# Patient Record
Sex: Male | Born: 1960 | Race: White | Hispanic: No | Marital: Married | Smoking: Never smoker
Health system: Southern US, Community
[De-identification: ages and names within clinical notes are randomized; demographics above are authoritative.]

## PROBLEM LIST (undated history)

## (undated) DIAGNOSIS — Z9221 Personal history of antineoplastic chemotherapy: Secondary | ICD-10-CM

## (undated) DIAGNOSIS — T4145XA Adverse effect of unspecified anesthetic, initial encounter: Secondary | ICD-10-CM

## (undated) DIAGNOSIS — N189 Chronic kidney disease, unspecified: Secondary | ICD-10-CM

## (undated) DIAGNOSIS — Z941 Heart transplant status: Secondary | ICD-10-CM

## (undated) DIAGNOSIS — C4490 Unspecified malignant neoplasm of skin, unspecified: Secondary | ICD-10-CM

## (undated) DIAGNOSIS — C8190 Hodgkin lymphoma, unspecified, unspecified site: Secondary | ICD-10-CM

## (undated) DIAGNOSIS — T8859XA Other complications of anesthesia, initial encounter: Secondary | ICD-10-CM

## (undated) DIAGNOSIS — F329 Major depressive disorder, single episode, unspecified: Secondary | ICD-10-CM

## (undated) DIAGNOSIS — M5136 Other intervertebral disc degeneration, lumbar region: Secondary | ICD-10-CM

## (undated) DIAGNOSIS — C189 Malignant neoplasm of colon, unspecified: Secondary | ICD-10-CM

## (undated) DIAGNOSIS — E039 Hypothyroidism, unspecified: Secondary | ICD-10-CM

## (undated) DIAGNOSIS — Z87442 Personal history of urinary calculi: Secondary | ICD-10-CM

## (undated) DIAGNOSIS — L989 Disorder of the skin and subcutaneous tissue, unspecified: Secondary | ICD-10-CM

## (undated) DIAGNOSIS — I4891 Unspecified atrial fibrillation: Secondary | ICD-10-CM

## (undated) DIAGNOSIS — M109 Gout, unspecified: Secondary | ICD-10-CM

## (undated) DIAGNOSIS — IMO0001 Reserved for inherently not codable concepts without codable children: Secondary | ICD-10-CM

## (undated) DIAGNOSIS — F32A Depression, unspecified: Secondary | ICD-10-CM

## (undated) DIAGNOSIS — K219 Gastro-esophageal reflux disease without esophagitis: Secondary | ICD-10-CM

## (undated) DIAGNOSIS — Z923 Personal history of irradiation: Secondary | ICD-10-CM

## (undated) DIAGNOSIS — M51369 Other intervertebral disc degeneration, lumbar region without mention of lumbar back pain or lower extremity pain: Secondary | ICD-10-CM

## (undated) DIAGNOSIS — J189 Pneumonia, unspecified organism: Secondary | ICD-10-CM

## (undated) DIAGNOSIS — R5382 Chronic fatigue, unspecified: Secondary | ICD-10-CM

## (undated) DIAGNOSIS — M5126 Other intervertebral disc displacement, lumbar region: Secondary | ICD-10-CM

## (undated) DIAGNOSIS — D494 Neoplasm of unspecified behavior of bladder: Secondary | ICD-10-CM

## (undated) HISTORY — PX: CARDIAC CATHETERIZATION: SHX172

## (undated) HISTORY — DX: Depression, unspecified: F32.A

## (undated) HISTORY — DX: Hypothyroidism, unspecified: E03.9

## (undated) HISTORY — DX: Pneumonia, unspecified organism: J18.9

## (undated) HISTORY — PX: PORTACATH PLACEMENT: SHX2246

## (undated) HISTORY — PX: OTHER SURGICAL HISTORY: SHX169

## (undated) HISTORY — DX: Unspecified atrial fibrillation: I48.91

## (undated) HISTORY — DX: Gastro-esophageal reflux disease without esophagitis: K21.9

## (undated) HISTORY — PX: TONSILLECTOMY: SUR1361

## (undated) HISTORY — DX: Hodgkin lymphoma, unspecified, unspecified site: C81.90

## (undated) HISTORY — PX: ESOPHAGOGASTRODUODENOSCOPY: SHX1529

## (undated) HISTORY — PX: EXPLORATORY LAPAROTOMY: SUR591

## (undated) HISTORY — DX: Major depressive disorder, single episode, unspecified: F32.9

---
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
## 1980-12-01 DIAGNOSIS — Z923 Personal history of irradiation: Secondary | ICD-10-CM

## 1980-12-01 HISTORY — PX: EXPLORATORY LAPAROTOMY: SUR591

## 1980-12-01 HISTORY — DX: Personal history of irradiation: Z92.3

## 1998-09-14 ENCOUNTER — Ambulatory Visit (HOSPITAL_COMMUNITY): Admission: RE | Admit: 1998-09-14 | Discharge: 1998-09-14 | Payer: Self-pay | Admitting: Cardiology

## 1998-12-11 ENCOUNTER — Ambulatory Visit (HOSPITAL_COMMUNITY): Admission: RE | Admit: 1998-12-11 | Discharge: 1998-12-11 | Payer: Self-pay | Admitting: Family Medicine

## 1999-06-14 ENCOUNTER — Encounter (INDEPENDENT_AMBULATORY_CARE_PROVIDER_SITE_OTHER): Payer: Self-pay | Admitting: Specialist

## 1999-06-14 ENCOUNTER — Other Ambulatory Visit: Admission: RE | Admit: 1999-06-14 | Discharge: 1999-06-14 | Payer: Self-pay | Admitting: Gastroenterology

## 2000-05-04 ENCOUNTER — Ambulatory Visit (HOSPITAL_COMMUNITY): Admission: RE | Admit: 2000-05-04 | Discharge: 2000-05-04 | Payer: Self-pay | Admitting: Internal Medicine

## 2000-05-13 ENCOUNTER — Ambulatory Visit (HOSPITAL_COMMUNITY): Admission: RE | Admit: 2000-05-13 | Discharge: 2000-05-13 | Payer: Self-pay | Admitting: Internal Medicine

## 2000-12-18 ENCOUNTER — Ambulatory Visit (HOSPITAL_COMMUNITY): Admission: RE | Admit: 2000-12-18 | Discharge: 2000-12-18 | Payer: Self-pay | Admitting: Cardiology

## 2000-12-18 ENCOUNTER — Encounter: Payer: Self-pay | Admitting: Cardiology

## 2002-07-14 ENCOUNTER — Encounter: Payer: Self-pay | Admitting: Cardiology

## 2002-07-14 ENCOUNTER — Inpatient Hospital Stay (HOSPITAL_COMMUNITY): Admission: AD | Admit: 2002-07-14 | Discharge: 2002-07-15 | Payer: Self-pay | Admitting: Cardiology

## 2002-08-25 ENCOUNTER — Ambulatory Visit (HOSPITAL_COMMUNITY): Admission: RE | Admit: 2002-08-25 | Discharge: 2002-08-26 | Payer: Self-pay | Admitting: Internal Medicine

## 2002-08-26 ENCOUNTER — Encounter: Payer: Self-pay | Admitting: Internal Medicine

## 2002-11-15 ENCOUNTER — Ambulatory Visit: Admission: RE | Admit: 2002-11-15 | Discharge: 2002-11-15 | Payer: Self-pay | Admitting: Cardiology

## 2002-11-22 ENCOUNTER — Ambulatory Visit: Admission: RE | Admit: 2002-11-22 | Discharge: 2002-11-22 | Payer: Self-pay | Admitting: Gastroenterology

## 2003-02-21 ENCOUNTER — Ambulatory Visit: Admission: RE | Admit: 2003-02-21 | Discharge: 2003-02-21 | Payer: Self-pay | Admitting: Gastroenterology

## 2003-03-02 ENCOUNTER — Ambulatory Visit: Admission: RE | Admit: 2003-03-02 | Discharge: 2003-03-02 | Payer: Self-pay | Admitting: Cardiology

## 2003-11-23 ENCOUNTER — Ambulatory Visit: Admission: RE | Admit: 2003-11-23 | Discharge: 2003-11-23 | Payer: Self-pay | Admitting: Pulmonary Disease

## 2003-12-18 ENCOUNTER — Encounter (INDEPENDENT_AMBULATORY_CARE_PROVIDER_SITE_OTHER): Payer: Self-pay | Admitting: Specialist

## 2003-12-18 ENCOUNTER — Ambulatory Visit (HOSPITAL_COMMUNITY): Admission: RE | Admit: 2003-12-18 | Discharge: 2003-12-18 | Payer: Self-pay | Admitting: Surgery

## 2004-10-03 ENCOUNTER — Ambulatory Visit: Payer: Self-pay

## 2004-10-23 ENCOUNTER — Ambulatory Visit: Payer: Self-pay | Admitting: Family Medicine

## 2004-10-30 ENCOUNTER — Ambulatory Visit: Payer: Self-pay | Admitting: Internal Medicine

## 2004-11-07 ENCOUNTER — Ambulatory Visit: Payer: Self-pay | Admitting: Gastroenterology

## 2004-11-08 ENCOUNTER — Ambulatory Visit: Payer: Self-pay | Admitting: Gastroenterology

## 2004-12-03 ENCOUNTER — Ambulatory Visit: Payer: Self-pay | Admitting: *Deleted

## 2004-12-18 ENCOUNTER — Ambulatory Visit: Payer: Self-pay | Admitting: Gastroenterology

## 2004-12-31 ENCOUNTER — Ambulatory Visit: Payer: Self-pay | Admitting: Internal Medicine

## 2005-01-09 ENCOUNTER — Ambulatory Visit: Payer: Self-pay | Admitting: Pulmonary Disease

## 2005-01-09 ENCOUNTER — Ambulatory Visit: Admission: RE | Admit: 2005-01-09 | Discharge: 2005-01-09 | Payer: Self-pay | Admitting: Pulmonary Disease

## 2005-02-03 ENCOUNTER — Ambulatory Visit: Payer: Self-pay

## 2005-02-05 ENCOUNTER — Ambulatory Visit: Payer: Self-pay | Admitting: Internal Medicine

## 2005-02-07 ENCOUNTER — Ambulatory Visit: Payer: Self-pay | Admitting: Internal Medicine

## 2005-02-07 ENCOUNTER — Observation Stay (HOSPITAL_COMMUNITY): Admission: AD | Admit: 2005-02-07 | Discharge: 2005-02-07 | Payer: Self-pay | Admitting: Internal Medicine

## 2005-02-19 ENCOUNTER — Ambulatory Visit: Payer: Self-pay | Admitting: Internal Medicine

## 2005-02-19 ENCOUNTER — Ambulatory Visit: Payer: Self-pay

## 2005-03-24 ENCOUNTER — Ambulatory Visit: Payer: Self-pay

## 2005-04-01 ENCOUNTER — Ambulatory Visit: Payer: Self-pay | Admitting: Internal Medicine

## 2005-04-02 ENCOUNTER — Ambulatory Visit: Payer: Self-pay | Admitting: Internal Medicine

## 2005-04-08 ENCOUNTER — Ambulatory Visit (HOSPITAL_COMMUNITY): Admission: AD | Admit: 2005-04-08 | Discharge: 2005-04-08 | Payer: Self-pay | Admitting: Internal Medicine

## 2005-04-21 ENCOUNTER — Ambulatory Visit: Payer: Self-pay

## 2005-09-16 ENCOUNTER — Ambulatory Visit: Payer: Self-pay | Admitting: Internal Medicine

## 2005-12-19 ENCOUNTER — Ambulatory Visit: Payer: Self-pay | Admitting: Internal Medicine

## 2006-02-11 ENCOUNTER — Ambulatory Visit: Payer: Self-pay | Admitting: Family Medicine

## 2006-02-18 ENCOUNTER — Ambulatory Visit: Payer: Self-pay | Admitting: Family Medicine

## 2006-04-09 ENCOUNTER — Encounter: Admission: RE | Admit: 2006-04-09 | Discharge: 2006-04-09 | Payer: Self-pay | Admitting: Family Medicine

## 2006-04-09 ENCOUNTER — Ambulatory Visit: Payer: Self-pay | Admitting: Family Medicine

## 2006-04-13 ENCOUNTER — Ambulatory Visit: Payer: Self-pay | Admitting: Family Medicine

## 2006-06-18 ENCOUNTER — Ambulatory Visit: Payer: Self-pay

## 2006-12-29 ENCOUNTER — Ambulatory Visit: Payer: Self-pay | Admitting: Cardiology

## 2007-01-19 ENCOUNTER — Ambulatory Visit: Payer: Self-pay | Admitting: Gastroenterology

## 2007-01-20 ENCOUNTER — Ambulatory Visit: Payer: Self-pay | Admitting: Internal Medicine

## 2007-01-22 ENCOUNTER — Ambulatory Visit: Payer: Self-pay | Admitting: Family Medicine

## 2007-02-09 ENCOUNTER — Ambulatory Visit: Payer: Self-pay

## 2007-02-09 ENCOUNTER — Encounter: Payer: Self-pay | Admitting: Cardiology

## 2007-02-09 ENCOUNTER — Ambulatory Visit: Payer: Self-pay | Admitting: Internal Medicine

## 2007-03-02 ENCOUNTER — Ambulatory Visit: Payer: Self-pay | Admitting: Family Medicine

## 2007-03-02 LAB — CONVERTED CEMR LAB
Albumin: 3.8 g/dL (ref 3.5–5.2)
Alkaline Phosphatase: 54 units/L (ref 39–117)
BUN: 24 mg/dL — ABNORMAL HIGH (ref 6–23)
Calcium: 9.1 mg/dL (ref 8.4–10.5)
Cholesterol: 206 mg/dL (ref 0–200)
Eosinophils Absolute: 1 10*3/uL — ABNORMAL HIGH (ref 0.0–0.6)
Glucose, Bld: 104 mg/dL — ABNORMAL HIGH (ref 70–99)
HCT: 47.7 % (ref 39.0–52.0)
Lymphocytes Relative: 26.5 % (ref 12.0–46.0)
MCHC: 33.9 g/dL (ref 30.0–36.0)
MCV: 93 fL (ref 78.0–100.0)
Monocytes Absolute: 0.8 10*3/uL — ABNORMAL HIGH (ref 0.2–0.7)
Monocytes Relative: 9.9 % (ref 3.0–11.0)
Neutro Abs: 3.9 10*3/uL (ref 1.4–7.7)
Neutrophils Relative %: 50.8 % (ref 43.0–77.0)
Platelets: 246 10*3/uL (ref 150–400)
Potassium: 3.9 meq/L (ref 3.5–5.1)
RBC: 5.13 M/uL (ref 4.22–5.81)
Total Bilirubin: 0.9 mg/dL (ref 0.3–1.2)
Total CHOL/HDL Ratio: 5.5
Triglycerides: 126 mg/dL (ref 0–149)
WBC: 7.8 10*3/uL (ref 4.5–10.5)

## 2007-03-09 ENCOUNTER — Ambulatory Visit: Payer: Self-pay | Admitting: Family Medicine

## 2007-05-10 ENCOUNTER — Ambulatory Visit: Payer: Self-pay | Admitting: Family Medicine

## 2007-08-14 DIAGNOSIS — J069 Acute upper respiratory infection, unspecified: Secondary | ICD-10-CM | POA: Insufficient documentation

## 2007-08-19 ENCOUNTER — Telehealth (INDEPENDENT_AMBULATORY_CARE_PROVIDER_SITE_OTHER): Payer: Self-pay | Admitting: *Deleted

## 2007-08-20 ENCOUNTER — Ambulatory Visit: Payer: Self-pay | Admitting: Family Medicine

## 2007-08-20 DIAGNOSIS — I509 Heart failure, unspecified: Secondary | ICD-10-CM | POA: Insufficient documentation

## 2007-08-26 ENCOUNTER — Ambulatory Visit: Payer: Self-pay | Admitting: Family Medicine

## 2007-08-26 DIAGNOSIS — D235 Other benign neoplasm of skin of trunk: Secondary | ICD-10-CM

## 2007-08-31 ENCOUNTER — Ambulatory Visit: Payer: Self-pay

## 2007-09-08 ENCOUNTER — Encounter: Admission: RE | Admit: 2007-09-08 | Discharge: 2007-09-08 | Payer: Self-pay | Admitting: Sports Medicine

## 2007-09-22 ENCOUNTER — Ambulatory Visit: Payer: Self-pay

## 2007-09-23 ENCOUNTER — Ambulatory Visit: Payer: Self-pay | Admitting: Family Medicine

## 2007-09-23 DIAGNOSIS — H8110 Benign paroxysmal vertigo, unspecified ear: Secondary | ICD-10-CM

## 2008-03-15 ENCOUNTER — Ambulatory Visit: Payer: Self-pay | Admitting: Family Medicine

## 2008-03-15 DIAGNOSIS — F329 Major depressive disorder, single episode, unspecified: Secondary | ICD-10-CM

## 2008-03-15 DIAGNOSIS — F3289 Other specified depressive episodes: Secondary | ICD-10-CM | POA: Insufficient documentation

## 2008-04-05 ENCOUNTER — Encounter (INDEPENDENT_AMBULATORY_CARE_PROVIDER_SITE_OTHER): Payer: Self-pay | Admitting: *Deleted

## 2008-05-09 ENCOUNTER — Encounter: Payer: Self-pay | Admitting: *Deleted

## 2008-05-09 ENCOUNTER — Telehealth: Payer: Self-pay | Admitting: *Deleted

## 2008-05-12 ENCOUNTER — Ambulatory Visit: Payer: Self-pay | Admitting: Family Medicine

## 2008-05-12 LAB — CONVERTED CEMR LAB
ALT: 15 units/L (ref 0–53)
BUN: 18 mg/dL (ref 6–23)
Basophils Absolute: 0 10*3/uL (ref 0.0–0.1)
CO2: 31 meq/L (ref 19–32)
Cholesterol: 174 mg/dL (ref 0–200)
Eosinophils Absolute: 0.1 10*3/uL (ref 0.0–0.7)
Eosinophils Relative: 2.6 % (ref 0.0–5.0)
GFR calc Af Amer: 117 mL/min
Glucose, Bld: 102 mg/dL — ABNORMAL HIGH (ref 70–99)
Glucose, Urine, Semiquant: NEGATIVE
HDL: 31.1 mg/dL — ABNORMAL LOW (ref >39.0)
Hemoglobin: 14.8 g/dL (ref 13.0–17.0)
LDL Cholesterol: 131 mg/dL — ABNORMAL HIGH (ref 0–99)
Lymphocytes Relative: 28.7 % (ref 12.0–46.0)
MCV: 95.8 fL (ref 78.0–100.0)
Monocytes Absolute: 0.6 10*3/uL (ref 0.1–1.0)
Platelets: 295 10*3/uL (ref 150–400)
RBC: 4.61 M/uL (ref 4.22–5.81)
RDW: 13 % (ref 11.5–14.6)
Total Bilirubin: 1.1 mg/dL (ref 0.3–1.2)
Total CHOL/HDL Ratio: 5.6
Total Protein: 6.7 g/dL (ref 6.0–8.3)
WBC Urine, dipstick: NEGATIVE
WBC: 5.7 10*3/uL (ref 4.5–10.5)
pH: 7

## 2008-05-19 ENCOUNTER — Ambulatory Visit: Payer: Self-pay | Admitting: Family Medicine

## 2008-07-04 ENCOUNTER — Telehealth: Payer: Self-pay | Admitting: Family Medicine

## 2008-10-05 ENCOUNTER — Telehealth: Payer: Self-pay | Admitting: Family Medicine

## 2008-10-18 ENCOUNTER — Ambulatory Visit: Payer: Self-pay | Admitting: Internal Medicine

## 2008-10-30 ENCOUNTER — Ambulatory Visit: Payer: Self-pay | Admitting: Family Medicine

## 2008-11-03 ENCOUNTER — Ambulatory Visit: Payer: Self-pay | Admitting: Internal Medicine

## 2009-01-03 ENCOUNTER — Telehealth: Payer: Self-pay | Admitting: *Deleted

## 2009-02-23 ENCOUNTER — Encounter: Payer: Self-pay | Admitting: Internal Medicine

## 2009-03-06 ENCOUNTER — Encounter: Payer: Self-pay | Admitting: Internal Medicine

## 2009-03-06 ENCOUNTER — Ambulatory Visit: Payer: Self-pay | Admitting: Internal Medicine

## 2009-04-10 ENCOUNTER — Encounter: Payer: Self-pay | Admitting: Internal Medicine

## 2009-06-07 ENCOUNTER — Ambulatory Visit: Payer: Self-pay

## 2009-06-07 ENCOUNTER — Encounter: Payer: Self-pay | Admitting: Internal Medicine

## 2009-07-18 ENCOUNTER — Telehealth: Payer: Self-pay | Admitting: Family Medicine

## 2009-07-23 ENCOUNTER — Telehealth: Payer: Self-pay | Admitting: Family Medicine

## 2009-08-16 ENCOUNTER — Encounter (INDEPENDENT_AMBULATORY_CARE_PROVIDER_SITE_OTHER): Payer: Self-pay | Admitting: *Deleted

## 2009-08-30 ENCOUNTER — Telehealth: Payer: Self-pay | Admitting: Family Medicine

## 2009-09-03 ENCOUNTER — Ambulatory Visit: Payer: Self-pay

## 2009-09-03 ENCOUNTER — Encounter: Payer: Self-pay | Admitting: Internal Medicine

## 2010-05-22 ENCOUNTER — Ambulatory Visit: Payer: Self-pay | Admitting: Internal Medicine

## 2010-06-04 ENCOUNTER — Ambulatory Visit: Payer: Self-pay | Admitting: Internal Medicine

## 2010-06-04 DIAGNOSIS — I472 Ventricular tachycardia, unspecified: Secondary | ICD-10-CM | POA: Insufficient documentation

## 2010-06-04 DIAGNOSIS — I5022 Chronic systolic (congestive) heart failure: Secondary | ICD-10-CM

## 2010-06-05 ENCOUNTER — Telehealth: Payer: Self-pay | Admitting: Internal Medicine

## 2010-06-05 ENCOUNTER — Encounter: Payer: Self-pay | Admitting: Internal Medicine

## 2010-06-06 ENCOUNTER — Ambulatory Visit: Payer: Self-pay | Admitting: Internal Medicine

## 2010-06-07 LAB — CONVERTED CEMR LAB
BUN: 23 mg/dL (ref 6–23)
Basophils Relative: 0.6 % (ref 0.0–3.0)
Calcium: 9.4 mg/dL (ref 8.4–10.5)
Creatinine, Ser: 1.1 mg/dL (ref 0.4–1.5)
Eosinophils Relative: 3.8 % (ref 0.0–5.0)
Glucose, Bld: 89 mg/dL (ref 70–99)
Glucose, Bld: 90 mg/dL (ref 70–99)
HCT: 46 % (ref 39.0–52.0)
Hemoglobin: 15.6 g/dL (ref 13.0–17.0)
INR: 0.9 (ref 0.8–1.0)
MCV: 96.1 fL (ref 78.0–100.0)
Monocytes Relative: 9.8 % (ref 3.0–12.0)
Neutro Abs: 6.1 10*3/uL (ref 1.4–7.7)
Neutrophils Relative %: 64.6 % (ref 43.0–77.0)
Potassium: 4.6 meq/L (ref 3.5–5.1)
Pro B Natriuretic peptide (BNP): 197 pg/mL — ABNORMAL HIGH (ref 0.0–100.0)
Prothrombin Time: 10 s (ref 9.7–11.8)
RDW: 14.8 % — ABNORMAL HIGH (ref 11.5–14.6)
Sodium: 139 meq/L (ref 135–145)
WBC: 9.5 10*3/uL (ref 4.5–10.5)
aPTT: 29.4 s — ABNORMAL HIGH (ref 21.7–28.8)

## 2010-06-10 ENCOUNTER — Ambulatory Visit: Payer: Self-pay | Admitting: Internal Medicine

## 2010-06-10 ENCOUNTER — Ambulatory Visit (HOSPITAL_COMMUNITY): Admission: RE | Admit: 2010-06-10 | Discharge: 2010-06-10 | Payer: Self-pay | Admitting: Internal Medicine

## 2010-06-13 ENCOUNTER — Encounter: Payer: Self-pay | Admitting: Internal Medicine

## 2010-06-18 ENCOUNTER — Telehealth (INDEPENDENT_AMBULATORY_CARE_PROVIDER_SITE_OTHER): Payer: Self-pay | Admitting: Physician Assistant

## 2010-06-20 ENCOUNTER — Ambulatory Visit: Payer: Self-pay

## 2010-06-20 ENCOUNTER — Ambulatory Visit: Payer: Self-pay | Admitting: Internal Medicine

## 2010-06-20 DIAGNOSIS — R0602 Shortness of breath: Secondary | ICD-10-CM

## 2010-07-15 LAB — CONVERTED CEMR LAB
BUN: 29 mg/dL — ABNORMAL HIGH (ref 6–23)
Basophils Absolute: 0.1 10*3/uL (ref 0.0–0.1)
Eosinophils Absolute: 0.4 10*3/uL (ref 0.0–0.7)
Eosinophils Relative: 4.5 % (ref 0.0–5.0)
Glucose, Bld: 108 mg/dL — ABNORMAL HIGH (ref 70–99)
Lymphocytes Relative: 19.6 % (ref 12.0–46.0)
MCV: 94.6 fL (ref 78.0–100.0)
Monocytes Absolute: 0.9 10*3/uL (ref 0.1–1.0)
Monocytes Relative: 10.6 % (ref 3.0–12.0)
Neutro Abs: 5.6 10*3/uL (ref 1.4–7.7)
RBC: 4.72 M/uL (ref 4.22–5.81)

## 2010-09-02 ENCOUNTER — Encounter: Payer: Self-pay | Admitting: Internal Medicine

## 2010-09-02 ENCOUNTER — Ambulatory Visit: Payer: Self-pay | Admitting: Internal Medicine

## 2010-09-03 ENCOUNTER — Ambulatory Visit: Payer: Self-pay | Admitting: Internal Medicine

## 2010-09-16 ENCOUNTER — Telehealth: Payer: Self-pay | Admitting: Internal Medicine

## 2010-11-01 ENCOUNTER — Encounter: Admission: RE | Admit: 2010-11-01 | Discharge: 2010-11-01 | Payer: Self-pay | Admitting: Sports Medicine

## 2010-12-05 ENCOUNTER — Ambulatory Visit: Admit: 2010-12-05 | Payer: Self-pay | Admitting: Internal Medicine

## 2010-12-05 ENCOUNTER — Encounter: Payer: Self-pay | Admitting: Internal Medicine

## 2010-12-05 ENCOUNTER — Ambulatory Visit
Admission: RE | Admit: 2010-12-05 | Discharge: 2010-12-05 | Payer: Self-pay | Source: Home / Self Care | Attending: Internal Medicine | Admitting: Internal Medicine

## 2011-01-02 NOTE — Assessment & Plan Note (Signed)
Summary: to estab. saf  Medications Added SYNTHROID 150 MCG TABS (LEVOTHYROXINE SODIUM) Take 1 tablet by mouth every morning FUROSEMIDE 40 MG TABS (FUROSEMIDE) Take 1 tablet by mouth two times a day CARVEDILOL 25 MG TABS (CARVEDILOL) 1/2 tab two times a day ALEVE 220 MG TABS (NAPROXEN SODIUM) 1 tab at bedtime for back pain TYLENOL EXTRA STRENGTH 500 MG TABS (ACETAMINOPHEN) 1 tab at bedtime for back pain        Visit Type:  EC6 Primary Provider:  Dr. Odette Fraction  CC:  no cardiac complaints today....pt sees Dr. Virl Axe....  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 related to chemo/XRT) with EF 25% s/p CRT-D previously followed by Dr. Melvern Banker and now referred to establish long-term care.  PMHx notable for Hodgkins lymphona (treated age 60-23) Treated with high dose chemo - including adriamycin and XRT. Also has AF, VT, PUD and asthma. Developed NICM shortly thereafter. Initial EF 13%. Seen at [facility] transplant clinic much later and felt to be doing too well.   Last echo at [facility] recently and: EF < 25% mild MR. Myoview 4/07 EF 35% no ischemia or scar. Recently underwent generator changeout of his ICD with Dr. [name].  Very active walks 2.5 miles per day on TM at 72mph/3%grade. No difficulty. Has good days and bad days. On bad days, may only walk 10 mins at 2.5 mph with no incline. Happens about 25% at time. Does require rest breaks during the day.  Previously in HF-ACTION with pVO2 in mid to low 20s.  Volume status well controlled. No significant edema, orthopnea, PND. Very compliant with meds. Has not been able to tolerate spironolactone or Inspra due to painful gynecomastia requiring surgery. BP runs low but not currently symptomatic. Has not tolerated medication titration beyond this point.          Current Medications (verified): 1)  Accupril 10 Mg Tabs (Quinapril Hcl) .Marland Kitchen.. 1 By Mouth Two Times A Day 2)  Aciphex 20 Mg Tbec (Rabeprazole Sodium)  .... One Time A Day 3)  Synthroid 150 Mcg Tabs (Levothyroxine Sodium) .... Take 1 Tablet By Mouth Every Morning 4)  Furosemide 40 Mg Tabs (Furosemide) .... 3 By Mouth Daily 5)  Carvedilol 25 Mg Tabs (Carvedilol) .... 1/2 Tab Two Times A Day 6)  Crestor 5 Mg Tabs (Rosuvastatin Calcium) .... Take One Tablet Once Daily 7)  Aleve 220 Mg Tabs (Naproxen Sodium) .Marland Kitchen.. 1 Tab At Bedtime For Back Pain 8)  Tylenol Extra Strength 500 Mg Tabs (Acetaminophen) .Marland Kitchen.. 1 Tab At Bedtime For Back Pain  Allergies: 1)  ! Penicillin 2)  ! * Milk  Past History:  Past Medical History: Last updated: 09/02/2010 Congestive heart failure Hodkins lymphoma IIIB 1980 a-fib gerd asthma Gallbladder removed pacemaker- [employer] COGNIS device 2011 revision hypothyroidism recurrent pneumonia sedf. rad.tx for lymphoma Depression  Past Surgical History: Last updated: 09/02/2010 neck biopses x5 Laparotomy-exploratory Permanent pacemaker- [employer] COGNIS device/2011 Gynemastia portacath Tonsillectomy  Family History: Last updated: 03/15/2008 father mid 23s, history of depression, hypertension, insomnia mother, mid 47s, retired Marine scientist, history of hypothyroidism, insomnia, degenerative disease, and how the hernia and obesity 3 brothers in good health.  Sister, in good health, except she has joint problems  Social History: Last updated: 09/02/2010 Married Never Smoked Alcohol use-no Regular exercise-yes primary caregiver for 68-year-old twin's wife is the working spouse  Risk Factors: Exercise: yes (08/20/2007)  Risk Factors: Smoking Status: never (08/20/2007)  Family  History: Reviewed history from 03/15/2008 and no changes required. father mid 55s, history of depression, hypertension, insomnia mother, mid 39s, retired Marine scientist, history of hypothyroidism, insomnia, degenerative disease, and how the hernia and obesity 3 brothers in good health.  Sister, in good health, except she has joint  problems  Social History: Reviewed history from 03/15/2008 and no changes required. Married Never Smoked Alcohol use-no Regular exercise-yes primary caregiver for 75-year-old twin's wife is the working spouse  Review of Systems       As per HPI and past medical history; otherwise all systems negative.   Vital Signs:  Patient profile:   50 year old male Height:      72 inches Weight:      219.4 pounds BMI:     29.86 Pulse rate:   72 / minute Pulse rhythm:   regular BP sitting:   84 / 54  (left arm) Cuff size:   large  Vitals Entered By: Julaine Hua, CMA (September 02, 2010 10:42 AM)  Physical Exam  General:  Gen: well appearing. no resp difficulty HEENT: normal Neck: supple. no JVD. Carotids 2+ bilat; no bruits. No lymphadenopathy or thryomegaly appreciated. Cor: PMI nondisplaced. Regular rate & rhythm. No rubs, gallops, murmur. Lungs: clear Abdomen: soft, nontender, nondistended. No hepatosplenomegaly. No bruits or masses. Good bowel sounds. Extremities: no cyanosis, clubbing, rash, edema Neuro: alert & orientedx3, cranial nerves grossly intact. moves all 4 extremities w/o difficulty. affect pleasant     ICD Specifications Following MD:  Virl Axe, MD     ICD Vendor:  [place]     ICD Model Number:  [phone]     ICD Serial Number:  [id number] ICD DOI:  06/10/2010     ICD Implanting MD:  Virl Axe, MD  Lead 1:    Location: RA     DOI: 08/25/2002     Model #: [id number]     Serial #: [id number]     Status: active Lead 2:    Location: RV     DOI: 08/25/2002     Model #: [id number]     Serial #: [id number]     Status: active Lead 3:    Location: LV     DOI: 08/25/2002     Model #: [id number]     Serial #: [id number]     Status: active  Indications::  CHF  Explantation Comments: 06/10/10 [employer] Contak [id number] explanted  ICD Follow Up ICD Dependent:  No      Episodes Coumadin:  No  Brady Parameters Mode DDD     Lower Rate Limit:  40     Upper Rate Limit 120 PAV 80      Sensed AV Delay:  80  Tachy Zones VF:  220     VT:  200     VT1:  140     Impression & Recommendations:  Problem # 1:  SYSTOLIC HEART FAILURE, CHRONIC (ICD-428.22) Doing well. Currently NYHA class I-II. Volume status looks good. Med titration limited by BP. On acceptable doses as is. Will try to change lasix to 40 two times a day and see how he does. Will plan CPX test to objectively define functional capcity and risk stratify for advanced therapies.   Other Orders: [facility]X Test at [facility] (CPX Test)  Patient Instructions: 1)  Decrease Furosmide to 40mg  two times a day  2)  Your physician has recommended that you have a cardiopulmonary stress test (CPX).  CPX testing is  a non-invasive measurement of heart and lung function. It replaces a traditional treadmill stress test. This type of test provides a tremendous amount of information that relates not only to your present condition but also for future outcomes.  This test combines measurements of your ventilation, respiratory gas exchange in the lungs, electrocardiogram (EKG), blood pressure and physical response before, during, and following an exercise protocol. 3)  Follow up in 3 months  Prevention & Chronic Care Immunizations   Influenza vaccine: Not documented    Tetanus booster: Not documented    Pneumococcal vaccine: Not documented  Other Screening   Smoking status: never  (08/20/2007)  Lipids   Total Cholesterol: 174  (05/12/2008)   LDL: 131  (05/12/2008)   LDL Direct: 152.5  (03/02/2007)   HDL: 31.1  (05/12/2008)   Triglycerides: 60  (05/12/2008)

## 2011-01-02 NOTE — Assessment & Plan Note (Signed)
Summary: device beeping      Allergies Added:   Current Medications (verified): 1)  Accupril 10 Mg Tabs (Quinapril Hcl) .Marland Kitchen.. 1 By Mouth Two Times A Day 2)  Aciphex 20 Mg Tbec (Rabeprazole Sodium) .... Two Times A Day 3)  Synthroid 150 Mcg Tabs (Levothyroxine Sodium) .... Take 1 Tablet By Mouth Every Morning 4)  Furosemide 40 Mg Tabs (Furosemide) .... Take One Tablet By Mouth Three Times A Day 5)  Coreg 25 Mg Tabs (Carvedilol) .... Two Times A Day 6)  Fish Oil  Oil (Fish Oil) .... Once Daily  Allergies (verified): 1)  ! Penicillin 2)  ! * Milk    ICD Specifications Following MD:  Virl Axe, MD     ICD Vendor:  [facility] [employer]     ICD Model Number:  [phone]     ICD Serial Number:  [id number] ICD DOI:  02/07/2005     ICD Implanting MD:  Virl Axe, MD  Lead 1:    Location: RA     DOI: 08/25/2002     Model #: [id number]     Serial #: [id number]     Status: active Lead 2:    Location: RV     DOI: 08/25/2002     Model #: [id number]     Serial #: [id number]     Status: active Lead 3:    Location: LV     DOI: 08/25/2002     Model #: [id number]     Serial #: [id number]     Status: active  Indications::  CHF   ICD Follow Up Battery Voltage:  ERI V     ICD Dependent:  No      Episodes Coumadin:  No  Brady Parameters Mode DDD     Lower Rate Limit:  40     Upper Rate Limit 120 PAV 80     Sensed AV Delay:  80  Tachy Zones VF:  220     VT:  200     VT1:  140     Tech Comments:  ICD REACHED ERI--PT SCHEDULED W/SK FOR 06-04-10 @ 1015.  Shelly Bombard  May 23, 2010 10:34 AM

## 2011-01-02 NOTE — Progress Notes (Signed)
   Phone Note Call from Patient   Call For: Dr Virl Axe Reason for Call: Acute Illness Summary of Call: Glenn Gonzalez rec'd a new CRT device 7-11. No wt gain but increased DOE and fatigue. HR is 80s, unusual for him. BP does not read but SBP is 70s routinely and does not register on his machine. He refuses ER eval but requests ofc visit in am. Advised him to call 911 and come in as needed, o/w will call him in am for appt/lab wk. Initial call taken by: Bradly Bienenstock PA-C,  June 18, 2010 5:43 PM

## 2011-01-02 NOTE — Assessment & Plan Note (Signed)
Summary: 1015--icd reached eri/gdt  Medications Added ACIPHEX 20 MG TBEC (RABEPRAZOLE SODIUM) one time a day CARVEDILOL 12.5 MG TABS (CARVEDILOL) Take one tablet by mouth twice a day CRESTOR 5 MG TABS (ROSUVASTATIN CALCIUM) take one tablet once daily      Allergies Added:   CC:  device at eri.  History of Present Illness: Glenn Gonzalez is seen in followup for nonischemic cardiomyopathy occurring in the context of treated cancer and attributed to chemotherapy exposure with congestive heart failure status post CRT implantation. He is now at [facility].  He has noted some increasing weakness of late. He is also noted a 10 pound weight gain over the last 3 weeks; this has been unaccompanied by peripheral edema or dyspnea. He has been involved in a [employer] and has been eating there a lot.  Is a history of ventricular tachycardia which is currently quiescent.     Current Medications (verified): 1)  Accupril 10 Mg Tabs (Quinapril Hcl) .Marland Kitchen.. 1 By Mouth Two Times A Day 2)  Aciphex 20 Mg Tbec (Rabeprazole Sodium) .... One Time A Day 3)  Synthroid 150 Mcg Tabs (Levothyroxine Sodium) .... Take 1 Tablet By Mouth Every Morning 4)  Furosemide 40 Mg Tabs (Furosemide) .... Take One Tablet By Mouth Three Times A Day 5)  Carvedilol 12.5 Mg Tabs (Carvedilol) .... Take One Tablet By Mouth Twice A Day 6)  Fish Oil  Oil (Fish Oil) .... Once Daily 7)  Crestor 5 Mg Tabs (Rosuvastatin Calcium) .... Take One Tablet Once Daily  Allergies (verified): 1)  ! Penicillin 2)  ! * Milk  Past History:  Past Medical History: Last updated: 03/15/2008 Congestive heart failure Hodkins lymphoma IIIB 1980 at fib gerd asthma Gallbladder removed pacemakler hypothyroidism recurrent pneumonia sedf. rad.tx for lymphoma Depression  Past Surgical History: Last updated: 08/20/2007 neck biopses x5 Laparotomy-exploratory Permanent pacemaker Gynemastia portacath Tonsillectomy  Family History: Last  updated: 03/15/2008 father mid 20s, history of depression, hypertension, insomnia mother, mid 81s, retired Marine scientist, history of hypothyroidism, insomnia, degenerative disease, and how the hernia and obesity 3 brothers in good health.  Sister, in good health, except she has joint problems  Social History: Last updated: 03/15/2008 Married Never Smoked Alcohol use-no Regular exercise-yes primary caregiver for 39 1/2-year-old twin's wife is the working spouse  Vital Signs:  Patient profile:   50 year old male Height:      72 inches Weight:      218 pounds BMI:     29.67 Pulse rate:   85 / minute Pulse rhythm:   regular BP sitting:   96 / 68  (left arm) Cuff size:   regular  Vitals Entered By: Doug Sou CMA (June 04, 2010 10:12 AM)  Physical Exam  General:  Well developed, well nourished, in no acute distress. Head:  normal HEENT Neck:  neck veins 6 cm or so with positive HJR otherwise supple with excavation of the right sternocleidomastoid Lungs:  clear  Heart:  regular rate and rhythm with a 2/6 murmur along left sternal border Abdomen:  active bowel sounds and soft Msk:  normal gait Extremities:  no clubbing cyanosis or edema Neurologic:  alert and oriented   EKG  Procedure date:  06/04/2010  Findings:      P. synchronous pacing with an upright QRS in lead V1 and negative axis in the inferior leads   ICD Specifications Following MD:  Virl Axe, MD     ICD Vendor:  [place]  ICD Model Number:  [id number]     ICD Serial Number:  [id number] ICD DOI:  02/07/2005     ICD Implanting MD:  Virl Axe, MD  Lead 1:    Location: RA     DOI: 08/25/2002     Model #: [id number]     Serial #: [id number]     Status: active Lead 2:    Location: RV     DOI: 08/25/2002     Model #: [id number]     Serial #: [id number]     Status: active Lead 3:    Location: LV     DOI: 08/25/2002     Model #: [id number]     Serial #[id number]     Status: active  Indications::  CHF   ICD Follow Up ICD Dependent:   No      Episodes Coumadin:  No  Brady Parameters Mode DDD     Lower Rate Limit:  40     Upper Rate Limit 120 PAV 80     Sensed AV Delay:  80  Tachy Zones VF:  220     VT:  200     VT1:  140     Impression & Recommendations:  Problem # 1:  IMPLANTATION OF DEFIBRILLATOR CRT-D (ICD-V45.02) The patient has reached ERI. Unfortunately his device sits into his group of which a sub-set are associated with rapid deterioration from ERI to EOL.  We are in the process of trying to identify 27 month indicators that would help [place] predict which he improves he is in. He would like to wait till the end of the summer if we can.  We have discussed the risks associated with device generator placement specifically infection and lead fracture.  Problem # 2:  SYSTOLIC HEART FAILURE, CHRONIC (ICD-428.22) the patient's congestive status seems stable. I am bothered by the recent 10 pound weight gain which seems to be far greater than could possibly simply get by in [place] However, he does not manifest significant fluid overload on exam. We'll plan to check a metabolic profile as well as BNP to help guide therapy His updated medication list for this problem includes:    Accupril 10 Mg Tabs (Quinapril hcl) .Marland Kitchen... 1 by mouth two times a day    Furosemide 40 Mg Tabs (Furosemide) .Marland Kitchen... Take one tablet by mouth three times a day    Carvedilol 12.5 Mg Tabs (Carvedilol) .Marland Kitchen... 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 His Coreg is not uptitrated we'll because of problems with orthostatic intolerance. He also has made the observation that dizziness may be elevated by the use of Advil. I cannot understand the mechanism of this I will need to review this His updated medication list for this problem includes:    Accupril 10 Mg Tabs (Quinapril hcl) .Marland Kitchen... 1 by mouth two times a day    Furosemide 40 Mg Tabs (Furosemide)  .Marland Kitchen... Take one tablet by mouth three times a day    Carvedilol 12.5 Mg Tabs (Carvedilol) .Marland Kitchen... Take one tablet by mouth twice a day  Other Orders: EKG w/ Interpretation (93000) TLB-BMP (Basic Metabolic Panel-BMET) (99991111) TLB-BNP (B-Natriuretic Peptide) (83880-BNPR)  Patient Instructions: 1)  Your physician recommends that you return for lab work today. 2)  I will call you this afternoon to schedule ICD gen change.

## 2011-01-02 NOTE — Procedures (Signed)
Summary: wound check  Medications Added FUROSEMIDE 40 MG TABS (FUROSEMIDE) 3 by mouth daily CARVEDILOL 25 MG TABS (CARVEDILOL) Take one tablet by mouth twice a day      Allergies Added:   Current Medications (verified): 1)  Accupril 10 Mg Tabs (Quinapril Hcl) .Marland Kitchen.. 1 By Mouth Two Times A Day 2)  Aciphex 20 Mg Tbec (Rabeprazole Sodium) .... One Time A Day 3)  Synthroid 150 Mcg Tabs (Levothyroxine Sodium) .... Take 1 Tablet By Mouth Every Morning 4)  Furosemide 40 Mg Tabs (Furosemide) .... 3 By Mouth Daily 5)  Carvedilol 25 Mg Tabs (Carvedilol) .... Take One Tablet By Mouth Twice A Day 6)  Crestor 5 Mg Tabs (Rosuvastatin Calcium) .... Take One Tablet Once Daily  Allergies (verified): 1)  ! Penicillin 2)  ! * Milk   ICD Specifications Following MD:  Virl Axe, MD     ICD Vendor:  [facility] [employer]     ICD Model Number:  [phone]     ICD Serial Number:  [id number] ICD DOI:  06/10/2010     ICD Implanting MD:  Virl Axe, MD  Lead 1:    Location: RA     DOI: 08/25/2002     Model #: [id number]     Serial #: [id number]     Status: active Lead 2:    Location: RV     DOI: 08/25/2002     Model #: [id number]     Serial #: [id number]     Status: active Lead 3:    Location: LV     DOI: 08/25/2002     Model #: [id number]     Serial #: [id number]     Status: active  Indications::  CHF  Explantation Comments: 06/10/10 [employer] Contak [id number] explanted  ICD Follow Up Remote Check?  No Charge Time:  8.5 seconds     Battery Est. Longevity:  6.5 YEARS Underlying rhythm:  SR ICD Dependent:  No       ICD Device Measurements Atrium:  Amplitude: 4.1 mV, Impedance: 453 ohms, Threshold: 0.5 V at 0.4 msec Right Ventricle:  Amplitude: 25 mV, Impedance: 571 ohms, Threshold: 0.7 V at 0.4 msec Left Ventricle:  Amplitude: 14.3 mV, Impedance: 609 ohms, Threshold: 0.7 V at 0.8 msec Shock Impedance: 52 ohms   Episodes MS Episodes:  0     Percent Mode Switch:  0     Coumadin:  No Shock:  0     ATP:  0     Nonsustained:   7     Atrial Pacing:  <1%     Ventricular Pacing:  93%  Brady Parameters Mode DDD     Lower Rate Limit:  40     Upper Rate Limit 120 PAV 80     Sensed AV Delay:  80  Tachy Zones VF:  220     VT:  200     VT1:  140     Next Cardiology Appt Due:  08/31/2010 Tech Comments:  No parameter changes.  Steri strips removed.  No edema noted.  There was a 1cm area when I took the strips off that had some bleeding and the skin was not closed so I reapplied 2 more steri strips and instructed Ed to let them fall off at will.  He is was c/o fatigue from the time of surgery up until 7/20 but is feeling better today.  He did have 7 NSVT episodes noted.  His primary Cardiologist was Dr. Melvern Banker but  now he's following with Dr. Rollene Fare.  He's not sure if that's going to be a good fit for him so he's attempting to establish with Dr. Haroldine Laws here.  ROV 3 months with Dr. [name]. Alma Friendly, LPN  July 21, [id number] AM

## 2011-01-02 NOTE — Progress Notes (Signed)
Summary: pt request call  Phone Note Call from Patient Call back at [phone]   Caller: Patient Reason for Call: Talk to Nurse Initial call taken by: Delsa Sale,  September 16, 2010 1:03 PM  Follow-up for Phone Call        pt doesn't want to have CPX , cx'd test Dr Haroldine Laws is aware Kevan Rosebush, RN  September 16, 2010 4:14 PM

## 2011-01-02 NOTE — Cardiovascular Report (Signed)
Summary: Office Visit    Office Visit    Imported By: Sallee Provencal 09/06/2010 16:42:57  _____________________________________________________________________  External Attachment:    Type:   Image     Comment:   External Document

## 2011-01-02 NOTE — Miscellaneous (Signed)
  Clinical Lists Changes  Observations: Added new observation of PAST SURG HX: neck biopses x5 Laparotomy-exploratory Permanent pacemaker- [employer] COGNIS device/2011 Gynemastia portacath Tonsillectomy  (09/02/2010 9:13) Added new observation of PAST MED HX: Congestive heart failure Hodkins lymphoma IIIB 1980 a-fib gerd asthma Gallbladder removed pacemaker- [employer] COGNIS device 2011 revision hypothyroidism recurrent pneumonia sedf. rad.tx for lymphoma Depression  (09/02/2010 9:13)        Allergies: 1)  ! Penicillin 2)  ! * Milk   Past History:  Past Medical History: Congestive heart failure Hodkins lymphoma IIIB 1980 a-fib gerd asthma Gallbladder removed pacemaker- [employer] COGNIS device 2011 revision hypothyroidism recurrent pneumonia sedf. rad.tx for lymphoma Depression  Past Surgical History: neck biopses x5 Laparotomy-exploratory Permanent pacemaker- [employer] COGNIS device/2011 Gynemastia portacath Tonsillectomy

## 2011-01-02 NOTE — Assessment & Plan Note (Signed)
Summary: weight check/jml  Nurse Visit   Vital Signs:  Patient profile:   50 year old male Height:      72 inches Weight:      219 pounds BMI:     29.81 Pulse rate:   80 / minute Pulse rhythm:   regular BP sitting:   82 / 60  (left arm)  Vitals Entered By: Janan Halter, RN, BSN (June 20, 2010 5:24 PM)  Visit Type:  nurse visit Primary Provider:  Dorena Cookey MD  CC:  low BP no energy.  History of Present Illness: unable to update medications because of the wound check office note on hold. Janan Halter, RN, BSN  June 20, 2010 5:34 PM   Allergies (verified): 1)  ! Penicillin 2)  ! * Milk

## 2011-01-02 NOTE — Assessment & Plan Note (Signed)
Summary: device/saf  Medications Added CARVEDILOL 12.5 MG TABS (CARVEDILOL) Take one tablet by mouth twice a day        Visit Type:  device check Primary Provider:  Dorena Cookey MD   History of Present Illness: Glenn Gonzalez is seen in followup for nonischemic cardiomyopathy occurring in the context of treated cancer and attributed to chemotherapy exposure with congestive heart failure status post CRT implantation. He is status post device generator replacement in July.  He is doing quite well without complaints or change in his symptoms. He feels like he is afebrile and her since it is just much better and it is healed well   Current Medications (verified): 1)  Accupril 10 Mg Tabs (Quinapril Hcl) .Marland Kitchen.. 1 By Mouth Two Times A Day 2)  Aciphex 20 Mg Tbec (Rabeprazole Sodium) .... One Time A Day 3)  Synthroid 150 Mcg Tabs (Levothyroxine Sodium) .... Take 1 Tablet By Mouth Every Morning 4)  Furosemide 40 Mg Tabs (Furosemide) .... Take 1 Tablet By Mouth Two Times A Day 5)  Carvedilol 12.5 Mg Tabs (Carvedilol) .... Take One Tablet By Mouth Twice A Day 6)  Crestor 5 Mg Tabs (Rosuvastatin Calcium) .... Take One Tablet Once Daily 7)  Aleve 220 Mg Tabs (Naproxen Sodium) .Marland Kitchen.. 1 Tab At Bedtime For Back Pain 8)  Tylenol Extra Strength 500 Mg Tabs (Acetaminophen) .Marland Kitchen.. 1 Tab At Bedtime For Back Pain  Allergies: 1)  ! Penicillin 2)  ! * Milk  Past History:  Past Medical History: Last updated: 09/02/2010 Congestive heart failure Hodkins lymphoma IIIB 1980 a-fib gerd asthma Gallbladder removed pacemaker- [employer] COGNIS device 2011 revision hypothyroidism recurrent pneumonia sedf. rad.tx for lymphoma Depression  Past Surgical History: Last updated: 09/02/2010 neck biopses x5 Laparotomy-exploratory Permanent pacemaker- [employer] COGNIS device/2011 Gynemastia portacath Tonsillectomy  Family History: Last updated: 03/15/2008 father mid 80s, history of depression,  hypertension, insomnia mother, mid 84s, retired Marine scientist, history of hypothyroidism, insomnia, degenerative disease, and how the hernia and obesity 3 brothers in good health.  Sister, in good health, except she has joint problems  Social History: Last updated: 03/15/2008 Married Never Smoked Alcohol use-no Regular exercise-yes primary caregiver for 50 1/2-year-old twin's wife is the working spouse  Vital Signs:  Patient profile:   50 year old male Height:      72 inches Weight:      220 pounds BMI:     29.95 Pulse rate:   72 / minute Pulse rhythm:   irregular Resp:     18 per minute BP sitting:   90 / 64  (right arm) Cuff size:   large  Vitals Entered By: Sidney Ace (September 03, 2010 2:45 PM)  Physical Exam  General:  The patient was alert and oriented in no acute distress. HEENT Normal.  Neck veins were flat, carotids were brisk.  Lungs were clear.  Heart sounds were regular without murmurs or gallops.  Abdomen was soft with active bowel sounds. There is no clubbing cyanosis or edema. Skin Warm and dry     ICD Specifications Following MD:  Virl Axe, MD     ICD Vendor:  [place] Number:  [phone]     ICD Serial Number:  [id number] ICD DOI:  06/10/2010     ICD Implanting MD:  Virl Axe, MD  Lead 1:    Location: RA     DOI: 08/25/2002     Model #: [id number]  Serial #: [id number]     Status: active Lead 2:    Location: RV     DOI: 08/25/2002     Model #: [id number]     Serial #: [id number]     Status: active Lead 3:    Location: LV     DOI: 08/25/2002     Model #: [id number]     Serial #: [id number]     Status: active  Indications::  CHF  Explantation Comments: 06/10/10 [employer] Contak [id number] explanted  ICD Follow Up Remote Check?  No Charge Time:  8.5 seconds     Battery Est. Longevity:  6.5 years Underlying rhythm:  SR ICD Dependent:  No       ICD Device Measurements Atrium:  Amplitude: 3.8 mV, Impedance: 431 ohms, Threshold: 0.5 V at 0.4  msec Right Ventricle:  Amplitude: 19.7 mV, Impedance: 550 ohms, Threshold: 0.8 V at 0.4 msec Left Ventricle:  Amplitude: 15.6 mV, Impedance: 580 ohms, Threshold: 0.8 V at 0.8 msec Shock Impedance: 49 ohms   Episodes MS Episodes:  0     Percent Mode Switch:  0     Coumadin:  No Shock:  0     ATP:  0     Nonsustained:  24     Atrial Pacing:  <1%     Ventricular Pacing:  99%  Brady Parameters Mode DDD     Lower Rate Limit:  40     Upper Rate Limit 120 PAV 80     Sensed AV Delay:  80  Tachy Zones VF:  220     VT:  200     VT1:  140     Next Cardiology Appt Due:  12/01/2010 Tech Comments:  No parameter changes.  Device function normal.  ROV 3 months clinic. Alma Friendly, LPN  October  4, [id number] 2:50 PM   Impression & Recommendations:  Problem # 1:  CARDIOMYOPATHY, SECONDARY- CHEMOTHERAPY (ICD-425.9) stable on his current meds His updated medication list for this problem includes:    Accupril 10 Mg Tabs (Quinapril hcl) .Marland Kitchen... 1 by mouth two times a day    Furosemide 40 Mg Tabs (Furosemide) .Marland Kitchen... Take 1 tablet by mouth two times a day    Carvedilol 12.5 Mg Tabs (Carvedilol) .Marland Kitchen... Take one tablet by mouth twice a day  Problem # 2:  IMPLANTATION OF DEFIBRILLATOR CRT-D (ICD-V45.02) Device parameters and data were reviewed and no changes were made

## 2011-01-02 NOTE — Cardiovascular Report (Signed)
Summary: Office Visit   Office Visit   Imported By: Sallee Provencal 06/28/2010 10:18:29  _____________________________________________________________________  External Attachment:    Type:   Image     Comment:   External Document

## 2011-01-02 NOTE — Cardiovascular Report (Signed)
Summary: Office Visit   Office Visit   Imported By: Sallee Provencal 12/10/2010 16:24:55  _____________________________________________________________________  External Attachment:    Type:   Image     Comment:   External Document

## 2011-01-02 NOTE — Cardiovascular Report (Signed)
Summary: Pre Op Orders   Pre Op Orders   Imported By: Sallee Provencal 06/11/2010 15:43:27  _____________________________________________________________________  External Attachment:    Type:   Image     Comment:   External Document

## 2011-01-02 NOTE — Miscellaneous (Signed)
Summary: Device change out  Clinical Lists Changes  Observations: Added new observation of ICD IMPL DTE: 06/10/2010 (06/13/2010 14:56) Added new observation of ICD SERL#: [id number]  (06/13/2010 14:56) Added new observation of ICDEXPLCOMM: 06/10/10 [place] [id number] explanted  (06/13/2010 14:56)       ICD Specifications Following MD:  Virl Axe, MD     ICD Vendor:  [place]     ICD Model Number:  H175     ICD Serial Number:  [id number] ICD DOI:  06/10/2010     ICD Implanting MD:  Virl Axe, MD  Lead 1:    Location: RA     DOI: 08/25/2002     Model #: [id number]     Serial #: [id number]     Status: active Lead 2:    Location: RV     DOI: 08/25/2002     Model #: [id number]     Serial #: [id number]     Status: active Lead 3:    Location: LV     DOI: 08/25/2002     Model #: [id number]     Serial #: [id number]     Status: active  Indications::  CHF  Explantation Comments: 06/10/10 [facility] [employer] Contak [id number] explanted  ICD Follow Up ICD Dependent:  No      Episodes Coumadin:  No  Brady Parameters Mode DDD     Lower Rate Limit:  40     Upper Rate Limit 120 PAV 80     Sensed AV Delay:  80  Tachy Zones VF:  220     VT:  200     VT1:  140

## 2011-01-02 NOTE — Progress Notes (Signed)
Summary: ICD gen change   Phone Note Outgoing Call Call back at [facility] Phone [phone]   Call placed by: Chanetta Marshall RN BSN,  June 05, 2010 12:25 PM Summary of Call: Called patient and left message on machine to schedule ICD gen change.  Per [employer], device should be changed out in next 2-3 weeks. Chanetta Marshall RN BSN  June 05, 2010 12:25 PM   Follow-up for Phone Call        Spoke with patient.  ICD gen change scheduled for 7-11 at 7:30 with Dr [name].  Labs 7-7.  Pt aware and agrees. Chanetta Marshall RN BSN  June 05, 2010 4:22 PM

## 2011-01-02 NOTE — Procedures (Signed)
Summary: Cardiology Device Clinic   Current Medications (verified): 1)  Accupril 10 Mg Tabs (Quinapril Hcl) .Marland Kitchen.. 1 By Mouth Two Times A Day 2)  Aciphex 20 Mg Tbec (Rabeprazole Sodium) .... One Time A Day 3)  Synthroid 150 Mcg Tabs (Levothyroxine Sodium) .... Take 1 Tablet By Mouth Every Morning 4)  Furosemide 40 Mg Tabs (Furosemide) .... Take 1 Tablet By Mouth Two Times A Day 5)  Carvedilol 12.5 Mg Tabs (Carvedilol) .... Take One Tablet By Mouth Twice A Day 6)  Crestor 5 Mg Tabs (Rosuvastatin Calcium) .... Take One Tablet Once Daily 7)  Aleve 220 Mg Tabs (Naproxen Sodium) .Marland Kitchen.. 1 Tab At Bedtime For Back Pain As Needed 8)  Tylenol Extra Strength 500 Mg Tabs (Acetaminophen) .Marland Kitchen.. 1 Tab At Bedtime For Back Pain  Allergies (verified): 1)  ! Penicillin 2)  ! * Milk   ICD Specifications Following MD:  Virl Axe, MD     ICD Vendor:  [facility] [employer]     ICD Model Number:  [phone]     ICD Serial Number:  [id number] ICD DOI:  06/10/2010     ICD Implanting MD:  Virl Axe, MD  Lead 1:    Location: RA     DOI: 08/25/2002     Model #: [id number]     Serial #: [id number]     Status: active Lead 2:    Location: RV     DOI: 08/25/2002     Model #: [id number]     Serial #: [id number]     Status: active Lead 3:    Location: LV     DOI: 08/25/2002     Model #: [id number]     Serial #: [id number]     Status: active  Indications::  CHF  Explantation Comments: 06/10/10 [employer] Contak [id number] explanted  ICD Follow Up Battery Voltage:  GOOD V     Charge Time:  8.8 seconds     Battery Est. Longevity:  8 YRS Underlying rhythm:  SR ICD Dependent:  No       ICD Device Measurements Atrium:  Amplitude: 4.0 mV, Impedance: 440 ohms, Threshold: 0.5 V at 0.4 msec Right Ventricle:  Amplitude: 20.1 mV, Impedance: 5547 ohms, Threshold: 0.7 V at 0.4 msec Left Ventricle:  Amplitude: 19.1 mV, Impedance: 634 ohms, Threshold: 0.7 V at 0.4 msec Shock Impedance: 50 ohms   Episodes MS Episodes:  0     Coumadin:  No Shock:  0      ATP:  0     Nonsustained:  53     Atrial Pacing:  <1%     Ventricular Pacing:  99%  Brady Parameters Mode DDD     Lower Rate Limit:  40     Upper Rate Limit 120 PAV 80     Sensed AV Delay:  80  Tachy Zones VF:  220     VT:  200     VT1:  140     Next Remote Date:  03/06/2011     Tech Comments:  53 NST EPISODES--MOSTLY 1:1 CONDUCTION.  NORMAL DEVICE FUNCTION.  NO CHANGES MADE.  LV OUTPUT PROGRAMMED AT 1.0  IN PAST DUE TO DIAPHRAMATIC STIM.  LATITUDE CHECK 03-06-11 --PT TO CALL IF HAS PROBLEMS HOOKING UP TRANSMITTER.  ROV IN OCT 2012 W/SK. Shelly Bombard  December 05, 2010 11:48 AM

## 2011-01-02 NOTE — Assessment & Plan Note (Signed)
Summary: 3 month rov.sl   Visit Type:  Follow-up Primary Provider:  Dorena Cookey MD   History of Present Illness: Glenn Gonzalez is a 50 y/o male with long h/o CHF due to NICM (? related to chemo/XRT) with EF 25% s/p CRT-D previously followed by Dr. Melvern Banker.  PMHx notable for Hodgkins lymphona (treated age 65-23) Treated with high dose chemo - including adriamycin and XRT. Also has AF, VT, PUD and asthma. Developed NICM shortly thereafter. Initial EF 13%. Seen at [facility] transplant clinic much later and felt to be doing too well.   Last echo at [facility] recently and: EF < 25% mild MR. Myoview 4/07 EF 35% no ischemia or scar. Recently underwent generator changeout of his ICD with Dr. [name].   Previously in HF-ACTION with pVO2 in mid to low 20s. Was very active but recently tripped and fractured his foot. Following with Dr. Percell Miller. Wearing boot. Not able to walk but remains as active as possible with other activities.  Volume status well controlled. No significant edema, orthopnea, PND. Very compliant with meds. Has not been able to tolerate spironolactone or Inspra due to painful gynecomastia requiring surgery. BP runs low but not currently symptomatic. Has not tolerated medication titration beyond this point.   Current Medications (verified): 1)  Accupril 10 Mg Tabs (Quinapril Hcl) .Marland Kitchen.. 1 By Mouth Two Times A Day 2)  Aciphex 20 Mg Tbec (Rabeprazole Sodium) .... One Time A Day 3)  Synthroid 150 Mcg Tabs (Levothyroxine Sodium) .... Take 1 Tablet By Mouth Every Morning 4)  Furosemide 40 Mg Tabs (Furosemide) .... Take 1 Tablet By Mouth Two Times A Day 5)  Carvedilol 12.5 Mg Tabs (Carvedilol) .... Take One Tablet By Mouth Twice A Day 6)  Crestor 5 Mg Tabs (Rosuvastatin Calcium) .... Take One Tablet Once Daily 7)  Aleve 220 Mg Tabs (Naproxen Sodium) .Marland Kitchen.. 1 Tab At Bedtime For Back Pain As Needed 8)  Tylenol Extra Strength 500 Mg Tabs (Acetaminophen) .Marland Kitchen.. 1 Tab At Bedtime For Back Pain  Allergies (verified): 1)   ! Penicillin 2)  ! * Milk  Past History:  Past Medical History: Last updated: 09/02/2010 Congestive heart failure Hodkins lymphoma IIIB 1980 a-fib gerd asthma Gallbladder removed pacemaker- [employer] COGNIS device 2011 revision hypothyroidism recurrent pneumonia sedf. rad.tx for lymphoma Depression  Vital Signs:  Patient profile:   50 year old male Height:      72 inches Weight:      190 pounds Pulse rate:   80 / minute BP sitting:   88 / 62  (right arm)  Physical Exam  General:  The patient was alert and oriented in no acute distress. HEENT Normal.  Neck veins were flat, carotids were brisk.  Lungs were clear.  PMI laterally displaced. Heart sounds were regular without murmurs or gallops.  Abdomen was soft with active bowel sounds. There is no clubbing cyanosis or edema. R foot in boot.  Skin Warm and dry     ICD Specifications Following MD:  Virl Axe, MD     ICD Vendor:  [place] Number:  [phone]     ICD Serial Number:  [id number] ICD DOI:  06/10/2010     ICD Implanting MD:  Virl Axe, MD  Lead 1:    Location: RA     DOI: 08/25/2002     Model #: [id number]     Serial #: [id number]     Status: active Lead 2:    Location:  RV     DOI: 08/25/2002     Model #: [id number]     Serial #: [id number]     Status: active Lead 3:    Location: LV     DOI: 08/25/2002     Model #: [id number]     Serial #: [id number]     Status: active  Indications::  CHF  Explantation Comments: 06/10/10 [employer] Contak [id number] explanted  ICD Follow Up ICD Dependent:  No      Episodes Coumadin:  No  Brady Parameters Mode DDD     Lower Rate Limit:  40     Upper Rate Limit 120 PAV 80     Sensed AV Delay:  80  Tachy Zones VF:  220     VT:  200     VT1:  140     Impression & Recommendations:  Problem # 1:  SYSTOLIC HEART FAILURE, CHRONIC (ICD-428.22) Doing well. Currently NYHA class I-II. Volume status looks good. Med titration limited by BP. On acceptable doses as is.  Anitra Lauth that he has been so well compensated I told him that if he needs to have foot surgery this would be an acceptable risk from a cardiac standpoint particularly if it could be done wilth a local block and conscious sedation. However, he would also be a candidate for GA, if needed. Given ICD not a canididate for MRI even with device shielding (I discussed this with Dr. [name] in clinic today).   Other Orders: EKG w/ Interpretation (93000)  Patient Instructions: 1)  Your physician recommends that you schedule a follow-up appointment in: 4 months with Dr. Haroldine Laws 2)  Your physician recommends that you continue on your current medications as directed. Please refer to the Current Medication list given to you today.

## 2011-01-20 ENCOUNTER — Telehealth: Payer: Self-pay | Admitting: Internal Medicine

## 2011-01-28 NOTE — Progress Notes (Signed)
Summary: echo results  not done here  Phone Note Call from Patient Call back at [phone]   Caller: Patient Reason for Call: Talk to Nurse Summary of Call: pt calling re echo results in september. Initial call taken by: Regan Lemming,  January 20, 2011 4:50 PM  Follow-up for Phone Call        Pt wanting to discuss results of 2D Echo done in September 2011 however as I informed pt - we have no results of an echo or evidence of an echo being performed here in this office.  Pt stated that he must be confused and thanked me for my time. Follow-up by: Sim Boast, RN,  January 20, 2011 5:30 PM

## 2011-02-03 ENCOUNTER — Telehealth: Payer: Self-pay | Admitting: Internal Medicine

## 2011-02-05 ENCOUNTER — Ambulatory Visit (INDEPENDENT_AMBULATORY_CARE_PROVIDER_SITE_OTHER): Payer: Managed Care, Other (non HMO) | Admitting: Physician Assistant

## 2011-02-05 ENCOUNTER — Encounter: Payer: Self-pay | Admitting: Physician Assistant

## 2011-02-05 ENCOUNTER — Other Ambulatory Visit: Payer: Self-pay | Admitting: Internal Medicine

## 2011-02-05 DIAGNOSIS — R609 Edema, unspecified: Secondary | ICD-10-CM | POA: Insufficient documentation

## 2011-02-05 DIAGNOSIS — R0602 Shortness of breath: Secondary | ICD-10-CM

## 2011-02-05 DIAGNOSIS — I5022 Chronic systolic (congestive) heart failure: Secondary | ICD-10-CM

## 2011-02-05 LAB — BASIC METABOLIC PANEL
Calcium: 9.3 mg/dL (ref 8.4–10.5)
Chloride: 102 mEq/L (ref 96–112)
Creatinine, Ser: 1.1 mg/dL (ref 0.4–1.5)
Glucose, Bld: 90 mg/dL (ref 70–99)
Potassium: 4.5 mEq/L (ref 3.5–5.1)

## 2011-02-11 NOTE — Progress Notes (Signed)
Summary: weight gain over the weekend and sob   Phone Note Call from Patient Call back at Home Phone [phone] Call back at [phone]   Caller: Patient Reason for Call: Talk to Nurse Summary of Call: pt states he has gain 8 pounds over the weekend and pt is having sob.  Initial call taken by: Regan Lemming,  February 03, 2011 8:50 AM  Follow-up for Phone Call        [facility] [id number] Voice mail [phone] Desiree Lucy, RN, BSN  February 03, 2011 9:39 AM  pt rtn call-pls call [place]  February 03, 2011 10:43 AM  Patient broke his foot 4 months ago. His resting HR has been around 90-100. He denies any LE edema but did notice swelling around his abdomen. He took an extra 40mg  of Lasix this am and his dyspnea has improved with adequate urine output.  Patient stated that he has gained 8 lbs. since Friday. Advised him that he should be seen due to the weight gain. Spoke with Dr. Clayborne Dana RN and he does not have any openings this week. He was unable to see Richardson Dopp tomorrow but will come Wednesday @ 10am. With his history for CHF, I advised him to monitor his weight gain and dyspnea and to call us back with any worsening symptoms. He agrees. Whitney Jannett Celestine RN  February 03, 2011 12:03 PM  Follow-up by: Whitney Jannett Celestine RN,  February 03, 2011 12:03 PM     Appended Document: weight gain over the weekend and sob  tell him to take lsix 80 two times a day tomorrow  also double up on kcl . get a bmet and bnp when he comes to see scott.   Appended Document: weight gain over the weekend and sob  patient aware- He will see Richardson Dopp today.

## 2011-02-11 NOTE — Assessment & Plan Note (Addendum)
Summary: rov/sob & weight gain/wpa   Visit Type:  ov Primary Provider:  Dorena Cookey MD  CC:  sob...edema.....Marland Kitchen  History of Present Illness: Primary Cardiologist:  Dr. [name] Primary Electrophysiologist:  Dr. 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 related to chemo/XRT) with EF 25% s/p CRT-D previously followed by Dr. Melvern Banker.  PMHx notable for Hodgkins lymphona (treated age 50-23) Treated with high dose chemo - including adriamycin and XRT. Also has AF, VT, PUD and asthma. Developed NICM shortly thereafter. Initial EF 13%. Seen at [facility] transplant clinic much later and felt to be doing too well.  Last echo at [facility] recently and: EF < 25% mild MR.  Myoview 4/07 EF 35% no ischemia or scar.   He has had a generator changeout of his ICD with Dr. [name].  He has had problems with his ankle after an injury.  His activity level has gone down considerably.  He was more active than normal this past weekend.  he noted an 8 pound weight gain.  He took an extra 40 mg of Lasix.  He lost about 10 pounds with this.  He feels that his weight is back to normal for him with a baseline of about 208 pounds at home.  He has class IIB dyspnea with exertion.  He denies orthopnea, PND or pedal edema.  He had some chest discomfort with increased fluid.  This resolved when he took extra Lasix.  He may have taken some ibuprofen the day that his weight increased.  Of note, he has had some palpitations her last several months.  He cannot really describe it.  However, he does have the feeling that he needs to cough with this.  According to the notes from his prior cardiologist (Dr. Rollene Fare) when he was seen a couple of weeks ago, he had 15 documented episodes of nonsustained V. tach of 8 beats or less.  No sustained VT or atrial arrhythmias.   Current Medications (verified): 1)  Accupril 10 Mg Tabs (Quinapril Hcl) .Marland Kitchen.. 1 By Mouth Two Times A Day 2)  Aciphex 20 Mg Tbec  (Rabeprazole Sodium) .... One Time A Day 3)  Synthroid 150 Mcg Tabs (Levothyroxine Sodium) .... Take 1 Tablet By Mouth Every Morning 4)  Furosemide 40 Mg Tabs (Furosemide) .... Take 1 Tablet By Mouth Two Times A Day 5)  Carvedilol 12.5 Mg Tabs (Carvedilol) .... Take One Tablet By Mouth Twice A Day 6)  Crestor 5 Mg Tabs (Rosuvastatin Calcium) .... Take One Tablet Once Daily 7)  Aleve 220 Mg Tabs (Naproxen Sodium) .Marland Kitchen.. 1 Tab At Bedtime For Back Pain As Needed 8)  Tylenol Extra Strength 500 Mg Tabs (Acetaminophen) .Marland Kitchen.. 1 Tab At Bedtime For Back Pain  Allergies: 1)  ! Penicillin 2)  ! * Milk  Past History:  Past Medical History: Last updated: 09/02/2010 Congestive heart failure Hodkins lymphoma IIIB 1980 a-fib gerd asthma Gallbladder removed pacemaker- [employer] COGNIS device 2011 revision hypothyroidism recurrent pneumonia sedf. rad.tx for lymphoma Depression  Review of Systems       As per  the HPI.  All other systems reviewed and negative.   Vital Signs:  Patient profile:   50 year old male Height:      72 inches Weight:      218.50 pounds BMI:     29.74 Pulse rate:   87 / minute Pulse rhythm:   regular BP sitting:   92 /  68  (left arm) Cuff size:   large  Vitals Entered By: Julaine Hua, CMA (February 05, 2011 10:34 AM)  Physical Exam  General:  Well nourished, well developed, in no acute distress HEENT: normal Neck: no JVD Cardiac:  normal S1, S2; RRR; no murmur Lungs:  clear to auscultation bilaterally, no wheezing, rhonchi or rales Abd: soft, nontender, no hepatomegaly Ext: no  edema Skin: warm and dry Neuro:  CNs 2-12 intact, no focal abnormalities noted    EKG  Procedure date:  02/05/2011  Findings:      sinus rhythm Ventricular paced Heart rate 87   ICD Specifications Following MD:  Virl Axe, MD     ICD Vendor:  [place] Number:  [place]     ICD Serial Number:  [id number] ICD DOI:  06/10/2010     ICD Implanting MD:   Virl Axe, MD  Lead 1:    Location: RA     DOI: 08/25/2002     Model #: [id number]     Serial #: [id number]     Status: active Lead 2:    Location: RV     DOI: 08/25/2002     Model #: [id number]     Serial #: [id number]     Status: active Lead 3:    Location: LV     DOI: 08/25/2002     Model #: [id number]     Serial #: [id number]     Status: active  Indications::  CHF  Explantation Comments: 06/10/10 [employer] Contak [id number] explanted  ICD Follow Up ICD Dependent:  No      Episodes Coumadin:  No  Brady Parameters Mode DDD     Lower Rate Limit:  40     Upper Rate Limit 120 PAV 80     Sensed AV Delay:  80  Tachy Zones VF:  220     VT:  200     VT1:  140     Impression & Recommendations:  Problem # 1:  SYSTOLIC HEART FAILURE, CHRONIC (ICD-428.22) His weight is back to his baseline at home.  His symptoms are back to baseline.  I am not certain what caused his fluid gain.  He has been taking more ibuprofen recently.  That may have been enough to cause fluid retention.  I have asked him to try to hold off on taking too much ibuprofen.  I will check a basic metabolic panel and BNP today.  I will bring him back in closer followup with Dr. Haroldine Laws in one month.  Of note, he was to start taking a higher dose of Lasix until he saw me in followup today.  That message was relayed to him today.  I think given his current stable status, he can continue with his current dose of Lasix.  If his BNP is significantly elevated, then we can increase his daily dose.  Problem # 2:  VENTRICULAR TACHYCARDIA (ICD-427.1) This may be what he has sensed recently with his palpitations.  I will review the notes from Dr. Rollene Fare with Dr. [name] to see if there is anything else we should do at this time.  Otherwise he can follow up as scheduled.  I did speak to Dr. [name].  He noted his high heart rates (HR 100s) at his last interrogation.  We tried to pull his HR histograms, but we were unsuccessful.  Dr. [name] suggested we try  to add a tiny amount of Toprol  to lower his HR.  I will cut back on his accupril to 5 mg two times a day to allow his BP to come up some.  I will have him start Toprol XL 25 mg 1/2 once daily.  We may be able to bring this up to 25 mg once daily at his follow up.  He will remain on carvedilol.  I discussed with the patient by phone.  Other Orders: EKG w/ Interpretation (93000) TLB-BMP (Basic Metabolic Panel-BMET) (99991111) TLB-BNP (B-Natriuretic Peptide) (83880-BNPR)  Patient Instructions: 1)  Your physician recommends that you schedule a follow-up appointment in: 1 month with DR. Haroldine Laws ASPER SCOTT WEAVER, PA-C. 2)  Your physician recommends that you return for lab work in: [place] BMET 782.3, 786.05, 428.22, BNP 782.3, 786.05, 428.22 3)  Your physician recommends that you continue on your current medications as directed. Please refer to the Current Medication list given to you today. Prescriptions: TOPROL XL 25 MG XR24H-TAB (METOPROLOL SUCCINATE) 1/2 by mouth once daily  #15 x 5   Entered and Authorized by:   Richardson Dopp PA-C   Signed by:   Richardson Dopp PA-C on 02/06/2011   Method used:   Electronically to        [facility]. [phone]* (retail)       [address]       [place], [place]  [address]       Ph: [phone]       Fax: [phone]   RxID:   [id number]    Appended Document: rov/sob & weight gain/wpa ok. can consider digoxin to help with tachycardia. I prefer not to use 2 different beta-blockers, if possible.  will see in f/u. he will need CPX at some point when foot is better (he refused in past)  Appended Document: rov/sob & weight gain/wpa pt is sch to see Dr Haroldine Laws on 5/9, do you want me to call pt and change meds or wait until appt?  Appended Document: rov/sob & weight gain/wpa we can wait and see how he does.

## 2011-02-16 LAB — SURGICAL PCR SCREEN: MRSA, PCR: NEGATIVE

## 2011-02-18 ENCOUNTER — Telehealth: Payer: Self-pay | Admitting: Internal Medicine

## 2011-02-18 ENCOUNTER — Encounter: Payer: Self-pay | Admitting: Internal Medicine

## 2011-02-18 NOTE — Telephone Encounter (Signed)
OK to stop Toprol.

## 2011-02-18 NOTE — Telephone Encounter (Signed)
Patient returned call-states he retained 6lbs of fluid and this resolved with an extra Lasix.  He also c/o nausea ,decreased appetite and fatigue for around 1 week after starting on Toprol.  Held Toprol for 1 day and felt much better.

## 2011-02-18 NOTE — Telephone Encounter (Signed)
A user error has taken place: encounter opened in error, closed for administrative reasons.

## 2011-02-18 NOTE — Telephone Encounter (Signed)
Called patient back-informed per Dr. Haroldine Laws, [place] to stop Toprol.

## 2011-03-05 ENCOUNTER — Telehealth: Payer: Self-pay | Admitting: Internal Medicine

## 2011-03-05 NOTE — Telephone Encounter (Signed)
Needs refill asafex 20 mg twice a day, accupril 10mg  twice a day, carvedilol 12.5 mg twice a day, sinthroid .15 mg once1 a day /  [employer] home delivery wants generic for all if possible

## 2011-03-06 ENCOUNTER — Encounter: Payer: Self-pay | Admitting: *Deleted

## 2011-03-07 ENCOUNTER — Other Ambulatory Visit: Payer: Self-pay | Admitting: *Deleted

## 2011-03-07 MED ORDER — QUINAPRIL HCL 10 MG PO TABS: 10.0000 mg | ORAL_TABLET | Freq: Two times a day (BID) | ORAL | Status: DC

## 2011-03-07 MED ORDER — RABEPRAZOLE SODIUM 20 MG PO TBEC: 20.0000 mg | DELAYED_RELEASE_TABLET | Freq: Two times a day (BID) | ORAL | Status: DC

## 2011-03-07 MED ORDER — LEVOTHYROXINE SODIUM 150 MCG PO TABS: 150.0000 ug | ORAL_TABLET | Freq: Every day | ORAL | Status: DC

## 2011-03-07 MED ORDER — CARVEDILOL 12.5 MG PO TABS: 12.5000 mg | ORAL_TABLET | Freq: Two times a day (BID) | ORAL | Status: DC

## 2011-03-11 ENCOUNTER — Encounter: Payer: Self-pay | Admitting: *Deleted

## 2011-03-18 NOTE — Telephone Encounter (Signed)
Pt needs all his heart meds to be fax to # [phone].

## 2011-03-21 ENCOUNTER — Telehealth: Payer: Self-pay | Admitting: Internal Medicine

## 2011-03-21 MED ORDER — CARVEDILOL 12.5 MG PO TABS: 12.5000 mg | ORAL_TABLET | Freq: Two times a day (BID) | ORAL | Status: DC

## 2011-03-21 MED ORDER — QUINAPRIL HCL 10 MG PO TABS: 10.0000 mg | ORAL_TABLET | Freq: Two times a day (BID) | ORAL | Status: DC

## 2011-03-21 NOTE — Telephone Encounter (Signed)
Will print out and manually fax to pharmacy not sure if it will go through on epic

## 2011-03-21 NOTE — Telephone Encounter (Signed)
Spoke with pt explained to him that i can refill the cardiac mediations and to ask primary for the rest of the refills

## 2011-04-02 ENCOUNTER — Encounter: Payer: Self-pay | Admitting: Internal Medicine

## 2011-04-04 ENCOUNTER — Encounter: Payer: Self-pay | Admitting: Internal Medicine

## 2011-04-09 ENCOUNTER — Ambulatory Visit (INDEPENDENT_AMBULATORY_CARE_PROVIDER_SITE_OTHER): Payer: Managed Care, Other (non HMO) | Admitting: Internal Medicine

## 2011-04-09 ENCOUNTER — Ambulatory Visit (INDEPENDENT_AMBULATORY_CARE_PROVIDER_SITE_OTHER): Payer: Managed Care, Other (non HMO) | Admitting: *Deleted

## 2011-04-09 ENCOUNTER — Encounter: Payer: Self-pay | Admitting: Internal Medicine

## 2011-04-09 VITALS — BP 108/82 | HR 72 | Ht 73.0 in | Wt 216.0 lb

## 2011-04-09 DIAGNOSIS — I472 Ventricular tachycardia: Secondary | ICD-10-CM

## 2011-04-09 DIAGNOSIS — F32A Depression, unspecified: Secondary | ICD-10-CM | POA: Insufficient documentation

## 2011-04-09 DIAGNOSIS — I428 Other cardiomyopathies: Secondary | ICD-10-CM

## 2011-04-09 DIAGNOSIS — Z95810 Presence of automatic (implantable) cardiac defibrillator: Secondary | ICD-10-CM | POA: Insufficient documentation

## 2011-04-09 DIAGNOSIS — F329 Major depressive disorder, single episode, unspecified: Secondary | ICD-10-CM | POA: Insufficient documentation

## 2011-04-09 DIAGNOSIS — Z950 Presence of cardiac pacemaker: Secondary | ICD-10-CM

## 2011-04-09 DIAGNOSIS — I5022 Chronic systolic (congestive) heart failure: Secondary | ICD-10-CM

## 2011-04-09 MED ORDER — SERTRALINE HCL 50 MG PO TABS: ORAL_TABLET | ORAL | Status: DC

## 2011-04-09 NOTE — Patient Instructions (Addendum)
Follow-up in 3 months with device clinic Your physician recommends that you schedule a follow-up appointment in: 2 months with Dr. Haroldine Laws Cardiopulmonary excerise test on the bike with Samule Dry at the hospital Your physician has recommended you make the following change in your medication: zoloft  See Dr. [name] for av op.

## 2011-04-09 NOTE — Progress Notes (Signed)
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 related to chemo/XRT) with EF 25% s/p CRT-D previously followed by Dr. Melvern Banker.  PMHx notable for Hodgkins lymphona (treated age [age]) Treated with high dose chemo - including adriamycin and XRT. Also has AF, VT, PUD and asthma. Developed NICM shortly thereafter. Initial EF 13%. Seen at [facility] transplant clinic much later and felt to be doing too well.   Last echo at [facility] recently and: EF < 25% mild MR. Myoview 4/07 EF 35% no ischemia or scar. Recently underwent generator changeout of his ICD with Dr. [name].  Has not been able to tolerate spironolactone or Inspra due to painful gynecomastia requiring surgery.   Previously in HF-ACTION with pVO2 in mid to low 20s. Recovered from foot fracture earlier this year. Saw Richardson Dopp in March for transient volume overload which responded to additional lasix. Also noted that HRs were high so low-dose Toprol added to Coreg. Unable to tolerate due to profound fatigue so stopped.  Volume status well controlled. No significant edema, orthopnea, PND. Very compliant with meds.  BP runs low but not currently symptomatic. Has become increasingly fatigued. Thinks part of it is his foot and part HF. When in the store has to stop after a few aisles. Feels as if he is slipping back into depression.   Stopped statin due to myalgias.   Device interrogation done today. HR variability remains broad but average HR has trended up. Activity index looks good (up if anything). Multiple brief episodes of NSVT and PMT. No sustained episodes.   ROS: All systems negative except as listed in HPI, PMH and Problem List.  Past Medical History  Diagnosis Date  . CHF (congestive heart failure)   . Hodgkin's lymphoma 1980    IIIB  . A-fib   . GERD (gastroesophageal reflux disease)   . Asthma   . Pacemaker 2011    Permanent pacemaker - [place] device/2011  . Hypothyroidism   . Depression   . Pneumonia      recurrent pneumonia sedf.  rad tx for lymphoma    Current Outpatient Prescriptions  Medication Sig Dispense Refill  . acetaminophen (TYLENOL) 500 MG tablet Take 500 mg by mouth as needed. 1 tab at bed time for back pain       . carvedilol (COREG) 12.5 MG tablet Take 1 tablet (12.5 mg total) by mouth 2 (two) times daily with a meal.  180 tablet  3  . furosemide (LASIX) 40 MG tablet Take 40 mg by mouth 2 (two) times daily.        Marland Kitchen levothyroxine (SYNTHROID) 150 MCG tablet Take 1 tablet (150 mcg total) by mouth daily.  90 tablet  3  . naproxen sodium (ANAPROX) 220 MG tablet Take 220 mg by mouth as needed. 1 tab at bedtime for back pain as needed       . quinapril (ACCUPRIL) 10 MG tablet Take 1 tablet (10 mg total) by mouth 2 (two) times daily.  180 tablet  3  . RABEprazole (ACIPHEX) 20 MG tablet Take 1 tablet (20 mg total) by mouth 2 (two) times daily.  180 tablet  3  . DISCONTD: metoprolol succinate (TOPROL-XL) 25 MG 24 hr tablet Take 25 mg by mouth daily. 1/2 tab by mouth daily       . DISCONTD: rosuvastatin (CRESTOR) 5 MG tablet Take 5 mg by mouth daily.  PHYSICAL EXAM: Filed Vitals:   04/09/11 1000  BP: 108/82  Pulse: 72   General:  Well appearing. No resp difficulty HEENT: normal Neck: supple. JVP flat. Carotids 2+ bilaterally; no bruits. No lymphadenopathy or thryomegaly appreciated. Cor: PMI normal. Regular rate & rhythm. No rubs, gallops or murmurs. Lungs: clear Abdomen: soft, nontender, nondistended. No hepatosplenomegaly. No bruits or masses. Good bowel sounds. Extremities: no cyanosis, clubbing, rash, edema Neuro: alert & orientedx3, cranial nerves grossly intact. Moves all 4 extremities w/o difficulty. Affect pleasant.    ASSESSMENT & PLAN:

## 2011-04-09 NOTE — Assessment & Plan Note (Addendum)
Currently with NYHA III symptoms. Volume status looks good. Unclear if change in functional capacity related to HF, deconditioning, depression or combination. Will check CPX on bike to assess VO2 and slope. Unable to tolerate further medication titration despite multiple attempts. Check with EP for possible AV optimization of BiV.

## 2011-04-09 NOTE — Progress Notes (Signed)
ICD check

## 2011-04-09 NOTE — Assessment & Plan Note (Signed)
Start sertraline at 25 qd and titrate to 50 qd.

## 2011-04-15 ENCOUNTER — Ambulatory Visit (HOSPITAL_COMMUNITY): Payer: Managed Care, Other (non HMO) | Attending: Internal Medicine

## 2011-04-15 DIAGNOSIS — I5022 Chronic systolic (congestive) heart failure: Secondary | ICD-10-CM | POA: Insufficient documentation

## 2011-04-15 DIAGNOSIS — I428 Other cardiomyopathies: Secondary | ICD-10-CM

## 2011-04-15 NOTE — Assessment & Plan Note (Signed)
[place]                         ELECTROPHYSIOLOGY OFFICE NOTE   NAME:Glenn Gonzalez, Glenn Gonzalez                    MRN:          [id number]  DATE:10/18/2008                            DOB:          1960/12/10    Glenn Gonzalez is seen in followup for congestive heart failure in the  setting of nonischemic cardiomyopathy secondary to chemotherapy with  lymphoma.  He is doing quite well.  He had some problems with dizziness  in the summer.  It was attributed to hypotension.  His Coreg dose was  down titrated he felt much better.  He has some sensation that his heart  is beating faster and working harder, although he had no specific  changes in exercise tolerance.  He is still able to quite carry the  twins up flight of stairs.   On examination today, his weight was 209.  His blood pressure was 90/60,  his pulse was 78.  Lungs were clear.  Heart sounds were regular.  Neck  veins were flat.  The extremities were without edema.   Interrogation of his Guidant ICD demonstrates a P-wave of 4.6 with  impedance of 441, a threshold of 0.4 and 0.5, the R-wave of 20.8 with  impedance of 542 with threshold 0.8 and 0.5, and the LV impedance was  612 with threshold of 0.6 and 0.8.  There are 50 episodes of  nonsustained and 40 no therapies.  It is interesting that he has 1:1 VA  conduction rate about 140, but this was 1:1 VA conduction rate of about  180.  It is also notable that most of his ventricular tachycardias of  the events counted occurred in the month of July and mostly on June 11, 2008.  Other than that, they have been relatively sporadic.   IMPRESSION:  1. Ventricular tachycardia.  2. Nonischemic cardiomyopathy.  3. Chronic class II congestive heart failure - systolic.  4. Status post cardiac resynchronization therapy device for the above.  5. Hypotension.   Glenn Gonzalez is doing pretty well.  I think the fact that his VT has  become relatively sporadic  again argues against any specific  intervention.  I will see him again in 3 months' time.     Deboraha Sprang, MD, [facility]  Electronically Signed    SCK/MedQ  DD: 10/18/2008  DT: 10/19/2008  Job #: [id number]   cc:   Bryson Dames, M.D.

## 2011-04-15 NOTE — Assessment & Plan Note (Signed)
[place]                         ELECTROPHYSIOLOGY OFFICE NOTE   NAME:Gonzalez, Glenn LANGELAND                    MRN:          [id number]  DATE:08/31/2007                            DOB:          Apr 07, 1961    [name] came in the other day for AV optimization.  His functional  status is about the same.  We undertook AV optimization.  He has  intrinsic conduction about 110 milliseconds and so we changed him from  80 to 90 milliseconds.   His ejection fraction remains at about 20%.   We will see him again as previously scheduled.     Deboraha Sprang, MD, [facility]  Electronically Signed    SCK/MedQ  DD: 09/02/2007  DT: 09/03/2007  Job #: [id number]

## 2011-04-15 NOTE — Assessment & Plan Note (Signed)
[place]                         ELECTROPHYSIOLOGY OFFICE NOTE   NAME:Glenn Gonzalez, Glenn Gonzalez                    MRN:          [id number]  DATE:03/06/2009                            DOB:          [birth year]    Glenn Gonzalez is seen in followup for nonischemic cardiomyopathy,  ventricular tachycardia, and status post ICD implantation with a CRT and  he is doing really very well.  He was having problem with presyncope and  his Coreg dose was decreased.  It is now down to 12.5 and his presyncope  has gone.   His medications currently include Synthroid, Accupril, furosemide, Coreg  12.5 b.i.d., and Biaxin.   On examination, his blood pressure was 84/62, his pulse was 60.  His  lungs were clear.  His heart sounds were regular.  The abdomen was soft.  Extremities had no edema.   Interrogation of his Guidant contact ICD demonstrates a P-wave of 5 with  impedance of 450, threshold 0.6 at 0.5.  The R-wave was 80.5 with  impedance of 555, the threshold 0.8 at 0.5.  The LV impedance was 595  with an amplitude of 14.5 with threshold 0.6 at 0.5.  Battery voltage  was 2.57.  There is 1 intercurrent episode of nonsustained VT.  There  are number of episodes of sinus tachycardia which are more prominent  since the Coreg dose has been withdrawn.   IMPRESSION:  1. Nonischemic cardiomyopathy.  2. Congestive heart failure - chronic - systolic - class II.  3. Hypotension with largely asymptomatic on a lower dose of Coreg      consideration for further down titration of his Coreg.  4. Ventricular tachycardia - quiescent.   Glenn Gonzalez is doing amazingly well.  He came in with his 2 children  today and they are also doing great.  We will see him again in 3-6  months' time.     Deboraha Sprang, MD, [facility]  Electronically Signed    SCK/MedQ  DD: 03/06/2009  DT: 03/06/2009  Job #: [id number]   cc:   Bryson Dames, M.D.

## 2011-04-18 NOTE — Letter (Signed)
February 09, 2007    Bryson Dames, M.D.  [phone] [address], [address] [place], [place] [address]   RE:  DUSTINE, WEISNER  MRN:  [id number]  /  DOB:  04/22/1961   Dear Rush Landmark,   Mr. Markiewicz came in for AV optimization.  It was interesting to find  that his AV conduction was shorter than about 110 milliseconds, which  had been his programmed AV delay.  We shortened it up to about 90 and  turned the dynamic AV delay off.   His shortness of breath has been moderately worsened over the last six  months.  This is about the same time that his diuretic dose was reduced  from 80/80 to 80/40 because of presyncope.   I have suggested that we evaluate this in stages, we look at the effects  of reprogramming his AV delay initially and then, thereafter, we will  see if adjustment of his diuretics might be helpful with reduction in  symptoms.   I also suggested it may be worth his talking to you regarding a followup  with Dr. Jerilee Hoh at [facility] on the transplant service, and I mentioned  further to Mr. Renier that we would have Dan  Bensimhon do a CPX, so we would have that information available at the  time of followup evaluation.   Bill, I hope this letter finds you well.    Sincerely,      Deboraha Sprang, MD, [facility]  Electronically Signed    SCK/MedQ  DD: 02/09/2007  DT: 02/11/2007  Job #: [id number]

## 2011-04-18 NOTE — Op Note (Signed)
NAME:  Glenn Gonzalez, Glenn Gonzalez NO.:  [id number]   MEDICAL RECORD NO.:  [id number]          PATIENT TYPE:  INP   LOCATION:  2871                         FACILITY:  [facility]   PHYSICIAN:  Deboraha Sprang, M.D.  DATE OF BIRTH:  September 08, 1961   DATE OF PROCEDURE:  02/07/2005  DATE OF DISCHARGE:                                 OPERATIVE REPORT   PREOPERATIVE DIAGNOSES:  Previously implanted CRT defibrillator on advisory  recall; with cutaneous stenting.   POSTOPERATIVE DIAGNOSES:  Previously implanted CRT defibrillator on advisory  recall; with cutaneous stenting.   PROCEDURE:  Explantation of a previously implanted device, with implantation  of  a new device and with testing of the lead (but DFT testing was not  undertaken).   DESCRIPTION OF PROCEDURE:  On the obtainment of informed consent, the  patient was brought to the electrophysiology laboratory and placed on the  fluoroscopic table in supine position.  After routine prep and drape,  lidocaine was infiltrated along the line of the previous incision, and was  carried down to the layer of the device pocket using sharp dissection.  It  was noted that the tissue was less than 0.5 cm in thickness.  The pocket was  opened carefully, and the device was explanted.  The leads were freed up  from the subcutaneous tissue.  All this took a good deal of time.  The leads  were then assessed and through the PSA the previously implanted Medtronic  5076 inferior lead (Ser. No. [id number]) demonstrated a P-wave of 3 low  impedance at 446 and a threshold of 0.4 and 0.5.  Currented threshold was  1.0 mA.   I made the previously implanted RV defibrillator lead [facility] [id number])  demonstrated an R-wave of 14.7, impedance of 650, with a threshold of 0.8  and 0.5, and with a currented threshold of 1.3 mA.  Then the previously  implanted LV lead with an LV-1 header (Model [id number], Serial No. [id number])  demonstrated an L-wave of 16.5 with impedance  of 6 and 11, with a threshold  of 0.5 and 0.5; with a currented threshold of 1.1 mA.   With these acceptable parameters recorded, the leads were then attached to a  [place] ICD (Serial No. [id number]).  The parameters were  reassessed.  I don't have them recorded.  The high voltage impedance was 31  ohm.   At this point the pocket was copiously irrigated with antibiotic-containing  saline solution.  The pocket had to be revised, with extension of the pocket  caudally (because of the thinning of the tissue overlying the incision).  I  ended up trying to draw some of the tissue from cephalad and caudal into the  incision closure.  Hemostasis was obtained.  The wound was then closed in three layers in the normal fashion.  The wound  was washed, dried and Benzoin Steri-Strips dressing applied.  Needle counts,  sponge counts and instrument counts were correct at the end of the procedure  (according to the staff).  The patient tolerated the procedure without  apparent complication.  SCK/MEDQ  D:  02/07/2005  T:  02/08/2005  Job:  [id number]   cc:   Bryson Dames, M.D.  [address], [address] [place] [address]  Fax: [phone]

## 2011-04-18 NOTE — Cardiovascular Report (Signed)
NAME:  Glenn Gonzalez, Glenn Gonzalez                       ACCOUNT NO.:  [id number]   MEDICAL RECORD NO.:  [id number]                   PATIENT TYPE:  INP   LOCATION:  3702                                 FACILITY:  [facility]   PHYSICIAN:  Bryson Dames, M.D.             DATE OF BIRTH:  [birth year]   DATE OF PROCEDURE:  07/15/2002  DATE OF DISCHARGE:                              CARDIAC CATHETERIZATION   PROCEDURES PERFORMED:  1. Right heart catheterization with thermodilution cardiac output     determinations.  2. Selective coronary angiography by Judkins technique.  3. Retrograde left heart catheterization.  4. Left ventricular angiography.   COMPLICATIONS:  None.   ENTRY SITE:  Right femoral.   DYE USED:  Omnipaque.   MEDICATIONS GIVEN:  None.   PATIENT PROFILE:  The patient is a 50 year old white married male who has  known Adriamycin-induced cardiomyopathy and low ejection fraction.  He  entered the hospital yesterday for symptoms of presyncope and a Holter  monitor showing episodes of nonsustained ventricular tachycardia.  Dr.  Deboraha Sprang has done an EP consult on the patient and is anticipating  the possible placement of an implantable defibrillator, pending this  catheterization.   The patient underwent cardiac catheterization at [facility] last on December 18, 2000.  Today, he is in inpatient for his elective cardiac  catheterization this morning.   RESULTS:  Pressures:  The right atrial mean pressure was 13.  The right  ventricular pressure was 50/15.  The pulmonary artery pressure was 52/30  with a mean of 40.  The pulmonary capillary wedge mean pressure was 31, the  A wave was 30, the V wave was 45.  Left ventricular pressure was 82/22 with  an end-diastolic pressure of 40.  The central aortic pressure was 81/61 with  a mean of 68.  The thermodilution cardiac outputs were 4.2, which was an  average of 5; the cardiac index was 1.8.  The Fick cardiac output was 4.0.  The cardiac index by Fick was 1.7.   Compared to the hemodynamics from approximately 20 months ago, the wedge  pressure has increased from 27 to 31.  The right atrial mean and the  systemic pressure are about the same.  The cardiac index and outputs are  about the same.   ANGIOGRAPHIC RESULTS:  The left main coronary artery appears normal.  The  left anterior descending coronary artery courses to the cardiac apex and  gives rise to one diagonal branch near the origin of a first septal  perforator branch.  Both LAD and diagonal branches are normal.   The circumflex is nondominant.  It distally gives rise to a large  posterolateral branch.  No lesions are seen in the circumflex.   The right coronary artery has an inferior takeoff.  It is a large dominant  vessel with no lesions.   The left ventricle is dilated.  It is  globally hypokinetic.  I estimate the  ejection fraction at 15% plus-or-minus 5%.  There is moderate mitral  regurgitation, yet I do not see evidence of LV thrombus.   IMPRESSIONS:  1. 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  2. Moderate mitral regurgitation with high V wave of 45.  3. High resting pulmonary capillary wedge mean pressures without overt     pulmonary edema.  4. Low systemic pressures and low cardiac outputs.   Overall clinical impression is the patient has had a symptomatic  deterioration with presyncope and ventricular tachycardia in the last  several months; he has also had symptoms of exertional dyspnea without  peripheral or pulmonary edema and would seem to have had a worsening of his  functional status from basically a class 2 to a class 3.   RECOMMENDATIONS:  I have discussed the patient's case with Dr. Lacretia Leigh, the transplant coordination at [facility].  Dr.  Joneen Caraway will accept the patient in transfer for a reconsideration of cardiac  transplantation.  I will  also speak to Dr. [name] about holding off on the  implantable defibrillator at this time.                                               Bryson Dames, M.D.    WHG/MEDQ  D:  07/15/2002  T:  07/15/2002  Job:  [id number]   cc:   Deboraha Sprang, M.D. [facility]

## 2011-04-18 NOTE — Assessment & Plan Note (Signed)
[place]                         ELECTROPHYSIOLOGY OFFICE NOTE   NAME:Snarski, JABRE WASCO                    MRN:          [id number]  DATE:01/20/2007                            DOB:          12-11-1960    Mr. Goldie comes in.  It has been some time since we have seen him.  He is doing well.  His kids are now 92 months.  He has noted some  changes in exercise capacity, still able to walk as he has, but he is  not able to jog and he is not able to do pushups without  decompensating.  His medications are reviewed and are notable for  Coreg and Accupril.  He is notable not on an aldosterone antagonist.   On examination, his blood pressure was 100/77, pulse was 69.  Lungs were  clear, heart sounds were regular, neck veins were flat.   Interrogation of his Guidant H175 device demonstrates a P wave of 4.5  with impedance of 441, a threshold of 0.4 at 0.5.  The R wave was 16 as  was the L wave.  Impedance was 517 with an LV impedance of 580, a  threshold of 0.8 at 0.5 in both chambers.  There are no intercurrent  therapies.  His heart rate excursion looked okay.  His AV delay was  programmed at 110/80.   IMPRESSION:  1. Acute-on-chronic congestive systolic failure.  2. Nonischemic cardiomyopathy.  3. Status post CRT-ICD.  4. History of inappropriate shocks.  5. History of pacemaker-mediated tachycardia.   Mr. Haga is doing pretty well.  I am concerned about his change in  exercise capacity and will plan to undertake AV optimization.   I will forward these results to Dr. Melvern Banker.   Will see him again in 4 months' time.     Deboraha Sprang, MD, [facility]  Electronically Signed    SCK/MedQ  DD: 01/20/2007  DT: 01/20/2007  Job #: [id number]   cc:   Bryson Dames, M.D.

## 2011-04-18 NOTE — Op Note (Signed)
NAME:  Glenn Gonzalez, Glenn Gonzalez                       ACCOUNT NO.:  [id number]   MEDICAL RECORD NO.:  [id number]                   PATIENT TYPE:  OIB   LOCATION:  2899                                 FACILITY:  [facility]   PHYSICIAN:  Coralie Keens, M.D.              DATE OF BIRTH:  02-13-1961   DATE OF PROCEDURE:  12/18/2003  DATE OF DISCHARGE:                                 OPERATIVE REPORT   PREOPERATIVE DIAGNOSIS:  Bilateral gynecomastia and breast mass.   POSTOPERATIVE DIAGNOSIS:  Bilateral gynecomastia and breast mass.   PROCEDURE:  Excision of  bilateral gynecomastia.   SURGEON:  Coralie Keens, M.D.   ANESTHESIA:  1% Lidocaine with monitored anesthesia care.   ESTIMATED BLOOD LOSS:  Minimal.   INDICATIONS FOR PROCEDURE:  Glenn Gonzalez is a 50 year old gentleman  who  had undergone chemotherapy and radiation treatment many years ago for non  Hodgkin's lymphoma. He has since had significant cardiac  problems. He has  been on cardiac medications secondary to this  as well as having a pacemaker  placed. After the chemotherapy and cardiac  medications, he has developed  bilateral, very large gynecomastia, which has been getting increasingly  larger and more painful. Given this and his history of Hodgkin's lymphoma,  the decision was made to proceed with bilateral excision of  the  gynecomastia. Preoperatively his pacemaker was turned off by the pacemaker  representatives.   DESCRIPTION OF PROCEDURE:  The patient was brought to the operating room and  identified  as Glenn Gonzalez. He was placed in the supine position on the  operating table and anesthesia was induced. His chest was then prepped and  draped in the usual sterile fashion. The skin  underneath the right nipple  was  then anesthetized with 1% Lidocaine.   A small circumareolar incision was made on the lower surface of the nipple.  The incision was carried down to the breast tissue with electrocautery. A  very  large excision of the gynecomastia on the right side was undertaken  with the electrocautery. Wide skin  flaps were achieved and the dissection  was carried down to the chest muscle. A large amount of  breast tissue was  then removed off the chest muscle.   A separate specimen had to be taken to remove the tissue superior to the  areolar complex. Care was taken to preserve the blood supply to the areola.  Once the entire specimen of breast tissue was excised, the wound was  irrigated with saline. Hemostasis was achieved the cautery. The subcutaneous  tissue was then closed with interrupted 3-0 Vicryl sutures.   Likewise the skin  at the areola of the left nipple was anesthetized with 1%  Lidocaine. A circumareolar incision was then again created with a #15 blade.  The incision was carried down to the breast tissue, again with the  electrocautery. Skin flaps were then obtained superiorly and inferiorly and  the  breast tissue  was dissected free from  the overlying areolar complex.  A wide resection of the underlying gynecomastia was then undertaken, going  down to the chest wall. The entire specimen was then completely excised  using electrocautery.   Again, more of the superior tissue  had to be resected superiorly above the  nipple with the electrocautery. All specimens were sent to pathology for  identification. The left chest cavity was again irrigated with normal  saline. Hemostasis was achieved with the cautery.  The subcutaneous layers were closed with interrupted 3-0 Vicryl sutures as  well.  Both skin incisions were closed with running 4-0 Monocryl sutures. Steri-  Strips, gauze and pressure dressings were applied.   The patient tolerated the procedure well. All counts were correct at the end  of the procedure. The patient was then taken in guarded condition from the  operating room to the recovery room, where his pacemaker would be started  back.                                                Coralie Keens, M.D.    DB/MEDQ  D:  12/18/2003  T:  12/18/2003  Job:  [id number]

## 2011-04-18 NOTE — Discharge Summary (Signed)
NAME:  Glenn Gonzalez, Glenn Gonzalez NO.:  [id number]   MEDICAL RECORD NO.:  [id number]                   PATIENT TYPE:  OIB   LOCATION:  4705                                 FACILITY:  [facility]   PHYSICIAN:  Deboraha Sprang, M.D. [facility]           DATE OF BIRTH:  01/17/1961   DATE OF ADMISSION:  08/25/2002  DATE OF DISCHARGE:  08/26/2002                                 DISCHARGE SUMMARY   HISTORY OF PRESENT ILLNESS:  The patient is a 50 year old gentleman with a  past medical history of nonischemic cardiomyopathy potentially related to  chemo.  He underwent a catheterization and was found to have an LVEDP in the  35 range and was referred for evaluation by Dr. Lacretia Leigh at [facility] who  requested an EP evaluation.  EP testing was done and was negative.  He was  discharged and felt to be functionally well and in no need of an electric  device.  Since that time however, he has had intercurrent deterioration in  his functional capacity with shortness of breath at a couple hundred yards  as well as two syncopal episodes.  After further evaluation, Dr. Darden Dates  recommendation was made for biventricular ICD.  The patient was submitted  for a  T-wave alternans testing.  He was unable to accomplish a heart rate  of more than 110 because of profound light-headedness.  The test had to be  terminated prematurely and was also a fair amount of noise.  He was admitted  on August 25, 2002 for placement of a biventricular ICD.  He underwent  placement of a biventricular Guidant-type ICD.  He tolerated the procedure  well, had no immediate postop complications and was discharged the following  day in stable condition on the following medications.   DISCHARGE MEDICATIONS:  1. Coreg 25 mg twice a day.  2. Accupril 10 mg a day.  3. Synthroid 150 mcg daily.  4. Prozac 20 daily.  5. Pepcid 40 twice a day.  6. Lasix 40 a day.  7. Flonase one puff daily.  8. Claritin 10 daily.  9.  He was to take Tylenol No. 3 one tablet every 4-6 hours as needed.   DISCHARGE INSTRUCTIONS AND FOLLOWUP:  He was given a supplemental discharge  instruction sheet.  He was not to move his left arm above his head for 1  week and not to get his wound wet for 1 week.  He was to have a  BMET drawn and results sent to Dr. Ky Barban office within 1 week.  He was to  follow up at pacemaker clinic at [facility] on September 05, 2002 at 9 a.m., Dr.  [name] on October 10, 2002 at 2:30 p.m. and he was to call to schedule an  appointment with Dr. Melvern Banker within 1 week.     Forest Becker, C.R.N.P. [facility]  Deboraha Sprang, M.D. [facility]    DS/MEDQ  D:  08/26/2002  T:  08/29/2002  Job:  [id number]   cc:   [facility]   Bryson Dames, M.D.  [address] [place] [address]  Fax: [phone]

## 2011-04-18 NOTE — Op Note (Signed)
NAME:  Glenn Gonzalez, Glenn Gonzalez NO.:  [id number]   MEDICAL RECORD NO.:  [id number]                   PATIENT TYPE:  OIB   LOCATION:  4705                                 FACILITY:  [facility]   PHYSICIAN:  Glenn Gonzalez, M.D. [facility]           DATE OF BIRTH:  29-Dec-1960   DATE OF PROCEDURE:  08/25/2002  DATE OF DISCHARGE:                                 OPERATIVE REPORT   PREOPERATIVE DIAGNOSIS:  Congestive failure, broad QRS, nonischemic  cardiomyopathy and syncope, pretransplant.   POSTOPERATIVE DIAGNOSIS:  Congestive failure, broad QRS, nonischemic  cardiomyopathy and syncope, pretransplant.   PROCEDURE:  Biventricular defibrillator implantation with coronary sinus  stenography and intraoperative defibrillation threshold testing.   Following obtaining the informed consent, the patient was brought to the  electrophysiology laboratory and placed on the fluoroscopic table in the  supine position.  After routine prep and drape of the left upper chest,  lidocaine was infiltrated along the prepectoral subclavicular region  An  incision was made and carried down to the level of prepectoral fascia using  sharp dissection and electrocautery.  A pocket was formed.  Similarly  hemostasis was obtained.   Thereafter attention was turned to gaining access of the extrathoracic left  subclavian vein which was accomplished without difficulty without the  aspiration of air or puncture to the artery.  Three separate venipunctures  were accomplished and the two cephalad venipunctures were encased in  hemostatic anchoring suture which was allowed to hang loosely.   Subsequently a 9-French tear-away introducer sheath was placed through which  was passed in a Guidant model 0158 active fixation dual coil defibrillator  lead, serial [id number].  Under fluoroscopic guidance, it was manipulated into  the right ventricular apex where the bipolar R wave was 9.6 mV and a pacing  impendence  of 552 ohms and a pacing threshold of 1.1 volts at 0.5 msec.  Current threshold was 1.5 ma and there was no diaphragmatic pacing at 10  volts.   Prior to lead insertion of the device, some time later, the threshold was  0.4 volts at 0.5 msec.   With these acceptable parameters recorded, this lead was secured to the  prepectoral fascia.  Over the more caudal wire, then a 9.5-French hemostatic  sheath was placed through which was then passed a Guidant CS wide hook  coronary sinus cannulation sheath.  This, in conjunction with a Wholey wire,  allowed some cannulation of the coronary sinus.  Coronary sinus venography  was then obtained in the AP and LAO projection identifying a posterolateral  vein that had two lateral branches, as well as a middle cardiac vein.  Using  a Whisper DS wire and a Guidant [id number] left ventricular lead, serial  [id number], this posterolateral vein branch was cannulated and the lateral  ramification at the mid position of the ventricle was subsequently  subcannulated.  Distal cannulation was attempted first.  This was  associated  with diaphragmatic pacing.  The posterolateral branch extended somewhat more  medially than I had hoped so lateral ramification was sought and found.  The  unipolar left ventricular R wave was 12 MV with a pacing impedence of 516  ohms and a pacing threshold of 0.8 volts at 0.5 msec.  Current threshold was  0.9 ma and again there was no diaphragmatic pacing at 10 volts.   The hemostatic sheath was removed but the introducer sheath was left in  place.  The Whisper wire was removed.   Over the remaining retained guide wire a 7-French tear-away introducer  sheath was placed through which was then passed a Medtronic 5076, 52-cm  active fixation atrial lead, serial [id number].  Under fluoroscopic  guidance it was manipulated to the right atrial appendage where it was  placed, subsequently dislodged and then was replaced where the bipolar  P  wave was 2.8 MV with the pacing threshold immediately after lead deployment  of 2.3 volts at 0.5 msec.  Current threshold was 3.0 ma and impendence was  580 ohms.  With these acceptable parameters recorded, these leads were then  attached to the Newton triple chamber  defibrillator, serial [id number].  Through the device, the bipolar P wave was  2.4 MV with a pacing impendence of 1.0 volt at 0.5 msec.  Impedence was 613  ohms.  The RV amplitude was 14.8 mV with a pacing impedence of 641 ohms and  a pacing threshold of 0.2 volts at 0.5 msec and the LV amplitude was 4.5 mV  with a pacing threshold at 0.2 volts and a 0.5 msec and impedence was 578  ohms.  With these acceptable parameters recorded, defibrillation threshold  testing was undertaken.  The pocket was copiously irrigated with antibiotic-  containing saline solution.  Hemostasis was assured.  The leads all were  secured to the prepectoral fascia.  Ventricular fibrillation was induced via  the T wave shock.  After a total duration of 11 seconds, a 21 joule shock  was delivered through a measured resistance of 39 ohms terminating  ventricular fibrillation, restoring sinus rhythm.  This was accomplished at  lead sensitivity.   After waiting five to six minutes, ventricular fibrillation was reinduced  via the T wave shock.  After a total duration of 12 seconds, a 21 joule  shock was delivered through a measured resistance of 40 ohms terminating  ventricular fibrillation,  restoring sinus rhythm.  This was a nominal  sensitivity.   With these acceptable parameters recorded, the system was implanted.  The  pocket was again inspected for hemostasis, again irrigated with antibiotic  containing saline solution.  The defibrillator was affixed to the  prepectoral fascia and the wound was then closed in three layers in the normal fashion.  The wound was washed, dried and a Benzoin and Steri-Strip  dressing was  then applied.  The needle counts, sponge counts and instrument  counts were correct at the end of the procedure according to the staff.   COMPLICATIONS:  None apparent.                                               Glenn Gonzalez, M.D. [facility]    SCK/MEDQ  D:  08/25/2002  T:  08/26/2002  Job:  [id number]   cc:   Electrophysiology  Laboratory   Attn:  [facility] [place]   Glenn Gonzalez, M.D.  [phone] [address], [address] [place] [address]  Fax: [phone]   Lacretia Leigh

## 2011-04-18 NOTE — Assessment & Plan Note (Signed)
[facility] OFFICE NOTE   NAME:Glenn Gonzalez, Glenn Gonzalez                    MRN:          [id number]  DATE:01/19/2007                            DOB:          1961/06/07    This a return office visit for GERD and gastritis. He has run out of  Nexium and tried taking Prilosec 40 mg p.o. twice a day with p.r.n. Tums  and is now using Pepcid 40 mg b.i.d. with Tums. Both of these regimens  have not adequately controlled his symptoms and he feels that AcipHex  twice a day was much more effective. He has no dysphagia, odynophagia,  abdominal pain, change in bowel habits, melena or hematochezia.   CURRENT MEDICATIONS:  Listed on the chart updated and reviewed.   MEDICATION ALLERGIES:  None known.   PHYSICAL EXAMINATION:  GENERAL:  No acute distress.  VITAL SIGNS:  Weight 200, blood pressure is 88/56, pulse 60 and regular.  HEENT:  Anicteric sclera, oropharynx clear.  CHEST:  Clear to auscultation bilaterally.  CARDIAC:  Regular rate and rhythm without murmurs.  ABDOMEN:  Soft, nontender, nondistended, normal active bowel sounds, no  palpable organomegaly, masses or hernias.   ASSESSMENT/PLAN:  Gastroesophageal reflux disease and gastritis. Renew  AcipHex 20 mg p.o. b.i.d. for 1 year. Maintain standard antireflux  measures. Followup office visit in one year.     Pricilla Riffle. Glenn Gonzalez Plan, MD, [facility]  Electronically Signed    MTS/MedQ  DD: 01/19/2007  DT: 01/19/2007  Job #: [id number]   cc:   Dellis Filbert A. Sherren Mocha, MD

## 2011-04-18 NOTE — Cardiovascular Report (Signed)
[facility]  Patient:    Glenn Gonzalez, ORNE                   MRN: [id number] Proc. Date: 12/18/00 Attending:  Bryson Dames, M.D. CC:         Cardiac Catheterization Lab                        Cardiac Catheterization  PROCEDURES PERFORMED: 1. Selective coronary angiography by Judkins technique. 2. Retrograde left heart catheterization. 3. Left ventricular angiography. 4. Right heart catheterization.  CARDIOLOGIST:  Bryson Dames, M.D.  COMPLICATIONS:  None.  ENTRY SITE:  Right femoral.  DYE USED:  Omnipaque.  PATIENT PROFILE:  The patient is a 50 year old married gentleman who has had a 55-pound weight gain over the last several years and has had increased shortness of breath.  He is currently a Clinical cytogeneticist a psychology degree from the [employer] and plans on going into a doctoral program.  The patient previously had a Hodgkins lymphoma and underwent chemotherapy which included Adriamycin and as many as 13 to 14 years ago had ejection fractions as low as 20%.  His last measured ejection fraction by echocardiogram in office was in October 1999 and at that time was 30 to 35%.  The patients current medications include Lasix 40 mg a day, Coreg 25 mg twice a day, Accupril 10 mg twice a day, Synthroid 0.15 mg once daily, Prozac 20 mg a day, Claritin 10 mg a day, and potassium chloride 20 mEq a day.  The patient has a history of gynecomastia related to Lanoxin therapy.  Todays procedure was performed on an outpatient basis electively.  RESULTS:  PRESSURES:  The right atrial mean pressure was 14.  The right ventricular pressure was 43/16.  The pulmonary artery pressure was 42/25.  The pulmonary capillary mean wedge pressure was 27.  The A wave was 27; the V wave was 37.  The left ventricular pressure was 82/23.  The central aortic pressure was 85/58.  The cardiac index was 1.6 liters per minute per  meter squared by the thermodilution technique.  The cardiac output by the same technique was 3.7.  The coronary arteries were normal.  The left ventricular angiogram showed global hypokinesis of the left ventricle with an ejection fraction measured at 17%.  Mitral regurgitation was moderate.  FINAL DIAGNOSES: 1. Severe left ventricular dysfunction with ejection fraction of 17% compared    to last ejection fraction of 30 to 35% in October 1999. 2. History of Adriamycin-induced cardiomyopathy. 3. Mild pulmonary hypertension. 4. Low systemic pressures on beta blocker, ACE inhibitors, and diuretics.  PLAN:  Outpatient continuation of medication including addition of Lanoxin. DD:  02/26/01 TD:  02/27/01 Job: [id number]

## 2011-04-18 NOTE — H&P (Signed)
NAME:  KEGHAN, BAYES NO.:  [id number]   MEDICAL RECORD NO.:  [id number]          PATIENT TYPE:  INP   LOCATION:  2871                         FACILITY:  [facility]   PHYSICIAN:  Deboraha Sprang, M.D.  DATE OF BIRTH:  11-02-1961   DATE OF ADMISSION:  02/07/2005  DATE OF DISCHARGE:                                HISTORY & PHYSICAL   CARDIOLOGIST:  Bryson Dames, M.D.   ELECTROPHYSIOLOGIST:  Deboraha Sprang, M.D.   CHIEF COMPLAINT:  I am here for the procedure.   ALLERGIES:  AMPICILLIN which caused stomach pain.   HISTORY OF PRESENT ILLNESS:  Mr. Prendes is a 50 year old male with a  history of nonischemic cardiomyopathy which is secondary to Adriamycin  therapy for his nonHodgkins lymphoma.  He has a history of improving class 1  to 2 congestive heart failure.  He is able to walk without ever becoming  dyspneic.  He also has a history of syncope and ventricular tachycardia and  is status post implantation of Guidant biventricular ICD back in September  of 2003.  He also at that time underwent left heart catheterization in  August of 2003 which showed ejection fraction of 15%, moderate mitral  regurgitation, and normal coronary anatomy.  The patient's device is a  Guidant H135 is on recall.  The patient presents today for a generator  change.   MEDICATIONS:  1.  Coreg 25 mg twice daily.  2.  Accupril 10 mg daily.  3.  Synthroid 150 mcg daily.  4.  Lasix 80 mg daily.  5.  Aciphex 20 mg daily.   SOCIAL HISTORY:  The patient lives in [place] with his wife.  He does not  smoke.  He takes occasional alcoholic beverage.   FAMILY HISTORY:  Noncontributory.   REVIEW OF SYSTEMS:  The patient is not having any presyncope or syncopal  episodes lately.  He has never received therapy from his device.  Currently  not having chest pain or dyspnea.  No orthopnea, no paroxysmal nocturnal  dyspnea, no peripheral edema.  HEENT:  He is not having nasal discharge,  epistaxis, photophobia, or vertigo.  He has no ulcerations or nonhealing  lesions.  GASTROINTESTINAL:  He has acid reflux for which he takes Aciphex.  He has never had internal gastrointestinal bleeding.   PAST SURGICAL HISTORY:  Notable for implantation of biventricular ICD, a  Guidant device in September of 2003.  He is also status post bilateral  excision of gynecomastia.   PHYSICAL EXAMINATION:  VITAL SIGNS:  Temperature 97.9, blood pressure  122/74, pulse 70, respirations 20.  GENERAL:  Alert and oriented x3, in no acute distress.  LUNGS:  Clear to auscultation and percussion bilaterally.  HEART:  Regular rate and rhythm without murmur.  ABDOMEN:  Soft and nondistended.  Bowel sounds are present.  NECK:  Supple, no carotid bruits auscultated.  EXTREMITIES:  No evidence of edema.  Dorsalis pedis pulses are 4/4  bilaterally.  Radial pulses 4/4 bilaterally.  NEUROLOGY:  No neurologic deficits.   IMPRESSION:  1.  Admission for generator change from a Guidant generator to  Medtronic      ICD. This will entail a special coupler due to the nature of the Guidant      device which will have to be obtained from Guidant to go from a Guidant      coupler to a universal coupler prior to the procedure.  2.  History of nonischemic cardiomyopathy, ejection fraction 15% secondary      to Adriamycin-induced cardiomyopathy.  3.  History of nonHodgkins lymphoma.  4.  Congestive heart failure, class 1-2 symptoms.  5.  Hypothyroidism.   PLAN:  Generator change today.      GM/MEDQ  D:  02/07/2005  T:  02/07/2005  Job:  [id number]

## 2011-04-18 NOTE — Discharge Summary (Signed)
   NAME:  Glenn Gonzalez, Glenn Gonzalez                       ACCOUNT NO.:  [id number]   MEDICAL RECORD NO.:  [id number]                   PATIENT TYPE:  INP   LOCATION:  3702                                 FACILITY:  [facility]   PHYSICIAN:  Erlene Quan, P.A.                DATE OF BIRTH:  08/10/1961   DATE OF ADMISSION:  07/14/2002  DATE OF DISCHARGE:                                 DISCHARGE SUMMARY   DISCHARGE DIAGNOSES:  1. Severe non-ischemic cardiomyopathy.  2. Nonsustained ventricular tachycardia.  3. Right bundle branch block.  4. Treated hypothyroidism.  5. History of Hodgkin's lymphoma, status post chemotherapy and splenectomy     in the past.   HOSPITAL COURSE:  The patient is an unfortunate 50 year old male followed by  Dr. Melvern Banker and Dr. Christie Nottingham who had Adriamycin-induced cardiomyopathy with  a history of severely depressed ejection fraction.  He had Adriamycin  therapy in the mid-80s for Hodgkin's.  He has been seen by Dr. Lacretia Leigh, the cardiac transplant coordinator at [facility].  He was admitted by Dr.  Melvern Banker July 14, 2002 from the office for nonsustained ventricular  tachycardia.  The patient was admitted to telemetry and seen in consult by  Dr. Jolyn Nap for EP evaluation.  He indicates that there was no good data  to guide [place] regarding non-ischemic cardiomyopathy and syncope and  ventricular tachycardia.  Cardiac catheterization was done July 15, 2002  by Dr. Melvern Banker, cardiac index was 1.8, EF 15%.  Dr. Melvern Banker has discussed with  Dr. Lacretia Leigh at [facility] and transportation is being arranged.   CURRENT MEDICATIONS:  1. Prinivil 10 mg B.I.D  2. Synthroid 0.15 mg a day.  3. Aspirin 81 mg a day.  4. Coreg 25 mg B.I.D   LABORATORY DATA:  Sodium 136, potassium 4.0, BUN 33, creatinine 1.2.  White  count 9.2, hemoglobin 14.3, hematocrit 42.4, platelets 364.  TSH 2.59.   Chest x-ray:  Cardiomegaly, pulmonary venous hypertension without edema.  EKG reveals sinus  rhythm, left axis deviation, left bundle branch block, PR  interval 192, QTC 465.   DISPOSITION:  The patient is to be transferred to [facility] for further  evaluation.                                                 Erlene Quan, P.AMeryl Dare  D:  07/15/2002  T:  07/15/2002  Job:  [id number]

## 2011-04-18 NOTE — Op Note (Signed)
NAME:  CRISTAL, KINZER NO.:  [id number]   MEDICAL RECORD NO.:  [id number]            PATIENT TYPE:   LOCATION:                                 FACILITY:   PHYSICIAN:  Deboraha Sprang, M.D.       DATE OF BIRTH:   DATE OF PROCEDURE:  04/02/2005  DATE OF DISCHARGE:                                 OPERATIVE REPORT   PREOPERATIVE DIAGNOSIS:  Previously implanted device with concern regarding  pocket erosion and discomfort.   PROCEDURE:  Pocket division and device explantation for an Advisor recall  device and implantation of a new device.   DESCRIPTION OF PROCEDURE:  Following obtaining informed consent, the patient  was brought to the electrophysiology laboratory and placed on the  fluoroscopic table in the supine position.  After routine prep and drape of  the left upper chest, lidocaine was infiltrated in the prepectoral  subclavicular region.  Incision was made and carried down to the layer of  the previous pocket using electrocautery and sharp dissection.  Th pocket  was opened up.  It was elected to reimplant a Guidant device.  The pocket  was opened.  The pocket had to be expanded to allow for housing of the new  larger defibrillator.  This having been accomplished, the leads were then  attached to the new device.  The previously implanted atrial lead was a  Medtronic 5076 serial number [id number] and the P wave was 3 millivolts with  a pacing impedance of 446.  A threshold of 0.4 to 0.5 with a current of 1  MA.  The previously lead was a 0158, serial number [id number] with an R wave of  14.7, impedance of 655, threshold of 0.8 at 0.5 with a current threshold of  1.3 MA.  The LV impedance was sixth level and threshold of 0.5 and 0.5  within an R wave and 16.5 and a current of 1.1 MA.  The patient tolerated  the procedure well.  The pocket was copiously irrigated with antibiotic-  containing saline solution.  Hemostasis was assured and the leads and the  pulse  generator were then placed in the pocket with care given to make sure  that the pocket was plenty large to house all of the wires easily.   The patient tolerated the procedure without apparent complications.   It should be noted that this dictation is being done on May 3.  There is a  concern that has arisen subsequently to this.  I do not how this reflects  the procedure that there is oversensing.  I do not know whether it is  related to a set screw or a lead erosion.  We will have to wait and see what  we find when we revise the system again later this week.                                        ___________________________________________  Deboraha Sprang, M.D.    SCK/MEDQ  D:  04/02/2005  T:  04/03/2005  Job:  [id number]   cc:   [facility], attn Doyle Askew

## 2011-04-18 NOTE — Assessment & Plan Note (Signed)
[place]                           ELECTROPHYSIOLOGY OFFICE NOTE   NAME:Glenn Gonzalez, Glenn Gonzalez                    MRN:          [id number]  DATE:06/18/2006                            DOB:          1961-11-14    DEFIBRILLATOR NOTE:  Glenn Gonzalez was seen in the clinic on June 18, 2006  for followup of his Guidant model [phone] contact.  Date of implant was February 07, 2006 for ischemic cardiomyopathy.  On interrogation of his device today,  his battery voltage is 3.16 with a charge time of 6.1 seconds.  Q waves  measured 3.3 mV with an atrial capture threshold of 0.4 volts and 0.5 ms and  an atrial lead impedance of 441.  Right ventricular R waves measures 13.5 mV  with a right ventricular pacing threshold of 0.6 volts at 0.5 ms and a right  ventricular lead impedance of 575.  Left ventricular R waves measured 16.4  mV with a left ventricular pacing threshold of 0.6 volts at 0.5 ms and a  left ventricular lead impedance of 611.  Shock impedance was 43.  There were  5 non-sustained episodes and 15 episodes in the VT __________ therapy  program.  No changes were made in his parameters.  He was signed up for  latitude today, and will send a transmission in at 57, 6 and 9 months' time  with a return office visit in 12 months.                                   Alma Friendly, LPN                                Deboraha Sprang, MD, [facility]   PO/MedQ  DD:  06/18/2006  DT:  06/18/2006  Job #:  [id number]

## 2011-04-18 NOTE — H&P (Signed)
NAME:  Glenn Gonzalez, Glenn Gonzalez NO.:  [id number]   MEDICAL RECORD NO.:  [id number]          PATIENT TYPE:  INP   LOCATION:  2899                         FACILITY:  [facility]   PHYSICIAN:  Deboraha Sprang, M.D.  DATE OF BIRTH:  Sep 01, 1961   DATE OF ADMISSION:  04/08/2005  DATE OF DISCHARGE:                                HISTORY & PHYSICAL   PRESENTING CIRCUMSTANCE:  I am here to have my leads hooked up again.   HISTORY OF PRESENT ILLNESS:  Glenn Gonzalez is a 50 year old male with history  of Adriamycin-induced nonischemic cardiomyopathy. His Adriamycin was therapy  for non-Hodgkin's lymphoma. He has class I to class II congestive heart  failure symptoms. He has a history of syncope and concurrent ventricular  tachycardia and is status post implantation of a Guidant BiV ICD implanted  in September 2003. He had left heart catheterization August 2003 which  demonstrated an ejection fraction of 15%, moderate mitral regurgitation, and  normal coronary anatomy. He was recently admitted in March of this year for  a device recall and for a pocket revision. Guidant Contak Renewal [id number]  cardioverter defibrillator was attached to the distal leads. About two weeks  ago, the patient was doing pushups. On one upstroke, his device fired,  galvanizing his muscles; he fell to the floor, biting his tongue. Through  all of this, he felt fine. He presented to the office where it was thought a  lead had jarred loose. Chest x-ray was taken. The patient presents for  revision. The device was interrogated prior to the elective procedure,  interrogation demonstrating right ventricular lead is faulty.   ALLERGIES:  AMPICILLIN which causes stomach pain.   MEDICATIONS:  1.  Coreg 25 mg twice daily.  2.  Accupril 10 mg daily.  3.  Synthroid 150 mcg daily.  4.  Lasix 80 mg twice daily.  5.  Aciphex 20 mg daily.  6.  Flonase 1 spray per naris daily.   LABORATORY DATA:  White cells 7.2, hemoglobin  16.3, hematocrit 49.6,  platelets 289. Serum electrolytes:  Sodium 138, potassium 4.2, chloride 100,  bicarbonate 31, BUN 16, creatinine 1.1, glucose 99. PTT 34, PT 11.8, INR  1.0. TSH is 0.98.   SOCIAL HISTORY:  The patient lives in [place] with his wife. He has been  retired for the last seven years from [employer]. He does not  smoke. He takes occasional alcohol beverages. No recreational drugs.   FAMILY HISTORY:  Mother is living. She is hypothyroid. No diabetes. No  coronary artery disease in her family. Father is living. He has coronary  artery disease. Never had a heart attack. Status post coronary artery bypass  graft surgery two years ago.   REVIEW OF SYSTEMS:  CONSTITUTIONAL:  The patient denies the following:  Weight gain or loss, no night sweats, no fever or chills. HEENT:  The  patient denies nasal discharge, epistaxis, photophobia, diplopia,  hoarseness, and vertigo. INTEGUMENT:  The patient denies rashes and  nonhealing ulcerations. CARDIOPULMONARY:  The patient has no chest pain, no  dyspnea on exertion, no orthopnea, no paroxysmal nocturnal  dyspnea, no  dependent edema, no presyncope or syncope, no palpitations.  GASTROINTESTINAL:  No bright red blood per rectum, no melena. He does have  GERD symptoms controlled with Aciphex. He is not having nausea, vomiting, or  diarrhea. NEUROLOGICAL:  No anxiety or depression. No numbness. No  unilateral weakness. No neurological deficits. MUSCULOSKELETAL:  The patient  is not having any arthralgias except for left elbow pain which he has been  treating with hydrocodone.   PAST SURGICAL HISTORY:  1.  Implantation of BiV/ICD September 2003 with recall replacement in March      of 2006.  2.  Bilateral excision of gynecomastia.   PHYSICAL EXAMINATION:  GENERAL:  This is an alert and oriented male, well-  nourished, no acute distress.  VITAL SIGNS:  Temperature 97.0, blood pressure 92/50, pulse is 59 and  regular,  respirations are 20, oxygen saturation 97% on room air. He is 6  feet 1 inch and weighs 199 pounds.  HEENT:  Eyes:  Pupils are equal, round, and reactive to light. Extraocular  movements are intact. Sclera anicteric. No xanthomata noted.  NECK:  Supple. No carotid bruits. No thyromegaly. No jugular venous  distention.  CHEST:  Lungs clear to auscultation and percussion bilaterally. The ICD  incision from March is well healed with just minor swelling, and the patient  relates that this is not a tender area and has healed in well.  ABDOMEN:  Soft, nondistended, no hepatosplenomegaly, abdominal aorta  nonpulsatile.  NEUROLOGICAL:  No neurological deficits noted.  EXTREMITIES:  No clubbing, cyanosis, or edema. Dorsalis pulses 4/4  bilaterally, easily palpable. Radial pulses 4/4 bilaterally. Left elbow  although tender, no swelling or edema, no erythema.   IMPRESSION:  1.  Recent cardioverter defibrillator discharge secondary to faulty right      ventricular lead. This happened during vigorous exercise.  2.  Nonischemic cardiomyopathy, Adriamycin induced, ejection fraction 15% at      catheterization August 2003.  3.  Non-Hodgkin's lymphoma.  4.  Hypothyroidism.  5.  Status post BiV ICD for syncope/ventricular tachycardia.  6.  Status post generator change of Guidant Contak Renewal on recall March      2006 with pocket revision.   PLAN:  Lead adjustment today in the EP lab.      GM/MEDQ  D:  04/08/2005  T:  04/08/2005  Job:  [id number]

## 2011-04-18 NOTE — Discharge Summary (Signed)
NAME:  Glenn Gonzalez, Glenn Gonzalez NO.:  [id number]   MEDICAL RECORD NO.:  [id number]          PATIENT TYPE:  INP   LOCATION:  2871                         FACILITY:  [facility]   PHYSICIAN:  Sueanne Margarita, P.A. DATE OF BIRTH:  September 20, 1961   DATE OF ADMISSION:  02/07/2005  DATE OF DISCHARGE:  02/07/2005                                 DISCHARGE SUMMARY   DISCHARGE DIAGNOSES:  1.  History of mild ischemic cardiomyopathy secondary to Adriamycin-induced      cardiomyopathy as therapy for non-Hodgkin's lymphoma.  2.  Class I to Class II congestive heart failure.  3.  History of syncope/ventricular tachycardia.  4.  Status post implantation of Guidant BiV ICD, September 2003.  5.  Discharging the day of generator change with implantation of Guidant      Contak space renewal, space 3, model H175, ICD generator with DDD      capability.   SECONDARY DIAGNOSIS:  Status post left heart catheterization, August 2003.  Ejection fraction 15%, moderate mitral regurgitation, normal coronary  arteries.   PROCEDURE:  February 07, 2005:  Implantation of Guidant Contak renewal space 3  model [id number] DDD ICD generator by Dr. Virl Axe as part of recall.   DISCHARGE DISPOSITION:  [name] discharging the same day of the  procedure after undergoing implantation of a new ICD generator.  He is  having mild pain for which he will get Darvocet N-100 1-2 tabs every 3-4  hours as needed.  He is to continue his pre-hospitalization medications.  He  is asked to remove his bandage in the morning, Saturday, March 11th.  He is  asked to keep the incision dry for the next seven days, sponge bathe until  Friday, March 17th.  He is to follow up with [facility] Wednesday, March 22nd, at 10:45.   BRIEF HISTORY:  [name] is a 50 year old male with a history of  nonischemic cardiomyopathy.  He has functional congestive heart failure,  Class I.  He has a history of syncope and  ventricular tachycardia and is  status post implant of BiV ICD in September of 2003.  It is on the recall  list currently.  He had a left heart catheterization in August 2003, which  showed an ejection fraction of 15%, moderate mitral regurgitation, and  normal coronary arteries.  His cardiomyopathy is an Adriamycin-induced  cardiomyopathy.  He has a history of non-Hodgkin's lymphoma.  He also is  status post bilateral excision of gynecomastia by Plastic Surgery.  The  patient presents for elective generator change March 10th.   HOSPITAL COURSE:  The patient presented electively for a generator change.  At first, the patient requested a different generator, but we were unable to  provide the interface needed to go from a Guidant system to a Medtronic  system.  Therefore, the new Guidant Contak renewal device was implanted  without difficulty.  There is no acute hematoma, ecchymosis, or erythema at  the incision site.  The patient will return in two weeks for an incision  check and is given a prescription  for Darvocet for pain.      GM/MEDQ  D:  02/07/2005  T:  02/08/2005  Job:  [id number]   cc:   Bryson Dames, M.D.  [address] [place] [address]  Fax: [phone]

## 2011-04-18 NOTE — Op Note (Signed)
NAME:  LENVILLE, RINKE NO.:  [id number]   MEDICAL RECORD NO.:  [id number]          PATIENT TYPE:  INP   LOCATION:  2899                         FACILITY:  [facility]   PHYSICIAN:  Deboraha Sprang, M.D.  DATE OF BIRTH:  1961/01/27   DATE OF PROCEDURE:  04/08/2005  DATE OF DISCHARGE:                                 OPERATIVE REPORT   PREOPERATIVE DIAGNOSIS:  Ventricular oversensing from __________ potential,  resulting in inappropriate shock.   POSTOPERATIVE DIAGNOSIS:  Inadvertant malpositioning of the distal and  proximal coils.   PROCEDURES:  1.  Pocket exploration.  2.  Culture of the pocket.  3.  Repositioning of coils.   DESCRIPTION OF PROCEDURE:  Following the attainment of informed consent, the  patient was brought to the electrophysiology laboratory and placed on the  fluoroscopic table in the supine position.  After routine prep and drape,  lidocaine was infiltrated in line with the previous incision and carried  down to the layer of the device pocket using sharp dissection.  The skin is  not very thick overlying, although it looks much better than it did before.   As we opened up the pocket, there was some serous fluid that came out that  was quite clear.   Subsequently as I pulled out the device, there was some yellowish material  surrounding the leads.  This was cultured and sent.  It was found in fact  that the proximal and distal coils were in distal and proximal coil headers,  respectively.  They were removed and repositioned.  The high-voltage  impedence was 43 ohms.   The pocket was copiously irrigated with antibiotic-containing saline  solution.  Hemostasis was assured in the leads and the pulse generator was  then placed back in the pocket.  The wound was closed in three layers in a  normal  fashion.  The wound was washed and dried.  A Benzoin and Steri-Strip  dressing was applied.  The needle counts, sponge counts and instrument  counts  were correct at the end of the procedure, according to the staff.  The patient tolerated the procedure well without apparent complication.      SCK/MEDQ  D:  04/08/2005  T:  04/08/2005  Job:  [id number]   cc:   Bryson Dames, M.D.  [address] [place] [address]  Fax: [phone]   Electrophysiology Laboratory   [facility]

## 2011-05-29 ENCOUNTER — Ambulatory Visit: Payer: Managed Care, Other (non HMO) | Admitting: Internal Medicine

## 2011-05-29 ENCOUNTER — Ambulatory Visit (INDEPENDENT_AMBULATORY_CARE_PROVIDER_SITE_OTHER): Payer: Managed Care, Other (non HMO) | Admitting: Internal Medicine

## 2011-05-29 ENCOUNTER — Encounter: Payer: Self-pay | Admitting: Internal Medicine

## 2011-05-29 DIAGNOSIS — I472 Ventricular tachycardia: Secondary | ICD-10-CM

## 2011-05-29 DIAGNOSIS — I5022 Chronic systolic (congestive) heart failure: Secondary | ICD-10-CM

## 2011-05-29 DIAGNOSIS — Z95810 Presence of automatic (implantable) cardiac defibrillator: Secondary | ICD-10-CM

## 2011-05-29 DIAGNOSIS — I428 Other cardiomyopathies: Secondary | ICD-10-CM

## 2011-05-29 NOTE — Assessment & Plan Note (Signed)
He has significant fatigue. Based on the AV optimization program at the [facility] programmer I have increased his AV delayto 110 ms. We will give him about a month and then undertake AV optimization echo.

## 2011-05-29 NOTE — Assessment & Plan Note (Signed)
No recurrent ventricular tachycardia

## 2011-05-29 NOTE — Patient Instructions (Addendum)
Your physician has recommended that you have an AV optimization echo in 1 month. During this procedure, an echocardiogram is performed to optimize the timing of your device using ultrasound and a device programmer. Changes will be made to the device settings to help the heart chambers pump more efficiently. This procedure takes approximately one hour.  Your physician recommends that you schedule a follow-up appointment in: 2 months with Dr. Haroldine Laws.   Your physician wants you to follow-up in: 1 year with Dr. [name]. You will receive a reminder letter in the mail two months in advance. If you don't receive a letter, please call our office to schedule the follow-up appointment.  Your physician recommends that you continue on your current medications as directed. Please refer to the Current Medication list given to you today.

## 2011-05-29 NOTE — Progress Notes (Signed)
HPI  Glenn Gonzalez is a 50 y.o. male In in followup for congestive heart failure in the setting of nonischemic cardiomyopathy possibly related to chemotherapy and radiation therapy with depressed left ventricular function and EF o  25% . He is s/p CRT-D And recently underwent generator replacement  His had profound fatigue. Inability to tolerate up titration of his medications including beta blockers been precluded. He has previously undergone AV optimization echo with marked shortening of his AV delay down to about 80 ms to allow for full capture.   PMHx notable for Hodgkins lymphona (treated age 12-23) Treated with high dose chemo - including adriamycin and XRT. Also has AF, VT, PUD and asthma. Developed NICM shortly thereafter. Initial EF 13%. Seen at [facility] transplant clinic much later and felt to be doing too well.   Last echo at [facility] recently and: EF < 25% mild MR. Myoview 4/07 EF 35% no ischemia or scar. Recently underwent generator changeout of his ICD with Dr. [name].  Has not been able to tolerate spironolactone or Inspra due to painful gynecomastia requiring surgery.   Previously in HF-ACTION with pVO2 in mid to low 20s. Recovered from foot fracture earlier this year. Saw Richardson Dopp in March for transient volume overload which responded to additional lasix. Also noted that HRs were high so low-dose Toprol added to Coreg. Unable to tolerate due to profound fatigue so stopped.  Past Medical History  Diagnosis Date  . CHF (congestive heart failure)   . Hodgkin's lymphoma 1980    IIIB  . A-fib   . GERD (gastroesophageal reflux disease)   . Asthma   . Pacemaker 2011    [place] device/2011  . Hypothyroidism   . Depression   . Pneumonia     recurrent pneumonia sedf.  rad tx for lymphoma    Past Surgical History  Procedure Date  . Neck biopses     x5  . Cholecystectomy   . Exploratory laparotomy   . Permanent pacemaker 2011    [employer]  COGNIS device  . Gynemastia   . Portacath placement   . Tonsillectomy     Current Outpatient Prescriptions  Medication Sig Dispense Refill  . acetaminophen (TYLENOL) 500 MG tablet Take 500 mg by mouth as needed. 1 tab at bed time for back pain       . carvedilol (COREG) 12.5 MG tablet Take 1 tablet (12.5 mg total) by mouth 2 (two) times daily with a meal.  180 tablet  3  . furosemide (LASIX) 40 MG tablet Take 40 mg by mouth 2 (two) times daily.        Marland Kitchen levothyroxine (SYNTHROID) 150 MCG tablet Take 1 tablet (150 mcg total) by mouth daily.  90 tablet  3  . naproxen sodium (ANAPROX) 220 MG tablet Take 220 mg by mouth as needed. 1 tab at bedtime for back pain as needed       . quinapril (ACCUPRIL) 10 MG tablet Take 1 tablet (10 mg total) by mouth 2 (two) times daily.  180 tablet  3  . RABEprazole (ACIPHEX) 20 MG tablet Take 1 tablet (20 mg total) by mouth 2 (two) times daily.  180 tablet  3  . sertraline (ZOLOFT) 50 MG tablet Take 25 mg by mouth daily.        Marland Kitchen DISCONTD: sertraline (ZOLOFT) 50 MG tablet 1/2 tablet for 1 week. Then 1 tablet daily  30 tablet  11    Allergies  Allergen  Reactions  . Milk-Related Compounds   . Penicillins     Review of Systems negative except from HPI and PMH  Physical Exam Well developed and well nourished in no acute distress HENT normal E scleral and icterus clear Neck Supple JVP flat; carotids brisk and full Clear to ausculation Regular rate and rhythm,PMI displaced and sustained Soft with active bowel sounds No clubbing cyanosis and edema Alert and oriented, grossly normal motor and sensory function Skin Warm and Dry     Assessment and  Plan

## 2011-05-29 NOTE — Assessment & Plan Note (Signed)
The patient's device was interrogated and the information was fully reviewed.  The device was reprogrammed to Length in the AV delay from 80-110 ms

## 2011-06-10 ENCOUNTER — Encounter: Payer: Self-pay | Admitting: Internal Medicine

## 2011-06-11 ENCOUNTER — Ambulatory Visit: Payer: Managed Care, Other (non HMO) | Admitting: Internal Medicine

## 2011-06-30 ENCOUNTER — Ambulatory Visit: Payer: Managed Care, Other (non HMO) | Admitting: Internal Medicine

## 2011-07-02 ENCOUNTER — Encounter: Payer: Self-pay | Admitting: Internal Medicine

## 2011-07-02 ENCOUNTER — Ambulatory Visit (INDEPENDENT_AMBULATORY_CARE_PROVIDER_SITE_OTHER): Payer: Managed Care, Other (non HMO) | Admitting: *Deleted

## 2011-07-02 ENCOUNTER — Ambulatory Visit (HOSPITAL_COMMUNITY): Payer: Managed Care, Other (non HMO) | Attending: Internal Medicine | Admitting: Radiology

## 2011-07-02 ENCOUNTER — Encounter: Payer: Managed Care, Other (non HMO) | Admitting: Internal Medicine

## 2011-07-02 DIAGNOSIS — I5022 Chronic systolic (congestive) heart failure: Secondary | ICD-10-CM

## 2011-07-02 DIAGNOSIS — I4891 Unspecified atrial fibrillation: Secondary | ICD-10-CM | POA: Insufficient documentation

## 2011-07-02 DIAGNOSIS — Z95810 Presence of automatic (implantable) cardiac defibrillator: Secondary | ICD-10-CM

## 2011-07-02 DIAGNOSIS — I428 Other cardiomyopathies: Secondary | ICD-10-CM | POA: Insufficient documentation

## 2011-07-02 DIAGNOSIS — I509 Heart failure, unspecified: Secondary | ICD-10-CM | POA: Insufficient documentation

## 2011-07-02 DIAGNOSIS — Z95818 Presence of other cardiac implants and grafts: Secondary | ICD-10-CM | POA: Insufficient documentation

## 2011-07-02 DIAGNOSIS — I472 Ventricular tachycardia: Secondary | ICD-10-CM

## 2011-07-03 NOTE — Progress Notes (Signed)
Av opt

## 2011-07-07 ENCOUNTER — Encounter: Payer: Self-pay | Admitting: Internal Medicine

## 2011-07-09 LAB — ICD DEVICE OBSERVATION
BAMS-0002: 8 ms
TZAT-0001FASTVT: 1
TZAT-0001SLOWVT: 1
TZAT-0002SLOWVT: NEGATIVE
TZAT-0004FASTVT: 8
TZAT-0012FASTVT: 200 ms
TZAT-0018FASTVT: NEGATIVE
TZAT-0018SLOWVT: NEGATIVE
TZAT-0020FASTVT: 1 ms
TZON-0003FASTVT: 300 ms
TZON-0003SLOWVT: 429 ms
TZON-0004SLOWVT: 15
TZON-0005FASTVT: 1
TZON-0005SLOWVT: 1
TZST-0001FASTVT: 3
TZST-0001FASTVT: 6
TZST-0001FASTVT: 8
TZST-0001SLOWVT: 5
TZST-0001SLOWVT: 6
TZST-0002SLOWVT: NEGATIVE
TZST-0003FASTVT: 41 J
TZST-0003FASTVT: 41 J

## 2011-07-17 ENCOUNTER — Other Ambulatory Visit: Payer: Self-pay | Admitting: Internal Medicine

## 2011-07-17 NOTE — Telephone Encounter (Signed)
Pt needs med called into [facility] [address]

## 2011-07-18 ENCOUNTER — Other Ambulatory Visit: Payer: Self-pay | Admitting: *Deleted

## 2011-07-18 MED ORDER — QUINAPRIL HCL 10 MG PO TABS: 10.0000 mg | ORAL_TABLET | Freq: Two times a day (BID) | ORAL | Status: DC

## 2011-08-28 ENCOUNTER — Encounter: Payer: Managed Care, Other (non HMO) | Admitting: *Deleted

## 2011-09-04 ENCOUNTER — Encounter (HOSPITAL_COMMUNITY): Payer: Managed Care, Other (non HMO)

## 2011-09-08 ENCOUNTER — Ambulatory Visit (HOSPITAL_COMMUNITY)
Admission: RE | Admit: 2011-09-08 | Discharge: 2011-09-08 | Disposition: A | Discharge status: Home / Self Care | Payer: Managed Care, Other (non HMO) | Source: Ambulatory Visit | Attending: Internal Medicine | Admitting: Internal Medicine

## 2011-09-08 VITALS — BP 80/60 | HR 78 | Wt 218.5 lb

## 2011-09-08 DIAGNOSIS — I5022 Chronic systolic (congestive) heart failure: Secondary | ICD-10-CM | POA: Insufficient documentation

## 2011-09-08 NOTE — Progress Notes (Signed)
  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 related to chemo/XRT) with EF 25% s/p CRT-D previously followed by Dr. Melvern Banker.  PMHx notable for Hodgkins lymphona (treated age [age]) Treated with high dose chemo - including adriamycin and XRT. Also has AF, VT, PUD and asthma. Developed NICM shortly thereafter. Initial EF 13%. Seen at [facility] transplant clinic much later and felt to be doing too well.   Last echo at [facility] recently and: EF < 25% mild MR. Myoview 4/07 EF 35% no ischemia or scar. Recently underwent generator changeout of his ICD with Dr. [name].  Has not been able to tolerate spironolactone or Inspra due to painful gynecomastia requiring surgery.   Previously in HF-ACTION with pVO2 in mid to low 20s. Recovered from foot fracture earlier this year. Saw Richardson Dopp in March for transient volume overload which responded to additional lasix. Also noted that HRs were high so low-dose Toprol added to Coreg. Unable to tolerate due to profound fatigue so stopped.  Stopped statin due to myalgias. 07/02/2011 ECHO 25%   Here for follow up. Feels ok in the morning but SOB after going to the gym and remains SOB the remainder of the day. Weight at home 210. He does not require extra Lasix. Able to sleep on one pillow. No PND.   SBP 80-90s. Denies dizziness.   ROS: All systems negative except as listed in HPI, PMH and Problem List.  Past Medical History  Diagnosis Date  . CHF (congestive heart failure)   . Hodgkin's lymphoma 1980    IIIB  . A-fib   . GERD (gastroesophageal reflux disease)   . Asthma   . Pacemaker 2011    [place] device/2011  . Hypothyroidism   . Depression   . Pneumonia     recurrent pneumonia sedf.  rad tx for lymphoma    Current Outpatient Prescriptions  Medication Sig Dispense Refill  . acetaminophen (TYLENOL) 500 MG tablet Take 500 mg by mouth as needed. 1 tab at bed time for back pain       . carvedilol (COREG) 12.5 MG tablet  Take 1 tablet (12.5 mg total) by mouth 2 (two) times daily with a meal.  180 tablet  3  . furosemide (LASIX) 40 MG tablet Take 40 mg by mouth 2 (two) times daily.        Marland Kitchen levothyroxine (SYNTHROID) 150 MCG tablet Take 1 tablet (150 mcg total) by mouth daily.  90 tablet  3  . quinapril (ACCUPRIL) 10 MG tablet Take 1 tablet (10 mg total) by mouth 2 (two) times daily.  180 tablet  3  . RABEprazole (ACIPHEX) 20 MG tablet Take 1 tablet (20 mg total) by mouth 2 (two) times daily.  180 tablet  3     PHYSICAL EXAM: Filed Vitals:   09/08/11 1058  BP: 80/60  Pulse: 78   General:  Well appearing. No resp difficulty HEENT: normal Neck: supple. JVP flat. Carotids 2+ bilaterally; no bruits. No lymphadenopathy or thryomegaly appreciated. Cor: PMI normal. Regular rate & rhythm. No rubs, gallops or murmurs. Lungs: clear Abdomen: soft, nontender, nondistended. No hepatosplenomegaly. No bruits or masses. Good bowel sounds. Extremities: no cyanosis, clubbing, rash, edema Neuro: alert & orientedx3, cranial nerves grossly intact. Moves all 4 extremities w/o difficulty. Affect pleasant.    ASSESSMENT & PLAN:

## 2011-09-08 NOTE — Patient Instructions (Signed)
Please continue to weigh daily  If your weight increases 3 pounds over 24 hours please contact HF clinic [phone]  Follow up in 4 months

## 2011-09-08 NOTE — Assessment & Plan Note (Addendum)
Stable III.  Recent CPX with pVO2 17.He continues to tolerate exercise 5 days a week.  Volume status stable. Unable to tolerate further medication titration due to low BP. Repeat CPX test in a year. Discussed trajectory of advanced therapies.  Follow up 4 months.  Patient seen and examined with Darrick Grinder, NP. We discussed all aspects of the encounter. I agree with the assessment and plan as stated above.

## 2011-09-24 ENCOUNTER — Telehealth: Payer: Self-pay | Admitting: Internal Medicine

## 2011-09-24 NOTE — Telephone Encounter (Signed)
Tried to call several times to get more information but no answer, left msg also to call back, will forward to [employer] RN to attempt contact.

## 2011-09-24 NOTE — Telephone Encounter (Signed)
Pt would like to speak to someone about changing his medications.  Please call him back at [phone].

## 2011-09-25 ENCOUNTER — Telehealth: Payer: Self-pay

## 2011-09-25 NOTE — Telephone Encounter (Signed)
Mr Glenn Gonzalez called wondering if the change in his Carvedilol made him more sob and raised his heart rate.  He states he was on 25mg  bid prior to his fracture and it was decreased to 12.5mg  bid after.

## 2011-09-25 NOTE — Telephone Encounter (Signed)
Pt was notified.  

## 2011-09-25 NOTE — Telephone Encounter (Signed)
If BP can tolerate would increase carvedilol to 18.75 bid

## 2011-09-25 NOTE — Telephone Encounter (Signed)
See phone note dated 10/25

## 2011-10-10 ENCOUNTER — Encounter: Payer: Self-pay | Admitting: Internal Medicine

## 2011-10-10 ENCOUNTER — Ambulatory Visit (INDEPENDENT_AMBULATORY_CARE_PROVIDER_SITE_OTHER): Payer: Managed Care, Other (non HMO) | Admitting: Internal Medicine

## 2011-10-10 DIAGNOSIS — I472 Ventricular tachycardia: Secondary | ICD-10-CM

## 2011-10-10 DIAGNOSIS — Z95810 Presence of automatic (implantable) cardiac defibrillator: Secondary | ICD-10-CM

## 2011-10-10 DIAGNOSIS — I428 Other cardiomyopathies: Secondary | ICD-10-CM

## 2011-10-10 DIAGNOSIS — I5022 Chronic systolic (congestive) heart failure: Secondary | ICD-10-CM

## 2011-10-10 LAB — ICD DEVICE OBSERVATION
AL AMPLITUDE: 3.3 mv
BAMS-0003: 70 {beats}/min
LV LEAD AMPLITUDE: 15.5 mv
RV LEAD AMPLITUDE: 21 mv
RV LEAD THRESHOLD: 0.7 V
TZAT-0001FASTVT: 2
TZAT-0001SLOWVT: 1
TZAT-0001SLOWVT: 2
TZAT-0002FASTVT: NEGATIVE
TZAT-0002SLOWVT: NEGATIVE
TZAT-0004FASTVT: 8
TZAT-0005FASTVT: 81 pct
TZAT-0018FASTVT: NEGATIVE
TZAT-0020FASTVT: 1 ms
TZON-0003FASTVT: 300 ms
TZON-0005FASTVT: 1
TZON-0005SLOWVT: 1
TZST-0001FASTVT: 4
TZST-0001FASTVT: 5
TZST-0001FASTVT: 6
TZST-0001FASTVT: 7
TZST-0001SLOWVT: 3
TZST-0001SLOWVT: 6
TZST-0001SLOWVT: 7
TZST-0002SLOWVT: NEGATIVE
TZST-0003FASTVT: 41 J
TZST-0003FASTVT: 41 J
TZST-0003FASTVT: 41 J
TZST-0003FASTVT: 41 J

## 2011-10-10 MED ORDER — CARVEDILOL 25 MG PO TABS: 25.0000 mg | ORAL_TABLET | Freq: Two times a day (BID) | ORAL | Status: DC

## 2011-10-10 NOTE — Patient Instructions (Addendum)
Your physician has recommended you make the following change in your medication:  1) Increase Coreg (carvedilol) to 25mg  one tablet by mouth twice daily. (faxed via EPIC to [employer]). 2) Decrease Lasix (furosemide) to 40mg  one tablet by mouth twice daily.  Your physician recommends that you schedule a follow-up appointment in:  3 months with Kristin/ Nevin Bloodgood for a device check.  Your physician wants you to follow-up in: 1 year with Dr. [name]. You will receive a reminder letter in the mail two months in advance. If you don't receive a letter, please call our office to schedule the follow-up appointment.

## 2011-10-10 NOTE — Assessment & Plan Note (Signed)
Medications as above

## 2011-10-10 NOTE — Assessment & Plan Note (Signed)
Relatively stable. He would like to adjust his medications and with the up titration of his carvedilol. This is reasonable we will also down titrate his diuretic.  Metabolic profile March 0000000 are normal creatinine and potassium

## 2011-10-10 NOTE — Assessment & Plan Note (Signed)
The patient's device was interrogated.  The information was reviewed. No changes were made in the programming.    

## 2011-10-10 NOTE — Assessment & Plan Note (Signed)
Some nonsustained ventricular tachycardia at a frequency of one every other day; stable

## 2011-10-10 NOTE — Progress Notes (Signed)
  HPI  Glenn Gonzalez is a 50 y.o. male  In in followup for congestive heart failure in the setting of nonischemic cardiomyopathy possibly related to chemotherapy and radiation therapy with depressed left ventricular function and EF o 25% . He is s/p CRT-D  Isn't seen in the heart failure clinic with drug adjustments. He is feeling somewhat better on the higher doses of carvedilol which is associated with a somewhat slower heart rate.    Past Medical History  Diagnosis Date  . CHF (congestive heart failure)   . Hodgkin's lymphoma 1980    IIIB  . A-fib   . GERD (gastroesophageal reflux disease)   . Asthma   . Pacemaker 2011    [place] device/2011  . Hypothyroidism   . Depression   . Pneumonia     recurrent pneumonia sedf.  rad tx for lymphoma    Past Surgical History  Procedure Date  . Neck biopses     x5  . Cholecystectomy   . Exploratory laparotomy   . Permanent pacemaker 2011    [employer] COGNIS device  . Gynemastia   . Portacath placement   . Tonsillectomy     Current Outpatient Prescriptions  Medication Sig Dispense Refill  . acetaminophen (TYLENOL) 500 MG tablet Take 500 mg by mouth as needed. 1 tab at bed time for back pain       . carvedilol (COREG) 12.5 MG tablet Take 18.75 mg by mouth 2 (two) times daily with a meal.        . furosemide (LASIX) 40 MG tablet Take 60 mg by mouth 2 (two) times daily.       Marland Kitchen levothyroxine (SYNTHROID) 150 MCG tablet Take 1 tablet (150 mcg total) by mouth daily.  90 tablet  3  . quinapril (ACCUPRIL) 10 MG tablet Take 1 tablet (10 mg total) by mouth 2 (two) times daily.  180 tablet  3  . RABEprazole (ACIPHEX) 20 MG tablet Take 1 tablet (20 mg total) by mouth 2 (two) times daily.  180 tablet  3    Allergies  Allergen Reactions  . Milk-Related Compounds   . Penicillins     Review of Systems negative except from HPI and PMH  Physical Exam Well developed and well nourished in no acute  distress HENT normal E scleral and icterus clear Neck Supple and quite asymmetric JVP flat; carotids brisk and full Clear to ausculation Regular rate and rhythm, no murmurs gallops PMI displaced Soft with active bowel sounds No clubbing cyanosis and edema Alert and oriented, grossly normal motor and sensory function Skin Warm and Dry     Assessment and  Plan

## 2011-10-30 NOTE — Telephone Encounter (Signed)
Please close encounter

## 2012-01-05 ENCOUNTER — Encounter: Payer: Self-pay | Admitting: Gastroenterology

## 2012-01-08 ENCOUNTER — Ambulatory Visit (HOSPITAL_COMMUNITY): Payer: Managed Care, Other (non HMO)

## 2012-01-12 ENCOUNTER — Ambulatory Visit (INDEPENDENT_AMBULATORY_CARE_PROVIDER_SITE_OTHER): Payer: Managed Care, Other (non HMO) | Admitting: *Deleted

## 2012-01-12 ENCOUNTER — Encounter: Payer: Self-pay | Admitting: Internal Medicine

## 2012-01-12 DIAGNOSIS — I5022 Chronic systolic (congestive) heart failure: Secondary | ICD-10-CM

## 2012-01-12 DIAGNOSIS — I428 Other cardiomyopathies: Secondary | ICD-10-CM

## 2012-01-12 DIAGNOSIS — I472 Ventricular tachycardia, unspecified: Secondary | ICD-10-CM

## 2012-01-12 LAB — ICD DEVICE OBSERVATION
AL IMPEDENCE ICD: 435 Ohm
AL THRESHOLD: 0.6 V
ATRIAL PACING ICD: 1 pct
BAMS-0001: 170 {beats}/min
BAMS-0002: 8 ms
DEVICE MODEL ICD: 588967
LV LEAD AMPLITUDE: 12.1 mv
LV LEAD IMPEDENCE ICD: 579 Ohm
LV LEAD THRESHOLD: 0.6 V
RV LEAD IMPEDENCE ICD: 519 Ohm
RV LEAD THRESHOLD: 0.7 V
TZAT-0001FASTVT: 1
TZAT-0001SLOWVT: 2
TZAT-0002FASTVT: NEGATIVE
TZAT-0005FASTVT: 81 pct
TZAT-0012FASTVT: 200 ms
TZAT-0018FASTVT: NEGATIVE
TZAT-0019FASTVT: 5 V
TZON-0003FASTVT: 300 ms
TZON-0003SLOWVT: 429 ms
TZST-0001FASTVT: 4
TZST-0001FASTVT: 5
TZST-0001FASTVT: 7
TZST-0001SLOWVT: 4
TZST-0001SLOWVT: 6
TZST-0002SLOWVT: NEGATIVE
TZST-0002SLOWVT: NEGATIVE
TZST-0002SLOWVT: NEGATIVE
TZST-0003FASTVT: 41 J
TZST-0003FASTVT: 41 J
TZST-0003FASTVT: 41 J

## 2012-01-12 NOTE — Progress Notes (Signed)
ICD check

## 2012-01-28 ENCOUNTER — Ambulatory Visit (HOSPITAL_COMMUNITY)
Admission: RE | Admit: 2012-01-28 | Discharge: 2012-01-28 | Disposition: A | Discharge status: Home / Self Care | Payer: Managed Care, Other (non HMO) | Source: Ambulatory Visit | Attending: Internal Medicine | Admitting: Internal Medicine

## 2012-01-28 VITALS — BP 86/58 | HR 72 | Wt 214.8 lb

## 2012-01-28 DIAGNOSIS — Z95810 Presence of automatic (implantable) cardiac defibrillator: Secondary | ICD-10-CM

## 2012-01-28 DIAGNOSIS — I5022 Chronic systolic (congestive) heart failure: Secondary | ICD-10-CM | POA: Insufficient documentation

## 2012-01-28 DIAGNOSIS — I428 Other cardiomyopathies: Secondary | ICD-10-CM

## 2012-01-28 DIAGNOSIS — I472 Ventricular tachycardia: Secondary | ICD-10-CM

## 2012-01-28 MED ORDER — CARVEDILOL 25 MG PO TABS: 25.0000 mg | ORAL_TABLET | Freq: Two times a day (BID) | ORAL | Status: DC

## 2012-01-28 MED ORDER — QUINAPRIL HCL 10 MG PO TABS: 10.0000 mg | ORAL_TABLET | Freq: Two times a day (BID) | ORAL | Status: DC

## 2012-01-28 MED ORDER — FUROSEMIDE 40 MG PO TABS: 40.0000 mg | ORAL_TABLET | Freq: Two times a day (BID) | ORAL | Status: DC

## 2012-01-28 NOTE — Assessment & Plan Note (Addendum)
NYHA II. Volume status stable. He has lost 4 pounds on gluten free diet. It will difficult to manage volume status due to soft SBP. Recommended obtaining new blood pressure cuff. He will continue to take lasix 40 mg twice a day. He will not take lasix if his SBP is less than 90 or if he is dizzy. Will obtain BMET today and repeat in 4 weeks.  Follow up in 3-4 months.   Patient seen and examined with Darrick Grinder, NP. We discussed all aspects of the encounter. I agree with the assessment and plan as stated above. Volume status looks good on exam. Had long discussion about how to adjust diuretics in setting of recent weight loss and reduced diuretic requirement. Will continue lasix 40 bid for now. If weight 198 or less change to lasix 40 daily. Hold lasix for SBP < 90. BP too low to titrate other meds.

## 2012-01-28 NOTE — Progress Notes (Signed)
Patient ID: Glenn Gonzalez, male   DOB: [birth year], 51 y.o.   MRN: [id number]  HPI:  Glenn Gonzalez is a 51 y/o male with long h/o CHF due to NICM (? related to chemo/XRT) with EF 25% s/p CRT-D previously followed by Dr. Melvern Banker.  PMHx notable for Hodgkins lymphona (treated age 31-23) Treated with high dose chemo - including adriamycin and XRT. Also has AF, VT, PUD and asthma. Developed NICM shortly thereafter. Initial EF 13%. Seen at [facility] transplant clinic much later and felt to be doing too well.   Last echo at [facility] recently and: EF < 25% mild MR. Myoview 4/07 EF 35% no ischemia or scar. Recently underwent generator changeout of his ICD with Dr. [name].  Has not been able to tolerate spironolactone or Inspra due to painful gynecomastia requiring surgery.   Previously in HF-ACTION with pVO2 in mid to low 20s. Recovered from foot fracture earlier this year. Saw Richardson Dopp in March for transient volume overload which responded to additional lasix. Also noted that HRs were high so low-dose Toprol added to Coreg. Unable to tolerate due to profound fatigue so stopped.  Stopped statin due to myalgias. 07/02/2011 ECHO 25%    Here for follow up.  Last visit Coreg increased to 25 mg BID and Lasix 60 mg bid. Evaluated by his PCP Friday due to dizziness. Also complained of difficulty finding words. PCP felt he was dehydrated. Lasix decreased to 40 mg BID.  He has changed to gluten free diet over the last month. He feels much better off gluten.  Denies SOB on exertion/PND/Orthopnea. Occasional dizziness. Continues to exercise in the am.  Weight at home 204.   ROS: All systems negative except as listed in HPI, PMH and Problem List.  Past Medical History  Diagnosis Date  . CHF (congestive heart failure)   . Hodgkin's lymphoma 1980    IIIB  . A-fib   . GERD (gastroesophageal reflux disease)   . Asthma   . Pacemaker 2011    [place] device/2011  . Hypothyroidism   . Depression   .  Pneumonia     recurrent pneumonia sedf.  rad tx for lymphoma    Current Outpatient Prescriptions  Medication Sig Dispense Refill  . acetaminophen (TYLENOL) 500 MG tablet Take 500 mg by mouth as needed. 1 tab at bed time for back pain       . carvedilol (COREG) 25 MG tablet Take 1 tablet (25 mg total) by mouth 2 (two) times daily with a meal.  180 tablet  3  . furosemide (LASIX) 40 MG tablet Take 60 mg by mouth 2 (two) times daily.       Marland Kitchen levothyroxine (SYNTHROID) 150 MCG tablet Take 1 tablet (150 mcg total) by mouth daily.  90 tablet  3  . quinapril (ACCUPRIL) 10 MG tablet Take 1 tablet (10 mg total) by mouth 2 (two) times daily.  180 tablet  3  . RABEprazole (ACIPHEX) 20 MG tablet Take 1 tablet (20 mg total) by mouth 2 (two) times daily.  180 tablet  3     PHYSICAL EXAM: Filed Vitals:   01/28/12 0909  BP: 86/58  Pulse: 72  Weight 214 (216) General:  Well appearing. No resp difficulty HEENT: normal Neck: supple. JVP flat. Carotids 2+ bilaterally; no bruits. No lymphadenopathy or thryomegaly appreciated. Cor: PMI normal. Regular rate & rhythm. No rubs, gallops or murmurs. Lungs: clear Abdomen: soft, nontender, nondistended. No hepatosplenomegaly. No bruits or masses. Good  bowel sounds. Extremities: no cyanosis, clubbing, rash, edema Neuro: alert & orientedx3, cranial nerves grossly intact. Moves all 4 extremities w/o difficulty. Affect pleasant.    ASSESSMENT & PLAN:

## 2012-01-28 NOTE — Patient Instructions (Addendum)
Continue taking lasix 40 twice a day.   If weight 198 pounds or less decrease lasix 40 mg daily.   Hold Lasix if your systolic blood pressure is less than 90.   Follow up in one month and obtain BMET  Follow up in 3-4 months.   Do the following things EVERYDAY: 1) Weigh yourself in the morning before breakfast. Write it down and keep it in a log. 2) Take your medicines as prescribed 3) Eat low salt foods-Limit salt (sodium) to 2000mg  per day.  4) Stay as active as you can everyday

## 2012-02-24 ENCOUNTER — Ambulatory Visit (INDEPENDENT_AMBULATORY_CARE_PROVIDER_SITE_OTHER): Payer: Managed Care, Other (non HMO) | Admitting: *Deleted

## 2012-02-24 DIAGNOSIS — I428 Other cardiomyopathies: Secondary | ICD-10-CM

## 2012-02-24 DIAGNOSIS — I5022 Chronic systolic (congestive) heart failure: Secondary | ICD-10-CM

## 2012-02-24 DIAGNOSIS — I472 Ventricular tachycardia: Secondary | ICD-10-CM

## 2012-02-24 LAB — BASIC METABOLIC PANEL
BUN: 19 mg/dL (ref 6–23)
CO2: 31 mEq/L (ref 19–32)
Calcium: 8.9 mg/dL (ref 8.4–10.5)
Creatinine, Ser: 1.3 mg/dL (ref 0.4–1.5)
Glucose, Bld: 81 mg/dL (ref 70–99)

## 2012-02-28 ENCOUNTER — Encounter: Payer: Self-pay | Admitting: Internal Medicine

## 2012-04-12 ENCOUNTER — Ambulatory Visit (INDEPENDENT_AMBULATORY_CARE_PROVIDER_SITE_OTHER): Payer: Managed Care, Other (non HMO) | Admitting: *Deleted

## 2012-04-12 DIAGNOSIS — I472 Ventricular tachycardia: Secondary | ICD-10-CM

## 2012-04-12 DIAGNOSIS — I428 Other cardiomyopathies: Secondary | ICD-10-CM

## 2012-04-12 NOTE — Progress Notes (Signed)
icd check in clinic  

## 2012-04-13 ENCOUNTER — Encounter: Payer: Self-pay | Admitting: Internal Medicine

## 2012-04-13 ENCOUNTER — Other Ambulatory Visit: Payer: Self-pay | Admitting: Internal Medicine

## 2012-04-13 LAB — ICD DEVICE OBSERVATION
AL AMPLITUDE: 2.2 mv
AL THRESHOLD: 0.6 V
BAMS-0002: 8 ms
RV LEAD AMPLITUDE: 22.7 mv
TZAT-0001FASTVT: 1
TZAT-0001SLOWVT: 2
TZAT-0002SLOWVT: NEGATIVE
TZAT-0002SLOWVT: NEGATIVE
TZAT-0012FASTVT: 200 ms
TZAT-0019FASTVT: 5 V
TZAT-0020FASTVT: 1 ms
TZON-0003FASTVT: 300 ms
TZON-0004FASTVT: 15
TZON-0005FASTVT: 1
TZST-0001FASTVT: 4
TZST-0001FASTVT: 8
TZST-0001SLOWVT: 5
TZST-0001SLOWVT: 7
TZST-0002SLOWVT: NEGATIVE
TZST-0002SLOWVT: NEGATIVE
TZST-0003FASTVT: 41 J
TZST-0003FASTVT: 41 J
TZST-0003FASTVT: 41 J

## 2012-04-13 MED ORDER — QUINAPRIL HCL 10 MG PO TABS: 10.0000 mg | ORAL_TABLET | Freq: Two times a day (BID) | ORAL | Status: DC

## 2012-04-23 ENCOUNTER — Other Ambulatory Visit: Payer: Self-pay

## 2012-04-23 DIAGNOSIS — Z95810 Presence of automatic (implantable) cardiac defibrillator: Secondary | ICD-10-CM

## 2012-04-23 DIAGNOSIS — I472 Ventricular tachycardia: Secondary | ICD-10-CM

## 2012-04-23 DIAGNOSIS — I428 Other cardiomyopathies: Secondary | ICD-10-CM

## 2012-04-23 DIAGNOSIS — I5022 Chronic systolic (congestive) heart failure: Secondary | ICD-10-CM

## 2012-04-23 MED ORDER — FUROSEMIDE 40 MG PO TABS: 40.0000 mg | ORAL_TABLET | Freq: Two times a day (BID) | ORAL | Status: DC

## 2012-05-18 ENCOUNTER — Encounter (HOSPITAL_COMMUNITY): Payer: Self-pay

## 2012-05-18 ENCOUNTER — Ambulatory Visit (HOSPITAL_COMMUNITY)
Admission: RE | Admit: 2012-05-18 | Discharge: 2012-05-18 | Disposition: A | Discharge status: Home / Self Care | Payer: Managed Care, Other (non HMO) | Source: Ambulatory Visit | Attending: Internal Medicine | Admitting: Internal Medicine

## 2012-05-18 VITALS — BP 90/60 | HR 73 | Ht 73.0 in | Wt 209.8 lb

## 2012-05-18 DIAGNOSIS — Z95810 Presence of automatic (implantable) cardiac defibrillator: Secondary | ICD-10-CM

## 2012-05-18 DIAGNOSIS — I428 Other cardiomyopathies: Secondary | ICD-10-CM

## 2012-05-18 DIAGNOSIS — I472 Ventricular tachycardia: Secondary | ICD-10-CM

## 2012-05-18 DIAGNOSIS — I5022 Chronic systolic (congestive) heart failure: Secondary | ICD-10-CM

## 2012-05-18 MED ORDER — QUINAPRIL HCL 10 MG PO TABS: 10.0000 mg | ORAL_TABLET | Freq: Two times a day (BID) | ORAL | Status: DC

## 2012-05-18 MED ORDER — FUROSEMIDE 40 MG PO TABS: 40.0000 mg | ORAL_TABLET | Freq: Two times a day (BID) | ORAL | Status: DC

## 2012-05-18 MED ORDER — CARVEDILOL 25 MG PO TABS: 25.0000 mg | ORAL_TABLET | Freq: Two times a day (BID) | ORAL | Status: DC

## 2012-05-18 NOTE — Progress Notes (Signed)
  HPI:  Glenn Gonzalez is a 51 y/o male with long h/o CHF due to NICM (? related to chemo/XRT) with EF 25% s/p CRT-D previously followed by Dr. Melvern Banker.  PMHx notable for Hodgkins lymphona (treated age [age]) Treated with high dose chemo - including adriamycin and XRT. Also has AF, VT, PUD and asthma. Developed NICM shortly thereafter. Initial EF 13%. Seen at [facility] transplant clinic much later and felt to be doing too well.   Last echo at [facility] recently and: EF < 25% mild MR. Myoview 4/07 EF 35% no ischemia or scar. Recently underwent generator changeout of his ICD with Dr. [name].  Has not been able to tolerate spironolactone or Inspra due to painful gynecomastia requiring surgery.  Previously in HF-ACTION with pVO2 in mid to low 20s.  Stopped statin due to myalgias.  07/02/2011 ECHO 25%   Glenn Gonzalez returns for follow up today.  He feels well.  His wight at home is stable 202-204.  He has noted that several times a week he is unable to tolerate his second dose of lasix due to dizziness.  He is currently taking lasix 40 mg BID.  He continues to exercise almost every morning.  He denies SOB, orthopnea, PND.  No chest pain.  He continues to feel better off gluten.   ROS: All systems negative except as listed in HPI, PMH and Problem List.  Past Medical History  Diagnosis Date  . CHF (congestive heart failure)   . Hodgkin's lymphoma 1980    IIIB  . A-fib   . GERD (gastroesophageal reflux disease)   . Asthma   . Pacemaker 2011    [place] device/2011  . Hypothyroidism   . Depression   . Pneumonia     recurrent pneumonia sedf.  rad tx for lymphoma    Current Outpatient Prescriptions  Medication Sig Dispense Refill  . acetaminophen (TYLENOL) 500 MG tablet Take 500 mg by mouth as needed. 1 tab at bed time for back pain       . carvedilol (COREG) 25 MG tablet Take 1 tablet (25 mg total) by mouth 2 (two) times daily with a meal.  180 tablet  3  . furosemide (LASIX) 40 MG tablet  Take 1 tablet (40 mg total) by mouth 2 (two) times daily.  180 tablet  1  . levothyroxine (SYNTHROID) 150 MCG tablet Take 1 tablet (150 mcg total) by mouth daily.  90 tablet  3  . quinapril (ACCUPRIL) 10 MG tablet Take 1 tablet (10 mg total) by mouth 2 (two) times daily.  180 tablet  2  . RABEprazole (ACIPHEX) 20 MG tablet Take 1 tablet (20 mg total) by mouth 2 (two) times daily.  180 tablet  3     PHYSICAL EXAM: Filed Vitals:   05/18/12 0908  BP: 90/60  Pulse: 73  Height: 6\' 1"  (1.854 m)  Weight: 209 lb 12.8 oz (95.165 kg)  SpO2: 96%    General:  Well appearing. No resp difficulty HEENT: normal Neck: supple. JVP flat. Carotids 2+ bilaterally; no bruits. No lymphadenopathy or thryomegaly appreciated. Cor: PMI normal. Regular rate & rhythm. No rubs, gallops or murmurs. Lungs: clear Abdomen: soft, nontender, nondistended. No hepatosplenomegaly. No bruits or masses. Good bowel sounds. Extremities: no cyanosis, clubbing, rash, edema Neuro: alert & orientedx3, cranial nerves grossly intact. Moves all 4 extremities w/o difficulty. Affect pleasant.    ASSESSMENT & PLAN:

## 2012-05-18 NOTE — Patient Instructions (Addendum)
Try lasix 40 mg in the morning and 20 mg in the afternoon.  Your physician has requested that you have an echocardiogram. Echocardiography is a painless test that uses sound waves to create images of your heart. It provides your doctor with information about the size and shape of your heart and how well your heart's chambers and valves are working. This procedure takes approximately one hour. There are no restrictions for this procedure.  In the next month.   Follow up with Dr. Haroldine Laws in 3 months.

## 2012-05-18 NOTE — Assessment & Plan Note (Signed)
NYHA II.  Volume status good today.  Have discussed diuretic regimen and will cut back to lasix 40/20 mg.  If his weight increases to 205 pounds then he will take 40/40 mg, he voices understanding.  Will not titrate meds with hypotension.  Over the last year he has required less diuretics will recheck echo to evaluate ejection fraction.

## 2012-06-08 ENCOUNTER — Ambulatory Visit (HOSPITAL_COMMUNITY)
Admission: RE | Admit: 2012-06-08 | Discharge: 2012-06-08 | Disposition: A | Discharge status: Home / Self Care | Payer: Managed Care, Other (non HMO) | Source: Ambulatory Visit | Attending: Physician Assistant | Admitting: Physician Assistant

## 2012-06-08 DIAGNOSIS — I509 Heart failure, unspecified: Secondary | ICD-10-CM | POA: Insufficient documentation

## 2012-06-08 DIAGNOSIS — I517 Cardiomegaly: Secondary | ICD-10-CM | POA: Insufficient documentation

## 2012-06-08 DIAGNOSIS — I428 Other cardiomyopathies: Secondary | ICD-10-CM | POA: Insufficient documentation

## 2012-06-08 DIAGNOSIS — I319 Disease of pericardium, unspecified: Secondary | ICD-10-CM

## 2012-06-08 DIAGNOSIS — I5022 Chronic systolic (congestive) heart failure: Secondary | ICD-10-CM

## 2012-06-08 DIAGNOSIS — R0609 Other forms of dyspnea: Secondary | ICD-10-CM | POA: Insufficient documentation

## 2012-06-08 DIAGNOSIS — R0989 Other specified symptoms and signs involving the circulatory and respiratory systems: Secondary | ICD-10-CM | POA: Insufficient documentation

## 2012-06-08 NOTE — Progress Notes (Signed)
  Echocardiogram 2D Echocardiogram has been performed.  Glenn Gonzalez FRANCES 06/08/2012, 10:57 AM

## 2012-06-11 ENCOUNTER — Other Ambulatory Visit: Payer: Self-pay

## 2012-07-05 ENCOUNTER — Other Ambulatory Visit: Payer: Self-pay

## 2012-07-14 ENCOUNTER — Ambulatory Visit (INDEPENDENT_AMBULATORY_CARE_PROVIDER_SITE_OTHER): Payer: Managed Care, Other (non HMO) | Admitting: *Deleted

## 2012-07-14 ENCOUNTER — Encounter: Payer: Self-pay | Admitting: Internal Medicine

## 2012-07-14 DIAGNOSIS — I428 Other cardiomyopathies: Secondary | ICD-10-CM

## 2012-07-14 LAB — ICD DEVICE OBSERVATION
AL IMPEDENCE ICD: 435 Ohm
AL THRESHOLD: 0.5 V
BAMS-0002: 8 ms
BAMS-0003: 70 {beats}/min
LV LEAD IMPEDENCE ICD: 575 Ohm
LV LEAD THRESHOLD: 0.6 V
RV LEAD THRESHOLD: 0.6 V
TZAT-0001FASTVT: 2
TZAT-0002SLOWVT: NEGATIVE
TZAT-0004FASTVT: 8
TZAT-0012FASTVT: 200 ms
TZAT-0013FASTVT: 1
TZAT-0018FASTVT: NEGATIVE
TZAT-0018SLOWVT: NEGATIVE
TZON-0003SLOWVT: 429 ms
TZON-0004SLOWVT: 15
TZON-0005FASTVT: 1
TZON-0005SLOWVT: 1
TZST-0001FASTVT: 3
TZST-0001FASTVT: 5
TZST-0001FASTVT: 7
TZST-0001SLOWVT: 4
TZST-0001SLOWVT: 7
TZST-0002SLOWVT: NEGATIVE
TZST-0002SLOWVT: NEGATIVE
TZST-0002SLOWVT: NEGATIVE
TZST-0003FASTVT: 41 J
TZST-0003FASTVT: 41 J
TZST-0003FASTVT: 41 J

## 2012-07-14 NOTE — Progress Notes (Signed)
icd check in clinic  

## 2012-08-16 ENCOUNTER — Other Ambulatory Visit: Payer: Self-pay

## 2012-09-03 ENCOUNTER — Encounter: Payer: Self-pay | Admitting: Gastroenterology

## 2012-10-19 ENCOUNTER — Encounter: Payer: Self-pay | Admitting: Internal Medicine

## 2012-10-19 ENCOUNTER — Ambulatory Visit (INDEPENDENT_AMBULATORY_CARE_PROVIDER_SITE_OTHER): Payer: Managed Care, Other (non HMO) | Admitting: Internal Medicine

## 2012-10-19 VITALS — BP 98/67 | HR 74 | Resp 16 | Ht 73.0 in | Wt 210.0 lb

## 2012-10-19 DIAGNOSIS — I5022 Chronic systolic (congestive) heart failure: Secondary | ICD-10-CM

## 2012-10-19 DIAGNOSIS — I428 Other cardiomyopathies: Secondary | ICD-10-CM

## 2012-10-19 DIAGNOSIS — R0602 Shortness of breath: Secondary | ICD-10-CM

## 2012-10-19 DIAGNOSIS — Z95810 Presence of automatic (implantable) cardiac defibrillator: Secondary | ICD-10-CM

## 2012-10-19 LAB — ICD DEVICE OBSERVATION
AL AMPLITUDE: 2.5 mv
ATRIAL PACING ICD: 1 pct
BAMS-0002: 8 ms
BAMS-0003: 70 {beats}/min
CHARGE TIME: 9.3 s
HV IMPEDENCE: 53 Ohm
LV LEAD IMPEDENCE ICD: 586 Ohm
LV LEAD THRESHOLD: 0.7 V
RV LEAD IMPEDENCE ICD: 510 Ohm
RV LEAD THRESHOLD: 0.8 V
TZAT-0001SLOWVT: 1
TZAT-0004FASTVT: 8
TZAT-0012FASTVT: 200 ms
TZAT-0013FASTVT: 1
TZAT-0018FASTVT: NEGATIVE
TZAT-0018FASTVT: NEGATIVE
TZAT-0018SLOWVT: NEGATIVE
TZON-0004SLOWVT: 15
TZON-0005SLOWVT: 1
TZST-0001FASTVT: 3
TZST-0001FASTVT: 5
TZST-0001FASTVT: 8
TZST-0001SLOWVT: 4
TZST-0002SLOWVT: NEGATIVE
TZST-0002SLOWVT: NEGATIVE
TZST-0002SLOWVT: NEGATIVE
TZST-0003FASTVT: 41 J
TZST-0003FASTVT: 41 J
TZST-0003FASTVT: 41 J

## 2012-10-19 LAB — BASIC METABOLIC PANEL
CO2: 30 mEq/L (ref 19–32)
Chloride: 99 mEq/L (ref 96–112)
Sodium: 136 mEq/L (ref 135–145)

## 2012-10-19 NOTE — Progress Notes (Signed)
Patient Care Team: Haywood Pao, MD as PCP - General (Internal Medicine)   HPI  Glenn Gonzalez is a 51 y.o. 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 He is s/p CRT-D  He is followed by the heart failure clinic. He has been intolerant of aldosterone antagonists secondary to gynecomastia.  He has had recent episodes with nocturnal dyspnea of relatively brief duration. They are relieved with time and without needing to sit up. This has been concurrent with a cough and a cold pill form of which has been nonproductive   Past Medical History  Diagnosis Date  . CHF (congestive heart failure)   . Hodgkin's lymphoma 1980    IIIB  . A-fib   . GERD (gastroesophageal reflux disease)   . Asthma   . Pacemaker 2011    [place] device/2011  . Hypothyroidism   . Depression   . Pneumonia     recurrent pneumonia sedf.  rad tx for lymphoma    Past Surgical History  Procedure Date  . Neck biopses     x5  . Cholecystectomy   . Exploratory laparotomy   . Permanent pacemaker 2011    [employer] COGNIS device  . Gynemastia   . Portacath placement   . Tonsillectomy     Current Outpatient Prescriptions  Medication Sig Dispense Refill  . acetaminophen (TYLENOL) 500 MG tablet Take 500 mg by mouth as needed. 1 tab at bed time for back pain       . carvedilol (COREG) 25 MG tablet Take 1 tablet (25 mg total) by mouth 2 (two) times daily with a meal.  180 tablet  3  . furosemide (LASIX) 40 MG tablet Take 40 mg by mouth daily. Also 20mg  at bedtime as needed      . levothyroxine (SYNTHROID, LEVOTHROID) 150 MCG tablet Take 150 mcg by mouth daily.      . quinapril (ACCUPRIL) 10 MG tablet Take 1 tablet (10 mg total) by mouth 2 (two) times daily.  180 tablet  3  . RABEprazole (ACIPHEX) 20 MG tablet Take 1  tablet (20 mg total) by mouth 2 (two) times daily.  180 tablet  3  . [DISCONTINUED] levothyroxine (SYNTHROID) 150 MCG tablet Take 1 tablet (150 mcg total) by mouth daily.  90 tablet  3    Allergies  Allergen Reactions  . Milk-Related Compounds   . Penicillins     Review of Systems negative except from HPI and PMH  Physical Exam BP 98/67  Pulse 74  Resp 16  Ht 6\' 1"  (1.854 m)  Wt 210 lb (95.255 kg)  BMI 27.71 kg/m2 Well developed and well nourished in no acute distress HENT normal E scleral and icterus clear Neck Supple JVP flat; carotids brisk and full Clear to ausculation  Regular rate and rhythm, no murmurs gallops or rub Soft with active bowel sounds No clubbing cyanosis none Edema Alert and oriented, grossly normal motor and sensory function Skin Warm and Dry    Assessment and  Plan

## 2012-10-19 NOTE — Assessment & Plan Note (Signed)
Continue current medications. 

## 2012-10-19 NOTE — Assessment & Plan Note (Signed)
Nocturnal dyspnea does not sound to be cardiogenic in that it is relieved without having to sit up. There are episodes on the device over the last few weeks that it involved PAT and nonsustained ventricular tachycardia related to each other. They are about 10-15 seconds or so he says that about the duration of the spells. We will reprogram his PVR and check magnesium and potassium to see we can understand why it is that he is having more ventricular tachycardia

## 2012-10-19 NOTE — Assessment & Plan Note (Signed)
As above.

## 2012-10-19 NOTE — Patient Instructions (Signed)
LABS TODAY:  BMET and MAG

## 2012-10-19 NOTE — Assessment & Plan Note (Signed)
The patient's device was interrogated and the information was fully reviewed.  The device was reprogrammed as above to avoid PMT

## 2012-10-20 ENCOUNTER — Telehealth: Payer: Self-pay | Admitting: Internal Medicine

## 2012-10-20 NOTE — Telephone Encounter (Signed)
Notified. 

## 2012-10-20 NOTE — Telephone Encounter (Signed)
plz return call to pt [phone] regarding labwork results

## 2012-11-17 ENCOUNTER — Other Ambulatory Visit: Payer: Self-pay

## 2013-01-15 ENCOUNTER — Other Ambulatory Visit: Payer: Self-pay

## 2013-01-25 ENCOUNTER — Ambulatory Visit (HOSPITAL_COMMUNITY)
Admission: RE | Admit: 2013-01-25 | Discharge: 2013-01-25 | Disposition: A | Discharge status: Home / Self Care | Payer: Managed Care, Other (non HMO) | Source: Ambulatory Visit | Attending: Internal Medicine | Admitting: Internal Medicine

## 2013-01-25 ENCOUNTER — Encounter (HOSPITAL_COMMUNITY): Payer: Self-pay

## 2013-01-25 VITALS — BP 90/68 | HR 72 | Wt 218.8 lb

## 2013-01-25 DIAGNOSIS — I428 Other cardiomyopathies: Secondary | ICD-10-CM

## 2013-01-25 DIAGNOSIS — I5022 Chronic systolic (congestive) heart failure: Secondary | ICD-10-CM | POA: Insufficient documentation

## 2013-01-25 LAB — BASIC METABOLIC PANEL
CO2: 28 mEq/L (ref 19–32)
Glucose, Bld: 111 mg/dL — ABNORMAL HIGH (ref 70–99)
Potassium: 4.6 mEq/L (ref 3.5–5.1)
Sodium: 137 mEq/L (ref 135–145)

## 2013-01-25 MED ORDER — FUROSEMIDE 40 MG PO TABS: ORAL_TABLET | ORAL | Status: DC

## 2013-01-25 MED ORDER — FUROSEMIDE 40 MG PO TABS: 60.0000 mg | ORAL_TABLET | Freq: Every day | ORAL | Status: DC

## 2013-01-25 NOTE — Progress Notes (Signed)
Patient ID: Glenn Gonzalez, male   DOB: Nov 01, 1961, 52 y.o.   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 related to chemo/XRT) with EF 25% s/p CRT-D previously followed by Dr. Melvern Banker.  PMHx notable for Hodgkins lymphona (treated age [age]) Treated with high dose chemo - including adriamycin and XRT. Also has AF, VT, PUD and asthma. Developed NICM shortly thereafter. Initial EF 13%. Seen at [facility] transplant clinic much later and felt to be doing too well. Cath with normal coronaries.  Has not been able to tolerate spironolactone or Inspra due to painful gynecomastia requiring surgery.  Previously in HF-ACTION with pVO2 in mid to low 20s.  Stopped statin due to myalgias.  07/02/2011 ECHO 25% 05/2012 ECHO EF 25%  10/2012 Dr [name] reprogrammed BiV device due to PMT NSVT  CPX 5/12:  pVO2 17.3 (59% predicted) VeVCo2 slope 38.4 RER 1.14  Glenn Gonzalez returns for acute work in due to increased dyspnea. He noticed increased dyspnea last week. Weight at home trending from 203 to 210. He increased lasix in a stepwise fashion to 60 mg bid and he increased quinapril to 15 mg twice a day. Weight now down to 207 Now complains of dizziness. Increased dyspnea with exertion. Complains PND.  Complaint with meds.   Still trying to exercise with walking and elliptical but says his exercise capacity is not what it used to be.  ICD interrogated in clinic continues with brief episodes of NSVT and PMT. No therapies required. HR variability very low. Peak HR 80.    ROS: All systems negative except as listed in HPI, PMH and Problem List.  Past Medical History  Diagnosis Date  . CHF (congestive heart failure)   . Hodgkin's lymphoma 1980    IIIB  . A-fib   . GERD (gastroesophageal reflux disease)   . Asthma   . Pacemaker 2011    [place] device/2011  . Hypothyroidism   . Depression   . Pneumonia     recurrent pneumonia sedf.  rad tx for lymphoma     Current Outpatient Prescriptions  Medication Sig Dispense Refill  . acetaminophen (TYLENOL) 500 MG tablet Take 500 mg by mouth as needed. 1 tab at bed time for back pain       . carvedilol (COREG) 25 MG tablet Take 1 tablet (25 mg total) by mouth 2 (two) times daily with a meal.  180 tablet  3  . furosemide (LASIX) 40 MG tablet Take 40 mg by mouth daily. Take one and one half tab bid      . levothyroxine (SYNTHROID, LEVOTHROID) 150 MCG tablet Take 150 mcg by mouth daily.      . quinapril (ACCUPRIL) 10 MG tablet Take 15 mg by mouth 2 (two) times daily. Taking one and one half tab bid      . RABEprazole (ACIPHEX) 20 MG tablet Take 1 tablet (20 mg total) by mouth 2 (two) times daily.  180 tablet  3   No current facility-administered medications for this encounter.     PHYSICAL EXAM: Filed Vitals:   01/25/13 0926  BP: 90/68  Pulse: 72  Weight: 218 lb 12.8 oz (99.247 kg)  SpO2: 96%    General:  Well appearing. No resp difficulty HEENT: normal Neck: supple. JVP 7. Carotids 2+ bilaterally; no bruits. No lymphadenopathy or thryomegaly appreciated. Cor: PMI normal. Regular rate & rhythm. No rubs, gallops or murmurs. Lungs: clear  Abdomen: soft, nontender, mildly distended. No hepatosplenomegaly. No bruits or masses. Good bowel sounds. Extremities: no cyanosis, clubbing, rash, edema Neuro: alert & orientedx3, cranial nerves grossly intact. Moves all 4 extremities w/o difficulty. Affect pleasant.    ASSESSMENT & PLAN:

## 2013-01-25 NOTE — Patient Instructions (Addendum)
Please schedule CPX test  Continue Lasix 60 mg twice a day for 2 days then cut back to 60 mg daily  Follow up in 2-3  Weeks after CPX  Do the following things EVERYDAY: 1) Weigh yourself in the morning before breakfast. Write it down and keep it in a log. 2) Take your medicines as prescribed 3) Eat low salt foods-Limit salt (sodium) to 2000 mg per day.  4) Stay as active as you can everyday 5) Limit all fluids for the day to less than 2 liters

## 2013-01-30 NOTE — Assessment & Plan Note (Signed)
Patient seen and examined with Darrick Grinder, NP. We discussed all aspects of the encounter. I agree with the assessment as stated above. My thoughts below.  Extensive discussion with him about his situation. Overall, I feel he is slowing getting worse. Now NYHA III-IIIB and struggling with volume overload. Will keep lasix at 60 bid for another 2 days and then decrease to 60 daily. Reinforced need for daily weights and reviewed use of sliding scale diuretics. Will plan CPX in order to objectively evaluate functional capacity and determine if he is candidate for advanced therapies in the near future.

## 2013-01-31 ENCOUNTER — Ambulatory Visit (HOSPITAL_COMMUNITY): Payer: Managed Care, Other (non HMO) | Attending: Internal Medicine

## 2013-01-31 DIAGNOSIS — I5022 Chronic systolic (congestive) heart failure: Secondary | ICD-10-CM | POA: Insufficient documentation

## 2013-02-17 ENCOUNTER — Encounter (HOSPITAL_COMMUNITY): Payer: Managed Care, Other (non HMO)

## 2013-02-28 ENCOUNTER — Encounter (HOSPITAL_COMMUNITY): Payer: Self-pay

## 2013-02-28 ENCOUNTER — Telehealth (HOSPITAL_COMMUNITY): Payer: Self-pay | Admitting: Adult Health

## 2013-02-28 ENCOUNTER — Ambulatory Visit (HOSPITAL_COMMUNITY)
Admission: RE | Admit: 2013-02-28 | Discharge: 2013-02-28 | Disposition: A | Discharge status: Home / Self Care | Payer: Managed Care, Other (non HMO) | Source: Ambulatory Visit | Attending: Internal Medicine | Admitting: Internal Medicine

## 2013-02-28 VITALS — BP 100/62 | HR 78 | Wt 214.0 lb

## 2013-02-28 DIAGNOSIS — I509 Heart failure, unspecified: Secondary | ICD-10-CM | POA: Insufficient documentation

## 2013-02-28 DIAGNOSIS — I5022 Chronic systolic (congestive) heart failure: Secondary | ICD-10-CM | POA: Insufficient documentation

## 2013-02-28 LAB — BASIC METABOLIC PANEL
CO2: 27 mEq/L (ref 19–32)
Glucose, Bld: 107 mg/dL — ABNORMAL HIGH (ref 70–99)
Potassium: 4.5 mEq/L (ref 3.5–5.1)
Sodium: 140 mEq/L (ref 135–145)

## 2013-02-28 NOTE — Patient Instructions (Addendum)
Follow up in 3 months   Do the following things EVERYDAY: 1) Weigh yourself in the morning before breakfast. Write it down and keep it in a log. 2) Take your medicines as prescribed 3) Eat low salt foods-Limit salt (sodium) to 2000 mg per day.  4) Stay as active as you can everyday 5) Limit all fluids for the day to less than 2 liters  

## 2013-02-28 NOTE — Assessment & Plan Note (Addendum)
NYHA II. Volume status improved. Continue current diuretic regimen.  Reviewed and discussed CPX results. Agreed with ongoing exercise 40 minutes 5 days a week. Check BMET today.  Follow up in 3 months.  Patient seen and examined with Darrick Grinder, NP. We discussed all aspects of the encounter. I agree with the assessment and plan as stated above. He is improved symptomatically but still with good days and bad days NYHA II-III. Reviewed CPX in detail. Will continue current regimen. Reinforced need for daily weights and reviewed use of sliding scale diuretics. Will see back in 2-3 months.

## 2013-02-28 NOTE — Progress Notes (Signed)
Patient ID: Glenn Gonzalez, male   DOB: September 04, 1961, 52 y.o.   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 related to chemo/XRT) with EF 25% s/p CRT-D previously followed by Dr. Melvern Banker.  PMHx notable for Hodgkins lymphona (treated age [age]) Treated with high dose chemo - including adriamycin and XRT. Also has AF, VT, PUD and asthma. Developed NICM shortly thereafter. Initial EF 13%. Seen at [facility] transplant clinic much later and felt to be doing too well. Cath with normal coronaries.  Has not been able to tolerate spironolactone or Inspra due to painful gynecomastia requiring surgery.  Previously in HF-ACTION with pVO2 in mid to low 20s.  Stopped statin due to myalgias.  07/02/2011 ECHO 25% 05/2012 ECHO EF 25%  10/2012 Dr [name] reprogrammed BiV device due to PMT NSVT  CPX 5/12:  pVO2 17.3 (59% predicted) VeVCo2 slope 38.4 RER 1.14  CPX 02/28/13  PVO2 19 (60% predicted) VEVCO2 slope 31. RER1.16  He returns for follow up. Last visit lasix increase to 60 mg for 2 days and after cut back to 60 mg daily. Overall he feels better. Mild dyspnea with steps. Denies orthopnea/PND. He continues to exercise with walking and elliptical in the am.  Compliant with medications. He is limiting fluid intake to < 2 liters per day.   ROS: All systems negative except as listed in HPI, PMH and Problem List.  Past Medical History  Diagnosis Date  . CHF (congestive heart failure)   . Hodgkin's lymphoma 1980    IIIB  . A-fib   . GERD (gastroesophageal reflux disease)   . Asthma   . Pacemaker 2011    [place] device/2011  . Hypothyroidism   . Depression   . Pneumonia     recurrent pneumonia sedf.  rad tx for lymphoma    Current Outpatient Prescriptions  Medication Sig Dispense Refill  . acetaminophen (TYLENOL) 500 MG tablet Take 500 mg by mouth as needed. 1 tab at bed time for back pain       . carvedilol (COREG) 25 MG tablet Take 1 tablet (25  mg total) by mouth 2 (two) times daily with a meal.  180 tablet  3  . furosemide (LASIX) 40 MG tablet Take 60 mg daily.  90 tablet  3  . levothyroxine (SYNTHROID, LEVOTHROID) 150 MCG tablet Take 150 mcg by mouth daily.      Marland Kitchen loratadine (CLARITIN) 10 MG tablet Take 10 mg by mouth daily.      . quinapril (ACCUPRIL) 10 MG tablet Take 15 mg by mouth 2 (two) times daily. Taking one and one half tab bid      . RABEprazole (ACIPHEX) 20 MG tablet Take 1 tablet (20 mg total) by mouth 2 (two) times daily.  180 tablet  3   No current facility-administered medications for this encounter.     PHYSICAL EXAM: Filed Vitals:   02/28/13 0939  BP: 100/62  Pulse: 78  Weight: 214 lb (97.07 kg)  SpO2: 99%    General:  Well appearing. No resp difficulty HEENT: normal Neck: supple. JVP 5-6. Carotids 2+ bilaterally; no bruits. No lymphadenopathy or thryomegaly appreciated. Cor: PMI normal. Regular rate & rhythm. No rubs, gallops or murmurs. Lungs: clear Abdomen: soft, nontender, mildly distended. No hepatosplenomegaly. No bruits or masses. Good bowel sounds. Extremities: no cyanosis, clubbing, rash, edema Neuro: alert & orientedx3, cranial nerves grossly intact. Moves all 4 extremities w/o  difficulty. Affect pleasant.    ASSESSMENT & PLAN:

## 2013-02-28 NOTE — Telephone Encounter (Signed)
Left message .   Lab work stable. No changes needed.   Glenn Gonzalez 4:51 PM

## 2013-05-06 ENCOUNTER — Encounter (HOSPITAL_COMMUNITY): Payer: Self-pay

## 2013-05-06 ENCOUNTER — Ambulatory Visit (HOSPITAL_COMMUNITY)
Admission: RE | Admit: 2013-05-06 | Discharge: 2013-05-06 | Disposition: A | Discharge status: Home / Self Care | Payer: Managed Care, Other (non HMO) | Source: Ambulatory Visit | Attending: Internal Medicine | Admitting: Internal Medicine

## 2013-05-06 VITALS — BP 80/47 | HR 81 | Wt 214.8 lb

## 2013-05-06 DIAGNOSIS — I472 Ventricular tachycardia: Secondary | ICD-10-CM

## 2013-05-06 DIAGNOSIS — I5022 Chronic systolic (congestive) heart failure: Secondary | ICD-10-CM

## 2013-05-06 DIAGNOSIS — I509 Heart failure, unspecified: Secondary | ICD-10-CM | POA: Insufficient documentation

## 2013-05-06 DIAGNOSIS — I4891 Unspecified atrial fibrillation: Secondary | ICD-10-CM | POA: Insufficient documentation

## 2013-05-06 DIAGNOSIS — I428 Other cardiomyopathies: Secondary | ICD-10-CM

## 2013-05-06 LAB — BASIC METABOLIC PANEL
BUN: 33 mg/dL — ABNORMAL HIGH (ref 6–23)
Chloride: 100 mEq/L (ref 96–112)
GFR calc Af Amer: 81 mL/min — ABNORMAL LOW (ref >90)
Glucose, Bld: 96 mg/dL (ref 70–99)
Potassium: 4.2 mEq/L (ref 3.5–5.1)

## 2013-05-06 MED ORDER — CARVEDILOL 25 MG PO TABS: 25.0000 mg | ORAL_TABLET | Freq: Two times a day (BID) | ORAL | Status: DC

## 2013-05-06 NOTE — Patient Instructions (Addendum)
Follow up in 3-4 months  Do the following things EVERYDAY: 1) Weigh yourself in the morning before breakfast. Write it down and keep it in a log. 2) Take your medicines as prescribed 3) Eat low salt foods-Limit salt (sodium) to 2000 mg per day.  4) Stay as active as you can everyday 5) Limit all fluids for the day to less than 2 liters 

## 2013-05-06 NOTE — Progress Notes (Signed)
Patient ID: Glenn Gonzalez, male   DOB: [birth year], 52 y.o.   MRN: [id number]  HPI: Glenn Gonzalez is a 52 y/o male with long h/o CHF due to NICM (? related to chemo/XRT) with EF 25% s/p CRT-D previously followed by Dr. Melvern Banker.  PMHx notable for Hodgkins lymphona (treated age 70-23) Treated with high dose chemo - including adriamycin and XRT. Also has AF, VT, PUD and asthma. Developed NICM shortly thereafter. Initial EF 13%. Previously een at [facility] transplant clinic and felt to be doing too well to require Tx. Cath with normal coronaries. Has not been able to tolerate spironolactone or Inspra due to painful gynecomastia requiring surgery.  Previously in HF-ACTION with pVO2 in mid to low 20s.  Stopped statin due to myalgias.  07/02/2011 ECHO 25% 05/2012 ECHO EF 25%  10/2012 Dr [name] reprogrammed BiV device due to PMT NSVT  CPX 5/12:  pVO2 17.3 (59% predicted) VeVCo2 slope 38.4 RER 1.14  CPX 02/28/13  PVO2 19 (60% predicted) VEVCO2 slope 31. RER1.16  He returns for follow up. Denies SOB/PND/Orthopnea. Exercising 3-4 days a week but can get wiped out afterward. Compliant with medications. Adjusting diuretics as needed. He is limiting fluid intake to < 2 liters per day.   ROS: All systems negative except as listed in HPI, PMH and Problem List.  Past Medical History  Diagnosis Date  . CHF (congestive heart failure)   . Hodgkin's lymphoma 1980    IIIB  . A-fib   . GERD (gastroesophageal reflux disease)   . Asthma   . Pacemaker 2011    [place] device/2011  . Hypothyroidism   . Depression   . Pneumonia     recurrent pneumonia sedf.  rad tx for lymphoma    Current Outpatient Prescriptions  Medication Sig Dispense Refill  . acetaminophen (TYLENOL) 500 MG tablet Take 500 mg by mouth as needed. 1 tab at bed time for back pain       . carvedilol (COREG) 25 MG tablet Take 1 tablet (25 mg total) by mouth 2 (two) times daily with a meal.  180 tablet  3  . furosemide (LASIX) 40  MG tablet Take 60 mg daily.  90 tablet  3  . levothyroxine (SYNTHROID, LEVOTHROID) 150 MCG tablet Take 150 mcg by mouth daily.      Marland Kitchen loratadine (CLARITIN) 10 MG tablet Take 10 mg by mouth daily.      . quinapril (ACCUPRIL) 10 MG tablet Take 15 mg by mouth 2 (two) times daily. Taking one and one half tab bid      . RABEprazole (ACIPHEX) 20 MG tablet Take 1 tablet (20 mg total) by mouth 2 (two) times daily.  180 tablet  3   No current facility-administered medications for this encounter.     PHYSICAL EXAM: Filed Vitals:   05/06/13 0912  BP: 80/47  Pulse: 81  Weight: 214 lb 12.8 oz (97.433 kg)  SpO2: 94%    General:  Well appearing. No resp difficulty HEENT: normal Neck: supple. JVP 5-6. Carotids 2+ bilaterally; no bruits. No lymphadenopathy or thryomegaly appreciated. Cor: PMI normal. Regular rate & rhythm. No rubs, gallops or murmurs. Lungs: clear Abdomen: soft, nontender, mildly distended. No hepatosplenomegaly. No bruits or masses. Good bowel sounds. Extremities: no cyanosis, clubbing, rash, edema Neuro: alert & orientedx3, cranial nerves grossly intact. Moves all 4 extremities w/o difficulty. Affect pleasant.    ASSESSMENT & PLAN:

## 2013-05-06 NOTE — Assessment & Plan Note (Addendum)
Volume status stable. Continue current regimen. Follow up in 3-4 months.   Patient seen and examined with Darrick Grinder, NP. We discussed all aspects of the encounter. I agree with the assessment and plan as stated above. NYHA II-III. Volume status much improved. Reinforced need for daily weights and reviewed use of sliding scale diuretics. BP remains soft but tolerating meds. Will continue current meds. Suspect he will need advanced therapies at some point.

## 2013-05-31 ENCOUNTER — Ambulatory Visit (INDEPENDENT_AMBULATORY_CARE_PROVIDER_SITE_OTHER): Payer: Managed Care, Other (non HMO) | Admitting: Internal Medicine

## 2013-05-31 ENCOUNTER — Encounter: Payer: Self-pay | Admitting: Internal Medicine

## 2013-05-31 VITALS — BP 89/68 | HR 73 | Ht 73.0 in | Wt 213.0 lb

## 2013-05-31 DIAGNOSIS — I472 Ventricular tachycardia: Secondary | ICD-10-CM

## 2013-05-31 DIAGNOSIS — I4589 Other specified conduction disorders: Secondary | ICD-10-CM

## 2013-05-31 DIAGNOSIS — I5022 Chronic systolic (congestive) heart failure: Secondary | ICD-10-CM

## 2013-05-31 DIAGNOSIS — Z95810 Presence of automatic (implantable) cardiac defibrillator: Secondary | ICD-10-CM

## 2013-05-31 LAB — ICD DEVICE OBSERVATION
AL AMPLITUDE: 3.4 mv
AL THRESHOLD: 0.7 V
ATRIAL PACING ICD: 1 pct
BAMS-0002: 8 ms
BAMS-0003: 70 {beats}/min
RV LEAD IMPEDENCE ICD: 546 Ohm
TZAT-0001SLOWVT: 1
TZAT-0001SLOWVT: 2
TZAT-0002SLOWVT: NEGATIVE
TZAT-0002SLOWVT: NEGATIVE
TZAT-0012FASTVT: 200 ms
TZAT-0013FASTVT: 1
TZAT-0018FASTVT: NEGATIVE
TZAT-0019FASTVT: 5 V
TZAT-0020FASTVT: 1 ms
TZON-0003FASTVT: 300 ms
TZON-0004FASTVT: 15
TZON-0004SLOWVT: 15
TZST-0001FASTVT: 4
TZST-0001FASTVT: 8
TZST-0001SLOWVT: 3
TZST-0001SLOWVT: 5
TZST-0001SLOWVT: 7
TZST-0002SLOWVT: NEGATIVE
TZST-0002SLOWVT: NEGATIVE
TZST-0002SLOWVT: NEGATIVE
TZST-0003FASTVT: 41 J
TZST-0003FASTVT: 41 J
TZST-0003FASTVT: 41 J
TZST-0003FASTVT: 41 J
VENTRICULAR PACING ICD: 100 pct

## 2013-05-31 NOTE — Assessment & Plan Note (Signed)
The patient's device was interrogated and the information was fully reviewed.  The device was reprogrammed to activate rate response. He'll let us know how he feels

## 2013-05-31 NOTE — Progress Notes (Signed)
Patient Care Team: Haywood Pao, MD as PCP - General (Internal Medicine)   HPI  Glenn Gonzalez is a 52 y.o. 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 He is s/p CRT-D  He is followed by the heart failure clinic.  He was referred back because of cardiopulmonary stress testing demonstrating significant chronotropic incompetence with a peak heart rate of about 100 beats per minute representing 60% predicted max.  He has had some issues with renal insufficiency; his creatinine is about 1.8. He recently had an episode of abdominal swelling which he treated with brief up titration of his diuretics with relief   Past Medical History  Diagnosis Date  . CHF (congestive heart failure)   . Hodgkin's lymphoma 1980    IIIB  . A-fib   . GERD (gastroesophageal reflux disease)   . Asthma   . Pacemaker 2011    [place] device/2011  . Hypothyroidism   . Depression   . Pneumonia     recurrent pneumonia sedf.  rad tx for lymphoma    Past Surgical History  Procedure Laterality Date  . Neck biopses      x5  . Cholecystectomy    . Exploratory laparotomy    . Permanent pacemaker  2011    [employer] COGNIS device  . Gynemastia    . Portacath placement    . Tonsillectomy      Current Outpatient Prescriptions  Medication Sig Dispense Refill  . acetaminophen (TYLENOL) 500 MG tablet Take 500 mg by mouth as needed. 1 tab at bed time for back pain       . carvedilol (COREG) 25 MG tablet Take 1 tablet (25 mg total) by mouth 2 (two) times daily with a meal.  60 tablet  12  . furosemide (LASIX) 40 MG tablet Take 60 mg daily.  90 tablet  3  . levothyroxine (SYNTHROID, LEVOTHROID) 150 MCG tablet Take 150 mcg by mouth daily.      Marland Kitchen loratadine (CLARITIN) 10 MG tablet Take 10 mg by mouth daily.      .  quinapril (ACCUPRIL) 10 MG tablet Take 15 mg by mouth 2 (two) times daily. Taking one and one half tab bid      . RABEprazole (ACIPHEX) 20 MG tablet Take 1 tablet (20 mg total) by mouth 2 (two) times daily.  180 tablet  3   No current facility-administered medications for this visit.    Allergies  Allergen Reactions  . Milk-Related Compounds   . Penicillins     Review of Systems negative except from HPI and PMH  Physical Exam BP 89/68  Pulse 73  Ht 6\' 1"  (1.854 m)  Wt 213 lb (96.616 kg)  BMI 28.11 kg/m2 Well developed and well nourished in no acute distress  HENT normal Neck supple with JVP-flat Clear Regular rate and rhythm, no murmurs or gallops Abd-soft with active BS No Clubbing cyanosis edema Skin-warm and dry A & Oriented  Grossly normal sensory and motor function  His ECG demonstratessinus rhythm with a synchronous pacing with a biventricular pacing pattern  Assessment and  Plan

## 2013-05-31 NOTE — Assessment & Plan Note (Signed)
No intercurrent Ventricular tachycardia  

## 2013-05-31 NOTE — Assessment & Plan Note (Signed)
euvolemic 

## 2013-05-31 NOTE — Assessment & Plan Note (Signed)
Patient is markedly chronotropic incompetent. His major issue though is whether if we increase his heart rate will adversely affect the outcome. I think based on Heart Failure Action the benefits of exercise symptom lines are not associated with adverse outcome. We'll activate rate response.

## 2013-05-31 NOTE — Assessment & Plan Note (Signed)
The patient's device was interrogated and the information was fully reviewed.  The device was reprogrammed to activate rate response

## 2013-05-31 NOTE — Patient Instructions (Addendum)
Your physician recommends that you schedule a follow-up appointment in: 3 months with the device clinic.  Your physician wants you to follow-up in: 1 year with Dr. [name]. You will receive a reminder letter in the mail two months in advance. If you don't receive a letter, please call our office to schedule the follow-up appointment.  Your physician recommends that you continue on your current medications as directed. Please refer to the Current Medication list given to you today.

## 2013-07-15 ENCOUNTER — Other Ambulatory Visit: Payer: Self-pay | Admitting: *Deleted

## 2013-07-15 MED ORDER — FUROSEMIDE 40 MG PO TABS: ORAL_TABLET | ORAL | Status: DC

## 2013-07-25 ENCOUNTER — Other Ambulatory Visit (HOSPITAL_COMMUNITY): Payer: Self-pay | Admitting: *Deleted

## 2013-07-25 DIAGNOSIS — I5022 Chronic systolic (congestive) heart failure: Secondary | ICD-10-CM

## 2013-07-25 DIAGNOSIS — I472 Ventricular tachycardia: Secondary | ICD-10-CM

## 2013-07-25 DIAGNOSIS — I428 Other cardiomyopathies: Secondary | ICD-10-CM

## 2013-07-25 MED ORDER — CARVEDILOL 25 MG PO TABS: 25.0000 mg | ORAL_TABLET | Freq: Two times a day (BID) | ORAL | Status: DC

## 2013-08-04 ENCOUNTER — Encounter (HOSPITAL_COMMUNITY): Payer: Self-pay

## 2013-08-04 ENCOUNTER — Ambulatory Visit (HOSPITAL_COMMUNITY)
Admission: RE | Admit: 2013-08-04 | Discharge: 2013-08-04 | Disposition: A | Discharge status: Home / Self Care | Payer: Managed Care, Other (non HMO) | Source: Ambulatory Visit | Attending: Internal Medicine | Admitting: Internal Medicine

## 2013-08-04 VITALS — BP 90/64 | HR 76 | Wt 213.1 lb

## 2013-08-04 DIAGNOSIS — I4891 Unspecified atrial fibrillation: Secondary | ICD-10-CM | POA: Insufficient documentation

## 2013-08-04 DIAGNOSIS — E039 Hypothyroidism, unspecified: Secondary | ICD-10-CM | POA: Insufficient documentation

## 2013-08-04 DIAGNOSIS — F3289 Other specified depressive episodes: Secondary | ICD-10-CM | POA: Insufficient documentation

## 2013-08-04 DIAGNOSIS — J45909 Unspecified asthma, uncomplicated: Secondary | ICD-10-CM | POA: Insufficient documentation

## 2013-08-04 DIAGNOSIS — F329 Major depressive disorder, single episode, unspecified: Secondary | ICD-10-CM | POA: Insufficient documentation

## 2013-08-04 DIAGNOSIS — Z79899 Other long term (current) drug therapy: Secondary | ICD-10-CM | POA: Insufficient documentation

## 2013-08-04 DIAGNOSIS — I428 Other cardiomyopathies: Secondary | ICD-10-CM

## 2013-08-04 DIAGNOSIS — I5022 Chronic systolic (congestive) heart failure: Secondary | ICD-10-CM

## 2013-08-04 DIAGNOSIS — Z950 Presence of cardiac pacemaker: Secondary | ICD-10-CM | POA: Insufficient documentation

## 2013-08-04 DIAGNOSIS — K219 Gastro-esophageal reflux disease without esophagitis: Secondary | ICD-10-CM | POA: Insufficient documentation

## 2013-08-04 DIAGNOSIS — G4700 Insomnia, unspecified: Secondary | ICD-10-CM | POA: Insufficient documentation

## 2013-08-04 DIAGNOSIS — I509 Heart failure, unspecified: Secondary | ICD-10-CM | POA: Insufficient documentation

## 2013-08-04 NOTE — Patient Instructions (Addendum)
Your physician has requested that you have an echocardiogram. Echocardiography is a painless test that uses sound waves to create images of your heart. It provides your doctor with information about the size and shape of your heart and how well your heart's chambers and valves are working. This procedure takes approximately one hour. There are no restrictions for this procedure.  Keep follow up appointment as scheduled 08/15/13

## 2013-08-04 NOTE — Progress Notes (Signed)
Patient ID: BARNDON TENBUSCH, male   DOB: Aug 17, 1961, 52 y.o.   MRN: [id number]  HPI: [name] is a 52 y/o male with long h/o CHF due to NICM (? related to chemo/XRT) with EF 25% s/p CRT-D previously followed by Dr. Melvern Banker.  PMHx notable for Hodgkins lymphona (treated age [age]) Treated with high dose chemo - including adriamycin and XRT. Also has AF, VT, PUD and asthma. Developed NICM shortly thereafter. Initial EF 13%. Previously een at [facility] transplant clinic and felt to be doing too well to require Tx. Cath with normal coronaries. Has not been able to tolerate spironolactone or Inspra due to painful gynecomastia requiring surgery.  Previously in HF-ACTION with pVO2 in mid to low 20s.  Stopped statin due to myalgias.  07/02/2011 ECHO 25% 05/2012 ECHO EF 25%  10/2012 Dr [name] reprogrammed BiV device due to PMT NSVT  CPX 5/12:  pVO2 17.3 (59% predicted) VeVCo2 slope 38.4 RER 1.14  CPX 02/28/13  PVO2 19 (60% predicted) VEVCO2 slope 31. RER1.16  He presents for acute add-on. Says he felt well on Sunday. On Monday developed fleeting R-sided chest pain. On Monday developed back pain which he has chronically from previous accident but usually comes and goes. This time it was persistent. On Wednesday felt great and still had the mild back pain. However last night was unable to sleep at all. Does not know why. Denies orthopnea or PND. No ruminating. Weight stable. Compliant with medications. Adjusting diuretics as needed. He is limiting fluid intake to < 2 liters per day. Denies stimulant use. No significant change in exercise tolerance. Was able to walk 3 miles in [facility] on Monday and was fine.   ROS: All systems negative except as listed in HPI, PMH and Problem List.  Past Medical History  Diagnosis Date  . CHF (congestive heart failure)   . Hodgkin's lymphoma 1980    IIIB  . A-fib   . GERD (gastroesophageal reflux disease)   . Asthma   . Pacemaker 2011    [place] device/2011  . Hypothyroidism   . Depression   . Pneumonia     recurrent pneumonia sedf.  rad tx for lymphoma    Current Outpatient Prescriptions  Medication Sig Dispense Refill  . acetaminophen (TYLENOL) 500 MG tablet Take 500 mg by mouth as needed. 1 tab at bed time for back pain       . carvedilol (COREG) 25 MG tablet Take 1 tablet (25 mg total) by mouth 2 (two) times daily with a meal.  180 tablet  2  . furosemide (LASIX) 40 MG tablet 60 mg. Take one tablet twice a day      . levothyroxine (SYNTHROID, LEVOTHROID) 150 MCG tablet Take 150 mcg by mouth daily.      Marland Kitchen loratadine (CLARITIN) 10 MG tablet Take 10 mg by mouth daily.      . quinapril (ACCUPRIL) 10 MG tablet Take 15 mg by mouth 2 (two) times daily. Taking one and one half tab bid      . RABEprazole (ACIPHEX) 20 MG tablet Take 1 tablet (20 mg total) by mouth 2 (two) times daily.  180 tablet  3   No current facility-administered medications for this encounter.     PHYSICAL EXAM: Filed Vitals:   08/04/13 1421  BP: 90/64  Pulse: 76  Weight: 213 lb 1.9 oz (96.671 kg)  SpO2: 96%    General:  Well appearing. No resp difficulty HEENT: normal Neck: supple.  JVP 5. Carotids 2+ bilaterally; no bruits. No lymphadenopathy or thryomegaly appreciated. Cor: PMI normal. Regular rate & rhythm. No rubs, gallops or murmurs. Lungs: clear Abdomen: soft, nontender, mildly distended. No hepatosplenomegaly. No bruits or masses. Good bowel sounds. Extremities: no cyanosis, clubbing, rash, edema Neuro: alert & orientedx3, cranial nerves grossly intact. Moves all 4 extremities w/o difficulty. Affect pleasant.    ASSESSMENT & PLAN:  1. Insomnia    --I doubt this is cardiac in nature at this point. Volume status looks good. Will continue to monitor.     --We discussed trying Lorrin Mais but taking an extra half tablet of carvedilol at bedtime has worked for him in past so we will start with that. Unable to tolerate benadryl without  excessive fatigue.   2. Chronic heart failure    --appears table NYHA II-III. On good meds. Will repeat echo. Will plan CPX early next year but if functional status getting worse will move up.    Phiona Ramnauth,MD 2:52 PM

## 2013-08-04 NOTE — Addendum Note (Signed)
Encounter addended by: Scarlette Calico, RN on: 08/04/2013  2:57 PM<BR>     Documentation filed: Patient Instructions Section, Orders

## 2013-08-09 ENCOUNTER — Ambulatory Visit (HOSPITAL_COMMUNITY): Payer: Managed Care, Other (non HMO)

## 2013-08-12 ENCOUNTER — Ambulatory Visit (HOSPITAL_COMMUNITY)
Admission: RE | Admit: 2013-08-12 | Discharge: 2013-08-12 | Disposition: A | Discharge status: Home / Self Care | Payer: Managed Care, Other (non HMO) | Source: Ambulatory Visit | Attending: Internal Medicine | Admitting: Internal Medicine

## 2013-08-12 ENCOUNTER — Telehealth (HOSPITAL_COMMUNITY): Payer: Self-pay | Admitting: Cardiology

## 2013-08-12 DIAGNOSIS — R0989 Other specified symptoms and signs involving the circulatory and respiratory systems: Secondary | ICD-10-CM | POA: Insufficient documentation

## 2013-08-12 DIAGNOSIS — I509 Heart failure, unspecified: Secondary | ICD-10-CM | POA: Insufficient documentation

## 2013-08-12 DIAGNOSIS — R0609 Other forms of dyspnea: Secondary | ICD-10-CM | POA: Insufficient documentation

## 2013-08-12 DIAGNOSIS — I5022 Chronic systolic (congestive) heart failure: Secondary | ICD-10-CM

## 2013-08-12 DIAGNOSIS — I319 Disease of pericardium, unspecified: Secondary | ICD-10-CM

## 2013-08-12 DIAGNOSIS — I428 Other cardiomyopathies: Secondary | ICD-10-CM | POA: Insufficient documentation

## 2013-08-12 NOTE — Telephone Encounter (Signed)
Pt aware.

## 2013-08-12 NOTE — Progress Notes (Signed)
  Echocardiogram 2D Echocardiogram has been performed.  Glenn Gonzalez Glenn Gonzalez 08/12/2013, 10:38 AM

## 2013-08-12 NOTE — Telephone Encounter (Signed)
Message copied by Tamilyn Lupien, Sharlot Gowda on Fri Aug 12, 2013  4:57 PM ------      Message from: Jolaine Artist      Created: Fri Aug 12, 2013  3:36 PM       EF 20-25% this is relatively stable ------

## 2013-08-15 ENCOUNTER — Ambulatory Visit (HOSPITAL_COMMUNITY): Payer: Managed Care, Other (non HMO)

## 2013-08-16 ENCOUNTER — Other Ambulatory Visit: Payer: Self-pay

## 2013-08-18 ENCOUNTER — Ambulatory Visit (HOSPITAL_COMMUNITY): Payer: Managed Care, Other (non HMO)

## 2013-08-19 ENCOUNTER — Other Ambulatory Visit: Payer: Self-pay

## 2013-08-31 ENCOUNTER — Encounter (HOSPITAL_COMMUNITY): Payer: Self-pay

## 2013-08-31 ENCOUNTER — Ambulatory Visit (HOSPITAL_COMMUNITY)
Admission: RE | Admit: 2013-08-31 | Discharge: 2013-08-31 | Disposition: A | Discharge status: Home / Self Care | Payer: Managed Care, Other (non HMO) | Source: Ambulatory Visit | Attending: Internal Medicine | Admitting: Internal Medicine

## 2013-08-31 VITALS — BP 92/64 | HR 72 | Ht 73.5 in | Wt 215.0 lb

## 2013-08-31 DIAGNOSIS — Z79899 Other long term (current) drug therapy: Secondary | ICD-10-CM | POA: Insufficient documentation

## 2013-08-31 DIAGNOSIS — K219 Gastro-esophageal reflux disease without esophagitis: Secondary | ICD-10-CM | POA: Insufficient documentation

## 2013-08-31 DIAGNOSIS — E039 Hypothyroidism, unspecified: Secondary | ICD-10-CM | POA: Insufficient documentation

## 2013-08-31 DIAGNOSIS — I5022 Chronic systolic (congestive) heart failure: Secondary | ICD-10-CM

## 2013-08-31 DIAGNOSIS — Z9221 Personal history of antineoplastic chemotherapy: Secondary | ICD-10-CM | POA: Insufficient documentation

## 2013-08-31 DIAGNOSIS — Z923 Personal history of irradiation: Secondary | ICD-10-CM | POA: Insufficient documentation

## 2013-08-31 DIAGNOSIS — Z8571 Personal history of Hodgkin lymphoma: Secondary | ICD-10-CM | POA: Insufficient documentation

## 2013-08-31 DIAGNOSIS — I509 Heart failure, unspecified: Secondary | ICD-10-CM | POA: Insufficient documentation

## 2013-08-31 DIAGNOSIS — R0602 Shortness of breath: Secondary | ICD-10-CM

## 2013-08-31 DIAGNOSIS — Z95810 Presence of automatic (implantable) cardiac defibrillator: Secondary | ICD-10-CM | POA: Insufficient documentation

## 2013-08-31 LAB — BASIC METABOLIC PANEL
BUN: 26 mg/dL — ABNORMAL HIGH (ref 6–23)
CO2: 27 mEq/L (ref 19–32)
Chloride: 99 mEq/L (ref 96–112)
Creatinine, Ser: 0.93 mg/dL (ref 0.50–1.35)
GFR calc Af Amer: >90 mL/min (ref >90)
Glucose, Bld: 103 mg/dL — ABNORMAL HIGH (ref 70–99)
Potassium: 4.1 mEq/L (ref 3.5–5.1)

## 2013-08-31 LAB — CBC
HCT: 44.9 % (ref 39.0–52.0)
Hemoglobin: 15.7 g/dL (ref 13.0–17.0)
MCH: 32.5 pg (ref 26.0–34.0)
MCHC: 35 g/dL (ref 30.0–36.0)
MCV: 93 fL (ref 78.0–100.0)
RDW: 15 % (ref 11.5–15.5)

## 2013-08-31 NOTE — Progress Notes (Signed)
Patient ID: Glenn Gonzalez, male   DOB: 1961-05-25, 52 y.o.   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 related to chemo/XRT) with EF 25% s/p CRT-D previously followed by Dr. Melvern Banker.  PMHx notable for Hodgkins lymphona (treated age [age]) Treated with high dose chemo - including adriamycin and XRT. Also has AF, VT, PUD and asthma. Developed NICM shortly thereafter. Initial EF 13%. Previously een at [facility] transplant clinic and felt to be doing too well to require Tx. Cath with normal coronaries. Has not been able to tolerate spironolactone or Inspra due to painful gynecomastia requiring surgery.  Previously in HF-ACTION with pVO2 in mid to low 20s.  Stopped statin due to myalgias.  07/02/2011 ECHO 25% 05/2012 ECHO EF 25%  10/2012 Dr [name] reprogrammed BiV device due to PMT NSVT 08/12/13 EF 20-25%  CPX 5/12:  pVO2 17.3 (59% predicted) VeVCo2 slope 38.4 RER 1.14  CPX 02/28/13  PVO2 19 (60% predicted) VEVCO2 slope 31. RER1.16  He for f/u. Now exercising 3x/week on elliptical. If tries to do more gets fatigued. Also struggling with his fluid which goes up and down. Weight stays up for a few days and then comes down. Currently takes 60 of lasix every day and when fluid is up takes an extra 20 without any benefit. Workload on elliptical hasn't changed but feels it is harder to get it done. No change in ability to do ADLs. Needs rest breaks or he is very wiped out for days. For 4 days was taking 200-400 mg of ibuprofen to help with back pain so he could sleep.   ROS: All systems negative except as listed in HPI, PMH and Problem List.  Past Medical History  Diagnosis Date  . CHF (congestive heart failure)   . Hodgkin's lymphoma 1980    IIIB  . A-fib   . GERD (gastroesophageal reflux disease)   . Asthma   . Pacemaker 2011    [place] device/2011  . Hypothyroidism   . Depression   . Pneumonia     recurrent pneumonia sedf.  rad tx for  lymphoma    Current Outpatient Prescriptions  Medication Sig Dispense Refill  . acetaminophen (TYLENOL) 500 MG tablet Take 500 mg by mouth as needed. 1 tab at bed time for back pain       . carvedilol (COREG) 25 MG tablet Take 1 tablet (25 mg total) by mouth 2 (two) times daily with a meal.  180 tablet  2  . furosemide (LASIX) 40 MG tablet Take 60 mg by mouth daily. May take two tabs (80mg ) as needed for swelling      . levothyroxine (SYNTHROID, LEVOTHROID) 150 MCG tablet Take 150 mcg by mouth daily.      Marland Kitchen loratadine (CLARITIN) 10 MG tablet Take 10 mg by mouth daily.      . quinapril (ACCUPRIL) 10 MG tablet Take 15 mg by mouth 2 (two) times daily. Taking one and one half tab bid      . RABEprazole (ACIPHEX) 20 MG tablet Take 1 tablet (20 mg total) by mouth 2 (two) times daily.  180 tablet  3   No current facility-administered medications for this encounter.     PHYSICAL EXAM: Filed Vitals:   08/31/13 0915  BP: 92/64  Pulse: 72  Height: 6' 1.5" (1.867 m)  Weight: 215 lb (97.523 kg)  SpO2: 96%    General:  Well appearing. No  resp difficulty HEENT: normal Neck: supple. JVP 5. Carotids 2+ bilaterally; no bruits. No lymphadenopathy or thryomegaly appreciated. Cor: PMI normal. Regular rate & rhythm. No rubs, gallops or murmurs. Lungs: clear Abdomen: soft, nontender, mildly distended. No hepatosplenomegaly. No bruits or masses. Good bowel sounds. Extremities: no cyanosis, clubbing, rash, edema Neuro: alert & orientedx3, cranial nerves grossly intact. Moves all 4 extremities w/o difficulty. Affect pleasant.   ASSESSMENT & PLAN:  1. Chronic heart failure    Overall I think his HF is getting slightly more tenuous. Exercise capacity preserved but takes longer to recover and also harder to maintain volume status. I think most recent flare of fluid is due to ibuprofen. He has stopped that and will check in with orthopedist regarding other management of severe back/neck pain. If volume  overload recurs would take extra lasix 60 mg. Otherwise on good medicines as BP tolerates.  Will plan CPX early next year. Blood work today.    Acel Natzke,MD 9:53 AM

## 2013-08-31 NOTE — Addendum Note (Signed)
Encounter addended by: Scarlette Calico, RN on: 08/31/2013 10:13 AM<BR>     Documentation filed: Patient Instructions Section, Orders

## 2013-08-31 NOTE — Patient Instructions (Addendum)
Labs today  Your physician recommends that you schedule a follow-up appointment in: 4 weeks   

## 2013-09-09 ENCOUNTER — Telehealth (HOSPITAL_COMMUNITY): Payer: Self-pay | Admitting: Cardiology

## 2013-09-09 NOTE — Telephone Encounter (Signed)
Pt called to request rx for Digoxin. Pt states he and Dr.Bensimhon have talked about this in the past and pt feels now he is ready to start medication Pt is having mild SOB and is hoping this med can help manage this.  Please advise

## 2013-09-12 ENCOUNTER — Telehealth (HOSPITAL_COMMUNITY): Payer: Self-pay | Admitting: Anesthesiology

## 2013-09-12 MED ORDER — DIGOXIN 250 MCG PO TABS: 0.2500 mg | ORAL_TABLET | Freq: Every day | ORAL | Status: DC

## 2013-09-12 NOTE — Telephone Encounter (Signed)
Returned call from patient about interested in starting digoxin. Discussed with Dr. Haroldine Laws and he is fine with starting patient on digoxin 0.25 mg daily. Have sent prescription to [facility] on [address].

## 2013-09-27 NOTE — Progress Notes (Signed)
Patient ID: Glenn Gonzalez, male   DOB: [birth year], 52 y.o.   MRN: [id number]  PCP: Dr Osborne Casco  HPI: Glenn Gonzalez is a 52 y/o male with long h/o CHF due to NICM (? related to chemo/XRT) with EF 25% s/p CRT-D previously followed by Dr. Melvern Banker.  PMHx notable for Hodgkins lymphona (treated age [age]) Treated with high dose chemo - including adriamycin and XRT. Also has AF, VT, PUD and asthma. Developed NICM shortly thereafter. Initial EF 13%. Previously een at [facility] transplant clinic and felt to be doing too well to require Tx. Cath with normal coronaries. Has not been able to tolerate spironolactone or Inspra due to painful gynecomastia requiring surgery.Stopped statin due to myalgias.  Previously in HF-ACTION with pVO2 in mid to low 20s.  Stopped statin due to myalgias.  07/02/2011 ECHO 25% 05/2012 ECHO EF 25%  10/2012 Dr [name] reprogrammed BiV device due to PMT NSVT 08/12/13 EF 20-25%  CPX 5/12:  pVO2 17.3 (59% predicted) VeVCo2 slope 38.4 RER 1.14  CPX 02/28/13  PVO2 19 (60% predicted) VEVCO2 slope 31. RER1.16  Labs  02/04/13 224  01/25/13 Pro BNP 2660 08/31/13 K 4.1 Creatinine 0.93 Pro BNP 1929   He returns for follow up. Last visit he was given a prescription for digoxin 0.25 mg daily but he has not started. He had trouble with digoxin in the past and is concerned because what he read says that it will not extend his life.  Denies PND/orthopnea. He does not seem limited in terms of his activities but says that he notes mild dyspnea most of the time.  He can climb a flight of steps with mild dyspnea.  He can walk as far as he wants with mild dyspnea.  No chest pain.  Weight at home 208-210 pounds.  On our scales, weight is stable.  He does feel like he is retaining fluid.  Says he is able to tolerate exercise  20 minutes at a time 5 days a week.    ROS: All systems negative except as listed in HPI, PMH and Problem List.  Past Medical History  Diagnosis Date  . CHF (congestive heart failure)    . Hodgkin's lymphoma 1980    IIIB  . A-fib   . GERD (gastroesophageal reflux disease)   . Asthma   . Pacemaker 2011    [place] device/2011  . Hypothyroidism   . Depression   . Pneumonia     recurrent pneumonia sedf.  rad tx for lymphoma    Current Outpatient Prescriptions  Medication Sig Dispense Refill  . acetaminophen (TYLENOL) 500 MG tablet Take 500 mg by mouth as needed. 1 tab at bed time for back pain       . carvedilol (COREG) 25 MG tablet Take 1 tablet (25 mg total) by mouth 2 (two) times daily with a meal.  180 tablet  2  . furosemide (LASIX) 40 MG tablet Take 60 mg by mouth daily. May take two tabs (80mg ) as needed for swelling      . levothyroxine (SYNTHROID, LEVOTHROID) 150 MCG tablet Take 150 mcg by mouth daily.      Marland Kitchen loratadine (CLARITIN) 10 MG tablet Take 10 mg by mouth daily.      . quinapril (ACCUPRIL) 10 MG tablet Take 15 mg by mouth 2 (two) times daily. Taking one and one half tab bid      . RABEprazole (ACIPHEX) 20 MG tablet Take 1 tablet (20 mg total) by mouth  2 (two) times daily.  180 tablet  3  . digoxin (LANOXIN) 0.25 MG tablet Take 1 tablet (0.25 mg total) by mouth daily.  90 tablet  3   No current facility-administered medications for this encounter.     PHYSICAL EXAM: Filed Vitals:   09/28/13 0907  BP: 104/88  Pulse: 77  Weight: 216 lb (97.977 kg)  SpO2: 98%    General:  Well appearing. No resp difficulty  HEENT: normal Neck: supple. JVP ~ 6 . Carotids 2+ bilaterally; no bruits. No lymphadenopathy or thryomegaly appreciated. Cor: PMI normal. Regular rate & rhythm. No rubs, gallops or murmurs. Lungs: clear Abdomen: soft, nontender, mildly distended. No hepatosplenomegaly. No bruits or masses. Good bowel sounds. Extremities: no cyanosis, clubbing, rash, edema Neuro: alert & orientedx3, cranial nerves grossly intact. Moves all 4 extremities w/o difficulty. Affect pleasant.   ASSESSMENT & PLAN:  1. Chronic systolic CHF:  ECHO 0000000 EF 25%, NICM thought to be from chemo (adriamycin) S/P CRT-D. NYHA III symptoms.  - Though on exam he does not appear markedly volume overloaded he is symptomatic and BNP remains high.  We will try increasing Lasix to 80 mg daily with BMET/BNP next week.   - Continue carvedilol 25 mg twice a day.  - Continue quinapril 15 mg twice a day - Intolerant spironolactone or Inspra due to painful gynecomastia requiring surgery - He wants to stay off digoxin for now.  If he is not symptomatically improved at followup in 2 wks, will start digoxin 0.125 mg daily.   2. History of lymphoma - treated ( Age [age])  with adriamycin  3. Hypothyroid- On synthroid. Per PCP Dr Osborne Casco.   Follow up in 2 weeks        CLEGG,AMY,NP-C  9:16 AM  Patient seen with NP, agree with the above note.  Patient remains symptomatic in terms of dyspnea but actually does not seem particularly limited.  Exam does not reveal marked fluid but BNP remains elevated.  As above, he has not wanted to take digoxin.  Today, we will try increasing Lasix to 80 mg daily to see if this helps symptomatically.  He will have BMET/BNP next week.  Followup in 2 wks, if no improvement will start digoxin 0.125 mg daily.   Loralie Champagne 09/28/2013 10:02 AM

## 2013-09-28 ENCOUNTER — Ambulatory Visit (HOSPITAL_COMMUNITY)
Admission: RE | Admit: 2013-09-28 | Discharge: 2013-09-28 | Disposition: A | Discharge status: Home / Self Care | Payer: Managed Care, Other (non HMO) | Source: Ambulatory Visit | Attending: Internal Medicine | Admitting: Internal Medicine

## 2013-09-28 VITALS — BP 104/88 | HR 77 | Wt 216.0 lb

## 2013-09-28 DIAGNOSIS — I5022 Chronic systolic (congestive) heart failure: Secondary | ICD-10-CM | POA: Diagnosis not present

## 2013-09-28 MED ORDER — FUROSEMIDE 40 MG PO TABS: 80.0000 mg | ORAL_TABLET | Freq: Every day | ORAL | Status: DC

## 2013-09-28 NOTE — Patient Instructions (Signed)
Follow up in 2 weeks.   Take lasix 80 mg daily    Do the following things EVERYDAY: 1) Weigh yourself in the morning before breakfast. Write it down and keep it in a log. 2) Take your medicines as prescribed 3) Eat low salt foods-Limit salt (sodium) to 2000 mg per day.  4) Stay as active as you can everyday 5) Limit all fluids for the day to less than 2 liters

## 2013-10-05 ENCOUNTER — Ambulatory Visit (INDEPENDENT_AMBULATORY_CARE_PROVIDER_SITE_OTHER): Payer: Managed Care, Other (non HMO) | Admitting: *Deleted

## 2013-10-05 ENCOUNTER — Other Ambulatory Visit: Payer: Managed Care, Other (non HMO)

## 2013-10-05 DIAGNOSIS — I5022 Chronic systolic (congestive) heart failure: Secondary | ICD-10-CM

## 2013-10-05 DIAGNOSIS — I472 Ventricular tachycardia, unspecified: Secondary | ICD-10-CM

## 2013-10-05 DIAGNOSIS — R0602 Shortness of breath: Secondary | ICD-10-CM

## 2013-10-05 LAB — BRAIN NATRIURETIC PEPTIDE: Pro B Natriuretic peptide (BNP): 250 pg/mL — ABNORMAL HIGH (ref 0.0–100.0)

## 2013-10-05 LAB — BASIC METABOLIC PANEL
BUN: 42 mg/dL — ABNORMAL HIGH (ref 6–23)
Chloride: 97 mEq/L (ref 96–112)
Creatinine, Ser: 1.7 mg/dL — ABNORMAL HIGH (ref 0.4–1.5)
Glucose, Bld: 104 mg/dL — ABNORMAL HIGH (ref 70–99)
Potassium: 5 mEq/L (ref 3.5–5.1)

## 2013-10-05 NOTE — Addendum Note (Signed)
Encounter addended by: Scarlette Calico, RN on: 10/05/2013 10:22 AM<BR>     Documentation filed: Orders

## 2013-10-06 ENCOUNTER — Other Ambulatory Visit: Payer: Self-pay

## 2013-10-11 ENCOUNTER — Ambulatory Visit (HOSPITAL_COMMUNITY)
Admission: RE | Admit: 2013-10-11 | Discharge: 2013-10-11 | Disposition: A | Discharge status: Home / Self Care | Payer: Managed Care, Other (non HMO) | Source: Ambulatory Visit | Attending: Internal Medicine | Admitting: Internal Medicine

## 2013-10-11 VITALS — HR 73 | Wt 216.0 lb

## 2013-10-11 DIAGNOSIS — I959 Hypotension, unspecified: Secondary | ICD-10-CM

## 2013-10-11 DIAGNOSIS — I4891 Unspecified atrial fibrillation: Secondary | ICD-10-CM | POA: Insufficient documentation

## 2013-10-11 DIAGNOSIS — I5022 Chronic systolic (congestive) heart failure: Secondary | ICD-10-CM

## 2013-10-11 DIAGNOSIS — I509 Heart failure, unspecified: Secondary | ICD-10-CM | POA: Insufficient documentation

## 2013-10-11 DIAGNOSIS — Z8571 Personal history of Hodgkin lymphoma: Secondary | ICD-10-CM | POA: Insufficient documentation

## 2013-10-11 DIAGNOSIS — Z95810 Presence of automatic (implantable) cardiac defibrillator: Secondary | ICD-10-CM | POA: Insufficient documentation

## 2013-10-11 DIAGNOSIS — J45909 Unspecified asthma, uncomplicated: Secondary | ICD-10-CM | POA: Insufficient documentation

## 2013-10-11 DIAGNOSIS — K219 Gastro-esophageal reflux disease without esophagitis: Secondary | ICD-10-CM | POA: Insufficient documentation

## 2013-10-11 DIAGNOSIS — E861 Hypovolemia: Secondary | ICD-10-CM | POA: Insufficient documentation

## 2013-10-11 DIAGNOSIS — E039 Hypothyroidism, unspecified: Secondary | ICD-10-CM | POA: Insufficient documentation

## 2013-10-11 MED ORDER — FUROSEMIDE 40 MG PO TABS: 40.0000 mg | ORAL_TABLET | ORAL | Status: DC | PRN

## 2013-10-11 NOTE — Patient Instructions (Signed)
Follow up in 4 weeks  Take lasix as needed.   Do the following things EVERYDAY: 1) Weigh yourself in the morning before breakfast. Write it down and keep it in a log. 2) Take your medicines as prescribed 3) Eat low salt foods-Limit salt (sodium) to 2000 mg per day.  4) Stay as active as you can everyday 5) Limit all fluids for the day to less than 2 liters

## 2013-10-11 NOTE — Progress Notes (Signed)
Patient ID: Glenn Gonzalez, male   DOB: 06/15/1961, 52 y.o.   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 related to chemo/XRT) with EF 25% s/p CRT-D previously followed by Dr. Melvern Banker.  PMHx notable for Hodgkins lymphona (treated age [age]) Treated with high dose chemo - including adriamycin and XRT. Also has AF, VT, PUD and asthma. Developed NICM shortly thereafter. Initial EF 13%. Previously seen at [facility] transplant clinic and felt to be doing too well to require Tx. Cath with normal coronaries. Has not been able to tolerate spironolactone or Inspra due to painful gynecomastia requiring surgery.Stopped statin due to myalgias.  07/02/2011 ECHO 25% 05/2012 ECHO EF 25%  10/2012 Dr [name] reprogrammed BiV device due to PMT NSVT 08/12/13 EF 20-25%  CPX 5/12:  pVO2 17.3 (59% predicted) VeVCo2 slope 38.4 RER 1.14  CPX 02/28/13  PVO2 19 (60% predicted) VEVCO2 slope 31. RER1.16  Labs  02/04/13 224  01/25/13 Pro BNP 2660 08/31/13 K 4.1 Creatinine 0.93 Pro BNP 1929  10/05/13 K 5.0 Creatinine 1.7 Pro BNP 250  He returns for follow up. Last visit lasix increased to 80 mg daily. Overall he feels much better though he is sad over the recent death of his father from cancer. Denies SOB/PND/Orthopnea/CP. Intermittent dizziness.BP running low. Does not want to start digoxin. Weight at home 206 pounds. Taking all medications. Following low salt diet and limits fluid intake to < 2 liters daily. Exercises 5 days a week 20 minutes at a time.   ROS: All systems negative except as listed in HPI, PMH and Problem List.  Past Medical History  Diagnosis Date  . CHF (congestive heart failure)   . Hodgkin's lymphoma 1980    IIIB  . A-fib   . GERD (gastroesophageal reflux disease)   . Asthma   . Pacemaker 2011    [place] device/2011  . Hypothyroidism   . Depression   . Pneumonia     recurrent pneumonia sedf.  rad tx for lymphoma     Current Outpatient Prescriptions  Medication Sig Dispense Refill  . acetaminophen (TYLENOL) 500 MG tablet Take 500 mg by mouth as needed. 1 tab at bed time for back pain       . carvedilol (COREG) 25 MG tablet Take 1 tablet (25 mg total) by mouth 2 (two) times daily with a meal.  180 tablet  2  . furosemide (LASIX) 40 MG tablet Take 2 tablets (80 mg total) by mouth daily.  60 tablet  3  . levothyroxine (SYNTHROID, LEVOTHROID) 150 MCG tablet Take 150 mcg by mouth daily.      Marland Kitchen loratadine (CLARITIN) 10 MG tablet Take 10 mg by mouth daily.      . quinapril (ACCUPRIL) 10 MG tablet Take 15 mg by mouth 2 (two) times daily. Taking one and one half tab bid      . RABEprazole (ACIPHEX) 20 MG tablet Take 1 tablet (20 mg total) by mouth 2 (two) times daily.  180 tablet  3   No current facility-administered medications for this encounter.     PHYSICAL EXAM: Filed Vitals:   10/11/13 1041  Pulse: 73  Weight: 216 lb (97.977 kg)  SpO2: 97%  SBP 70-80 range  General:  Well appearing. No resp difficulty  HEENT: normal Neck: supple. JVP ~ 6 . Carotids 2+ bilaterally; no bruits. No lymphadenopathy or thryomegaly appreciated. Cor: PMI normal. Regular  rate & rhythm. No rubs, gallops or murmurs. Lungs: clear Abdomen: soft, nontender, mildly distended. No hepatosplenomegaly. No bruits or masses. Good bowel sounds. Extremities: no cyanosis, clubbing, rash, edema Neuro: alert & orientedx3, cranial nerves grossly intact. Moves all 4 extremities w/o difficulty. Affect pleasant.   ASSESSMENT & PLAN:  1. Chronic systolic CHF: ECHO 0000000 EF 25%, NICM thought to be from chemo (adriamycin) S/P CRT-D. NYHA III symptoms. Reviewed lab work from 10/05/13 - Volume status low.  Narrow euvolumeic window. Weight at home needs to be 207-209 pounds. Hold lasix for 2 days then. Cut back to lasix 40 mg as needed.  - Continue carvedilol 25 mg twice a day.  - Continue quinapril 15 mg twice a day - Intolerant  spironolactone or Inspra due to painful gynecomastia requiring surgery - He wants to stay off digoxin for now.  Will need repeat CPX soon.  Repeat BMET on Friday.   2. History of lymphoma - treated ( Age [age])  with adriamycin  3. Hypothyroid- On synthroid. Per PCP Dr Osborne Casco.   Follow up in 2 months     CLEGG,AMY,NP-C  10:51 AM  Patient seen and examined with Darrick Grinder, NP. We discussed all aspects of the encounter. I agree with the assessment and plan as stated above.   He is doing fairly well but I worry his HF is progressing slowly and steadily. He has a very narrow euvolemic and BP is low today in setting of only mild hypovolemia. We talked about possible need to re-initiate transplant work-up in the near future.   Agree with diuretic guidelines as above. Follow renal function closely Will repeat CPX in near future.   Benay Spice 5:52 PM

## 2013-10-14 ENCOUNTER — Telehealth: Payer: Self-pay | Admitting: Internal Medicine

## 2013-10-14 ENCOUNTER — Other Ambulatory Visit (INDEPENDENT_AMBULATORY_CARE_PROVIDER_SITE_OTHER): Payer: Managed Care, Other (non HMO)

## 2013-10-14 ENCOUNTER — Ambulatory Visit (INDEPENDENT_AMBULATORY_CARE_PROVIDER_SITE_OTHER): Payer: Managed Care, Other (non HMO) | Admitting: *Deleted

## 2013-10-14 DIAGNOSIS — I5022 Chronic systolic (congestive) heart failure: Secondary | ICD-10-CM | POA: Diagnosis not present

## 2013-10-14 DIAGNOSIS — I472 Ventricular tachycardia: Secondary | ICD-10-CM

## 2013-10-14 DIAGNOSIS — I4589 Other specified conduction disorders: Secondary | ICD-10-CM | POA: Diagnosis not present

## 2013-10-14 DIAGNOSIS — Z95810 Presence of automatic (implantable) cardiac defibrillator: Secondary | ICD-10-CM

## 2013-10-14 LAB — BASIC METABOLIC PANEL
BUN: 33 mg/dL — ABNORMAL HIGH (ref 6–23)
CO2: 27 mEq/L (ref 19–32)
Calcium: 9.3 mg/dL (ref 8.4–10.5)
Chloride: 102 mEq/L (ref 96–112)
Creatinine, Ser: 1.3 mg/dL (ref 0.4–1.5)
Glucose, Bld: 101 mg/dL — ABNORMAL HIGH (ref 70–99)

## 2013-10-14 LAB — MDC_IDC_ENUM_SESS_TYPE_INCLINIC
Date Time Interrogation Session: 20141114050000
Implantable Pulse Generator Serial Number: 588967
Lead Channel Impedance Value: 533 Ohm
Lead Channel Pacing Threshold Amplitude: 0.7 V
Lead Channel Pacing Threshold Amplitude: 0.7 V
Lead Channel Pacing Threshold Pulse Width: 0.4 ms
Lead Channel Pacing Threshold Pulse Width: 0.4 ms
Lead Channel Sensing Intrinsic Amplitude: 11 mV
Lead Channel Sensing Intrinsic Amplitude: 19.9 mV
Lead Channel Sensing Intrinsic Amplitude: 3 mV
Lead Channel Setting Pacing Amplitude: 2 V
Lead Channel Setting Pacing Pulse Width: 0.8 ms
Lead Channel Setting Sensing Sensitivity: 0.6 mV
Lead Channel Setting Sensing Sensitivity: 1 mV
Zone Setting Detection Interval: 273 ms
Zone Setting Detection Interval: 300 ms

## 2013-10-14 NOTE — Progress Notes (Signed)
CRT-D device check in office. Thresholds and sensing consistent with previous device measurements. Lead impedance trends stable over time. 5 AHRs---longest 3 sec. 66 NSVT---longest w/ EGM 4 beats. Patient bi-ventricularly pacing 100% of the time. Device programmed with appropriate safety margins. No changes made this session. Estimated longevity 28yrs.    Faxed indigent cell adapter request---[employer] 01/16/14 & ROV w/ Dr. [name] 05/2014.

## 2013-10-14 NOTE — Telephone Encounter (Signed)
Rec'd SN for [employer] cell adapter application--I faxed request to [employer].

## 2013-10-14 NOTE — Telephone Encounter (Signed)
Follow Up   Pt returning call// please call back   Serial # [phone]

## 2013-10-17 ENCOUNTER — Encounter: Payer: Self-pay | Admitting: Internal Medicine

## 2013-10-23 DIAGNOSIS — I959 Hypotension, unspecified: Secondary | ICD-10-CM | POA: Insufficient documentation

## 2013-11-14 ENCOUNTER — Encounter (HOSPITAL_COMMUNITY): Payer: Self-pay

## 2013-11-14 ENCOUNTER — Ambulatory Visit (HOSPITAL_COMMUNITY)
Admission: RE | Admit: 2013-11-14 | Discharge: 2013-11-14 | Disposition: A | Discharge status: Home / Self Care | Payer: Managed Care, Other (non HMO) | Source: Ambulatory Visit | Attending: Internal Medicine | Admitting: Internal Medicine

## 2013-11-14 VITALS — BP 108/70 | HR 72 | Wt 217.0 lb

## 2013-11-14 DIAGNOSIS — I5022 Chronic systolic (congestive) heart failure: Secondary | ICD-10-CM | POA: Diagnosis not present

## 2013-11-14 DIAGNOSIS — I509 Heart failure, unspecified: Secondary | ICD-10-CM | POA: Insufficient documentation

## 2013-11-14 LAB — BASIC METABOLIC PANEL
BUN: 33 mg/dL — ABNORMAL HIGH (ref 6–23)
CO2: 23 mEq/L (ref 19–32)
Calcium: 9.1 mg/dL (ref 8.4–10.5)
Chloride: 101 mEq/L (ref 96–112)
GFR calc non Af Amer: 65 mL/min — ABNORMAL LOW (ref >90)
Potassium: 5.1 mEq/L (ref 3.5–5.1)
Sodium: 136 mEq/L (ref 135–145)

## 2013-11-14 LAB — PRO B NATRIURETIC PEPTIDE: Pro B Natriuretic peptide (BNP): 2970 pg/mL — ABNORMAL HIGH (ref 0–125)

## 2013-11-14 NOTE — Progress Notes (Signed)
Patient ID: Glenn Gonzalez, male   DOB: [birth year], 52 y.o.   MRN: [id number]  PCP: Dr Osborne Casco  HPI: [name] is a 53 y/o male with long h/o CHF due to NICM (? related to chemo/XRT) with EF 25% s/p CRT-D previously followed by Dr. Melvern Banker.  PMHx notable for Hodgkins lymphona (treated age 49-23) Treated with high dose chemo - including adriamycin and XRT. Also has AF, VT, PUD and asthma. Initial EF 13%. Previously seen at [facility] transplant clinic and felt to be doing too well to require Tx. Cath with normal coronaries. Has not been able to tolerate spironolactone or Inspra due to painful gynecomastia requiring surgery. Stopped statin due to myalgias.  07/02/2011 ECHO 25% 05/2012 ECHO EF 25%  10/2012 Dr [name] reprogrammed BiV device due to PMT NSVT 08/12/13 EF 20-25%  CPX 04/2011:  pVO2 17.3 (59% predicted) VeVCo2 slope 38.4 RER 1.14  CPX 02/28/13  PVO2 19 (60% predicted) VEVCO2 slope 31. RER1.16  Labs  02/04/13 224  01/25/13 Pro BNP 2660 08/31/13 K 4.1 Creatinine 0.93 Pro BNP 1929  10/05/13 K 5.0 Creatinine 1.7 Pro BNP 250  Follow up: Last visit lasix held due to worsening renal function - has narrow euvolemic window. Feels good. Watching salt even more closely. Exercising 6 days week - 30 minutes at a time on elliptical trainer. At level of fitness he was 2 years ago. Fatigue much improved. No longer taking regular lasix. Only as needed. Has only taken one tablet since last visit.  Weight stable.Denies SOB/PND/Orthopnea/CP. BP stable. Does not want to start digoxin.   ROS: All systems negative except as listed in HPI, PMH and Problem List.  Past Medical History  Diagnosis Date  . CHF (congestive heart failure)   . Hodgkin's lymphoma 1980    IIIB  . A-fib   . GERD (gastroesophageal reflux disease)   . Asthma   . Pacemaker 2011    [name]  . Hypothyroidism   . Depression   . Pneumonia     recurrent pneumonia sedf.  rad tx for lymphoma    Current  Outpatient Prescriptions  Medication Sig Dispense Refill  . acetaminophen (TYLENOL) 500 MG tablet Take 500 mg by mouth as needed. 1 tab at bed time for back pain       . carvedilol (COREG) 25 MG tablet Take 1 tablet (25 mg total) by mouth 2 (two) times daily with a meal.  180 tablet  2  . furosemide (LASIX) 40 MG tablet Take 1 tablet (40 mg total) by mouth as needed.  30 tablet  3  . levothyroxine (SYNTHROID, LEVOTHROID) 150 MCG tablet Take 150 mcg by mouth daily.      Marland Kitchen loratadine (CLARITIN) 10 MG tablet Take 10 mg by mouth daily.      . quinapril (ACCUPRIL) 10 MG tablet Take 15 mg by mouth 2 (two) times daily. Taking one and one half tab bid      . RABEprazole (ACIPHEX) 20 MG tablet Take 1 tablet (20 mg total) by mouth 2 (two) times daily.  180 tablet  3   No current facility-administered medications for this encounter.    Filed Vitals:   11/14/13 1035  BP: 108/70  Pulse: 72  Weight: 217 lb (98.431 kg)  SpO2: 97%    PHYSICAL EXAM: General:  Well appearing. No resp difficulty  HEENT: normal Neck: supple. JVP ~ 5- 6 . Carotids 2+ bilaterally; no bruits. No lymphadenopathy or thryomegaly appreciated. Cor: PMI normal.  Regular rate & rhythm. No rubs, gallops or murmurs. Lungs: clear Abdomen: soft, nontender, nondistended. No hepatosplenomegaly. No bruits or masses. Good bowel sounds. Extremities: no cyanosis, clubbing, rash, edema Neuro: alert & orientedx3, cranial nerves grossly intact. Moves all 4 extremities w/o difficulty. Affect pleasant.   ASSESSMENT & PLAN:  1. Chronic systolic CHF: NICM, EF 123456 (08/2013), NICM thought to be from chemo (adriamycin) S/P CRT-D.  - Improved. NYHA II-III symptoms.  - Volume status looks good.  Narrow euvolumeic window. Continue to use lasix on prn basis.  - Continue carvedilol 25 mg twice a day.  - Continue quinapril 15 mg twice a day - Intolerant spironolactone or Inspra due to painful gynecomastia requiring surgery - He wants to stay off  digoxin for now.  - Will consider repeat CPX soon.    2. History of lymphoma - treated ( Age 32-23)  with adriamycin  3. Hypothyroid- On synthroid. Per PCP Dr Osborne Casco.   He is much improved symptomatically but having to work much harder than previously to keep volume in check. I think we will need to proceed with CPX testing in near future to get an objective gauge of where he is at overall and if we are close to the point of considering advanced therapies. Check labs today.   Follow up in 2 months  Benay Spice 10:51 AM

## 2013-11-14 NOTE — Patient Instructions (Signed)
We will contact you in 2 months to schedule your next appointment and CPX (cardio pulmonary stress test)

## 2013-11-14 NOTE — Addendum Note (Signed)
Encounter addended by: Scarlette Calico, RN on: 11/14/2013 11:27 AM<BR>     Documentation filed: Patient Instructions Section, Orders

## 2014-01-04 ENCOUNTER — Ambulatory Visit (HOSPITAL_COMMUNITY): Payer: Managed Care, Other (non HMO) | Attending: Internal Medicine

## 2014-01-04 DIAGNOSIS — I5022 Chronic systolic (congestive) heart failure: Secondary | ICD-10-CM

## 2014-01-04 DIAGNOSIS — R0609 Other forms of dyspnea: Secondary | ICD-10-CM

## 2014-01-04 DIAGNOSIS — R0989 Other specified symptoms and signs involving the circulatory and respiratory systems: Secondary | ICD-10-CM

## 2014-01-10 ENCOUNTER — Encounter (HOSPITAL_COMMUNITY): Payer: Self-pay

## 2014-01-10 ENCOUNTER — Ambulatory Visit (HOSPITAL_COMMUNITY)
Admission: RE | Admit: 2014-01-10 | Discharge: 2014-01-10 | Disposition: A | Discharge status: Home / Self Care | Payer: Managed Care, Other (non HMO) | Source: Ambulatory Visit | Attending: Internal Medicine | Admitting: Internal Medicine

## 2014-01-10 VITALS — BP 110/56 | HR 77 | Ht 73.5 in | Wt 213.8 lb

## 2014-01-10 DIAGNOSIS — Z8571 Personal history of Hodgkin lymphoma: Secondary | ICD-10-CM | POA: Insufficient documentation

## 2014-01-10 DIAGNOSIS — J45909 Unspecified asthma, uncomplicated: Secondary | ICD-10-CM | POA: Insufficient documentation

## 2014-01-10 DIAGNOSIS — Z8711 Personal history of peptic ulcer disease: Secondary | ICD-10-CM | POA: Insufficient documentation

## 2014-01-10 DIAGNOSIS — K219 Gastro-esophageal reflux disease without esophagitis: Secondary | ICD-10-CM | POA: Insufficient documentation

## 2014-01-10 DIAGNOSIS — I4891 Unspecified atrial fibrillation: Secondary | ICD-10-CM | POA: Insufficient documentation

## 2014-01-10 DIAGNOSIS — Z79899 Other long term (current) drug therapy: Secondary | ICD-10-CM | POA: Insufficient documentation

## 2014-01-10 DIAGNOSIS — I509 Heart failure, unspecified: Secondary | ICD-10-CM | POA: Insufficient documentation

## 2014-01-10 DIAGNOSIS — E039 Hypothyroidism, unspecified: Secondary | ICD-10-CM | POA: Insufficient documentation

## 2014-01-10 DIAGNOSIS — F3289 Other specified depressive episodes: Secondary | ICD-10-CM | POA: Insufficient documentation

## 2014-01-10 DIAGNOSIS — Z950 Presence of cardiac pacemaker: Secondary | ICD-10-CM | POA: Insufficient documentation

## 2014-01-10 DIAGNOSIS — I5022 Chronic systolic (congestive) heart failure: Secondary | ICD-10-CM | POA: Diagnosis not present

## 2014-01-10 DIAGNOSIS — Z9221 Personal history of antineoplastic chemotherapy: Secondary | ICD-10-CM | POA: Insufficient documentation

## 2014-01-10 DIAGNOSIS — F329 Major depressive disorder, single episode, unspecified: Secondary | ICD-10-CM | POA: Insufficient documentation

## 2014-01-10 DIAGNOSIS — I472 Ventricular tachycardia, unspecified: Secondary | ICD-10-CM | POA: Insufficient documentation

## 2014-01-10 DIAGNOSIS — I4729 Other ventricular tachycardia: Secondary | ICD-10-CM | POA: Insufficient documentation

## 2014-01-10 DIAGNOSIS — Z923 Personal history of irradiation: Secondary | ICD-10-CM | POA: Insufficient documentation

## 2014-01-10 NOTE — Progress Notes (Signed)
Patient ID: ZEESHAN Gonzalez, male   DOB: 1961/11/09, 53 y.o.   MRN: [id number]  PCP: Dr Glenn Gonzalez  HPI: Glenn Gonzalez is a 53 y/o male with long h/o CHF due to NICM (? related to chemo/XRT) with EF 25% s/p CRT-D Glenn Gonzalez)  PMHx notable for Hodgkins lymphona (treated age 55-23) Treated with high dose chemo - including adriamycin and XRT. Also has AF, VT, PUD and asthma. Initial EF 13%. Previously seen at [facility] transplant clinic and felt to be doing too well to require Tx. Cath with normal coronaries. Has not been able to tolerate spironolactone or Inspra due to painful gynecomastia requiring surgery. Stopped statin due to myalgias.  07/02/2011 ECHO 25% 05/2012 ECHO EF 25%  10/2012 Dr Glenn Gonzalez reprogrammed BiV device due to PMT NSVT 08/12/13 EF 20-25%  CPX 04/2011:  pVO2 17.3 (59% predicted) VeVCo2 slope 38.4 RER 1.14  CPX 02/28/13  PVO2 19 (60% predicted) VEVCO2 slope 31. RER1.16  CPX 01/04/14 FVC 3.70 (67%)  FEV1 2.96 (69%)  Resting HR: 71 Peak HR: 101 (60.1% age predicted max HR) BP rest: 92/60 BP peak: 110/58 Peak VO2: 22.5 (64.6% predicted peak VO2) VE/VCO2 slope: 26.3 Peak RER: 1.17 Ventilatory Threshold: 15.6 (44.8% predicted peak VO2)   Labs  02/04/13 224  01/25/13 Pro BNP 2660 08/31/13 K 4.1 Creatinine 0.93 Pro BNP 1929  10/05/13 K 5.0 Creatinine 1.7 Pro BNP 250 01/09/14  K 4.2 Cr 1.14  Follow up: Recently had CPX for worsening HF symptoms. CPX actually showed improved functional capacity with normal slope. Exercises 30 min 5-6x/week. On elliptical machine. Pushes pretty hard. Feels good. Was feeling funny/orthostatic so cut accupril down from 15mg  bid to 10mg  bid and now feels better. Back taking lasix 40 mg daily for a month - renal function stable (had to cut lasix back a few months ago).   ROS: All systems negative except as listed in HPI, PMH and Problem List.  Past Medical History  Diagnosis Date  . CHF (congestive heart failure)   . Hodgkin's lymphoma 1980    IIIB  . A-fib   .  GERD (gastroesophageal reflux disease)   . Asthma   . Pacemaker 2011    Glenn Gonzalez device/2011  . Hypothyroidism   . Depression   . Pneumonia     recurrent pneumonia sedf.  rad tx for lymphoma    Current Outpatient Prescriptions  Medication Sig Dispense Refill  . acetaminophen (TYLENOL) 500 MG tablet Take 500 mg by mouth as needed. 1 tab at bed time for back pain       . carvedilol (COREG) 25 MG tablet Take 1 tablet (25 mg total) by mouth 2 (two) times daily with a meal.  180 tablet  2  . furosemide (LASIX) 40 MG tablet Take 1 tablet (40 mg total) by mouth as needed.  30 tablet  3  . gabapentin (NEURONTIN) 300 MG capsule Take 300 mg by mouth at bedtime.      Marland Kitchen levothyroxine (SYNTHROID, LEVOTHROID) 150 MCG tablet Take 150 mcg by mouth daily.      Marland Kitchen loratadine (CLARITIN) 10 MG tablet Take 10 mg by mouth daily.      . quinapril (ACCUPRIL) 10 MG tablet Take 10 mg by mouth 2 (two) times daily. Taking one and one half tab bid      . RABEprazole (ACIPHEX) 20 MG tablet Take 1 tablet (20 mg total) by mouth 2 (two) times daily.  180 tablet  3   No current facility-administered  medications for this encounter.    Filed Vitals:   01/10/14 1141  BP: 110/56  Pulse: 77  Height: 6' 1.5" (1.867 m)  Weight: 213 lb 12.8 oz (96.979 kg)  SpO2: 96%    PHYSICAL EXAM: General:  Well appearing. No resp difficulty  HEENT: normal Neck: supple. JVP ~ 5- 6 . Carotids 2+ bilaterally; no bruits. No lymphadenopathy or thryomegaly appreciated. Cor: PMI normal. Regular rate & rhythm. No rubs, gallops or murmurs. Lungs: clear Abdomen: soft, nontender, nondistended. No hepatosplenomegaly. No bruits or masses. Good bowel sounds. Extremities: no cyanosis, clubbing, rash, edema Neuro: alert & orientedx3, cranial nerves grossly intact. Moves all 4 extremities w/o difficulty. Affect pleasant.   ASSESSMENT & PLAN:  1. Chronic systolic CHF: NICM, EF 123456 (08/2013), NICM thought to be from chemo  (adriamycin) S/P CRT-D 2. History of lymphoma - treated ( Age 36-23)  with adriamycin 3. Hypothyroid- On synthroid. Per PCP Dr Glenn Gonzalez.   We reviewed CPX in detail. He is much improved. Now NYHA I-II. Currently taking 40mg  lasix per day. BUN up slightly but Cr ok. He has had to decrease lasix in past due to worsening renal function. He will watch closely for orthostasis and weight loss. Can decrease as needed. Lisinopril recently cut back will try to go back to 15 bid as tolerated. Does not want digoxin. Intolerant of spiro/eplerenon due to gynecomastia. We discussed CardioMems device. He is not interested at this point.   Follow up in 3 months  Benay Spice 12:38 PM

## 2014-01-16 ENCOUNTER — Encounter: Payer: Managed Care, Other (non HMO) | Admitting: *Deleted

## 2014-01-23 ENCOUNTER — Encounter: Payer: Self-pay | Admitting: *Deleted

## 2014-04-03 ENCOUNTER — Other Ambulatory Visit (HOSPITAL_COMMUNITY): Payer: Self-pay | Admitting: Anesthesiology

## 2014-04-03 DIAGNOSIS — C50919 Malignant neoplasm of unspecified site of unspecified female breast: Secondary | ICD-10-CM

## 2014-04-28 ENCOUNTER — Other Ambulatory Visit (HOSPITAL_COMMUNITY): Payer: Self-pay | Admitting: Internal Medicine

## 2014-05-01 ENCOUNTER — Ambulatory Visit (HOSPITAL_COMMUNITY)
Admission: RE | Admit: 2014-05-01 | Discharge: 2014-05-01 | Disposition: A | Discharge status: Home / Self Care | Payer: Managed Care, Other (non HMO) | Source: Ambulatory Visit | Attending: Internal Medicine | Admitting: Internal Medicine

## 2014-05-01 VITALS — BP 90/58 | HR 66 | Wt 215.2 lb

## 2014-05-01 DIAGNOSIS — I5022 Chronic systolic (congestive) heart failure: Secondary | ICD-10-CM | POA: Diagnosis not present

## 2014-05-01 DIAGNOSIS — J45909 Unspecified asthma, uncomplicated: Secondary | ICD-10-CM | POA: Diagnosis not present

## 2014-05-01 DIAGNOSIS — I509 Heart failure, unspecified: Secondary | ICD-10-CM | POA: Diagnosis present

## 2014-05-01 DIAGNOSIS — I4891 Unspecified atrial fibrillation: Secondary | ICD-10-CM | POA: Diagnosis not present

## 2014-05-01 DIAGNOSIS — Z950 Presence of cardiac pacemaker: Secondary | ICD-10-CM | POA: Insufficient documentation

## 2014-05-01 DIAGNOSIS — E039 Hypothyroidism, unspecified: Secondary | ICD-10-CM | POA: Insufficient documentation

## 2014-05-01 DIAGNOSIS — K219 Gastro-esophageal reflux disease without esophagitis: Secondary | ICD-10-CM | POA: Insufficient documentation

## 2014-05-01 LAB — TSH: TSH: 0.814 u[IU]/mL (ref 0.350–4.500)

## 2014-05-01 LAB — COMPREHENSIVE METABOLIC PANEL
ALT: 12 U/L (ref 0–53)
AST: 18 U/L (ref 0–37)
Albumin: 3.9 g/dL (ref 3.5–5.2)
Alkaline Phosphatase: 61 U/L (ref 39–117)
BUN: 27 mg/dL — ABNORMAL HIGH (ref 6–23)
CALCIUM: 9 mg/dL (ref 8.4–10.5)
CO2: 26 mEq/L (ref 19–32)
Chloride: 102 mEq/L (ref 96–112)
Creatinine, Ser: 1.15 mg/dL (ref 0.50–1.35)
GFR calc non Af Amer: 72 mL/min — ABNORMAL LOW (ref >90)
GFR, EST AFRICAN AMERICAN: 83 mL/min — AB (ref >90)
Glucose, Bld: 95 mg/dL (ref 70–99)
Potassium: 4.5 mEq/L (ref 3.7–5.3)
SODIUM: 140 meq/L (ref 137–147)
Total Bilirubin: 0.5 mg/dL (ref 0.3–1.2)
Total Protein: 6.7 g/dL (ref 6.0–8.3)

## 2014-05-01 LAB — T3, FREE: T3 FREE: 3.1 pg/mL (ref 2.3–4.2)

## 2014-05-01 LAB — PRO B NATRIURETIC PEPTIDE: Pro B Natriuretic peptide (BNP): 2166 pg/mL — ABNORMAL HIGH (ref 0–125)

## 2014-05-01 LAB — T4, FREE: Free T4: 1.5 ng/dL (ref 0.80–1.80)

## 2014-05-01 NOTE — Patient Instructions (Signed)
Labs today  We will contact you in 3 months to schedule your next appointment.  

## 2014-05-01 NOTE — Addendum Note (Signed)
Encounter addended by: Scarlette Calico, RN on: 05/01/2014  1:05 PM<BR>     Documentation filed: Patient Instructions Section, Orders

## 2014-05-01 NOTE — Progress Notes (Signed)
Patient ID: Glenn Gonzalez, male   DOB: 01-Jul-1961, 53 y.o.   MRN: [id number]  PCP: Dr Glenn Gonzalez  HPI: Glenn Gonzalez is a 53 y/o male with long h/o CHF due to NICM (? related to chemo/XRT) with EF 25% s/p CRT-D [facility])  PMHx notable for Hodgkins lymphona (treated age 61-23) Treated with high dose chemo - including adriamycin and XRT. Also has AF, VT, PUD and asthma. Initial EF 13%. Previously seen at [facility] transplant clinic and felt to be doing too well to require Tx. Cath with normal coronaries. Has not been able to tolerate spironolactone or Inspra due to painful gynecomastia requiring surgery. Stopped statin due to myalgias.  07/02/2011 ECHO 25% 05/2012 ECHO EF 25%  10/2012 Dr Glenn Gonzalez reprogrammed BiV device due to PMT NSVT 08/12/13 EF 20-25%  CPX 04/2011:  pVO2 17.3 (59% predicted) VeVCo2 slope 38.4 RER 1.14  CPX 02/28/13  PVO2 19 (60% predicted) VEVCO2 slope 31. RER1.16  CPX 01/04/14 FVC 3.70 (67%)  FEV1 2.96 (69%)  Resting HR: 71 Peak HR: 101 (60.1% age predicted max HR) BP rest: 92/60 BP peak: 110/58 Peak VO2: 22.5 (64.6% predicted peak VO2) VE/VCO2 slope: 26.3 Peak RER: 1.17 Ventilatory Threshold: 15.6 (44.8% predicted peak VO2)   Labs  02/04/13 224  01/25/13 Pro BNP 2660 08/31/13 K 4.1 Creatinine 0.93 Pro BNP 1929  10/05/13 K 5.0 Creatinine 1.7 Pro BNP 250 01/09/14  K 4.2 Cr 1.14  Follow up for HF: Overall doing fairly well. Exercise tolerance well preserved. Exercising 30 mins per day on the elliptical at decent intensity. Feels fine during exercise but does take an hour or two to recover and feels fatigued. Recently cut lasix back to 20 daily but had edema and had to go back to 40 daily. Weight now stable. No problems with meds. Feels he gets depressed if he has to take extra lasix. 1-2 times per week feels pre-syncopal with SBP 90.    ROS: All systems negative except as listed in HPI, PMH and Problem List.  Past Medical History  Diagnosis Date  . CHF (congestive heart failure)    . Hodgkin's lymphoma 1980    IIIB  . A-fib   . GERD (gastroesophageal reflux disease)   . Asthma   . Pacemaker 2011    [place] device/2011  . Hypothyroidism   . Depression   . Pneumonia     recurrent pneumonia sedf.  rad tx for lymphoma    Current Outpatient Prescriptions  Medication Sig Dispense Refill  . acetaminophen (TYLENOL) 500 MG tablet Take 500 mg by mouth as needed. 1 tab at bed time for back pain       . carvedilol (COREG) 25 MG tablet TAKE 1 TABLET (25 MG TOTAL) BY MOUTH 2 (TWO) TIMES DAILY WITH A MEAL.  180 tablet  2  . furosemide (LASIX) 40 MG tablet Take 40 mg by mouth daily.      Marland Kitchen gabapentin (NEURONTIN) 300 MG capsule Take 300 mg by mouth at bedtime.      Marland Kitchen levothyroxine (SYNTHROID, LEVOTHROID) 137 MCG tablet Take 137 mcg by mouth daily before breakfast.      . loratadine (CLARITIN) 10 MG tablet Take 10 mg by mouth daily.      . quinapril (ACCUPRIL) 10 MG tablet Take 10 mg by mouth 2 (two) times daily.       . RABEprazole (ACIPHEX) 20 MG tablet Take 1 tablet (20 mg total) by mouth 2 (two) times daily.  180 tablet  3   No current facility-administered medications for this encounter.    Filed Vitals:   05/01/14 1145  BP: 90/58  Pulse: 66  Weight: 215 lb 4 oz (97.637 kg)  SpO2: 94%    PHYSICAL EXAM: General:  Well appearing. No resp difficulty  HEENT: normal Neck: supple. JVP ~ 5- 6 . Carotids 2+ bilaterally; no bruits. No lymphadenopathy or thryomegaly appreciated. Cor: PMI normal. Regular rate & rhythm. No rubs, gallops or murmurs. Lungs: clear Abdomen: soft, nontender, nondistended. No hepatosplenomegaly. No bruits or masses. Good bowel sounds. Extremities: no cyanosis, clubbing, rash, edema Neuro: alert & orientedx3, cranial nerves grossly intact. Moves all 4 extremities w/o difficulty. Affect pleasant.   ASSESSMENT & PLAN:  1. Chronic systolic CHF: NICM, EF 123456 (08/2013), NICM thought to be from chemo (adriamycin) S/P CRT-D     --CPX 2/15: Peak VO2: 22.5 (64.6% predicted peak VO2). VE/VCO2 slope: 26.3 2. History of lymphoma - treated ( Age 61-23)  with adriamycin 3. Hypothyroid- On synthroid. Per PCP Dr Glenn Gonzalez.   Continues to do well though struggles with fatigue. Likely NYHA II-III based mostly on post-exercise fatigue and recovery time. CPX reassuring. Volume status looks good. Currently taking 40mg  lasix per day. BP too low to titrate meds. Does not want digoxin. Intolerant of spiro/eplerenon due to gynecomastia. We discussed CardioMems device. He is not interested at this point. Thyroid followed by Dr. Osborne Gonzalez. Dose recently lowered. We will f/u TFTs, Interested in stem cells when they are available.    Glenn Pascal Bensimhon,MD 12:48 PM

## 2014-05-03 ENCOUNTER — Encounter: Payer: Self-pay | Admitting: Cardiology

## 2014-07-05 ENCOUNTER — Encounter: Payer: Self-pay | Admitting: *Deleted

## 2014-08-03 ENCOUNTER — Encounter (HOSPITAL_COMMUNITY): Payer: Self-pay | Admitting: Vascular Surgery

## 2014-08-09 ENCOUNTER — Telehealth (HOSPITAL_COMMUNITY): Payer: Self-pay | Admitting: Vascular Surgery

## 2014-08-09 NOTE — Telephone Encounter (Signed)
Spoke w/pt, he states he felt extremely fatigued today, states wt is up only a lb or 2, denies SOB but states this is the way he feels before he gets sick, appt sch for tomorrow at 1:40

## 2014-08-09 NOTE — Telephone Encounter (Signed)
Pt left message pt is feeling a high level of Fatigue he would like to be seen tomorrow if possible.. Please advise

## 2014-08-10 ENCOUNTER — Ambulatory Visit (HOSPITAL_COMMUNITY)
Admission: RE | Admit: 2014-08-10 | Discharge: 2014-08-10 | Disposition: A | Discharge status: Home / Self Care | Payer: Managed Care, Other (non HMO) | Source: Ambulatory Visit | Attending: Internal Medicine | Admitting: Internal Medicine

## 2014-08-10 VITALS — BP 100/68 | HR 77 | Wt 212.2 lb

## 2014-08-10 DIAGNOSIS — R5383 Other fatigue: Secondary | ICD-10-CM | POA: Insufficient documentation

## 2014-08-10 DIAGNOSIS — E039 Hypothyroidism, unspecified: Secondary | ICD-10-CM | POA: Insufficient documentation

## 2014-08-10 DIAGNOSIS — Z87898 Personal history of other specified conditions: Secondary | ICD-10-CM | POA: Diagnosis not present

## 2014-08-10 DIAGNOSIS — Z950 Presence of cardiac pacemaker: Secondary | ICD-10-CM | POA: Insufficient documentation

## 2014-08-10 DIAGNOSIS — I509 Heart failure, unspecified: Secondary | ICD-10-CM | POA: Insufficient documentation

## 2014-08-10 DIAGNOSIS — R5381 Other malaise: Secondary | ICD-10-CM | POA: Diagnosis not present

## 2014-08-10 DIAGNOSIS — I5022 Chronic systolic (congestive) heart failure: Secondary | ICD-10-CM | POA: Diagnosis present

## 2014-08-10 LAB — COMPREHENSIVE METABOLIC PANEL
ALBUMIN: 3.8 g/dL (ref 3.5–5.2)
ALK PHOS: 60 U/L (ref 39–117)
ALT: 13 U/L (ref 0–53)
AST: 24 U/L (ref 0–37)
Anion gap: 15 (ref 5–15)
BUN: 24 mg/dL — ABNORMAL HIGH (ref 6–23)
CO2: 24 mEq/L (ref 19–32)
Calcium: 8.9 mg/dL (ref 8.4–10.5)
Chloride: 99 mEq/L (ref 96–112)
Creatinine, Ser: 1.18 mg/dL (ref 0.50–1.35)
GFR calc non Af Amer: 69 mL/min — ABNORMAL LOW (ref >90)
GFR, EST AFRICAN AMERICAN: 80 mL/min — AB (ref >90)
GLUCOSE: 106 mg/dL — AB (ref 70–99)
POTASSIUM: 4.9 meq/L (ref 3.7–5.3)
SODIUM: 138 meq/L (ref 137–147)
TOTAL PROTEIN: 6.7 g/dL (ref 6.0–8.3)
Total Bilirubin: 0.5 mg/dL (ref 0.3–1.2)

## 2014-08-10 LAB — CBC
HCT: 46.9 % (ref 39.0–52.0)
HEMOGLOBIN: 15.7 g/dL (ref 13.0–17.0)
MCH: 31.5 pg (ref 26.0–34.0)
MCHC: 33.5 g/dL (ref 30.0–36.0)
MCV: 94 fL (ref 78.0–100.0)
Platelets: 218 10*3/uL (ref 150–400)
RBC: 4.99 MIL/uL (ref 4.22–5.81)
RDW: 14.9 % (ref 11.5–15.5)
WBC: 8 10*3/uL (ref 4.0–10.5)

## 2014-08-10 LAB — TSH: TSH: 1.47 u[IU]/mL (ref 0.350–4.500)

## 2014-08-10 NOTE — Addendum Note (Signed)
Encounter addended by: Scarlette Calico, RN on: 08/10/2014  2:18 PM<BR>     Documentation filed: Patient Instructions Section, Orders

## 2014-08-10 NOTE — Patient Instructions (Signed)
Labs today  Your physician recommends that you schedule a follow-up appointment in: 3 months  

## 2014-08-10 NOTE — Progress Notes (Signed)
Patient ID: Glenn Gonzalez, male   DOB: [birth year], 53 y.o.   MRN: [id number]  PCP: Dr Glenn Gonzalez  HPI: Glenn Gonzalez is a 53 y/o male with long h/o CHF due to NICM (? related to chemo/XRT) with EF 25% s/p CRT-D [facility])  PMHx notable for Hodgkins lymphona (treated age 46-23) Treated with high dose chemo - including adriamycin and XRT. Also has AF, VT, PUD and asthma. Initial EF 13%. Previously seen at [facility] transplant clinic and felt to be doing too well to require Tx. Cath with normal coronaries. Has not been able to tolerate spironolactone or Inspra due to painful gynecomastia requiring surgery. Stopped statin due to myalgias.  07/02/2011 ECHO 25% 05/2012 ECHO EF 25%  10/2012 Dr [name] reprogrammed BiV device due to PMT NSVT 08/12/13 EF 20-25%  CPX 04/2011:  pVO2 17.3 (59% predicted) VeVCo2 slope 38.4 RER 1.14  CPX 02/28/13  PVO2 19 (60% predicted) VEVCO2 slope 31. RER1.16  CPX 01/04/14 FVC 3.70 (67%)  FEV1 2.96 (69%)  Resting HR: 71 Peak HR: 101 (60.1% age predicted max HR) BP rest: 92/60 BP peak: 110/58 Peak VO2: 22.5 (64.6% predicted peak VO2) VE/VCO2 slope: 26.3 Peak RER: 1.17 Ventilatory Threshold: 15.6 (44.8% predicted peak VO2)   Labs  02/04/13 224  01/25/13 Pro BNP 2660 08/31/13 K 4.1 Creatinine 0.93 Pro BNP 1929  10/05/13 K 5.0 Creatinine 1.7 Pro BNP 250 01/09/14  K 4.2 Cr 1.14  Follow up for HF: Doing fairly well until Monday night. On Monday night couldn't sleep and felt warm - felt something was wrong with his body. Says it was similar to how he felt when he had cancer. Then on Tuesday felt tired. On Wednesday, profound fatigue. Today nearly back to baseline. Able to exercise today without problem. Slowly losing weight due to low-salt diet. Taking lasix 40mg  daily. Occasional dizziness. No fevers, chills, night sweats.    ROS: All systems negative except as listed in HPI, PMH and Problem List.  Past Medical History  Diagnosis Date  . CHF (congestive heart failure)   . Hodgkin's  lymphoma 1980    IIIB  . A-fib   . GERD (gastroesophageal reflux disease)   . Asthma   . Pacemaker 2011    [place] device/2011  . Hypothyroidism   . Depression   . Pneumonia     recurrent pneumonia sedf.  rad tx for lymphoma    Current Outpatient Prescriptions  Medication Sig Dispense Refill  . acetaminophen (TYLENOL) 500 MG tablet Take 500 mg by mouth. 1 tab at bed time for back pain      . carvedilol (COREG) 25 MG tablet TAKE 1 TABLET (25 MG TOTAL) BY MOUTH 2 (TWO) TIMES DAILY WITH A MEAL.  180 tablet  2  . cetirizine (ZYRTEC) 10 MG tablet Take 10 mg by mouth daily.      . furosemide (LASIX) 40 MG tablet Take 40 mg by mouth daily.      Marland Kitchen gabapentin (NEURONTIN) 300 MG capsule Take 300 mg by mouth at bedtime.      Marland Kitchen levothyroxine (SYNTHROID, LEVOTHROID) 137 MCG tablet Take 137 mcg by mouth daily before breakfast.      . quinapril (ACCUPRIL) 10 MG tablet Take 10 mg by mouth 2 (two) times daily.       . RABEprazole (ACIPHEX) 20 MG tablet Take 1 tablet (20 mg total) by mouth 2 (two) times daily.  180 tablet  3   No current facility-administered medications for this encounter.  Filed Vitals:   08/10/14 1338  BP: 100/68  Pulse: 77  Weight: 212 lb 4 oz (96.276 kg)  SpO2: 96%    PHYSICAL EXAM: General:  Well appearing. No resp difficulty  HEENT: normal Neck: supple. JVP ~ 5- 6 . Carotids 2+ bilaterally; no bruits. No lymphadenopathy or thryomegaly appreciated. Cor: PMI normal. Regular rate & rhythm. No rubs, gallops or murmurs. Lungs: clear Abdomen: soft, nontender, nondistended. No hepatosplenomegaly. No bruits or masses. Good bowel sounds. Extremities: no cyanosis, clubbing, rash, edema Neuro: alert & orientedx3, cranial nerves grossly intact. Moves all 4 extremities w/o difficulty. Affect pleasant.   ASSESSMENT & PLAN:  1. Chronic systolic CHF: NICM, EF 123456 (08/2013), NICM thought to be from chemo (adriamycin) S/P CRT-D    --CPX 2/15: Peak VO2:  22.5 (64.6% predicted peak VO2). VE/VCO2 slope: 26.3 2. History of lymphoma - treated ( Age 6-23)  with adriamycin 3. Hypothyroid- On synthroid. Per PCP Dr Glenn Gonzalez.  4. Fatigue  Continues to do well though struggles with fatigue. Likely NYHA II-III based mostly on post-exercise fatigue and recovery time. CPX reassuring. Volume status looks good. I am unable to explain his several day bout of profound fatigue. Will get blood work and also interrogate ICD to exclude underlying arrhythmias. Will continue current med regimen. Intolerant of spiro/eplerenone due to gynecomastia. BP too low for Entresto. We discussed Corlanor but I am concerned that HR lowering may compromise his exercise ability.    Daniel Bensimhon,MD 2:00 PM

## 2014-08-16 ENCOUNTER — Encounter: Payer: Self-pay | Admitting: Internal Medicine

## 2014-09-07 ENCOUNTER — Encounter: Payer: Self-pay | Admitting: Internal Medicine

## 2014-09-07 ENCOUNTER — Ambulatory Visit (INDEPENDENT_AMBULATORY_CARE_PROVIDER_SITE_OTHER): Payer: Managed Care, Other (non HMO) | Admitting: Internal Medicine

## 2014-09-07 VITALS — BP 102/70 | HR 75 | Ht 73.5 in | Wt 208.4 lb

## 2014-09-07 DIAGNOSIS — I5022 Chronic systolic (congestive) heart failure: Secondary | ICD-10-CM

## 2014-09-07 DIAGNOSIS — I429 Cardiomyopathy, unspecified: Secondary | ICD-10-CM

## 2014-09-07 DIAGNOSIS — I428 Other cardiomyopathies: Secondary | ICD-10-CM

## 2014-09-07 DIAGNOSIS — Z95810 Presence of automatic (implantable) cardiac defibrillator: Secondary | ICD-10-CM

## 2014-09-07 DIAGNOSIS — I472 Ventricular tachycardia: Secondary | ICD-10-CM

## 2014-09-07 DIAGNOSIS — I4729 Other ventricular tachycardia: Secondary | ICD-10-CM

## 2014-09-07 LAB — MDC_IDC_ENUM_SESS_TYPE_INCLINIC
Brady Statistic RA Percent Paced: 0 %
Brady Statistic RV Percent Paced: 99 %
HighPow Impedance: 54 Ohm
Lead Channel Impedance Value: 445 Ohm
Lead Channel Impedance Value: 556 Ohm
Lead Channel Impedance Value: 590 Ohm
Lead Channel Pacing Threshold Amplitude: 0.7 V
Lead Channel Pacing Threshold Amplitude: 0.9 V
Lead Channel Pacing Threshold Pulse Width: 0.4 ms
Lead Channel Pacing Threshold Pulse Width: 0.8 ms
Lead Channel Sensing Intrinsic Amplitude: 11.4 mV
Lead Channel Sensing Intrinsic Amplitude: 2.7 mV
Lead Channel Sensing Intrinsic Amplitude: 24.6 mV
Lead Channel Setting Pacing Amplitude: 1 V
Lead Channel Setting Pacing Amplitude: 2 V
Lead Channel Setting Pacing Amplitude: 2.1 V
Lead Channel Setting Pacing Pulse Width: 0.8 ms
Lead Channel Setting Sensing Sensitivity: 0.6 mV
MDC IDC MSMT BATTERY REMAINING LONGEVITY: 90 mo
MDC IDC MSMT BATTERY REMAINING PERCENTAGE: 100 %
MDC IDC MSMT LEADCHNL RA PACING THRESHOLD AMPLITUDE: 0.6 V
MDC IDC MSMT LEADCHNL RA PACING THRESHOLD PULSEWIDTH: 0.4 ms
MDC IDC PG SERIAL: 588967
MDC IDC SESS DTM: 20151008172237
MDC IDC SET LEADCHNL LV SENSING SENSITIVITY: 1 mV
MDC IDC SET LEADCHNL RV PACING PULSEWIDTH: 0.4 ms
MDC IDC SET ZONE DETECTION INTERVAL: 273 ms
Zone Setting Detection Interval: 300 ms
Zone Setting Detection Interval: 429 ms

## 2014-09-07 NOTE — Patient Instructions (Signed)
Your physician recommends that you continue on your current medications as directed. Please refer to the Current Medication list given to you today.  Your physician recommends that you schedule a follow-up appointment in: 3 months with Dr. [name].  Your physician wants you to follow-up in: 1 year with Dr. [name].  You will receive a reminder letter in the mail two months in advance. If you don't receive a letter, please call our office to schedule the follow-up appointment.

## 2014-09-07 NOTE — Progress Notes (Signed)
Patient Care Team: Haywood Pao, MD as PCP - General (Internal Medicine)   HPI  Glenn Gonzalez is a 53 y.o. 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 He is s/p CRT-D  He is followed by the heart failure clinic.   He had an episode a few weeks ago where after a meal that was more loaded with salt normal had anxiety/cardiac irritability for the night.  He has not had any intercurrent defibrillator discharges  Most recent laboratory not he had a creatinine of 1.18 potassium 4.9   Past Medical History  Diagnosis Date  . CHF (congestive heart failure)   . Hodgkin's lymphoma 1980    IIIB  . A-fib   . GERD (gastroesophageal reflux disease)   . Asthma   . Pacemaker 2011    [place] device/2011  . Hypothyroidism   . Depression   . Pneumonia     recurrent pneumonia sedf.  rad tx for lymphoma    Past Surgical History  Procedure Laterality Date  . Neck biopses      x5  . Cholecystectomy    . Exploratory laparotomy    . Permanent pacemaker  2011    [employer] COGNIS device  . Gynemastia    . Portacath placement    . Tonsillectomy      Current Outpatient Prescriptions  Medication Sig Dispense Refill  . acetaminophen (TYLENOL) 500 MG tablet Take 500 mg by mouth. 1 tab at bed time for back pain      . carvedilol (COREG) 25 MG tablet TAKE 1 TABLET (25 MG TOTAL) BY MOUTH 2 (TWO) TIMES DAILY WITH A MEAL.  180 tablet  2  . cetirizine (ZYRTEC) 10 MG tablet Take 10 mg by mouth daily.      . furosemide (LASIX) 40 MG tablet Take 40 mg by mouth daily.      Marland Kitchen gabapentin (NEURONTIN) 300 MG capsule Take 300 mg by mouth at bedtime.      Marland Kitchen levothyroxine (SYNTHROID, LEVOTHROID) 137 MCG tablet Take 137 mcg by mouth daily before breakfast.      . quinapril (ACCUPRIL) 10 MG tablet Take 10 mg by mouth  2 (two) times daily.       . RABEprazole (ACIPHEX) 20 MG tablet Take 1 tablet (20 mg total) by mouth 2 (two) times daily.  180 tablet  3   No current facility-administered medications for this visit.    Allergies  Allergen Reactions  . Digoxin Other (See Comments)    gynecomastia   . Milk-Related Compounds     Cramps and diarrhea   . Penicillins     Cramps   . Spironolactone Other (See Comments)    Other Reaction: gynecomastia    Review of Systems negative except from HPI and PMH  Physical Exam BP 102/70  Pulse 75  Ht 6' 1.5" (1.867 m)  Wt 208 lb 6.4 oz (94.53 kg)  BMI 27.12 kg/m2 Well developed and well nourished in no acute distress  HENT normal Neck supple with JVP-flat Clear Regular rate and rhythm, no murmurs or gallops Abd-soft with active BS No Clubbing cyanosis edema Skin-warm and dry A & Oriented  Grossly normal sensory and motor function  His ECG demonstrates sinus rhythm with a synchronous pacing with a biventricular pacing pattern  Assessment and  Plan Nonischemic cardio myopathy  Congestive heart failure  chronic-systolic  Ventricular tachycardia  Implantable defibrillator-[employer]  The patient's device was interrogated and the information was fully reviewed.  The device was reprogrammed as noted below  Atrial tachycardia  Overall he has been doing quite well. The episode of discombobulated she was associated with a "anxiety" sensation he well be related to atrial tachycardia. We will reprogram his device to lower his HR detection rate from 150--130 to see if there is any correlation.  No intercurrent ventricular tachycardia

## 2014-09-15 ENCOUNTER — Other Ambulatory Visit: Payer: Self-pay

## 2014-09-19 ENCOUNTER — Ambulatory Visit (HOSPITAL_COMMUNITY)
Admission: RE | Admit: 2014-09-19 | Discharge: 2014-09-19 | Disposition: A | Discharge status: Home / Self Care | Payer: Managed Care, Other (non HMO) | Source: Ambulatory Visit | Attending: Internal Medicine | Admitting: Internal Medicine

## 2014-09-19 VITALS — BP 84/52 | HR 70 | Wt 208.8 lb

## 2014-09-19 DIAGNOSIS — K279 Peptic ulcer, site unspecified, unspecified as acute or chronic, without hemorrhage or perforation: Secondary | ICD-10-CM | POA: Insufficient documentation

## 2014-09-19 DIAGNOSIS — I429 Cardiomyopathy, unspecified: Secondary | ICD-10-CM

## 2014-09-19 DIAGNOSIS — I4891 Unspecified atrial fibrillation: Secondary | ICD-10-CM | POA: Insufficient documentation

## 2014-09-19 DIAGNOSIS — Z95810 Presence of automatic (implantable) cardiac defibrillator: Secondary | ICD-10-CM | POA: Insufficient documentation

## 2014-09-19 DIAGNOSIS — Z923 Personal history of irradiation: Secondary | ICD-10-CM | POA: Insufficient documentation

## 2014-09-19 DIAGNOSIS — I471 Supraventricular tachycardia: Secondary | ICD-10-CM | POA: Diagnosis not present

## 2014-09-19 DIAGNOSIS — I428 Other cardiomyopathies: Secondary | ICD-10-CM | POA: Diagnosis not present

## 2014-09-19 DIAGNOSIS — E039 Hypothyroidism, unspecified: Secondary | ICD-10-CM | POA: Diagnosis not present

## 2014-09-19 DIAGNOSIS — J45909 Unspecified asthma, uncomplicated: Secondary | ICD-10-CM | POA: Diagnosis not present

## 2014-09-19 DIAGNOSIS — I472 Ventricular tachycardia: Secondary | ICD-10-CM | POA: Insufficient documentation

## 2014-09-19 DIAGNOSIS — Z9221 Personal history of antineoplastic chemotherapy: Secondary | ICD-10-CM | POA: Insufficient documentation

## 2014-09-19 DIAGNOSIS — K219 Gastro-esophageal reflux disease without esophagitis: Secondary | ICD-10-CM | POA: Diagnosis not present

## 2014-09-19 DIAGNOSIS — I5022 Chronic systolic (congestive) heart failure: Secondary | ICD-10-CM

## 2014-09-19 DIAGNOSIS — N62 Hypertrophy of breast: Secondary | ICD-10-CM | POA: Insufficient documentation

## 2014-09-19 DIAGNOSIS — Z8571 Personal history of Hodgkin lymphoma: Secondary | ICD-10-CM | POA: Diagnosis not present

## 2014-09-19 NOTE — Progress Notes (Signed)
Patient ID: Glenn Gonzalez, male   DOB: 07/27/1961, 53 y.o.   MRN: [id number] PCP: Dr Osborne Casco  HPI: Glenn Gonzalez is a 53 y/o male with long h/o CHF due to NICM (? related to chemo/XRT) with EF 25% s/p CRT-D [facility])  PMHx notable for Hodgkins lymphona (treated age 33-23) Treated with high dose chemo - including adriamycin and XRT. Also has AF, VT, PUD and asthma. Initial EF 13%. Previously seen at [facility] transplant clinic and felt to be doing too well to require Tx. Cath with normal coronaries. Has not been able to tolerate spironolactone or Inspra due to painful gynecomastia requiring surgery. Stopped statin due to myalgias.  07/02/2011 ECHO 25% 05/2012 ECHO EF 25%  10/2012 Dr [name] reprogrammed BiV device due to PMT NSVT 08/12/13 EF 20-25%  CPX 04/2011:  pVO2 17.3 (59% predicted) VeVCo2 slope 38.4 RER 1.14  CPX 02/28/13  PVO2 19 (60% predicted) VEVCO2 slope 31. RER1.16  CPX 01/04/14 FVC 3.70 (67%)  FEV1 2.96 (69%)  Resting HR: 71 Peak HR: 101 (60.1% age predicted max HR) BP rest: 92/60 BP peak: 110/58 Peak VO2: 22.5 (64.6% predicted peak VO2) VE/VCO2 slope: 26.3 Peak RER: 1.17 Ventilatory Threshold: 15.6 (44.8% predicted peak VO2)  Labs  02/04/13 224  01/25/13 Pro BNP 2660 08/31/13 K 4.1 Creatinine 0.93 Pro BNP 1929  10/05/13 K 5.0 Creatinine 1.7 Pro BNP 250 01/09/14  K 4.2 Cr 1.14 9/15 K 4.9, creatinine 1.18, HCT 46.9, TSH normal  Follow up for HF: Prior to last appointment, had an episode of profound fatigue.  By device interrogation, this may have been an episode of atrial tachycardia.  It may have been triggered by volume overload from a sodium load the night prior.  Since then, he has watched his sodium intake very assiduously and has been doing well.  He feels good overall.  No exertional dyspnea.  Works out on elliptical most days. Can climb 1 flight of steps without problems.  No orthopnea or PND. No chest pain.  Weight is down 4 lbs.    ROS: All systems negative except as listed in  HPI, PMH and Problem List.  Past Medical History  Diagnosis Date  . CHF (congestive heart failure)   . Hodgkin's lymphoma 1980    IIIB  . A-fib   . GERD (gastroesophageal reflux disease)   . Asthma   . Pacemaker 2011    [place] device/2011  . Hypothyroidism   . Depression   . Pneumonia     recurrent pneumonia sedf.  rad tx for lymphoma    Current Outpatient Prescriptions  Medication Sig Dispense Refill  . acetaminophen (TYLENOL) 500 MG tablet Take 500 mg by mouth. 1 tab at bed time for back pain      . carvedilol (COREG) 25 MG tablet TAKE 1 TABLET (25 MG TOTAL) BY MOUTH 2 (TWO) TIMES DAILY WITH A MEAL.  180 tablet  2  . cetirizine (ZYRTEC) 10 MG tablet Take 10 mg by mouth daily.      . furosemide (LASIX) 40 MG tablet Take 40 mg by mouth daily.      Marland Kitchen gabapentin (NEURONTIN) 300 MG capsule Take 300 mg by mouth at bedtime.      Marland Kitchen levothyroxine (SYNTHROID, LEVOTHROID) 137 MCG tablet Take 137 mcg by mouth daily before breakfast.      . quinapril (ACCUPRIL) 10 MG tablet Take 10 mg by mouth 2 (two) times daily.       . RABEprazole (ACIPHEX) 20  MG tablet Take 1 tablet (20 mg total) by mouth 2 (two) times daily.  180 tablet  3   No current facility-administered medications for this encounter.    Filed Vitals:   09/19/14 1029  BP: 84/52  Pulse: 70  Weight: 208 lb 12 oz (94.688 kg)  SpO2: 96%    PHYSICAL EXAM: General:  Well appearing. No resp difficulty  HEENT: normal Neck: supple. JVP ~ 5- 6 . Carotids 2+ bilaterally; no bruits. No lymphadenopathy or thryomegaly appreciated. Cor: PMI normal. Regular rate & rhythm. No rubs, gallops or murmurs. Lungs: clear Abdomen: soft, nontender, nondistended. No hepatosplenomegaly. No bruits or masses. Good bowel sounds. Extremities: no cyanosis, clubbing, rash, edema Neuro: alert & orientedx3, cranial nerves grossly intact. Moves all 4 extremities w/o difficulty. Affect pleasant.   ASSESSMENT & PLAN:  1. Chronic  systolic CHF: NICM, EF 123456 (08/2013), thought to be from chemo (adriamycin), s/p Medtronic CRT-D. CPX 2/15 Peak VO2: 22.5 (64.6% predicted peak VO2). VE/VCO2 slope: 26.3 (reassuring). NYHA class II.  BP low at times (including today) but denies orthostatic symptoms.  - Continue current Coreg and quinapril.  I would eventually like to get him over to [facility] today is probably not the day with low BP.  - Continue current Lasix, he looks euvolemic.  - He will check his BP daily and record.  I will have him followup in 2 months and will see if we can get him on Entresto at that time.  - Would hold off on Corlanor for now given concern that lower heart rate may make exercise tolerance worse.  - No spironolactone or eplerenone due to gynecomastia. 2. History of lymphoma treated ( Age 60-23)  with adriamycin 3. Hypothyroid: On synthroid. Per PCP Dr Osborne Casco.  4. Atrial tachycardia: Episodes of profound fatigue prior to last appointment may have been due to run of atrial tachycardia in the setting of volume overload.  This was found on ICD interrogation by Dr [name].  Detection threshold for tachyarrhythmias decreased to 130 bpm to make detection of AT episodes easier.   Loralie Champagne 09/19/2014

## 2014-09-19 NOTE — Patient Instructions (Signed)
Your physician has requested that you regularly monitor and record your blood pressure readings at home. Please use the same machine at the same time of day to check your readings and record them to bring to your follow-up visit.  Your physician recommends that you schedule a follow-up appointment in: 2 months

## 2014-11-21 ENCOUNTER — Ambulatory Visit (HOSPITAL_COMMUNITY)
Admission: RE | Admit: 2014-11-21 | Discharge: 2014-11-21 | Disposition: A | Discharge status: Home / Self Care | Payer: Managed Care, Other (non HMO) | Source: Ambulatory Visit | Attending: Internal Medicine | Admitting: Internal Medicine

## 2014-11-21 VITALS — BP 84/52 | HR 80 | Wt 206.1 lb

## 2014-11-21 DIAGNOSIS — J45909 Unspecified asthma, uncomplicated: Secondary | ICD-10-CM | POA: Diagnosis not present

## 2014-11-21 DIAGNOSIS — E039 Hypothyroidism, unspecified: Secondary | ICD-10-CM | POA: Insufficient documentation

## 2014-11-21 DIAGNOSIS — I4891 Unspecified atrial fibrillation: Secondary | ICD-10-CM | POA: Insufficient documentation

## 2014-11-21 DIAGNOSIS — Z950 Presence of cardiac pacemaker: Secondary | ICD-10-CM | POA: Insufficient documentation

## 2014-11-21 DIAGNOSIS — F329 Major depressive disorder, single episode, unspecified: Secondary | ICD-10-CM | POA: Diagnosis not present

## 2014-11-21 DIAGNOSIS — N62 Hypertrophy of breast: Secondary | ICD-10-CM | POA: Insufficient documentation

## 2014-11-21 DIAGNOSIS — Z79899 Other long term (current) drug therapy: Secondary | ICD-10-CM | POA: Diagnosis not present

## 2014-11-21 DIAGNOSIS — K279 Peptic ulcer, site unspecified, unspecified as acute or chronic, without hemorrhage or perforation: Secondary | ICD-10-CM | POA: Diagnosis not present

## 2014-11-21 DIAGNOSIS — K219 Gastro-esophageal reflux disease without esophagitis: Secondary | ICD-10-CM | POA: Insufficient documentation

## 2014-11-21 DIAGNOSIS — I472 Ventricular tachycardia: Secondary | ICD-10-CM | POA: Diagnosis not present

## 2014-11-21 DIAGNOSIS — I471 Supraventricular tachycardia: Secondary | ICD-10-CM | POA: Diagnosis not present

## 2014-11-21 DIAGNOSIS — Z8571 Personal history of Hodgkin lymphoma: Secondary | ICD-10-CM | POA: Insufficient documentation

## 2014-11-21 DIAGNOSIS — I5022 Chronic systolic (congestive) heart failure: Secondary | ICD-10-CM | POA: Insufficient documentation

## 2014-11-21 NOTE — Addendum Note (Signed)
Encounter addended by: Scarlette Calico, RN on: 11/21/2014 11:57 AM<BR>     Documentation filed: Patient Instructions Section

## 2014-11-21 NOTE — Progress Notes (Signed)
Patient ID: HEKTOR ANTHES, male   DOB: Aug 12, 1961, 53 y.o.   MRN: [id number] PCP: Dr Osborne Casco  HPI: Ed is a 53 y/o male with long h/o CHF due to NICM (? related to chemo/XRT) with EF 25% s/p CRT-D [facility])  PMHx notable for Hodgkins lymphona (treated age 24-23) Treated with high dose chemo - including adriamycin and XRT. Also has AF, VT, PUD and asthma. Initial EF 13%. Previously seen at [facility] transplant clinic and felt to be doing too well to require Tx. Cath with normal coronaries. Has not been able to tolerate spironolactone or Inspra due to painful gynecomastia requiring surgery. Stopped statin due to myalgias.  07/02/2011 ECHO 25% 05/2012 ECHO EF 25%  10/2012 Dr [name] reprogrammed BiV device due to PMT NSVT 08/12/13 EF 20-25%  CPX 04/2011:  pVO2 17.3 (59% predicted) VeVCo2 slope 38.4 RER 1.14  CPX 02/28/13  PVO2 19 (60% predicted) VEVCO2 slope 31. RER1.16  CPX 01/04/14 FVC 3.70 (67%)  FEV1 2.96 (69%)  Resting HR: 71 Peak HR: 101 (60.1% age predicted max HR) BP rest: 92/60 BP peak: 110/58 Peak VO2: 22.5 (64.6% predicted peak VO2) VE/VCO2 slope: 26.3 Peak RER: 1.17 Ventilatory Threshold: 15.6 (44.8% predicted peak VO2)  Labs  02/04/13 224  01/25/13 Pro BNP 2660 08/31/13 K 4.1 Creatinine 0.93 Pro BNP 1929  10/05/13 K 5.0 Creatinine 1.7 Pro BNP 250 01/09/14  K 4.2 Cr 1.14 9/15 K 4.9, creatinine 1.18, HCT 46.9, TSH normal  Follow up for HF: Prior to last appointment, had an episode of profound fatigue.  By device interrogation, this may have been an episode of atrial tachycardia.  It may have been triggered by volume overload from a sodium load the night prior.  Since then, he has watched his sodium intake very assiduously and has been doing well.  He feels good overall.  No exertional dyspnea.  Works out on elliptical most days. Still losing weight. Weight at home 196. No orthopnea or PND. No chest pain.  No problems with meds.  ROS: All systems negative except as listed in HPI, PMH  and Problem List.  Past Medical History  Diagnosis Date  . CHF (congestive heart failure)   . Hodgkin's lymphoma 1980    IIIB  . A-fib   . GERD (gastroesophageal reflux disease)   . Asthma   . Pacemaker 2011    [place] device/2011  . Hypothyroidism   . Depression   . Pneumonia     recurrent pneumonia sedf.  rad tx for lymphoma    Current Outpatient Prescriptions  Medication Sig Dispense Refill  . acetaminophen (TYLENOL) 500 MG tablet Take 500 mg by mouth. 1 tab at bed time for back pain    . carvedilol (COREG) 25 MG tablet TAKE 1 TABLET (25 MG TOTAL) BY MOUTH 2 (TWO) TIMES DAILY WITH A MEAL. 180 tablet 2  . cetirizine (ZYRTEC) 10 MG tablet Take 10 mg by mouth daily.    . furosemide (LASIX) 40 MG tablet Take 40 mg by mouth daily.    Marland Kitchen gabapentin (NEURONTIN) 300 MG capsule Take 300 mg by mouth at bedtime.    Marland Kitchen levothyroxine (SYNTHROID, LEVOTHROID) 137 MCG tablet Take 137 mcg by mouth daily before breakfast.    . quinapril (ACCUPRIL) 10 MG tablet Take 10 mg by mouth 2 (two) times daily.     . RABEprazole (ACIPHEX) 20 MG tablet Take 1 tablet (20 mg total) by mouth 2 (two) times daily. 180 tablet 3  No current facility-administered medications for this encounter.    Filed Vitals:   11/21/14 1107  BP: 84/52  Pulse: 80  Weight: 206 lb 1.9 oz (93.495 kg)  SpO2: 95%    PHYSICAL EXAM: General:  Well appearing. No resp difficulty  HEENT: normal Neck: supple. JVP ~ 5- 6 . Carotids 2+ bilaterally; no bruits. No lymphadenopathy or thryomegaly appreciated. Cor: PMI normal. Regular rate & rhythm. No rubs, gallops or murmurs. Lungs: clear Abdomen: soft, nontender, nondistended. No hepatosplenomegaly. No bruits or masses. Good bowel sounds. Extremities: no cyanosis, clubbing, rash, edema Neuro: alert & orientedx3, cranial nerves grossly intact. Moves all 4 extremities w/o difficulty. Affect pleasant.   ASSESSMENT & PLAN:  1. Chronic systolic CHF: NICM, EF  123456 (08/2013), thought to be from chemo (adriamycin), s/p Medtronic CRT-D. CPX 2/15 Peak VO2: 22.5 (64.6% predicted peak VO2). VE/VCO2 slope: 26.3 (reassuring). NYHA class II.  BP low at times (including today) but denies orthostatic symptoms.  - Doing well. Little change.  - Continue current Coreg and quinapril. BP too low for Entresto or to titrate other meds. .  - Continue current Lasix, he looks euvolemic.  - Could consider Corlanor but we have held off due to concern over decreased HR with exercise. We discussed this again today and he would like to wait until Spring to try.  - No spironolactone or eplerenone due to gynecomastia. - Consider yearly CPX  2. History of lymphoma treated ( Age 82-23)  with adriamycin 3. Hypothyroid: On synthroid. Per PCP Dr Osborne Casco.  4. Atrial tachycardia: Episodes of profound fatigue prior to last appointment may have been due to run of atrial tachycardia in the setting of volume overload.  This was found on ICD interrogation by Dr [name].  Detection threshold for tachyarrhythmias decreased to 130 bpm to make detection of AT episodes easier.   [name] MD 11/21/2014

## 2014-11-21 NOTE — Patient Instructions (Signed)
Your physician has recommended that you have a cardiopulmonary stress test (CPX). CPX testing is a non-invasive measurement of heart and lung function. It replaces a traditional treadmill stress test. This type of test provides a tremendous amount of information that relates not only to your present condition but also for future outcomes. This test combines measurements of you ventilation, respiratory gas exchange in the lungs, electrocardiogram (EKG), blood pressure and physical response before, during, and following an exercise protocol.  IN 3 MONTHS  We will contact you in 3 months to schedule your next appointment.

## 2014-12-01 ENCOUNTER — Other Ambulatory Visit (HOSPITAL_COMMUNITY): Payer: Self-pay | Admitting: Adult Health

## 2014-12-15 ENCOUNTER — Other Ambulatory Visit: Payer: Self-pay

## 2014-12-19 ENCOUNTER — Telehealth (HOSPITAL_COMMUNITY): Payer: Self-pay | Admitting: Vascular Surgery

## 2014-12-19 NOTE — Telephone Encounter (Signed)
Spoke w/pt, he states he gained 4 lbs overnight, he is a little SOB and states his abd is swollen, he has just taken an extra 60 mg of Lasix but states it hasn't started working yet, advised to give med time to work, watch salt and fluid intake and he will call back in AM with update on his weight

## 2014-12-19 NOTE — Telephone Encounter (Signed)
Pt called he gained 4 lbs in one day.. Please advise

## 2014-12-29 ENCOUNTER — Telehealth (HOSPITAL_COMMUNITY): Payer: Self-pay | Admitting: Vascular Surgery

## 2014-12-29 NOTE — Telephone Encounter (Signed)
Pt called he is sob and holding water and some bad arrhythmias he feels like he needs to be seen today  ... Please advise

## 2014-12-29 NOTE — Telephone Encounter (Signed)
Discussed w/Dr Bensimhon via phone, he recommends pt take an extra 40 mg of Lasix again today and sch f/u for next week, if not feeling better can go to ER for eval, pt is aware and agreeable appt sch for Tue at 2:40

## 2014-12-29 NOTE — Telephone Encounter (Signed)
Spoke w/pt, he states he has been having trouble with weight going up and down, only changing by 2-4 lbs either way, he has occasionally this week taken an extra 40 mg of Lasix which he states has not really helped, he states his abd is distended but does seem to be a little better than it was earlier this week.  He states when he lays down he gets very SOB and usually awakens gasping for breathe, so he has not really been sleeping all week, he also states the he feels his heart jumping around, this occurs mostly when he lays down but does feel it during the day as well.  Advised Dr Haroldine Laws is not in clinic today, will discuss w/provider and call him back

## 2015-01-02 ENCOUNTER — Encounter (HOSPITAL_COMMUNITY): Payer: Self-pay

## 2015-01-02 ENCOUNTER — Ambulatory Visit (HOSPITAL_COMMUNITY)
Admission: RE | Admit: 2015-01-02 | Discharge: 2015-01-02 | Disposition: A | Discharge status: Home / Self Care | Payer: Managed Care, Other (non HMO) | Source: Ambulatory Visit | Attending: Internal Medicine | Admitting: Internal Medicine

## 2015-01-02 VITALS — BP 88/64 | HR 63 | Wt 206.4 lb

## 2015-01-02 DIAGNOSIS — Z950 Presence of cardiac pacemaker: Secondary | ICD-10-CM | POA: Diagnosis not present

## 2015-01-02 DIAGNOSIS — I4891 Unspecified atrial fibrillation: Secondary | ICD-10-CM | POA: Insufficient documentation

## 2015-01-02 DIAGNOSIS — F329 Major depressive disorder, single episode, unspecified: Secondary | ICD-10-CM | POA: Insufficient documentation

## 2015-01-02 DIAGNOSIS — I5022 Chronic systolic (congestive) heart failure: Secondary | ICD-10-CM | POA: Insufficient documentation

## 2015-01-02 DIAGNOSIS — R0600 Dyspnea, unspecified: Secondary | ICD-10-CM

## 2015-01-02 DIAGNOSIS — K219 Gastro-esophageal reflux disease without esophagitis: Secondary | ICD-10-CM | POA: Diagnosis not present

## 2015-01-02 DIAGNOSIS — Z8572 Personal history of non-Hodgkin lymphomas: Secondary | ICD-10-CM | POA: Diagnosis not present

## 2015-01-02 DIAGNOSIS — I471 Supraventricular tachycardia: Secondary | ICD-10-CM | POA: Insufficient documentation

## 2015-01-02 DIAGNOSIS — E039 Hypothyroidism, unspecified: Secondary | ICD-10-CM | POA: Insufficient documentation

## 2015-01-02 DIAGNOSIS — Z79899 Other long term (current) drug therapy: Secondary | ICD-10-CM | POA: Insufficient documentation

## 2015-01-02 MED ORDER — FUROSEMIDE 40 MG PO TABS: 60.0000 mg | ORAL_TABLET | Freq: Every day | ORAL | Status: DC

## 2015-01-02 NOTE — Addendum Note (Signed)
Encounter addended by: Jolaine Artist, MD on: 01/02/2015  3:14 PM<BR>     Documentation filed: Notes Section

## 2015-01-02 NOTE — Progress Notes (Addendum)
Patient ID: Glenn Gonzalez, male   DOB: May 24, 1961, 54 y.o.   MRN: [id number] PCP: Dr Glenn Gonzalez  HPI: [name] is a 54 y/o male with long h/o CHF due to NICM (? related to chemo/XRT) with EF 25% s/p CRT-D [facility])  PMHx notable for Hodgkins lymphona (treated age 58-23) Treated with high dose chemo - including adriamycin and XRT. Also has AF, VT, PUD and asthma. Initial EF 13%. Previously seen at [facility] transplant clinic and felt to be doing too well to require Tx. Cath with normal coronaries. Has not been able to tolerate spironolactone or Inspra due to painful gynecomastia requiring surgery. Stopped statin due to myalgias.  07/02/2011 ECHO 25% 05/2012 ECHO EF 25%  10/2012 Dr Glenn Gonzalez reprogrammed BiV device due to PMT NSVT 08/12/13 EF 20-25%  CPX 04/2011:  pVO2 17.3 (59% predicted) VeVCo2 slope 38.4 RER 1.14  CPX 02/28/13  PVO2 19 (60% predicted) VEVCO2 slope 31. RER1.16  CPX 01/04/14 FVC 3.70 (67%)  FEV1 2.96 (69%)  Resting HR: 71 Peak HR: 101 (60.1% age predicted max HR) BP rest: 92/60 BP peak: 110/58 Peak VO2: 22.5 (64.6% predicted peak VO2) VE/VCO2 slope: 26.3 Peak RER: 1.17 Ventilatory Threshold: 15.6 (44.8% predicted peak VO2)  Labs  02/04/13 224  01/25/13 Pro BNP 2660 08/31/13 K 4.1 Creatinine 0.93 Pro BNP 1929  10/05/13 K 5.0 Creatinine 1.7 Pro BNP 250 01/09/14  K 4.2 Cr 1.14 9/15 K 4.9, creatinine 1.18, HCT 46.9, TSH normal  Follow up for HF: Returns for acute visit. In November and December had 2 URIs and was treated for bronchitis. Wasn't able to exercise and said he lost his strength. In January, weight was going up (range 196-199) but manageable with extra diuretics but weight would come back. Yesterday took extra 60 lasix and weight down 6 pounds (weight 194). Felt much better today. Weight unchanged since last visit. Only occasional dizziness.   ROS: All systems negative except as listed in HPI, PMH and Problem List.  Past Medical History  Diagnosis Date  . CHF (congestive  heart failure)   . Hodgkin's lymphoma 1980    IIIB  . A-fib   . GERD (gastroesophageal reflux disease)   . Asthma   . Pacemaker 2011    [place] device/2011  . Hypothyroidism   . Depression   . Pneumonia     recurrent pneumonia sedf.  rad tx for lymphoma    Current Outpatient Prescriptions  Medication Sig Dispense Refill  . acetaminophen (TYLENOL) 500 MG tablet Take 500 mg by mouth. 1 tab at bed time for back pain    . carvedilol (COREG) 25 MG tablet TAKE 1 TABLET (25 MG TOTAL) BY MOUTH 2 (TWO) TIMES DAILY WITH A MEAL. 180 tablet 2  . cetirizine (ZYRTEC) 10 MG tablet Take 10 mg by mouth daily.    . furosemide (LASIX) 40 MG tablet Take 1 tablet (40 mg total) by mouth daily. (Patient taking differently: Take 40 mg by mouth 2 (two) times daily. ) 90 tablet 2  . gabapentin (NEURONTIN) 300 MG capsule Take 300 mg by mouth at bedtime.    Marland Kitchen levothyroxine (SYNTHROID, LEVOTHROID) 137 MCG tablet Take 137 mcg by mouth daily before breakfast.    . quinapril (ACCUPRIL) 10 MG tablet Take 10 mg by mouth 2 (two) times daily.     . RABEprazole (ACIPHEX) 20 MG tablet Take 1 tablet (20 mg total) by mouth 2 (two) times daily. 180 tablet 3   No current facility-administered medications  for this encounter.    Filed Vitals:   01/02/15 1439  BP: 88/64  Pulse: 63  Weight: 206 lb 6.4 oz (93.622 kg)  SpO2: 96%   Standing 94/58  PHYSICAL EXAM: General:  Well appearing. No resp difficulty  HEENT: normal Neck: supple. JVP flat . Carotids 2+ bilaterally; no bruits. No lymphadenopathy or thryomegaly appreciated. Cor: PMI normal. Regular rate & rhythm. No rubs, gallops or murmurs. Lungs: clear Abdomen: soft, nontender, nondistended. No hepatosplenomegaly. No bruits or masses. Good bowel sounds. Extremities: no cyanosis, clubbing, rash, edema Neuro: alert & orientedx3, cranial nerves grossly intact. Moves all 4 extremities w/o difficulty. Affect pleasant.   ASSESSMENT & PLAN:  1.  Chronic systolic CHF: NICM, EF 123456 (08/2013), thought to be from chemo (adriamycin), s/p Medtronic CRT-D. CPX 2/15 Peak VO2: 22.5 (64.6% predicted peak VO2). VE/VCO2 slope: 26.3 (reassuring). NYHA class II.  BP low at times (including today) but denies orthostatic symptoms.  - Doing well. Seems to be feeling better after recovering from URI.  - Weight fluctuating significantly. Dry weight seems to be 194-196. Will increase lasix to 60 daily to try and maintain this. He will adjust as needed.  - Continue current Coreg and quinapril. BP too low for Entresto or to titrate other meds. .  - Could consider Corlanor but we have held off due to concern over decreased HR with exercise. We discussed this again today and he would like to wait until Spring to try. HR too low today to consider - No spironolactone or eplerenone due to gynecomastia. - Is scheduled for repeat CPX at end of February. - We also discussed CardioMems today. He prefers to defer at this point.  - Will check BMET and BNP next week.  2. History of lymphoma treated ( Age 45-23)  with adriamycin 3. Hypothyroid: On synthroid. Per PCP Dr Glenn Gonzalez.  4. Atrial tachycardia: Episodes of profound fatigue prior to last appointment may have been due to run of atrial tachycardia in the setting of volume overload.  This was found on ICD interrogation by Dr Glenn Gonzalez.  Detection threshold for tachyarrhythmias decreased to 130 bpm to make detection of AT episodes easier.   Glenn Bickers MD 01/02/2015

## 2015-01-02 NOTE — Addendum Note (Signed)
Encounter addended by: Scarlette Calico, RN on: 01/02/2015  3:17 PM<BR>     Documentation filed: Patient Instructions Section, Orders

## 2015-01-02 NOTE — Patient Instructions (Signed)
Increase Furosemide to 60 mg (1 & 1/2 tabs)  Daily  Lab in 1 week (bmet)  Your physician recommends that you schedule a follow-up appointment in: 1 month

## 2015-01-04 ENCOUNTER — Encounter: Payer: Self-pay | Admitting: *Deleted

## 2015-01-10 ENCOUNTER — Other Ambulatory Visit (HOSPITAL_COMMUNITY): Payer: Managed Care, Other (non HMO)

## 2015-01-11 ENCOUNTER — Ambulatory Visit (HOSPITAL_COMMUNITY): Admission: RE | Admit: 2015-01-11 | Payer: Managed Care, Other (non HMO) | Source: Ambulatory Visit

## 2015-01-12 ENCOUNTER — Ambulatory Visit (HOSPITAL_COMMUNITY)
Admission: RE | Admit: 2015-01-12 | Discharge: 2015-01-12 | Disposition: A | Discharge status: Home / Self Care | Payer: Managed Care, Other (non HMO) | Source: Ambulatory Visit | Attending: Cardiology | Admitting: Cardiology

## 2015-01-12 DIAGNOSIS — R0600 Dyspnea, unspecified: Secondary | ICD-10-CM

## 2015-01-12 DIAGNOSIS — I5022 Chronic systolic (congestive) heart failure: Secondary | ICD-10-CM | POA: Diagnosis present

## 2015-01-12 DIAGNOSIS — R0602 Shortness of breath: Secondary | ICD-10-CM

## 2015-01-12 LAB — BASIC METABOLIC PANEL
Anion gap: 11 (ref 5–15)
BUN: 40 mg/dL — ABNORMAL HIGH (ref 6–23)
CO2: 22 mmol/L (ref 19–32)
Calcium: 9 mg/dL (ref 8.4–10.5)
Chloride: 102 mmol/L (ref 96–112)
Creatinine, Ser: 1.28 mg/dL (ref 0.50–1.35)
GFR calc Af Amer: 72 mL/min — ABNORMAL LOW (ref >90)
GFR calc non Af Amer: 62 mL/min — ABNORMAL LOW (ref >90)
GLUCOSE: 114 mg/dL — AB (ref 70–99)
Potassium: 4.2 mmol/L (ref 3.5–5.1)
SODIUM: 135 mmol/L (ref 135–145)

## 2015-01-12 LAB — BRAIN NATRIURETIC PEPTIDE: B Natriuretic Peptide: 418.4 pg/mL — ABNORMAL HIGH (ref 0.0–100.0)

## 2015-01-29 ENCOUNTER — Ambulatory Visit (HOSPITAL_COMMUNITY): Payer: Managed Care, Other (non HMO)

## 2015-01-31 ENCOUNTER — Ambulatory Visit (INDEPENDENT_AMBULATORY_CARE_PROVIDER_SITE_OTHER): Payer: Managed Care, Other (non HMO) | Admitting: *Deleted

## 2015-01-31 ENCOUNTER — Encounter (HOSPITAL_COMMUNITY): Payer: Managed Care, Other (non HMO)

## 2015-01-31 ENCOUNTER — Telehealth (HOSPITAL_COMMUNITY): Payer: Self-pay | Admitting: Vascular Surgery

## 2015-01-31 ENCOUNTER — Other Ambulatory Visit: Payer: Self-pay | Admitting: Internal Medicine

## 2015-01-31 DIAGNOSIS — I471 Supraventricular tachycardia: Secondary | ICD-10-CM | POA: Diagnosis not present

## 2015-01-31 DIAGNOSIS — Z95810 Presence of automatic (implantable) cardiac defibrillator: Secondary | ICD-10-CM | POA: Diagnosis not present

## 2015-01-31 DIAGNOSIS — I429 Cardiomyopathy, unspecified: Secondary | ICD-10-CM | POA: Diagnosis not present

## 2015-01-31 DIAGNOSIS — I428 Other cardiomyopathies: Secondary | ICD-10-CM

## 2015-01-31 DIAGNOSIS — I5022 Chronic systolic (congestive) heart failure: Secondary | ICD-10-CM | POA: Diagnosis not present

## 2015-01-31 DIAGNOSIS — I472 Ventricular tachycardia: Secondary | ICD-10-CM

## 2015-01-31 DIAGNOSIS — I4729 Other ventricular tachycardia: Secondary | ICD-10-CM

## 2015-01-31 LAB — MDC_IDC_ENUM_SESS_TYPE_INCLINIC
Brady Statistic RA Percent Paced: 1 % — CL
Brady Statistic RV Percent Paced: 99 %
Date Time Interrogation Session: 20151008040000
HIGH POWER IMPEDANCE MEASURED VALUE: 54 Ohm
Lead Channel Impedance Value: 445 Ohm
Lead Channel Impedance Value: 556 Ohm
Lead Channel Pacing Threshold Amplitude: 0.6 V
Lead Channel Pacing Threshold Amplitude: 0.7 V
Lead Channel Pacing Threshold Pulse Width: 0.4 ms
Lead Channel Sensing Intrinsic Amplitude: 2.7 mV
Lead Channel Sensing Intrinsic Amplitude: 24.6 mV
Lead Channel Setting Pacing Amplitude: 1 V
Lead Channel Setting Pacing Amplitude: 2 V
Lead Channel Setting Pacing Amplitude: 2.1 V
Lead Channel Setting Pacing Pulse Width: 0.4 ms
Lead Channel Setting Pacing Pulse Width: 0.8 ms
MDC IDC MSMT LEADCHNL LV IMPEDANCE VALUE: 590 Ohm
MDC IDC MSMT LEADCHNL LV PACING THRESHOLD PULSEWIDTH: 0.8 ms
MDC IDC MSMT LEADCHNL LV SENSING INTR AMPL: 11.4 mV
MDC IDC MSMT LEADCHNL RV PACING THRESHOLD AMPLITUDE: 0.9 V
MDC IDC MSMT LEADCHNL RV PACING THRESHOLD PULSEWIDTH: 0.4 ms
MDC IDC PG SERIAL: 588967
MDC IDC SET LEADCHNL LV SENSING SENSITIVITY: 1 mV
MDC IDC SET LEADCHNL RV SENSING SENSITIVITY: 0.6 mV
MDC IDC SET ZONE DETECTION INTERVAL: 273 ms
MDC IDC SET ZONE DETECTION INTERVAL: 429 ms
Zone Setting Detection Interval: 300 ms

## 2015-01-31 LAB — CUP PACEART INCLINIC DEVICE CHECK
Date Time Interrogation Session: 20160803143553
HIGH POWER IMPEDANCE MEASURED VALUE: 50 Ohm
Lead Channel Impedance Value: 427 Ohm
Lead Channel Impedance Value: 500 Ohm
Lead Channel Impedance Value: 543 Ohm
Lead Channel Pacing Threshold Amplitude: 1 V
Lead Channel Pacing Threshold Pulse Width: 0.4 ms
Lead Channel Pacing Threshold Pulse Width: 0.4 ms
Lead Channel Sensing Intrinsic Amplitude: 9.3 mV
Lead Channel Setting Pacing Amplitude: 2 V
MDC IDC MSMT LEADCHNL LV PACING THRESHOLD AMPLITUDE: 0.8 V
MDC IDC MSMT LEADCHNL LV PACING THRESHOLD PULSEWIDTH: 0.8 ms
MDC IDC MSMT LEADCHNL RA PACING THRESHOLD AMPLITUDE: 0.8 V
MDC IDC MSMT LEADCHNL RA SENSING INTR AMPL: 1.8 mV
MDC IDC MSMT LEADCHNL RV SENSING INTR AMPL: 20.8 mV
MDC IDC PG SERIAL: 588967
MDC IDC SET LEADCHNL LV PACING AMPLITUDE: 1.2 V
MDC IDC SET LEADCHNL LV PACING PULSEWIDTH: 0.8 ms
MDC IDC SET LEADCHNL LV SENSING SENSITIVITY: 1 mV
MDC IDC SET LEADCHNL RV PACING AMPLITUDE: 2.1 V
MDC IDC SET LEADCHNL RV PACING PULSEWIDTH: 0.4 ms
MDC IDC SET LEADCHNL RV SENSING SENSITIVITY: 0.6 mV
MDC IDC SET ZONE DETECTION INTERVAL: 429 ms
MDC IDC STAT BRADY RA PERCENT PACED: 1 % — AB
MDC IDC STAT BRADY RV PERCENT PACED: 99 %
Zone Setting Detection Interval: 273 ms
Zone Setting Detection Interval: 300 ms

## 2015-01-31 NOTE — Progress Notes (Signed)
CRT-D device check in office. Thresholds and sensing consistent with previous device measurements. Lead impedance trends stable over time. 12 ATR---all <9min. 103 NSVT---2 EGMs---both SVT, both 6 beats. Patient bi-ventricularly pacing 99% of the time. Device programmed with appropriate safety margins for pt---changed LV output from 1.0 to 1.2V. Estimated longevity 6.35yrs. ROV w/ device clinic 05/02/15 & w/ Dr. [name] in 69mo.

## 2015-01-31 NOTE — Telephone Encounter (Signed)
PT called he was told to stop fluid pill to gain some water weight, it has been two day pt states he has gained no weight. PT wants to know if he needs to continue being off  of medication for another day.. Please advise

## 2015-01-31 NOTE — Telephone Encounter (Signed)
Advised pt hold meds again x 1 day Call office for further instructions as pt only gained one pound overnight

## 2015-02-01 ENCOUNTER — Ambulatory Visit (HOSPITAL_COMMUNITY): Payer: Managed Care, Other (non HMO) | Attending: Internal Medicine

## 2015-02-01 DIAGNOSIS — I5020 Unspecified systolic (congestive) heart failure: Secondary | ICD-10-CM | POA: Diagnosis not present

## 2015-02-02 ENCOUNTER — Other Ambulatory Visit (HOSPITAL_COMMUNITY): Payer: Self-pay

## 2015-02-02 DIAGNOSIS — I509 Heart failure, unspecified: Secondary | ICD-10-CM | POA: Diagnosis not present

## 2015-02-02 DIAGNOSIS — I5020 Unspecified systolic (congestive) heart failure: Secondary | ICD-10-CM

## 2015-02-08 ENCOUNTER — Encounter: Payer: Self-pay | Admitting: Internal Medicine

## 2015-02-13 ENCOUNTER — Ambulatory Visit (HOSPITAL_COMMUNITY)
Admission: RE | Admit: 2015-02-13 | Discharge: 2015-02-13 | Disposition: A | Discharge status: Home / Self Care | Payer: Managed Care, Other (non HMO) | Source: Ambulatory Visit | Attending: Internal Medicine | Admitting: Internal Medicine

## 2015-02-13 VITALS — BP 92/58 | HR 66 | Wt 204.4 lb

## 2015-02-13 DIAGNOSIS — R5382 Chronic fatigue, unspecified: Secondary | ICD-10-CM | POA: Diagnosis not present

## 2015-02-13 DIAGNOSIS — K219 Gastro-esophageal reflux disease without esophagitis: Secondary | ICD-10-CM | POA: Insufficient documentation

## 2015-02-13 DIAGNOSIS — N62 Hypertrophy of breast: Secondary | ICD-10-CM | POA: Insufficient documentation

## 2015-02-13 DIAGNOSIS — I5022 Chronic systolic (congestive) heart failure: Secondary | ICD-10-CM | POA: Diagnosis not present

## 2015-02-13 DIAGNOSIS — I471 Supraventricular tachycardia: Secondary | ICD-10-CM | POA: Diagnosis not present

## 2015-02-13 DIAGNOSIS — E039 Hypothyroidism, unspecified: Secondary | ICD-10-CM | POA: Insufficient documentation

## 2015-02-13 DIAGNOSIS — Z79899 Other long term (current) drug therapy: Secondary | ICD-10-CM | POA: Diagnosis not present

## 2015-02-13 DIAGNOSIS — F329 Major depressive disorder, single episode, unspecified: Secondary | ICD-10-CM | POA: Insufficient documentation

## 2015-02-13 DIAGNOSIS — G4700 Insomnia, unspecified: Secondary | ICD-10-CM

## 2015-02-13 DIAGNOSIS — I4891 Unspecified atrial fibrillation: Secondary | ICD-10-CM | POA: Diagnosis not present

## 2015-02-13 DIAGNOSIS — J45909 Unspecified asthma, uncomplicated: Secondary | ICD-10-CM | POA: Insufficient documentation

## 2015-02-13 DIAGNOSIS — Z8571 Personal history of Hodgkin lymphoma: Secondary | ICD-10-CM | POA: Diagnosis not present

## 2015-02-13 DIAGNOSIS — Z950 Presence of cardiac pacemaker: Secondary | ICD-10-CM | POA: Insufficient documentation

## 2015-02-13 DIAGNOSIS — Z8572 Personal history of non-Hodgkin lymphomas: Secondary | ICD-10-CM | POA: Diagnosis not present

## 2015-02-13 MED ORDER — CARVEDILOL 12.5 MG PO TABS: 18.7500 mg | ORAL_TABLET | Freq: Once | ORAL | Status: DC

## 2015-02-13 NOTE — Progress Notes (Signed)
Patient ID: Glenn Gonzalez, male   DOB: 08/18/1961, 53 y.o.   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 related to chemo/XRT) with EF 25% s/p CRT-D [facility] Sci)  PMHx notable for Hodgkins lymphona (treated age 45-23) Treated with high dose chemo - including adriamycin and XRT. Also has AF, VT, PUD and asthma. Initial EF 13%. Previously seen at [facility] transplant clinic and felt to be doing too well to require Tx. Cath with normal coronaries. Has not been able to tolerate spironolactone or Inspra due to painful gynecomastia requiring surgery. Stopped statin due to myalgias.  07/02/2011 ECHO 25% 05/2012 ECHO EF 25%  10/2012 Dr Glenn Gonzalez reprogrammed BiV device due to PMT NSVT 08/12/13 EF 20-25%  CPX 04/2011:  pVO2 17.3 (59% predicted) VeVCo2 slope 38.4 RER 1.14  CPX 02/28/13  PVO2 19 (60% predicted) VEVCO2 slope 31. RER1.16  CPX 01/04/14 FVC 3.70 (67%)  FEV1 2.96 (69%)  Peak VO2: 22.5 (64.6% predicted peak VO2) VE/VCO2 slope: 26.3 Peak RER: 1.17  CPX 02/02/15 HR 61-> 100 BP 80/56 -> 106/58 PVO2 21.2 (62% predicted) VEVCO2 slope 34. RER1.16  Labs  02/04/13 224  01/25/13 Pro BNP 2660 08/31/13 K 4.1 Creatinine 0.93 Pro BNP 1929  10/05/13 K 5.0 Creatinine 1.7 Pro BNP 250 01/09/14  K 4.2 Cr 1.14 9/15 K 4.9, creatinine 1.18, HCT 46.9, TSH normal  Follow up for HF: Returns for routine f/u/ We saw him in end of February for CPX but BP was too low d/t overdiuresis. Test rescheduled for 02/02/15 - did well. Results above. Since that time has only taken lasix 2x. Weight is very stable 197-198. Has felt well since CPX until about 2 days ago. For past 2 days has had fatigue and DOE. Sleeps poorly and wonders if this may be playing a role.   ROS: All systems negative except as listed in HPI, PMH and Problem List.  Past Medical History  Diagnosis Date  . CHF (congestive heart failure)   . Hodgkin's lymphoma 1980    IIIB  . A-fib   . GERD  (gastroesophageal reflux disease)   . Asthma   . Pacemaker 2011    Glenn Gonzalez device/2011  . Hypothyroidism   . Depression   . Pneumonia     recurrent pneumonia sedf.  rad tx for lymphoma    Current Outpatient Prescriptions  Medication Sig Dispense Refill  . acetaminophen (TYLENOL) 500 MG tablet Take 500 mg by mouth. 1 tab at bed time for back pain    . carvedilol (COREG) 25 MG tablet TAKE 1 TABLET (25 MG TOTAL) BY MOUTH 2 (TWO) TIMES DAILY WITH A MEAL. 180 tablet 2  . cetirizine (ZYRTEC) 10 MG tablet Take 10 mg by mouth daily.    . furosemide (LASIX) 40 MG tablet Take 1.5 tablets (60 mg total) by mouth daily. (Patient taking differently: Take 40 mg by mouth daily. ) 135 tablet 3  . gabapentin (NEURONTIN) 300 MG capsule Take 300 mg by mouth at bedtime.    Marland Kitchen levothyroxine (SYNTHROID, LEVOTHROID) 137 MCG tablet Take 125 mcg by mouth daily before breakfast.     . quinapril (ACCUPRIL) 10 MG tablet Take 10 mg by mouth 2 (two) times daily.     . RABEprazole (ACIPHEX) 20 MG tablet Take 1 tablet (20 mg total) by mouth 2 (two) times daily. 180 tablet 3   No current facility-administered medications for this encounter.  Filed Vitals:   02/13/15 1027  BP: 92/58  Pulse: 66  Weight: 204 lb 6.4 oz (92.715 kg)  SpO2: 96%    PHYSICAL EXAM: General:  Well appearing. No resp difficulty  HEENT: normal Neck: supple. JVP flat . Carotids 2+ bilaterally; no bruits. No lymphadenopathy or thryomegaly appreciated. Cor: PMI normal. Regular rate & rhythm. No rubs, gallops or murmurs. Lungs: clear Abdomen: soft, nontender, nondistended. No hepatosplenomegaly. No bruits or masses. Good bowel sounds. Extremities: no cyanosis, clubbing, rash, edema Neuro: alert & orientedx3, cranial nerves grossly intact. Moves all 4 extremities w/o difficulty. Affect pleasant.   ASSESSMENT & PLAN:  1. Chronic systolic CHF: NICM, EF 123456 (08/2013), thought to be from chemo (adriamycin), s/p [employer]onic  CRT-D. CPX 3/16 Peak VO2: 21.2 (64.6% predicted peak VO2). VE/VCO2 slope: 34 - CPX results reviewed personally with him. Results relatively stable. He can exercise fairly well but then ends up being very fatigued. Suspect NYHA II-III  - Weight and volume status a bit difficult to correlate but seems to be stable at this point. Will only take lasix for weight > 198. We discussed possibility of cardiomems at length. May consider in future. - On CPX had significant chronotropic incompetence. Will cut carvedilol back to 18.75 bid. - Continue quinapril. BP too low for Entresto or to titrate other meds. .  - No spironolactone or eplerenone due to gynecomastia. - Will send for sleep study due to poor sleep patterns. 2. History of lymphoma treated ( Age 46-23)  with adriamycin 3. Hypothyroid: On synthroid. Per PCP Dr Glenn Gonzalez.  4. Atrial tachycardia:  This was found on ICD interrogation by Dr Glenn Gonzalez.  Detection threshold for tachyarrhythmias decreased to 130 bpm to make detection of AT episodes easier.   Total time spent 35 minutes. Over half that time spent discussing above.   Glenn Bickers MD 02/13/2015

## 2015-02-13 NOTE — Patient Instructions (Signed)
DECREASE Carvedilol to 1 and 1/2 tablet twice daily.  You have been referred to Dr.Turner for a sleep study.  FOLLOW UP in 2 months.

## 2015-02-15 ENCOUNTER — Telehealth (HOSPITAL_COMMUNITY): Payer: Self-pay | Admitting: Vascular Surgery

## 2015-02-15 ENCOUNTER — Other Ambulatory Visit (HOSPITAL_COMMUNITY): Payer: Self-pay | Admitting: *Deleted

## 2015-02-15 MED ORDER — CARVEDILOL 12.5 MG PO TABS: 18.7500 mg | ORAL_TABLET | Freq: Once | ORAL | Status: DC

## 2015-02-15 MED ORDER — CARVEDILOL 12.5 MG PO TABS: 18.7500 mg | ORAL_TABLET | Freq: Two times a day (BID) | ORAL | Status: DC

## 2015-02-15 NOTE — Telephone Encounter (Signed)
Spoke [facility] they states rx that came over stated only 1.5 mg daily, advised that was a med error, pt does need 1.5 tabs Twice daily, new VO given

## 2015-02-15 NOTE — Telephone Encounter (Signed)
[facility] pharmacy called pt they need clarify Coreg directions.Erasmo Downer advise

## 2015-02-16 ENCOUNTER — Other Ambulatory Visit (HOSPITAL_COMMUNITY): Payer: Self-pay | Admitting: *Deleted

## 2015-02-16 MED ORDER — CARVEDILOL 12.5 MG PO TABS: 18.7500 mg | ORAL_TABLET | Freq: Two times a day (BID) | ORAL | Status: DC

## 2015-03-27 ENCOUNTER — Encounter: Payer: Self-pay | Admitting: Cardiology

## 2015-03-27 ENCOUNTER — Ambulatory Visit (INDEPENDENT_AMBULATORY_CARE_PROVIDER_SITE_OTHER): Payer: Managed Care, Other (non HMO) | Admitting: Cardiology

## 2015-03-27 VITALS — BP 86/60 | HR 67 | Ht 73.5 in | Wt 201.8 lb

## 2015-03-27 DIAGNOSIS — R5382 Chronic fatigue, unspecified: Secondary | ICD-10-CM

## 2015-03-27 DIAGNOSIS — I429 Cardiomyopathy, unspecified: Secondary | ICD-10-CM | POA: Diagnosis not present

## 2015-03-27 DIAGNOSIS — I428 Other cardiomyopathies: Secondary | ICD-10-CM

## 2015-03-27 DIAGNOSIS — G4700 Insomnia, unspecified: Secondary | ICD-10-CM

## 2015-03-27 DIAGNOSIS — I5022 Chronic systolic (congestive) heart failure: Secondary | ICD-10-CM

## 2015-03-27 NOTE — Patient Instructions (Signed)
Medication Instructions:  Your physician recommends that you continue on your current medications as directed. Please refer to the Current Medication list given to you today.   Labwork: None  Testing/Procedures: Your physician has recommended that you have a sleep study. This test records several body functions during sleep, including: brain activity, eye movement, oxygen and carbon dioxide blood levels, heart rate and rhythm, breathing rate and rhythm, the flow of air through your mouth and nose, snoring, body muscle movements, and chest and belly movement.  Follow-Up: Your physician recommends that you schedule a follow-up appointment with Dr. Radford Pax AS NEEDED pending the results of your study.  Any Other Special Instructions Will Be Listed Below (If Applicable).

## 2015-03-27 NOTE — Progress Notes (Signed)
Cardiology Office Note   Date:  03/27/2015   ID:  Glenn Gonzalez, DOB 1961-08-19, MRN [id number]  PCP:  Haywood Pao, MD    Chief Complaint  Patient presents with  . Fatigue  . Insomnia      History of Present Illness: Glenn Gonzalez is a 54 y.o. male who presents for evaluation of sleep apnea.  He has a history of chronic systolic CHF with NIDCM EF 25% thought to be related to chemo with adriamycin for lymphoma treated at age 92-23.  He has been having some problems with poor sleep and Dr. Haroldine Laws was concerned that he may have sleep apnea that is contributing to ongoing fatigue.  He says that had had a URI treated with antibiotics and halfway through the treatment he started having problems with sleep.  He says it would take him about 2 hours to fall asleep and then he would fall asleep and wake up an hour later.  Some nights he would not sleep at all.  He says that he continues to wake up frequently at night with insomnia.  He has not tried any sleep aides.  His wife says he does not snore unless he has a cold or allergy flare.  He denies any problems with restless legs unless he has been on a trip and walking a lot.  He denies any awakening gasping for breath.  He exercises in the am and not at night.  He drinks no ETOH or caffeine.  He does not nap at all during the day.    Past Medical History  Diagnosis Date  . CHF (congestive heart failure)   . Hodgkin's lymphoma 1980    IIIB  . A-fib   . GERD (gastroesophageal reflux disease)   . Asthma   . Pacemaker 2011    [place] device/2011  . Hypothyroidism   . Depression   . Pneumonia     recurrent pneumonia sedf.  rad tx for lymphoma    Past Surgical History  Procedure Laterality Date  . Neck biopses      x5  . Cholecystectomy    . Exploratory laparotomy    . Permanent pacemaker  2011    [employer] COGNIS device  . Gynemastia    . Portacath placement    . Tonsillectomy        Current Outpatient Prescriptions  Medication Sig Dispense Refill  . acetaminophen (TYLENOL) 500 MG tablet Take 500 mg by mouth. 1 tab at bed time for back pain    . carvedilol (COREG) 12.5 MG tablet Take 1.5 tablets (18.75 mg total) by mouth 2 (two) times daily with a meal. 180 tablet 3  . cetirizine (ZYRTEC) 10 MG tablet Take 10 mg by mouth daily.    . furosemide (LASIX) 40 MG tablet Take 1.5 tablets (60 mg total) by mouth daily. (Patient taking differently: Take 40 mg by mouth as needed for fluid. ) 135 tablet 3  . levothyroxine (SYNTHROID, LEVOTHROID) 125 MCG tablet Take 125 mcg by mouth daily.  3  . quinapril (ACCUPRIL) 10 MG tablet Take 10 mg by mouth 2 (two) times daily.     . RABEprazole (ACIPHEX) 20 MG tablet Take 1 tablet (20 mg total) by mouth 2 (two) times daily. 180 tablet 3   No current facility-administered medications for this visit.    Allergies:   Digoxin; Milk-related compounds; Penicillins; and Spironolactone    Social History:  The patient  reports that  he has never smoked. He does not have any smokeless tobacco history on file. He reports that he does not drink alcohol.   Family History:  The patient's family history includes Depression in his father; Hypertension in his father; Hyperthyroidism in his mother; Insomnia in his father and mother.    ROS:  Please see the history of present illness.   Otherwise, review of systems are positive for none.   All other systems are reviewed and negative.    PHYSICAL EXAM: VS:  BP 86/60 mmHg  Pulse 67  Ht 6' 1.5" (1.867 m)  Wt 201 lb 12.8 oz (91.536 kg)  BMI 26.26 kg/m2  SpO2 97% , BMI Body mass index is 26.26 kg/(m^2). GEN: Well nourished, well developed, in no acute distress HEENT: normal Neck: no JVD, carotid bruits, or masses Cardiac: RRR; no murmurs, rubs, or gallops,no edema  Respiratory:  clear to auscultation bilaterally, normal work of breathing GI: soft, nontender, nondistended, + BS MS: no deformity or  atrophy Skin: warm and dry, no rash Neuro:  Strength and sensation are intact Psych: euthymic mood, full affect   EKG:  EKG is not ordered today.    Recent Labs: 05/01/2014: Pro B Natriuretic peptide (BNP) 2166.0* 08/10/2014: ALT 13; Hemoglobin 15.7; Platelets 218; TSH 1.470 01/12/2015: B Natriuretic Peptide 418.4*; BUN 40*; Creatinine 1.28; Potassium 4.2; Sodium 135    Lipid Panel    Component Value Date/Time   CHOL 174 05/12/2008 1009   TRIG 60 05/12/2008 1009   HDL 31.1* 05/12/2008 1009   CHOLHDL 5.6 CALC 05/12/2008 1009   VLDL 12 05/12/2008 1009   LDLCALC 131* 05/12/2008 1009   LDLDIRECT 152.5 03/02/2007 0919      Wt Readings from Last 3 Encounters:  03/27/15 201 lb 12.8 oz (91.536 kg)  02/13/15 204 lb 6.4 oz (92.715 kg)  11/21/14 206 lb 1.9 oz (93.495 kg)     ASSESSMENT AND PLAN:  1.  Fatigue of unclear etiology.  This very well could be from severe insomnia of unclear etiology.  He did not have any problems with sleep onset or sleep maintenance prior to taking the antibiotics for his URI and now he cannot sleep.  Not sure that he has sleep apnea.  He does not snore and wife has not noticed him having any apneic events.  He does not drink caffeine or ETOH.  He does not exercise at night.  He is leary about taking any sleep aides.  I have recommended proceeding with sleep study.  If this shows no evidence of sleep apnea than I would recommend referral to neuro to see if there is any organic cause for his insomnia from a neurologic standpoint. 2.  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 Haroldine Laws. 3.  Hodgkins lymphoma IIIB in 1980 s/p chemo 4.  ICD and afib followed by Dr. [name]   Current medicines are reviewed at length with the patient today.  The patient does not have concerns regarding medicines.  The following changes have been made:  no change  Labs/ tests ordered today include: see above assessment and  plan  Orders Placed This Encounter  Procedures  . Split night study     Disposition:   FU with me after sleep study if positive for sleep apnea   Signed, Sueanne Margarita, MD  03/27/2015 11:03 PM    [facility] [address], [place], [place]  [address] Phone: [phone]; Fax: [phone]

## 2015-04-09 ENCOUNTER — Other Ambulatory Visit (HOSPITAL_COMMUNITY): Payer: Self-pay | Admitting: *Deleted

## 2015-04-10 ENCOUNTER — Other Ambulatory Visit (HOSPITAL_COMMUNITY): Payer: Self-pay | Admitting: *Deleted

## 2015-04-10 ENCOUNTER — Telehealth (HOSPITAL_COMMUNITY): Payer: Self-pay | Admitting: *Deleted

## 2015-04-10 NOTE — Telephone Encounter (Signed)
Pt called c/o increased SOB and stating he is feeling more arrhythmias, he states wt is down about 5 lb and denies edema.  He states he is unable to lay flat at night due to SOB and last night he was able to sleep sitting up in chair.  appt sch for tomorrow

## 2015-04-11 ENCOUNTER — Other Ambulatory Visit (HOSPITAL_COMMUNITY): Payer: Self-pay

## 2015-04-11 ENCOUNTER — Ambulatory Visit (HOSPITAL_COMMUNITY)
Admission: RE | Admit: 2015-04-11 | Discharge: 2015-04-11 | Disposition: A | Discharge status: Home / Self Care | Payer: Managed Care, Other (non HMO) | Source: Ambulatory Visit | Attending: Cardiology | Admitting: Cardiology

## 2015-04-11 ENCOUNTER — Encounter (HOSPITAL_COMMUNITY): Payer: Self-pay

## 2015-04-11 VITALS — BP 106/60 | HR 62 | Wt 201.2 lb

## 2015-04-11 DIAGNOSIS — R002 Palpitations: Secondary | ICD-10-CM | POA: Diagnosis not present

## 2015-04-11 DIAGNOSIS — I5022 Chronic systolic (congestive) heart failure: Secondary | ICD-10-CM | POA: Diagnosis not present

## 2015-04-11 DIAGNOSIS — Z79899 Other long term (current) drug therapy: Secondary | ICD-10-CM | POA: Diagnosis not present

## 2015-04-11 DIAGNOSIS — I429 Cardiomyopathy, unspecified: Secondary | ICD-10-CM | POA: Insufficient documentation

## 2015-04-11 DIAGNOSIS — J45909 Unspecified asthma, uncomplicated: Secondary | ICD-10-CM | POA: Diagnosis not present

## 2015-04-11 DIAGNOSIS — I471 Supraventricular tachycardia: Secondary | ICD-10-CM | POA: Diagnosis not present

## 2015-04-11 DIAGNOSIS — Z9221 Personal history of antineoplastic chemotherapy: Secondary | ICD-10-CM | POA: Insufficient documentation

## 2015-04-11 DIAGNOSIS — Z8579 Personal history of other malignant neoplasms of lymphoid, hematopoietic and related tissues: Secondary | ICD-10-CM | POA: Insufficient documentation

## 2015-04-11 DIAGNOSIS — Z923 Personal history of irradiation: Secondary | ICD-10-CM | POA: Diagnosis not present

## 2015-04-11 DIAGNOSIS — I5020 Unspecified systolic (congestive) heart failure: Secondary | ICD-10-CM

## 2015-04-11 DIAGNOSIS — R5383 Other fatigue: Secondary | ICD-10-CM | POA: Diagnosis not present

## 2015-04-11 DIAGNOSIS — E039 Hypothyroidism, unspecified: Secondary | ICD-10-CM | POA: Diagnosis not present

## 2015-04-11 DIAGNOSIS — K219 Gastro-esophageal reflux disease without esophagitis: Secondary | ICD-10-CM | POA: Insufficient documentation

## 2015-04-11 DIAGNOSIS — Z95810 Presence of automatic (implantable) cardiac defibrillator: Secondary | ICD-10-CM | POA: Insufficient documentation

## 2015-04-11 LAB — BASIC METABOLIC PANEL
Anion gap: 10 (ref 5–15)
BUN: 24 mg/dL — ABNORMAL HIGH (ref 6–20)
CO2: 26 mmol/L (ref 22–32)
CREATININE: 1.08 mg/dL (ref 0.61–1.24)
Calcium: 9.3 mg/dL (ref 8.9–10.3)
Chloride: 102 mmol/L (ref 101–111)
GFR calc non Af Amer: >60 mL/min (ref >60)
Glucose, Bld: 102 mg/dL — ABNORMAL HIGH (ref 70–99)
Potassium: 4.4 mmol/L (ref 3.5–5.1)
Sodium: 138 mmol/L (ref 135–145)

## 2015-04-11 LAB — CBC
HEMATOCRIT: 48.8 % (ref 39.0–52.0)
HEMOGLOBIN: 16 g/dL (ref 13.0–17.0)
MCH: 31 pg (ref 26.0–34.0)
MCHC: 32.8 g/dL (ref 30.0–36.0)
MCV: 94.6 fL (ref 78.0–100.0)
Platelets: 161 10*3/uL (ref 150–400)
RBC: 5.16 MIL/uL (ref 4.22–5.81)
RDW: 15.1 % (ref 11.5–15.5)
WBC: 5.7 10*3/uL (ref 4.0–10.5)

## 2015-04-11 LAB — TSH: TSH: 1.486 u[IU]/mL (ref 0.350–4.500)

## 2015-04-11 LAB — BRAIN NATRIURETIC PEPTIDE: B Natriuretic Peptide: 440.3 pg/mL — ABNORMAL HIGH (ref 0.0–100.0)

## 2015-04-11 NOTE — Patient Instructions (Signed)
Chest xray today.  Will schedule you for an echocardiogram as soon as possible.  We have scheduled you for a left and right heart catheterization next Friday, 04/20/15 at 7:30 am. See instruction sheet for additional details.  Do the following things EVERYDAY: 1) Weigh yourself in the morning before breakfast. Write it down and keep it in a log. 2) Take your medicines as prescribed 3) Eat low salt foods-Limit salt (sodium) to 2000 mg per day.  4) Stay as active as you can everyday 5) Limit all fluids for the day to less than 2 liters

## 2015-04-11 NOTE — Addendum Note (Signed)
Encounter addended by: Effie Berkshire, RN on: 04/11/2015 10:43 AM<BR>     Documentation filed: [employer], Patient Instructions Section, Orders

## 2015-04-11 NOTE — Progress Notes (Signed)
Patient ID: Glenn Gonzalez, male   DOB: 06/13/1961, 54 y.o.   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 related to chemo/XRT) with EF 25% s/p CRT-D [facility] Sci)  PMHx notable for Hodgkins lymphona (treated age 35-23) Treated with high dose chemo - including adriamycin and XRT. Also has AF, VT, PUD and asthma. Initial EF 13%. Previously seen at [facility] transplant clinic and felt to be doing too well to require Tx. Cath with normal coronaries. Has not been able to tolerate spironolactone or Inspra due to painful gynecomastia requiring surgery. Stopped statin due to myalgias.  07/02/2011 ECHO 25% 05/2012 ECHO EF 25%  10/2012 Dr [name] reprogrammed BiV device due to PMT NSVT 08/12/13 EF 20-25%  CPX 04/2011:  pVO2 17.3 (59% predicted) VeVCo2 slope 38.4 RER 1.14  CPX 02/28/13  PVO2 19 (60% predicted) VEVCO2 slope 31. RER1.16  CPX 01/04/14 FVC 3.70 (67%)  FEV1 2.96 (69%)  Peak VO2: 22.5 (64.6% predicted peak VO2) VE/VCO2 slope: 26.3 Peak RER: 1.17  CPX 02/02/15 HR 61-> 100 BP 80/56 -> 106/58 PVO2 21.2 (62% predicted) VEVCO2 slope 34. RER1.16  Labs  02/04/13 224  01/25/13 Pro BNP 2660 08/31/13 K 4.1 Creatinine 0.93 Pro BNP 1929  10/05/13 K 5.0 Creatinine 1.7 Pro BNP 250 01/09/14  K 4.2 Cr 1.14 9/15 K 4.9, creatinine 1.18, HCT 46.9, TSH normal  Follow up for HF: Here for unscheduled f/u. Says over the past 2 weeks having more arrhythmias. Heart skipping a lot of beats. Feels fatigued. Happening all day long. Upped carvedilol back to 25 bid (we lowered it due to chronotropic incompetence on CPX). Also c/o abdominal fullness. Still working out but feels that he has less energy. Feels weaker. Had an episode of orthopnea a couple days ago and took extra lasix. Says he is still losing weight. Down to 194 from 197. 3 weeks ago walked 25 miles in 3 days with his kids but can't do it now.    ROS: All systems negative except as listed in HPI, PMH and  Problem List.  Past Medical History  Diagnosis Date  . CHF (congestive heart failure)   . Hodgkin's lymphoma 1980    IIIB  . A-fib   . GERD (gastroesophageal reflux disease)   . Asthma   . Pacemaker 2011    [place] device/2011  . Hypothyroidism   . Depression   . Pneumonia     recurrent pneumonia sedf.  rad tx for lymphoma    Current Outpatient Prescriptions  Medication Sig Dispense Refill  . acetaminophen (TYLENOL) 500 MG tablet Take 500 mg by mouth. 1 tab at bed time for back pain    . carvedilol (COREG) 25 MG tablet Take 25 mg by mouth 2 (two) times daily with a meal.    . cetirizine (ZYRTEC) 10 MG tablet Take 10 mg by mouth daily.    . furosemide (LASIX) 40 MG tablet Take 1.5 tablets (60 mg total) by mouth daily. (Patient taking differently: Take 40 mg by mouth as needed for fluid. ) 135 tablet 3  . levothyroxine (SYNTHROID, LEVOTHROID) 125 MCG tablet Take 125 mcg by mouth daily.  3  . quinapril (ACCUPRIL) 10 MG tablet Take 10 mg by mouth 2 (two) times daily.     . RABEprazole (ACIPHEX) 20 MG tablet Take 1 tablet (20 mg total) by mouth 2 (two) times daily. 180 tablet 3   No current  facility-administered medications for this encounter.    Filed Vitals:   04/11/15 0852  BP: 106/60  Pulse: 62  Weight: 201 lb 4 oz (91.286 kg)  SpO2: 97%    PHYSICAL EXAM: General:  Well appearing. No resp difficulty  HEENT: normal Neck: supple. JVP flat . Carotids 2+ bilaterally; no bruits. No lymphadenopathy or thryomegaly appreciated. Cor: PMI normal. Regular rate & rhythm. No rubs, gallops or murmurs. Lungs: clear Abdomen: soft, nontender, nondistended. No hepatosplenomegaly. No bruits or masses. Good bowel sounds. Extremities: no cyanosis, clubbing, rash, edema Neuro: alert & orientedx3, cranial nerves grossly intact. Moves all 4 extremities w/o difficulty. Affect pleasant.   ASSESSMENT & PLAN:  1. Chronic systolic CHF: NICM, EF 123456 (08/2013), thought to be  from chemo (adriamycin), s/p [employer] CRT-D. CPX 3/16 Peak VO2: 21.2 (64.6% predicted peak VO2). VE/VCO2 slope: 34 --He has NYHA III-IIIB symptoms despite a lack of volume overload on exam. CPX test looks ok with pVO2. But Ve/VCO2 higher. I am not sure why he feels so bad.  --Will get repeat echo, check labs and CXR. Proceed with R/L HC. We discussed possibility of cardiomems device at that time but he would like to think about it more. --Continue current meds  2. Palpitations --interrogate ICD 3. History of lymphoma treated ( Age 90-23)  with adriamycin 4. Hypothyroid: On synthroid. Per PCP Dr Glenn Gonzalez.  5. Atrial tachycardia:  This was found on ICD interrogation by Dr [name].  Detection threshold for tachyarrhythmias decreased to 130 bpm to make detection of AT episodes easier.    Total time spent 45 minutes. Over half that time spent discussing above.   [name] MD 04/11/2015

## 2015-04-12 ENCOUNTER — Ambulatory Visit (HOSPITAL_COMMUNITY)
Admission: RE | Admit: 2015-04-12 | Discharge: 2015-04-12 | Disposition: A | Discharge status: Home / Self Care | Payer: Managed Care, Other (non HMO) | Source: Ambulatory Visit | Attending: Cardiology | Admitting: Cardiology

## 2015-04-12 DIAGNOSIS — I340 Nonrheumatic mitral (valve) insufficiency: Secondary | ICD-10-CM | POA: Diagnosis not present

## 2015-04-12 DIAGNOSIS — I5022 Chronic systolic (congestive) heart failure: Secondary | ICD-10-CM

## 2015-04-12 DIAGNOSIS — I313 Pericardial effusion (noninflammatory): Secondary | ICD-10-CM | POA: Diagnosis not present

## 2015-04-12 DIAGNOSIS — I509 Heart failure, unspecified: Secondary | ICD-10-CM | POA: Diagnosis present

## 2015-04-16 ENCOUNTER — Telehealth (HOSPITAL_COMMUNITY): Payer: Self-pay | Admitting: Cardiology

## 2015-04-16 NOTE — Telephone Encounter (Signed)
Pt scheduled for R/L HC on 04/20/15 with Dr. Haroldine Laws Cpt code [phone] With pts current insurance- [employer] Pre cert# [id number] valid until 07/11/15

## 2015-04-20 ENCOUNTER — Encounter (HOSPITAL_COMMUNITY)
Admission: RE | Disposition: A | Discharge status: Home / Self Care | Payer: Managed Care, Other (non HMO) | Source: Ambulatory Visit | Attending: Internal Medicine

## 2015-04-20 ENCOUNTER — Ambulatory Visit (HOSPITAL_COMMUNITY)
Admission: RE | Admit: 2015-04-20 | Discharge: 2015-04-20 | Disposition: A | Discharge status: Home / Self Care | Payer: Managed Care, Other (non HMO) | Source: Ambulatory Visit | Attending: Internal Medicine | Admitting: Internal Medicine

## 2015-04-20 DIAGNOSIS — Z8571 Personal history of Hodgkin lymphoma: Secondary | ICD-10-CM | POA: Diagnosis not present

## 2015-04-20 DIAGNOSIS — E039 Hypothyroidism, unspecified: Secondary | ICD-10-CM | POA: Diagnosis not present

## 2015-04-20 DIAGNOSIS — Z95810 Presence of automatic (implantable) cardiac defibrillator: Secondary | ICD-10-CM | POA: Insufficient documentation

## 2015-04-20 DIAGNOSIS — I429 Cardiomyopathy, unspecified: Secondary | ICD-10-CM | POA: Insufficient documentation

## 2015-04-20 DIAGNOSIS — I471 Supraventricular tachycardia: Secondary | ICD-10-CM | POA: Insufficient documentation

## 2015-04-20 DIAGNOSIS — I5022 Chronic systolic (congestive) heart failure: Secondary | ICD-10-CM

## 2015-04-20 DIAGNOSIS — I4891 Unspecified atrial fibrillation: Secondary | ICD-10-CM | POA: Diagnosis not present

## 2015-04-20 DIAGNOSIS — I5020 Unspecified systolic (congestive) heart failure: Secondary | ICD-10-CM

## 2015-04-20 HISTORY — PX: CARDIAC CATHETERIZATION: SHX172

## 2015-04-20 LAB — CBC
HCT: 41.7 % (ref 39.0–52.0)
Hemoglobin: 13.6 g/dL (ref 13.0–17.0)
MCH: 30.5 pg (ref 26.0–34.0)
MCHC: 32.6 g/dL (ref 30.0–36.0)
MCV: 93.5 fL (ref 78.0–100.0)
PLATELETS: 179 10*3/uL (ref 150–400)
RBC: 4.46 MIL/uL (ref 4.22–5.81)
RDW: 14.8 % (ref 11.5–15.5)
WBC: 8.3 10*3/uL (ref 4.0–10.5)

## 2015-04-20 LAB — POCT I-STAT 3, VENOUS BLOOD GAS (G3P V)
Bicarbonate: 25.7 mEq/L — ABNORMAL HIGH (ref 20.0–24.0)
Bicarbonate: 25.8 mEq/L — ABNORMAL HIGH (ref 20.0–24.0)
O2 SAT: 60 %
O2 SAT: 63 %
PCO2 VEN: 44 mmHg — AB (ref 45.0–50.0)
PCO2 VEN: 45.6 mmHg (ref 45.0–50.0)
PH VEN: 7.36 — AB (ref 7.250–7.300)
PO2 VEN: 33 mmHg (ref 30.0–45.0)
PO2 VEN: 33 mmHg (ref 30.0–45.0)
TCO2: 27 mmol/L (ref 0–100)
TCO2: 27 mmol/L (ref 0–100)
pH, Ven: 7.374 — ABNORMAL HIGH (ref 7.250–7.300)

## 2015-04-20 LAB — BASIC METABOLIC PANEL
Anion gap: 8 (ref 5–15)
BUN: 26 mg/dL — AB (ref 6–20)
CHLORIDE: 101 mmol/L (ref 101–111)
CO2: 26 mmol/L (ref 22–32)
Calcium: 8.8 mg/dL — ABNORMAL LOW (ref 8.9–10.3)
Creatinine, Ser: 1.17 mg/dL (ref 0.61–1.24)
GFR calc Af Amer: >60 mL/min (ref >60)
GFR calc non Af Amer: >60 mL/min (ref >60)
GLUCOSE: 96 mg/dL (ref 65–99)
Potassium: 3.9 mmol/L (ref 3.5–5.1)
Sodium: 135 mmol/L (ref 135–145)

## 2015-04-20 LAB — POCT ACTIVATED CLOTTING TIME: Activated Clotting Time: 135 seconds

## 2015-04-20 LAB — PROTIME-INR
INR: 1.12 (ref 0.00–1.49)
Prothrombin Time: 14.6 seconds (ref 11.6–15.2)

## 2015-04-20 SURGERY — RIGHT/LEFT HEART CATH AND CORONARY ANGIOGRAPHY
Anesthesia: LOCAL

## 2015-04-20 MED ORDER — SODIUM CHLORIDE 0.9 % IV SOLN: 250.0000 mL | INTRAVENOUS | Status: DC | PRN

## 2015-04-20 MED ORDER — LIDOCAINE HCL (PF) 1 % IJ SOLN
INTRAMUSCULAR | Status: AC
  Filled 2015-04-20: qty 30

## 2015-04-20 MED ORDER — ACETAMINOPHEN 325 MG PO TABS
ORAL_TABLET | ORAL | Status: AC
  Filled 2015-04-20: qty 2

## 2015-04-20 MED ORDER — VERAPAMIL HCL 2.5 MG/ML IV SOLN
INTRAVENOUS | Status: AC
  Filled 2015-04-20: qty 2

## 2015-04-20 MED ORDER — HEPARIN (PORCINE) IN NACL 2-0.9 UNIT/ML-% IJ SOLN
INTRAMUSCULAR | Status: AC
  Filled 2015-04-20: qty 1000

## 2015-04-20 MED ORDER — HEPARIN SODIUM (PORCINE) 1000 UNIT/ML IJ SOLN
INTRAMUSCULAR | Status: AC
  Filled 2015-04-20: qty 1

## 2015-04-20 MED ORDER — SODIUM CHLORIDE 0.9 % IV SOLN
INTRAVENOUS | Status: DC
  Administered 2015-04-20: 06:00:00 via INTRAVENOUS

## 2015-04-20 MED ORDER — SODIUM CHLORIDE 0.9 % IJ SOLN: 3.0000 mL | INTRAMUSCULAR | Status: DC | PRN

## 2015-04-20 MED ORDER — HEPARIN SODIUM (PORCINE) 1000 UNIT/ML IJ SOLN
INTRAMUSCULAR | Status: DC | PRN
  Administered 2015-04-20: 4000 [IU] via INTRAVENOUS

## 2015-04-20 MED ORDER — VERAPAMIL HCL 2.5 MG/ML IV SOLN
INTRAVENOUS | Status: DC | PRN
  Administered 2015-04-20: 11:00:00 via INTRA_ARTERIAL

## 2015-04-20 MED ORDER — SODIUM CHLORIDE 0.9 % IV SOLN: INTRAVENOUS | Status: AC

## 2015-04-20 MED ORDER — ONDANSETRON HCL 4 MG/2ML IJ SOLN: 4.0000 mg | Freq: Four times a day (QID) | INTRAMUSCULAR | Status: DC | PRN

## 2015-04-20 MED ORDER — SODIUM CHLORIDE 0.9 % IJ SOLN: 3.0000 mL | Freq: Two times a day (BID) | INTRAMUSCULAR | Status: DC

## 2015-04-20 MED ORDER — ACETAMINOPHEN 325 MG PO TABS
650.0000 mg | ORAL_TABLET | ORAL | Status: DC | PRN
  Administered 2015-04-20: 650 mg via ORAL

## 2015-04-20 SURGICAL SUPPLY — 20 items
CATH INFINITI 5 FR JL3.5 (CATHETERS) ×1 IMPLANT
CATH INFINITI 5FR ANG PIGTAIL (CATHETERS) ×2 IMPLANT
CATH INFINITI 5FR MULTPACK ANG (CATHETERS) IMPLANT
CATH INFINITI JR4 5F (CATHETERS) ×2 IMPLANT
CATH SWAN GANZ 7F STRAIGHT (CATHETERS) ×2 IMPLANT
CATH SWAN GANZ VIP 7.5F (CATHETERS) ×2 IMPLANT
DEVICE RAD COMP TR BAND LRG (VASCULAR PRODUCTS) ×2 IMPLANT
GLIDESHEATH SLEND SS 6F .021 (SHEATH) ×2 IMPLANT
KIT HEART LEFT (KITS) ×2 IMPLANT
KIT HEART RIGHT NAMIC (KITS) ×2 IMPLANT
PACK CARDIAC CATHETERIZATION (CUSTOM PROCEDURE TRAY) ×2 IMPLANT
SHEATH PINNACLE 5F 10CM (SHEATH) IMPLANT
SHEATH PINNACLE 7F 10CM (SHEATH) ×2 IMPLANT
SYR MEDRAD MARK V 150ML (SYRINGE) ×2 IMPLANT
TRANSDUCER W/STOPCOCK (MISCELLANEOUS) ×4 IMPLANT
TUBING CIL FLEX 10 FLL-RA (TUBING) ×2 IMPLANT
WIRE EMERALD 3MM-J .025X260CM (WIRE) ×2 IMPLANT
WIRE EMERALD 3MM-J .035X150CM (WIRE) IMPLANT
WIRE HI TORQ VERSACORE-J 145CM (WIRE) ×2 IMPLANT
WIRE SAFE-T 1.5MM-J .035X260CM (WIRE) ×2 IMPLANT

## 2015-04-20 NOTE — H&P (View-Only) (Signed)
Patient ID: Glenn Gonzalez, male   DOB: [birth year], 54 y.o.   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 related to chemo/XRT) with EF 25% s/p CRT-D [facility] Sci)  PMHx notable for Hodgkins lymphona (treated age 72-23) Treated with high dose chemo - including adriamycin and XRT. Also has AF, VT, PUD and asthma. Initial EF 13%. Previously seen at [facility] transplant clinic and felt to be doing too well to require Tx. Cath with normal coronaries. Has not been able to tolerate spironolactone or Inspra due to painful gynecomastia requiring surgery. Stopped statin due to myalgias.  07/02/2011 ECHO 25% 05/2012 ECHO EF 25%  10/2012 Dr [name] reprogrammed BiV device due to PMT NSVT 08/12/13 EF 20-25%  CPX 04/2011:  pVO2 17.3 (59% predicted) VeVCo2 slope 38.4 RER 1.14  CPX 02/28/13  PVO2 19 (60% predicted) VEVCO2 slope 31. RER1.16  CPX 01/04/14 FVC 3.70 (67%)  FEV1 2.96 (69%)  Peak VO2: 22.5 (64.6% predicted peak VO2) VE/VCO2 slope: 26.3 Peak RER: 1.17  CPX 02/02/15 HR 61-> 100 BP 80/56 -> 106/58 PVO2 21.2 (62% predicted) VEVCO2 slope 34. RER1.16  Labs  02/04/13 224  01/25/13 Pro BNP 2660 08/31/13 K 4.1 Creatinine 0.93 Pro BNP 1929  10/05/13 K 5.0 Creatinine 1.7 Pro BNP 250 01/09/14  K 4.2 Cr 1.14 9/15 K 4.9, creatinine 1.18, HCT 46.9, TSH normal  Follow up for HF: Here for unscheduled f/u. Says over the past 2 weeks having more arrhythmias. Heart skipping a lot of beats. Feels fatigued. Happening all day long. Upped carvedilol back to 25 bid (we lowered it due to chronotropic incompetence on CPX). Also c/o abdominal fullness. Still working out but feels that he has less energy. Feels weaker. Had an episode of orthopnea a couple days ago and took extra lasix. Says he is still losing weight. Down to 194 from 197. 3 weeks ago walked 25 miles in 3 days with his kids but can't do it now.    ROS: All systems negative except as listed in HPI, PMH and  Problem List.  Past Medical History  Diagnosis Date  . CHF (congestive heart failure)   . Hodgkin's lymphoma 1980    IIIB  . A-fib   . GERD (gastroesophageal reflux disease)   . Asthma   . Pacemaker 2011    [place] device/2011  . Hypothyroidism   . Depression   . Pneumonia     recurrent pneumonia sedf.  rad tx for lymphoma    Current Outpatient Prescriptions  Medication Sig Dispense Refill  . acetaminophen (TYLENOL) 500 MG tablet Take 500 mg by mouth. 1 tab at bed time for back pain    . carvedilol (COREG) 25 MG tablet Take 25 mg by mouth 2 (two) times daily with a meal.    . cetirizine (ZYRTEC) 10 MG tablet Take 10 mg by mouth daily.    . furosemide (LASIX) 40 MG tablet Take 1.5 tablets (60 mg total) by mouth daily. (Patient taking differently: Take 40 mg by mouth as needed for fluid. ) 135 tablet 3  . levothyroxine (SYNTHROID, LEVOTHROID) 125 MCG tablet Take 125 mcg by mouth daily.  3  . quinapril (ACCUPRIL) 10 MG tablet Take 10 mg by mouth 2 (two) times daily.     . RABEprazole (ACIPHEX) 20 MG tablet Take 1 tablet (20 mg total) by mouth 2 (two) times daily. 180 tablet 3   No current  facility-administered medications for this encounter.    Filed Vitals:   04/11/15 0852  BP: 106/60  Pulse: 62  Weight: 201 lb 4 oz (91.286 kg)  SpO2: 97%    PHYSICAL EXAM: General:  Well appearing. No resp difficulty  HEENT: normal Neck: supple. JVP flat . Carotids 2+ bilaterally; no bruits. No lymphadenopathy or thryomegaly appreciated. Cor: PMI normal. Regular rate & rhythm. No rubs, gallops or murmurs. Lungs: clear Abdomen: soft, nontender, nondistended. No hepatosplenomegaly. No bruits or masses. Good bowel sounds. Extremities: no cyanosis, clubbing, rash, edema Neuro: alert & orientedx3, cranial nerves grossly intact. Moves all 4 extremities w/o difficulty. Affect pleasant.   ASSESSMENT & PLAN:  1. Chronic systolic CHF: NICM, EF 123456 (08/2013), thought to be  from chemo (adriamycin), s/p [employer] CRT-D. CPX 3/16 Peak VO2: 21.2 (64.6% predicted peak VO2). VE/VCO2 slope: 34 --He has NYHA III-IIIB symptoms despite a lack of volume overload on exam. CPX test looks ok with pVO2. But Ve/VCO2 higher. I am not sure why he feels so bad.  --Will get repeat echo, check labs and CXR. Proceed with R/L HC. We discussed possibility of cardiomems device at that time but he would like to think about it more. --Continue current meds  2. Palpitations --interrogate ICD 3. History of lymphoma treated ( Age 13-23)  with adriamycin 4. Hypothyroid: On synthroid. Per PCP Dr Osborne Casco.  5. Atrial tachycardia:  This was found on ICD interrogation by Dr [name].  Detection threshold for tachyarrhythmias decreased to 130 bpm to make detection of AT episodes easier.    Total time spent 45 minutes. Over half that time spent discussing above.   [name] MD 04/11/2015

## 2015-04-20 NOTE — Interval H&P Note (Signed)
History and Physical Interval Note:  04/20/2015 10:45 AM  Glenn Gonzalez  has presented today for surgery, with the diagnosis of chf  The various methods of treatment have been discussed with the patient and family. After consideration of risks, benefits and other options for treatment, the patient has consented to  Procedure(s): Right/Left Heart Cath and Coronary Angiography (N/A) and possible angioplasty as a surgical intervention .  The patient's history has been reviewed, patient examined, no change in status, stable for surgery.  I have reviewed the patient's chart and labs.  Questions were answered to the patient's satisfaction.     [name], Quillian Quince

## 2015-04-20 NOTE — Discharge Instructions (Signed)
Radial Site Care °Refer to this sheet in the next few weeks. These instructions provide you with information on caring for yourself after your procedure. Your caregiver may also give you more specific instructions. Your treatment has been planned according to current medical practices, but problems sometimes occur. Call your caregiver if you have any problems or questions after your procedure. °HOME CARE INSTRUCTIONS °· You may shower the day after the procedure. Remove the bandage (dressing) and gently wash the site with plain soap and water. Gently pat the site dry. °· Do not apply powder or lotion to the site. °· Do not submerge the affected site in water for 3 to 5 days. °· Inspect the site at least twice daily. °· Do not flex or bend the affected arm for 24 hours. °· No lifting over 5 pounds (2.3 kg) for 5 days after your procedure. °· Do not drive home if you are discharged the same day of the procedure. Have someone else drive you. °· You may drive 24 hours after the procedure unless otherwise instructed by your caregiver. °· Do not operate machinery or power tools for 24 hours. °· A responsible adult should be with you for the first 24 hours after you arrive home. °What to expect: °· Any bruising will usually fade within 1 to 2 weeks. °· Blood that collects in the tissue (hematoma) may be painful to the touch. It should usually decrease in size and tenderness within 1 to 2 weeks. °SEEK IMMEDIATE MEDICAL CARE IF: °· You have unusual pain at the radial site. °· You have redness, warmth, swelling, or pain at the radial site. °· You have drainage (other than a small amount of blood on the dressing). °· You have chills. °· You have a fever or persistent symptoms for more than 72 hours. °· You have a fever and your symptoms suddenly get worse. °· Your arm becomes pale, cool, tingly, or numb. °· You have heavy bleeding from the site. Hold pressure on the site and call 911. °Document Released: 12/20/2010 Document  Revised: 02/09/2012 Document Reviewed: 12/20/2010 °ExitCare® Patient Information ©2015 [employer]. This information is not intended to replace advice given to you by your health care provider. Make sure you discuss any questions you have with your health care provider. ° °

## 2015-04-23 ENCOUNTER — Encounter (HOSPITAL_COMMUNITY): Payer: Self-pay | Admitting: Internal Medicine

## 2015-04-23 MED FILL — Heparin Sodium (Porcine) Inj 1000 Unit/ML: INTRAMUSCULAR | Qty: 10 | Status: AC

## 2015-04-23 MED FILL — Lidocaine HCl Local Preservative Free (PF) Inj 1%: INTRAMUSCULAR | Qty: 30 | Status: AC

## 2015-04-23 MED FILL — Heparin Sodium (Porcine) 2 Unit/ML in Sodium Chloride 0.9%: INTRAMUSCULAR | Qty: 1000 | Status: AC

## 2015-05-11 ENCOUNTER — Telehealth (HOSPITAL_COMMUNITY): Payer: Self-pay | Admitting: *Deleted

## 2015-05-11 NOTE — Telephone Encounter (Signed)
Pt requests echo results. Please advise

## 2015-05-25 ENCOUNTER — Ambulatory Visit (HOSPITAL_BASED_OUTPATIENT_CLINIC_OR_DEPARTMENT_OTHER): Payer: Managed Care, Other (non HMO) | Attending: Cardiology

## 2015-05-31 ENCOUNTER — Ambulatory Visit (HOSPITAL_COMMUNITY)
Admission: RE | Admit: 2015-05-31 | Discharge: 2015-05-31 | Disposition: A | Discharge status: Home / Self Care | Payer: Managed Care, Other (non HMO) | Source: Ambulatory Visit | Attending: Internal Medicine | Admitting: Internal Medicine

## 2015-05-31 ENCOUNTER — Encounter (HOSPITAL_COMMUNITY): Payer: Self-pay

## 2015-05-31 VITALS — BP 85/61 | HR 75 | Resp 18 | Wt 199.0 lb

## 2015-05-31 DIAGNOSIS — Z6740 Type O blood, Rh positive: Secondary | ICD-10-CM | POA: Insufficient documentation

## 2015-05-31 DIAGNOSIS — K219 Gastro-esophageal reflux disease without esophagitis: Secondary | ICD-10-CM | POA: Diagnosis not present

## 2015-05-31 DIAGNOSIS — Z950 Presence of cardiac pacemaker: Secondary | ICD-10-CM | POA: Diagnosis not present

## 2015-05-31 DIAGNOSIS — E039 Hypothyroidism, unspecified: Secondary | ICD-10-CM | POA: Diagnosis not present

## 2015-05-31 DIAGNOSIS — J45909 Unspecified asthma, uncomplicated: Secondary | ICD-10-CM | POA: Diagnosis not present

## 2015-05-31 DIAGNOSIS — F329 Major depressive disorder, single episode, unspecified: Secondary | ICD-10-CM | POA: Diagnosis not present

## 2015-05-31 DIAGNOSIS — I5022 Chronic systolic (congestive) heart failure: Secondary | ICD-10-CM

## 2015-05-31 DIAGNOSIS — Z79899 Other long term (current) drug therapy: Secondary | ICD-10-CM | POA: Diagnosis not present

## 2015-05-31 DIAGNOSIS — I471 Supraventricular tachycardia: Secondary | ICD-10-CM | POA: Insufficient documentation

## 2015-05-31 DIAGNOSIS — Z8572 Personal history of non-Hodgkin lymphomas: Secondary | ICD-10-CM | POA: Insufficient documentation

## 2015-05-31 MED ORDER — QUINAPRIL HCL 10 MG PO TABS: 10.0000 mg | ORAL_TABLET | Freq: Every day | ORAL | Status: DC

## 2015-05-31 MED ORDER — CARVEDILOL 12.5 MG PO TABS: 12.5000 mg | ORAL_TABLET | Freq: Two times a day (BID) | ORAL | Status: DC

## 2015-05-31 NOTE — Progress Notes (Signed)
Patient ID: Glenn Gonzalez, male   DOB: 09-09-1961, 54 y.o.   MRN: [id number] PCP: Dr Osborne Casco AHF Cardiologist: Bensimhon  HPI: [name] is a 54 y/o male with long h/o CHF due to NICM (? related to chemo/XRT) with EF 25% s/p CRT-D [facility])  PMHx notable for Hodgkins lymphona (treated age [age]) Treated with high dose chemo - including adriamycin and XRT. Also has AF, VT, PUD and asthma. Initial EF 13%. Previously seen at [facility] transplant clinic and felt to be doing too well to require Tx. Cath with normal coronaries. Has not been able to tolerate spironolactone or Inspra due to painful gynecomastia requiring surgery. Stopped statin due to myalgias.  Follow up for HF: Since last visit was seen by Dr. Jerilee Hoh at [facility]. Felt to be too early for transplant. We decided on close f/u and repeating in CPX in 6 months. Palpitations resolved with reprogramming of ICD. Exercise tolerance up and down. Couple of days can exercise well and then next 2 days can't do anything. Weight going down just a bit. Now 193. SBP now 80/40-50. He cut carvedilol back to 18.75 bid. Cannot take diuretic any more because it drops BP.    Studies:  R & LHC 6/16 Normal coronaries RA = 8 RV = 43/2/12 PA = 47/19 (30) PCW = 23 with v waves to 35 Fick cardiac output/index = 4.4/2.1 Thermo CO/CI = 3.3/1.6 PVR = 1.5 SVR = 1261 FA sat = 97% PA sat = 60%, 63%   07/02/2011 ECHO 25% 05/2012 ECHO EF 25%  10/2012 Dr [name] reprogrammed BiV device due to PMT NSVT 08/12/13 EF 20-25% 5/16 Echo EF 15% RV normal  CPX 04/2011:  pVO2 17.3 (59% predicted) VeVCo2 slope 38.4 RER 1.14  CPX 02/28/13  PVO2 19 (60% predicted) VEVCO2 slope 31. RER1.16  CPX 01/04/14 FVC 3.70 (67%)  FEV1 2.96 (69%)  Peak VO2: 22.5 (64.6% predicted peak VO2) VE/VCO2 slope: 26.3 Peak RER: 1.17  CPX 02/02/15 HR 61-> 100 BP 80/56 -> 106/58 PVO2 21.2 (62% predicted) VEVCO2 slope 34. RER1.16  Labs  02/04/13 224  01/25/13 Pro BNP 2660 08/31/13 K 4.1  Creatinine 0.93 Pro BNP 1929  10/05/13 K 5.0 Creatinine 1.7 Pro BNP 250 01/09/14  K 4.2 Cr 1.14 9/15 K 4.9, creatinine 1.18, HCT 46.9, TSH normal   ROS: All systems negative except as listed in HPI, PMH and Problem List.  Past Medical History  Diagnosis Date  . CHF (congestive heart failure)   . Hodgkin's lymphoma 1980    IIIB  . A-fib   . GERD (gastroesophageal reflux disease)   . Asthma   . Pacemaker 2011    [place] device/2011  . Hypothyroidism   . Depression   . Pneumonia     recurrent pneumonia sedf.  rad tx for lymphoma    Current Outpatient Prescriptions  Medication Sig Dispense Refill  . acetaminophen (TYLENOL) 500 MG tablet Take 1,000 mg by mouth at bedtime.     . carvedilol (COREG) 12.5 MG tablet Take 18.75 mg by mouth 2 (two) times daily with a meal.    . cetirizine (ZYRTEC) 10 MG tablet Take 10 mg by mouth daily.    . furosemide (LASIX) 40 MG tablet Take 40 mg by mouth daily as needed.    Marland Kitchen levothyroxine (SYNTHROID, LEVOTHROID) 125 MCG tablet Take 125 mcg by mouth daily.  3  . quinapril (ACCUPRIL) 10 MG tablet Take 10 mg by mouth 2 (two) times daily.     Marland Kitchen  RABEprazole (ACIPHEX) 20 MG tablet Take 1 tablet (20 mg total) by mouth 2 (two) times daily. 180 tablet 3   No current facility-administered medications for this encounter.    Filed Vitals:   05/31/15 1008  BP: 85/61  Pulse: 75  Resp: 18  Weight: 199 lb (90.266 kg)  SpO2: 98%    PHYSICAL EXAM: General:  Well appearing. No resp difficulty  HEENT: normal Neck: supple. JVP flat . Carotids 2+ bilaterally; no bruits. No lymphadenopathy or thryomegaly appreciated. Cor: PMI normal. Regular rate & rhythm. No rubs, gallops or murmurs. Lungs: clear Abdomen: soft, nontender, nondistended. No hepatosplenomegaly. No bruits or masses. Good bowel sounds. Extremities: no cyanosis, clubbing, rash, edema Neuro: alert & orientedx3, cranial nerves grossly intact. Moves all 4 extremities w/o  difficulty. Affect pleasant.   ASSESSMENT & PLAN:  1. Chronic systolic CHF: NICM, EF 0000000 (04/2015), thought to be from chemo (adriamycin), s/p Medtronic CRT-D. CPX 3/16 Peak VO2: 21.2 (64.6% predicted peak VO2). VE/VCO2 slope: 34 --He has NYHA III-IIIB symptoms despite a lack of volume overload on exam. CPX test looks ok with pVO2. But Ve/VCO2 higher. Recent RHC borderline' - Even though he remains functional, he has multiple signs that he is continuing to deteriorate with worsening EF on echo and cath, borderline cardiac output, decreasing exercise tolerance, worsening hypotension and increased slope on CPX - I feel he is getting very close to the point where he will need to be listed for transplant. He was seen at [facility] recently and felt to be be a bit too early but I think he is nearing the threshold. Will cut carvedilol back to 12.5 bid and can cut quinapril back to 10 daily if needed. Will repeat CPX test in 2-3 weeks and see where he is at. Given young age and good activity level in past would use 50% of predicted pVO2 as target cutoff for advanced therapies.  2. Palpitations - resolved with reprogramming of ICD 3. History of lymphoma treated ( Age 84-23)  with adriamycin 4. Hypothyroid: On synthroid. Per PCP Dr Osborne Casco.  5. Atrial tachycardia:  This was found on ICD interrogation by Dr [name].  Detection threshold for tachyarrhythmias decreased to 130 bpm to make detection of AT episodes easier.  6. Blood type Lequita Halt MD 05/31/2015

## 2015-05-31 NOTE — Patient Instructions (Signed)
DECREASE Carvedilol (Coreg) to 12.5mg  twice daily.  DECREASE Quinipril to 10mg  once at bedtime.  Will schedule you for Cardiopulmonary Exercise Test in 3 weeks. Wear comfortable clothes and shoes. Avoid alcohol, tobacco, and caffeine products 12 hrs before test. Refrain from eating a heavy meal before test (light meals/snacks okay).  Follow up 4-6 weeks.  Do the following things EVERYDAY: 1) Weigh yourself in the morning before breakfast. Write it down and keep it in a log. 2) Take your medicines as prescribed 3) Eat low salt foods-Limit salt (sodium) to 2000 mg per day.  4) Stay as active as you can everyday 5) Limit all fluids for the day to less than 2 liters

## 2015-05-31 NOTE — Addendum Note (Signed)
Encounter addended by: Effie Berkshire, RN on: 05/31/2015 11:14 AM<BR>     Documentation filed: Dx Association, Patient Instructions Section, Orders

## 2015-06-06 ENCOUNTER — Other Ambulatory Visit: Payer: Self-pay | Admitting: Gastroenterology

## 2015-06-06 ENCOUNTER — Telehealth (HOSPITAL_COMMUNITY): Payer: Self-pay | Admitting: Cardiology

## 2015-06-06 NOTE — Telephone Encounter (Signed)
Pt to have cpx per dr bensimhon With pts current insurance-[employer] Cpt code [phone] does not require pre cert

## 2015-06-07 ENCOUNTER — Encounter: Payer: Self-pay | Admitting: Internal Medicine

## 2015-06-22 ENCOUNTER — Telehealth (HOSPITAL_COMMUNITY): Payer: Self-pay | Admitting: *Deleted

## 2015-06-22 NOTE — Telephone Encounter (Signed)
Received note from Dr Benson Norway wanted clearance for pt to have colonoscopy on 8/5, per Dr Haroldine Laws ok to proceed, note faxed back to them at [phone]

## 2015-06-27 ENCOUNTER — Ambulatory Visit (HOSPITAL_COMMUNITY): Payer: Managed Care, Other (non HMO) | Attending: Internal Medicine

## 2015-06-27 DIAGNOSIS — I5022 Chronic systolic (congestive) heart failure: Secondary | ICD-10-CM | POA: Diagnosis not present

## 2015-06-28 ENCOUNTER — Other Ambulatory Visit (HOSPITAL_COMMUNITY): Payer: Self-pay | Admitting: *Deleted

## 2015-06-28 DIAGNOSIS — I5022 Chronic systolic (congestive) heart failure: Secondary | ICD-10-CM

## 2015-06-29 ENCOUNTER — Encounter (HOSPITAL_COMMUNITY): Payer: Self-pay | Admitting: *Deleted

## 2015-07-09 ENCOUNTER — Ambulatory Visit (INDEPENDENT_AMBULATORY_CARE_PROVIDER_SITE_OTHER): Payer: Managed Care, Other (non HMO) | Admitting: *Deleted

## 2015-07-09 DIAGNOSIS — I471 Supraventricular tachycardia: Secondary | ICD-10-CM | POA: Diagnosis not present

## 2015-07-09 DIAGNOSIS — I4589 Other specified conduction disorders: Secondary | ICD-10-CM

## 2015-07-09 DIAGNOSIS — I5022 Chronic systolic (congestive) heart failure: Secondary | ICD-10-CM

## 2015-07-09 LAB — CUP PACEART INCLINIC DEVICE CHECK
Brady Statistic RA Percent Paced: 1 % — CL
Date Time Interrogation Session: 20160808040000
HIGH POWER IMPEDANCE MEASURED VALUE: 52 Ohm
Lead Channel Impedance Value: 485 Ohm
Lead Channel Impedance Value: 547 Ohm
Lead Channel Pacing Threshold Amplitude: 0.9 V
Lead Channel Pacing Threshold Amplitude: 0.9 V
Lead Channel Pacing Threshold Amplitude: 1 V
Lead Channel Pacing Threshold Pulse Width: 0.4 ms
Lead Channel Pacing Threshold Pulse Width: 0.8 ms
Lead Channel Sensing Intrinsic Amplitude: 10.3 mV
Lead Channel Sensing Intrinsic Amplitude: 3 mV
Lead Channel Setting Pacing Pulse Width: 0.4 ms
Lead Channel Setting Pacing Pulse Width: 0.8 ms
Lead Channel Setting Sensing Sensitivity: 0.6 mV
MDC IDC MSMT LEADCHNL RA IMPEDANCE VALUE: 428 Ohm
MDC IDC MSMT LEADCHNL RA PACING THRESHOLD PULSEWIDTH: 0.4 ms
MDC IDC MSMT LEADCHNL RV SENSING INTR AMPL: 18 mV
MDC IDC PG SERIAL: 588967
MDC IDC SET LEADCHNL LV PACING AMPLITUDE: 1.2 V
MDC IDC SET LEADCHNL LV SENSING SENSITIVITY: 1 mV
MDC IDC SET LEADCHNL RA PACING AMPLITUDE: 2 V
MDC IDC SET LEADCHNL RV PACING AMPLITUDE: 2.1 V
MDC IDC SET ZONE DETECTION INTERVAL: 273 ms
MDC IDC STAT BRADY RV PERCENT PACED: 98 %
Zone Setting Detection Interval: 300 ms
Zone Setting Detection Interval: 429 ms

## 2015-07-09 NOTE — Progress Notes (Signed)
CRT-D device check in office. Thresholds and sensing consistent with previous device measurements. Lead impedance trends stable over time. 22 ATR episodes all < 1 min, pk A 275bpm. 139 non sustained ventricular arrhythmia episodes recorded- longest 12 seconds, pk V 151bpm. Patient bi-ventricularly pacing 98% of the time. Device programmed with appropriate safety margins. Heart failure diagnostics reviewed and trends are stable for patient. No changes made this session. Estimated longevity 6 years.  ROV with SK 10-17-15 at 10:15am.

## 2015-07-24 ENCOUNTER — Telehealth (HOSPITAL_COMMUNITY): Payer: Self-pay | Admitting: *Deleted

## 2015-07-24 NOTE — Telephone Encounter (Signed)
Patient called due to increased SOB, and SOB while sleeping. Last night he had severe chills with no fever. He says he has had more discomfort in his chest lately. He did take a diuretic yesterday and wonders if that is associated with this. He said that his weight has been stable. No edema.

## 2015-07-24 NOTE — Telephone Encounter (Signed)
Spoke w/pt, he states he has been having episode of chest tightness and SOB, especially when he lays down at night, he states he wakes up gasping.  He states he feels like he is more dehydrated than fluid overload, pt with abn cpx, appt sch for tomorrow at 11:20 w/Dr Bensimhon

## 2015-07-25 ENCOUNTER — Ambulatory Visit (HOSPITAL_COMMUNITY)
Admission: RE | Admit: 2015-07-25 | Discharge: 2015-07-25 | Disposition: A | Discharge status: Home / Self Care | Payer: Managed Care, Other (non HMO) | Source: Ambulatory Visit | Attending: Cardiology | Admitting: Cardiology

## 2015-07-25 ENCOUNTER — Encounter (HOSPITAL_COMMUNITY): Payer: Self-pay

## 2015-07-25 VITALS — BP 80/62 | HR 75 | Resp 18 | Wt 197.8 lb

## 2015-07-25 DIAGNOSIS — Z9221 Personal history of antineoplastic chemotherapy: Secondary | ICD-10-CM | POA: Insufficient documentation

## 2015-07-25 DIAGNOSIS — E039 Hypothyroidism, unspecified: Secondary | ICD-10-CM | POA: Diagnosis not present

## 2015-07-25 DIAGNOSIS — I471 Supraventricular tachycardia: Secondary | ICD-10-CM | POA: Insufficient documentation

## 2015-07-25 DIAGNOSIS — Z79899 Other long term (current) drug therapy: Secondary | ICD-10-CM | POA: Insufficient documentation

## 2015-07-25 DIAGNOSIS — I4891 Unspecified atrial fibrillation: Secondary | ICD-10-CM | POA: Diagnosis not present

## 2015-07-25 DIAGNOSIS — I428 Other cardiomyopathies: Secondary | ICD-10-CM | POA: Diagnosis not present

## 2015-07-25 DIAGNOSIS — Z8572 Personal history of non-Hodgkin lymphomas: Secondary | ICD-10-CM | POA: Diagnosis not present

## 2015-07-25 DIAGNOSIS — Z95810 Presence of automatic (implantable) cardiac defibrillator: Secondary | ICD-10-CM | POA: Diagnosis not present

## 2015-07-25 DIAGNOSIS — I5022 Chronic systolic (congestive) heart failure: Secondary | ICD-10-CM | POA: Diagnosis not present

## 2015-07-25 DIAGNOSIS — Z923 Personal history of irradiation: Secondary | ICD-10-CM | POA: Diagnosis not present

## 2015-07-25 LAB — BASIC METABOLIC PANEL
Anion gap: 10 (ref 5–15)
BUN: 25 mg/dL — ABNORMAL HIGH (ref 6–20)
CALCIUM: 8.7 mg/dL — AB (ref 8.9–10.3)
CO2: 22 mmol/L (ref 22–32)
Chloride: 103 mmol/L (ref 101–111)
Creatinine, Ser: 1.41 mg/dL — ABNORMAL HIGH (ref 0.61–1.24)
GFR, EST NON AFRICAN AMERICAN: 55 mL/min — AB (ref >60)
Glucose, Bld: 105 mg/dL — ABNORMAL HIGH (ref 65–99)
Potassium: 4.5 mmol/L (ref 3.5–5.1)
Sodium: 135 mmol/L (ref 135–145)

## 2015-07-25 LAB — BRAIN NATRIURETIC PEPTIDE: B Natriuretic Peptide: 444.9 pg/mL — ABNORMAL HIGH (ref 0.0–100.0)

## 2015-07-25 MED ORDER — SACUBITRIL-VALSARTAN 24-26 MG PO TABS: 1.0000 | ORAL_TABLET | Freq: Two times a day (BID) | ORAL | Status: DC

## 2015-07-25 NOTE — Patient Instructions (Signed)
STOP Accupril.  START Entresto 24/26mg  tablet twice daily.  Routine lab work today. Will notify you of abnormal results, otherwise no news is good news!  Will refer you to [facility].  Follow up 4-6 weeks.  Do the following things EVERYDAY: 1) Weigh yourself in the morning before breakfast. Write it down and keep it in a log. 2) Take your medicines as prescribed 3) Eat low salt foods-Limit salt (sodium) to 2000 mg per day.  4) Stay as active as you can everyday 5) Limit all fluids for the day to less than 2 liters

## 2015-07-25 NOTE — Addendum Note (Signed)
Encounter addended by: Effie Berkshire, RN on: 07/25/2015 12:17 PM<BR>     Documentation filed: Medications, Dx Association, Patient Instructions Section, Orders

## 2015-07-25 NOTE — Progress Notes (Signed)
ReDs Vest Reading 29

## 2015-07-25 NOTE — Progress Notes (Signed)
Patient ID: Glenn Gonzalez, male   DOB: 09/25/1961, 54 y.o.   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 related to chemo/XRT) with EF 25% s/p CRT-D [facility])  PMHx notable for Hodgkins lymphona (treated age 80-23) Treated with high dose chemo - including adriamycin and XRT. Also has AF, VT, PUD and asthma. Initial EF 13%. Previously seen at [facility] transplant clinic and felt to be doing too well to require Tx. Cath with normal coronaries. Has not been able to tolerate spironolactone or Inspra due to painful gynecomastia requiring surgery. Stopped statin due to myalgias.  Follow up for HF: Was seen by Dr. Jerilee Hoh at [facility] in June. Felt to still be too early for transplant. We decided on close f/u and repeating in CPX . CPX in July with pVO2 18.8 (down from 54). Over past few days was having heaviness in his chest and orthopnea. Tried lasix and really didn't work. Then had GI bug and los several pounds and felt better. Orthopnea resolved. Stopped accupril for 2 days so BP would be high enough to take lasix. After stopping accupril BP went to 110-115. SBP now 80-90s  Vest reading today was 29   Studies:  R & LHC 6/16 Normal coronaries RA = 8 RV = 43/2/12 PA = 47/19 (30) PCW = 23 with v waves to 35 Fick cardiac output/index = 4.4/2.1 Thermo CO/CI = 3.3/1.6 PVR = 1.5 SVR = 1261 FA sat = 97% PA sat = 60%, 63%   07/02/2011 ECHO 25% 05/2012 ECHO EF 25%  10/2012 Dr [name] reprogrammed BiV device due to PMT NSVT 08/12/13 EF 20-25% 5/16 Echo EF 15% RV normal  CPX 04/2011:  pVO2 17.3 (59% predicted) VeVCo2 slope 38.4 RER 1.14  CPX 02/28/13  PVO2 19 (60% predicted) VEVCO2 slope 31. RER1.16  CPX 01/04/14 FVC 3.70 (67%)  FEV1 2.96 (69%)  Peak VO2: 22.5 (64.6% predicted peak VO2) VE/VCO2 slope: 26.3 Peak RER: 1.17  CPX 02/02/15 HR 61-> 100 BP 80/56 -> 106/58 PVO2 21.2 (62% predicted) VEVCO2 slope  34. RER1.16  CPX 06/28/15 Resting HR: 68 Peak HR: 102  (61% age predicted max HR) BP rest: 92/66 BP peak: 100/58 Peak VO2: 18.8 (54.6% predicted peak VO2) VE/VCO2 slope: 33.3 OUES: 1.68 Peak RER: 1.13 Ventilatory Threshold: 11.8 (34.3% predicted or measured peak VO2) VE/MVV: 45.4% O2pulse: 17  (89.5% predicted O2pulse)  Labs  02/04/13 224  01/25/13 Pro BNP 2660 08/31/13 K 4.1 Creatinine 0.93 Pro BNP 1929  10/05/13 K 5.0 Creatinine 1.7 Pro BNP 250 01/09/14  K 4.2 Cr 1.14 9/15 K 4.9, creatinine 1.18, HCT 46.9, TSH normal   ROS: All systems negative except as listed in HPI, PMH and Problem List.  Past Medical History  Diagnosis Date  . CHF (congestive heart failure)   . A-fib   . GERD (gastroesophageal reflux disease)   . Asthma   . Pacemaker 2011    [place] device/2011  . Hypothyroidism   . Depression   . Pneumonia     recurrent pneumonia sedf.  rad tx for lymphoma  . Hodgkin's lymphoma 1980    IIIB. 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    Current Outpatient Prescriptions  Medication Sig Dispense Refill  . acetaminophen (TYLENOL) 650 MG CR tablet Take 650 mg by mouth at bedtime.    . carvedilol (COREG) 12.5 MG tablet Take 1 tablet (12.5  mg total) by mouth 2 (two) times daily with a meal. 180 tablet 3  . cetirizine (ZYRTEC) 10 MG tablet Take 10 mg by mouth daily.    . furosemide (LASIX) 40 MG tablet Take 40 mg by mouth daily as needed for fluid.     Marland Kitchen levothyroxine (SYNTHROID, LEVOTHROID) 125 MCG tablet Take 125 mcg by mouth daily.  3  . quinapril (ACCUPRIL) 10 MG tablet Take 1 tablet (10 mg total) by mouth at bedtime. (Patient taking differently: Take 10 mg by mouth daily. )    . RABEprazole (ACIPHEX) 20 MG tablet Take 1 tablet (20 mg total) by mouth 2 (two) times daily. 180 tablet 3   No current facility-administered medications for this encounter.    Filed Vitals:   07/25/15 1117  BP: 80/62  Pulse: 75  Resp: 18  Weight: 197  lb 12 oz (89.699 kg)  SpO2: 97%    PHYSICAL EXAM: General:  Well appearing. No resp difficulty  HEENT: normal Neck: supple. JVP flat . Carotids 2+ bilaterally; no bruits. No lymphadenopathy or thryomegaly appreciated. Cor: PMI normal. Regular rate & rhythm. No rubs, gallops or murmurs. Lungs: clear Abdomen: soft, nontender, nondistended. No hepatosplenomegaly. No bruits or masses. Good bowel sounds. Extremities: no cyanosis, clubbing, rash, tr edema on R Neuro: alert & orientedx3, cranial nerves grossly intact. Moves all 4 extremities w/o difficulty. Affect pleasant.   ASSESSMENT & PLAN:  1. Chronic systolic CHF: NICM, EF 0000000 (04/2015), thought to be from chemo (adriamycin), s/p Medtronic CRT-D. CPX 3/16 Peak VO2: 18.8 (54% predicted peak VO2). VE/VCO2 slope: 33 --Continues with NYHA III-IIIB symptoms. Recent CPX worse but still may not be clearly in window for Tx. I will d/w Dr. Jerilee Hoh at [facility].  BP remains quite soft. Given young age and good activity level in past would use 50% of predicted pVO2 as target cutoff for advanced therapies.  --It seems he does need some diuretic in his regimen but has hard time tolerating lasix. Will switch accupril to low dose Entresto 24/26 bid and see how he tolerates.  --At his request, we will send him to [facility] for second opinion on Tx timing.  2. Palpitations - resolved with reprogramming of ICD 3. History of lymphoma treated ( Age 54-23)  with adriamycin 4. Hypothyroid: On synthroid. Per PCP Dr Osborne Casco.  5. Atrial tachycardia:  This was found on ICD interrogation by Dr [name].  Detection threshold for tachyarrhythmias decreased to 130 bpm to make detection of AT episodes easier.  6. Blood type Lequita Halt MD 07/25/2015

## 2015-07-26 ENCOUNTER — Encounter (HOSPITAL_COMMUNITY): Payer: Self-pay | Admitting: *Deleted

## 2015-07-31 ENCOUNTER — Other Ambulatory Visit: Payer: Self-pay

## 2015-07-31 ENCOUNTER — Emergency Department (HOSPITAL_COMMUNITY): Payer: Managed Care, Other (non HMO)

## 2015-07-31 ENCOUNTER — Emergency Department (HOSPITAL_COMMUNITY)
Admission: EM | Admit: 2015-07-31 | Discharge: 2015-07-31 | Disposition: A | Discharge status: Home / Self Care | Payer: Managed Care, Other (non HMO) | Attending: Emergency Medicine | Admitting: Emergency Medicine

## 2015-07-31 ENCOUNTER — Telehealth (HOSPITAL_COMMUNITY): Payer: Self-pay

## 2015-07-31 ENCOUNTER — Encounter (HOSPITAL_COMMUNITY): Payer: Self-pay

## 2015-07-31 ENCOUNTER — Encounter (HOSPITAL_COMMUNITY): Payer: Self-pay | Admitting: Emergency Medicine

## 2015-07-31 DIAGNOSIS — K219 Gastro-esophageal reflux disease without esophagitis: Secondary | ICD-10-CM | POA: Insufficient documentation

## 2015-07-31 DIAGNOSIS — Z8659 Personal history of other mental and behavioral disorders: Secondary | ICD-10-CM | POA: Insufficient documentation

## 2015-07-31 DIAGNOSIS — E039 Hypothyroidism, unspecified: Secondary | ICD-10-CM | POA: Insufficient documentation

## 2015-07-31 DIAGNOSIS — Z880 Allergy status to penicillin: Secondary | ICD-10-CM | POA: Diagnosis not present

## 2015-07-31 DIAGNOSIS — I509 Heart failure, unspecified: Secondary | ICD-10-CM | POA: Diagnosis not present

## 2015-07-31 DIAGNOSIS — Z79899 Other long term (current) drug therapy: Secondary | ICD-10-CM | POA: Insufficient documentation

## 2015-07-31 DIAGNOSIS — Z9889 Other specified postprocedural states: Secondary | ICD-10-CM | POA: Diagnosis not present

## 2015-07-31 DIAGNOSIS — Z8571 Personal history of Hodgkin lymphoma: Secondary | ICD-10-CM | POA: Diagnosis not present

## 2015-07-31 DIAGNOSIS — Z950 Presence of cardiac pacemaker: Secondary | ICD-10-CM | POA: Diagnosis not present

## 2015-07-31 DIAGNOSIS — N200 Calculus of kidney: Secondary | ICD-10-CM

## 2015-07-31 DIAGNOSIS — I4891 Unspecified atrial fibrillation: Secondary | ICD-10-CM | POA: Diagnosis not present

## 2015-07-31 DIAGNOSIS — Z792 Long term (current) use of antibiotics: Secondary | ICD-10-CM | POA: Diagnosis not present

## 2015-07-31 DIAGNOSIS — Z8701 Personal history of pneumonia (recurrent): Secondary | ICD-10-CM | POA: Diagnosis not present

## 2015-07-31 DIAGNOSIS — J45901 Unspecified asthma with (acute) exacerbation: Secondary | ICD-10-CM | POA: Diagnosis not present

## 2015-07-31 DIAGNOSIS — R109 Unspecified abdominal pain: Secondary | ICD-10-CM | POA: Diagnosis present

## 2015-07-31 LAB — URINALYSIS, ROUTINE W REFLEX MICROSCOPIC
Bilirubin Urine: NEGATIVE
GLUCOSE, UA: NEGATIVE mg/dL
Hgb urine dipstick: NEGATIVE
Ketones, ur: 15 mg/dL — AB
LEUKOCYTES UA: NEGATIVE
NITRITE: NEGATIVE
PH: 5 (ref 5.0–8.0)
Protein, ur: NEGATIVE mg/dL
SPECIFIC GRAVITY, URINE: 1.017 (ref 1.005–1.030)
Urobilinogen, UA: 0.2 mg/dL (ref 0.0–1.0)

## 2015-07-31 LAB — CBC WITH DIFFERENTIAL/PLATELET
BASOS PCT: 1 % (ref 0–1)
Basophils Absolute: 0.2 10*3/uL — ABNORMAL HIGH (ref 0.0–0.1)
Eosinophils Absolute: 0.2 10*3/uL (ref 0.0–0.7)
Eosinophils Relative: 2 % (ref 0–5)
HEMATOCRIT: 46.3 % (ref 39.0–52.0)
HEMOGLOBIN: 15.6 g/dL (ref 13.0–17.0)
LYMPHS ABS: 1.3 10*3/uL (ref 0.7–4.0)
Lymphocytes Relative: 11 % — ABNORMAL LOW (ref 12–46)
MCH: 30.8 pg (ref 26.0–34.0)
MCHC: 33.7 g/dL (ref 30.0–36.0)
MCV: 91.3 fL (ref 78.0–100.0)
MONOS PCT: 9 % (ref 3–12)
Monocytes Absolute: 1 10*3/uL (ref 0.1–1.0)
NEUTROS ABS: 9.4 10*3/uL — AB (ref 1.7–7.7)
NEUTROS PCT: 77 % (ref 43–77)
Platelets: 300 10*3/uL (ref 150–400)
RBC: 5.07 MIL/uL (ref 4.22–5.81)
RDW: 14.9 % (ref 11.5–15.5)
WBC: 12.1 10*3/uL — AB (ref 4.0–10.5)

## 2015-07-31 LAB — COMPREHENSIVE METABOLIC PANEL
ALK PHOS: 89 U/L (ref 38–126)
ALT: 50 U/L (ref 17–63)
AST: 49 U/L — AB (ref 15–41)
Albumin: 3.9 g/dL (ref 3.5–5.0)
Anion gap: 11 (ref 5–15)
BUN: 27 mg/dL — AB (ref 6–20)
CALCIUM: 8.9 mg/dL (ref 8.9–10.3)
CHLORIDE: 101 mmol/L (ref 101–111)
CO2: 21 mmol/L — AB (ref 22–32)
CREATININE: 1.36 mg/dL — AB (ref 0.61–1.24)
GFR calc non Af Amer: 58 mL/min — ABNORMAL LOW (ref >60)
Glucose, Bld: 121 mg/dL — ABNORMAL HIGH (ref 65–99)
Potassium: 5 mmol/L (ref 3.5–5.1)
SODIUM: 133 mmol/L — AB (ref 135–145)
Total Bilirubin: 0.9 mg/dL (ref 0.3–1.2)
Total Protein: 6.9 g/dL (ref 6.5–8.1)

## 2015-07-31 LAB — BRAIN NATRIURETIC PEPTIDE: B NATRIURETIC PEPTIDE 5: 579.7 pg/mL — AB (ref 0.0–100.0)

## 2015-07-31 LAB — LIPASE, BLOOD: Lipase: 22 U/L (ref 22–51)

## 2015-07-31 LAB — I-STAT TROPONIN, ED: Troponin i, poc: 0.03 ng/mL (ref 0.00–0.08)

## 2015-07-31 MED ORDER — TAMSULOSIN HCL 0.4 MG PO CAPS: 0.4000 mg | ORAL_CAPSULE | Freq: Every day | ORAL | Status: DC

## 2015-07-31 MED ORDER — KETOROLAC TROMETHAMINE 15 MG/ML IJ SOLN
15.0000 mg | Freq: Once | INTRAMUSCULAR | Status: AC
  Administered 2015-07-31: 15 mg via INTRAVENOUS
  Filled 2015-07-31: qty 1

## 2015-07-31 MED ORDER — HYDROMORPHONE HCL 1 MG/ML IJ SOLN
1.0000 mg | Freq: Once | INTRAMUSCULAR | Status: AC
  Administered 2015-07-31: 1 mg via INTRAVENOUS
  Filled 2015-07-31: qty 1

## 2015-07-31 MED ORDER — ONDANSETRON HCL 4 MG PO TABS: 4.0000 mg | ORAL_TABLET | Freq: Once | ORAL | Status: DC

## 2015-07-31 MED ORDER — ONDANSETRON HCL 4 MG PO TABS: 4.0000 mg | ORAL_TABLET | Freq: Four times a day (QID) | ORAL | Status: DC | PRN

## 2015-07-31 MED ORDER — OXYCODONE-ACETAMINOPHEN 5-325 MG PO TABS: 1.0000 | ORAL_TABLET | Freq: Four times a day (QID) | ORAL | Status: DC | PRN

## 2015-07-31 MED ORDER — ONDANSETRON HCL 4 MG/2ML IJ SOLN
4.0000 mg | Freq: Once | INTRAMUSCULAR | Status: AC
  Administered 2015-07-31: 4 mg via INTRAVENOUS
  Filled 2015-07-31: qty 2

## 2015-07-31 NOTE — ED Provider Notes (Signed)
CSN: [id number]     Arrival date & time 07/31/15  1640 History   First MD Initiated Contact with Patient 07/31/15 1826     Chief Complaint  Patient presents with  . Shortness of Breath  . Vomiting  . Flank Pain     (Consider location/radiation/quality/duration/timing/severity/associated sxs/prior Treatment) HPI  Glenn Gonzalez is a 54 y.o. male with PMH significant for CHF, afib, permanent pacemaker who presents with right flank pain and associated nausea and vomiting after taking a second dose of his 40 mg Lasix earlier today. He states he woke up 2 pounds heavier this morning and thought he was retaining fluid, so he took 1 tablet of his 40 mg Lasix. However he was unable to pee after 6 hours, so he took a second dose for a total of 80 mg today. Twenty minutes after taking his second dose of Lasix he noticed right flank pain and shortly after started experiencing nausea, vomiting, and SOB. He describes the pain as dull that absent flows. He has had 2 episodes of vomiting.  Denies hematemesis or coffee-ground emesis, however he did say he had spaghetti for lunch. Denies fevers, chills, chest pain, or urinary symptoms. Patient denies tobacco and alcohol use. He does report a family history of kidney stones.  Past Medical History  Diagnosis Date  . CHF (congestive heart failure)   . A-fib   . GERD (gastroesophageal reflux disease)   . Asthma   . Pacemaker 2011    [place] device/2011  . Hypothyroidism   . Depression   . Pneumonia     recurrent pneumonia sedf.  rad tx for lymphoma  . Hodgkin's lymphoma 1980    IIIB. 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   Past Surgical History  Procedure Laterality Date  . Neck biopses      x5  . Cholecystectomy    . Exploratory laparotomy    . Permanent pacemaker  2011    [employer] COGNIS device  . Gynemastia    . Portacath placement      06-29-15 inserted, now removed.  . Tonsillectomy    . Cardiac  catheterization N/A 04/20/2015    Procedure: Right/Left Heart Cath and Coronary Angiography;  Surgeon: Jolaine Artist, MD;  Location: [facility];  Service: Cardiovascular;  Laterality: N/A;   Family History  Problem Relation Age of Onset  . Hyperthyroidism Mother   . Insomnia Mother   . Hypertension Father   . Depression Father   . Insomnia Father    Social History  Substance Use Topics  . Smoking status: Never Smoker   . Smokeless tobacco: None  . Alcohol Use: No    Review of Systems    Allergies  Digoxin; Milk-related compounds; Penicillins; and Spironolactone  Home Medications   Prior to Admission medications   Medication Sig Start Date End Date Taking? Authorizing Provider  acetaminophen (TYLENOL) 650 MG CR tablet Take 650 mg by mouth at bedtime.    Historical Provider, MD  carvedilol (COREG) 12.5 MG tablet Take 1 tablet (12.5 mg total) by mouth 2 (two) times daily with a meal. 05/31/15   Jolaine Artist, MD  cetirizine (ZYRTEC) 10 MG tablet Take 10 mg by mouth daily.    Historical Provider, MD  furosemide (LASIX) 40 MG tablet Take 40 mg by mouth daily as needed for fluid.     Historical Provider, MD  levothyroxine (SYNTHROID, LEVOTHROID) 125 MCG tablet Take 125 mcg  by mouth daily. 01/23/15   Historical Provider, MD  mupirocin ointment (BACTROBAN) 2 % Apply 1 application topically 2 (two) times daily.  07/17/15   Historical Provider, MD  RABEprazole (ACIPHEX) 20 MG tablet Take 1 tablet (20 mg total) by mouth 2 (two) times daily. 03/07/11   Shaune Pascal Bensimhon, MD  sacubitril-valsartan (ENTRESTO) 24-26 MG Take 1 tablet by mouth 2 (two) times daily. 07/25/15   Shaune Pascal Bensimhon, MD   BP 103/70 mmHg  Pulse 64  Temp(Src) 97.8 F (36.6 C) (Tympanic)  Resp 20  Ht 5\' 8"  (1.727 m)  Wt 150 lb (68.04 kg)  BMI 22.81 kg/m2  SpO2 98% Physical Exam  Constitutional: He is oriented to person, place, and time. He appears well-developed and well-nourished. He appears  distressed.  HENT:  Head: Normocephalic and atraumatic.  Mouth/Throat: Oropharynx is clear and moist.  Neck: Normal range of motion. Neck supple.  Cardiovascular: Normal rate, regular rhythm and normal heart sounds.   No murmur heard. No lower extremity edema bilaterally.   Pulmonary/Chest: Effort normal and breath sounds normal. No respiratory distress. He has no wheezes. He has no rales.  Abdominal: Soft. Bowel sounds are normal. He exhibits no distension. There is tenderness in the right upper quadrant and right lower quadrant. There is no rigidity, no rebound, no guarding and no CVA tenderness.  Musculoskeletal: Normal range of motion.  Lymphadenopathy:    He has no cervical adenopathy.  Neurological: He is alert and oriented to person, place, and time.  Psychiatric: He has a normal mood and affect. His behavior is normal.    ED Course  Procedures (including critical care time) Labs Review Labs Reviewed  CBC WITH DIFFERENTIAL/PLATELET - Abnormal; Notable for the following:    WBC 12.1 (*)    Neutro Abs 9.4 (*)    Lymphocytes Relative 11 (*)    Basophils Absolute 0.2 (*)    All other components within normal limits  BRAIN NATRIURETIC PEPTIDE - Abnormal; Notable for the following:    B Natriuretic Peptide 579.7 (*)    All other components within normal limits  COMPREHENSIVE METABOLIC PANEL - Abnormal; Notable for the following:    Sodium 133 (*)    CO2 21 (*)    Glucose, Bld 121 (*)    BUN 27 (*)    Creatinine, Ser 1.36 (*)    AST 49 (*)    GFR calc non Af Amer 58 (*)    All other components within normal limits  LIPASE, BLOOD  URINALYSIS, ROUTINE W REFLEX MICROSCOPIC (NOT AT [facility])  Randolm Idol, ED    Imaging Review Dg Chest 2 View  07/31/2015   CLINICAL DATA:  Right kidney pain, shortness of Breath  EXAM: CHEST  2 VIEW  COMPARISON:  06/10/2010  FINDINGS: Left AICD remains in place, unchanged. Heart is borderline in size. No confluent airspace opacities, effusions  or edema. No acute bony abnormality.  IMPRESSION: No active cardiopulmonary disease.   Electronically Signed   By: Rolm Baptise M.D.   On: 07/31/2015 17:41   I have personally reviewed and evaluated these images and lab results as part of my medical decision-making.   EKG Interpretation   Date/Time:  Tuesday July 31 2015 17:02:05 EDT Ventricular Rate:  81 PR Interval:    QRS Duration: 156 QT Interval:  446 QTC Calculation: 518 R Axis:   -107 Text Interpretation:  Ventricular-paced rhythm Biventricular pacemaker  detected Abnormal ECG No significant change since last tracing Confirmed  by Darl Householder  MD, DAVID ([id number]) on 07/31/2015 6:42:45 PM      MDM   Final diagnoses:  Right flank pain   Patient presents with sudden right flank pain with associated nausea and vomiting after 80 mg lasix today. Chest x-ray shows no edema or effusions. Troponin negative. On exam patient is tender to palpation in the right upper and right lower quadrants. No CVA tenderness. Pain control in ED. Encourage PO fluids. Patient has been unable to urinate since 10 AM this morning. Pending UA. Pending renal stone study.  Consider in and out cath given pt's inability to urinate.  I suspect nephrolithiasis and outpatient follow up with urology after PO pain control is established.        Gloriann Loan, PA-C 07/31/15 2022  Wandra Arthurs, MD 08/01/15 Berniece Salines

## 2015-07-31 NOTE — Discharge Instructions (Signed)

## 2015-07-31 NOTE — ED Provider Notes (Signed)
2340 - Patient care assumed from [facility], PA-C at shift change. Patient has been diagnosed with a kidney stone via CT. Pain well controlled with Dilaudid in ED. There is no evidence of significant hydronephrosis, vitals sign stable, and the pt does not have irratractable vomiting. No evidence of UTI on UA. Patient will be discharged home with pain medications and has been advised to follow up with PCP. Urology follow up and referral also recommended, especially if symptoms persist. Return precautions given at discharged. Patient discharged in good condition.   Filed Vitals:   07/31/15 2130 07/31/15 2145 07/31/15 2200 07/31/15 2315  BP: 118/76 118/74 120/70 120/80  Pulse: 85 86 86 88  Temp:      TempSrc:      Resp: 12 12 16 16   Height:      Weight:      SpO2: 95% 95% 96% 96%    Results for orders placed or performed during the hospital encounter of 07/31/15  CBC with Differential  Result Value Ref Range   WBC 12.1 (H) 4.0 - 10.5 K/uL   RBC 5.07 4.22 - 5.81 MIL/uL   Hemoglobin 15.6 13.0 - 17.0 g/dL   HCT 46.3 39.0 - 52.0 %   MCV 91.3 78.0 - 100.0 fL   MCH 30.8 26.0 - 34.0 pg   MCHC 33.7 30.0 - 36.0 g/dL   RDW 14.9 11.5 - 15.5 %   Platelets 300 150 - 400 K/uL   Neutrophils Relative % 77 43 - 77 %   Neutro Abs 9.4 (H) 1.7 - 7.7 K/uL   Lymphocytes Relative 11 (L) 12 - 46 %   Lymphs Abs 1.3 0.7 - 4.0 K/uL   Monocytes Relative 9 3 - 12 %   Monocytes Absolute 1.0 0.1 - 1.0 K/uL   Eosinophils Relative 2 0 - 5 %   Eosinophils Absolute 0.2 0.0 - 0.7 K/uL   Basophils Relative 1 0 - 1 %   Basophils Absolute 0.2 (H) 0.0 - 0.1 K/uL  Brain natriuretic peptide  Result Value Ref Range   B Natriuretic Peptide 579.7 (H) 0.0 - 100.0 pg/mL  Comprehensive metabolic panel  Result Value Ref Range   Sodium 133 (L) 135 - 145 mmol/L   Potassium 5.0 3.5 - 5.1 mmol/L   Chloride 101 101 - 111 mmol/L   CO2 21 (L) 22 - 32 mmol/L   Glucose, Bld 121 (H) 65 - 99 mg/dL   BUN 27 (H) 6 - 20 mg/dL    Creatinine, Ser 1.36 (H) 0.61 - 1.24 mg/dL   Calcium 8.9 8.9 - 10.3 mg/dL   Total Protein 6.9 6.5 - 8.1 g/dL   Albumin 3.9 3.5 - 5.0 g/dL   AST 49 (H) 15 - 41 U/L   ALT 50 17 - 63 U/L   Alkaline Phosphatase 89 38 - 126 U/L   Total Bilirubin 0.9 0.3 - 1.2 mg/dL   GFR calc non Af Amer 58 (L) >60 mL/min   GFR calc Af Amer >60 >60 mL/min   Anion gap 11 5 - 15  Urinalysis, Routine w reflex microscopic (not at [facility])  Result Value Ref Range   Color, Urine YELLOW YELLOW   APPearance HAZY (A) CLEAR   Specific Gravity, Urine 1.017 1.005 - 1.030   pH 5.0 5.0 - 8.0   Glucose, UA NEGATIVE NEGATIVE mg/dL   Hgb urine dipstick NEGATIVE NEGATIVE   Bilirubin Urine NEGATIVE NEGATIVE   Ketones, ur 15 (A) NEGATIVE mg/dL   Protein, ur NEGATIVE  NEGATIVE mg/dL   Urobilinogen, UA 0.2 0.0 - 1.0 mg/dL   Nitrite NEGATIVE NEGATIVE   Leukocytes, UA NEGATIVE NEGATIVE  Lipase, blood  Result Value Ref Range   Lipase 22 22 - 51 U/L  I-stat troponin, ED  Result Value Ref Range   Troponin i, poc 0.03 0.00 - 0.08 ng/mL   Comment 3           Dg Chest 2 View  07/31/2015   CLINICAL DATA:  Right kidney pain, shortness of Breath  EXAM: CHEST  2 VIEW  COMPARISON:  06/10/2010  FINDINGS: Left AICD remains in place, unchanged. Heart is borderline in size. No confluent airspace opacities, effusions or edema. No acute bony abnormality.  IMPRESSION: No active cardiopulmonary disease.   Electronically Signed   By: Rolm Baptise M.D.   On: 07/31/2015 17:41   Ct Renal Stone Study  07/31/2015   CLINICAL DATA:  Patient with right flank pain.  Nausea and vomiting.  EXAM: CT ABDOMEN AND PELVIS WITHOUT CONTRAST  TECHNIQUE: Multidetector CT imaging of the abdomen and pelvis was performed following the standard protocol without IV contrast.  COMPARISON:  None.  FINDINGS: Lower chest: Multiple pacer leads are demonstrated. Small pericardial effusion. Dependent atelectasis right lower lobe.  Hepatobiliary: Liver is normal in size and  contour. Gallbladder is unremarkable.  Pancreas: Upper abdominal surgical clips adjacent to the pancreas. Otherwise the pancreatic parenchyma is unremarkable.  Spleen: Status post splenectomy.  Adrenals/Urinary Tract: The adrenal glands are normal. 3 mm nonobstructing stone versus calcification within the superior pole of the left kidney which is markedly atrophic. Additional probable nonobstructing 2 mm stone inferior pole left kidney. There is a 9 mm nonobstructing stone inferior pole right kidney and adjacent anterior 6 mm nonobstructing stone inferior pole right kidney. There is moderate right hydroureteronephrosis to the level of the UVJ were there is an obstructing 3 mm stone. Urinary bladder is unremarkable. No left ureterolithiasis.  Stomach/Bowel: Descending colon is decompressed however there is small amount of surrounding fat stranding (image 37; series 2). No evidence for bowel obstruction. No free intraperitoneal air. Small amount of fluid in the distal esophagus.  Vascular/Lymphatic: Normal caliber abdominal aorta. Multiple periaortic surgical clips.  Other: Prostate unremarkable.  Musculoskeletal: Multiple small sclerotic foci demonstrated within the pelvis nonspecific, potentially representing bone islands. No aggressive or acute appearing osseous lesions.  IMPRESSION: Obstructing 3 mm stone within the distal right ureter with moderate right hydroureteronephrosis.  Decompressed descending colon. Adjacent to the proximal descending colon there is fat stranding which is nonspecific however may be secondary to focal colitis at this location. If not previously performed, recommend correlation with colonoscopy upon resolution of acute symptomatology.  Additional bilateral nephrolithiasis.  Atrophic superior pole left kidney.  Small pericardial effusion.   Electronically Signed   By: Lovey Newcomer M.D.   On: 07/31/2015 21:52       Antonietta Breach, PA-C 07/31/15 2344  Wandra Arthurs, MD 08/01/15 [phone]

## 2015-07-31 NOTE — Telephone Encounter (Signed)
Patient called CHF clinic triage line c/o SOB starting yesterday.  He did not take Entresto this morning and instead took 80mg  lasix (only takes 40mg  prn).  Now patient states SOB is a little better but he has not "made any water" and now his right kidney hurts.  Patient states he took this much lasix because he gained 2 lbs since yesterday, "it has to be fluid".  No swelling noted.  Spoke with Dr. Haroldine Laws who instructs patient to only take 40mg  lasix at a time when he feels it is needed, and that kidney pain may be associated with taking a lot of lasix without excess water to get rid of.  Advised patient to drink glass of water and not to take any more lasix today.  Also advised it is not necessary to hold Entresto when taking lasix.  If pain gets worse he should report to ED.  Asked to call us back in am to update [place] with his s/s.  Patient aware and agreeable.  Renee Pain

## 2015-07-31 NOTE — ED Notes (Signed)
Pt sts increased SOB today; pt sts took lasix today without relief; pt sts kidney pain with N/V; pt requests no nausea meds at present

## 2015-07-31 NOTE — Progress Notes (Signed)
Transplant eval referral packet faxed to Dr. Artemio Aly at [facility] per Dr. Haroldine Laws.

## 2015-08-02 NOTE — H&P (Signed)
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 In February 2016 his weight was around 202 and then it dropped down to 197. No reports of diarrhea, hematochezia, melena, nausea, vomiting, or abdominal pain. He had a colonoscopy with Glenn Gonzalez 10 years ago and he had a negative experience with the sedation. He wanted the procedure without sedation and Glenn Gonzalez sedated him. The patient's EF is at 10%, but functionally he can walk one mile. However, there is a possibility of a cardiac transplant. As for his weight loss, it has stabilized this past month. He started on a zero sodium diet two years ago and he has steadily lost weight until this past month. No known family history of colon cancer.  Past Medical History  Diagnosis Date  . CHF (congestive heart failure)   . A-fib   . GERD (gastroesophageal reflux disease)   . Asthma   . Pacemaker 2011    [place] device/2011  . Hypothyroidism   . Depression   . Pneumonia     recurrent pneumonia sedf.  rad tx for lymphoma  . Hodgkin's lymphoma 1980    IIIB. 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    Past Surgical History  Procedure Laterality Date  . Neck biopses      x5  . Cholecystectomy    . Exploratory laparotomy    . Permanent pacemaker  2011    [employer] COGNIS device  . Gynemastia    . Portacath placement      06-29-15 inserted, now removed.  . Tonsillectomy    . Cardiac catheterization N/A 04/20/2015    Procedure: Right/Left Heart Cath and Coronary Angiography;  Surgeon: Glenn Artist, MD;  Location: [facility];  Service: Cardiovascular;  Laterality: N/A;    Family History  Problem Relation Age of Onset  . Hyperthyroidism Mother   . Insomnia Mother   . Hypertension Father   . Depression Father   . Insomnia Father     Social History:  reports that he has never smoked. He does not have any  smokeless tobacco history on file. He reports that he does not drink alcohol or use illicit drugs.  Allergies:  Allergies  Allergen Reactions  . Digoxin Other (See Comments)    gynecomastia   . Milk-Related Compounds     Cramps and diarrhea   . Penicillins     Cramps   . Spironolactone Other (See Comments)    Other Reaction: gynecomastia    Medications: Scheduled: Continuous:  No results found for this or any previous visit (from the past 24 hour(s)).   Dg Chest 2 View  07/31/2015   CLINICAL DATA:  Right kidney pain, shortness of Breath  EXAM: CHEST  2 VIEW  COMPARISON:  06/10/2010  FINDINGS: Left AICD remains in place, unchanged. Heart is borderline in size. No confluent airspace opacities, effusions or edema. No acute bony abnormality.  IMPRESSION: No active cardiopulmonary disease.   Electronically Signed   By: Glenn Gonzalez M.D.   On: 07/31/2015 17:41   Ct Renal Stone Study  07/31/2015   CLINICAL DATA:  Patient with right flank pain.  Nausea and vomiting.  EXAM: CT ABDOMEN AND PELVIS WITHOUT CONTRAST  TECHNIQUE: Multidetector CT imaging of the abdomen and pelvis was performed following the standard protocol without IV contrast.  COMPARISON:  None.  FINDINGS: Lower chest: Multiple pacer  leads are demonstrated. Small pericardial effusion. Dependent atelectasis right lower lobe.  Hepatobiliary: Liver is normal in size and contour. Gallbladder is unremarkable.  Pancreas: Upper abdominal surgical clips adjacent to the pancreas. Otherwise the pancreatic parenchyma is unremarkable.  Spleen: Status post splenectomy.  Adrenals/Urinary Tract: The adrenal glands are normal. 3 mm nonobstructing stone versus calcification within the superior pole of the left kidney which is markedly atrophic. Additional probable nonobstructing 2 mm stone inferior pole left kidney. There is a 9 mm nonobstructing stone inferior pole right kidney and adjacent anterior 6 mm nonobstructing stone inferior pole right  kidney. There is moderate right hydroureteronephrosis to the level of the UVJ were there is an obstructing 3 mm stone. Urinary bladder is unremarkable. No left ureterolithiasis.  Stomach/Bowel: Descending colon is decompressed however there is small amount of surrounding fat stranding (image 37; series 2). No evidence for bowel obstruction. No free intraperitoneal air. Small amount of fluid in the distal esophagus.  Vascular/Lymphatic: Normal caliber abdominal aorta. Multiple periaortic surgical clips.  Other: Prostate unremarkable.  Musculoskeletal: Multiple small sclerotic foci demonstrated within the pelvis nonspecific, potentially representing bone islands. No aggressive or acute appearing osseous lesions.  IMPRESSION: Obstructing 3 mm stone within the distal right ureter with moderate right hydroureteronephrosis.  Decompressed descending colon. Adjacent to the proximal descending colon there is fat stranding which is nonspecific however may be secondary to focal colitis at this location. If not previously performed, recommend correlation with colonoscopy upon resolution of acute symptomatology.  Additional bilateral nephrolithiasis.  Atrophic superior pole left kidney.  Small pericardial effusion.   Electronically Signed   By: Glenn Gonzalez M.D.   On: 07/31/2015 21:52    ROS:  As stated above in the HPI otherwise negative.  There were no vitals taken for this visit.    PE: Gen: NAD, Alert and Oriented HEENT:  Glenn Gonzalez/AT, EOMI Neck: Supple, no LAD Lungs: CTA Bilaterally CV: RRR without M/G/R ABM: Soft, NTND, +BS Ext: No C/C/E  Assessment/Gonzalez: 1) Screening colonoscopy without sedation.  Glenn Gonzalez D 08/02/2015, 7:34 AM

## 2015-08-03 ENCOUNTER — Encounter (HOSPITAL_COMMUNITY)
Admission: RE | Disposition: A | Discharge status: Home / Self Care | Payer: Self-pay | Source: Ambulatory Visit | Attending: Gastroenterology

## 2015-08-03 ENCOUNTER — Encounter (HOSPITAL_COMMUNITY): Payer: Self-pay

## 2015-08-03 ENCOUNTER — Ambulatory Visit (HOSPITAL_COMMUNITY)
Admission: RE | Admit: 2015-08-03 | Discharge: 2015-08-03 | Disposition: A | Discharge status: Home / Self Care | Payer: Managed Care, Other (non HMO) | Source: Ambulatory Visit | Attending: Gastroenterology | Admitting: Gastroenterology

## 2015-08-03 DIAGNOSIS — D122 Benign neoplasm of ascending colon: Secondary | ICD-10-CM | POA: Diagnosis not present

## 2015-08-03 DIAGNOSIS — Z950 Presence of cardiac pacemaker: Secondary | ICD-10-CM | POA: Insufficient documentation

## 2015-08-03 DIAGNOSIS — D123 Benign neoplasm of transverse colon: Secondary | ICD-10-CM | POA: Insufficient documentation

## 2015-08-03 DIAGNOSIS — I4891 Unspecified atrial fibrillation: Secondary | ICD-10-CM | POA: Insufficient documentation

## 2015-08-03 DIAGNOSIS — J45909 Unspecified asthma, uncomplicated: Secondary | ICD-10-CM | POA: Insufficient documentation

## 2015-08-03 DIAGNOSIS — K219 Gastro-esophageal reflux disease without esophagitis: Secondary | ICD-10-CM | POA: Insufficient documentation

## 2015-08-03 DIAGNOSIS — Z1211 Encounter for screening for malignant neoplasm of colon: Secondary | ICD-10-CM | POA: Insufficient documentation

## 2015-08-03 DIAGNOSIS — E039 Hypothyroidism, unspecified: Secondary | ICD-10-CM | POA: Insufficient documentation

## 2015-08-03 DIAGNOSIS — Z8571 Personal history of Hodgkin lymphoma: Secondary | ICD-10-CM | POA: Diagnosis not present

## 2015-08-03 DIAGNOSIS — D120 Benign neoplasm of cecum: Secondary | ICD-10-CM | POA: Diagnosis not present

## 2015-08-03 DIAGNOSIS — I509 Heart failure, unspecified: Secondary | ICD-10-CM | POA: Diagnosis not present

## 2015-08-03 HISTORY — PX: COLONOSCOPY WITH PROPOFOL: SHX5780

## 2015-08-03 SURGERY — COLONOSCOPY WITH PROPOFOL
Anesthesia: Moderate Sedation

## 2015-08-03 MED ORDER — SODIUM CHLORIDE 0.9 % IV SOLN
INTRAVENOUS | Status: DC
Start: 2015-08-03 — End: 2015-08-03

## 2015-08-03 SURGICAL SUPPLY — 22 items

## 2015-08-03 NOTE — Op Note (Signed)
[facility] [place], [address]   COLONOSCOPY PROCEDURE REPORT  PATIENT: Glenn Gonzalez, Glenn Gonzalez  MR#: [id number] BIRTHDATE: 1961/03/15 , 53  yrs. old GENDER: male ENDOSCOPIST: Carol Ada, MD REFERRED BY: PROCEDURE DATE:  08/30/15 PROCEDURE:   Colonoscopy with snare polypectomy ASA CLASS:   Class III INDICATIONS: Screening MEDICATIONS: None  DESCRIPTION OF PROCEDURE:   After the risks and benefits and of the procedure were explained, informed consent was obtained.  revealed no abnormalities of the rectum.    The Pentax Ped Colon [id number] endoscope was introduced through the anus and advanced to the cecum, which was identified by both the appendix and ileocecal valve .  The quality of the prep was excellent. .  The instrument was then slowly withdrawn as the colon was fully examined. Estimated blood loss is zero unless otherwise noted in this procedure report.     FINDINGS: A total of 10 polyps were identified and removed with a cold snare from the cecum, ascending, and proximal transverse colon.  The polyps ranged in size from 2-4 mm.     Retroflexed views revealed no abnormalities.     The scope was then withdrawn from the patient and the procedure completed.  WITHDRAWAL TIME: 29 minutes  COMPLICATIONS: There were no immediate complications. ENDOSCOPIC IMPRESSION: 1) Colonic polyps.  RECOMMENDATIONS: 1) Follow up biopsy results. 2) Repeat the colonoscopy in 1-3 years.  REPEAT EXAM:  cc:  _______________________________ eSignedCarol Ada, MD August 30, 2015 9:53 AM   CPT CODES: ICD CODES:  The ICD and CPT codes recommended by this software are interpretations from the data that the clinical staff has captured with the software.  The verification of the translation of this report to the ICD and CPT codes and modifiers is the sole responsibility of the health care institution and practicing physician where this report was  generated.  [employer]. will not be held responsible for the validity of the ICD and CPT codes included on this report.  AMA assumes no liability for data contained or not contained herein. CPT is a Designer, television/film set of the [employer].   PATIENT NAME:  [name], [name] MR#: [id number]

## 2015-08-03 NOTE — Discharge Instructions (Signed)

## 2015-08-05 ENCOUNTER — Encounter (HOSPITAL_COMMUNITY): Payer: Self-pay | Admitting: Gastroenterology

## 2015-08-13 ENCOUNTER — Telehealth: Payer: Self-pay | Admitting: Cardiology

## 2015-08-13 NOTE — Telephone Encounter (Signed)
New message      FYI Need update plan of care on pt---faxing form.

## 2015-08-14 NOTE — Telephone Encounter (Signed)
Form received and given to Medical Records.

## 2015-09-06 ENCOUNTER — Encounter: Payer: Self-pay | Admitting: Infectious Diseases

## 2015-09-06 NOTE — Progress Notes (Signed)
Colonoscopy report and pathology faxed to Transplant Coordinator at [facility] Loyce Dys @ [phone])

## 2015-09-07 ENCOUNTER — Encounter (HOSPITAL_COMMUNITY): Payer: Self-pay | Admitting: Internal Medicine

## 2015-09-07 ENCOUNTER — Ambulatory Visit (HOSPITAL_COMMUNITY)
Admission: RE | Admit: 2015-09-07 | Discharge: 2015-09-07 | Disposition: A | Discharge status: Home / Self Care | Payer: Managed Care, Other (non HMO) | Source: Ambulatory Visit | Attending: Internal Medicine | Admitting: Internal Medicine

## 2015-09-07 VITALS — HR 92 | Wt 199.1 lb

## 2015-09-07 DIAGNOSIS — I4891 Unspecified atrial fibrillation: Secondary | ICD-10-CM | POA: Diagnosis not present

## 2015-09-07 DIAGNOSIS — Z8571 Personal history of Hodgkin lymphoma: Secondary | ICD-10-CM | POA: Insufficient documentation

## 2015-09-07 DIAGNOSIS — E039 Hypothyroidism, unspecified: Secondary | ICD-10-CM | POA: Insufficient documentation

## 2015-09-07 DIAGNOSIS — J45909 Unspecified asthma, uncomplicated: Secondary | ICD-10-CM | POA: Diagnosis not present

## 2015-09-07 DIAGNOSIS — Z79899 Other long term (current) drug therapy: Secondary | ICD-10-CM | POA: Insufficient documentation

## 2015-09-07 DIAGNOSIS — Z95810 Presence of automatic (implantable) cardiac defibrillator: Secondary | ICD-10-CM | POA: Insufficient documentation

## 2015-09-07 DIAGNOSIS — I5022 Chronic systolic (congestive) heart failure: Secondary | ICD-10-CM | POA: Insufficient documentation

## 2015-09-07 DIAGNOSIS — I428 Other cardiomyopathies: Secondary | ICD-10-CM | POA: Diagnosis not present

## 2015-09-07 DIAGNOSIS — K219 Gastro-esophageal reflux disease without esophagitis: Secondary | ICD-10-CM | POA: Insufficient documentation

## 2015-09-07 NOTE — Patient Instructions (Signed)
Your physician recommends that you schedule a follow-up appointment in: 2-3 months

## 2015-09-07 NOTE — Addendum Note (Signed)
Encounter addended by: Kerry Dory, CMA on: 09/07/2015  3:38 PM<BR>     Documentation filed: Patient Instructions Section

## 2015-09-07 NOTE — Progress Notes (Signed)
ADVANCED HF CLINIC NOTE  Patient ID: MURTAZA DIOS, male   DOB: August 26, 1961, 54 y.o.   MRN: [id number] PCP: Dr Osborne Casco AHF Cardiologist: Bensimhon  HPI: Ed is a 54 y/o male with long h/o CHF due to NICM (? related to chemo/XRT) with EF 25% s/p CRT-D [facility])  PMHx notable for Hodgkins lymphona (treated age 34-23) Treated with high dose chemo - including adriamycin and XRT. Also has AF, VT, PUD and asthma. Initial EF 13%. Previously seen at [facility] transplant clinic and felt to be doing too well to require Tx. Cath with normal coronaries. Has not been able to tolerate spironolactone or Inspra due to painful gynecomastia requiring surgery. Stopped statin due to myalgias.  Follow up for HF: Was seen by Dr. Jerilee Hoh at [facility] in June. Felt to still be too early for transplant. We decided on close f/u and repeating in CPX . CPX in July with pVO2 18.8 - 50% of predicted (down from 21).  Since that time has been seen at [facility] as well as by Dr. Mosetta Pigeon at [facility]. [place] recommended consideration of TAH. [facility] beginning transplant w/u and attempt to list as a 2. Overall doing better on Entresto taking 3/4 of a 24/26mg  pill bid. Feels better. SBP ~90. Able to work out every day. Some days doesn't feel as good though. Yesterday had some chest pressure and took a lasix which dropped his BP into 70. Better today. Weight up a couple pounds.   Blood type O+  Echo 5/16 EF 15% RV ok  Studies:  R & LHC 6/16 Normal coronaries RA = 8 RV = 43/2/12 PA = 47/19 (30) PCW = 23 with v waves to 35 Fick cardiac output/index = 4.4/2.1 Thermo CO/CI = 3.3/1.6 PVR = 1.5 SVR = 1261 FA sat = 97% PA sat = 60%, 63%   07/02/2011 ECHO 25% 05/2012 ECHO EF 25%  10/2012 Dr [name] reprogrammed BiV device due to PMT NSVT 08/12/13 EF 20-25% 5/16 Echo EF 15% RV normal  CPX 04/2011:  pVO2 17.3 (59% predicted) VeVCo2 slope 38.4 RER 1.14  CPX 02/28/13  PVO2 19 (60% predicted) VEVCO2 slope 31. RER1.16  CPX  01/04/14 FVC 3.70 (67%)  FEV1 2.96 (69%)  Peak VO2: 22.5 (64.6% predicted peak VO2) VE/VCO2 slope: 26.3 Peak RER: 1.17  CPX 02/02/15 HR 61-> 100 BP 80/56 -> 106/58 PVO2 21.2 (62% predicted) VEVCO2 slope 34. RER1.16  CPX 06/28/15 Resting HR: 68 Peak HR: 102  (61% age predicted max HR) BP rest: 92/66 BP peak: 100/58 Peak VO2: 18.8 (54.6% predicted peak VO2) VE/VCO2 slope: 33.3 OUES: 1.68 Peak RER: 1.13 Ventilatory Threshold: 11.8 (34.3% predicted or measured peak VO2) VE/MVV: 45.4% O2pulse: 17  (89.5% predicted O2pulse)  Labs  02/04/13 224  01/25/13 Pro BNP 2660 08/31/13 K 4.1 Creatinine 0.93 Pro BNP 1929  10/05/13 K 5.0 Creatinine 1.7 Pro BNP 250 01/09/14  K 4.2 Cr 1.14 9/15 K 4.9, creatinine 1.18, HCT 46.9, TSH normal   ROS: All systems negative except as listed in HPI, PMH and Problem List.  Past Medical History  Diagnosis Date  . CHF (congestive heart failure) ([place])   . A-fib ([place])   . GERD (gastroesophageal reflux disease)   . Asthma   . Pacemaker 2011    [place] device/2011  . Hypothyroidism   . Depression   . Pneumonia     recurrent pneumonia sedf.  rad tx for lymphoma  . Hodgkin's lymphoma ([place]) 1980    IIIB.  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    Current Outpatient Prescriptions  Medication Sig Dispense Refill  . acetaminophen (TYLENOL) 650 MG CR tablet Take 650 mg by mouth at bedtime.    . carvedilol (COREG) 12.5 MG tablet Take 1 tablet (12.5 mg total) by mouth 2 (two) times daily with a meal. 180 tablet 3  . cetirizine (ZYRTEC) 10 MG tablet Take 10 mg by mouth at bedtime.     . furosemide (LASIX) 40 MG tablet Take 40 mg by mouth daily as needed for fluid.     Marland Kitchen levothyroxine (SYNTHROID, LEVOTHROID) 125 MCG tablet Take 125 mcg by mouth daily.  3  . mupirocin ointment (BACTROBAN) 2 % Apply 1 application topically 2 (two) times daily.   2  . RABEprazole (ACIPHEX) 20 MG tablet Take 1 tablet (20 mg total) by mouth 2 (two)  times daily. 180 tablet 3  . sacubitril-valsartan (ENTRESTO) 24-26 MG Take 0.75 tablets by mouth 2 (two) times daily.     No current facility-administered medications for this encounter.    Filed Vitals:   09/07/15 1440  Pulse: 92  Weight: 199 lb 1.9 oz (90.32 kg)  SpO2: 97%    PHYSICAL EXAM: General:  Well appearing. No resp difficulty  HEENT: normal Neck: supple. JVP flat . Carotids 2+ bilaterally; no bruits. No lymphadenopathy or thryomegaly appreciated. Cor: PMI normal. Regular rate & rhythm. No rubs, gallops or murmurs. Lungs: clear Abdomen: soft, nontender, nondistended. No hepatosplenomegaly. No bruits or masses. Good bowel sounds. Extremities: no cyanosis, clubbing, rash, no edema Neuro: alert & orientedx3, cranial nerves grossly intact. Moves all 4 extremities w/o difficulty. Affect pleasant.   ASSESSMENT & PLAN:  1. Chronic systolic CHF: NICM, EF 0000000 (04/2015), thought to be from chemo (adriamycin), s/p [employer] CRT-D. CPX 3/16 Peak VO2: 18.8 (54% predicted peak VO2). VE/VCO2 slope: 33 --Continues with NYHA III (and occasionally IIIB) symptoms. Recent CPX worse but still may not be clearly in window for Tx.  BP remains quite soft. Given young age and good activity level in past would use 50% of predicted pVO2 as target cutoff for advanced therapies.  --Given O+ status have encouraged him to pursue dual transplant listing at [facility] and [place]. I would be reluctant to consider TAH unless absolutely necessary.  2. Palpitations - resolved with reprogramming of ICD 3. History of lymphoma treated ( Age 52-23)  with adriamycin 4. Hypothyroid: On synthroid. Per PCP Dr Osborne Casco.  5. Atrial tachycardia:  This was found on ICD interrogation by Dr [name].  Detection threshold for tachyarrhythmias decreased to 130 bpm to make detection of AT episodes easier.  6. Blood type O+   [name] MD 09/07/2015

## 2015-09-07 NOTE — Progress Notes (Signed)
Advanced Heart Failure Medication Review by a Pharmacist  Does the patient  feel that his/her medications are working for him/her?  yes  Has the patient been experiencing any side effects to the medications prescribed?  no  Does the patient measure his/her own blood pressure or blood glucose at home?  yes   Does the patient have any problems obtaining medications due to transportation or finances?   no  Understanding of regimen: good Understanding of indications: good Potential of compliance: good Patient understands to avoid NSAIDs. Patient understands to avoid decongestants.  Issues to address at subsequent visits: None   Pharmacist comments:  Glenn Gonzalez is a 54 yo M presenting without a medication list but able to verbalize each of his medications including dosages to me. He states that he needed to take 20 mg of lasix yesterday 2/2 chest pressure. He is also only taking 3/4 of an Entresto tablet BID for soft blood pressures. He did not have any other medication-related questions or concerns for me at this time.   Ruta Hinds. Velva Harman, PharmD, BCPS, CPP Clinical Pharmacist Pager: [phone] Phone: [phone] 09/07/2015 2:55 PM    Time with patient: 4 minutes Preparation and documentation time: 2 minutes Total time: 6 minutes

## 2015-09-20 ENCOUNTER — Other Ambulatory Visit (HOSPITAL_COMMUNITY): Payer: Self-pay | Admitting: Infectious Diseases

## 2015-09-20 ENCOUNTER — Telehealth (HOSPITAL_COMMUNITY): Payer: Self-pay | Admitting: Infectious Diseases

## 2015-09-20 DIAGNOSIS — R935 Abnormal findings on diagnostic imaging of other abdominal regions, including retroperitoneum: Secondary | ICD-10-CM

## 2015-09-20 DIAGNOSIS — Z01810 Encounter for preprocedural cardiovascular examination: Secondary | ICD-10-CM

## 2015-09-20 NOTE — Telephone Encounter (Signed)
A user error has taken place: encounter opened in error, closed for administrative reasons.

## 2015-09-27 ENCOUNTER — Telehealth (HOSPITAL_COMMUNITY): Payer: Self-pay

## 2015-09-27 NOTE — Telephone Encounter (Signed)
Has two large kidney stones that will be removed in 2 weeks.  In the mean time his urologist has put him on tramadol.  Patient wants to make sure that this is not harmful to is heart before he starts taking  Routed to [place]

## 2015-09-27 NOTE — Telephone Encounter (Signed)
Pharmacy student, Richie, spoke with patient about concerns with use of tramadol. Based on a review of his history and current medication regimen, this should not be a problem for him to take. Patient verbalized understanding.   Ruta Hinds. Velva Harman, PharmD, BCPS, CPP Clinical Pharmacist Pager: [phone] Phone: [phone] 09/27/2015 2:53 PM

## 2015-09-28 ENCOUNTER — Telehealth (HOSPITAL_COMMUNITY): Payer: Self-pay

## 2015-09-28 NOTE — Telephone Encounter (Signed)
Patient called and stated that he needed clearance for surgical removal of kidney stones and asked [place] to contact Dr. Alyson Ingles, [facility].  Called office left message to call back for more information

## 2015-10-01 ENCOUNTER — Other Ambulatory Visit: Payer: Self-pay | Admitting: Urology

## 2015-10-03 NOTE — Progress Notes (Signed)
Spoke with chasity, pt ok for same day labs per dr Alyson Ingles

## 2015-10-08 ENCOUNTER — Encounter (HOSPITAL_COMMUNITY): Payer: Self-pay | Admitting: *Deleted

## 2015-10-08 ENCOUNTER — Telehealth (HOSPITAL_COMMUNITY): Payer: Self-pay | Admitting: Vascular Surgery

## 2015-10-08 NOTE — Telephone Encounter (Signed)
Chasity from [facility] called she has been waiting on a clearance for pt surgery on Friday please call her bacck [phone] ext [phone]

## 2015-10-10 NOTE — Telephone Encounter (Signed)
Dr. Haroldine Laws is calling [facility] to talk to the surgeon about this patient

## 2015-10-11 ENCOUNTER — Telehealth (HOSPITAL_COMMUNITY): Payer: Self-pay

## 2015-10-11 HISTORY — PX: CARDIAC CATHETERIZATION: SHX172

## 2015-10-11 NOTE — Progress Notes (Signed)
Cardiac clearance note dr bensimon on chart for 10-12-15 surgery

## 2015-10-11 NOTE — Progress Notes (Signed)
Ok per [employer] for dr Alyson Ingles for pt to be same day

## 2015-10-11 NOTE — Telephone Encounter (Signed)
Pre-op Clearance form sent to [facility] at [place] [phone])  Telephone: [phone] ext [phone]

## 2015-10-12 ENCOUNTER — Encounter (HOSPITAL_COMMUNITY): Payer: Self-pay | Admitting: *Deleted

## 2015-10-12 ENCOUNTER — Ambulatory Visit (HOSPITAL_COMMUNITY): Payer: Managed Care, Other (non HMO) | Admitting: Anesthesiology

## 2015-10-12 ENCOUNTER — Encounter (HOSPITAL_COMMUNITY)
Admission: RE | Disposition: A | Discharge status: Home / Self Care | Payer: Self-pay | Source: Ambulatory Visit | Attending: Urology

## 2015-10-12 ENCOUNTER — Ambulatory Visit (HOSPITAL_COMMUNITY)
Admission: RE | Admit: 2015-10-12 | Discharge: 2015-10-12 | Disposition: A | Discharge status: Home / Self Care | Payer: Managed Care, Other (non HMO) | Source: Ambulatory Visit | Attending: Urology | Admitting: Urology

## 2015-10-12 DIAGNOSIS — Z87442 Personal history of urinary calculi: Secondary | ICD-10-CM | POA: Diagnosis not present

## 2015-10-12 DIAGNOSIS — I4891 Unspecified atrial fibrillation: Secondary | ICD-10-CM | POA: Diagnosis not present

## 2015-10-12 DIAGNOSIS — Z8571 Personal history of Hodgkin lymphoma: Secondary | ICD-10-CM | POA: Diagnosis not present

## 2015-10-12 DIAGNOSIS — E039 Hypothyroidism, unspecified: Secondary | ICD-10-CM | POA: Insufficient documentation

## 2015-10-12 DIAGNOSIS — N200 Calculus of kidney: Secondary | ICD-10-CM | POA: Insufficient documentation

## 2015-10-12 DIAGNOSIS — R109 Unspecified abdominal pain: Secondary | ICD-10-CM | POA: Diagnosis present

## 2015-10-12 DIAGNOSIS — I509 Heart failure, unspecified: Secondary | ICD-10-CM | POA: Diagnosis not present

## 2015-10-12 DIAGNOSIS — Z79899 Other long term (current) drug therapy: Secondary | ICD-10-CM | POA: Diagnosis not present

## 2015-10-12 DIAGNOSIS — K219 Gastro-esophageal reflux disease without esophagitis: Secondary | ICD-10-CM | POA: Insufficient documentation

## 2015-10-12 DIAGNOSIS — J45909 Unspecified asthma, uncomplicated: Secondary | ICD-10-CM | POA: Diagnosis not present

## 2015-10-12 HISTORY — PX: CYSTOSCOPY WITH RETROGRADE PYELOGRAM, URETEROSCOPY AND STENT PLACEMENT: SHX5789

## 2015-10-12 HISTORY — PX: STONE EXTRACTION WITH BASKET: SHX5318

## 2015-10-12 SURGERY — CYSTOURETEROSCOPY, WITH RETROGRADE PYELOGRAM AND STENT INSERTION
Anesthesia: General | Laterality: Right

## 2015-10-12 MED ORDER — CIPROFLOXACIN IN D5W 400 MG/200ML IV SOLN
INTRAVENOUS | Status: DC | PRN
  Administered 2015-10-12: 400 mg via INTRAVENOUS

## 2015-10-12 MED ORDER — TRAMADOL HCL 50 MG PO TABS: ORAL_TABLET | ORAL | Status: DC

## 2015-10-12 MED ORDER — MIDAZOLAM HCL 5 MG/5ML IJ SOLN
INTRAMUSCULAR | Status: DC | PRN
  Administered 2015-10-12: 2 mg via INTRAVENOUS

## 2015-10-12 MED ORDER — IOHEXOL 300 MG/ML  SOLN
INTRAMUSCULAR | Status: DC | PRN
  Administered 2015-10-12: 10 mL

## 2015-10-12 MED ORDER — CIPROFLOXACIN IN D5W 400 MG/200ML IV SOLN
INTRAVENOUS | Status: AC
  Filled 2015-10-12: qty 200

## 2015-10-12 MED ORDER — DEXAMETHASONE SODIUM PHOSPHATE 10 MG/ML IJ SOLN
INTRAMUSCULAR | Status: AC
  Filled 2015-10-12: qty 1

## 2015-10-12 MED ORDER — FENTANYL CITRATE (PF) 100 MCG/2ML IJ SOLN
INTRAMUSCULAR | Status: AC
  Filled 2015-10-12: qty 4

## 2015-10-12 MED ORDER — CEFAZOLIN SODIUM-DEXTROSE 2-3 GM-% IV SOLR
INTRAVENOUS | Status: AC
  Filled 2015-10-12: qty 50

## 2015-10-12 MED ORDER — FENTANYL CITRATE (PF) 100 MCG/2ML IJ SOLN
INTRAMUSCULAR | Status: AC
  Filled 2015-10-12: qty 2

## 2015-10-12 MED ORDER — ETOMIDATE 2 MG/ML IV SOLN
INTRAVENOUS | Status: AC
  Filled 2015-10-12: qty 10

## 2015-10-12 MED ORDER — PROMETHAZINE HCL 25 MG/ML IJ SOLN: 6.2500 mg | INTRAMUSCULAR | Status: DC | PRN

## 2015-10-12 MED ORDER — PHENYLEPHRINE HCL 10 MG/ML IJ SOLN
10.0000 mg | INTRAVENOUS | Status: DC | PRN
  Administered 2015-10-12: 25 ug/min via INTRAVENOUS

## 2015-10-12 MED ORDER — ONDANSETRON HCL 4 MG/2ML IJ SOLN
INTRAMUSCULAR | Status: AC
  Filled 2015-10-12: qty 2

## 2015-10-12 MED ORDER — CEFAZOLIN SODIUM-DEXTROSE 2-3 GM-% IV SOLR: 2.0000 g | INTRAVENOUS | Status: DC

## 2015-10-12 MED ORDER — GLYCOPYRROLATE 0.2 MG/ML IJ SOLN
INTRAMUSCULAR | Status: DC | PRN
  Administered 2015-10-12: 0.2 mg via INTRAVENOUS

## 2015-10-12 MED ORDER — PROPOFOL 10 MG/ML IV BOLUS
INTRAVENOUS | Status: AC
  Filled 2015-10-12: qty 20

## 2015-10-12 MED ORDER — LIDOCAINE HCL (CARDIAC) 20 MG/ML IV SOLN
INTRAVENOUS | Status: DC | PRN
  Administered 2015-10-12: 50 mg via INTRAVENOUS

## 2015-10-12 MED ORDER — MIDAZOLAM HCL 2 MG/2ML IJ SOLN
INTRAMUSCULAR | Status: AC
  Filled 2015-10-12: qty 4

## 2015-10-12 MED ORDER — LACTATED RINGERS IV SOLN
INTRAVENOUS | Status: DC | PRN
  Administered 2015-10-12: 08:00:00 via INTRAVENOUS

## 2015-10-12 MED ORDER — ACETAMINOPHEN 500 MG PO TABS
1000.0000 mg | ORAL_TABLET | Freq: Once | ORAL | Status: AC
  Administered 2015-10-12: 1000 mg via ORAL

## 2015-10-12 MED ORDER — FENTANYL CITRATE (PF) 100 MCG/2ML IJ SOLN
INTRAMUSCULAR | Status: DC | PRN
  Administered 2015-10-12: 50 ug via INTRAVENOUS

## 2015-10-12 MED ORDER — ACETAMINOPHEN 500 MG PO TABS
ORAL_TABLET | ORAL | Status: AC
  Filled 2015-10-12: qty 2

## 2015-10-12 MED ORDER — ONDANSETRON HCL 4 MG/2ML IJ SOLN
INTRAMUSCULAR | Status: DC | PRN
  Administered 2015-10-12: 4 mg via INTRAVENOUS

## 2015-10-12 MED ORDER — FENTANYL CITRATE (PF) 100 MCG/2ML IJ SOLN
25.0000 ug | INTRAMUSCULAR | Status: DC | PRN
Start: 2015-10-12 — End: 2015-10-12
  Administered 2015-10-12: 25 ug via INTRAVENOUS

## 2015-10-12 MED ORDER — ETOMIDATE 2 MG/ML IV SOLN
INTRAVENOUS | Status: DC | PRN
  Administered 2015-10-12: 14 mg via INTRAVENOUS

## 2015-10-12 SURGICAL SUPPLY — 15 items
BAG URO CATCHER STRL LF (DRAPE) ×3 IMPLANT
CATH INTERMIT  6FR 70CM (CATHETERS) ×3 IMPLANT
CLOTH BEACON ORANGE TIMEOUT ST (SAFETY) ×3 IMPLANT
DRSG MEPILEX BORDER 4X4 (GAUZE/BANDAGES/DRESSINGS) ×2 IMPLANT
EXTRACTOR STONE NITINOL NGAGE (UROLOGICAL SUPPLIES) ×2 IMPLANT
GLOVE BIOGEL M STRL SZ7.5 (GLOVE) ×3 IMPLANT
GOWN STRL REUS W/TWL LRG LVL3 (GOWN DISPOSABLE) ×6 IMPLANT
GUIDEWIRE ANG ZIPWIRE 038X150 (WIRE) ×2 IMPLANT
GUIDEWIRE STR DUAL SENSOR (WIRE) ×3 IMPLANT
MANIFOLD NEPTUNE II (INSTRUMENTS) ×3 IMPLANT
PACK CYSTO (CUSTOM PROCEDURE TRAY) ×3 IMPLANT
SHEATH ACCESS URETERAL 38CM (SHEATH) ×2 IMPLANT
STENT CONTOUR 6FRX26X.038 (STENTS) ×2 IMPLANT
TUBING CONNECTING 10 (TUBING) ×2 IMPLANT
TUBING CONNECTING 10' (TUBING) ×1

## 2015-10-12 NOTE — Anesthesia Postprocedure Evaluation (Signed)
  Anesthesia Post-op Note  Patient: Glenn Gonzalez  Procedure(s) Performed: Procedure(s) (LRB): CYSTOSCOPY WITH RETROGRADE PYELOGRAM, URETEROSCOPY AND STENT PLACEMENT (Right) STONE EXTRACTION WITH BASKET (Right)  Patient Location: PACU  Anesthesia Type: General  Level of Consciousness: awake and alert   Airway and Oxygen Therapy: Patient Spontanous Breathing  Post-op Pain: mild  Post-op Assessment: Post-op Vital signs reviewed, Patient's Cardiovascular Status Stable, Respiratory Function Stable, Patent Airway and No signs of Nausea or vomiting. No complaints in PACU. No dyspnea.  Last Vitals:  Filed Vitals:   10/12/15 1205  BP: 103/65  Pulse: 74  Temp:   Resp: 18    Post-op Vital Signs: stable   Complications: No apparent anesthesia complications

## 2015-10-12 NOTE — Anesthesia Preprocedure Evaluation (Signed)
Anesthesia Evaluation  Patient identified by MRN, date of birth, ID band Patient awake    Reviewed: Allergy & Precautions, NPO status , Patient's Chart, lab work & pertinent test results  Airway Mallampati: II  TM Distance: >3 FB Neck ROM: Full    Dental no notable dental hx.    Pulmonary shortness of breath, asthma , pneumonia, resolved,    Pulmonary exam normal breath sounds clear to auscultation       Cardiovascular Exercise Tolerance: Good +CHF  Normal cardiovascular exam+ pacemaker  Rhythm:Regular Rate:Normal  Chronic CHF secondary to adriamycin therapy 30 years ago. Being evaluated for TAH and heart transplant. Seen by [facility] cardiology yesterday. Cath done, no CAD and cardiac index 2.5 per patient.  Followed by Dr. Haroldine Laws and cleared for surgery.  No symptoms today.  AICD in place (St. Jude)   Neuro/Psych PSYCHIATRIC DISORDERS Depression negative neurological ROS     GI/Hepatic Neg liver ROS, GERD  Medicated,  Endo/Other  Hypothyroidism   Renal/GU negative Renal ROS  negative genitourinary   Musculoskeletal negative musculoskeletal ROS (+)   Abdominal   Peds negative pediatric ROS (+)  Hematology negative hematology ROS (+)   Anesthesia Other Findings   Reproductive/Obstetrics negative OB ROS                             Anesthesia Physical Anesthesia Plan  ASA: IV  Anesthesia Plan: General   Post-op Pain Management:    Induction: Intravenous  Airway Management Planned: LMA  Additional Equipment:   Intra-op Plan:   Post-operative Plan: Extubation in OR  Informed Consent: I have reviewed the patients History and Physical, chart, labs and discussed the procedure including the risks, benefits and alternatives for the proposed anesthesia with the patient or authorized representative who has indicated his/her understanding and acceptance.   Dental advisory  given  Plan Discussed with: CRNA  Anesthesia Plan Comments: (Discussed R/B/A of general and spinal. Since a spinal might require a T-8 level I don't think it is advantageous over general.   Will follow AICD prescription.)        Anesthesia Quick Evaluation

## 2015-10-12 NOTE — Transfer of Care (Signed)
Immediate Anesthesia Transfer of Care Note  Patient: Glenn Gonzalez  Procedure(s) Performed: Procedure(s): CYSTOSCOPY WITH RETROGRADE PYELOGRAM, URETEROSCOPY AND STENT PLACEMENT (Right) STONE EXTRACTION WITH BASKET (Right)  Patient Location: PACU  Anesthesia Type:General  Level of Consciousness:  sedated, patient cooperative and responds to stimulation  Airway & Oxygen Therapy:Patient Spontanous Breathing and Patient connected to face mask oxgen  Post-op Assessment:  Report given to PACU RN and Post -op Vital signs reviewed and stable  Post vital signs:  Reviewed and stable  Last Vitals:  Filed Vitals:   10/12/15 0705  BP: 93/59  Pulse: 72  Temp: 36.4 C  Resp: 18    Complications: No apparent anesthesia complications

## 2015-10-12 NOTE — Brief Op Note (Signed)
10/12/2015  10:11 AM  PATIENT:  Glenn Gonzalez  54 y.o. male  PRE-OPERATIVE DIAGNOSIS:  RIGHT RENAL LITHIASIS  POST-OPERATIVE DIAGNOSIS:  right renal lithiasis  PROCEDURE:  Procedure(s): CYSTOSCOPY WITH RETROGRADE PYELOGRAM, URETEROSCOPY AND STENT PLACEMENT (Right) STONE EXTRACTION WITH BASKET (Right)  SURGEON:  Surgeon(s) and Role:    * Cleon Gustin, MD - Primary  PHYSICIAN ASSISTANT:   ASSISTANTS: none   ANESTHESIA:   general  EBL:  Total I/O In: 150 [I.V.:150] Out: -   BLOOD ADMINISTERED:none  DRAINS: right 6 x 26 JJ stent  LOCAL MEDICATIONS USED:  NONE  SPECIMEN:  Source of Specimen:  stone  DISPOSITION OF SPECIMEN:  N/A  COUNTS:  YES  TOURNIQUET:  * No tourniquets in log *  DICTATION: .Note written in EPIC  PLAN OF CARE: Discharge to home after PACU  PATIENT DISPOSITION:  PACU - hemodynamically stable.   Delay start of Pharmacological VTE agent (>24hrs) due to surgical blood loss or risk of bleeding: not applicable

## 2015-10-12 NOTE — H&P (Signed)
Urology Admission H&P  Chief Complaint: right flank pain  History of Present Illness: Mr Sever is a 54yo with a hx of right renal stones here for stone extraction. He has CHF and is being considered for transplant. His stones cause intermittent right flank pain and microhematuria  Past Medical History  Diagnosis Date  . CHF (congestive heart failure) ([place])   . A-fib ([place])   . GERD (gastroesophageal reflux disease)   . Asthma   . Pacemaker 2011    [place] device/2011  . Hypothyroidism   . Depression   . Hodgkin's lymphoma ([place]) 1980    IIIB. 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  . Pneumonia     recurrent pneumonia sedf.  rad tx for lymphoma   Past Surgical History  Procedure Laterality Date  . Neck biopses      x5  . Exploratory laparotomy    . Permanent pacemaker  2011    [employer] COGNIS device  . Gynemastia Bilateral   . Portacath placement      06-29-15 inserted, now removed.  . Tonsillectomy    . Cardiac catheterization N/A 04/20/2015    Procedure: Right/Left Heart Cath and Coronary Angiography;  Surgeon: Jolaine Artist, MD;  Location: [place] [facility];  Service: Cardiovascular;  Laterality: N/A;  . Colonoscopy with propofol N/A 08/03/2015    Procedure: COLONOSCOPY WITH PROPOFOL;  Surgeon: Carol Ada, MD;  Location: [facility];  Service: Endoscopy;  Laterality: N/A;  wants to try to do procedure without sedation  . Cardiac catheterization  10-11-15    pt states routine heart cath to be [facility] at [facility] Medications:  Prescriptions prior to admission  Medication Sig Dispense Refill Last Dose  . acetaminophen (TYLENOL) 650 MG CR tablet Take 1,300 mg by mouth at bedtime.    10/11/2015 at PM  . carvedilol (COREG) 12.5 MG tablet Take 1 tablet (12.5 mg total) by mouth 2 (two) times daily with a meal. 180 tablet 3 10/12/2015 at 0715  . cetirizine (ZYRTEC) 10 MG tablet Take 10 mg by mouth at bedtime.    10/11/2015 at PM   . furosemide (LASIX) 40 MG tablet Take 40 mg by mouth daily as needed for fluid.    2 weeks ago  . levothyroxine (SYNTHROID, LEVOTHROID) 125 MCG tablet Take 125 mcg by mouth daily.  3 10/12/2015 at 0715  . RABEprazole (ACIPHEX) 20 MG tablet Take 1 tablet (20 mg total) by mouth 2 (two) times daily. 180 tablet 3 10/12/2015 at 0715  . sacubitril-valsartan (ENTRESTO) 24-26 MG Take 0.75 tablets by mouth 2 (two) times daily.   10/11/2015 at PM  . traMADol (ULTRAM) 50 MG tablet TAKE 1 TABLET BY MOUTH EVERY 4 TO 6 HOURS AS NEEDED FOR PAIN (Patient taking differently: TAKE 1-2 TABLET BY MOUTH EVERY 4 HOURS AS NEEDED FOR PAIN) 30 tablet 0 1 week ago   Allergies:  Allergies  Allergen Reactions  . Digoxin Other (See Comments)    gynecomastia   . Milk-Related Compounds     Cramps and diarrhea   . Penicillins     Cramps Has patient had a PCN reaction causing immediate rash, facial/tongue/throat swelling, SOB or lightheadedness with hypotension: No Has patient had a PCN reaction causing severe rash involving mucus membranes or skin necrosis: No Has patient had a PCN reaction that required hospitalization No Has patient had a PCN reaction occurring within the last 10 years: No If all  of the above answers are "NO", then may proceed with Cephalosporin use.    Marland Kitchen Spironolactone Other (See Comments)    Other Reaction: gynecomastia    Family History  Problem Relation Age of Onset  . Hyperthyroidism Mother   . Insomnia Mother   . Hypertension Father   . Depression Father   . Insomnia Father    Social History:  reports that he has never smoked. He has never used smokeless tobacco. He reports that he does not drink alcohol or use illicit drugs.  Review of Systems  All other systems reviewed and are negative.   Physical Exam:  Vital signs in last 24 hours: Temp:  [97.6 F (36.4 C)] 97.6 F (36.4 C) (11/11 0705) Pulse Rate:  [72] 72 (11/11 0705) Resp:  [18] 18 (11/11 0705) BP: (93)/(59) 93/59  mmHg (11/11 0705) SpO2:  [100 %] 100 % (11/11 0705) Weight:  [90.039 kg (198 lb 8 oz)] 90.039 kg (198 lb 8 oz) (11/11 0729) Physical Exam  Constitutional: He is oriented to person, place, and time. He appears well-developed and well-nourished.  HENT:  Head: Normocephalic and atraumatic.  Eyes: EOM are normal. Pupils are equal, round, and reactive to light.  Neck: Normal range of motion. No thyromegaly present.  Cardiovascular: Normal rate and regular rhythm.   Respiratory: Effort normal. No respiratory distress.  GI: Soft. He exhibits no distension.  Musculoskeletal: Normal range of motion.  Neurological: He is alert and oriented to person, place, and time.  Skin: Skin is warm and dry.  Psychiatric: He has a normal mood and affect. His behavior is normal. Judgment and thought content normal.    Laboratory Data:  No results found for this or any previous visit (from the past 24 hour(s)). No results found for this or any previous visit (from the past 240 hour(s)). Creatinine: No results for input(s): CREATININE in the last 168 hours. Baseline Creatinine: unknown  Impression/Assessment:  53yo with right nephrolithiasis  Plan:  The risks/benefits/alternatives to cysto, right retrograde, right stone manipulation was explained to the patient and he understands and wishes to proceed with surgery  Iyari Hagner L 10/12/2015, 9:19 AM

## 2015-10-12 NOTE — Op Note (Signed)
Preoperative diagnosis: Right renal stones  Postoperative diagnosis: Same  Procedure: 1 cystoscopy 2 right retrograde pyelography 3.  Intraoperative fluoroscopy, under one hour, with interpretation 4.  Right ureteroscopic stone manipulation with basket extraction 5.  Right 6 x 26 JJ stent placement  Attending: Nicolette Bang  Anesthesia: General  Estimated blood loss: None  Drains: Right 6 x 26 JJ ureteral stent without tether  Specimens: stone  Antibiotics: cipro  Findings: numerous tiny right renal calculi, over 100. No hydronephrosis. No masses/lesions in the bladder. Ureteral orifices in normal anatomic location.  Indications: Patient is a 54 year old male with a history of right stones and who has persistent right flank pain.  After discussing treatment options, he decided proceed with right ureteroscopic stone manipulation.  Procedure her in detail: The patient was brought to the operating room and a brief timeout was done to ensure correct patient, correct procedure, correct site.  General anesthesia was administered patient was placed in dorsal lithotomy position.  Her genitalia was then prepped and draped in usual sterile fashion.  A rigid 3 French cystoscope was passed in the urethra and the bladder.  Bladder was inspected free masses or lesions.  the right ureteral orifices were in the normal orthotopic locations.  a 6 french ureteral catheter was then instilled into the right ureter orifice.  a gentle retrograde was obtained and findings noted above.  we then placed a zip wire through the ureteral catheter and advanced up to the renal pelvis.  we then removed the cystoscope and cannulated the right ureteral orifice with a semirigid ureteroscope.  No stone was found in the ureter. Once we reached the UPJ a sensor wire was advanced in to the renal pelvis. We then removed the ureteroscope and advanced a 12/14 x 38cm access sheath up to the renal pelvis. We then usd a flexible  ureteroscope to perform nephroscopy. We encountered over 100 tiny stones. The larger 2-82mm stones were removed with an NGage basket. We then flushes the upper, mid, and pole poles with saline to remove the stones through the access sheath. A majority of the stones were removed. We then removed the sheath under direct vision and noted no injury to the ureter.we then placed a 6 x 26 double-j ureteral stent over the original zip wire. We removed the wire and good coil was noted in the the renal pelvis under fluoroscopy and the bladder under direct vision.    the bladder was then drained and this concluded the procedure which was well tolerated by patient.  Complications: None  Condition: Stable, extubated, transferred to PACU  Plan: Patient is to be discharged home as to follow-up in one week for stent removal.

## 2015-10-17 ENCOUNTER — Ambulatory Visit (INDEPENDENT_AMBULATORY_CARE_PROVIDER_SITE_OTHER): Payer: Managed Care, Other (non HMO) | Admitting: Internal Medicine

## 2015-10-17 ENCOUNTER — Encounter: Payer: Self-pay | Admitting: Internal Medicine

## 2015-10-17 VITALS — BP 74/50 | HR 60 | Ht 72.0 in | Wt 203.8 lb

## 2015-10-17 DIAGNOSIS — I5022 Chronic systolic (congestive) heart failure: Secondary | ICD-10-CM | POA: Diagnosis not present

## 2015-10-17 DIAGNOSIS — I471 Supraventricular tachycardia: Secondary | ICD-10-CM

## 2015-10-17 DIAGNOSIS — Z95810 Presence of automatic (implantable) cardiac defibrillator: Secondary | ICD-10-CM | POA: Diagnosis not present

## 2015-10-17 LAB — CUP PACEART INCLINIC DEVICE CHECK
Brady Statistic RV Percent Paced: 98 %
Date Time Interrogation Session: 20161116050000
HighPow Impedance: 52 Ohm
Implantable Lead Implant Date: 20030925
Implantable Lead Implant Date: 20030925
Implantable Lead Location: 753859
Implantable Lead Location: 753860
Implantable Lead Model: 5076
Implantable Lead Serial Number: 309118
Lead Channel Impedance Value: 529 Ohm
Lead Channel Pacing Threshold Amplitude: 0.7 V
Lead Channel Pacing Threshold Amplitude: 0.7 V
Lead Channel Pacing Threshold Pulse Width: 0.4 ms
Lead Channel Sensing Intrinsic Amplitude: 1.2 mV
Lead Channel Sensing Intrinsic Amplitude: 20.8 mV
Lead Channel Setting Pacing Amplitude: 1.2 V
Lead Channel Setting Pacing Amplitude: 2.1 V
Lead Channel Setting Pacing Pulse Width: 0.4 ms
Lead Channel Setting Pacing Pulse Width: 0.8 ms
Lead Channel Setting Sensing Sensitivity: 1 mV
MDC IDC LEAD IMPLANT DT: 20030925
MDC IDC LEAD LOCATION: 753858
MDC IDC LEAD MODEL: 158
MDC IDC LEAD MODEL: 4513
MDC IDC LEAD SERIAL: 122241
MDC IDC MSMT LEADCHNL LV IMPEDANCE VALUE: 583 Ohm
MDC IDC MSMT LEADCHNL LV PACING THRESHOLD PULSEWIDTH: 0.8 ms
MDC IDC MSMT LEADCHNL LV SENSING INTR AMPL: 11 mV
MDC IDC MSMT LEADCHNL RA IMPEDANCE VALUE: 426 Ohm
MDC IDC MSMT LEADCHNL RV PACING THRESHOLD AMPLITUDE: 0.7 V
MDC IDC MSMT LEADCHNL RV PACING THRESHOLD PULSEWIDTH: 0.4 ms
MDC IDC PG SERIAL: 588967
MDC IDC SET LEADCHNL RA PACING AMPLITUDE: 2 V
MDC IDC SET LEADCHNL RV SENSING SENSITIVITY: 0.6 mV
MDC IDC STAT BRADY RA PERCENT PACED: 1 % — AB

## 2015-10-17 NOTE — Patient Instructions (Signed)
Medication Instructions:  Your physician recommends that you continue on your current medications as directed. Please refer to the Current Medication list given to you today.   Labwork: none  Testing/Procedures: none  Follow-Up: Your physician recommends that you schedule a follow-up appointment in: 3 months.   Your physician wants you to follow-up in: 12 months.  You will receive a reminder letter in the mail two months in advance. If you don't receive a letter, please call our office to schedule the follow-up appointment.   Any Other Special Instructions Will Be Listed Below (If Applicable).     If you need a refill on your cardiac medications before your next appointment, please call your pharmacy.

## 2015-10-17 NOTE — Progress Notes (Signed)
Patient Care Team: Haywood Pao, MD as PCP - General (Internal Medicine)   HPI  Glenn Gonzalez is a 54 y.o. 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 He is s/p CRT-D  He is followed by the heart failure clinic.  He has not had any intercurrent defibrillator discharges  He is followed by the heart failure clinic and has recently been listed class II for transplant  Most recent laboratory not he had a creatinine of 1.18 potassium 4.9   Past Medical History  Diagnosis Date  . CHF (congestive heart failure) ([place])   . A-fib ([place])   . GERD (gastroesophageal reflux disease)   . Asthma   . Pacemaker 2011    [place] device/2011  . Hypothyroidism   . Depression   . Hodgkin's lymphoma ([place]) 1980    IIIB. 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  . Pneumonia     recurrent pneumonia sedf.  rad tx for lymphoma    Past Surgical History  Procedure Laterality Date  . Neck biopses      x5  . Exploratory laparotomy    . Permanent pacemaker  2011    boston scientific COGNIS device  . Gynemastia Bilateral   . Portacath placement      06-29-15 inserted, now removed.  . Tonsillectomy    . Cardiac catheterization N/A 04/20/2015    Procedure: Right/Left Heart Cath and Coronary Angiography;  Surgeon: Jolaine Artist, MD;  Location: [facility];  Service: Cardiovascular;  Laterality: N/A;  . Colonoscopy with propofol N/A 08/03/2015    Procedure: COLONOSCOPY WITH PROPOFOL;  Surgeon: Carol Ada, MD;  Location: [facility];  Service: Endoscopy;  Laterality: N/A;  wants to try to do procedure without sedation  . Cardiac catheterization  10-11-15    pt states routine heart cath to be done at [place]   . Cystoscopy with retrograde pyelogram, ureteroscopy and stent placement Right 10/12/2015   Procedure: [place], URETEROSCOPY AND STENT PLACEMENT;  Surgeon: Cleon Gustin, MD;  Location: [facility];  Service: Urology;  Laterality: Right;  . Stone extraction with basket Right 10/12/2015    Procedure: STONE EXTRACTION WITH BASKET;  Surgeon: Cleon Gustin, MD;  Location: [facility];  Service: Urology;  Laterality: Right;    Current Outpatient Prescriptions  Medication Sig Dispense Refill  . acetaminophen (TYLENOL) 650 MG CR tablet Take 1,300 mg by mouth at bedtime.     . carvedilol (COREG) 12.5 MG tablet Take 1 tablet (12.5 mg total) by mouth 2 (two) times daily with a meal. 180 tablet 3  . cetirizine (ZYRTEC) 10 MG tablet Take 10 mg by mouth at bedtime.     . diazepam (VALIUM) 5 MG tablet Take 1 tablet by mouth 3 (three) times daily as needed. Bladder spams    . furosemide (LASIX) 40 MG tablet Take 40 mg by mouth daily as needed for fluid.     Marland Kitchen levothyroxine (SYNTHROID, LEVOTHROID) 125 MCG tablet Take 125 mcg by mouth daily.  3  . RABEprazole (ACIPHEX) 20 MG tablet Take 1 tablet (20 mg total) by mouth 2 (two) times daily. 180 tablet 3  . sacubitril-valsartan (ENTRESTO) 24-26 MG Take 0.75 tablets by mouth 2 (two) times daily.    . traMADol (ULTRAM) 50 MG tablet TAKE 1 TABLET BY MOUTH  EVERY 4 TO 6 HOURS AS NEEDED FOR PAIN 30 tablet 0   No current facility-administered medications for this visit.    Allergies  Allergen Reactions  . Digoxin Other (See Comments)    gynecomastia   . Milk-Related Compounds     Cramps and diarrhea   . Penicillins     Cramps Has patient had a PCN reaction causing immediate rash, facial/tongue/throat swelling, SOB or lightheadedness with hypotension: No Has patient had a PCN reaction causing severe rash involving mucus membranes or skin necrosis: No Has patient had a PCN reaction that required hospitalization No Has patient had a PCN reaction occurring within the last 10 years: No If all of the above answers are "NO",  then may proceed with Cephalosporin use.    Marland Kitchen Spironolactone Other (See Comments)    Other Reaction: gynecomastia    Review of Systems negative except from HPI and PMH  Physical Exam BP 74/50 mmHg  Pulse 60  Ht 6' (1.829 m)  Wt 203 lb 12.8 oz (92.443 kg)  BMI 27.63 kg/m2  SpO2 98% Well developed and well nourished in no acute distress  HENT normal Neck supple with JVP-flat Clear Regular rate and rhythm, no murmurs or gallops Abd-soft with active BS No Clubbing cyanosis edema Skin-warm and dry A & Oriented  Grossly normal sensory and motor function  psynchronous pacing with a biventricular pacing pattern  Assessment and  Plan Nonischemic cardio myopathy  Congestive heart failure  chronic-systolic  Ventricular tachycardia  Implantable defibrillator-[employer]  The patient's device was interrogated and the information was fully reviewed.  The device was not reprogrammed Atrial tachycardia  Overall he has been doing quite well  He is now listed.  Device function is normal

## 2015-10-19 ENCOUNTER — Telehealth (HOSPITAL_COMMUNITY): Payer: Self-pay

## 2015-10-19 NOTE — Telephone Encounter (Signed)
Patient called back Informed him that the message has been sent to the doctor and pharmacist for input

## 2015-10-19 NOTE — Telephone Encounter (Signed)
Spoke with patient who states urologist wanted to start Urocit-K for kidney stones. His last K was 5.0 so probably not the best option. I recommended he call urologist again to see if he could recommend an alternative not containing potassium.

## 2015-10-19 NOTE — Telephone Encounter (Signed)
Patient called and inquired if it is safe for him to take Urocit-K 15 meq that his urologist prescribed [facility]onders if there is something else he can take besides this. LVMTCB Routed to [facility]

## 2015-10-22 ENCOUNTER — Encounter (HOSPITAL_COMMUNITY): Payer: Self-pay | Admitting: *Deleted

## 2015-10-22 NOTE — Progress Notes (Signed)
Received note from [facility], pt has been listed with the [place] [facility] for [employer] as a heart transplant candidate effective 10/19/15 as a status 2, Dr Haroldine Laws and [place] team aware

## 2015-11-08 ENCOUNTER — Encounter (HOSPITAL_COMMUNITY): Payer: Self-pay | Admitting: Internal Medicine

## 2015-11-08 ENCOUNTER — Ambulatory Visit (HOSPITAL_COMMUNITY)
Admission: RE | Admit: 2015-11-08 | Discharge: 2015-11-08 | Disposition: A | Discharge status: Home / Self Care | Payer: Managed Care, Other (non HMO) | Source: Ambulatory Visit | Attending: Internal Medicine | Admitting: Internal Medicine

## 2015-11-08 VITALS — BP 86/52 | HR 69 | Wt 206.0 lb

## 2015-11-08 DIAGNOSIS — Z8572 Personal history of non-Hodgkin lymphomas: Secondary | ICD-10-CM | POA: Diagnosis not present

## 2015-11-08 DIAGNOSIS — Z79899 Other long term (current) drug therapy: Secondary | ICD-10-CM | POA: Diagnosis not present

## 2015-11-08 DIAGNOSIS — Z7682 Awaiting organ transplant status: Secondary | ICD-10-CM | POA: Diagnosis not present

## 2015-11-08 DIAGNOSIS — Z6740 Type O blood, Rh positive: Secondary | ICD-10-CM | POA: Diagnosis not present

## 2015-11-08 DIAGNOSIS — K219 Gastro-esophageal reflux disease without esophagitis: Secondary | ICD-10-CM | POA: Insufficient documentation

## 2015-11-08 DIAGNOSIS — I5022 Chronic systolic (congestive) heart failure: Secondary | ICD-10-CM

## 2015-11-08 DIAGNOSIS — I471 Supraventricular tachycardia: Secondary | ICD-10-CM | POA: Insufficient documentation

## 2015-11-08 DIAGNOSIS — I428 Other cardiomyopathies: Secondary | ICD-10-CM | POA: Diagnosis not present

## 2015-11-08 DIAGNOSIS — Z9221 Personal history of antineoplastic chemotherapy: Secondary | ICD-10-CM | POA: Diagnosis not present

## 2015-11-08 DIAGNOSIS — Z95810 Presence of automatic (implantable) cardiac defibrillator: Secondary | ICD-10-CM | POA: Insufficient documentation

## 2015-11-08 DIAGNOSIS — E039 Hypothyroidism, unspecified: Secondary | ICD-10-CM | POA: Insufficient documentation

## 2015-11-08 NOTE — Addendum Note (Signed)
Encounter addended by: Scarlette Calico, RN on: 11/08/2015 10:01 AM<BR>     Documentation filed: Dx Association, Patient Instructions Section, Orders

## 2015-11-08 NOTE — Patient Instructions (Signed)
Your physician has recommended that you have a cardiopulmonary stress test (CPX). CPX testing is a non-invasive measurement of heart and lung function. It replaces a traditional treadmill stress test. This type of test provides a tremendous amount of information that relates not only to your present condition but also for future outcomes. This test combines measurements of you ventilation, respiratory gas exchange in the lungs, electrocardiogram (EKG), blood pressure and physical response before, during, and following an exercise protocol.  We will contact you in 3 months to schedule your next appointment.

## 2015-11-08 NOTE — Progress Notes (Signed)
ADVANCED HF CLINIC NOTE  Patient ID: Glenn Gonzalez, male   DOB: 1961-03-19, 54 y.o.   MRN: [id number] PCP: Dr Osborne Casco AHF Cardiologist: Malai Lady  HPI: Glenn Gonzalez is a 54 y/o male with long h/o CHF due to NICM (? related to chemo/XRT) with EF 25% s/p CRT-D [facility])  PMHx notable for Hodgkins lymphona (treated age 67-23) Treated with high dose chemo - including adriamycin and XRT. Also has AF, VT, PUD and asthma. Initial EF 13%. Previously seen at [facility] transplant clinic and felt to be doing too well to require Tx. Cath with normal coronaries. Has not been able to tolerate spironolactone or Inspra due to painful gynecomastia requiring surgery. Stopped statin due to myalgias.  Follow up for HF: Was seen by Dr. Mosetta Pigeon at [facility] and now on Transplant list as a 2. Had a [place] at [facility] on 10/11/15 with well compensated numbers. CPX in July with pVO2 18.8 - 50% of predicted (down from 21).  Continues to do 20 mins on elliptical per day. Does not do more because it wears him out for several days. SBP 80-90s. Now taking 80% of Entresto 24/26 tabs bid. . Taking lasix a couple times per week based on his weight.   Blood type O+  Echo 5/16 EF 15% RV ok  Studies:  R & LHC 6/16 Normal coronaries RA = 8 RV = 43/2/12 PA = 47/19 (30) PCW = 23 with v waves to 35 Fick cardiac output/index = 4.4/2.1 Thermo CO/CI = 3.3/1.6 PVR = 1.5 SVR = 1261 FA sat = 97% PA sat = 60%, 63%  [facility] at [facility] 10/11/15 RA 5 PA mean 24 PCWP 17 CI  2.4   07/02/2011 ECHO 25% 05/2012 ECHO EF 25%  10/2012 Dr [name] reprogrammed BiV device due to PMT NSVT 08/12/13 EF 20-25% 5/16 Echo EF 15% RV normal  CPX 04/2011:  pVO2 17.3 (59% predicted) VeVCo2 slope 38.4 RER 1.14  CPX 02/28/13  PVO2 19 (60% predicted) VEVCO2 slope 31. RER1.16  CPX 01/04/14 FVC 3.70 (67%)  FEV1 2.96 (69%)  Peak VO2: 22.5 (64.6% predicted peak VO2) VE/VCO2 slope: 26.3 Peak RER: 1.17  CPX 02/02/15 HR 61-> 100 BP 80/56 -> 106/58 PVO2 21.2 (62%  predicted) VEVCO2 slope 34. RER1.16  CPX 06/28/15 Resting HR: 68 Peak HR: 102  (61% age predicted max HR) BP rest: 92/66 BP peak: 100/58 Peak VO2: 18.8 (54.6% predicted peak VO2) VE/VCO2 slope: 33.3 OUES: 1.68 Peak RER: 1.13 Ventilatory Threshold: 11.8 (34.3% predicted or measured peak VO2) VE/MVV: 45.4% O2pulse: 17  (89.5% predicted O2pulse)  Labs  02/04/13 224  01/25/13 Pro BNP 2660 08/31/13 K 4.1 Creatinine 0.93 Pro BNP 1929  10/05/13 K 5.0 Creatinine 1.7 Pro BNP 250 01/09/14  K 4.2 Cr 1.14 9/15 K 4.9, creatinine 1.18, HCT 46.9, TSH normal   ROS: All systems negative except as listed in HPI, PMH and Problem List.  Past Medical History  Diagnosis Date  . CHF (congestive heart failure) ([place])   . A-fib ([place])   . GERD (gastroesophageal reflux disease)   . Asthma   . Pacemaker 2011    [place] device/2011  . Hypothyroidism   . Depression   . Hodgkin's lymphoma ([place]) 1980    IIIB. 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  . Pneumonia     recurrent pneumonia sedf.  rad tx for lymphoma    Current Outpatient Prescriptions  Medication Sig Dispense Refill  . acetaminophen (TYLENOL) 650 MG CR tablet  Take 1,300 mg by mouth at bedtime.     . carvedilol (COREG) 12.5 MG tablet Take 1 tablet (12.5 mg total) by mouth 2 (two) times daily with a meal. 180 tablet 3  . cetirizine (ZYRTEC) 10 MG tablet Take 10 mg by mouth at bedtime.     . furosemide (LASIX) 40 MG tablet Take 40 mg by mouth daily as needed for fluid.     Marland Kitchen levothyroxine (SYNTHROID, LEVOTHROID) 125 MCG tablet Take 125 mcg by mouth daily.  3  . RABEprazole (ACIPHEX) 20 MG tablet Take 1 tablet (20 mg total) by mouth 2 (two) times daily. 180 tablet 3  . sacubitril-valsartan (ENTRESTO) 24-26 MG Take 0.75 tablets by mouth 2 (two) times daily.     No current facility-administered medications for this encounter.    Filed Vitals:   11/08/15 0927  BP: 86/52  Pulse: 69  Weight: 206 lb (93.441  kg)  SpO2: 98%    PHYSICAL EXAM: General:  Well appearing. No resp difficulty  HEENT: normal Neck: supple. JVP flat . Carotids 2+ bilaterally; no bruits. No lymphadenopathy or thryomegaly appreciated. Cor: PMI normal. Regular rate & rhythm. No rubs, gallops or murmurs. Lungs: clear Abdomen: soft, nontender, nondistended. No hepatosplenomegaly. No bruits or masses. Good bowel sounds. Extremities: no cyanosis, clubbing, rash, no edema Neuro: alert & orientedx3, cranial nerves grossly intact. Moves all 4 extremities w/o difficulty. Affect pleasant.   ASSESSMENT & PLAN:  1. Chronic systolic CHF: NICM, EF 0000000 (04/2015), thought to be from chemo (adriamycin), s/p [employer] CRT-D. CPX 3/16 Peak VO2: 18.8 (54% predicted peak VO2). VE/VCO2 slope: 33 --Continues with stable NYHA III symptoms. Volume status looks good.  --Continue current meds. Try to take full tablet of Entresto 24/26 as tolerated --Now listed as a 2 on Transplant List at [facility]  Will repeat CPX in January prior to visit back at [facility].  2. Palpitations - resolved with reprogramming of ICD/CRT 3. History of lymphoma treated ( Age 49-23)  with adriamycin 4. Hypothyroid: On synthroid. Per PCP Dr Osborne Casco.  5. Atrial tachycardia:  This was found on ICD interrogation by Dr [name].  Detection threshold for tachyarrhythmias decreased to 130 bpm to make detection of AT episodes easier. He saw Dr. [name] last month and was doing well.  6. Blood type O+   [name] MD 11/08/2015

## 2015-12-02 DIAGNOSIS — Z941 Heart transplant status: Secondary | ICD-10-CM

## 2015-12-02 HISTORY — DX: Heart transplant status: Z94.1

## 2015-12-12 ENCOUNTER — Ambulatory Visit (HOSPITAL_COMMUNITY): Payer: Managed Care, Other (non HMO) | Attending: Internal Medicine

## 2015-12-12 DIAGNOSIS — I5022 Chronic systolic (congestive) heart failure: Secondary | ICD-10-CM

## 2015-12-12 DIAGNOSIS — I509 Heart failure, unspecified: Secondary | ICD-10-CM | POA: Insufficient documentation

## 2015-12-18 ENCOUNTER — Other Ambulatory Visit: Payer: Self-pay | Admitting: Sports Medicine

## 2015-12-18 DIAGNOSIS — M545 Low back pain: Secondary | ICD-10-CM

## 2015-12-19 ENCOUNTER — Other Ambulatory Visit: Payer: Managed Care, Other (non HMO)

## 2015-12-21 ENCOUNTER — Inpatient Hospital Stay: Admission: RE | Admit: 2015-12-21 | Payer: Managed Care, Other (non HMO) | Source: Ambulatory Visit

## 2015-12-24 ENCOUNTER — Ambulatory Visit
Admission: RE | Admit: 2015-12-24 | Discharge: 2015-12-24 | Disposition: A | Discharge status: Home / Self Care | Payer: Managed Care, Other (non HMO) | Source: Ambulatory Visit | Attending: Sports Medicine | Admitting: Sports Medicine

## 2015-12-24 ENCOUNTER — Other Ambulatory Visit: Payer: Self-pay | Admitting: Sports Medicine

## 2015-12-24 DIAGNOSIS — M545 Low back pain: Secondary | ICD-10-CM

## 2015-12-25 ENCOUNTER — Other Ambulatory Visit (HOSPITAL_COMMUNITY): Payer: Self-pay | Admitting: Neurological Surgery

## 2015-12-25 ENCOUNTER — Other Ambulatory Visit: Payer: Managed Care, Other (non HMO)

## 2015-12-26 ENCOUNTER — Other Ambulatory Visit: Payer: Managed Care, Other (non HMO)

## 2015-12-26 ENCOUNTER — Telehealth (HOSPITAL_COMMUNITY): Payer: Self-pay | Admitting: *Deleted

## 2015-12-26 NOTE — Telephone Encounter (Signed)
Received fax from [facility] pt needs an L/4-5 microdiskectomy under genal anesthesia on 01/04/16 with Dr Ronnald Ramp, ok per Dr Haroldine Laws, he signed clearance form.  Form faxed back to them at [phone]

## 2015-12-31 NOTE — Pre-Procedure Instructions (Signed)
Glenn Gonzalez  12/31/2015      [facility] [place], [address] [place] [address] Phone: [phone] Fax: [phone]  [id number] - [place], [place]. AT [address] [place] [address] Phone: [phone] Fax: [phone]    Your procedure is scheduled on Friday, February  3rd.  Report to [facility] Admitting at 10:45A.M.  Call this number if you have problems the morning of surgery:  [phone]   Remember:  Do not eat food or drink liquids after midnight.   Take these medicines the morning of surgery with A SIP OF WATER carvedilol  (coreg), Hydrocodone-acetaminophen (norco) or tramadol (Ultram) if needed, levothyroxine (Synthroid), Rabeprazole (Acipex)  Stop taking Aspirin, Ibuprofen Advil, motrin, Aleve, BC's, Goody's, Herbal medications, Fish Oil   Do not wear jewelry.  Do not wear lotions, powders, or colognes.  You may NOT wear deodorant.  Men may shave face and neck.  Do not bring valuables to the hospital.  [facility] is not responsible for any belongings or valuables.  Contacts, dentures or bridgework may not be worn into surgery.  Leave your suitcase in the car.  After surgery it may be brought to your room.  For patients admitted to the hospital, discharge time will be determined by your treatment team.  Patients discharged the day of surgery will not be allowed to drive home.    Special instructions:  [place] - Preparing for Surgery  Before surgery, you can play an important role.  Because skin is not sterile, your skin needs to be as free of germs as possible.  You can reduce the number of germs on you skin by washing with CHG (chlorahexidine gluconate) soap before surgery.  CHG is an antiseptic cleaner which kills germs and bonds with the skin to continue killing germs even after washing.  Please DO  NOT use if you have an allergy to CHG or antibacterial soaps.  If your skin becomes reddened/irritated stop using the CHG and inform your nurse when you arrive at Short Stay.  Do not shave (including legs and underarms) for at least 48 hours prior to the first CHG shower.  You may shave your face.  Please follow these instructions carefully:   1.  Shower with CHG Soap the night before surgery and the    morning of Surgery.  2.  If you choose to wash your hair, wash your hair first as usual with your   normal shampoo.  3.  After you shampoo, rinse your hair and body thoroughly to remove the   Shampoo.  4.  Use CHG as you would any other liquid soap.  You can apply chg directly to the skin and wash gently with scrungie or a clean washcloth.  5.  Apply the CHG Soap to your body ONLY FROM THE NECK DOWN.  Do not use on open wounds or open sores.  Avoid contact with your eyes,  ears, mouth and genitals (private parts).  Wash genitals (private parts)  with your normal soap.  6.  Wash thoroughly, paying special attention to the area where your surgery   will be performed.  7.  Thoroughly rinse your body with warm water from the neck down.  8.  DO NOT shower/wash with your normal soap after using and rinsing off    the CHG Soap.  9.  [name]  yourself dry with a clean towel.            10.  Wear clean pajamas.            11.  Place clean sheets on your bed the night of your first shower and do not   sleep with pets.  Day of Surgery  Do not apply any lotions/deoderants the morning of surgery.  Please wear clean clothes to the hospital/surgery center.     Please read over the following fact sheets that you were given. Pain Booklet, Coughing and Deep Breathing, MRSA Information and Surgical Site Infection Prevention

## 2016-01-01 ENCOUNTER — Encounter (HOSPITAL_COMMUNITY)
Admission: RE | Admit: 2016-01-01 | Discharge: 2016-01-01 | Disposition: A | Discharge status: Home / Self Care | Payer: Managed Care, Other (non HMO) | Source: Ambulatory Visit | Attending: Neurological Surgery | Admitting: Neurological Surgery

## 2016-01-01 ENCOUNTER — Encounter (HOSPITAL_COMMUNITY): Payer: Self-pay

## 2016-01-01 DIAGNOSIS — I509 Heart failure, unspecified: Secondary | ICD-10-CM | POA: Diagnosis not present

## 2016-01-01 DIAGNOSIS — Z950 Presence of cardiac pacemaker: Secondary | ICD-10-CM | POA: Diagnosis not present

## 2016-01-01 DIAGNOSIS — M5126 Other intervertebral disc displacement, lumbar region: Secondary | ICD-10-CM | POA: Diagnosis present

## 2016-01-01 DIAGNOSIS — Z8571 Personal history of Hodgkin lymphoma: Secondary | ICD-10-CM | POA: Diagnosis not present

## 2016-01-01 DIAGNOSIS — Z880 Allergy status to penicillin: Secondary | ICD-10-CM | POA: Diagnosis not present

## 2016-01-01 DIAGNOSIS — K219 Gastro-esophageal reflux disease without esophagitis: Secondary | ICD-10-CM | POA: Diagnosis not present

## 2016-01-01 DIAGNOSIS — J45909 Unspecified asthma, uncomplicated: Secondary | ICD-10-CM | POA: Diagnosis not present

## 2016-01-01 DIAGNOSIS — E039 Hypothyroidism, unspecified: Secondary | ICD-10-CM | POA: Diagnosis not present

## 2016-01-01 HISTORY — DX: Personal history of urinary calculi: Z87.442

## 2016-01-01 HISTORY — DX: Personal history of irradiation: Z92.3

## 2016-01-01 HISTORY — DX: Reserved for inherently not codable concepts without codable children: IMO0001

## 2016-01-01 LAB — SURGICAL PCR SCREEN
MRSA, PCR: NEGATIVE
Staphylococcus aureus: NEGATIVE

## 2016-01-01 LAB — BASIC METABOLIC PANEL
Anion gap: 10 (ref 5–15)
BUN: 23 mg/dL — ABNORMAL HIGH (ref 6–20)
CALCIUM: 9.3 mg/dL (ref 8.9–10.3)
CO2: 23 mmol/L (ref 22–32)
CREATININE: 1.17 mg/dL (ref 0.61–1.24)
Chloride: 106 mmol/L (ref 101–111)
GFR calc non Af Amer: >60 mL/min (ref >60)
Glucose, Bld: 103 mg/dL — ABNORMAL HIGH (ref 65–99)
Potassium: 4.5 mmol/L (ref 3.5–5.1)
SODIUM: 139 mmol/L (ref 135–145)

## 2016-01-01 LAB — CBC WITH DIFFERENTIAL/PLATELET
BASOS PCT: 0 %
Basophils Absolute: 0 10*3/uL (ref 0.0–0.1)
EOS ABS: 0.1 10*3/uL (ref 0.0–0.7)
Eosinophils Relative: 2 %
HCT: 48.1 % (ref 39.0–52.0)
HEMOGLOBIN: 15.6 g/dL (ref 13.0–17.0)
Lymphocytes Relative: 15 %
Lymphs Abs: 1.2 10*3/uL (ref 0.7–4.0)
MCH: 31.5 pg (ref 26.0–34.0)
MCHC: 32.4 g/dL (ref 30.0–36.0)
MCV: 97 fL (ref 78.0–100.0)
MONOS PCT: 11 %
Monocytes Absolute: 0.9 10*3/uL (ref 0.1–1.0)
NEUTROS PCT: 72 %
Neutro Abs: 5.4 10*3/uL (ref 1.7–7.7)
Platelets: 230 10*3/uL (ref 150–400)
RBC: 4.96 MIL/uL (ref 4.22–5.81)
RDW: 14.5 % (ref 11.5–15.5)
WBC: 7.5 10*3/uL (ref 4.0–10.5)

## 2016-01-01 LAB — PROTIME-INR
INR: 1.07 (ref 0.00–1.49)
Prothrombin Time: 14.1 seconds (ref 11.6–15.2)

## 2016-01-01 NOTE — Progress Notes (Signed)
Patient's blood pressure 75/47 at PAT appointment.  Patient states that he is normally 80/50, that he has always been hypotensive and that his heart medications lowers his BP.  Patient is asymptomatic.

## 2016-01-01 NOTE — Progress Notes (Signed)
PCP - Dr. Osborne Casco Cardiologist - Dr. Haroldine Laws  EKG - 10/17/15 CXR - 07/31/15  Echo - 04/2015 Cath - 11/16  Patient denies chest pain and shortness of breath at PAT appointment.  Patient has cardiac clearance in chart from Dr. Haroldine Laws.

## 2016-01-02 NOTE — Progress Notes (Signed)
Anesthesia Chart Review:  Pt is a 55 year old male scheduled for L4-5 lumbar laminectomy/ decompression microdiscectomy on 01/04/2016 with Dr. Ronnald Ramp.   Cardiologist is Dr. [name], who has cleared pt for this surgery. PCP is  Dr. Osborne Casco. EP cardiologist is Dr. Virl Axe  PMH includes:  CHF (due to NICM, thought to be secondary to chemo, on heart transplant list at [facility]), atrial fibrillation, pacemaker [facility] [employer] CRT-D (implanted 2011)), asthma, hypothyroidism, Hodgkin's lymphoma, GERD. Never smoker. BMI 26. S/p cystoscopy 10/02/15.   BP at PAT was 75/47.   Medications include: carvedilol, lasix, levothyroxine, aciphex, sacubitril-valsartan.   Preoperative labs reviewed.    Chest x-ray 07/31/15 reviewed. No active cardiopulmonary disease  EKG 10/17/15: electronic ventricular pacemaker  Cardiopulmonary exercise test 12/12/15:  - Exercise testing with gas exchange demonstrates a moderate functional impairment when compared to matched sedentary norms. Pre-exercise spirometry suggests restrictive lung physiology. Patient's primary limitation is due to significant HF and marked chronotropic incompetence. Patient presented with significant asymptomatic hypotension with poor BP response to exercise.  Cardiac cath right heart 10/11/15 (care everywhere):  - Cardiac Index: 2.4 L/min/m2  - Right Atrium Mean Pressure: 5 mmHg  - Right Ventricle Systolic Pressure: 34 mmHg  - Pulmonary Artery Mean Pressure: 24 mmHg  - Pulmonary Wedge Pressure: 17 mmHg   -Pulmonary Vascular Resistance:  1.4 Wood units   Carotid duplex 09/26/15 (care everywhere): No hemodynamically significant extracranial carotid stenoses bilaterally  PFTs 09/26/15 (care everywhere):  - FEV1: 2.92 - FVC: 3.89 - FEV1/FVC: 75.06  Cardiac cath left/right 04/20/15:  1. Normal coronary arteries 2. Severe systolic dysfunction due to NICM EF 10% 3. Mildly elevated filling pressures 4. Mild to moderately depressed  cardiac output by Fick. Severe reduction by thermodilution.   Echo 04/12/15:  - Left ventricle: The cavity size was mildly dilated. Wallthickness was normal. The estimated ejection fraction was 15%. E/medial e&' > 15, suggestive of LVEDP > 20 mmHg. Diffuse hypokinesis. - Aortic valve: There was no stenosis. - Mitral valve: There was mild regurgitation. - Left atrium: The atrium was mildly dilated. - Right ventricle: Poorly visualized. I get the sense that the RV size and systolic function is normal. Pacer wire or catheter noted in right ventricle. - Right atrium: The atrium was mildly dilated. - Pulmonary arteries: PA peak pressure: 31 mm Hg (S). - Inferior vena cava: The vessel was normal in size. The respirophasic diameter changes were in the normal range (>= 50%), consistent with normal central venous pressure. - Pericardium, extracardiac: Small circumferential pericardial effusion primarily noted along the RV free wall. - Impressions: Rhythm is difficult, not tachycardic but P waves not clear. Mildly dilated LV with severe diffuse hypokinesis, EF 15%. Probably normal RV size and systolic function. Mild MR. Small circumferential pericardial effusion. IVC is not dilated.  Reviewed case with Dr. Lissa Hoard.   If no changes, I anticipate pt can proceed with surgery as scheduled.   Willeen Cass, FNP-BC [facility] Short Stay Surgical Center/Anesthesiology Phone: [phone] 01/02/2016 12:37 PM

## 2016-01-04 ENCOUNTER — Ambulatory Visit (HOSPITAL_COMMUNITY)
Admission: RE | Admit: 2016-01-04 | Discharge: 2016-01-05 | Disposition: A | Discharge status: Home / Self Care | Payer: Managed Care, Other (non HMO) | Source: Ambulatory Visit | Attending: Neurological Surgery | Admitting: Neurological Surgery

## 2016-01-04 ENCOUNTER — Ambulatory Visit (HOSPITAL_COMMUNITY): Payer: Managed Care, Other (non HMO) | Admitting: Certified Registered Nurse Anesthetist

## 2016-01-04 ENCOUNTER — Encounter (HOSPITAL_COMMUNITY)
Admission: RE | Disposition: A | Discharge status: Home / Self Care | Payer: Self-pay | Source: Ambulatory Visit | Attending: Neurological Surgery

## 2016-01-04 ENCOUNTER — Ambulatory Visit (HOSPITAL_COMMUNITY): Payer: Managed Care, Other (non HMO) | Admitting: Emergency Medicine

## 2016-01-04 ENCOUNTER — Ambulatory Visit (HOSPITAL_COMMUNITY): Payer: Managed Care, Other (non HMO)

## 2016-01-04 ENCOUNTER — Encounter (HOSPITAL_COMMUNITY): Payer: Self-pay | Admitting: General Practice

## 2016-01-04 DIAGNOSIS — Z419 Encounter for procedure for purposes other than remedying health state, unspecified: Secondary | ICD-10-CM

## 2016-01-04 DIAGNOSIS — M5126 Other intervertebral disc displacement, lumbar region: Secondary | ICD-10-CM | POA: Diagnosis not present

## 2016-01-04 DIAGNOSIS — K219 Gastro-esophageal reflux disease without esophagitis: Secondary | ICD-10-CM | POA: Insufficient documentation

## 2016-01-04 DIAGNOSIS — E039 Hypothyroidism, unspecified: Secondary | ICD-10-CM | POA: Insufficient documentation

## 2016-01-04 DIAGNOSIS — J45909 Unspecified asthma, uncomplicated: Secondary | ICD-10-CM | POA: Insufficient documentation

## 2016-01-04 DIAGNOSIS — I509 Heart failure, unspecified: Secondary | ICD-10-CM | POA: Insufficient documentation

## 2016-01-04 DIAGNOSIS — Z9889 Other specified postprocedural states: Secondary | ICD-10-CM

## 2016-01-04 DIAGNOSIS — Z950 Presence of cardiac pacemaker: Secondary | ICD-10-CM | POA: Insufficient documentation

## 2016-01-04 DIAGNOSIS — Z880 Allergy status to penicillin: Secondary | ICD-10-CM | POA: Insufficient documentation

## 2016-01-04 DIAGNOSIS — Z8571 Personal history of Hodgkin lymphoma: Secondary | ICD-10-CM | POA: Insufficient documentation

## 2016-01-04 HISTORY — PX: LUMBAR LAMINECTOMY/DECOMPRESSION MICRODISCECTOMY: SHX5026

## 2016-01-04 SURGERY — LUMBAR LAMINECTOMY/DECOMPRESSION MICRODISCECTOMY 1 LEVEL
Anesthesia: General | Site: Back | Laterality: Right

## 2016-01-04 MED ORDER — PHENOL 1.4 % MT LIQD: 1.0000 | OROMUCOSAL | Status: DC | PRN

## 2016-01-04 MED ORDER — GELATIN ABSORBABLE MT POWD
OROMUCOSAL | Status: DC | PRN
  Administered 2016-01-04: 15:00:00 via TOPICAL

## 2016-01-04 MED ORDER — FENTANYL CITRATE (PF) 250 MCG/5ML IJ SOLN
INTRAMUSCULAR | Status: AC
  Filled 2016-01-04: qty 5

## 2016-01-04 MED ORDER — VANCOMYCIN HCL IN DEXTROSE 1-5 GM/200ML-% IV SOLN
1000.0000 mg | Freq: Once | INTRAVENOUS | Status: AC
  Administered 2016-01-05: 1000 mg via INTRAVENOUS
  Filled 2016-01-04: qty 200

## 2016-01-04 MED ORDER — SUGAMMADEX SODIUM 500 MG/5ML IV SOLN
INTRAVENOUS | Status: AC
  Filled 2016-01-04: qty 5

## 2016-01-04 MED ORDER — DEXAMETHASONE SODIUM PHOSPHATE 10 MG/ML IJ SOLN
10.0000 mg | INTRAMUSCULAR | Status: AC
  Administered 2016-01-04: 10 mg via INTRAVENOUS

## 2016-01-04 MED ORDER — MORPHINE SULFATE (PF) 2 MG/ML IV SOLN: 1.0000 mg | INTRAVENOUS | Status: DC | PRN

## 2016-01-04 MED ORDER — PROPOFOL 10 MG/ML IV BOLUS
INTRAVENOUS | Status: AC
  Filled 2016-01-04: qty 20

## 2016-01-04 MED ORDER — LIDOCAINE HCL (CARDIAC) 20 MG/ML IV SOLN
INTRAVENOUS | Status: AC
  Filled 2016-01-04: qty 5

## 2016-01-04 MED ORDER — LACTATED RINGERS IV SOLN
INTRAVENOUS | Status: DC
  Administered 2016-01-04: 12:00:00 via INTRAVENOUS

## 2016-01-04 MED ORDER — TRAMADOL HCL 50 MG PO TABS: 50.0000 mg | ORAL_TABLET | Freq: Four times a day (QID) | ORAL | Status: DC | PRN

## 2016-01-04 MED ORDER — PROMETHAZINE HCL 25 MG/ML IJ SOLN: 6.2500 mg | INTRAMUSCULAR | Status: DC | PRN

## 2016-01-04 MED ORDER — HYDROMORPHONE HCL 1 MG/ML IJ SOLN
INTRAMUSCULAR | Status: AC
  Administered 2016-01-04: 0.5 mg via INTRAVENOUS
  Filled 2016-01-04: qty 1

## 2016-01-04 MED ORDER — LEVOTHYROXINE SODIUM 125 MCG PO TABS
125.0000 ug | ORAL_TABLET | Freq: Every day | ORAL | Status: DC
  Filled 2016-01-04: qty 1

## 2016-01-04 MED ORDER — HYDROCODONE-ACETAMINOPHEN 10-325 MG PO TABS: 1.0000 | ORAL_TABLET | Freq: Three times a day (TID) | ORAL | Status: DC | PRN

## 2016-01-04 MED ORDER — ONDANSETRON HCL 4 MG/2ML IJ SOLN
INTRAMUSCULAR | Status: DC | PRN
  Administered 2016-01-04: 4 mg via INTRAVENOUS

## 2016-01-04 MED ORDER — MIDAZOLAM HCL 5 MG/5ML IJ SOLN
INTRAMUSCULAR | Status: DC | PRN
  Administered 2016-01-04: 1 mg via INTRAVENOUS

## 2016-01-04 MED ORDER — CARVEDILOL 6.25 MG PO TABS: 12.5000 mg | ORAL_TABLET | Freq: Two times a day (BID) | ORAL | Status: DC

## 2016-01-04 MED ORDER — VANCOMYCIN HCL IN DEXTROSE 1-5 GM/200ML-% IV SOLN
INTRAVENOUS | Status: AC
  Administered 2016-01-04: 1000 mg via INTRAVENOUS
  Filled 2016-01-04: qty 200

## 2016-01-04 MED ORDER — SODIUM CHLORIDE 0.9 % IR SOLN
Status: DC | PRN
  Administered 2016-01-04: 15:00:00

## 2016-01-04 MED ORDER — BUPIVACAINE HCL (PF) 0.25 % IJ SOLN
INTRAMUSCULAR | Status: DC | PRN
  Administered 2016-01-04: 5 mL

## 2016-01-04 MED ORDER — ONDANSETRON HCL 4 MG/2ML IJ SOLN: 4.0000 mg | INTRAMUSCULAR | Status: DC | PRN

## 2016-01-04 MED ORDER — DEXAMETHASONE SODIUM PHOSPHATE 10 MG/ML IJ SOLN
INTRAMUSCULAR | Status: AC
  Filled 2016-01-04: qty 1

## 2016-01-04 MED ORDER — SODIUM CHLORIDE 0.9% FLUSH: 3.0000 mL | INTRAVENOUS | Status: DC | PRN

## 2016-01-04 MED ORDER — POTASSIUM CHLORIDE IN NACL 20-0.9 MEQ/L-% IV SOLN
INTRAVENOUS | Status: DC
  Filled 2016-01-04 (×3): qty 1000

## 2016-01-04 MED ORDER — MENTHOL 3 MG MT LOZG: 1.0000 | LOZENGE | OROMUCOSAL | Status: DC | PRN

## 2016-01-04 MED ORDER — METHOCARBAMOL 1000 MG/10ML IJ SOLN: 500.0000 mg | Freq: Four times a day (QID) | INTRAMUSCULAR | Status: DC | PRN

## 2016-01-04 MED ORDER — ACETAMINOPHEN 650 MG RE SUPP: 650.0000 mg | RECTAL | Status: DC | PRN

## 2016-01-04 MED ORDER — HYDROMORPHONE HCL 1 MG/ML IJ SOLN
0.2500 mg | INTRAMUSCULAR | Status: DC | PRN
  Administered 2016-01-04: 0.5 mg via INTRAVENOUS

## 2016-01-04 MED ORDER — METHOCARBAMOL 500 MG PO TABS: 500.0000 mg | ORAL_TABLET | Freq: Four times a day (QID) | ORAL | Status: DC | PRN

## 2016-01-04 MED ORDER — ROCURONIUM BROMIDE 50 MG/5ML IV SOLN
INTRAVENOUS | Status: AC
  Filled 2016-01-04: qty 1

## 2016-01-04 MED ORDER — PHENYLEPHRINE HCL 10 MG/ML IJ SOLN
10.0000 mg | INTRAMUSCULAR | Status: DC | PRN
  Administered 2016-01-04: 15 ug/min via INTRAVENOUS

## 2016-01-04 MED ORDER — SACUBITRIL-VALSARTAN 24-26 MG PO TABS
1.0000 | ORAL_TABLET | Freq: Two times a day (BID) | ORAL | Status: DC
  Filled 2016-01-04: qty 1

## 2016-01-04 MED ORDER — ROCURONIUM BROMIDE 100 MG/10ML IV SOLN
INTRAVENOUS | Status: DC | PRN
  Administered 2016-01-04: 50 mg via INTRAVENOUS

## 2016-01-04 MED ORDER — VANCOMYCIN HCL IN DEXTROSE 1-5 GM/200ML-% IV SOLN: 1000.0000 mg | INTRAVENOUS | Status: DC

## 2016-01-04 MED ORDER — ACETAMINOPHEN 500 MG PO TABS
1000.0000 mg | ORAL_TABLET | Freq: Every day | ORAL | Status: DC
  Administered 2016-01-04: 1000 mg via ORAL
  Filled 2016-01-04: qty 2

## 2016-01-04 MED ORDER — LIDOCAINE HCL (CARDIAC) 20 MG/ML IV SOLN
INTRAVENOUS | Status: DC | PRN
  Administered 2016-01-04: 80 mg via INTRAVENOUS

## 2016-01-04 MED ORDER — SUGAMMADEX SODIUM 200 MG/2ML IV SOLN
INTRAVENOUS | Status: DC | PRN
  Administered 2016-01-04: 100 mg via INTRAVENOUS

## 2016-01-04 MED ORDER — THROMBIN 5000 UNITS EX SOLR
CUTANEOUS | Status: DC | PRN
  Administered 2016-01-04 (×2): 5000 [IU] via TOPICAL

## 2016-01-04 MED ORDER — HEMOSTATIC AGENTS (NO CHARGE) OPTIME
TOPICAL | Status: DC | PRN
  Administered 2016-01-04: 1 via TOPICAL

## 2016-01-04 MED ORDER — MIDAZOLAM HCL 2 MG/2ML IJ SOLN
INTRAMUSCULAR | Status: AC
Start: 2016-01-04 — End: 2016-01-04
  Filled 2016-01-04: qty 2

## 2016-01-04 MED ORDER — 0.9 % SODIUM CHLORIDE (POUR BTL) OPTIME
TOPICAL | Status: DC | PRN
  Administered 2016-01-04: 1000 mL

## 2016-01-04 MED ORDER — FENTANYL CITRATE (PF) 100 MCG/2ML IJ SOLN
INTRAMUSCULAR | Status: DC | PRN
  Administered 2016-01-04: 50 ug via INTRAVENOUS
  Administered 2016-01-04: 75 ug via INTRAVENOUS
  Administered 2016-01-04: 25 ug via INTRAVENOUS
  Administered 2016-01-04: 50 ug via INTRAVENOUS

## 2016-01-04 MED ORDER — ETOMIDATE 2 MG/ML IV SOLN
INTRAVENOUS | Status: DC | PRN
  Administered 2016-01-04: 17 mg via INTRAVENOUS

## 2016-01-04 MED ORDER — ONDANSETRON HCL 4 MG/2ML IJ SOLN
INTRAMUSCULAR | Status: AC
  Filled 2016-01-04: qty 2

## 2016-01-04 MED ORDER — ETOMIDATE 2 MG/ML IV SOLN
INTRAVENOUS | Status: AC
  Filled 2016-01-04: qty 10

## 2016-01-04 MED ORDER — ACETAMINOPHEN 325 MG PO TABS
650.0000 mg | ORAL_TABLET | ORAL | Status: DC | PRN
  Administered 2016-01-05 (×2): 650 mg via ORAL
  Filled 2016-01-04 (×2): qty 2

## 2016-01-04 MED ORDER — TRAMADOL HCL 50 MG PO TABS: 50.0000 mg | ORAL_TABLET | ORAL | Status: DC | PRN

## 2016-01-04 MED ORDER — SODIUM CHLORIDE 0.9% FLUSH: 3.0000 mL | Freq: Two times a day (BID) | INTRAVENOUS | Status: DC

## 2016-01-04 SURGICAL SUPPLY — 42 items
APL SKNCLS STERI-STRIP NONHPOA (GAUZE/BANDAGES/DRESSINGS) ×1
BAG DECANTER FOR FLEXI CONT (MISCELLANEOUS) ×3 IMPLANT
BENZOIN TINCTURE PRP APPL 2/3 (GAUZE/BANDAGES/DRESSINGS) ×3 IMPLANT
BUR MATCHSTICK NEURO 3.0 LAGG (BURR) ×3 IMPLANT
CANISTER SUCT 3000ML PPV (MISCELLANEOUS) ×3 IMPLANT
CLOSURE WOUND 1/2 X4 (GAUZE/BANDAGES/DRESSINGS) ×1
DRAPE LAPAROTOMY 100X72X124 (DRAPES) ×3 IMPLANT
DRAPE MICROSCOPE LEICA (MISCELLANEOUS) ×3 IMPLANT
DRAPE POUCH INSTRU U-SHP 10X18 (DRAPES) ×3 IMPLANT
DRAPE SURG 17X23 STRL (DRAPES) ×3 IMPLANT
DRSG OPSITE POSTOP 3X4 (GAUZE/BANDAGES/DRESSINGS) ×2 IMPLANT
DURAPREP 26ML APPLICATOR (WOUND CARE) ×3 IMPLANT
ELECT REM PT RETURN 9FT ADLT (ELECTROSURGICAL) ×3
ELECTRODE REM PT RTRN 9FT ADLT (ELECTROSURGICAL) ×1 IMPLANT
GAUZE SPONGE 4X4 16PLY XRAY LF (GAUZE/BANDAGES/DRESSINGS) IMPLANT
GLOVE BIO SURGEON STRL SZ8 (GLOVE) ×3 IMPLANT
GOWN STRL REUS W/ TWL LRG LVL3 (GOWN DISPOSABLE) IMPLANT
GOWN STRL REUS W/ TWL XL LVL3 (GOWN DISPOSABLE) ×1 IMPLANT
GOWN STRL REUS W/TWL 2XL LVL3 (GOWN DISPOSABLE) IMPLANT
GOWN STRL REUS W/TWL LRG LVL3 (GOWN DISPOSABLE) ×6
GOWN STRL REUS W/TWL XL LVL3 (GOWN DISPOSABLE) ×6
HEMOSTAT POWDER KIT SURGIFOAM (HEMOSTASIS) ×2 IMPLANT
KIT BASIN OR (CUSTOM PROCEDURE TRAY) ×3 IMPLANT
KIT ROOM TURNOVER OR (KITS) ×3 IMPLANT
LIQUID BAND (GAUZE/BANDAGES/DRESSINGS) ×2 IMPLANT
NDL HYPO 25X1 1.5 SAFETY (NEEDLE) ×1 IMPLANT
NDL SPNL 20GX3.5 QUINCKE YW (NEEDLE) IMPLANT
NEEDLE HYPO 25X1 1.5 SAFETY (NEEDLE) ×3 IMPLANT
NEEDLE SPNL 20GX3.5 QUINCKE YW (NEEDLE) IMPLANT
NS IRRIG 1000ML POUR BTL (IV SOLUTION) ×3 IMPLANT
PACK LAMINECTOMY NEURO (CUSTOM PROCEDURE TRAY) ×3 IMPLANT
PAD ARMBOARD 7.5X6 YLW CONV (MISCELLANEOUS) ×9 IMPLANT
RUBBERBAND STERILE (MISCELLANEOUS) ×6 IMPLANT
SPONGE SURGIFOAM ABS GEL SZ50 (HEMOSTASIS) ×3 IMPLANT
STRIP CLOSURE SKIN 1/2X4 (GAUZE/BANDAGES/DRESSINGS) ×2 IMPLANT
SUT VIC AB 0 CT1 18XCR BRD8 (SUTURE) ×1 IMPLANT
SUT VIC AB 0 CT1 8-18 (SUTURE) ×3
SUT VIC AB 2-0 CP2 18 (SUTURE) ×3 IMPLANT
SUT VIC AB 3-0 SH 8-18 (SUTURE) ×3 IMPLANT
TOWEL OR 17X24 6PK STRL BLUE (TOWEL DISPOSABLE) ×3 IMPLANT
TOWEL OR 17X26 10 PK STRL BLUE (TOWEL DISPOSABLE) ×3 IMPLANT
WATER STERILE IRR 1000ML POUR (IV SOLUTION) ×3 IMPLANT

## 2016-01-04 NOTE — Progress Notes (Signed)
Corene Cornea, [employer] rep called back. On his way to Neuro OR now, is aware of pt's arrival time and surgery. Reported that he would speak with Dr. Orene Desanctis.

## 2016-01-04 NOTE — Progress Notes (Addendum)
ANTIBIOTIC CONSULT NOTE - INITIAL  Pharmacy Consult for vancomycin Indication: surgical prophylaxis  Allergies  Allergen Reactions  . Digoxin Other (See Comments)    gynecomastia   . Phencyclidine Other (See Comments)    PCP derived antibiotic > Insomnia  . Spironolactone Other (See Comments)    Gynecomastia  . Lactose Intolerance (Gi) Diarrhea    cramps  . Milk-Related Compounds Other (See Comments)    Cramps and diarrhea Lactose intolerance   . Penicillins     Cramps Has patient had a PCN reaction causing immediate rash, facial/tongue/throat swelling, SOB or lightheadedness with hypotension: No Has patient had a PCN reaction causing severe rash involving mucus membranes or skin necrosis: No Has patient had a PCN reaction that required hospitalization No Has patient had a PCN reaction occurring within the last 10 years: No If all of the above answers are "NO", then may proceed with Cephalosporin use.      Patient Measurements: Height: 6' 1.75" (187.3 cm) Weight: 203 lb 3.2 oz (92.171 kg) IBW/kg (Calculated) : 81.63 Adjusted Body Weight:   Vital Signs: Temp: 98 F (36.7 C) (02/03 1714) Temp Source: Oral (02/03 1214) BP: 110/64 mmHg (02/03 1714) Pulse Rate: 81 (02/03 1714) Intake/Output from previous day:   Intake/Output from this shift: Total I/O In: 500 [I.V.:500] Out: -   Labs: No results for input(s): WBC, HGB, PLT, LABCREA, CREATININE in the last 72 hours. Estimated Creatinine Clearance: 83.3 mL/min (by C-G formula based on Cr of 1.17). No results for input(s): VANCOTROUGH, VANCOPEAK, VANCORANDOM, GENTTROUGH, GENTPEAK, GENTRANDOM, TOBRATROUGH, TOBRAPEAK, TOBRARND, AMIKACINPEAK, AMIKACINTROU, AMIKACIN in the last 72 hours.   Microbiology: Recent Results (from the past 720 hour(s))  Surgical pcr screen     Status: None   Collection Time: 01/01/16 10:49 AM  Result Value Ref Range Status   MRSA, PCR NEGATIVE NEGATIVE Final   Staphylococcus aureus NEGATIVE  NEGATIVE Final    Comment:        The Xpert SA Assay (FDA approved for NASAL specimens in patients over 65 years of age), is one component of a comprehensive surveillance program.  Test performance has been validated by [facility] for patients greater than or equal to 46 year old. It is not intended to diagnose infection nor to guide or monitor treatment.     Medical History: Past Medical History  Diagnosis Date  . CHF (congestive heart failure) ([place])   . A-fib ([place])   . GERD (gastroesophageal reflux disease)   . Pacemaker 2011    [place] device/2011  . Hypothyroidism   . Depression   . Pneumonia     recurrent pneumonia sedf.  rad tx for lymphoma  . Asthma     pt. denies  . Hodgkin's lymphoma ([place]) 1980    IIIB. 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  Marland Kitchen History of radiation therapy   . History of kidney stones   . Shortness of breath dyspnea     with exertion    Medications:  Scheduled:  . carvedilol  12.5 mg Oral BID WC  . dexamethasone      . [START ON 01/05/2016] levothyroxine  125 mcg Oral QAC breakfast  . sacubitril-valsartan  1 tablet Oral BID  . sodium chloride flush  3 mL Intravenous Q12H   Infusions:  . 0.9 % NaCl with KCl 20 mEq / L     Assessment: 55 yo male s/p neurosurgery will be put on vancomycin x1 for surgical prophylaxis.  Patient got one dose of vancomycin 1g at 1350 today.  Patient has no drain.    Goal of Therapy:  Vancomycin trough level 10-15 mcg/ml  Plan:  - vancomycin 1g iv x1 at 0150 tomorrow.  Pharmacy will sign off  Gl[facility], [facility] 01/04/2016,5:28 PM

## 2016-01-04 NOTE — Anesthesia Preprocedure Evaluation (Addendum)
Anesthesia Evaluation  Patient identified by MRN, date of birth, ID band Patient awake    Reviewed: Allergy & Precautions, NPO status , Patient's Chart, lab work & pertinent test results  Airway Mallampati: I  TM Distance: >3 FB Neck ROM: Full    Dental  (+) Teeth Intact   Pulmonary shortness of breath, asthma ,    breath sounds clear to auscultation       Cardiovascular +CHF  + Cardiac Defibrillator  Rhythm:Regular Rate:Normal  Cardiac hx well outlined c known Low EF 20% or so. Able to exercise and walk . On transplant list at [facility]. Spoke c Dr Sung Amabile earlier today.   Neuro/Psych    GI/Hepatic GERD  ,  Endo/Other  Hypothyroidism   Renal/GU      Musculoskeletal   Abdominal   Peds  Hematology   Anesthesia Other Findings   Reproductive/Obstetrics                            Anesthesia Physical Anesthesia Plan  ASA: IV  Anesthesia Plan: General   Post-op Pain Management:    Induction: Intravenous  Airway Management Planned: Oral ETT  Additional Equipment: Arterial line  Intra-op Plan:   Post-operative Plan: Extubation in OR  Informed Consent: I have reviewed the patients History and Physical, chart, labs and discussed the procedure including the risks, benefits and alternatives for the proposed anesthesia with the patient or authorized representative who has indicated his/her understanding and acceptance.   Dental advisory given  Plan Discussed with: CRNA and Surgeon  Anesthesia Plan Comments:         Anesthesia Quick Evaluation

## 2016-01-04 NOTE — H&P (Signed)
Subjective: Patient is a 55 y.o. male admitted for right leg pain. Onset of symptoms was several weeks ago, gradually worsening since that time.  The pain is rated severe, and is located at the across the lower back and radiates to right leg with weakness in the leg. The pain is described as aching and occurs all day. The symptoms have been progressive. Symptoms are exacerbated by exercise. MRI or CT showed herniated disc L4-5 right   Past Medical History  Diagnosis Date  . CHF (congestive heart failure) ([place])   . A-fib ([place])   . GERD (gastroesophageal reflux disease)   . Pacemaker 2011    [employer] device/2011  . Hypothyroidism   . Depression   . Pneumonia     recurrent pneumonia sedf.  rad tx for lymphoma  . Asthma     pt. denies  . Hodgkin's lymphoma ([place]) 1980    IIIB. 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  Marland Kitchen History of radiation therapy   . History of kidney stones   . Shortness of breath dyspnea     with exertion    Past Surgical History  Procedure Laterality Date  . Neck biopses      x5  . Exploratory laparotomy    . Permanent pacemaker  2011    [employer] COGNIS device  . Gynemastia Bilateral   . Portacath placement      06-29-15 inserted, now removed.  . Tonsillectomy    . Colonoscopy with propofol N/A 08/03/2015    Procedure: COLONOSCOPY WITH PROPOFOL;  Surgeon: Carol Ada, MD;  Location: [facility];  Service: Endoscopy;  Laterality: N/A;  wants to try to do procedure without sedation  . Cystoscopy with retrograde pyelogram, ureteroscopy and stent placement Right 10/12/2015    Procedure: [place], URETEROSCOPY AND STENT PLACEMENT;  Surgeon: Cleon Gustin, MD;  Location: [facility];  Service: Urology;  Laterality: Right;  . Stone extraction with basket Right 10/12/2015    Procedure: STONE EXTRACTION WITH BASKET;  Surgeon: Cleon Gustin, MD;  Location: [facility];  Service: Urology;  Laterality:  Right;  . Cardiac catheterization N/A 04/20/2015    Procedure: Right/Left Heart Cath and Coronary Angiography;  Surgeon: Jolaine Artist, MD;  Location: [facility];  Service: Cardiovascular;  Laterality: N/A;  . Cardiac catheterization  10-11-15    pt states routine heart cath to be done at [place]   . Esophagogastroduodenoscopy      Prior to Admission medications   Medication Sig Start Date End Date Taking? Authorizing Provider  acetaminophen (TYLENOL) 650 MG CR tablet Take 1,300 mg by mouth at bedtime.    Yes Historical Provider, MD  carvedilol (COREG) 12.5 MG tablet Take 1 tablet (12.5 mg total) by mouth 2 (two) times daily with a meal. 05/31/15  Yes Jolaine Artist, MD  cetirizine (ZYRTEC) 10 MG tablet Take 10 mg by mouth at bedtime.    Yes Historical Provider, MD  furosemide (LASIX) 40 MG tablet Take 40 mg by mouth daily as needed for fluid.    Yes Historical Provider, MD  HYDROcodone-acetaminophen (NORCO) 10-325 MG tablet Take 1 tablet by mouth every 8 (eight) hours as needed for moderate pain.  12/20/15  Yes Historical Provider, MD  levothyroxine (SYNTHROID, LEVOTHROID) 125 MCG tablet Take 125 mcg by mouth daily. 01/23/15  Yes Historical Provider, MD  RABEprazole (ACIPHEX) 20 MG tablet Take 1 tablet (20 mg total) by mouth 2 (two) times  daily. 03/07/11  Yes Jolaine Artist, MD  sacubitril-valsartan (ENTRESTO) 24-26 MG Take 0.75 tablets by mouth 2 (two) times daily.   Yes Historical Provider, MD  traMADol (ULTRAM) 50 MG tablet Take 50 mg by mouth every 6 (six) hours as needed for moderate pain.  12/20/15  Yes Historical Provider, MD   Allergies  Allergen Reactions  . Digoxin Other (See Comments)    gynecomastia   . Phencyclidine Other (See Comments)    PCP derived antibiotic > Insomnia  . Spironolactone Other (See Comments)    Gynecomastia  . Lactose Intolerance (Gi) Diarrhea    cramps  . Milk-Related Compounds Other (See Comments)    Cramps and diarrhea Lactose  intolerance   . Penicillins     Cramps Has patient had a PCN reaction causing immediate rash, facial/tongue/throat swelling, SOB or lightheadedness with hypotension: No Has patient had a PCN reaction causing severe rash involving mucus membranes or skin necrosis: No Has patient had a PCN reaction that required hospitalization No Has patient had a PCN reaction occurring within the last 10 years: No If all of the above answers are "NO", then may proceed with Cephalosporin use.      Social History  Substance Use Topics  . Smoking status: Never Smoker   . Smokeless tobacco: Never Used  . Alcohol Use: No    Family History  Problem Relation Age of Onset  . Hyperthyroidism Mother   . Insomnia Mother   . Hypertension Father   . Depression Father   . Insomnia Father      Review of Systems  Positive ROS: neg  All other systems have been reviewed and were otherwise negative with the exception of those mentioned in the HPI and as above.  Objective: Vital signs in last 24 hours: Temp:  [98.3 F (36.8 C)] 98.3 F (36.8 C) (02/03 1214) Pulse Rate:  [77] 77 (02/03 1214) Resp:  [20] 20 (02/03 1214) BP: (93)/(49) 93/49 mmHg (02/03 1214) SpO2:  [98 %] 98 % (02/03 1214) Weight:  [92.171 kg (203 lb 3.2 oz)] 92.171 kg (203 lb 3.2 oz) (02/03 1214)  General Appearance: Alert, cooperative, no distress, appears stated age Head: Normocephalic, without obvious abnormality, atraumatic Eyes: PERRL, conjunctiva/corneas clear, EOM's intact    Neck: Supple, symmetrical, trachea midline Back: Symmetric, no curvature, ROM normal, no CVA tenderness Lungs:  respirations unlabored Heart: Regular rate and rhythm Abdomen: Soft, non-tender Extremities: Extremities normal, atraumatic, no cyanosis or edema Pulses: 2+ and symmetric all extremities Skin: Skin color, texture, turgor normal, no rashes or lesions  NEUROLOGIC:   Mental status: Alert and oriented x4,  no aphasia, good attention span, fund of  knowledge, and memory Motor Exam - grossly normal Sensory Exam - grossly normal Reflexes: 1+ Coordination - grossly normal Gait - grossly normal Balance - grossly normal Cranial Nerves: I: smell Not tested  II: visual acuity  OS: nl    OD: nl  II: visual fields Full to confrontation  II: pupils Equal, round, reactive to light  III,VII: ptosis None  III,IV,VI: extraocular muscles  Full ROM  V: mastication Normal  V: facial light touch sensation  Normal  V,VII: corneal reflex  Present  VII: facial muscle function - upper  Normal  VII: facial muscle function - lower Normal  VIII: hearing Not tested  IX: soft palate elevation  Normal  IX,X: gag reflex Present  XI: trapezius strength  5/5  XI: sternocleidomastoid strength 5/5  XI: neck flexion strength  5/5  XII:  tongue strength  Normal    Data Review Lab Results  Component Value Date   WBC 7.5 01/01/2016   HGB 15.6 01/01/2016   HCT 48.1 01/01/2016   MCV 97.0 01/01/2016   PLT 230 01/01/2016   Lab Results  Component Value Date   NA 139 01/01/2016   K 4.5 01/01/2016   CL 106 01/01/2016   CO2 23 01/01/2016   BUN 23* 01/01/2016   CREATININE 1.17 01/01/2016   GLUCOSE 103* 01/01/2016   Lab Results  Component Value Date   INR 1.07 01/01/2016    Assessment/Plan: Patient admitted for right L4-5 microdiscectomy. Patient has failed a reasonable attempt at conservative therapy.  I explained the condition and procedure to the patient and answered any questions.  Patient wishes to proceed with procedure as planned. Understands risks/ benefits and typical outcomes of procedure.   Vici Novick S 01/04/2016 1:01 PM

## 2016-01-04 NOTE — Progress Notes (Signed)
Per Willeen Cass, NP, this nurse paged [employer] rep to be present for procedure as Dr. Orene Desanctis ordered ICD be turned off during procedure and interrogated after procedure. Spoke with Joseph Art at [employer], awaiting rep to call back.

## 2016-01-04 NOTE — Op Note (Signed)
01/04/2016  3:27 PM  PATIENT:  Glenn Gonzalez  55 y.o. male  PRE-OPERATIVE DIAGNOSIS:  Right L4-5 herniated disc  POST-OPERATIVE DIAGNOSIS:  Same  PROCEDURE:  Right L4-5 hemilaminectomy and medial facetectomy foraminotomy followed by microdiscectomy utilizing microscopic dissection  SURGEON:  Sherley Bounds, MD  ASSISTANTS: Dr. Kathyrn Sheriff  ANESTHESIA:   General  EBL: Less than 25 ml  Total I/O In: 500 [I.V.:500] Out: -   BLOOD ADMINISTERED:none  DRAINS: None   SPECIMEN:  No Specimen  INDICATION FOR PROCEDURE: This patient presented with severe right leg pain with weakness in his leg. He had a CT scan which showed a herniated disc at L4-5 on the right with superior free fragment. He tried medical management without relief. Recommended a microdiscectomy. Patient understood the risks, benefits, and alternatives and potential outcomes and wished to proceed.  PROCEDURE DETAILS: The patient was taken to the operating room and after induction of adequate generalized endotracheal anesthesia, the patient was rolled into the prone position on the Wilson frame and all pressure points were padded. The lumbar region was cleaned and then prepped with DuraPrep and draped in the usual sterile fashion. 5 cc of local anesthesia was injected and then a dorsal midline incision was made and carried down to the lumbo sacral fascia. The fascia was opened and the paraspinous musculature was taken down in a subperiosteal fashion to expose L4-5 on the right. Intraoperative x-ray confirmed my level, and then I used a combination of the high-speed drill and the Kerrison punches to perform a hemilaminectomy, medial facetectomy, and foraminotomy at L4-5 on the right. The underlying yellow ligament was opened and removed in a piecemeal fashion to expose the underlying dura and exiting nerve root. I undercut the lateral recess and dissected down until I was medial to and distal to the pedicle. The nerve root was well  decompressed. We then gently retracted the nerve root medially with a retractor, coagulated the epidural venous vasculature, and found a superior fragment that was removed with a nerve hook and pituitary rongeurs were. This was quite a large fragment. I then palpated with a coronary dilator nerve hook to assure no more free fragment.  I then palpated with a coronary dilator along the nerve root and into the foramen to assure adequate decompression. I felt no more compression of the nerve root. I irrigated with saline solution containing bacitracin. Achieved hemostasis with bipolar cautery, lined the dura with Gelfoam, and then closed the fascia with 0 Vicryl. I closed the subcutaneous tissues with 2-0 Vicryl and the subcuticular tissues with 3-0 Vicryl. The skin was then closed with benzoin and Steri-Strips. The drapes were removed, a sterile dressing was applied. The patient was awakened from general anesthesia and transferred to the recovery room in stable condition. At the end of the procedure all sponge, needle and instrument counts were correct.   PLAN OF CARE: Admit for overnight observation  PATIENT DISPOSITION:  PACU - hemodynamically stable.   Delay start of Pharmacological VTE agent (>24hrs) due to surgical blood loss or risk of bleeding:  yes

## 2016-01-04 NOTE — Transfer of Care (Signed)
Immediate Anesthesia Transfer of Care Note  Patient: Glenn Gonzalez  Procedure(s) Performed: Procedure(s): Microdiscectomy Lumbar four Lumbar five right (Right)  Patient Location: PACU  Anesthesia Type:General  Level of Consciousness: awake, alert , oriented and patient cooperative  Airway & Oxygen Therapy: Patient Spontanous Breathing and Patient connected to nasal cannula oxygen  Post-op Assessment: Report given to RN and Post -op Vital signs reviewed and stable  Post vital signs: Reviewed and stable  Last Vitals:  Filed Vitals:   01/04/16 1214  BP: 93/49  Pulse: 77  Temp: 36.8 C  Resp: 20    Complications: No apparent anesthesia complications

## 2016-01-04 NOTE — Anesthesia Postprocedure Evaluation (Signed)
Anesthesia Post Note  Patient: Glenn Gonzalez  Procedure(s) Performed: Procedure(s) (LRB): Microdiscectomy Lumbar four Lumbar five right (Right)  Patient location during evaluation: PACU Anesthesia Type: General Level of consciousness: awake and alert Pain management: pain level controlled Vital Signs Assessment: post-procedure vital signs reviewed and stable Respiratory status: spontaneous breathing, nonlabored ventilation, respiratory function stable and patient connected to nasal cannula oxygen Cardiovascular status: blood pressure returned to baseline and stable Postop Assessment: no signs of nausea or vomiting Anesthetic complications: no    Last Vitals:  Filed Vitals:   01/04/16 1615 01/04/16 1630  BP: 98/51 101/55  Pulse: 74 74  Temp:    Resp: 19 18    Last Pain:  Filed Vitals:   01/04/16 1641  PainSc: 2                  Veena Sturgess,JAMES TERRILL

## 2016-01-04 NOTE — Anesthesia Procedure Notes (Signed)
Procedure Name: MAC Date/Time: 01/04/2016 1:46 PM Performed by: Salli Quarry Miquel Lamson Pre-anesthesia Checklist: Patient identified, Emergency Drugs available, Suction available and Patient being monitored Patient Re-evaluated:Patient Re-evaluated prior to inductionOxygen Delivery Method: Circle system utilized Preoxygenation: Pre-oxygenation with 100% oxygen Intubation Type: IV induction Ventilation: Mask ventilation without difficulty Laryngoscope Size: Mac and 4 Grade View: Grade I Tube type: Oral Tube size: 8.0 mm Number of attempts: 1 Airway Equipment and Method: Stylet Placement Confirmation: ETT inserted through vocal cords under direct vision,  positive ETCO2 and breath sounds checked- equal and bilateral Secured at: 23 cm Tube secured with: Tape Dental Injury: Teeth and Oropharynx as per pre-operative assessment

## 2016-01-05 DIAGNOSIS — M5126 Other intervertebral disc displacement, lumbar region: Secondary | ICD-10-CM | POA: Diagnosis not present

## 2016-01-05 NOTE — Discharge Instructions (Signed)
Wound Care Keep incision covered and dry for 3 days. You may remove outer bandage after 3 days  Do not put any creams, lotions, or ointments on incision. Leave steri-strips on back.  They will fall off by themselves. Activity Walk each and every day, increasing distance each day. No lifting greater than 5 lbs.  Avoid bending, arching, or twisting. No driving for 2 weeks; may ride as a passenger locally.  Diet Resume your normal diet.  Return to Work Will be discussed at you follow up appointment. Call Your Doctor If Any of These Occur Redness, drainage, or swelling at the wound.  Temperature greater than 101 degrees. Severe pain not relieved by pain medication. Incision starts to come apart. Follow Up Appt Call today for appointment in 1-2 weeks [phone]) or for problems.  If you have any hardware placed in your spine, you will need an x-ray before your appointment.

## 2016-01-05 NOTE — Discharge Summary (Signed)
Physician Discharge Summary  Patient ID: TAZ WAGGLE MRN: [id number] DOB/AGE: 04-01-1961 55 y.o.  Admit date: 01/04/2016 Discharge date: 01/05/2016  Admission Diagnoses:Herniated lumbar disc L 45 right with CHF, pacemeker  Discharge Diagnoses: Herniated lumbar disc L 45 right with CHF, pacemeker Active Problems:   S/P lumbar laminectomy   Discharged Condition: good  Hospital Course: Patient underwent right L 45 microdiscectomy, from which he did well with good relief of preoperative leg pain  Consults: None  Significant Diagnostic Studies: None  Treatments: surgery: right L 45 microdiscectomy  Discharge Exam: Blood pressure 102/62, pulse 79, temperature 97.7 F (36.5 C), temperature source Oral, resp. rate 18, height 6' 1.75" (1.873 m), weight 92.171 kg (203 lb 3.2 oz), SpO2 97 %. Neurologic: Alert and oriented X 3, normal strength and tone. Normal symmetric reflexes. Normal coordination and gait Wound:CDI  Disposition: Home     Medication List    TAKE these medications        acetaminophen 650 MG CR tablet  Commonly known as:  TYLENOL  Take 1,300 mg by mouth at bedtime.     carvedilol 12.5 MG tablet  Commonly known as:  COREG  Take 1 tablet (12.5 mg total) by mouth 2 (two) times daily with a meal.     cetirizine 10 MG tablet  Commonly known as:  ZYRTEC  Take 10 mg by mouth at bedtime.     ENTRESTO 24-26 MG  Generic drug:  sacubitril-valsartan  Take 1 tablet by mouth 2 (two) times daily.     furosemide 40 MG tablet  Commonly known as:  LASIX  Take 40 mg by mouth daily as needed for fluid.     HYDROcodone-acetaminophen 10-325 MG tablet  Commonly known as:  NORCO  Take 1 tablet by mouth every 8 (eight) hours as needed for moderate pain.     levothyroxine 125 MCG tablet  Commonly known as:  SYNTHROID, LEVOTHROID  Take 125 mcg by mouth daily.     RABEprazole 20 MG tablet  Commonly known as:  ACIPHEX  Take 1 tablet (20 mg total) by mouth 2 (two) times  daily.     traMADol 50 MG tablet  Commonly known as:  ULTRAM  Take 50 mg by mouth every 6 (six) hours as needed for moderate pain.         Signed: Peggyann Shoals, MD 01/05/2016, 6:42 AM

## 2016-01-05 NOTE — Progress Notes (Signed)
Subjective: Patient reports doing well  Objective: Vital signs in last 24 hours: Temp:  [97.5 F (36.4 C)-98.3 F (36.8 C)] 97.7 F (36.5 C) (02/03 2327) Pulse Rate:  [73-95] 79 (02/03 2327) Resp:  [16-30] 18 (02/03 2053) BP: (91-111)/(49-78) 102/62 mmHg (02/03 2053) SpO2:  [94 %-99 %] 97 % (02/03 2327) Weight:  [92.171 kg (203 lb 3.2 oz)] 92.171 kg (203 lb 3.2 oz) (02/03 1214)  Intake/Output from previous day: 02/03 0701 - 02/04 0700 In: 500 [I.V.:500] Out: 2 [Urine:2] Intake/Output this shift: Total I/O In: -  Out: 2 [Urine:2]  Physical Exam: Full strength, no numbness.  Dressing CDI  Lab Results: No results for input(s): WBC, HGB, HCT, PLT in the last 72 hours. BMET No results for input(s): NA, K, CL, CO2, GLUCOSE, BUN, CREATININE, CALCIUM in the last 72 hours.  Studies/Results: Dg Lumbar Spine 2-3 Views  01/04/2016  CLINICAL DATA:  Right L4-5 laminectomy and microdiscectomy for HNP. EXAM: LUMBAR SPINE - 2-3 VIEW COMPARISON:  CT of the lumbar spine 12/24/2015. FINDINGS: The needle is directed at the L4-5 interspace. Atherosclerotic calcifications are again noted. IMPRESSION: Intraoperative localization of the L4-5 interspace. Electronically Signed   By: San Morelle M.D.   On: 01/04/2016 15:25    Assessment/Plan: Patient doing well.  Discharge home       Peggyann Shoals, MD 01/05/2016, 6:41 AM

## 2016-01-08 ENCOUNTER — Encounter (HOSPITAL_COMMUNITY): Payer: Self-pay | Admitting: Neurological Surgery

## 2016-01-25 ENCOUNTER — Ambulatory Visit (INDEPENDENT_AMBULATORY_CARE_PROVIDER_SITE_OTHER): Payer: Managed Care, Other (non HMO) | Admitting: *Deleted

## 2016-01-25 ENCOUNTER — Encounter: Payer: Self-pay | Admitting: Internal Medicine

## 2016-01-25 DIAGNOSIS — I429 Cardiomyopathy, unspecified: Secondary | ICD-10-CM | POA: Diagnosis not present

## 2016-01-25 DIAGNOSIS — I428 Other cardiomyopathies: Secondary | ICD-10-CM

## 2016-01-25 DIAGNOSIS — I5022 Chronic systolic (congestive) heart failure: Secondary | ICD-10-CM | POA: Diagnosis not present

## 2016-01-25 LAB — CUP PACEART INCLINIC DEVICE CHECK
HighPow Impedance: 50 Ohm
Implantable Lead Implant Date: 20030925
Implantable Lead Implant Date: 20030925
Implantable Lead Location: 753859
Implantable Lead Location: 753860
Implantable Lead Model: 158
Implantable Lead Model: 5076
Lead Channel Impedance Value: 554 Ohm
Lead Channel Pacing Threshold Amplitude: 0.7 V
Lead Channel Pacing Threshold Pulse Width: 0.4 ms
Lead Channel Pacing Threshold Pulse Width: 0.8 ms
Lead Channel Sensing Intrinsic Amplitude: 15.8 mV
Lead Channel Setting Pacing Amplitude: 1.2 V
Lead Channel Setting Pacing Amplitude: 2.1 V
Lead Channel Setting Pacing Pulse Width: 0.8 ms
MDC IDC LEAD IMPLANT DT: 20030925
MDC IDC LEAD LOCATION: 753858
MDC IDC LEAD MODEL: 4513
MDC IDC LEAD SERIAL: 122241
MDC IDC LEAD SERIAL: 309118
MDC IDC MSMT LEADCHNL LV PACING THRESHOLD AMPLITUDE: 0.8 V
MDC IDC MSMT LEADCHNL LV SENSING INTR AMPL: 9.7 mV
MDC IDC MSMT LEADCHNL RA IMPEDANCE VALUE: 419 Ohm
MDC IDC MSMT LEADCHNL RA SENSING INTR AMPL: 0.9 mV
MDC IDC MSMT LEADCHNL RV IMPEDANCE VALUE: 476 Ohm
MDC IDC MSMT LEADCHNL RV PACING THRESHOLD AMPLITUDE: 0.8 V
MDC IDC MSMT LEADCHNL RV PACING THRESHOLD PULSEWIDTH: 0.4 ms
MDC IDC PG SERIAL: 588967
MDC IDC SESS DTM: 20170224050000
MDC IDC SET LEADCHNL LV SENSING SENSITIVITY: 1 mV
MDC IDC SET LEADCHNL RA PACING AMPLITUDE: 2 V
MDC IDC SET LEADCHNL RV PACING PULSEWIDTH: 0.4 ms
MDC IDC SET LEADCHNL RV SENSING SENSITIVITY: 0.6 mV
MDC IDC STAT BRADY RA PERCENT PACED: 1 % — AB
MDC IDC STAT BRADY RV PERCENT PACED: 97 %

## 2016-01-25 NOTE — Progress Notes (Signed)
CRT-D device check in office. Thresholds and sensing consistent with previous device measurements. Lead impedance trends stable over time. 24 mode switch episodes recorded (<1%)---AT per EGMs. (77) NSVT episodes recorded--max dur. 11bts per EGMs. Patient bi-ventricularly pacing 97% of the time. Device programmed with appropriate safety margins. No changes made this session. Estimated longevity 5 years. Patient will follow up with the [facility] in 3 months and with [facility] in 10-2016.

## 2016-03-07 ENCOUNTER — Other Ambulatory Visit: Payer: Self-pay | Admitting: Neurological Surgery

## 2016-03-07 ENCOUNTER — Other Ambulatory Visit (HOSPITAL_COMMUNITY): Payer: Self-pay | Admitting: Internal Medicine

## 2016-03-07 DIAGNOSIS — M542 Cervicalgia: Secondary | ICD-10-CM

## 2016-03-13 ENCOUNTER — Ambulatory Visit (HOSPITAL_COMMUNITY)
Admission: RE | Admit: 2016-03-13 | Discharge: 2016-03-13 | Disposition: A | Discharge status: Home / Self Care | Payer: Managed Care, Other (non HMO) | Source: Ambulatory Visit | Attending: Internal Medicine | Admitting: Internal Medicine

## 2016-03-13 ENCOUNTER — Other Ambulatory Visit: Payer: Managed Care, Other (non HMO)

## 2016-03-13 VITALS — BP 80/54 | HR 65 | Wt 207.0 lb

## 2016-03-13 DIAGNOSIS — Z6740 Type O blood, Rh positive: Secondary | ICD-10-CM | POA: Insufficient documentation

## 2016-03-13 DIAGNOSIS — R5383 Other fatigue: Secondary | ICD-10-CM | POA: Insufficient documentation

## 2016-03-13 DIAGNOSIS — E039 Hypothyroidism, unspecified: Secondary | ICD-10-CM | POA: Diagnosis not present

## 2016-03-13 DIAGNOSIS — Z95810 Presence of automatic (implantable) cardiac defibrillator: Secondary | ICD-10-CM | POA: Diagnosis not present

## 2016-03-13 DIAGNOSIS — I5022 Chronic systolic (congestive) heart failure: Secondary | ICD-10-CM | POA: Insufficient documentation

## 2016-03-13 DIAGNOSIS — I428 Other cardiomyopathies: Secondary | ICD-10-CM | POA: Diagnosis not present

## 2016-03-13 DIAGNOSIS — Z9221 Personal history of antineoplastic chemotherapy: Secondary | ICD-10-CM | POA: Insufficient documentation

## 2016-03-13 DIAGNOSIS — Z923 Personal history of irradiation: Secondary | ICD-10-CM | POA: Diagnosis not present

## 2016-03-13 DIAGNOSIS — I471 Supraventricular tachycardia: Secondary | ICD-10-CM | POA: Diagnosis not present

## 2016-03-13 DIAGNOSIS — K219 Gastro-esophageal reflux disease without esophagitis: Secondary | ICD-10-CM | POA: Insufficient documentation

## 2016-03-13 DIAGNOSIS — Z7682 Awaiting organ transplant status: Secondary | ICD-10-CM | POA: Insufficient documentation

## 2016-03-13 DIAGNOSIS — Z8572 Personal history of non-Hodgkin lymphomas: Secondary | ICD-10-CM | POA: Insufficient documentation

## 2016-03-13 DIAGNOSIS — Z79899 Other long term (current) drug therapy: Secondary | ICD-10-CM | POA: Diagnosis not present

## 2016-03-13 NOTE — Addendum Note (Signed)
Encounter addended by: Scarlette Calico, RN on: 03/13/2016 11:48 AM<BR>     Documentation filed: Patient Instructions Section

## 2016-03-13 NOTE — Patient Instructions (Signed)
If not feeling better next week give [place] a call back to schedule heart catheterization  Your physician recommends that you schedule a follow-up appointment in: 2 months

## 2016-03-13 NOTE — Progress Notes (Signed)
Patient ID: Glenn Gonzalez, male   DOB: Nov 15, 1961, 55 y.o.   MRN: [id number]  ADVANCED HF CLINIC NOTE  Patient ID: Glenn Gonzalez, male   DOB: [birth year], 55 y.o.   MRN: [id number] PCP: Dr Osborne Casco AHF Cardiologist: Amel Kitch  HPI: Glenn Gonzalez is a 55 y/o male with long h/o CHF due to NICM (? related to chemo/XRT) with EF 25% s/p CRT-D [facility])  PMHx notable for Hodgkins lymphona (treated age 49-23) Treated with high dose chemo - including adriamycin and XRT. Also has AF, VT, PUD and asthma. Initial EF 13%. Previously seen at [facility] transplant clinic and felt to be doing too well to require Tx. Cath with normal coronaries. Has not been able to tolerate spironolactone or Inspra due to painful gynecomastia requiring surgery. Stopped statin due to myalgias.  Follow up for HF: Has been feeling bad over the past 4 days, no changes in medicines. Taking lasix every few days, stable. Currently listed as a 2 on [facility] list.  Felt weaker on the elliptical this week. Can walk 1.3 mile walking in neighborhood in am but can't do it in PM. SBP 80-90s. Tolerating Entresto 24/26 tabs BID. Had several vaccinations in March along with labs which showed Iron low as below.  He does not have more SOB, just more fatigued. Has been seen at [facility] and told he wasn't   Blood type O+  Labs 02/28/16 Iron 41, Transferrin Saturation 12%, Hgb 15.0, Platelet 194  Echo 5/16 EF 15% RV ok  Studies:  R & LHC 6/16 Normal coronaries RA = 8 RV = 43/2/12 PA = 47/19 (30) PCW = 23 with v waves to 35 Fick cardiac output/index = 4.4/2.1 Thermo CO/CI = 3.3/1.6 PVR = 1.5 SVR = 1261 FA sat = 97% PA sat = 60%, 63%  RHC at [facility] 10/11/15 RA 5 PA mean 24 PCWP 17 CI  2.4   07/02/2011 ECHO 25% 05/2012 ECHO EF 25%  10/2012 Dr [name] reprogrammed BiV device due to PMT NSVT 08/12/13 EF 20-25% 5/16 Echo EF 15% RV normal  CPX 04/2011:  pVO2 17.3 (59% predicted) VeVCo2 slope 38.4 RER 1.14  CPX 02/28/13  PVO2 19 (60%  predicted) VEVCO2 slope 31. RER1.16  CPX 01/04/14 FVC 3.70 (67%)  FEV1 2.96 (69%)  Peak VO2: 22.5 (64.6% predicted peak VO2) VE/VCO2 slope: 26.3 Peak RER: 1.17  CPX 02/02/15 HR 61-> 100 BP 80/56 -> 106/58 PVO2 21.2 (62% predicted) VEVCO2 slope 34. RER1.16  CPX 06/28/15 Resting HR: 68 Peak HR: 102  (61% age predicted max HR) BP rest: 92/66 BP peak: 100/58 Peak VO2: 18.8 (54.6% predicted peak VO2) VE/VCO2 slope: 33.3 OUES: 1.68 Peak RER: 1.13 Ventilatory Threshold: 11.8 (34.3% predicted or measured peak VO2) VE/MVV: 45.4% O2pulse: 17  (89.5% predicted O2pulse)  CPX 12/12/15 Resting HR: 74 Peak HR: 100  (60% age predicted max HR) BP rest: 70/48 BP peak: 80/58 Peak VO2: 18.7 (55% predicted peak VO2) VE/VCO2 slope: 26.5 OUES: 2.00 Peak RER: 1.28 Ventilatory Threshold: 13.0 (38.2% predicted or measured peak VO2) Peak RR 29 Peak Ventilation: 63.9 VE/MVV: 41% PETCO2 at peak: 43 O2pulse: 17  (89.5% predicted O2pulse)  Labs  02/04/13 224  01/25/13 Pro BNP 2660 08/31/13 K 4.1 Creatinine 0.93 Pro BNP 1929  10/05/13 K 5.0 Creatinine 1.7 Pro BNP 250 01/09/14  K 4.2 Cr 1.14 9/15 K 4.9, creatinine 1.18, HCT 46.9, TSH normal   ROS: All systems negative except as listed in HPI, PMH and Problem List.  Past Medical History  Diagnosis Date  . CHF (congestive heart failure) ([place])   . A-fib ([place])   . GERD (gastroesophageal reflux disease)   . Pacemaker 2011    Copeland device/2011  . Hypothyroidism   . Depression   . Pneumonia     recurrent pneumonia sedf.  rad tx for lymphoma  . Asthma     pt. denies  . Hodgkin's lymphoma ([place]) 1980    IIIB. 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  Marland Kitchen History of radiation therapy   . History of kidney stones   . Shortness of breath dyspnea     with exertion    Current Outpatient Prescriptions  Medication Sig Dispense Refill  . acetaminophen (TYLENOL) 650 MG CR tablet Take 1,300 mg by mouth at  bedtime.     . carvedilol (COREG) 12.5 MG tablet Take 1 tablet (12.5 mg total) by mouth 2 (two) times daily with a meal. 180 tablet 3  . cetirizine (ZYRTEC) 10 MG tablet Take 10 mg by mouth at bedtime.     . furosemide (LASIX) 40 MG tablet Take 40 mg by mouth daily as needed for fluid.     Marland Kitchen levothyroxine (SYNTHROID, LEVOTHROID) 125 MCG tablet Take 125 mcg by mouth daily.  3  . RABEprazole (ACIPHEX) 20 MG tablet Take 1 tablet (20 mg total) by mouth 2 (two) times daily. 180 tablet 3  . sacubitril-valsartan (ENTRESTO) 24-26 MG Take 1 tablet by mouth 2 (two) times daily.      No current facility-administered medications for this encounter.    Filed Vitals:   03/13/16 1018  BP: 80/54  Pulse: 65  Weight: 207 lb (93.895 kg)  SpO2: 97%   Wt Readings from Last 3 Encounters:  03/13/16 207 lb (93.895 kg)  01/04/16 203 lb 3.2 oz (92.171 kg)  01/01/16 203 lb 3.2 oz (92.171 kg)     PHYSICAL EXAM: General:  Well appearing. No resp difficulty  HEENT: normal Neck: supple. JVP 6-7. Carotids 2+ bilaterally; no bruits. No thyromegaly or nodule noted Cor: PMI normal. RRR. No rubs, gallops or murmurs. Lungs: CTAB, normal effort Abdomen: soft, NT, ND, no HSM. No bruits or masses. +BS  Extremities: no cyanosis, clubbing, rash, trace ankle edema Neuro: alert & orientedx3, cranial nerves grossly intact. Moves all 4 extremities w/o difficulty. Affect pleasant.   ASSESSMENT & PLAN:  1. Chronic systolic CHF: NICM, EF 84% (04/2015), thought to be from chemo (adriamycin), s/p Medtronic CRT-D. CPX 1/17 Peak VO2: 18.7 (55% predicted peak VO2) VE/VCO2 slope: 26.5 -- Continues with stable NYHA III-IIIB symptoms. Volume status looks good.  -- Continue current meds. Continue Entresto 24/26 as tolerated -- Remains listed as a 2 on Transplant List at [facility].  Need to continue follow closely.  2. Palpitations - resolved with reprogramming of ICD/CRT 3. History of lymphoma treated ( Age 70-23)  with adriamycin 4.  Hypothyroid: On synthroid. Per PCP Dr Osborne Casco.  5. Atrial tachycardia:  This was found on ICD interrogation by Dr [name].  Detection threshold for tachyarrhythmias decreased to 130 bpm to make detection of AT episodes easier. He saw Dr. [name] last month and was doing well.  6. Blood type O+ 7. Fatigue   Shirley Friar, PA-C 03/13/2016   Patient seen and examined with Oda Kilts, PA-C. We discussed all aspects of the encounter. I agree with the assessment and plan as stated above.   He has profound fatigue. NYHA III-IIIB. Volume status ok. Likely getting near the  point for advanced therapies but CPX and RHC data have not met criteria. Has been see at [facility] and [facility] and also felt to be on the bubble. If fatigue persists would plan repeat RHC next week through the brachial approach. In the past, thermodilution had yielded lower COs then Fick so would do Fick with direct measurement of O2 consumption with metabolic cart to avoid overestimation. Discussed options of transplant (including Ia time option with IABP), VAD or chronic inotropes.   Nikkita Adeyemi,MD 11:34 AM

## 2016-03-14 ENCOUNTER — Ambulatory Visit
Admission: RE | Admit: 2016-03-14 | Discharge: 2016-03-14 | Disposition: A | Discharge status: Home / Self Care | Payer: Managed Care, Other (non HMO) | Source: Ambulatory Visit | Attending: Neurological Surgery | Admitting: Neurological Surgery

## 2016-03-14 DIAGNOSIS — M542 Cervicalgia: Secondary | ICD-10-CM

## 2016-03-17 ENCOUNTER — Encounter (HOSPITAL_COMMUNITY): Payer: Managed Care, Other (non HMO) | Admitting: Internal Medicine

## 2016-03-18 ENCOUNTER — Ambulatory Visit (INDEPENDENT_AMBULATORY_CARE_PROVIDER_SITE_OTHER): Payer: Managed Care, Other (non HMO) | Admitting: Podiatry

## 2016-03-18 ENCOUNTER — Encounter: Payer: Self-pay | Admitting: Podiatry

## 2016-03-18 VITALS — BP 79/48 | HR 62 | Resp 12

## 2016-03-18 DIAGNOSIS — B351 Tinea unguium: Secondary | ICD-10-CM

## 2016-03-18 NOTE — Patient Instructions (Signed)
Today we took a portion of the left great toenail and will submit it to the lab for PAS stain and fungal culture. The culture could take 30+ days. We will contact you when we have the results and if positive will reschedule for consult discuss the use of oral medication.

## 2016-03-18 NOTE — Progress Notes (Signed)
   Subjective:    Patient ID: Glenn Gonzalez, male    DOB: 1961-10-01, 55 y.o.   MRN: [id number]  HPI   This patient presents today complaining of approximately 3 year history of gradual thickening and color change in the right and left hallux toenails. Patient has been applying tea tree oil to the hallux toenails daily for 1 year without any change of texture and/or appearance. He recalls some years ago that he took oral medication for a similar problem which resolved the texture and color change in the toenails until present.  Review of Systems  Constitutional: Positive for fatigue.  Skin: Positive for color change.       Objective:   Physical Exam  Orientated 3  Vascular: No peripheral edema bilaterally DP and PT pulses 2/4 bilaterally Capillary reflex immediate bilaterally  Neurological: Sensation to 10 g monofilament wire intact 5/5 right and 4/5 left Vibratory sensation nonreactive right reactive left Ankle reflex equal and reactive bilaterally  Dermatological: No open skin lesions bilaterally The hallux toenails are partially elevating off the nailbed with texture and color changes throughout the entire nail plates The remaining toenails appear normal trophic  Musculoskeletal: The medial left navicular areas prominent There is no restriction ankle, subtalar, midtarsal joints bilaterally    Assessment & Plan:   Assessment: Mycotic hallux toenails  Plan: Today I discussed the possibility of using oral medication if a lab culture and/or PAS was positive. Patient said he was interested in using possible oral medication because of previous positive response. The left hallux nail was debrided approximately distal one third and the nail fragments were submitted for PAS and fungal culture  Notify patient on receipt of lab

## 2016-03-27 ENCOUNTER — Other Ambulatory Visit (HOSPITAL_COMMUNITY): Payer: Self-pay | Admitting: *Deleted

## 2016-03-27 ENCOUNTER — Encounter (HOSPITAL_COMMUNITY): Payer: Self-pay | Admitting: *Deleted

## 2016-03-27 DIAGNOSIS — I5022 Chronic systolic (congestive) heart failure: Secondary | ICD-10-CM

## 2016-04-02 ENCOUNTER — Encounter (HOSPITAL_COMMUNITY): Payer: Self-pay | Admitting: Internal Medicine

## 2016-04-02 ENCOUNTER — Encounter (HOSPITAL_COMMUNITY)
Admission: RE | Disposition: A | Discharge status: Home / Self Care | Payer: Self-pay | Source: Ambulatory Visit | Attending: Internal Medicine

## 2016-04-02 ENCOUNTER — Telehealth (HOSPITAL_COMMUNITY): Payer: Self-pay | Admitting: *Deleted

## 2016-04-02 ENCOUNTER — Ambulatory Visit (HOSPITAL_COMMUNITY)
Admission: RE | Admit: 2016-04-02 | Discharge: 2016-04-02 | Disposition: A | Discharge status: Home / Self Care | Payer: Managed Care, Other (non HMO) | Source: Ambulatory Visit | Attending: Internal Medicine | Admitting: Internal Medicine

## 2016-04-02 DIAGNOSIS — Z7682 Awaiting organ transplant status: Secondary | ICD-10-CM | POA: Diagnosis not present

## 2016-04-02 DIAGNOSIS — Z9221 Personal history of antineoplastic chemotherapy: Secondary | ICD-10-CM | POA: Diagnosis not present

## 2016-04-02 DIAGNOSIS — I471 Supraventricular tachycardia: Secondary | ICD-10-CM | POA: Insufficient documentation

## 2016-04-02 DIAGNOSIS — E039 Hypothyroidism, unspecified: Secondary | ICD-10-CM | POA: Diagnosis not present

## 2016-04-02 DIAGNOSIS — K219 Gastro-esophageal reflux disease without esophagitis: Secondary | ICD-10-CM | POA: Insufficient documentation

## 2016-04-02 DIAGNOSIS — Z8571 Personal history of Hodgkin lymphoma: Secondary | ICD-10-CM | POA: Diagnosis not present

## 2016-04-02 DIAGNOSIS — I4891 Unspecified atrial fibrillation: Secondary | ICD-10-CM | POA: Insufficient documentation

## 2016-04-02 DIAGNOSIS — I5022 Chronic systolic (congestive) heart failure: Secondary | ICD-10-CM | POA: Diagnosis present

## 2016-04-02 DIAGNOSIS — F329 Major depressive disorder, single episode, unspecified: Secondary | ICD-10-CM | POA: Insufficient documentation

## 2016-04-02 DIAGNOSIS — J45909 Unspecified asthma, uncomplicated: Secondary | ICD-10-CM | POA: Diagnosis not present

## 2016-04-02 DIAGNOSIS — Z950 Presence of cardiac pacemaker: Secondary | ICD-10-CM | POA: Diagnosis not present

## 2016-04-02 DIAGNOSIS — Z923 Personal history of irradiation: Secondary | ICD-10-CM | POA: Insufficient documentation

## 2016-04-02 DIAGNOSIS — I429 Cardiomyopathy, unspecified: Secondary | ICD-10-CM | POA: Insufficient documentation

## 2016-04-02 DIAGNOSIS — Z87442 Personal history of urinary calculi: Secondary | ICD-10-CM | POA: Insufficient documentation

## 2016-04-02 HISTORY — PX: CARDIAC CATHETERIZATION: SHX172

## 2016-04-02 LAB — CBC
HEMATOCRIT: 43.3 % (ref 39.0–52.0)
Hemoglobin: 14.1 g/dL (ref 13.0–17.0)
MCH: 30.7 pg (ref 26.0–34.0)
MCHC: 32.6 g/dL (ref 30.0–36.0)
MCV: 94.3 fL (ref 78.0–100.0)
PLATELETS: 203 10*3/uL (ref 150–400)
RBC: 4.59 MIL/uL (ref 4.22–5.81)
RDW: 14.3 % (ref 11.5–15.5)
WBC: 5.5 10*3/uL (ref 4.0–10.5)

## 2016-04-02 LAB — POCT I-STAT 3, VENOUS BLOOD GAS (G3P V)
ACID-BASE DEFICIT: 1 mmol/L (ref 0.0–2.0)
ACID-BASE DEFICIT: 1 mmol/L (ref 0.0–2.0)
BICARBONATE: 25.3 meq/L — AB (ref 20.0–24.0)
Bicarbonate: 25.1 mEq/L — ABNORMAL HIGH (ref 20.0–24.0)
O2 SAT: 53 %
O2 Saturation: 52 %
PH VEN: 7.359 — AB (ref 7.250–7.300)
PH VEN: 7.366 — AB (ref 7.250–7.300)
PO2 VEN: 29 mmHg — AB (ref 31.0–45.0)
TCO2: 26 mmol/L (ref 0–100)
TCO2: 27 mmol/L (ref 0–100)
pCO2, Ven: 43.9 mmHg — ABNORMAL LOW (ref 45.0–50.0)
pCO2, Ven: 44.9 mmHg — ABNORMAL LOW (ref 45.0–50.0)
pO2, Ven: 29 mmHg — ABNORMAL LOW (ref 31.0–45.0)

## 2016-04-02 LAB — BASIC METABOLIC PANEL
Anion gap: 6 (ref 5–15)
BUN: 28 mg/dL — AB (ref 6–20)
CO2: 25 mmol/L (ref 22–32)
CREATININE: 1.18 mg/dL (ref 0.61–1.24)
Calcium: 8.9 mg/dL (ref 8.9–10.3)
Chloride: 108 mmol/L (ref 101–111)
GFR calc Af Amer: >60 mL/min (ref >60)
GLUCOSE: 98 mg/dL (ref 65–99)
Potassium: 4.6 mmol/L (ref 3.5–5.1)
SODIUM: 139 mmol/L (ref 135–145)

## 2016-04-02 LAB — PROTIME-INR
INR: 1.02 (ref 0.00–1.49)
Prothrombin Time: 13.6 seconds (ref 11.6–15.2)

## 2016-04-02 SURGERY — RIGHT HEART CATH
Anesthesia: LOCAL

## 2016-04-02 MED ORDER — SODIUM CHLORIDE 0.9% FLUSH
3.0000 mL | Freq: Two times a day (BID) | INTRAVENOUS | Status: DC
Start: 2016-04-02 — End: 2016-04-02

## 2016-04-02 MED ORDER — ONDANSETRON HCL 4 MG/2ML IJ SOLN
4.0000 mg | Freq: Four times a day (QID) | INTRAMUSCULAR | Status: DC | PRN
Start: 2016-04-02 — End: 2016-04-02

## 2016-04-02 MED ORDER — SODIUM CHLORIDE 0.9% FLUSH: 3.0000 mL | Freq: Two times a day (BID) | INTRAVENOUS | Status: DC

## 2016-04-02 MED ORDER — ASPIRIN 81 MG PO CHEW: 81.0000 mg | CHEWABLE_TABLET | ORAL | Status: DC

## 2016-04-02 MED ORDER — SODIUM CHLORIDE 0.9 % IV SOLN: INTRAVENOUS | Status: DC

## 2016-04-02 MED ORDER — HEPARIN (PORCINE) IN NACL 2-0.9 UNIT/ML-% IJ SOLN
INTRAMUSCULAR | Status: AC
  Filled 2016-04-02: qty 500

## 2016-04-02 MED ORDER — LIDOCAINE HCL (PF) 1 % IJ SOLN
INTRAMUSCULAR | Status: DC | PRN
  Administered 2016-04-02: 15 mL

## 2016-04-02 MED ORDER — SODIUM CHLORIDE 0.9% FLUSH: 3.0000 mL | INTRAVENOUS | Status: DC | PRN

## 2016-04-02 MED ORDER — ACETAMINOPHEN 325 MG PO TABS: 650.0000 mg | ORAL_TABLET | ORAL | Status: DC | PRN

## 2016-04-02 MED ORDER — SODIUM CHLORIDE 0.9 % IV SOLN: 250.0000 mL | INTRAVENOUS | Status: DC | PRN

## 2016-04-02 MED ORDER — LIDOCAINE HCL (PF) 1 % IJ SOLN
INTRAMUSCULAR | Status: AC
  Filled 2016-04-02: qty 30

## 2016-04-02 MED ORDER — HEPARIN (PORCINE) IN NACL 2-0.9 UNIT/ML-% IJ SOLN
INTRAMUSCULAR | Status: DC | PRN
  Administered 2016-04-02: 11:00:00

## 2016-04-02 SURGICAL SUPPLY — 7 items
CATH SWAN GANZ 7F STRAIGHT (CATHETERS) ×1 IMPLANT
KIT HEART LEFT (KITS) ×2 IMPLANT
PACK CARDIAC CATHETERIZATION (CUSTOM PROCEDURE TRAY) ×2 IMPLANT
SHEATH PINNACLE 7F 10CM (SHEATH) ×1 IMPLANT
TRANSDUCER W/STOPCOCK (MISCELLANEOUS) ×3 IMPLANT
TUBING ART PRESS 72  MALE/FEM (TUBING) ×1
TUBING ART PRESS 72 MALE/FEM (TUBING) IMPLANT

## 2016-04-02 NOTE — H&P (View-Only) (Signed)
Patient ID: PRESTIN MUNCH, male   DOB: Nov 15, 1961, 55 y.o.   MRN: [id number]  ADVANCED HF CLINIC NOTE  Patient ID: KOLDEN DUPEE, male   DOB: [birth year], 55 y.o.   MRN: [id number] PCP: Dr Osborne Casco AHF Cardiologist: Bensimhon  HPI: Ed is a 55 y/o male with long h/o CHF due to NICM (? related to chemo/XRT) with EF 25% s/p CRT-D [facility])  PMHx notable for Hodgkins lymphona (treated age 49-23) Treated with high dose chemo - including adriamycin and XRT. Also has AF, VT, PUD and asthma. Initial EF 13%. Previously seen at [facility] transplant clinic and felt to be doing too well to require Tx. Cath with normal coronaries. Has not been able to tolerate spironolactone or Inspra due to painful gynecomastia requiring surgery. Stopped statin due to myalgias.  Follow up for HF: Has been feeling bad over the past 4 days, no changes in medicines. Taking lasix every few days, stable. Currently listed as a 2 on [facility] list.  Felt weaker on the elliptical this week. Can walk 1.3 mile walking in neighborhood in am but can't do it in PM. SBP 80-90s. Tolerating Entresto 24/26 tabs BID. Had several vaccinations in March along with labs which showed Iron low as below.  He does not have more SOB, just more fatigued. Has been seen at [facility] and told he wasn't   Blood type O+  Labs 02/28/16 Iron 41, Transferrin Saturation 12%, Hgb 15.0, Platelet 194  Echo 5/16 EF 15% RV ok  Studies:  R & LHC 6/16 Normal coronaries RA = 8 RV = 43/2/12 PA = 47/19 (30) PCW = 23 with v waves to 35 Fick cardiac output/index = 4.4/2.1 Thermo CO/CI = 3.3/1.6 PVR = 1.5 SVR = 1261 FA sat = 97% PA sat = 60%, 63%  RHC at [facility] 10/11/15 RA 5 PA mean 24 PCWP 17 CI  2.4   07/02/2011 ECHO 25% 05/2012 ECHO EF 25%  10/2012 Dr [name] reprogrammed BiV device due to PMT NSVT 08/12/13 EF 20-25% 5/16 Echo EF 15% RV normal  CPX 04/2011:  pVO2 17.3 (59% predicted) VeVCo2 slope 38.4 RER 1.14  CPX 02/28/13  PVO2 19 (60%  predicted) VEVCO2 slope 31. RER1.16  CPX 01/04/14 FVC 3.70 (67%)  FEV1 2.96 (69%)  Peak VO2: 22.5 (64.6% predicted peak VO2) VE/VCO2 slope: 26.3 Peak RER: 1.17  CPX 02/02/15 HR 61-> 100 BP 80/56 -> 106/58 PVO2 21.2 (62% predicted) VEVCO2 slope 34. RER1.16  CPX 06/28/15 Resting HR: 68 Peak HR: 102  (61% age predicted max HR) BP rest: 92/66 BP peak: 100/58 Peak VO2: 18.8 (54.6% predicted peak VO2) VE/VCO2 slope: 33.3 OUES: 1.68 Peak RER: 1.13 Ventilatory Threshold: 11.8 (34.3% predicted or measured peak VO2) VE/MVV: 45.4% O2pulse: 17  (89.5% predicted O2pulse)  CPX 12/12/15 Resting HR: 74 Peak HR: 100  (60% age predicted max HR) BP rest: 70/48 BP peak: 80/58 Peak VO2: 18.7 (55% predicted peak VO2) VE/VCO2 slope: 26.5 OUES: 2.00 Peak RER: 1.28 Ventilatory Threshold: 13.0 (38.2% predicted or measured peak VO2) Peak RR 29 Peak Ventilation: 63.9 VE/MVV: 41% PETCO2 at peak: 43 O2pulse: 17  (89.5% predicted O2pulse)  Labs  02/04/13 224  01/25/13 Pro BNP 2660 08/31/13 K 4.1 Creatinine 0.93 Pro BNP 1929  10/05/13 K 5.0 Creatinine 1.7 Pro BNP 250 01/09/14  K 4.2 Cr 1.14 9/15 K 4.9, creatinine 1.18, HCT 46.9, TSH normal   ROS: All systems negative except as listed in HPI, PMH and Problem List.  Past Medical History  Diagnosis Date  . CHF (congestive heart failure) ([place])   . A-fib ([place])   . GERD (gastroesophageal reflux disease)   . Pacemaker 2011    Copeland device/2011  . Hypothyroidism   . Depression   . Pneumonia     recurrent pneumonia sedf.  rad tx for lymphoma  . Asthma     pt. denies  . Hodgkin's lymphoma ([place]) 1980    IIIB. 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  Marland Kitchen History of radiation therapy   . History of kidney stones   . Shortness of breath dyspnea     with exertion    Current Outpatient Prescriptions  Medication Sig Dispense Refill  . acetaminophen (TYLENOL) 650 MG CR tablet Take 1,300 mg by mouth at  bedtime.     . carvedilol (COREG) 12.5 MG tablet Take 1 tablet (12.5 mg total) by mouth 2 (two) times daily with a meal. 180 tablet 3  . cetirizine (ZYRTEC) 10 MG tablet Take 10 mg by mouth at bedtime.     . furosemide (LASIX) 40 MG tablet Take 40 mg by mouth daily as needed for fluid.     Marland Kitchen levothyroxine (SYNTHROID, LEVOTHROID) 125 MCG tablet Take 125 mcg by mouth daily.  3  . RABEprazole (ACIPHEX) 20 MG tablet Take 1 tablet (20 mg total) by mouth 2 (two) times daily. 180 tablet 3  . sacubitril-valsartan (ENTRESTO) 24-26 MG Take 1 tablet by mouth 2 (two) times daily.      No current facility-administered medications for this encounter.    Filed Vitals:   03/13/16 1018  BP: 80/54  Pulse: 65  Weight: 207 lb (93.895 kg)  SpO2: 97%   Wt Readings from Last 3 Encounters:  03/13/16 207 lb (93.895 kg)  01/04/16 203 lb 3.2 oz (92.171 kg)  01/01/16 203 lb 3.2 oz (92.171 kg)     PHYSICAL EXAM: General:  Well appearing. No resp difficulty  HEENT: normal Neck: supple. JVP 6-7. Carotids 2+ bilaterally; no bruits. No thyromegaly or nodule noted Cor: PMI normal. RRR. No rubs, gallops or murmurs. Lungs: CTAB, normal effort Abdomen: soft, NT, ND, no HSM. No bruits or masses. +BS  Extremities: no cyanosis, clubbing, rash, trace ankle edema Neuro: alert & orientedx3, cranial nerves grossly intact. Moves all 4 extremities w/o difficulty. Affect pleasant.   ASSESSMENT & PLAN:  1. Chronic systolic CHF: NICM, EF 84% (04/2015), thought to be from chemo (adriamycin), s/p Medtronic CRT-D. CPX 1/17 Peak VO2: 18.7 (55% predicted peak VO2) VE/VCO2 slope: 26.5 -- Continues with stable NYHA III-IIIB symptoms. Volume status looks good.  -- Continue current meds. Continue Entresto 24/26 as tolerated -- Remains listed as a 2 on Transplant List at [facility].  Need to continue follow closely.  2. Palpitations - resolved with reprogramming of ICD/CRT 3. History of lymphoma treated ( Age 70-23)  with adriamycin 4.  Hypothyroid: On synthroid. Per PCP Dr Osborne Casco.  5. Atrial tachycardia:  This was found on ICD interrogation by Dr [name].  Detection threshold for tachyarrhythmias decreased to 130 bpm to make detection of AT episodes easier. He saw Dr. [name] last month and was doing well.  6. Blood type O+ 7. Fatigue   Shirley Friar, PA-C 03/13/2016   Patient seen and examined with Oda Kilts, PA-C. We discussed all aspects of the encounter. I agree with the assessment and plan as stated above.   He has profound fatigue. NYHA III-IIIB. Volume status ok. Likely getting near the  point for advanced therapies but CPX and RHC data have not met criteria. Has been see at [facility] and [facility] and also felt to be on the bubble. If fatigue persists would plan repeat RHC next week through the brachial approach. In the past, thermodilution had yielded lower COs then Fick so would do Fick with direct measurement of O2 consumption with metabolic cart to avoid overestimation. Discussed options of transplant (including Ia time option with IABP), VAD or chronic inotropes.   Bensimhon, Daniel,MD 11:34 AM

## 2016-04-02 NOTE — Telephone Encounter (Signed)
Started case for RHC performed today. Was placed on a 79min hold and no one answered so I faxed in clinical information to [employer] precert dept. [phone]. Case [id number]

## 2016-04-02 NOTE — Interval H&P Note (Signed)
History and Physical Interval Note:  04/02/2016 10:30 AM  Glenn Gonzalez  has presented today for surgery, with the diagnosis of chf  The various methods of treatment have been discussed with the patient and family. After consideration of risks, benefits and other options for treatment, the patient has consented to  Procedure(s): Right Heart Cath (N/A) as a surgical intervention .  The patient's history has been reviewed, patient examined, no change in status, stable for surgery.  I have reviewed the patient's chart and labs.  Questions were answered to the patient's satisfaction.     Kursten Kruk, Quillian Quince

## 2016-04-02 NOTE — Discharge Instructions (Signed)
Angiogram, Care After °Refer to this sheet in the next few weeks. These instructions provide you with information about caring for yourself after your procedure. Your health care provider may also give you more specific instructions. Your treatment has been planned according to current medical practices, but problems sometimes occur. Call your health care provider if you have any problems or questions after your procedure. °WHAT TO EXPECT AFTER THE PROCEDURE °After your procedure, it is typical to have the following: °· Bruising at the catheter insertion site that usually fades within 1-2 weeks. °· Blood collecting in the tissue (hematoma) that may be painful to the touch. It should usually decrease in size and tenderness within 1-2 weeks. °HOME CARE INSTRUCTIONS °· Take medicines only as directed by your health care provider. °· You may shower 24-48 hours after the procedure or as directed by your health care provider. Remove the bandage (dressing) and gently wash the site with plain soap and water. Pat the area dry with a clean towel. Do not rub the site, because this may cause bleeding. °· Do not take baths, swim, or use a hot tub until your health care provider approves. °· Check your insertion site every day for redness, swelling, or drainage. °· Do not apply powder or lotion to the site. °· Do not lift over 10 lb (4.5 kg) for 5 days after your procedure or as directed by your health care provider. °· Ask your health care provider when it is okay to: °¨ Return to work or school. °¨ Resume usual physical activities or sports. °¨ Resume sexual activity. °· Do not drive home if you are discharged the same day as the procedure. Have someone else drive you. °· You may drive 24 hours after the procedure unless otherwise instructed by your health care provider. °· Do not operate machinery or power tools for 24 hours after the procedure or as directed by your health care provider. °· If your procedure was done as an  outpatient procedure, which means that you went home the same day as your procedure, a responsible adult should be with you for the first 24 hours after you arrive home. °· Keep all follow-up visits as directed by your health care provider. This is important. °SEEK MEDICAL CARE IF: °· You have a fever. °· You have chills. °· You have increased bleeding from the catheter insertion site. Hold pressure on the site. °SEEK IMMEDIATE MEDICAL CARE IF: °· You have unusual pain at the catheter insertion site. °· You have redness, warmth, or swelling at the catheter insertion site. °· You have drainage (other than a small amount of blood on the dressing) from the catheter insertion site. °· The catheter insertion site is bleeding, and the bleeding does not stop after 30 minutes of holding steady pressure on the site. °· The area near or just beyond the catheter insertion site becomes pale, cool, tingly, or numb. °  °This information is not intended to replace advice given to you by your health care provider. Make sure you discuss any questions you have with your health care provider. °  °Document Released: 06/05/2005 Document Revised: 12/08/2014 Document Reviewed: 04/20/2013 °Elsevier Interactive Patient Education ©2016 [employer]. ° °

## 2016-04-04 NOTE — Telephone Encounter (Signed)
Submitted additional clinical information to [employer] this morning at 8:30am via fax

## 2016-04-07 NOTE — Telephone Encounter (Signed)
Patient called requesting to speak with Dr. Haroldine Laws in regards to choosing possible transplant center. Dr. Shelbie Proctor made aware and will return call to patient to discuss this matter this afternoon.  Renee Pain

## 2016-04-08 ENCOUNTER — Telehealth (HOSPITAL_COMMUNITY): Payer: Self-pay | Admitting: Vascular Surgery

## 2016-04-08 NOTE — Telephone Encounter (Signed)
Pt would like to speak to /and schedule a sooner appt w/ DB. He needs DB to help him make a decision about a transplant hospital please advise

## 2016-04-08 NOTE — Telephone Encounter (Signed)
Dr Haroldine Laws aware and said he would call pt

## 2016-04-15 ENCOUNTER — Other Ambulatory Visit (INDEPENDENT_AMBULATORY_CARE_PROVIDER_SITE_OTHER): Payer: Managed Care, Other (non HMO) | Admitting: *Deleted

## 2016-04-15 ENCOUNTER — Telehealth: Payer: Self-pay | Admitting: *Deleted

## 2016-04-15 ENCOUNTER — Telehealth (HOSPITAL_COMMUNITY): Payer: Self-pay | Admitting: *Deleted

## 2016-04-15 DIAGNOSIS — I4729 Other ventricular tachycardia: Secondary | ICD-10-CM

## 2016-04-15 DIAGNOSIS — I5022 Chronic systolic (congestive) heart failure: Secondary | ICD-10-CM

## 2016-04-15 DIAGNOSIS — G903 Multi-system degeneration of the autonomic nervous system: Secondary | ICD-10-CM

## 2016-04-15 DIAGNOSIS — I428 Other cardiomyopathies: Secondary | ICD-10-CM

## 2016-04-15 DIAGNOSIS — I472 Ventricular tachycardia: Secondary | ICD-10-CM | POA: Diagnosis not present

## 2016-04-15 LAB — BASIC METABOLIC PANEL
BUN: 35 mg/dL — AB (ref 7–25)
CALCIUM: 8.9 mg/dL (ref 8.6–10.3)
CHLORIDE: 102 mmol/L (ref 98–110)
CO2: 23 mmol/L (ref 20–31)
CREATININE: 1.29 mg/dL (ref 0.70–1.33)
Glucose, Bld: 109 mg/dL — ABNORMAL HIGH (ref 65–99)
Potassium: 5 mmol/L (ref 3.5–5.3)
Sodium: 137 mmol/L (ref 135–146)

## 2016-04-15 NOTE — Telephone Encounter (Signed)
Pt at [facility] for labs, he states [facility] told him to get his kidney function checked, advised we were not aware but order placed for bmet

## 2016-04-15 NOTE — Telephone Encounter (Signed)
Dr. Amalia Hailey reviewed 03/18/2016 fungal culture results as negative, and may want to allow area to grow out over 2-3 months and have another sample sent or treat with [id number]. I informed pt and he said he'd think about it.

## 2016-04-22 ENCOUNTER — Telehealth (HOSPITAL_COMMUNITY): Payer: Self-pay | Admitting: *Deleted

## 2016-04-22 NOTE — Telephone Encounter (Signed)
bmet from last week faxed to [facility], Dr Mosetta Pigeon at [phone]

## 2016-04-23 ENCOUNTER — Telehealth (HOSPITAL_COMMUNITY): Payer: Self-pay | Admitting: Surgery

## 2016-04-23 ENCOUNTER — Other Ambulatory Visit (INDEPENDENT_AMBULATORY_CARE_PROVIDER_SITE_OTHER): Payer: Managed Care, Other (non HMO) | Admitting: *Deleted

## 2016-04-23 DIAGNOSIS — G903 Multi-system degeneration of the autonomic nervous system: Secondary | ICD-10-CM | POA: Diagnosis not present

## 2016-04-23 DIAGNOSIS — I4719 Other supraventricular tachycardia: Secondary | ICD-10-CM

## 2016-04-23 DIAGNOSIS — I472 Ventricular tachycardia, unspecified: Secondary | ICD-10-CM

## 2016-04-23 DIAGNOSIS — I5022 Chronic systolic (congestive) heart failure: Secondary | ICD-10-CM

## 2016-04-23 DIAGNOSIS — I471 Supraventricular tachycardia: Secondary | ICD-10-CM

## 2016-04-23 DIAGNOSIS — I4729 Other ventricular tachycardia: Secondary | ICD-10-CM

## 2016-04-23 LAB — BASIC METABOLIC PANEL
BUN: 23 mg/dL (ref 7–25)
CO2: 28 mmol/L (ref 20–31)
Calcium: 8.9 mg/dL (ref 8.6–10.3)
Chloride: 98 mmol/L (ref 98–110)
Creat: 1.3 mg/dL (ref 0.70–1.33)
GLUCOSE: 97 mg/dL (ref 65–99)
POTASSIUM: 3.9 mmol/L (ref 3.5–5.3)
Sodium: 137 mmol/L (ref 135–146)

## 2016-04-23 NOTE — Telephone Encounter (Signed)
[facility] Nurse called requesting an appt at [facility] to have labs drawn today  Order placed

## 2016-04-23 NOTE — Addendum Note (Signed)
Addended by: Eulis Foster on: 04/23/2016 11:46 AM   Modules accepted: Orders

## 2016-04-30 ENCOUNTER — Encounter: Payer: Self-pay | Admitting: Internal Medicine

## 2016-04-30 ENCOUNTER — Ambulatory Visit (INDEPENDENT_AMBULATORY_CARE_PROVIDER_SITE_OTHER): Payer: Managed Care, Other (non HMO) | Admitting: *Deleted

## 2016-04-30 DIAGNOSIS — I5022 Chronic systolic (congestive) heart failure: Secondary | ICD-10-CM

## 2016-04-30 DIAGNOSIS — I429 Cardiomyopathy, unspecified: Secondary | ICD-10-CM

## 2016-04-30 DIAGNOSIS — I428 Other cardiomyopathies: Secondary | ICD-10-CM

## 2016-04-30 LAB — CUP PACEART INCLINIC DEVICE CHECK
HighPow Impedance: 56 Ohm
Implantable Lead Implant Date: 20030925
Implantable Lead Location: 753859
Implantable Lead Model: 158
Implantable Lead Model: 4513
Implantable Lead Model: 5076
Implantable Lead Serial Number: 122241
Implantable Lead Serial Number: 309118
Lead Channel Impedance Value: 484 Ohm
Lead Channel Pacing Threshold Amplitude: 0.7 V
Lead Channel Pacing Threshold Amplitude: 0.9 V
Lead Channel Pacing Threshold Amplitude: 1.1 V
Lead Channel Pacing Threshold Pulse Width: 0.4 ms
Lead Channel Pacing Threshold Pulse Width: 0.8 ms
Lead Channel Sensing Intrinsic Amplitude: 11.4 mV
Lead Channel Sensing Intrinsic Amplitude: 17.7 mV
Lead Channel Setting Pacing Amplitude: 1.2 V
Lead Channel Setting Pacing Amplitude: 2.1 V
Lead Channel Setting Sensing Sensitivity: 1 mV
MDC IDC LEAD IMPLANT DT: 20030925
MDC IDC LEAD IMPLANT DT: 20030925
MDC IDC LEAD LOCATION: 753858
MDC IDC LEAD LOCATION: 753860
MDC IDC MSMT LEADCHNL LV IMPEDANCE VALUE: 545 Ohm
MDC IDC MSMT LEADCHNL RA IMPEDANCE VALUE: 448 Ohm
MDC IDC MSMT LEADCHNL RA PACING THRESHOLD PULSEWIDTH: 0.4 ms
MDC IDC MSMT LEADCHNL RA SENSING INTR AMPL: 1.3 mV
MDC IDC PG SERIAL: 588967
MDC IDC SESS DTM: 20170531040000
MDC IDC SET LEADCHNL LV PACING PULSEWIDTH: 0.8 ms
MDC IDC SET LEADCHNL RA PACING AMPLITUDE: 2 V
MDC IDC SET LEADCHNL RV PACING PULSEWIDTH: 0.4 ms
MDC IDC SET LEADCHNL RV SENSING SENSITIVITY: 0.6 mV
MDC IDC STAT BRADY RA PERCENT PACED: 1 % — AB
MDC IDC STAT BRADY RV PERCENT PACED: 69 %

## 2016-05-06 ENCOUNTER — Encounter: Payer: Self-pay | Admitting: Internal Medicine

## 2016-05-06 ENCOUNTER — Ambulatory Visit (INDEPENDENT_AMBULATORY_CARE_PROVIDER_SITE_OTHER): Payer: Managed Care, Other (non HMO) | Admitting: Internal Medicine

## 2016-05-06 VITALS — BP 102/74 | HR 105 | Ht 74.0 in | Wt 206.0 lb

## 2016-05-06 DIAGNOSIS — I5022 Chronic systolic (congestive) heart failure: Secondary | ICD-10-CM

## 2016-05-06 DIAGNOSIS — I471 Supraventricular tachycardia: Secondary | ICD-10-CM | POA: Diagnosis not present

## 2016-05-06 DIAGNOSIS — I429 Cardiomyopathy, unspecified: Secondary | ICD-10-CM | POA: Diagnosis not present

## 2016-05-06 DIAGNOSIS — I472 Ventricular tachycardia: Secondary | ICD-10-CM

## 2016-05-06 DIAGNOSIS — I4729 Other ventricular tachycardia: Secondary | ICD-10-CM

## 2016-05-06 DIAGNOSIS — I428 Other cardiomyopathies: Secondary | ICD-10-CM

## 2016-05-06 DIAGNOSIS — Z95810 Presence of automatic (implantable) cardiac defibrillator: Secondary | ICD-10-CM

## 2016-05-06 NOTE — Progress Notes (Signed)
Patient Care Team: Haywood Pao, MD as PCP - General (Internal Medicine)   HPI  Glenn Gonzalez is a 55 y.o. 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 He is s/p CRT-D  He is followed by the heart failure clinic  In the transplant clinic at [facility]. He recently underwent a right heart cath.   he was started on milrinone. He is now being followed at the transplant center at [facility]. He has had significant increase in heart rates and loss of biventricular pacing   He notes a significant increase in his heart rate  With minimal exertion  He has not had any intercurrent defibrillator discharges     Past Medical History  Diagnosis Date  . CHF (congestive heart failure) ([place])   . A-fib ([place])   . GERD (gastroesophageal reflux disease)   . Pacemaker 2011    [place] device/2011  . Hypothyroidism   . Depression   . Pneumonia     recurrent pneumonia sedf.  rad tx for lymphoma  . Asthma     pt. denies  . Hodgkin's lymphoma ([place]) 1980    IIIB. 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  Marland Kitchen History of radiation therapy   . History of kidney stones   . Shortness of breath dyspnea     with exertion    Past Surgical History  Procedure Laterality Date  . Neck biopses      x5  . Exploratory laparotomy    . Permanent pacemaker  2011    [employer] COGNIS device  . Gynemastia Bilateral   . Portacath placement      06-29-15 inserted, now removed.  . Tonsillectomy    . Colonoscopy with propofol N/A 08/03/2015    Procedure: COLONOSCOPY WITH PROPOFOL;  Surgeon: Carol Ada, MD;  Location: [facility] END[facility];  Service: Endoscopy;  Laterality: N/A;  wants to try to do procedure without sedation  . Cystoscopy with retrograde pyelogram, ureteroscopy and stent placement Right 10/12/2015    Procedure:  [place], URETEROSCOPY AND STENT PLACEMENT;  Surgeon: Cleon Gustin, MD;  Location: [facility] OR[facility];  Service: Urology;  Laterality: Right;  . Stone extraction with basket Right 10/12/2015    Procedure: STONE EXTRACTION WITH BASKET;  Surgeon: Cleon Gustin, MD;  Location: [facility] OR[facility];  Service: Urology;  Laterality: Right;  . Cardiac catheterization N/A 04/20/2015    Procedure: Right/Left Heart Cath and Coronary Angiography;  Surgeon: Jolaine Artist, MD;  Location: [place] [facility] LAB;  Service: Cardiovascular;  Laterality: N/A;  . Cardiac catheterization  10-11-15    pt states routine heart cath to be done at [place]   . Esophagogastroduodenoscopy    . Lumbar laminectomy/decompression microdiscectomy Right 01/04/2016    Procedure: Microdiscectomy Lumbar four Lumbar five right;  Surgeon: Eustace Moore, MD;  Location: [place] [facility] ORS;  Service: Neurosurgery;  Laterality: Right;  . Cardiac catheterization N/A 04/02/2016    Procedure: Right Heart Cath;  Surgeon: Jolaine Artist, MD;  Location: [place] CV LAB;  Service: Cardiovascular;  Laterality: N/A;    Current Outpatient Prescriptions  Medication Sig Dispense Refill  . acetaminophen (TYLENOL) 650 MG CR tablet Take 1,300 mg by mouth at bedtime.     . carvedilol (COREG) 12.5 MG tablet Take 1 tablet (12.5 mg total) by mouth 2 (two)  times daily with a meal. 180 tablet 3  . cetirizine (ZYRTEC) 10 MG tablet Take 10 mg by mouth at bedtime.     . furosemide (LASIX) 40 MG tablet Take 40 mg by mouth daily as needed for fluid.     Marland Kitchen levothyroxine (SYNTHROID, LEVOTHROID) 100 MCG tablet Take 100 mcg by mouth daily before breakfast.     . milrinone (PRIMACOR) 20 MG/100 ML SOLN infusion Inject into the vein. Use as directed    . Probiotic Product (RESTORA PO) Take 1 tablet by mouth daily. Reported on 04/30/2016    . RABEprazole (ACIPHEX) 20 MG tablet Take 1 tablet (20 mg total) by mouth 2 (two) times daily. 180 tablet 3  .  sacubitril-valsartan (ENTRESTO) 24-26 MG Take 1 tablet by mouth 2 (two) times daily. Reported on 04/30/2016     No current facility-administered medications for this visit.    Allergies  Allergen Reactions  . Digoxin Other (See Comments)    gynecomastia   . Phencyclidine Other (See Comments)    PCP derived antibiotic > Insomnia  . Spironolactone Other (See Comments)    Gynecomastia  . Lactose Intolerance (Gi) Diarrhea    cramps  . Milk-Related Compounds Other (See Comments)    Cramps and diarrhea Lactose intolerance   . Penicillins     Cramps Has patient had a PCN reaction causing immediate rash, facial/tongue/throat swelling, SOB or lightheadedness with hypotension: No Has patient had a PCN reaction causing severe rash involving mucus membranes or skin necrosis: No Has patient had a PCN reaction that required hospitalization No Has patient had a PCN reaction occurring within the last 10 years: No If all of the above answers are "NO", then may proceed with Cephalosporin use.      Review of Systems negative except from HPI and PMH  Physical Exam BP 102/74 mmHg  Pulse 105  Ht 6\' 2"  (1.88 m)  Wt 206 lb (93.441 kg)  BMI 26.44 kg/m2 Well developed and well nourished in no acute distress  HENT normal Neck supple with JVP-flat Clear Regular rate and rhythm, no murmurs or gallops Abd-soft with active BS No Clubbing cyanosis edema Skin-warm and dry A & Oriented  Grossly normal sensory and motor function  psynchronous pacing with a biventricular pacing pattern  Assessment and  Plan Nonischemic cardio myopathy  Congestive heart failure  chronic-systolic  Ventricular tachycardia   Atrial fibrillation  Implantable defibrillator-[employer]  The patient's device was interrogated and the information was fully reviewed.  The device was not reprogrammed Atrial tachycardia   He was having ventricular oversensing inappropriate mode switch and loss of IV pacing. With   Decreased his atrial sensitivity from 0.25-1.0 , this was eliminated 100% pacing was noted. We also shortened at the sensed AV delay from 160--140 ms. We have increased his mode switch rate from 1:30--160 so as to allow for biventricular pacing at higher heart rates.   I wonder whether it be a candidate for  Ivabradine  Or down titration of his milrinone   with his atrial fibrillation he should also be on a NOAC   I will discuss this with his doctors at [facility]

## 2016-05-06 NOTE — Patient Instructions (Signed)
Your physician recommends that you continue on your current medications as directed. Please refer to the Current Medication list given to you today.  Your physician wants you to follow-up in: 6 months with the device clinic.  You will receive a reminder letter in the mail two months in advance. If you don't receive a letter, please call our office to schedule the follow-up appointment.  Your physician wants you to follow-up in: 1 year with Dr. [name].  You will receive a reminder letter in the mail two months in advance. If you don't receive a letter, please call our office to schedule the follow-up appointment.

## 2016-05-12 LAB — CUP PACEART INCLINIC DEVICE CHECK
HighPow Impedance: 54 Ohm
Implantable Lead Implant Date: 20030925
Implantable Lead Implant Date: 20030925
Implantable Lead Location: 753859
Implantable Lead Location: 753860
Implantable Lead Model: 158
Implantable Lead Model: 4513
Implantable Lead Serial Number: 122241
Implantable Lead Serial Number: 309118
Lead Channel Impedance Value: 474 Ohm
Lead Channel Impedance Value: 555 Ohm
Lead Channel Pacing Threshold Amplitude: 1 V
Lead Channel Pacing Threshold Pulse Width: 0.4 ms
Lead Channel Sensing Intrinsic Amplitude: 20.3 mV
Lead Channel Sensing Intrinsic Amplitude: 9.6 mV
Lead Channel Setting Pacing Amplitude: 1.2 V
Lead Channel Setting Sensing Sensitivity: 0.6 mV
Lead Channel Setting Sensing Sensitivity: 1 mV
MDC IDC LEAD IMPLANT DT: 20030925
MDC IDC LEAD LOCATION: 753858
MDC IDC MSMT LEADCHNL LV PACING THRESHOLD PULSEWIDTH: 0.8 ms
MDC IDC MSMT LEADCHNL RA IMPEDANCE VALUE: 443 Ohm
MDC IDC MSMT LEADCHNL RA PACING THRESHOLD AMPLITUDE: 0.7 V
MDC IDC MSMT LEADCHNL RA PACING THRESHOLD PULSEWIDTH: 0.4 ms
MDC IDC MSMT LEADCHNL RA SENSING INTR AMPL: 1.2 mV
MDC IDC MSMT LEADCHNL RV PACING THRESHOLD AMPLITUDE: 1 V
MDC IDC SESS DTM: 20170606040000
MDC IDC SET LEADCHNL LV PACING PULSEWIDTH: 0.8 ms
MDC IDC SET LEADCHNL RA PACING AMPLITUDE: 2 V
MDC IDC SET LEADCHNL RV PACING AMPLITUDE: 2.1 V
MDC IDC SET LEADCHNL RV PACING PULSEWIDTH: 0.4 ms
Pulse Gen Serial Number: 588967

## 2016-05-13 ENCOUNTER — Encounter: Payer: Self-pay | Admitting: Podiatry

## 2016-05-14 ENCOUNTER — Inpatient Hospital Stay (HOSPITAL_COMMUNITY)
Admission: RE | Admit: 2016-05-14 | Payer: Managed Care, Other (non HMO) | Source: Ambulatory Visit | Admitting: Internal Medicine

## 2016-05-15 ENCOUNTER — Ambulatory Visit (INDEPENDENT_AMBULATORY_CARE_PROVIDER_SITE_OTHER): Payer: Managed Care, Other (non HMO) | Admitting: *Deleted

## 2016-05-15 DIAGNOSIS — Z7901 Long term (current) use of anticoagulants: Secondary | ICD-10-CM | POA: Diagnosis not present

## 2016-05-15 DIAGNOSIS — I5022 Chronic systolic (congestive) heart failure: Secondary | ICD-10-CM

## 2016-05-15 DIAGNOSIS — I4891 Unspecified atrial fibrillation: Secondary | ICD-10-CM | POA: Insufficient documentation

## 2016-05-15 LAB — POCT INR: INR: 1.4

## 2016-05-15 NOTE — Patient Instructions (Signed)

## 2016-05-22 ENCOUNTER — Ambulatory Visit (INDEPENDENT_AMBULATORY_CARE_PROVIDER_SITE_OTHER): Payer: Managed Care, Other (non HMO) | Admitting: *Deleted

## 2016-05-22 ENCOUNTER — Telehealth (HOSPITAL_COMMUNITY): Payer: Self-pay

## 2016-05-22 DIAGNOSIS — Z7901 Long term (current) use of anticoagulants: Secondary | ICD-10-CM

## 2016-05-22 DIAGNOSIS — I5022 Chronic systolic (congestive) heart failure: Secondary | ICD-10-CM

## 2016-05-22 LAB — POCT INR: INR: 2

## 2016-05-22 NOTE — Telephone Encounter (Signed)
Patient left VM on CHF clinic triage line c/o milrinone issues (did not specify in message). Attempted to return call, no answer. Seems in reading through recent OV note of Dr. [name] that he is having faster HR and wishes to down titrate milrinone. Will continue to try to reach patient, has upcomming apt with Dr. Haroldine Laws 05/29/16.  Renee Pain

## 2016-06-17 NOTE — Progress Notes (Signed)
CRT-D device check in office. Thresholds and sensing consistent with previous device measurements. Lead impedance trends stable over time. 45 mode switch episodes recorded (1%), AF/AFl +no OAC, longest <24hr, rare episodes suggest FFRW. 93 ventricular arrhythmia episodes recorded, 2 monitored VT-1 episodes--AF/AFl, rates 148-173bpm. Patient bi-ventricularly pacing 69% of the time, decreased from 98% at last check on 01/25/16 (started on milrinone since then). Device programmed with appropriate safety margins. No changes made this session. Estimated longevity 4.5 years. Patient education completed including shock plan. ROV with SK on 05/06/16 to address [place] and decreased BiVp %.

## 2016-06-20 HISTORY — PX: HEART TRANSPLANT: SHX268

## 2016-08-19 ENCOUNTER — Telehealth (HOSPITAL_COMMUNITY): Payer: Self-pay | Admitting: *Deleted

## 2016-08-27 ENCOUNTER — Other Ambulatory Visit (HOSPITAL_COMMUNITY): Payer: Self-pay

## 2016-08-28 ENCOUNTER — Other Ambulatory Visit (HOSPITAL_COMMUNITY)
Admission: RE | Admit: 2016-08-28 | Discharge: 2016-08-28 | Disposition: A | Discharge status: Home / Self Care | Payer: Managed Care, Other (non HMO) | Source: Ambulatory Visit | Attending: Cardiology | Admitting: Cardiology

## 2016-08-28 DIAGNOSIS — Z029 Encounter for administrative examinations, unspecified: Secondary | ICD-10-CM | POA: Diagnosis present

## 2016-08-28 LAB — BASIC METABOLIC PANEL
ANION GAP: 11 (ref 5–15)
BUN: 26 mg/dL — ABNORMAL HIGH (ref 6–20)
CALCIUM: 9.4 mg/dL (ref 8.9–10.3)
CO2: 26 mmol/L (ref 22–32)
CREATININE: 1.61 mg/dL — AB (ref 0.61–1.24)
Chloride: 103 mmol/L (ref 101–111)
GFR, EST AFRICAN AMERICAN: 54 mL/min — AB (ref >60)
GFR, EST NON AFRICAN AMERICAN: 47 mL/min — AB (ref >60)
Glucose, Bld: 96 mg/dL (ref 65–99)
Potassium: 3.9 mmol/L (ref 3.5–5.1)
Sodium: 140 mmol/L (ref 135–145)

## 2016-08-28 LAB — HIV ANTIBODY (ROUTINE TESTING W REFLEX): HIV SCREEN 4TH GENERATION: NONREACTIVE

## 2016-08-29 LAB — HCV RNA QUANT: HCV QUANT: NOT DETECTED [IU]/mL (ref >50)

## 2016-08-29 LAB — HEPATITIS B CORE ANTIBODY, TOTAL: HEP B C TOTAL AB: NEGATIVE

## 2016-08-29 LAB — HEPATITIS B SURFACE ANTIBODY, QUANTITATIVE: HEPATITIS B-POST: 7.1 m[IU]/mL — AB

## 2016-08-29 LAB — HEPATITIS B SURFACE ANTIGEN: Hepatitis B Surface Ag: NEGATIVE

## 2016-08-29 LAB — TACROLIMUS LEVEL: TACROLIMUS (FK506) - LABCORP: 10.9 ng/mL (ref 2.0–20.0)

## 2016-09-02 IMAGING — DX DG CHEST 2V
2 series · 2 of 2 positions shown · non-contrast
Comparison: 06/10/2010

CLINICAL DATA: Right kidney pain, shortness of Breath

EXAM:
CHEST  2 VIEW

[chest pa]
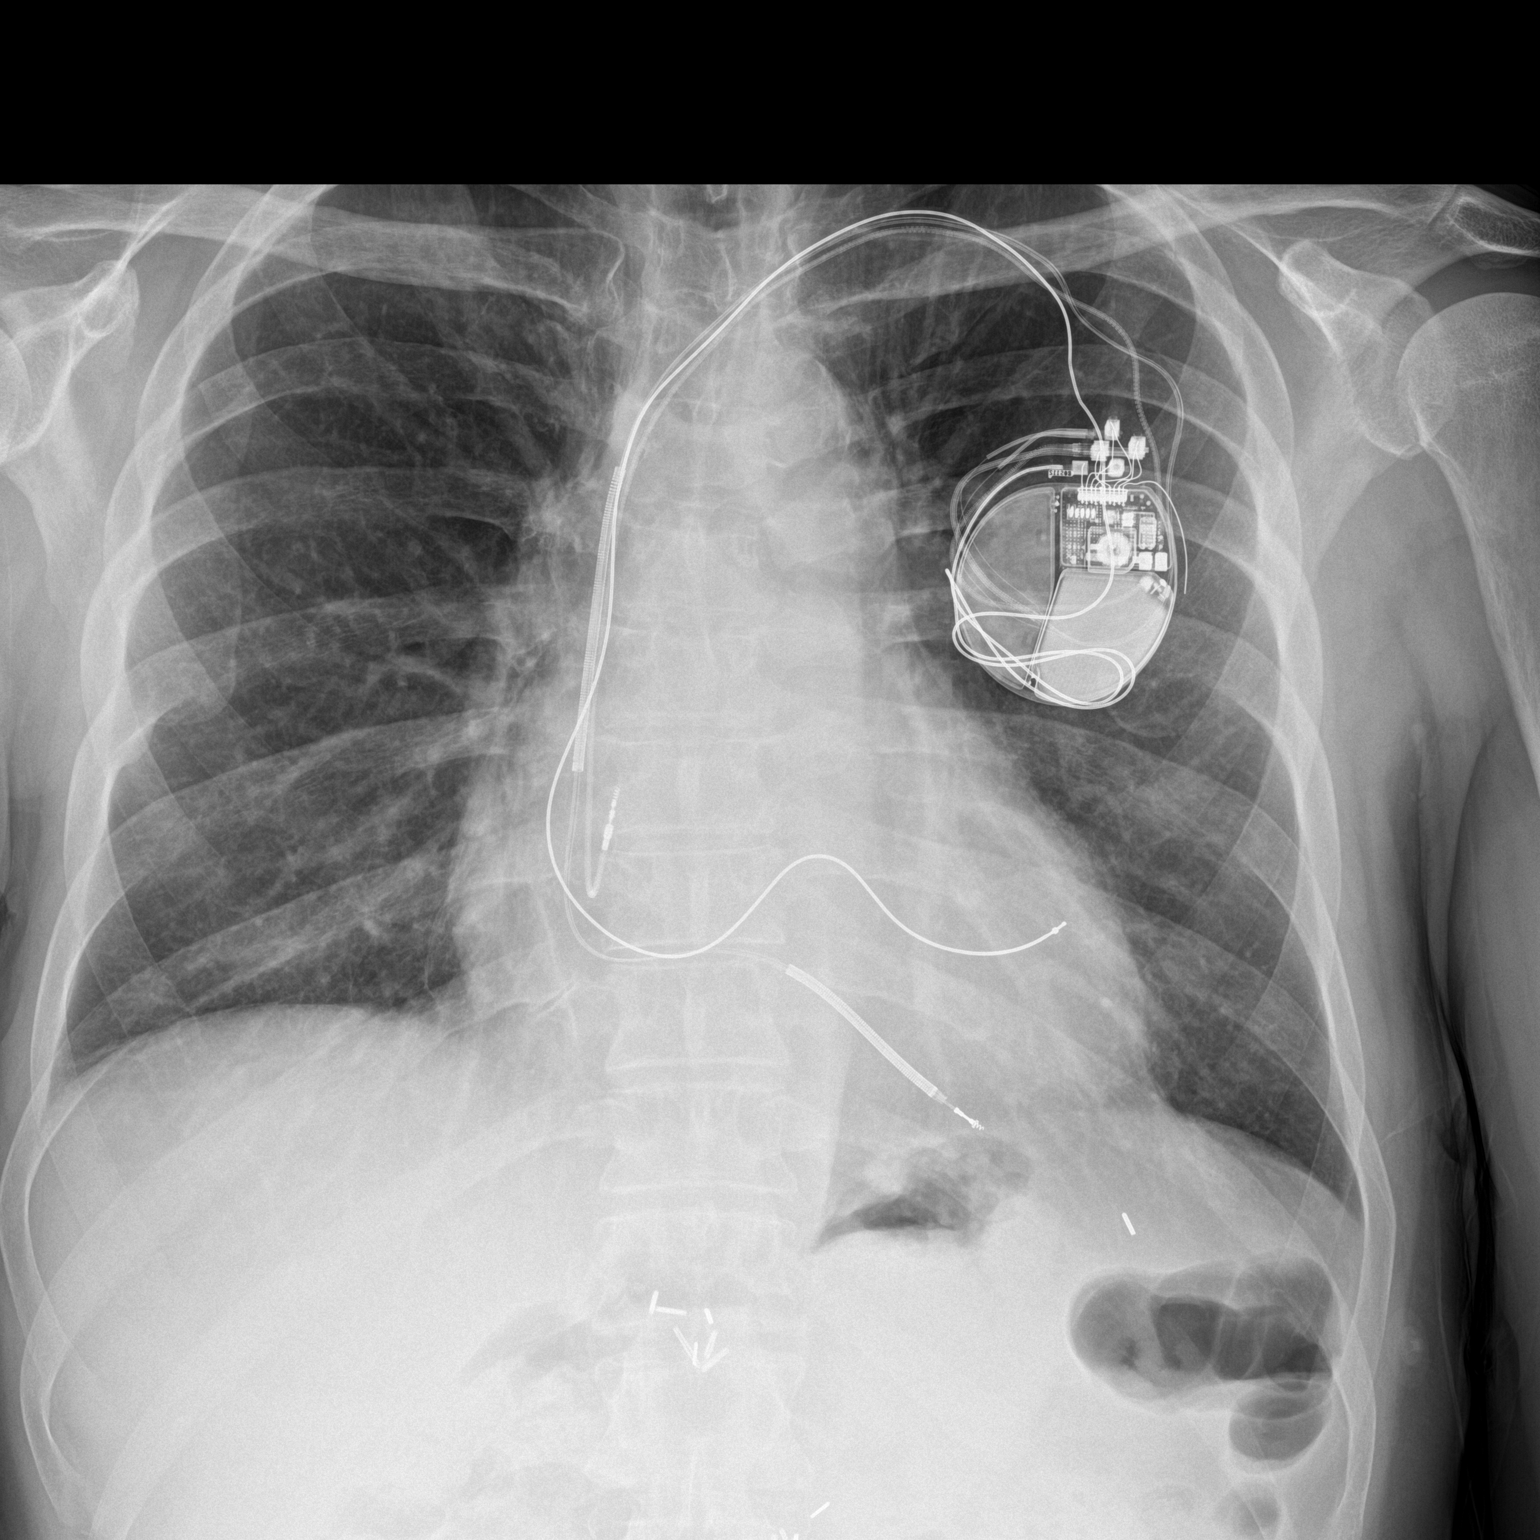

[chest lat]
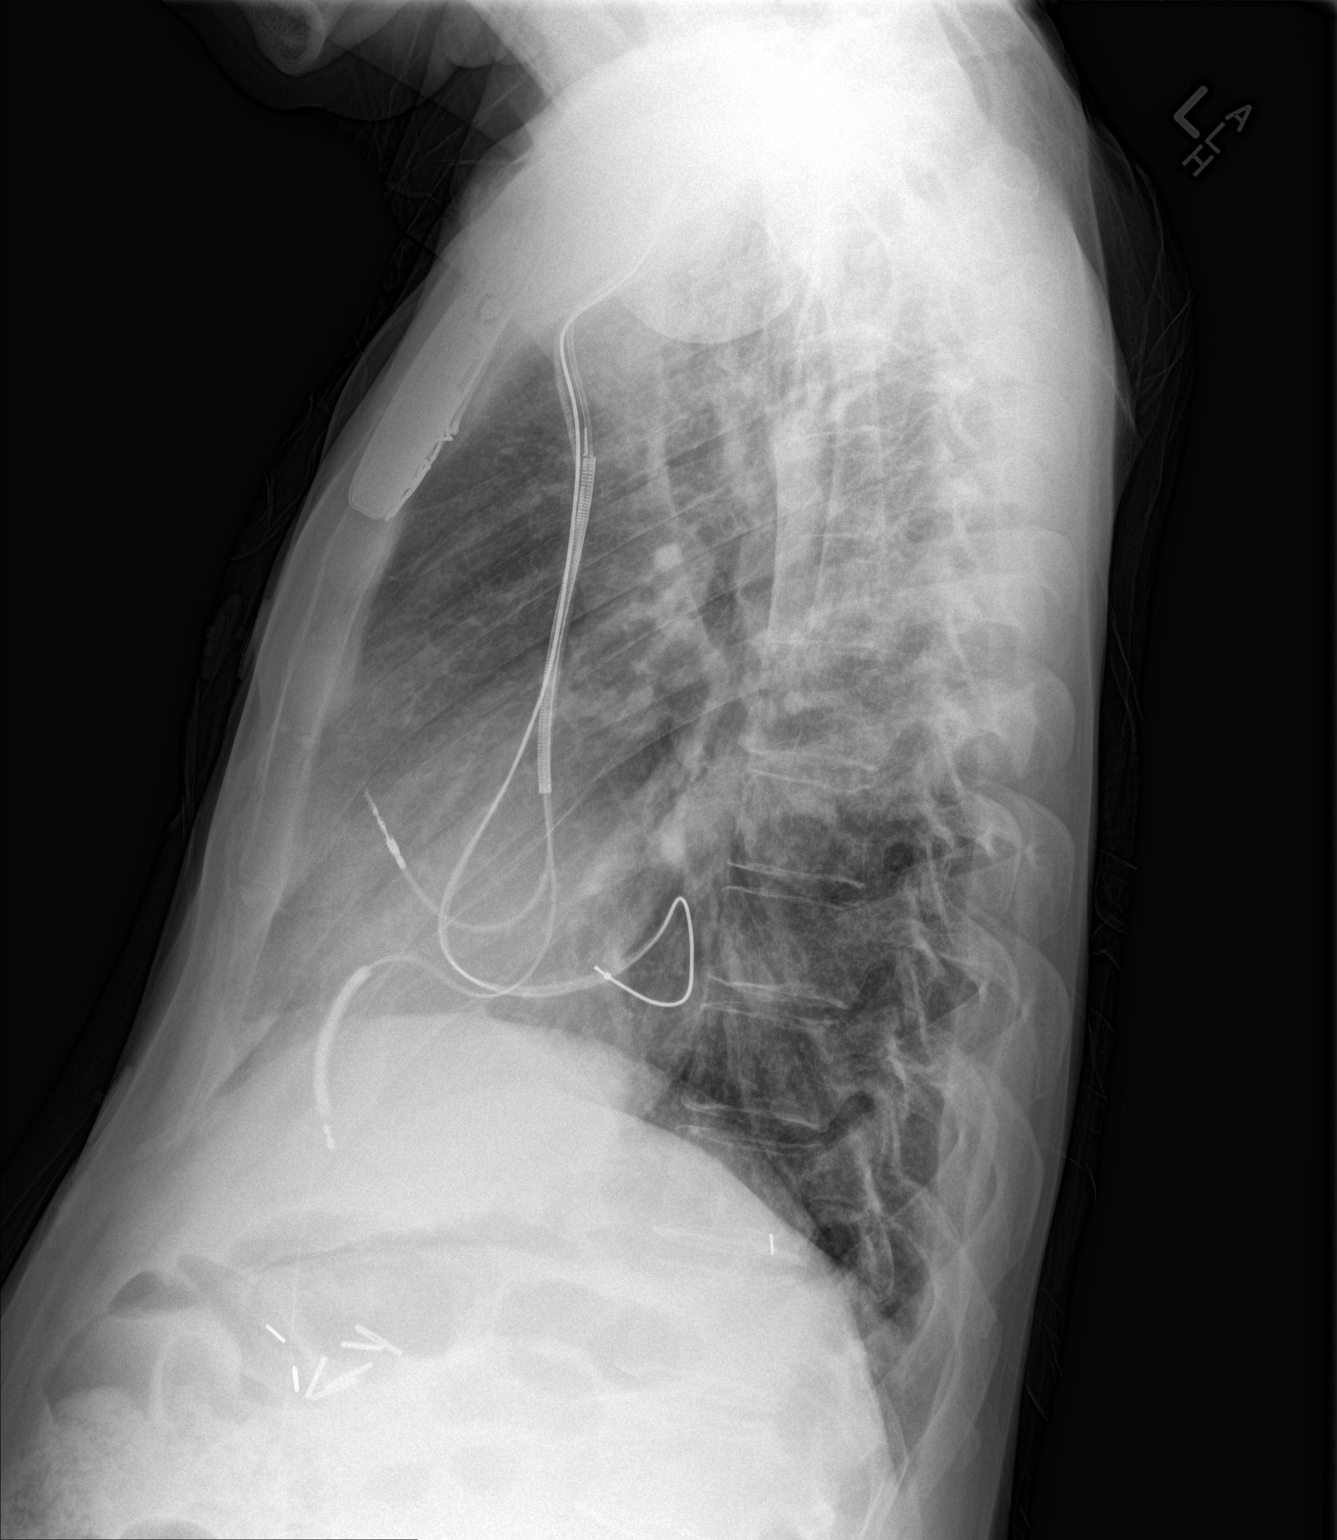

[2 of 2 positions shown; findings below may reference images not displayed]

FINDINGS: Left AICD remains in place, unchanged. Heart is borderline in size.
No confluent airspace opacities, effusions or edema. No acute bony
abnormality.
IMPRESSION: No active cardiopulmonary disease.

## 2016-09-04 ENCOUNTER — Encounter (HOSPITAL_COMMUNITY)
Admission: RE | Admit: 2016-09-04 | Discharge: 2016-09-04 | Disposition: A | Discharge status: Home / Self Care | Payer: Managed Care, Other (non HMO) | Source: Ambulatory Visit | Attending: Cardiology | Admitting: Cardiology

## 2016-09-04 ENCOUNTER — Encounter (HOSPITAL_COMMUNITY): Payer: Self-pay

## 2016-09-04 VITALS — BP 108/72 | HR 102 | Ht 74.0 in | Wt 197.3 lb

## 2016-09-04 DIAGNOSIS — Z941 Heart transplant status: Secondary | ICD-10-CM | POA: Insufficient documentation

## 2016-09-04 NOTE — Progress Notes (Signed)
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  acetaminophen (TYLENOL) 650 MG CR tablet, Take 1,300 mg by mouth at bedtime. , Disp: , Rfl:  .  aspirin EC 81 MG tablet, Take 81 mg by mouth daily., Disp: , Rfl:  .  calcium carbonate (OSCAL) 1500 (600 Ca) MG TABS tablet, Take 1,500 mg by mouth 2 (two) times daily with a meal. Separated from transplant medications., Disp: , Rfl:  .  cetirizine (ZYRTEC) 10 MG tablet, Take 10 mg by mouth at bedtime. , Disp: , Rfl:  .  cholecalciferol (VITAMIN D) 400 units TABS tablet, Take 400 Units by mouth 2 (two) times daily. Takes with calcium (has combination tablet., Disp: , Rfl:  .  furosemide (LASIX) 40 MG tablet, Take 40 mg by mouth daily as needed for fluid. , Disp: , Rfl:  .  levothyroxine (SYNTHROID, LEVOTHROID) 100 MCG tablet, Take 100 mcg by mouth daily before breakfast. , Disp: , Rfl:  .  magnesium oxide (MAG-OX) 400 MG tablet, Take 400 mg by mouth 2 (two) times daily. Separates from transplant meds. Takes with Ca/Vit D., Disp: , Rfl:  .  mycophenolate (CELLCEPT) 500 MG tablet, Take 1,000 mg by mouth every 12 (twelve) hours., Disp: , Rfl:  .  nystatin (MYCOSTATIN) 100000 UNIT/ML suspension, Take 5 mLs by mouth 4 (four) times daily. Swish and swallow after meals and bedtime. Do not eat or drink for 15 minutes., Disp: , Rfl:  .  pravastatin (PRAVACHOL) 20 MG tablet, Take 20  mg by mouth at bedtime., Disp: , Rfl:  .  predniSONE (DELTASONE) 10 MG tablet, Take 12.5 mg by mouth daily with breakfast., Disp: , Rfl:  .  RABEprazole (ACIPHEX) 20 MG tablet, Take 1 tablet (20 mg total) by mouth 2 (two) times daily., Disp: 180 tablet, Rfl: 3 .  sennosides-docusate sodium (SENOKOT-S) 8.6-50 MG tablet, Take 2 tablets by mouth 2 (two) times daily., Disp: , Rfl:  .  sulfamethoxazole-trimethoprim (BACTRIM,SEPTRA) 400-80 MG tablet, Take 1 tablet by mouth daily., Disp: , Rfl:  .  tacrolimus (PROGRAF) 1 MG capsule, Take 4 mg by mouth every 12 (twelve) hours., Disp: , Rfl:  .  valACYclovir (VALTREX) 500 MG tablet, Take 500 mg by mouth every 12 (twelve) hours., Disp: , Rfl:   Past Medical History: Past Medical History:  Diagnosis Date  . A-fib ([place])   . Asthma    pt. denies  . CHF (congestive heart failure) ([place])   . Depression   . GERD (gastroesophageal reflux disease)   . History of kidney stones   . History of radiation therapy   . Hodgkin's lymphoma ([place]) 1980   IIIB. 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  . Hypothyroidism   . Pacemaker 2011   [place] device/2011  . Pneumonia    recurrent pneumonia sedf.  rad tx for lymphoma  . Shortness of breath dyspnea  with exertion    Tobacco Use: History  Smoking Status  . Never Smoker  Smokeless Tobacco  . Never Used    Labs: Recent Review Flowsheet Data    Labs for ITP Cardiac and Pulmonary Rehab Latest Ref Rng & Units 05/12/2008 04/20/2015 04/20/2015 04/02/2016 04/02/2016   Cholestrol 0 - 200 mg/dL 174 - - - -   LDLCALC 0 - 99 mg/dL 131(H) - - - -   LDLDIRECT mg/dL - - - - -   HDL >39.0 mg/dL 31.1(L) - - - -   Trlycerides 0 - 149 mg/dL 60 - - - -   HCO3 20.0 - 24.0 mEq/L - 25.8(H) 25.7(H) 25.3(H) 25.1(H)   TCO2 0 - 100 mmol/L - 27 27 27 26    ACIDBASEDEF 0.0 - 2.0 mmol/L - - - 1.0 1.0   O2SAT % - 60.0 63.0 53.0 52.0      Capillary Blood Glucose: No results found for:  GLUCAP   Exercise Target Goals: Date: 09/04/16  Exercise Program Goal: Individual exercise prescription set with THRR, safety & activity barriers. Participant demonstrates ability to understand and report RPE using BORG scale, to self-measure pulse accurately, and to acknowledge the importance of the exercise prescription.  Exercise Prescription Goal: Starting with aerobic activity 30 plus minutes a day, 3 days per week for initial exercise prescription. Provide home exercise prescription and guidelines that participant acknowledges understanding prior to discharge.  Activity Barriers & Risk Stratification:     Activity Barriers & Cardiac Risk Stratification - 09/04/16 1639      Activity Barriers & Cardiac Risk Stratification   Activity Barriers Muscular Weakness      6 Minute Walk:     6 Minute Walk    Row Name 09/04/16 1428 09/04/16 1434       6 Minute Walk   Phase Initial Initial    Distance  - 1729 feet    Walk Time  - 6 minutes    # of Rest Breaks  - 0    MPH  - 3.3    METS  - 4.9    RPE  - 11    VO2 Peak  - 17.2    Symptoms  - No    Resting HR  - 102 bpm    Resting BP  - 108/72    Max Ex. HR  - 120 bpm    Max Ex. BP  - 146/70    2 Minute Post BP  - 96/62       Initial Exercise Prescription:     Initial Exercise Prescription - 09/04/16 1400      Date of Initial Exercise RX and Referring Provider   Date 09/04/16   Referring Provider Fransico Him MD     Bike   Level 1   Minutes 10   METs 2.68     NuStep   Level 3   Minutes 10   METs 2     Track   Laps 10   Minutes 10   METs 2.74     Prescription Details   Frequency (times per week) 3   Duration Progress to 30 minutes of continuous aerobic without signs/symptoms of physical distress     Intensity   THRR 40-80% of Max Heartrate 66-133   Ratings of Perceived Exertion 11-13   Perceived Dyspnea 0-4     Progression   Progression Continue to progress workloads to maintain intensity without  signs/symptoms of physical distress.     Resistance Training  Training Prescription Yes   Weight 2lbs   Reps 10-12      Perform Capillary Blood Glucose checks as needed.  Exercise Prescription Changes:   Exercise Comments:   Discharge Exercise Prescription (Final Exercise Prescription Changes):   Nutrition:  Target Goals: Understanding of nutrition guidelines, daily intake of sodium 1500mg , cholesterol 200mg , calories 30% from fat and 7% or less from saturated fats, daily to have 5 or more servings of fruits and vegetables.  Biometrics:     Pre Biometrics - 09/04/16 1444      Pre Biometrics   Waist Circumference 37 inches   Hip Circumference 43.5 inches   Waist to Hip Ratio 0.85 %   Triceps Skinfold 23 mm   % Body Fat 26 %   Grip Strength 37 kg   Flexibility 8.5 in   Single Leg Stand 22.76 seconds       Nutrition Therapy Plan and Nutrition Goals:     Nutrition Therapy & Goals - 09/04/16 1048      Nutrition Therapy   Diet Therapeutic Lifestyle Changes     Intervention Plan   Intervention Prescribe, educate and counsel regarding individualized specific dietary modifications aiming towards targeted core components such as weight, hypertension, lipid management, diabetes, heart failure and other comorbidities.   Expected Outcomes Short Term Goal: Understand basic principles of dietary content, such as calories, fat, sodium, cholesterol and nutrients.;Long Term Goal: Adherence to prescribed nutrition plan.      Nutrition Discharge: Nutrition Scores:     Nutrition Assessments - 09/04/16 1048      MEDFICTS Scores   Pre Score 7      Nutrition Goals Re-Evaluation:   Psychosocial: Target Goals: Acknowledge presence or absence of depression, maximize coping skills, provide positive support system. Participant is able to verbalize types and ability to use techniques and skills needed for reducing stress and depression.  Initial Review & Psychosocial  Screening:     Initial Psych Review & Screening - 09/04/16 [place]? Yes     Screening Interventions   Interventions Encouraged to exercise      Quality of Life Scores:   PHQ-9: Recent Review Flowsheet Data    There is no flowsheet data to display.      Psychosocial Evaluation and Intervention:   Psychosocial Re-Evaluation:   Vocational Rehabilitation: Provide vocational rehab assistance to qualifying candidates.   Vocational Rehab Evaluation & Intervention:     Vocational Rehab - 09/04/16 1639      Initial Vocational Rehab Evaluation & Intervention   Assessment shows need for Vocational Rehabilitation No  Pt does not plan to return to work.      Education: Education Goals: Education classes will be provided on a weekly basis, covering required topics. Participant will state understanding/return demonstration of topics presented.  Learning Barriers/Preferences:     Learning Barriers/Preferences - 09/04/16 0900      Learning Barriers/Preferences   Learning Barriers Sight   Learning Preferences None      Education Topics: Count Your Pulse:  -Group instruction provided by verbal instruction, demonstration, patient participation and written materials to support subject.  Instructors address importance of being able to find your pulse and how to count your pulse when at home without a heart monitor.  Patients get hands on experience counting their pulse with staff help and individually.   Heart Attack, Angina, and Risk Factor Modification:  -Group instruction provided by verbal instruction, video, and  written materials to support subject.  Instructors address signs and symptoms of angina and heart attacks.    Also discuss risk factors for heart disease and how to make changes to improve heart health risk factors.   Functional Fitness:  -Group instruction provided by verbal instruction, demonstration, patient  participation, and written materials to support subject.  Instructors address safety measures for doing things around the house.  Discuss how to get up and down off the floor, how to pick things up properly, how to safely get out of a chair without assistance, and balance training.   Meditation and Mindfulness:  -Group instruction provided by verbal instruction, patient participation, and written materials to support subject.  Instructor addresses importance of mindfulness and meditation practice to help reduce stress and improve awareness.  Instructor also leads participants through a meditation exercise.    Stretching for Flexibility and Mobility:  -Group instruction provided by verbal instruction, patient participation, and written materials to support subject.  Instructors lead participants through series of stretches that are designed to increase flexibility thus improving mobility.  These stretches are additional exercise for major muscle groups that are typically performed during regular warm up and cool down.   Hands Only CPR Anytime:  -Group instruction provided by verbal instruction, video, patient participation and written materials to support subject.  Instructors co-teach with AHA video for hands only CPR.  Participants get hands on experience with mannequins.   Nutrition I class: Heart Healthy Eating:  -Group instruction provided by PowerPoint slides, verbal discussion, and written materials to support subject matter. The instructor gives an explanation and review of the Therapeutic Lifestyle Changes diet recommendations, which includes a discussion on lipid goals, dietary fat, sodium, fiber, plant stanol/sterol esters, sugar, and the components of a well-balanced, healthy diet.   Nutrition II class: Lifestyle Skills:  -Group instruction provided by PowerPoint slides, verbal discussion, and written materials to support subject matter. The instructor gives an explanation and review  of label reading, grocery shopping for heart health, heart healthy recipe modifications, and ways to make healthier choices when eating out.   Diabetes Question & Answer:  -Group instruction provided by PowerPoint slides, verbal discussion, and written materials to support subject matter. The instructor gives an explanation and review of diabetes co-morbidities, pre- and post-prandial blood glucose goals, pre-exercise blood glucose goals, signs, symptoms, and treatment of hypoglycemia and hyperglycemia, and foot care basics.   Diabetes Blitz:  -Group instruction provided by PowerPoint slides, verbal discussion, and written materials to support subject matter. The instructor gives an explanation and review of the physiology behind type 1 and type 2 diabetes, diabetes medications and rational behind using different medications, pre- and post-prandial blood glucose recommendations and Hemoglobin A1c goals, diabetes diet, and exercise including blood glucose guidelines for exercising safely.    Portion Distortion:  -Group instruction provided by PowerPoint slides, verbal discussion, written materials, and food models to support subject matter. The instructor gives an explanation of serving size versus portion size, changes in portions sizes over the last 20 years, and what consists of a serving from each food group.   Stress Management:  -Group instruction provided by verbal instruction, video, and written materials to support subject matter.  Instructors review role of stress in heart disease and how to cope with stress positively.     Exercising on Your Own:  -Group instruction provided by verbal instruction, power point, and written materials to support subject.  Instructors discuss benefits of exercise, components of exercise, frequency and  intensity of exercise, and end points for exercise.  Also discuss use of nitroglycerin and activating EMS.  Review options of places to exercise outside of  rehab.  Review guidelines for sex with heart disease.   Cardiac Drugs I:  -Group instruction provided by verbal instruction and written materials to support subject.  Instructor reviews cardiac drug classes: antiplatelets, anticoagulants, beta blockers, and statins.  Instructor discusses reasons, side effects, and lifestyle considerations for each drug class.   Cardiac Drugs II:  -Group instruction provided by verbal instruction and written materials to support subject.  Instructor reviews cardiac drug classes: angiotensin converting enzyme inhibitors (ACE-I), angiotensin II receptor blockers (ARBs), nitrates, and calcium channel blockers.  Instructor discusses reasons, side effects, and lifestyle considerations for each drug class.   Anatomy and Physiology of the Circulatory System:  -Group instruction provided by verbal instruction, video, and written materials to support subject.  Reviews functional anatomy of heart, how it relates to various diagnoses, and what role the heart plays in the overall system.   Knowledge Questionnaire Score:     Knowledge Questionnaire Score - 09/04/16 1431      Knowledge Questionnaire Score   Pre Score 20/24      Core Components/Risk Factors/Patient Goals at Admission:     Personal Goals and Risk Factors at Admission - 09/04/16 1445      Core Components/Risk Factors/Patient Goals on Admission   Heart Failure Yes   Intervention Provide a combined exercise and nutrition program that is supplemented with education, support and counseling about heart failure. Directed toward relieving symptoms such as shortness of breath, decreased exercise tolerance, and extremity edema.   Expected Outcomes Improve functional capacity of life;Short term: Daily weights obtained and reported for increase. Utilizing diuretic protocols set by physician.;Short term: Attendance in program 2-3 days a week with increased exercise capacity. Reported lower sodium intake. Reported  increased fruit and vegetable intake. Reports medication compliance.;Long term: Adoption of self-care skills and reduction of barriers for early signs and symptoms recognition and intervention leading to self-care maintenance.   Personal Goal "To feel as strong as before heart transplant.  Short: improve fatigue/SOB with climbing 2 or more flight of stairs. Long term: get back to lifting weights and `running    Intervention Provide exercise programming to improve overall cardiovascular strength and endurance   Expected Outcomes Pt will be able to increase strength, aerobic capacity, and reduce symptoms of SOB/fatigue.      Core Components/Risk Factors/Patient Goals Review:    Core Components/Risk Factors/Patient Goals at Discharge (Final Review):    ITP Comments:     ITP Comments    Row Name 09/04/16 0853           ITP Comments Dr. Fransico Him, Medical Director          Comments:  Pt is in today for cardiac rehab orientation s/p heart transplant at [facility] on 06/20/16.  Pt is referred by Dr. Posey Pronto at [facility]  As a part of cardiac rehab orientation, pt completed 6 minute walk test. Pt tolerated well with no complaints.  Monitor showed ST with rare PVC noted. Pt demonstrates positive and healthy coping skills with the support of wife twin children, extended family and friends. Pt is looking forward to working in cardiac rehab to improve fatigue and shortness of breath.  Pt to return on Monday for full day of exercise. Cherre Huger, BSN

## 2016-09-04 NOTE — Progress Notes (Signed)
Cardiac Rehab Medication Review by a Pharmacist  Does the patient  feel that his/her medications are working for him/her?  yes  Has the patient been experiencing any side effects to the medications prescribed?  Yes, nausea, fatigue, memory/brain fog  Does the patient measure his/her own blood pressure or blood glucose at home?  no   Does the patient have any problems obtaining medications due to transportation or finances?   no  Understanding of regimen: excellent Understanding of indications: excellent Potential of compliance: excellent   Pharmacist comments: Glenn Gonzalez is a 55 year old male who presents to cardiac rehab today. He appears to have a very good handle on his extensive medication list. He is on many medications for his heart transplant and the medication history was taken from both the patient and the list that he brought from [place]. He stated that he has had some adverse effects from his new transplant medications, and that he sees providers at [facility] who have been adjusting his doses accordingly.   Glenn Gonzalez, PharmD Acute Care Pharmacy Resident  Pager: [phone] 09/04/2016

## 2016-09-08 ENCOUNTER — Encounter (HOSPITAL_COMMUNITY)
Admission: RE | Admit: 2016-09-08 | Discharge: 2016-09-08 | Disposition: A | Discharge status: Home / Self Care | Payer: Managed Care, Other (non HMO) | Source: Ambulatory Visit | Attending: Cardiology | Admitting: Cardiology

## 2016-09-08 DIAGNOSIS — Z941 Heart transplant status: Secondary | ICD-10-CM | POA: Diagnosis not present

## 2016-09-08 NOTE — Progress Notes (Signed)
Daily Session Note  Patient Details  Name: Glenn Gonzalez MRN: [id number] Date of Birth: September 15, 1961 Referring Provider:   Flowsheet Row CARDIAC REHAB PHASE II ORIENTATION from 09/04/2016 in [place]  Referring Provider  Fransico Him MD      Encounter Date: 09/08/2016  Check In:     Session Check In - 09/08/16 1152      Check-In   Location [facility]   Staff Present Su Hilt, MS, ACSM RCEP, Exercise Physiologist;Carlette Wilber Oliphant, RN, BSN;Amber Fair, MS, ACSM RCEP, Exercise Physiologist;Olinty [place], MS, ACSM CEP, Exercise Physiologist;Joann Rion, RN, BSN   Supervising physician immediately available to respond to emergencies [facility] immediately available   Physician(s) Dr. Waldron Labs   Medication changes reported     No   Fall or balance concerns reported    No   Warm-up and Cool-down Performed as group-led Location manager Performed Yes   VAD Patient? No     Pain Assessment   Currently in Pain? No/denies   Multiple Pain Sites No      Capillary Blood Glucose: No results found for this or any previous visit (from the past 24 hour(s)).   Goals Met:  Exercise tolerated well Personal goals reviewed  Goals Unmet:  BP  Comments:  Pt in today for his first full day of exercise. Pt tolerated well with some minor fatigue due to overall deconditioning. Pt encourage to progress at a pace comfortable for him and take rest breaks prn. VSS with initial pre exercise bp mid 80's.  Pt drank water and bp increased to 90's.  Pt states this is his usual and was asymptomatic. Called and left message with the heart transplant nurse Rosendo Gros for specific parameters for resting bp and exertional bp during exercise. Telemetry-ST, asymptomatic.  Medication list reconciled. Pt denies barriers to medication compliance.  PSYCHOSOCIAL ASSESSMENT:  PHQ-1 Pt exhibits positive coping skills,  Improving hopeful  outlook with supportive family. No psychosocial needs identified at this time, no psychosocial interventions necessary. Pt is thankful to be alive but admits he struggles with the activity level. Pt desires to feel more energetic and increased stamina.   Pt enjoys playing the piano and doing research.  Pt is an avid reader of education material.  Pt desires to improve fatigue, sob with climbing 2 more flights. Long term get as strong as before heart transplant, get back to running and lifting weights Pt oriented to exercise. equipment and routine.    Understanding verbalized. Maurice Small RN, BSN               Dr. Fransico Him is Medical Director for Cardiac Rehab at [facility].

## 2016-09-10 ENCOUNTER — Encounter (HOSPITAL_COMMUNITY)
Admission: RE | Admit: 2016-09-10 | Discharge: 2016-09-10 | Disposition: A | Discharge status: Home / Self Care | Payer: Managed Care, Other (non HMO) | Source: Ambulatory Visit | Attending: Cardiology | Admitting: Cardiology

## 2016-09-10 DIAGNOSIS — Z941 Heart transplant status: Secondary | ICD-10-CM | POA: Diagnosis not present

## 2016-09-11 NOTE — Progress Notes (Signed)
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  acetaminophen (TYLENOL) 650 MG CR tablet, Take 1,300 mg by mouth at bedtime. , Disp: , Rfl:  .  aspirin EC 81 MG tablet, Take 81 mg by mouth daily., Disp: , Rfl:  .  calcium carbonate (OSCAL) 1500 (600 Ca) MG TABS tablet, Take 1,500 mg by mouth 2 (two) times daily with a meal. Separated from transplant medications., Disp: , Rfl:  .  cetirizine (ZYRTEC) 10 MG tablet, Take 10 mg by mouth at bedtime. , Disp: , Rfl:  .  cholecalciferol (VITAMIN D) 400 units TABS tablet, Take 400 Units by mouth 2 (two) times daily. Takes with calcium (has combination tablet., Disp: , Rfl:  .  furosemide (LASIX) 40 MG tablet, Take 40 mg by mouth daily as needed for fluid. , Disp: , Rfl:  .  levothyroxine (SYNTHROID, LEVOTHROID) 100 MCG tablet, Take 100 mcg by mouth daily before breakfast. , Disp: , Rfl:  .  magnesium oxide (MAG-OX) 400 MG tablet, Take 400 mg by mouth 2 (two) times daily. Separates from transplant meds. Takes with Ca/Vit D., Disp: , Rfl:  .  mycophenolate (CELLCEPT) 500 MG tablet, Take 1,000 mg by mouth every 12 (twelve) hours., Disp: , Rfl:  .  nystatin (MYCOSTATIN) 100000 UNIT/ML suspension, Take 5 mLs by mouth 4 (four) times daily. Swish and swallow after meals and bedtime. Do not eat or drink for 15 minutes., Disp: , Rfl:  .  pravastatin (PRAVACHOL) 20 MG tablet, Take 20  mg by mouth at bedtime., Disp: , Rfl:  .  predniSONE (DELTASONE) 10 MG tablet, Take 12.5 mg by mouth daily with breakfast., Disp: , Rfl:  .  RABEprazole (ACIPHEX) 20 MG tablet, Take 1 tablet (20 mg total) by mouth 2 (two) times daily., Disp: 180 tablet, Rfl: 3 .  sennosides-docusate sodium (SENOKOT-S) 8.6-50 MG tablet, Take 2 tablets by mouth 2 (two) times daily., Disp: , Rfl:  .  sulfamethoxazole-trimethoprim (BACTRIM,SEPTRA) 400-80 MG tablet, Take 1 tablet by mouth daily., Disp: , Rfl:  .  tacrolimus (PROGRAF) 1 MG capsule, Take 4 mg by mouth every 12 (twelve) hours., Disp: , Rfl:  .  valACYclovir (VALTREX) 500 MG tablet, Take 500 mg by mouth every 12 (twelve) hours., Disp: , Rfl:   Past Medical History: Past Medical History:  Diagnosis Date  . A-fib ([place])   . Asthma    pt. denies  . CHF (congestive heart failure) ([place])   . Depression   . GERD (gastroesophageal reflux disease)   . History of kidney stones   . History of radiation therapy   . Hodgkin's lymphoma ([place]) 1980   IIIB. 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  . Hypothyroidism   . Pacemaker 2011   [place] device/2011  . Pneumonia    recurrent pneumonia sedf.  rad tx for lymphoma  . Shortness of breath dyspnea  with exertion    Tobacco Use: History  Smoking Status  . Never Smoker  Smokeless Tobacco  . Never Used    Labs: Recent Review Flowsheet Data    Labs for ITP Cardiac and Pulmonary Rehab Latest Ref Rng & Units 05/12/2008 04/20/2015 04/20/2015 04/02/2016 04/02/2016   Cholestrol 0 - 200 mg/dL 174 - - - -   LDLCALC 0 - 99 mg/dL 131(H) - - - -   LDLDIRECT mg/dL - - - - -   HDL >39.0 mg/dL 31.1(L) - - - -   Trlycerides 0 - 149 mg/dL 60 - - - -   HCO3 20.0 - 24.0 mEq/L - 25.8(H) 25.7(H) 25.3(H) 25.1(H)   TCO2 0 - 100 mmol/L - 27 27 27 26    ACIDBASEDEF 0.0 - 2.0 mmol/L - - - 1.0 1.0   O2SAT % - 60.0 63.0 53.0 52.0      Capillary Blood Glucose: No results found for:  GLUCAP   Exercise Target Goals:    Exercise Program Goal: Individual exercise prescription set with THRR, safety & activity barriers. Participant demonstrates ability to understand and report RPE using BORG scale, to self-measure pulse accurately, and to acknowledge the importance of the exercise prescription.  Exercise Prescription Goal: Starting with aerobic activity 30 plus minutes a day, 3 days per week for initial exercise prescription. Provide home exercise prescription and guidelines that participant acknowledges understanding prior to discharge.  Activity Barriers & Risk Stratification:     Activity Barriers & Cardiac Risk Stratification - 09/04/16 1639      Activity Barriers & Cardiac Risk Stratification   Activity Barriers Muscular Weakness      6 Minute Walk:     6 Minute Walk    Row Name 09/04/16 1428 09/04/16 1434 09/04/16 1650     6 Minute Walk   Phase Initial Initial Initial   Distance  - 1729 feet  -   Walk Time  - 6 minutes  -   # of Rest Breaks  - 0  -   MPH  - 3.3  -   METS  - 4.9  -   RPE  - 11  -   VO2 Peak  - 17.2  -   Symptoms  - No  -   Resting HR  - 102 bpm  -   Resting BP  - 108/72  -   Max Ex. HR  - 120 bpm  -   Max Ex. BP  - 146/70  -   2 Minute Post BP  - 96/62  -      Initial Exercise Prescription:     Initial Exercise Prescription - 09/04/16 1400      Date of Initial Exercise RX and Referring Provider   Date 09/04/16   Referring Provider Fransico Him MD     Bike   Level 1   Minutes 10   METs 2.68     NuStep   Level 3   Minutes 10   METs 2     Track   Laps 10   Minutes 10   METs 2.74     Prescription Details   Frequency (times per week) 3   Duration Progress to 30 minutes of continuous aerobic without signs/symptoms of physical distress     Intensity   THRR 40-80% of Max Heartrate 66-133   Ratings of Perceived Exertion 11-13   Perceived Dyspnea 0-4     Progression   Progression Continue to progress  workloads to  maintain intensity without signs/symptoms of physical distress.     Resistance Training   Training Prescription Yes   Weight 2lbs   Reps 10-12      Perform Capillary Blood Glucose checks as needed.  Exercise Prescription Changes:     Exercise Prescription Changes    Row Name 09/10/16 1500             Response to Exercise   Blood Pressure (Admit) 102/70       Blood Pressure (Exercise) 114/60       Blood Pressure (Exit) 94/58       Heart Rate (Admit) 102 bpm       Heart Rate (Exercise) 119 bpm       Heart Rate (Exit) 100 bpm       Rating of Perceived Exertion (Exercise) 16         Progression   Average METs 2.8         Resistance Training   Training Prescription Yes       Weight 2lbs       Reps 10-12         Bike   Level 2       Minutes 10       METs 2.68         NuStep   Level 3       Minutes 10       METs 2.2         Track   Laps 14       Minutes 10       METs 3.44         Home Exercise Plan   Plans to continue exercise at Home       Frequency Add 3 additional days to program exercise sessions.          Exercise Comments:     Exercise Comments    Row Name 09/10/16 1617           Exercise Comments Pt is off to a good start with exericise.          Discharge Exercise Prescription (Final Exercise Prescription Changes):     Exercise Prescription Changes - 09/10/16 1500      Response to Exercise   Blood Pressure (Admit) 102/70   Blood Pressure (Exercise) 114/60   Blood Pressure (Exit) 94/58   Heart Rate (Admit) 102 bpm   Heart Rate (Exercise) 119 bpm   Heart Rate (Exit) 100 bpm   Rating of Perceived Exertion (Exercise) 16     Progression   Average METs 2.8     Resistance Training   Training Prescription Yes   Weight 2lbs   Reps 10-12     Bike   Level 2   Minutes 10   METs 2.68     NuStep   Level 3   Minutes 10   METs 2.2     Track   Laps 14   Minutes 10   METs 3.44     Home Exercise Plan   Plans to  continue exercise at Home   Frequency Add 3 additional days to program exercise sessions.      Nutrition:  Target Goals: Understanding of nutrition guidelines, daily intake of sodium 1500mg , cholesterol 200mg , calories 30% from fat and 7% or less from saturated fats, daily to have 5 or more servings of fruits and vegetables.  Biometrics:     Pre Biometrics - 09/04/16 1444      Pre  Biometrics   Waist Circumference 37 inches   Hip Circumference 43.5 inches   Waist to Hip Ratio 0.85 %   Triceps Skinfold 23 mm   % Body Fat 26 %   Grip Strength 37 kg   Flexibility 8.5 in   Single Leg Stand 22.76 seconds       Nutrition Therapy Plan and Nutrition Goals:     Nutrition Therapy & Goals - 09/04/16 1048      Nutrition Therapy   Diet Therapeutic Lifestyle Changes     Intervention Plan   Intervention Prescribe, educate and counsel regarding individualized specific dietary modifications aiming towards targeted core components such as weight, hypertension, lipid management, diabetes, heart failure and other comorbidities.   Expected Outcomes Short Term Goal: Understand basic principles of dietary content, such as calories, fat, sodium, cholesterol and nutrients.;Long Term Goal: Adherence to prescribed nutrition plan.      Nutrition Discharge: Nutrition Scores:     Nutrition Assessments - 09/04/16 1048      MEDFICTS Scores   Pre Score 7      Nutrition Goals Re-Evaluation:   Psychosocial: Target Goals: Acknowledge presence or absence of depression, maximize coping skills, provide positive support system. Participant is able to verbalize types and ability to use techniques and skills needed for reducing stress and depression.  Initial Review & Psychosocial Screening:     Initial Psych Review & Screening - 09/04/16 [place]? Yes     Screening Interventions   Interventions Encouraged to exercise      Quality of Life Scores:      Quality of Life - 09/10/16 0702      Quality of Life Scores   Health/Function Pre 9.13 %   Socioeconomic Pre 21.69 %   Psych/Spiritual Pre 7.07 %   Family Pre 28.8 %   GLOBAL Pre 14.4 %      PHQ-9: Recent Review Flowsheet Data    Depression screen PHQ 2/9 09/08/2016   Decreased Interest 0   Down, Depressed, Hopeless 1   PHQ - 2 Score 1      Psychosocial Evaluation and Intervention:     Psychosocial Evaluation - 09/11/16 1611      Psychosocial Evaluation & Interventions   Interventions Encouraged to exercise with the program and follow exercise prescription   Comments Pt has supportive family and vast knowledge of his disease process and rehabilitiation.      Psychosocial Re-Evaluation:   Vocational Rehabilitation: Provide vocational rehab assistance to qualifying candidates.   Vocational Rehab Evaluation & Intervention:     Vocational Rehab - 09/04/16 1639      Initial Vocational Rehab Evaluation & Intervention   Assessment shows need for Vocational Rehabilitation No  Pt does not plan to return to work.      Education: Education Goals: Education classes will be provided on a weekly basis, covering required topics. Participant will state understanding/return demonstration of topics presented.  Learning Barriers/Preferences:     Learning Barriers/Preferences - 09/04/16 0900      Learning Barriers/Preferences   Learning Barriers Sight   Learning Preferences None      Education Topics: Count Your Pulse:  -Group instruction provided by verbal instruction, demonstration, patient participation and written materials to support subject.  Instructors address importance of being able to find your pulse and how to count your pulse when at home without a heart monitor.  Patients get hands on experience counting their pulse with  staff help and individually.   Heart Attack, Angina, and Risk Factor Modification:  -Group instruction provided by verbal  instruction, video, and written materials to support subject.  Instructors address signs and symptoms of angina and heart attacks.    Also discuss risk factors for heart disease and how to make changes to improve heart health risk factors.   Functional Fitness:  -Group instruction provided by verbal instruction, demonstration, patient participation, and written materials to support subject.  Instructors address safety measures for doing things around the house.  Discuss how to get up and down off the floor, how to pick things up properly, how to safely get out of a chair without assistance, and balance training.   Meditation and Mindfulness:  -Group instruction provided by verbal instruction, patient participation, and written materials to support subject.  Instructor addresses importance of mindfulness and meditation practice to help reduce stress and improve awareness.  Instructor also leads participants through a meditation exercise.    Stretching for Flexibility and Mobility:  -Group instruction provided by verbal instruction, patient participation, and written materials to support subject.  Instructors lead participants through series of stretches that are designed to increase flexibility thus improving mobility.  These stretches are additional exercise for major muscle groups that are typically performed during regular warm up and cool down.   Hands Only CPR Anytime:  -Group instruction provided by verbal instruction, video, patient participation and written materials to support subject.  Instructors co-teach with AHA video for hands only CPR.  Participants get hands on experience with mannequins.   Nutrition I class: Heart Healthy Eating:  -Group instruction provided by PowerPoint slides, verbal discussion, and written materials to support subject matter. The instructor gives an explanation and review of the Therapeutic Lifestyle Changes diet recommendations, which includes a discussion on  lipid goals, dietary fat, sodium, fiber, plant stanol/sterol esters, sugar, and the components of a well-balanced, healthy diet.   Nutrition II class: Lifestyle Skills:  -Group instruction provided by PowerPoint slides, verbal discussion, and written materials to support subject matter. The instructor gives an explanation and review of label reading, grocery shopping for heart health, heart healthy recipe modifications, and ways to make healthier choices when eating out.   Diabetes Question & Answer:  -Group instruction provided by PowerPoint slides, verbal discussion, and written materials to support subject matter. The instructor gives an explanation and review of diabetes co-morbidities, pre- and post-prandial blood glucose goals, pre-exercise blood glucose goals, signs, symptoms, and treatment of hypoglycemia and hyperglycemia, and foot care basics.   Diabetes Blitz:  -Group instruction provided by PowerPoint slides, verbal discussion, and written materials to support subject matter. The instructor gives an explanation and review of the physiology behind type 1 and type 2 diabetes, diabetes medications and rational behind using different medications, pre- and post-prandial blood glucose recommendations and Hemoglobin A1c goals, diabetes diet, and exercise including blood glucose guidelines for exercising safely.    Portion Distortion:  -Group instruction provided by PowerPoint slides, verbal discussion, written materials, and food models to support subject matter. The instructor gives an explanation of serving size versus portion size, changes in portions sizes over the last 20 years, and what consists of a serving from each food group.   Stress Management:  -Group instruction provided by verbal instruction, video, and written materials to support subject matter.  Instructors review role of stress in heart disease and how to cope with stress positively.     Exercising on Your Own:  -Group  instruction  provided by verbal instruction, power point, and written materials to support subject.  Instructors discuss benefits of exercise, components of exercise, frequency and intensity of exercise, and end points for exercise.  Also discuss use of nitroglycerin and activating EMS.  Review options of places to exercise outside of rehab.  Review guidelines for sex with heart disease.   Cardiac Drugs I:  -Group instruction provided by verbal instruction and written materials to support subject.  Instructor reviews cardiac drug classes: antiplatelets, anticoagulants, beta blockers, and statins.  Instructor discusses reasons, side effects, and lifestyle considerations for each drug class.   Cardiac Drugs II:  -Group instruction provided by verbal instruction and written materials to support subject.  Instructor reviews cardiac drug classes: angiotensin converting enzyme inhibitors (ACE-I), angiotensin II receptor blockers (ARBs), nitrates, and calcium channel blockers.  Instructor discusses reasons, side effects, and lifestyle considerations for each drug class.   Anatomy and Physiology of the Circulatory System:  -Group instruction provided by verbal instruction, video, and written materials to support subject.  Reviews functional anatomy of heart, how it relates to various diagnoses, and what role the heart plays in the overall system.   Knowledge Questionnaire Score:     Knowledge Questionnaire Score - 09/04/16 1431      Knowledge Questionnaire Score   Pre Score 20/24      Core Components/Risk Factors/Patient Goals at Admission:     Personal Goals and Risk Factors at Admission - 09/04/16 1445      Core Components/Risk Factors/Patient Goals on Admission   Heart Failure Yes   Intervention Provide a combined exercise and nutrition program that is supplemented with education, support and counseling about heart failure. Directed toward relieving symptoms such as shortness of breath,  decreased exercise tolerance, and extremity edema.   Expected Outcomes Improve functional capacity of life;Short term: Daily weights obtained and reported for increase. Utilizing diuretic protocols set by physician.;Short term: Attendance in program 2-3 days a week with increased exercise capacity. Reported lower sodium intake. Reported increased fruit and vegetable intake. Reports medication compliance.;Long term: Adoption of self-care skills and reduction of barriers for early signs and symptoms recognition and intervention leading to self-care maintenance.   Personal Goal "To feel as strong as before heart transplant.  Short: improve fatigue/SOB with climbing 2 or more flight of stairs. Long term: get back to lifting weights and `running    Intervention Provide exercise programming to improve overall cardiovascular strength and endurance   Expected Outcomes Pt will be able to increase strength, aerobic capacity, and reduce symptoms of SOB/fatigue.      Core Components/Risk Factors/Patient Goals Review:      Goals and Risk Factor Review    Row Name 09/10/16 1620             Core Components/Risk Factors/Patient Goals Review   Personal Goals Review Other       Review Currently walking 23 minutes 2-3 days/week       Expected Outcomes To achieve exercise and fitness goals with exercise prescription.          Core Components/Risk Factors/Patient Goals at Discharge (Final Review):      Goals and Risk Factor Review - 09/10/16 1620      Core Components/Risk Factors/Patient Goals Review   Personal Goals Review Other   Review Currently walking 23 minutes 2-3 days/week   Expected Outcomes To achieve exercise and fitness goals with exercise prescription.      ITP Comments:     ITP Comments  [place] Name 09/04/16 [phone]           ITP Comments Dr. Fransico Him, Medical Director          Comments:  Pt is making expected progress toward personal goals after completing 4 sessions. Pt  is off to a good start. Recommend continued exercise and life style modification education including  stress management and relaxation techniques to decrease cardiac risk profile. Cherre Huger, BSN

## 2016-09-12 ENCOUNTER — Encounter (HOSPITAL_COMMUNITY)
Admission: RE | Admit: 2016-09-12 | Discharge: 2016-09-12 | Disposition: A | Discharge status: Home / Self Care | Payer: Managed Care, Other (non HMO) | Source: Ambulatory Visit | Attending: Cardiology | Admitting: Cardiology

## 2016-09-12 DIAGNOSIS — Z941 Heart transplant status: Secondary | ICD-10-CM

## 2016-09-15 ENCOUNTER — Encounter (HOSPITAL_COMMUNITY): Payer: Managed Care, Other (non HMO)

## 2016-09-15 ENCOUNTER — Telehealth (HOSPITAL_COMMUNITY): Payer: Self-pay | Admitting: Internal Medicine

## 2016-09-17 ENCOUNTER — Encounter (HOSPITAL_COMMUNITY): Admission: RE | Admit: 2016-09-17 | Payer: Managed Care, Other (non HMO) | Source: Ambulatory Visit

## 2016-09-17 ENCOUNTER — Telehealth (HOSPITAL_COMMUNITY): Payer: Self-pay | Admitting: *Deleted

## 2016-09-19 ENCOUNTER — Encounter (HOSPITAL_COMMUNITY)
Admission: RE | Admit: 2016-09-19 | Discharge: 2016-09-19 | Disposition: A | Discharge status: Home / Self Care | Payer: Managed Care, Other (non HMO) | Source: Ambulatory Visit | Attending: Cardiology | Admitting: Cardiology

## 2016-09-19 DIAGNOSIS — Z941 Heart transplant status: Secondary | ICD-10-CM

## 2016-09-19 NOTE — Progress Notes (Signed)
Reviewed home exercise guidelines with patient including endpoints, temperature precautions, target heart rate and rate of perceived exertion. Pt plans to resume walking at home 3 days per week once he completes cardiac rehab program.. Pt voices understanding of instructions given. Sol Passer, MS, ACSM CCEP

## 2016-09-22 ENCOUNTER — Encounter (HOSPITAL_COMMUNITY)
Admission: RE | Admit: 2016-09-22 | Discharge: 2016-09-22 | Disposition: A | Discharge status: Home / Self Care | Payer: Managed Care, Other (non HMO) | Source: Ambulatory Visit | Attending: Cardiology | Admitting: Cardiology

## 2016-09-22 DIAGNOSIS — Z941 Heart transplant status: Secondary | ICD-10-CM | POA: Diagnosis not present

## 2016-09-24 ENCOUNTER — Encounter (HOSPITAL_COMMUNITY)
Admission: RE | Admit: 2016-09-24 | Discharge: 2016-09-24 | Disposition: A | Discharge status: Home / Self Care | Payer: Managed Care, Other (non HMO) | Source: Ambulatory Visit | Attending: Cardiology | Admitting: Cardiology

## 2016-09-24 DIAGNOSIS — Z941 Heart transplant status: Secondary | ICD-10-CM

## 2016-09-24 NOTE — Progress Notes (Signed)
Glenn Gonzalez 55 y.o. male Nutrition Note Spoke with pt. Nutrition Plan and Nutrition Survey goals reviewed with pt. Pt is following Step 2 of the Therapeutic Lifestyle Changes diet. Pt tries to follow a plant-based style diet and only includes chicken, fish, and eggs "most of the time." Pt aware of nutrition education classes offered and declined to attend the nutrition classes "because I could teach the class." Pt is on prednisone and reports taking Calcium and vitamin D supplements regularly. Pt does not tolerate dairy products. Per discussion, pt eats 6 meals/d due to chronic nausea with use of cellcept. Pt watches sodium intake closely by reading food labels. Pt expressed understanding of the information reviewed.   No results found for: HGBA1C Wt Readings from Last 3 Encounters:  09/04/16 197 lb 5 oz (89.5 kg)  05/06/16 206 lb (93.4 kg)  04/02/16 200 lb (90.7 kg)   Nutrition Diagnosis ? Food-and nutrition-related knowledge deficit related to lack of exposure to information as related to diagnosis of: ? heart transplant  Nutrition Intervention ? Pt's individual nutrition plan reviewed with pt. ? Benefits of adopting Therapeutic Lifestyle Changes discussed when Medficts reviewed. ? Pt to attend the Portion Distortion class  ? Continue client-centered nutrition education by RD, as part of interdisciplinary care. Goal(s) ? Pt to describe the benefit of including fruits, vegetables, whole grains, and low-fat dairy products in a heart healthy meal plan. Monitor and Evaluate progress toward nutrition goal with team. Derek Mound, M.Ed, RD, LDN, CDE 09/24/2016 12:34 PM

## 2016-09-25 ENCOUNTER — Other Ambulatory Visit (HOSPITAL_COMMUNITY)
Admission: RE | Admit: 2016-09-25 | Discharge: 2016-09-25 | Disposition: A | Discharge status: Home / Self Care | Payer: Managed Care, Other (non HMO) | Source: Ambulatory Visit | Attending: Internal Medicine | Admitting: Internal Medicine

## 2016-09-25 DIAGNOSIS — Z029 Encounter for administrative examinations, unspecified: Secondary | ICD-10-CM | POA: Insufficient documentation

## 2016-09-25 LAB — BASIC METABOLIC PANEL
ANION GAP: 9 (ref 5–15)
BUN: 35 mg/dL — ABNORMAL HIGH (ref 6–20)
CALCIUM: 9.2 mg/dL (ref 8.9–10.3)
CO2: 26 mmol/L (ref 22–32)
Chloride: 101 mmol/L (ref 101–111)
Creatinine, Ser: 1.93 mg/dL — ABNORMAL HIGH (ref 0.61–1.24)
GFR calc non Af Amer: 38 mL/min — ABNORMAL LOW (ref >60)
GFR, EST AFRICAN AMERICAN: 44 mL/min — AB (ref >60)
Glucose, Bld: 100 mg/dL — ABNORMAL HIGH (ref 65–99)
Potassium: 4 mmol/L (ref 3.5–5.1)
SODIUM: 136 mmol/L (ref 135–145)

## 2016-09-26 ENCOUNTER — Encounter (HOSPITAL_COMMUNITY)
Admission: RE | Admit: 2016-09-26 | Discharge: 2016-09-26 | Disposition: A | Discharge status: Home / Self Care | Payer: Managed Care, Other (non HMO) | Source: Ambulatory Visit | Attending: Cardiology | Admitting: Cardiology

## 2016-09-26 DIAGNOSIS — Z941 Heart transplant status: Secondary | ICD-10-CM | POA: Diagnosis not present

## 2016-09-26 LAB — TACROLIMUS LEVEL: Tacrolimus (FK506) - LabCorp: 8.2 ng/mL (ref 2.0–20.0)

## 2016-09-29 ENCOUNTER — Encounter (HOSPITAL_COMMUNITY)
Admission: RE | Admit: 2016-09-29 | Discharge: 2016-09-29 | Disposition: A | Discharge status: Home / Self Care | Payer: Managed Care, Other (non HMO) | Source: Ambulatory Visit | Attending: Cardiology | Admitting: Cardiology

## 2016-09-29 DIAGNOSIS — Z941 Heart transplant status: Secondary | ICD-10-CM | POA: Diagnosis not present

## 2016-10-01 ENCOUNTER — Encounter (HOSPITAL_COMMUNITY)
Admission: RE | Admit: 2016-10-01 | Discharge: 2016-10-01 | Disposition: A | Discharge status: Home / Self Care | Payer: Managed Care, Other (non HMO) | Source: Ambulatory Visit | Attending: Cardiology | Admitting: Cardiology

## 2016-10-01 DIAGNOSIS — Z941 Heart transplant status: Secondary | ICD-10-CM | POA: Insufficient documentation

## 2016-10-02 NOTE — Progress Notes (Signed)
QUALITY OF LIFE SCORE REVIEW  Pt completed Quality of Life survey as a participant in Cardiac Rehab. Scores 21.0 or below are considered low. Pt score very low in several areas.  Pt scored the following     Quality of Life - 09/10/16 0702      Quality of Life Scores   Health/Function Pre 9.13 %   Socioeconomic Pre 21.69 %   Psych/Spiritual Pre 7.07 %   Family Pre 28.8 %   GLOBAL Pre 14.4 %    although pt scored low he does not feel he has any issues with depression.  This is to be suspected with someone who has had a cardiac transplant and been sick with cardiac issues for as long as he had.  Pt feels no further interventions are needed.  Pt feels he has a supportive family.  Will continue to monitor and intervene as necessary. Cherre Huger, BSN

## 2016-10-03 ENCOUNTER — Encounter (HOSPITAL_COMMUNITY)
Admission: RE | Admit: 2016-10-03 | Discharge: 2016-10-03 | Disposition: A | Discharge status: Home / Self Care | Payer: Managed Care, Other (non HMO) | Source: Ambulatory Visit | Attending: Cardiology | Admitting: Cardiology

## 2016-10-03 DIAGNOSIS — Z941 Heart transplant status: Secondary | ICD-10-CM

## 2016-10-06 ENCOUNTER — Encounter (HOSPITAL_COMMUNITY)
Admission: RE | Admit: 2016-10-06 | Discharge: 2016-10-06 | Disposition: A | Discharge status: Home / Self Care | Payer: Managed Care, Other (non HMO) | Source: Ambulatory Visit | Attending: Cardiology | Admitting: Cardiology

## 2016-10-06 DIAGNOSIS — Z941 Heart transplant status: Secondary | ICD-10-CM

## 2016-10-08 ENCOUNTER — Encounter (HOSPITAL_COMMUNITY)
Admission: RE | Admit: 2016-10-08 | Discharge: 2016-10-08 | Disposition: A | Discharge status: Home / Self Care | Payer: Managed Care, Other (non HMO) | Source: Ambulatory Visit | Attending: Cardiology | Admitting: Cardiology

## 2016-10-08 DIAGNOSIS — Z941 Heart transplant status: Secondary | ICD-10-CM | POA: Diagnosis not present

## 2016-10-09 NOTE — Progress Notes (Signed)
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  acetaminophen (TYLENOL) 650 MG CR tablet, Take 1,300 mg by mouth at bedtime. , Disp: , Rfl:  .  aspirin EC 81 MG tablet, Take 81 mg by mouth daily., Disp: , Rfl:  .  calcium carbonate (OSCAL) 1500 (600 Ca) MG TABS tablet, Take 1,500 mg by mouth 2 (two) times daily with a meal. Separated from transplant medications., Disp: , Rfl:  .  cetirizine (ZYRTEC) 10 MG tablet, Take 10 mg by mouth at bedtime. , Disp: , Rfl:  .  cholecalciferol (VITAMIN D) 400 units TABS tablet, Take 400 Units by mouth 2 (two) times daily. Takes with calcium (has combination tablet., Disp: , Rfl:  .  furosemide (LASIX) 40 MG tablet, Take 40 mg by mouth daily as needed for fluid. , Disp: , Rfl:  .  levothyroxine (SYNTHROID, LEVOTHROID) 100 MCG tablet, Take 100 mcg by mouth daily before breakfast. , Disp: , Rfl:  .  magnesium oxide (MAG-OX) 400 MG tablet, Take 400 mg by mouth 2 (two) times daily. Separates from transplant meds. Takes with Ca/Vit D., Disp: , Rfl:  .  mycophenolate (CELLCEPT) 500 MG tablet, Take 1,000 mg by mouth every 12 (twelve) hours., Disp: , Rfl:  .  nystatin (MYCOSTATIN) 100000 UNIT/ML suspension, Take 5 mLs by mouth 4 (four) times daily. Swish and swallow after meals and bedtime. Do not eat or drink for 15 minutes., Disp: , Rfl:  .  pravastatin (PRAVACHOL) 20 MG tablet, Take 20  mg by mouth at bedtime., Disp: , Rfl:  .  predniSONE (DELTASONE) 10 MG tablet, Take 12.5 mg by mouth daily with breakfast., Disp: , Rfl:  .  RABEprazole (ACIPHEX) 20 MG tablet, Take 1 tablet (20 mg total) by mouth 2 (two) times daily., Disp: 180 tablet, Rfl: 3 .  sennosides-docusate sodium (SENOKOT-S) 8.6-50 MG tablet, Take 2 tablets by mouth 2 (two) times daily., Disp: , Rfl:  .  sulfamethoxazole-trimethoprim (BACTRIM,SEPTRA) 400-80 MG tablet, Take 1 tablet by mouth daily., Disp: , Rfl:  .  tacrolimus (PROGRAF) 1 MG capsule, Take 4 mg by mouth every 12 (twelve) hours., Disp: , Rfl:  .  valACYclovir (VALTREX) 500 MG tablet, Take 500 mg by mouth every 12 (twelve) hours., Disp: , Rfl:   Past Medical History: Past Medical History:  Diagnosis Date  . A-fib ([place])   . Asthma    pt. denies  . CHF (congestive heart failure) ([place])   . Depression   . GERD (gastroesophageal reflux disease)   . History of kidney stones   . History of radiation therapy   . Hodgkin's lymphoma ([place]) 1980   IIIB. 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  . Hypothyroidism   . Pacemaker 2011   [place] device/2011  . Pneumonia    recurrent pneumonia sedf.  rad tx for lymphoma  . Shortness of breath dyspnea  with exertion    Tobacco Use: History  Smoking Status  . Never Smoker  Smokeless Tobacco  . Never Used    Labs: Recent Review Flowsheet Data    Labs for ITP Cardiac and Pulmonary Rehab Latest Ref Rng & Units 05/12/2008 04/20/2015 04/20/2015 04/02/2016 04/02/2016   Cholestrol 0 - 200 mg/dL 174 - - - -   LDLCALC 0 - 99 mg/dL 131(H) - - - -   LDLDIRECT mg/dL - - - - -   HDL >39.0 mg/dL 31.1(L) - - - -   Trlycerides 0 - 149 mg/dL 60 - - - -   HCO3 20.0 - 24.0 mEq/L - 25.8(H) 25.7(H) 25.3(H) 25.1(H)   TCO2 0 - 100 mmol/L - 27 27 27 26    ACIDBASEDEF 0.0 - 2.0 mmol/L - - - 1.0 1.0   O2SAT % - 60.0 63.0 53.0 52.0      Capillary Blood Glucose: No results found for:  GLUCAP   Exercise Target Goals:    Exercise Program Goal: Individual exercise prescription set with THRR, safety & activity barriers. Participant demonstrates ability to understand and report RPE using BORG scale, to self-measure pulse accurately, and to acknowledge the importance of the exercise prescription.  Exercise Prescription Goal: Starting with aerobic activity 30 plus minutes a day, 3 days per week for initial exercise prescription. Provide home exercise prescription and guidelines that participant acknowledges understanding prior to discharge.  Activity Barriers & Risk Stratification:     Activity Barriers & Cardiac Risk Stratification - 09/04/16 1639      Activity Barriers & Cardiac Risk Stratification   Activity Barriers Muscular Weakness      6 Minute Walk:     6 Minute Walk    Row Name 09/04/16 1428 09/04/16 1434 09/04/16 1650     6 Minute Walk   Phase Initial Initial Initial   Distance  - 1729 feet  -   Walk Time  - 6 minutes  -   # of Rest Breaks  - 0  -   MPH  - 3.3  -   METS  - 4.9  -   RPE  - 11  -   VO2 Peak  - 17.2  -   Symptoms  - No  -   Resting HR  - 102 bpm  -   Resting BP  - 108/72  -   Max Ex. HR  - 120 bpm  -   Max Ex. BP  - 146/70  -   2 Minute Post BP  - 96/62  -      Initial Exercise Prescription:     Initial Exercise Prescription - 09/04/16 1400      Date of Initial Exercise RX and Referring Provider   Date 09/04/16   Referring Provider Fransico Him MD     Bike   Level 1   Minutes 10   METs 2.68     NuStep   Level 3   Minutes 10   METs 2     Track   Laps 10   Minutes 10   METs 2.74     Prescription Details   Frequency (times per week) 3   Duration Progress to 30 minutes of continuous aerobic without signs/symptoms of physical distress     Intensity   THRR 40-80% of Max Heartrate 66-133   Ratings of Perceived Exertion 11-13   Perceived Dyspnea 0-4     Progression   Progression Continue to progress  workloads to  maintain intensity without signs/symptoms of physical distress.     Resistance Training   Training Prescription Yes   Weight 2lbs   Reps 10-12      Perform Capillary Blood Glucose checks as needed.  Exercise Prescription Changes:     Exercise Prescription Changes    Row Name 09/10/16 1500 09/24/16 1600 10/07/16 1000         Exercise Review   Progression  - Yes Yes       Response to Exercise   Blood Pressure (Admit) 102/70 101/70 98/70     Blood Pressure (Exercise) 114/60 126/62 108/62     Blood Pressure (Exit) 94/58 104/68 96/56     Heart Rate (Admit) 102 bpm 98 bpm 99 bpm     Heart Rate (Exercise) 119 bpm 128 bpm 127 bpm     Heart Rate (Exit) 100 bpm 95 bpm 109 bpm     Rating of Perceived Exertion (Exercise) 16 15 17      Symptoms  - none none     Comments  - Reviewed home exercise guidelines on 09/19/16. Reviewed home exercise guidelines on 09/19/16.     Duration  - Progress to 30 minutes of continuous aerobic without signs/symptoms of physical distress Progress to 30 minutes of continuous aerobic without signs/symptoms of physical distress     Intensity  - THRR unchanged THRR unchanged       Progression   Progression  - Continue to progress workloads to maintain intensity without signs/symptoms of physical distress. Continue to progress workloads to maintain intensity without signs/symptoms of physical distress.     Average METs 2.8 3.1 3.7       Resistance Training   Training Prescription Yes Yes Yes     Weight 2lbs 5lbs 8lbs     Reps 10-12 10-12 10-12       Bike   Level 2 0.8 1.1     Minutes 10 10 10      METs 2.68 2.7 3.35       NuStep   Level 3 3 3      Minutes 10 10 10      METs 2.2 3.8 4.3       Track   Laps 14 16 15      Minutes 10 10 10      METs 3.44 3.79 3.6       Home Exercise Plan   Plans to continue exercise at [place]     Frequency Add 3 additional days to program exercise sessions. Add 3 additional days to program exercise  sessions. Add 3 additional days to program exercise sessions.        Exercise Comments:     Exercise Comments    Row Name 09/10/16 1617 09/19/16 1231 10/07/16 1028       Exercise Comments Pt is off to a good start with exericise. Participant plans to resume walking at home 3 days per week once he completes the cardiac rehab program. Rreviewed METs and goals. Pt is tolerating exercise well; will continue to monitor exercise progression        Discharge Exercise Prescription (Final Exercise Prescription Changes):     Exercise Prescription Changes - 10/07/16 1000      Exercise Review   Progression Yes     Response to Exercise   Blood Pressure (Admit) 98/70   Blood Pressure (Exercise) 108/62   Blood Pressure (Exit) 96/56   Heart Rate (Admit) 99 bpm   Heart Rate (Exercise) 127 bpm   Heart  Rate (Exit) 109 bpm   Rating of Perceived Exertion (Exercise) 17   Symptoms none   Comments Reviewed home exercise guidelines on 09/19/16.   Duration Progress to 30 minutes of continuous aerobic without signs/symptoms of physical distress   Intensity THRR unchanged     Progression   Progression Continue to progress workloads to maintain intensity without signs/symptoms of physical distress.   Average METs 3.7     Resistance Training   Training Prescription Yes   Weight 8lbs   Reps 10-12     Bike   Level 1.1   Minutes 10   METs 3.35     NuStep   Level 3   Minutes 10   METs 4.3     Track   Laps 15   Minutes 10   METs 3.6     Home Exercise Plan   Plans to continue exercise at Home   Frequency Add 3 additional days to program exercise sessions.      Nutrition:  Target Goals: Understanding of nutrition guidelines, daily intake of sodium 1500mg , cholesterol 200mg , calories 30% from fat and 7% or less from saturated fats, daily to have 5 or more servings of fruits and vegetables.  Biometrics:     Pre Biometrics - 09/04/16 1444      Pre Biometrics   Waist Circumference  37 inches   Hip Circumference 43.5 inches   Waist to Hip Ratio 0.85 %   Triceps Skinfold 23 mm   % Body Fat 26 %   Grip Strength 37 kg   Flexibility 8.5 in   Single Leg Stand 22.76 seconds       Nutrition Therapy Plan and Nutrition Goals:     Nutrition Therapy & Goals - 09/04/16 1048      Nutrition Therapy   Diet Therapeutic Lifestyle Changes     Intervention Plan   Intervention Prescribe, educate and counsel regarding individualized specific dietary modifications aiming towards targeted core components such as weight, hypertension, lipid management, diabetes, heart failure and other comorbidities.   Expected Outcomes Short Term Goal: Understand basic principles of dietary content, such as calories, fat, sodium, cholesterol and nutrients.;Long Term Goal: Adherence to prescribed nutrition plan.      Nutrition Discharge: Nutrition Scores:     Nutrition Assessments - 09/04/16 1048      MEDFICTS Scores   Pre Score 7      Nutrition Goals Re-Evaluation:   Psychosocial: Target Goals: Acknowledge presence or absence of depression, maximize coping skills, provide positive support system. Participant is able to verbalize types and ability to use techniques and skills needed for reducing stress and depression.  Initial Review & Psychosocial Screening:     Initial Psych Review & Screening - 09/04/16 [place]? Yes     Screening Interventions   Interventions Encouraged to exercise      Quality of Life Scores:     Quality of Life - 09/10/16 0702      Quality of Life Scores   Health/Function Pre 9.13 %   Socioeconomic Pre 21.69 %   Psych/Spiritual Pre 7.07 %   Family Pre 28.8 %   GLOBAL Pre 14.4 %      PHQ-9: Recent Review Flowsheet Data    Depression screen PHQ 2/9 09/08/2016   Decreased Interest 0   Down, Depressed, Hopeless 1   PHQ - 2 Score 1      Psychosocial Evaluation and Intervention:  Psychosocial  Evaluation - 10/09/16 1423      Psychosocial Evaluation & Interventions   Interventions Encouraged to exercise with the program and follow exercise prescription   Comments Pt has supportive family and vast knowledge of his disease process and rehabilitiation.     Discharge Psychosocial Assessment & Intervention   Discharge Continue support measures as needed      Psychosocial Re-Evaluation:     Psychosocial Re-Evaluation    [place] Name 10/09/16 1423             Psychosocial Re-Evaluation   Interventions Encouraged to attend Cardiac Rehabilitation for the exercise          Vocational Rehabilitation: Provide vocational rehab assistance to qualifying candidates.   Vocational Rehab Evaluation & Intervention:     Vocational Rehab - 09/04/16 1639      Initial Vocational Rehab Evaluation & Intervention   Assessment shows need for Vocational Rehabilitation No  Pt does not plan to return to work.      Education: Education Goals: Education classes will be provided on a weekly basis, covering required topics. Participant will state understanding/return demonstration of topics presented.  Learning Barriers/Preferences:     Learning Barriers/Preferences - 09/04/16 0900      Learning Barriers/Preferences   Learning Barriers Sight   Learning Preferences None      Education Topics: Count Your Pulse:  -Group instruction provided by verbal instruction, demonstration, patient participation and written materials to support subject.  Instructors address importance of being able to find your pulse and how to count your pulse when at home without a heart monitor.  Patients get hands on experience counting their pulse with staff help and individually.   Heart Attack, Angina, and Risk Factor Modification:  -Group instruction provided by verbal instruction, video, and written materials to support subject.  Instructors address signs and symptoms of angina and heart attacks.    Also  discuss risk factors for heart disease and how to make changes to improve heart health risk factors.   Functional Fitness:  -Group instruction provided by verbal instruction, demonstration, patient participation, and written materials to support subject.  Instructors address safety measures for doing things around the house.  Discuss how to get up and down off the floor, how to pick things up properly, how to safely get out of a chair without assistance, and balance training.   Meditation and Mindfulness:  -Group instruction provided by verbal instruction, patient participation, and written materials to support subject.  Instructor addresses importance of mindfulness and meditation practice to help reduce stress and improve awareness.  Instructor also leads participants through a meditation exercise.    Stretching for Flexibility and Mobility:  -Group instruction provided by verbal instruction, patient participation, and written materials to support subject.  Instructors lead participants through series of stretches that are designed to increase flexibility thus improving mobility.  These stretches are additional exercise for major muscle groups that are typically performed during regular warm up and cool down. Flowsheet Row CARDIAC REHAB PHASE II EXERCISE from 10/08/2016 in [place]  Date  09/26/16  Educator  Seward Carol  Instruction Review Code  2- meets goals/outcomes      Hands Only CPR Anytime:  -Group instruction provided by verbal instruction, video, patient participation and written materials to support subject.  Instructors co-teach with AHA video for hands only CPR.  Participants get hands on experience with mannequins.   Nutrition I class: Heart Healthy Eating:  -Group instruction  provided by PowerPoint slides, verbal discussion, and written materials to support subject matter. The instructor gives an explanation and review of the Therapeutic  Lifestyle Changes diet recommendations, which includes a discussion on lipid goals, dietary fat, sodium, fiber, plant stanol/sterol esters, sugar, and the components of a well-balanced, healthy diet.   Nutrition II class: Lifestyle Skills:  -Group instruction provided by PowerPoint slides, verbal discussion, and written materials to support subject matter. The instructor gives an explanation and review of label reading, grocery shopping for heart health, heart healthy recipe modifications, and ways to make healthier choices when eating out.   Diabetes Question & Answer:  -Group instruction provided by PowerPoint slides, verbal discussion, and written materials to support subject matter. The instructor gives an explanation and review of diabetes co-morbidities, pre- and post-prandial blood glucose goals, pre-exercise blood glucose goals, signs, symptoms, and treatment of hypoglycemia and hyperglycemia, and foot care basics.   Diabetes Blitz:  -Group instruction provided by PowerPoint slides, verbal discussion, and written materials to support subject matter. The instructor gives an explanation and review of the physiology behind type 1 and type 2 diabetes, diabetes medications and rational behind using different medications, pre- and post-prandial blood glucose recommendations and Hemoglobin A1c goals, diabetes diet, and exercise including blood glucose guidelines for exercising safely.    Portion Distortion:  -Group instruction provided by PowerPoint slides, verbal discussion, written materials, and food models to support subject matter. The instructor gives an explanation of serving size versus portion size, changes in portions sizes over the last 20 years, and what consists of a serving from each food group.   Stress Management:  -Group instruction provided by verbal instruction, video, and written materials to support subject matter.  Instructors review role of stress in heart disease and how  to cope with stress positively.     Exercising on Your Own:  -Group instruction provided by verbal instruction, power point, and written materials to support subject.  Instructors discuss benefits of exercise, components of exercise, frequency and intensity of exercise, and end points for exercise.  Also discuss use of nitroglycerin and activating EMS.  Review options of places to exercise outside of rehab.  Review guidelines for sex with heart disease.   Cardiac Drugs I:  -Group instruction provided by verbal instruction and written materials to support subject.  Instructor reviews cardiac drug classes: antiplatelets, anticoagulants, beta blockers, and statins.  Instructor discusses reasons, side effects, and lifestyle considerations for each drug class.   Cardiac Drugs II:  -Group instruction provided by verbal instruction and written materials to support subject.  Instructor reviews cardiac drug classes: angiotensin converting enzyme inhibitors (ACE-I), angiotensin II receptor blockers (ARBs), nitrates, and calcium channel blockers.  Instructor discusses reasons, side effects, and lifestyle considerations for each drug class. Flowsheet Row CARDIAC REHAB PHASE II EXERCISE from 10/08/2016 in [place]  Date  10/08/16  Instruction Review Code  2- meets goals/outcomes      Anatomy and Physiology of the Circulatory System:  -Group instruction provided by verbal instruction, video, and written materials to support subject.  Reviews functional anatomy of heart, how it relates to various diagnoses, and what role the heart plays in the overall system.   Knowledge Questionnaire Score:     Knowledge Questionnaire Score - 09/04/16 1431      Knowledge Questionnaire Score   Pre Score 20/24      Core Components/Risk Factors/Patient Goals at Admission:     Personal Goals and Risk Factors at  Admission - 09/04/16 1445      Core Components/Risk Factors/Patient  Goals on Admission   Heart Failure Yes   Intervention Provide a combined exercise and nutrition program that is supplemented with education, support and counseling about heart failure. Directed toward relieving symptoms such as shortness of breath, decreased exercise tolerance, and extremity edema.   Expected Outcomes Improve functional capacity of life;Short term: Daily weights obtained and reported for increase. Utilizing diuretic protocols set by physician.;Short term: Attendance in program 2-3 days a week with increased exercise capacity. Reported lower sodium intake. Reported increased fruit and vegetable intake. Reports medication compliance.;Long term: Adoption of self-care skills and reduction of barriers for early signs and symptoms recognition and intervention leading to self-care maintenance.   Personal Goal "To feel as strong as before heart transplant.  Short: improve fatigue/SOB with climbing 2 or more flight of stairs. Long term: get back to lifting weights and `running    Intervention Provide exercise programming to improve overall cardiovascular strength and endurance   Expected Outcomes Pt will be able to increase strength, aerobic capacity, and reduce symptoms of SOB/fatigue.      Core Components/Risk Factors/Patient Goals Review:      Goals and Risk Factor Review    Row Name 09/10/16 1620 10/07/16 1028           Core Components/Risk Factors/Patient Goals Review   Personal Goals Review Other Increase Strength and Stamina;Improve shortness of breath with ADL's;Other      Review Currently walking 23 minutes 2-3 days/week Pt is interested in doing strength training.  Will send a strength training request to cardiologist. Pt noticed increase fatigue at third station regardless of order/equipment.      Expected Outcomes To achieve exercise and fitness goals with exercise prescription. Pt will experience less symptoms of fatgue with aerobic exercise and improve in overall  cardiovascular fitness         Core Components/Risk Factors/Patient Goals at Discharge (Final Review):      Goals and Risk Factor Review - 10/07/16 1028      Core Components/Risk Factors/Patient Goals Review   Personal Goals Review Increase Strength and Stamina;Improve shortness of breath with ADL's;Other   Review Pt is interested in doing strength training.  Will send a strength training request to cardiologist. Pt noticed increase fatigue at third station regardless of order/equipment.   Expected Outcomes Pt will experience less symptoms of fatgue with aerobic exercise and improve in overall cardiovascular fitness      ITP Comments:     ITP Comments    Row Name 09/04/16 0853           ITP Comments Dr. Fransico Him, Medical Director          Comments:  Pt is making expected progress toward personal goals after completing 13 sessions. Psychosocial Assessment - No needs identified, no intervention warranted. Recommend continued exercise and life style modification education including  stress management and relaxation techniques to decrease cardiac risk profile. Cherre Huger, BSN

## 2016-10-10 ENCOUNTER — Encounter (HOSPITAL_COMMUNITY)
Admission: RE | Admit: 2016-10-10 | Discharge: 2016-10-10 | Disposition: A | Discharge status: Home / Self Care | Payer: Managed Care, Other (non HMO) | Source: Ambulatory Visit | Attending: Cardiology | Admitting: Cardiology

## 2016-10-10 DIAGNOSIS — Z941 Heart transplant status: Secondary | ICD-10-CM | POA: Diagnosis not present

## 2016-10-13 ENCOUNTER — Encounter (HOSPITAL_COMMUNITY)
Admission: RE | Admit: 2016-10-13 | Discharge: 2016-10-13 | Disposition: A | Discharge status: Home / Self Care | Payer: Managed Care, Other (non HMO) | Source: Ambulatory Visit | Attending: Cardiology | Admitting: Cardiology

## 2016-10-13 DIAGNOSIS — Z941 Heart transplant status: Secondary | ICD-10-CM | POA: Diagnosis not present

## 2016-10-15 ENCOUNTER — Encounter (HOSPITAL_COMMUNITY)
Admission: RE | Admit: 2016-10-15 | Discharge: 2016-10-15 | Disposition: A | Discharge status: Home / Self Care | Payer: Managed Care, Other (non HMO) | Source: Ambulatory Visit | Attending: Cardiology | Admitting: Cardiology

## 2016-10-15 DIAGNOSIS — Z941 Heart transplant status: Secondary | ICD-10-CM | POA: Diagnosis not present

## 2016-10-17 ENCOUNTER — Encounter (HOSPITAL_COMMUNITY)
Admission: RE | Admit: 2016-10-17 | Discharge: 2016-10-17 | Disposition: A | Discharge status: Home / Self Care | Payer: Managed Care, Other (non HMO) | Source: Ambulatory Visit | Attending: Cardiology | Admitting: Cardiology

## 2016-10-17 DIAGNOSIS — Z941 Heart transplant status: Secondary | ICD-10-CM

## 2016-10-20 ENCOUNTER — Encounter (HOSPITAL_COMMUNITY)
Admission: RE | Admit: 2016-10-20 | Discharge: 2016-10-20 | Disposition: A | Discharge status: Home / Self Care | Payer: Managed Care, Other (non HMO) | Source: Ambulatory Visit | Attending: Cardiology | Admitting: Cardiology

## 2016-10-20 DIAGNOSIS — Z941 Heart transplant status: Secondary | ICD-10-CM | POA: Diagnosis not present

## 2016-10-22 ENCOUNTER — Encounter (HOSPITAL_COMMUNITY): Payer: Managed Care, Other (non HMO)

## 2016-10-27 ENCOUNTER — Encounter (HOSPITAL_COMMUNITY)
Admission: RE | Admit: 2016-10-27 | Discharge: 2016-10-27 | Disposition: A | Discharge status: Home / Self Care | Payer: Managed Care, Other (non HMO) | Source: Ambulatory Visit | Attending: Cardiology | Admitting: Cardiology

## 2016-10-27 DIAGNOSIS — Z941 Heart transplant status: Secondary | ICD-10-CM | POA: Diagnosis not present

## 2016-10-29 ENCOUNTER — Encounter (HOSPITAL_COMMUNITY)
Admission: RE | Admit: 2016-10-29 | Discharge: 2016-10-29 | Disposition: A | Discharge status: Home / Self Care | Payer: Managed Care, Other (non HMO) | Source: Ambulatory Visit | Attending: Cardiology | Admitting: Cardiology

## 2016-10-29 DIAGNOSIS — Z941 Heart transplant status: Secondary | ICD-10-CM

## 2016-10-29 NOTE — Progress Notes (Signed)
Nutrition Note Spoke with pt. Pt concerned re: kidney function. Pt wants to try to preserve kidney function through diet. Pt with CKD stage 3 according to pt's GFR. Pt educated re: CKD diet. Pt is already following a low sodium diet (~700-800 mg sodium/d per pt). Pt expressed understanding of the information reviewed via feedback method.  Estimated nutrition needs 2200-2600 kcal and 53-70 gm protein.  Continue client-centered nutrition education by RD as part of interdisciplinary care.  Monitor and evaluate progress toward nutrition goal with team.  Derek Mound, M.Ed, RD, LDN, CDE 10/29/2016 1:57 PM [facility]    Component Value Date/Time   NA 136 09/25/2016 0800   K 4.0 09/25/2016 0800   CL 101 09/25/2016 0800   CO2 26 09/25/2016 0800   GLUCOSE 100 (H) 09/25/2016 0800   BUN 35 (H) 09/25/2016 0800   CREATININE 1.93 (H) 09/25/2016 0800   CREATININE 1.30 04/23/2016 1146   CALCIUM 9.2 09/25/2016 0800   GFRNONAA 38 (L) 09/25/2016 0800   GFRAA 44 (L) 09/25/2016 0800

## 2016-10-31 ENCOUNTER — Encounter (HOSPITAL_COMMUNITY): Payer: Managed Care, Other (non HMO)

## 2016-11-03 ENCOUNTER — Encounter (HOSPITAL_COMMUNITY)
Admission: RE | Admit: 2016-11-03 | Discharge: 2016-11-03 | Disposition: A | Discharge status: Home / Self Care | Payer: Managed Care, Other (non HMO) | Source: Ambulatory Visit | Attending: Cardiology | Admitting: Cardiology

## 2016-11-03 DIAGNOSIS — Z941 Heart transplant status: Secondary | ICD-10-CM

## 2016-11-05 ENCOUNTER — Encounter (HOSPITAL_COMMUNITY)
Admission: RE | Admit: 2016-11-05 | Discharge: 2016-11-05 | Disposition: A | Discharge status: Home / Self Care | Payer: Managed Care, Other (non HMO) | Source: Ambulatory Visit | Attending: Cardiology | Admitting: Cardiology

## 2016-11-05 DIAGNOSIS — Z941 Heart transplant status: Secondary | ICD-10-CM | POA: Diagnosis not present

## 2016-11-06 NOTE — Progress Notes (Signed)
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  acetaminophen (TYLENOL) 650 MG CR tablet, Take 1,300 mg by mouth at bedtime. , Disp: , Rfl:  .  aspirin EC 81 MG tablet, Take 81 mg by mouth daily., Disp: , Rfl:  .  calcium carbonate (OSCAL) 1500 (600 Ca) MG TABS tablet, Take 1,500 mg by mouth 2 (two) times daily with a meal. Separated from transplant medications., Disp: , Rfl:  .  cetirizine (ZYRTEC) 10 MG tablet, Take 10 mg by mouth at bedtime. , Disp: , Rfl:  .  cholecalciferol (VITAMIN D) 400 units TABS tablet, Take 400 Units by mouth 2 (two) times daily. Takes with calcium (has combination tablet., Disp: , Rfl:  .  furosemide (LASIX) 40 MG tablet, Take 40 mg by mouth daily as needed for fluid. , Disp: , Rfl:  .  levothyroxine (SYNTHROID, LEVOTHROID) 100 MCG tablet, Take 100 mcg by mouth daily before breakfast. , Disp: , Rfl:  .  magnesium oxide (MAG-OX) 400 MG tablet, Take 400 mg by mouth 2 (two) times daily. Separates from transplant meds. Takes with Ca/Vit D., Disp: , Rfl:  .  mycophenolate (CELLCEPT) 500 MG tablet, Take 1,000 mg by mouth every 12 (twelve) hours., Disp: , Rfl:  .  nystatin (MYCOSTATIN) 100000 UNIT/ML suspension, Take 5 mLs by mouth 4 (four) times daily. Swish and swallow after meals and bedtime. Do not eat or drink for 15 minutes., Disp: , Rfl:  .  pravastatin (PRAVACHOL) 20 MG tablet, Take 20  mg by mouth at bedtime., Disp: , Rfl:  .  predniSONE (DELTASONE) 10 MG tablet, Take 12.5 mg by mouth daily with breakfast., Disp: , Rfl:  .  RABEprazole (ACIPHEX) 20 MG tablet, Take 1 tablet (20 mg total) by mouth 2 (two) times daily., Disp: 180 tablet, Rfl: 3 .  sennosides-docusate sodium (SENOKOT-S) 8.6-50 MG tablet, Take 2 tablets by mouth 2 (two) times daily., Disp: , Rfl:  .  sulfamethoxazole-trimethoprim (BACTRIM,SEPTRA) 400-80 MG tablet, Take 1 tablet by mouth daily., Disp: , Rfl:  .  tacrolimus (PROGRAF) 1 MG capsule, Take 4 mg by mouth every 12 (twelve) hours., Disp: , Rfl:  .  valACYclovir (VALTREX) 500 MG tablet, Take 500 mg by mouth every 12 (twelve) hours., Disp: , Rfl:   Past Medical History: Past Medical History:  Diagnosis Date  . A-fib ([place])   . Asthma    pt. denies  . CHF (congestive heart failure) ([place])   . Depression   . GERD (gastroesophageal reflux disease)   . History of kidney stones   . History of radiation therapy   . Hodgkin's lymphoma ([place]) 1980   IIIB. 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  . Hypothyroidism   . Pacemaker 2011   [place] device/2011  . Pneumonia    recurrent pneumonia sedf.  rad tx for lymphoma  . Shortness of breath dyspnea  with exertion    Tobacco Use: History  Smoking Status  . Never Smoker  Smokeless Tobacco  . Never Used    Labs: Recent Review Flowsheet Data    Labs for ITP Cardiac and Pulmonary Rehab Latest Ref Rng & Units 05/12/2008 04/20/2015 04/20/2015 04/02/2016 04/02/2016   Cholestrol 0 - 200 mg/dL 174 - - - -   LDLCALC 0 - 99 mg/dL 131(H) - - - -   LDLDIRECT mg/dL - - - - -   HDL >39.0 mg/dL 31.1(L) - - - -   Trlycerides 0 - 149 mg/dL 60 - - - -   HCO3 20.0 - 24.0 mEq/L - 25.8(H) 25.7(H) 25.3(H) 25.1(H)   TCO2 0 - 100 mmol/L - 27 27 27 26    ACIDBASEDEF 0.0 - 2.0 mmol/L - - - 1.0 1.0   O2SAT % - 60.0 63.0 53.0 52.0      Capillary Blood Glucose: No results found for:  GLUCAP   Exercise Target Goals:    Exercise Program Goal: Individual exercise prescription set with THRR, safety & activity barriers. Participant demonstrates ability to understand and report RPE using BORG scale, to self-measure pulse accurately, and to acknowledge the importance of the exercise prescription.  Exercise Prescription Goal: Starting with aerobic activity 30 plus minutes a day, 3 days per week for initial exercise prescription. Provide home exercise prescription and guidelines that participant acknowledges understanding prior to discharge.  Activity Barriers & Risk Stratification:     Activity Barriers & Cardiac Risk Stratification - 09/04/16 1639      Activity Barriers & Cardiac Risk Stratification   Activity Barriers Muscular Weakness      6 Minute Walk:     6 Minute Walk    Row Name 09/04/16 1428 09/04/16 1434 09/04/16 1650     6 Minute Walk   Phase Initial Initial Initial   Distance  - 1729 feet  -   Walk Time  - 6 minutes  -   # of Rest Breaks  - 0  -   MPH  - 3.3  -   METS  - 4.9  -   RPE  - 11  -   VO2 Peak  - 17.2  -   Symptoms  - No  -   Resting HR  - 102 bpm  -   Resting BP  - 108/72  -   Max Ex. HR  - 120 bpm  -   Max Ex. BP  - 146/70  -   2 Minute Post BP  - 96/62  -      Initial Exercise Prescription:     Initial Exercise Prescription - 09/04/16 1400      Date of Initial Exercise RX and Referring Provider   Date 09/04/16   Referring Provider Fransico Him MD     Bike   Level 1   Minutes 10   METs 2.68     NuStep   Level 3   Minutes 10   METs 2     Track   Laps 10   Minutes 10   METs 2.74     Prescription Details   Frequency (times per week) 3   Duration Progress to 30 minutes of continuous aerobic without signs/symptoms of physical distress     Intensity   THRR 40-80% of Max Heartrate 66-133   Ratings of Perceived Exertion 11-13   Perceived Dyspnea 0-4     Progression   Progression Continue to progress  workloads to  maintain intensity without signs/symptoms of physical distress.     Resistance Training   Training Prescription Yes   Weight 2lbs   Reps 10-12      Perform Capillary Blood Glucose checks as needed.  Exercise Prescription Changes:     Exercise Prescription Changes    Row Name 09/10/16 1500 09/24/16 1600 10/07/16 1000 11/04/16 1100       Exercise Review   Progression  - Yes Yes Yes      Response to Exercise   Blood Pressure (Admit) 102/70 101/70 98/70 116/80    Blood Pressure (Exercise) 114/60 126/62 108/62 104/66    Blood Pressure (Exit) 94/58 104/68 96/56 98/64     Heart Rate (Admit) 102 bpm 98 bpm 99 bpm 94 bpm    Heart Rate (Exercise) 119 bpm 128 bpm 127 bpm 123 bpm    Heart Rate (Exit) 100 bpm 95 bpm 109 bpm 99 bpm    Rating of Perceived Exertion (Exercise) 16 15 17 15     Symptoms  - none none none    Comments  - Reviewed home exercise guidelines on 09/19/16. Reviewed home exercise guidelines on 09/19/16. Reviewed home exercise guidelines on 09/19/16.    Duration  - Progress to 30 minutes of continuous aerobic without signs/symptoms of physical distress Progress to 30 minutes of continuous aerobic without signs/symptoms of physical distress Progress to 30 minutes of continuous aerobic without signs/symptoms of physical distress    Intensity  - THRR unchanged THRR unchanged THRR unchanged      Progression   Progression  - Continue to progress workloads to maintain intensity without signs/symptoms of physical distress. Continue to progress workloads to maintain intensity without signs/symptoms of physical distress. Continue to progress workloads to maintain intensity without signs/symptoms of physical distress.    Average METs 2.8 3.1 3.7 3.9      Resistance Training   Training Prescription Yes Yes Yes Yes    Weight 2lbs 5lbs 8lbs 10lbs    Reps 10-12 10-12 10-12 10-12      Bike   Level 2 0.8 1.1 -    Minutes 10 10 10  -    METs 2.68 2.7 3.35 -      NuStep    Level 3 3 3 5     Minutes 10 10 10 10     METs 2.2 3.8 4.3 4.2      Track   Laps 14 16 15 15     Minutes 10 10 10 10     METs 3.44 3.79 3.6 3.6      Home Exercise Plan   Plans to continue exercise at [place]    Frequency Add 3 additional days to program exercise sessions. Add 3 additional days to program exercise sessions. Add 3 additional days to program exercise sessions. Add 3 additional days to program exercise sessions.       Exercise Comments:     Exercise Comments    Row Name 09/10/16 1617 09/19/16 1231 10/07/16 1028 11/04/16 1133     Exercise Comments Pt is off to a good start with exericise. Participant plans to resume walking at home 3 days per week once he completes the cardiac rehab program. Rreviewed METs and goals. Pt is tolerating exercise well; will continue to monitor exercise progression Rreviewed METs and goals. Pt is tolerating exercise well; will continue to monitor exercise progression       Discharge Exercise Prescription (Final Exercise Prescription Changes):     Exercise Prescription Changes - 11/04/16 1100  Exercise Review   Progression Yes     Response to Exercise   Blood Pressure (Admit) 116/80   Blood Pressure (Exercise) 104/66   Blood Pressure (Exit) 98/64   Heart Rate (Admit) 94 bpm   Heart Rate (Exercise) 123 bpm   Heart Rate (Exit) 99 bpm   Rating of Perceived Exertion (Exercise) 15   Symptoms none   Comments Reviewed home exercise guidelines on 09/19/16.   Duration Progress to 30 minutes of continuous aerobic without signs/symptoms of physical distress   Intensity THRR unchanged     Progression   Progression Continue to progress workloads to maintain intensity without signs/symptoms of physical distress.   Average METs 3.9     Resistance Training   Training Prescription Yes   Weight 10lbs   Reps 10-12     Bike   Level --   Minutes --   METs --     NuStep   Level 5   Minutes 10   METs 4.2     Track    Laps 15   Minutes 10   METs 3.6     Home Exercise Plan   Plans to continue exercise at Home   Frequency Add 3 additional days to program exercise sessions.      Nutrition:  Target Goals: Understanding of nutrition guidelines, daily intake of sodium 1500mg , cholesterol 200mg , calories 30% from fat and 7% or less from saturated fats, daily to have 5 or more servings of fruits and vegetables.  Biometrics:     Pre Biometrics - 09/04/16 1444      Pre Biometrics   Waist Circumference 37 inches   Hip Circumference 43.5 inches   Waist to Hip Ratio 0.85 %   Triceps Skinfold 23 mm   % Body Fat 26 %   Grip Strength 37 kg   Flexibility 8.5 in   Single Leg Stand 22.76 seconds       Nutrition Therapy Plan and Nutrition Goals:     Nutrition Therapy & Goals - 09/04/16 1048      Nutrition Therapy   Diet Therapeutic Lifestyle Changes     Intervention Plan   Intervention Prescribe, educate and counsel regarding individualized specific dietary modifications aiming towards targeted core components such as weight, hypertension, lipid management, diabetes, heart failure and other comorbidities.   Expected Outcomes Short Term Goal: Understand basic principles of dietary content, such as calories, fat, sodium, cholesterol and nutrients.;Long Term Goal: Adherence to prescribed nutrition plan.      Nutrition Discharge: Nutrition Scores:     Nutrition Assessments - 09/04/16 1048      MEDFICTS Scores   Pre Score 7      Nutrition Goals Re-Evaluation:   Psychosocial: Target Goals: Acknowledge presence or absence of depression, maximize coping skills, provide positive support system. Participant is able to verbalize types and ability to use techniques and skills needed for reducing stress and depression.  Initial Review & Psychosocial Screening:     Initial Psych Review & Screening - 09/04/16 [place]? Yes     Screening Interventions    Interventions Encouraged to exercise      Quality of Life Scores:     Quality of Life - 09/10/16 0702      Quality of Life Scores   Health/Function Pre 9.13 %   Socioeconomic Pre 21.69 %   Psych/Spiritual Pre 7.07 %   Family Pre 28.8 %   GLOBAL Pre  14.4 %      PHQ-9: Recent Review Flowsheet Data    Depression screen [facility] 2/9 09/08/2016   Decreased Interest 0   Down, Depressed, Hopeless 1   PHQ - 2 Score 1      Psychosocial Evaluation and Intervention:     Psychosocial Evaluation - 11/06/16 1425      Psychosocial Evaluation & Interventions   Interventions Encouraged to exercise with the program and follow exercise prescription   Comments Pt has supportive family and vast knowledge of his disease process and rehabilitiation.     Discharge Psychosocial Assessment & Intervention   Discharge Continue support measures as needed      Psychosocial Re-Evaluation:     Psychosocial Re-Evaluation    [place] Name 10/09/16 1423             Psychosocial Re-Evaluation   Interventions Encouraged to attend Cardiac Rehabilitation for the exercise          Vocational Rehabilitation: Provide vocational rehab assistance to qualifying candidates.   Vocational Rehab Evaluation & Intervention:     Vocational Rehab - 09/04/16 1639      Initial Vocational Rehab Evaluation & Intervention   Assessment shows need for Vocational Rehabilitation No  Pt does not plan to return to work.      Education: Education Goals: Education classes will be provided on a weekly basis, covering required topics. Participant will state understanding/return demonstration of topics presented.  Learning Barriers/Preferences:     Learning Barriers/Preferences - 09/04/16 0900      Learning Barriers/Preferences   Learning Barriers Sight   Learning Preferences None      Education Topics: Count Your Pulse:  -Group instruction provided by verbal instruction, demonstration, patient participation  and written materials to support subject.  Instructors address importance of being able to find your pulse and how to count your pulse when at home without a heart monitor.  Patients get hands on experience counting their pulse with staff help and individually.   Heart Attack, Angina, and Risk Factor Modification:  -Group instruction provided by verbal instruction, video, and written materials to support subject.  Instructors address signs and symptoms of angina and heart attacks.    Also discuss risk factors for heart disease and how to make changes to improve heart health risk factors.   Functional Fitness:  -Group instruction provided by verbal instruction, demonstration, patient participation, and written materials to support subject.  Instructors address safety measures for doing things around the house.  Discuss how to get up and down off the floor, how to pick things up properly, how to safely get out of a chair without assistance, and balance training.   Meditation and Mindfulness:  -Group instruction provided by verbal instruction, patient participation, and written materials to support subject.  Instructor addresses importance of mindfulness and meditation practice to help reduce stress and improve awareness.  Instructor also leads participants through a meditation exercise.    Stretching for Flexibility and Mobility:  -Group instruction provided by verbal instruction, patient participation, and written materials to support subject.  Instructors lead participants through series of stretches that are designed to increase flexibility thus improving mobility.  These stretches are additional exercise for major muscle groups that are typically performed during regular warm up and cool down. Flowsheet Row CARDIAC REHAB PHASE II EXERCISE from 10/08/2016 in [place]  Date  09/26/16  Educator  Seward Carol  Instruction Review Code  2- meets goals/outcomes  Hands Only CPR Anytime:  -Group instruction provided by verbal instruction, video, patient participation and written materials to support subject.  Instructors co-teach with AHA video for hands only CPR.  Participants get hands on experience with mannequins.   Nutrition I class: Heart Healthy Eating:  -Group instruction provided by PowerPoint slides, verbal discussion, and written materials to support subject matter. The instructor gives an explanation and review of the Therapeutic Lifestyle Changes diet recommendations, which includes a discussion on lipid goals, dietary fat, sodium, fiber, plant stanol/sterol esters, sugar, and the components of a well-balanced, healthy diet.   Nutrition II class: Lifestyle Skills:  -Group instruction provided by PowerPoint slides, verbal discussion, and written materials to support subject matter. The instructor gives an explanation and review of label reading, grocery shopping for heart health, heart healthy recipe modifications, and ways to make healthier choices when eating out.   Diabetes Question & Answer:  -Group instruction provided by PowerPoint slides, verbal discussion, and written materials to support subject matter. The instructor gives an explanation and review of diabetes co-morbidities, pre- and post-prandial blood glucose goals, pre-exercise blood glucose goals, signs, symptoms, and treatment of hypoglycemia and hyperglycemia, and foot care basics.   Diabetes Blitz:  -Group instruction provided by PowerPoint slides, verbal discussion, and written materials to support subject matter. The instructor gives an explanation and review of the physiology behind type 1 and type 2 diabetes, diabetes medications and rational behind using different medications, pre- and post-prandial blood glucose recommendations and Hemoglobin A1c goals, diabetes diet, and exercise including blood glucose guidelines for exercising safely.    Portion Distortion:   -Group instruction provided by PowerPoint slides, verbal discussion, written materials, and food models to support subject matter. The instructor gives an explanation of serving size versus portion size, changes in portions sizes over the last 20 years, and what consists of a serving from each food group.   Stress Management:  -Group instruction provided by verbal instruction, video, and written materials to support subject matter.  Instructors review role of stress in heart disease and how to cope with stress positively.     Exercising on Your Own:  -Group instruction provided by verbal instruction, power point, and written materials to support subject.  Instructors discuss benefits of exercise, components of exercise, frequency and intensity of exercise, and end points for exercise.  Also discuss use of nitroglycerin and activating EMS.  Review options of places to exercise outside of rehab.  Review guidelines for sex with heart disease.   Cardiac Drugs I:  -Group instruction provided by verbal instruction and written materials to support subject.  Instructor reviews cardiac drug classes: antiplatelets, anticoagulants, beta blockers, and statins.  Instructor discusses reasons, side effects, and lifestyle considerations for each drug class.   Cardiac Drugs II:  -Group instruction provided by verbal instruction and written materials to support subject.  Instructor reviews cardiac drug classes: angiotensin converting enzyme inhibitors (ACE-I), angiotensin II receptor blockers (ARBs), nitrates, and calcium channel blockers.  Instructor discusses reasons, side effects, and lifestyle considerations for each drug class. Flowsheet Row CARDIAC REHAB PHASE II EXERCISE from 10/08/2016 in [place]  Date  10/08/16  Instruction Review Code  2- meets goals/outcomes      Anatomy and Physiology of the Circulatory System:  -Group instruction provided by verbal instruction,  video, and written materials to support subject.  Reviews functional anatomy of heart, how it relates to various diagnoses, and what role the heart plays in the overall system.  Knowledge Questionnaire Score:     Knowledge Questionnaire Score - 09/04/16 1431      Knowledge Questionnaire Score   Pre Score 20/24      Core Components/Risk Factors/Patient Goals at Admission:     Personal Goals and Risk Factors at Admission - 09/04/16 1445      Core Components/Risk Factors/Patient Goals on Admission   Heart Failure Yes   Intervention Provide a combined exercise and nutrition program that is supplemented with education, support and counseling about heart failure. Directed toward relieving symptoms such as shortness of breath, decreased exercise tolerance, and extremity edema.   Expected Outcomes Improve functional capacity of life;Short term: Daily weights obtained and reported for increase. Utilizing diuretic protocols set by physician.;Short term: Attendance in program 2-3 days a week with increased exercise capacity. Reported lower sodium intake. Reported increased fruit and vegetable intake. Reports medication compliance.;Long term: Adoption of self-care skills and reduction of barriers for early signs and symptoms recognition and intervention leading to self-care maintenance.   Personal Goal "To feel as strong as before heart transplant.  Short: improve fatigue/SOB with climbing 2 or more flight of stairs. Long term: get back to lifting weights and `running    Intervention Provide exercise programming to improve overall cardiovascular strength and endurance   Expected Outcomes Pt will be able to increase strength, aerobic capacity, and reduce symptoms of SOB/fatigue.      Core Components/Risk Factors/Patient Goals Review:      Goals and Risk Factor Review    Row Name 09/10/16 1620 10/07/16 1028 11/04/16 1134         Core Components/Risk Factors/Patient Goals Review   Personal  Goals Review Other Increase Strength and Stamina;Improve shortness of breath with ADL's;Other  -     Review Currently walking 23 minutes 2-3 days/week Pt is interested in doing strength training.  Will send a strength training request to cardiologist. Pt noticed increase fatigue at third station regardless of order/equipment. Received clearance to begin strength training. Pt was oriented to weights and demonstrated good technique.      Expected Outcomes To achieve exercise and fitness goals with exercise prescription. Pt will experience less symptoms of fatgue with aerobic exercise and improve in overall cardiovascular fitness Pt will get stronger and increase lean muscle mass        Core Components/Risk Factors/Patient Goals at Discharge (Final Review):      Goals and Risk Factor Review - 11/04/16 1134      Core Components/Risk Factors/Patient Goals Review   Review Received clearance to begin strength training. Pt was oriented to weights and demonstrated good technique.    Expected Outcomes Pt will get stronger and increase lean muscle mass      ITP Comments:     ITP Comments    Row Name 09/04/16 0853           ITP Comments Dr. Fransico Him, Medical Director          Comments:  Pt is making expected progress toward personal goals after completing 22 sessions appears to be settling in to the routine. Pt seen interacting more with other participants..Repeat Psychosocial Assessment: Pt with supportive family, denies any Psychosocial needs or interventions at this time.  Recommend continued exercise and life style modification education including  stress management and relaxation techniques to decrease cardiac risk profile. Cherre Huger, BSN

## 2016-11-07 ENCOUNTER — Encounter (HOSPITAL_COMMUNITY)
Admission: RE | Admit: 2016-11-07 | Discharge: 2016-11-07 | Disposition: A | Discharge status: Home / Self Care | Payer: Managed Care, Other (non HMO) | Source: Ambulatory Visit | Attending: Cardiology | Admitting: Cardiology

## 2016-11-07 DIAGNOSIS — Z941 Heart transplant status: Secondary | ICD-10-CM

## 2016-11-10 ENCOUNTER — Encounter (HOSPITAL_COMMUNITY)
Admission: RE | Admit: 2016-11-10 | Discharge: 2016-11-10 | Disposition: A | Discharge status: Home / Self Care | Payer: Managed Care, Other (non HMO) | Source: Ambulatory Visit | Attending: Cardiology | Admitting: Cardiology

## 2016-11-10 DIAGNOSIS — Z941 Heart transplant status: Secondary | ICD-10-CM | POA: Diagnosis not present

## 2016-11-12 ENCOUNTER — Encounter (HOSPITAL_COMMUNITY)
Admission: RE | Admit: 2016-11-12 | Discharge: 2016-11-12 | Disposition: A | Discharge status: Home / Self Care | Payer: Managed Care, Other (non HMO) | Source: Ambulatory Visit | Attending: Cardiology | Admitting: Cardiology

## 2016-11-12 DIAGNOSIS — Z941 Heart transplant status: Secondary | ICD-10-CM | POA: Diagnosis not present

## 2016-11-14 ENCOUNTER — Encounter (HOSPITAL_COMMUNITY)
Admission: RE | Admit: 2016-11-14 | Discharge: 2016-11-14 | Disposition: A | Discharge status: Home / Self Care | Payer: Managed Care, Other (non HMO) | Source: Ambulatory Visit | Attending: Cardiology | Admitting: Cardiology

## 2016-11-14 DIAGNOSIS — Z941 Heart transplant status: Secondary | ICD-10-CM | POA: Diagnosis not present

## 2016-11-17 ENCOUNTER — Encounter (HOSPITAL_COMMUNITY)
Admission: RE | Admit: 2016-11-17 | Discharge: 2016-11-17 | Disposition: A | Discharge status: Home / Self Care | Payer: Managed Care, Other (non HMO) | Source: Ambulatory Visit | Attending: Cardiology | Admitting: Cardiology

## 2016-11-17 DIAGNOSIS — Z941 Heart transplant status: Secondary | ICD-10-CM

## 2016-11-19 ENCOUNTER — Encounter (HOSPITAL_COMMUNITY)
Admission: RE | Admit: 2016-11-19 | Discharge: 2016-11-19 | Disposition: A | Discharge status: Home / Self Care | Payer: Managed Care, Other (non HMO) | Source: Ambulatory Visit | Attending: Cardiology | Admitting: Cardiology

## 2016-11-19 DIAGNOSIS — Z941 Heart transplant status: Secondary | ICD-10-CM

## 2016-11-19 NOTE — Progress Notes (Signed)
Pt returned to exercise today.  Pt completed first station and opted to leave early due to fatigue.  Pt with weight gain noted on Monday and declined the offer to notify the heart transplant team.  Pt reported he did call on today when he noted that the weight had not decreased.  Pt advised to take lasix 40 mg this morning.  Noted weight loss of .2kg.  Pt was advised that if his weight had not decreased  By 4 pounds by the end of the day he would need to be admitted to the hospital.  Pt given a copy of his rehab report to take with him in case he is hospitalized.  Pt felt fine to drive self home.  Pt is well versed on his health and well being.  Pt indicated he would let rehab staff know if he was going to be admitted. Cherre Huger, BSN

## 2016-11-21 ENCOUNTER — Encounter (HOSPITAL_COMMUNITY): Payer: Managed Care, Other (non HMO)

## 2016-11-21 ENCOUNTER — Telehealth (HOSPITAL_COMMUNITY): Payer: Self-pay | Admitting: Internal Medicine

## 2016-11-21 NOTE — Telephone Encounter (Signed)
Pt cancelled due to sickness

## 2016-11-26 ENCOUNTER — Other Ambulatory Visit (HOSPITAL_COMMUNITY)
Admission: RE | Admit: 2016-11-26 | Discharge: 2016-11-26 | Disposition: A | Discharge status: Home / Self Care | Payer: Managed Care, Other (non HMO) | Source: Ambulatory Visit | Attending: Internal Medicine | Admitting: Internal Medicine

## 2016-11-26 ENCOUNTER — Encounter (HOSPITAL_COMMUNITY)
Admission: RE | Admit: 2016-11-26 | Discharge: 2016-11-26 | Disposition: A | Discharge status: Home / Self Care | Payer: Managed Care, Other (non HMO) | Source: Ambulatory Visit | Attending: Cardiology | Admitting: Cardiology

## 2016-11-26 DIAGNOSIS — Z941 Heart transplant status: Secondary | ICD-10-CM | POA: Diagnosis not present

## 2016-11-26 DIAGNOSIS — Z029 Encounter for administrative examinations, unspecified: Secondary | ICD-10-CM | POA: Diagnosis present

## 2016-11-26 LAB — BASIC METABOLIC PANEL
Anion gap: 12 (ref 5–15)
BUN: 37 mg/dL — AB (ref 6–20)
CALCIUM: 9 mg/dL (ref 8.9–10.3)
CHLORIDE: 91 mmol/L — AB (ref 101–111)
CO2: 27 mmol/L (ref 22–32)
CREATININE: 2.12 mg/dL — AB (ref 0.61–1.24)
GFR calc Af Amer: 39 mL/min — ABNORMAL LOW (ref >60)
GFR calc non Af Amer: 33 mL/min — ABNORMAL LOW (ref >60)
Glucose, Bld: 86 mg/dL (ref 65–99)
Potassium: 4 mmol/L (ref 3.5–5.1)
SODIUM: 130 mmol/L — AB (ref 135–145)

## 2016-11-26 NOTE — Progress Notes (Signed)
Pt returned to rehab today absent since last Wednesday before the holidays.  Pt with weight remaining the same but pt reports there was a decrease in his weight when he was advised to take diuretics to pull off extra fluid.  Pt had lab work completed prior to coming to exercise today.  Pt is fearful that his heart may be failing or his kidneys have been compromised.  Pt with weight gain of 6 pounds per pts scales at home.  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 Pt tolerated light exercise with complaints of fullness in chest, fatigue and mild shortness of breath.  Pt given copy of labwork, rehab report and monitor strips for review.  Asked pt to please communicate with rehab staff regarding plans to admit for further evaluation. Cherre Huger, BSN

## 2016-11-28 ENCOUNTER — Encounter (HOSPITAL_COMMUNITY)
Admission: RE | Admit: 2016-11-28 | Discharge: 2016-11-28 | Disposition: A | Discharge status: Home / Self Care | Payer: Managed Care, Other (non HMO) | Source: Ambulatory Visit | Attending: Cardiology | Admitting: Cardiology

## 2016-11-28 DIAGNOSIS — Z941 Heart transplant status: Secondary | ICD-10-CM

## 2016-12-03 ENCOUNTER — Encounter (HOSPITAL_COMMUNITY)
Admission: RE | Admit: 2016-12-03 | Discharge: 2016-12-03 | Disposition: A | Discharge status: Home / Self Care | Payer: BLUE CROSS/BLUE SHIELD | Source: Ambulatory Visit | Attending: Cardiology | Admitting: Cardiology

## 2016-12-03 DIAGNOSIS — Z941 Heart transplant status: Secondary | ICD-10-CM | POA: Insufficient documentation

## 2016-12-04 NOTE — Progress Notes (Signed)
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  acetaminophen (TYLENOL) 650 MG CR tablet, Take 1,300 mg by mouth at bedtime. , Disp: , Rfl:  .  aspirin EC 81 MG tablet, Take 81 mg by mouth daily., Disp: , Rfl:  .  calcium carbonate (OSCAL) 1500 (600 Ca) MG TABS tablet, Take 1,500 mg by mouth 2 (two) times daily with a meal. Separated from transplant medications., Disp: , Rfl:  .  cetirizine (ZYRTEC) 10 MG tablet, Take 10 mg by mouth at bedtime. , Disp: , Rfl:  .  cholecalciferol (VITAMIN D) 400 units TABS tablet, Take 400 Units by mouth 2 (two) times daily. Takes with calcium (has combination tablet., Disp: , Rfl:  .  furosemide (LASIX) 40 MG tablet, Take 40 mg by mouth daily as needed for fluid. , Disp: , Rfl:  .  levothyroxine (SYNTHROID, LEVOTHROID) 100 MCG tablet, Take 100 mcg by mouth daily before breakfast. , Disp: , Rfl:  .  magnesium oxide (MAG-OX) 400 MG tablet, Take 400 mg by mouth 2 (two) times daily. Separates from transplant meds. Takes with Ca/Vit D., Disp: , Rfl:  .  mycophenolate (CELLCEPT) 500 MG tablet, Take 1,000 mg by mouth every 12 (twelve) hours., Disp: , Rfl:  .  nystatin (MYCOSTATIN) 100000 UNIT/ML suspension, Take 5 mLs by mouth 4 (four) times daily. Swish and swallow after meals and bedtime. Do not eat or drink for 15 minutes., Disp: , Rfl:  .  pravastatin (PRAVACHOL) 20 MG tablet, Take 20  mg by mouth at bedtime., Disp: , Rfl:  .  predniSONE (DELTASONE) 10 MG tablet, Take 12.5 mg by mouth daily with breakfast., Disp: , Rfl:  .  RABEprazole (ACIPHEX) 20 MG tablet, Take 1 tablet (20 mg total) by mouth 2 (two) times daily., Disp: 180 tablet, Rfl: 3 .  sennosides-docusate sodium (SENOKOT-S) 8.6-50 MG tablet, Take 2 tablets by mouth 2 (two) times daily., Disp: , Rfl:  .  sulfamethoxazole-trimethoprim (BACTRIM,SEPTRA) 400-80 MG tablet, Take 1 tablet by mouth daily., Disp: , Rfl:  .  tacrolimus (PROGRAF) 1 MG capsule, Take 4 mg by mouth every 12 (twelve) hours., Disp: , Rfl:  .  valACYclovir (VALTREX) 500 MG tablet, Take 500 mg by mouth every 12 (twelve) hours., Disp: , Rfl:   Past Medical History: Past Medical History:  Diagnosis Date  . A-fib ([place])   . Asthma    pt. denies  . CHF (congestive heart failure) ([place])   . Depression   . GERD (gastroesophageal reflux disease)   . History of kidney stones   . History of radiation therapy   . Hodgkin's lymphoma ([place]) 1980   IIIB. 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  . Hypothyroidism   . Pacemaker 2011   [place] device/2011  . Pneumonia    recurrent pneumonia sedf.  rad tx for lymphoma  . Shortness of breath dyspnea  with exertion    Tobacco Use: History  Smoking Status  . Never Smoker  Smokeless Tobacco  . Never Used    Labs: Recent Review Flowsheet Data    Labs for ITP Cardiac and Pulmonary Rehab Latest Ref Rng & Units 05/12/2008 04/20/2015 04/20/2015 04/02/2016 04/02/2016   Cholestrol 0 - 200 mg/dL 174 - - - -   LDLCALC 0 - 99 mg/dL 131(H) - - - -   LDLDIRECT mg/dL - - - - -   HDL >39.0 mg/dL 31.1(L) - - - -   Trlycerides 0 - 149 mg/dL 60 - - - -   HCO3 20.0 - 24.0 mEq/L - 25.8(H) 25.7(H) 25.3(H) 25.1(H)   TCO2 0 - 100 mmol/L - 27 27 27 26    ACIDBASEDEF 0.0 - 2.0 mmol/L - - - 1.0 1.0   O2SAT % - 60.0 63.0 53.0 52.0      Capillary Blood Glucose: No results found for:  GLUCAP   Exercise Target Goals:    Exercise Program Goal: Individual exercise prescription set with THRR, safety & activity barriers. Participant demonstrates ability to understand and report RPE using BORG scale, to self-measure pulse accurately, and to acknowledge the importance of the exercise prescription.  Exercise Prescription Goal: Starting with aerobic activity 30 plus minutes a day, 3 days per week for initial exercise prescription. Provide home exercise prescription and guidelines that participant acknowledges understanding prior to discharge.  Activity Barriers & Risk Stratification:     Activity Barriers & Cardiac Risk Stratification - 09/04/16 1639      Activity Barriers & Cardiac Risk Stratification   Activity Barriers Muscular Weakness      6 Minute Walk:     6 Minute Walk    Row Name 09/04/16 1428 09/04/16 1434 09/04/16 1650     6 Minute Walk   Phase Initial Initial Initial   Distance  - 1729 feet  -   Walk Time  - 6 minutes  -   # of Rest Breaks  - 0  -   MPH  - 3.3  -   METS  - 4.9  -   RPE  - 11  -   VO2 Peak  - 17.2  -   Symptoms  - No  -   Resting HR  - 102 bpm  -   Resting BP  - 108/72  -   Max Ex. HR  - 120 bpm  -   Max Ex. BP  - 146/70  -   2 Minute Post BP  - 96/62  -      Initial Exercise Prescription:     Initial Exercise Prescription - 09/04/16 1400      Date of Initial Exercise RX and Referring Provider   Date 09/04/16   Referring Provider Fransico Him MD     Bike   Level 1   Minutes 10   METs 2.68     NuStep   Level 3   Minutes 10   METs 2     Track   Laps 10   Minutes 10   METs 2.74     Prescription Details   Frequency (times per week) 3   Duration Progress to 30 minutes of continuous aerobic without signs/symptoms of physical distress     Intensity   THRR 40-80% of Max Heartrate 66-133   Ratings of Perceived Exertion 11-13   Perceived Dyspnea 0-4     Progression   Progression Continue to progress  workloads to  maintain intensity without signs/symptoms of physical distress.     Resistance Training   Training Prescription Yes   Weight 2lbs   Reps 10-12      Perform Capillary Blood Glucose checks as needed.  Exercise Prescription Changes:     Exercise Prescription Changes    Row Name 09/10/16 1500 09/24/16 1600 10/07/16 1000 11/04/16 1100 12/02/16 1100     Exercise Review   Progression  - Yes Yes Yes Yes     Response to Exercise   Blood Pressure (Admit) 102/70 101/70 98/70 116/80 100/72   Blood Pressure (Exercise) 114/60 126/62 108/62 104/66 98/62   Blood Pressure (Exit) 94/58 104/68 96/56 98/64  102/62   Heart Rate (Admit) 102 bpm 98 bpm 99 bpm 94 bpm 89 bpm   Heart Rate (Exercise) 119 bpm 128 bpm 127 bpm 123 bpm 109 bpm   Heart Rate (Exit) 100 bpm 95 bpm 109 bpm 99 bpm 93 bpm   Rating of Perceived Exertion (Exercise) 16 15 17 15 18   increased tiredness/fatigue due to kidney complications   Symptoms  - none none none increased fatigue and tiredness due to kidney complications   Comments  - Reviewed home exercise guidelines on 09/19/16. Reviewed home exercise guidelines on 09/19/16. Reviewed home exercise guidelines on 09/19/16. Reviewed home exercise guidelines on 09/19/16.   Duration  - Progress to 30 minutes of continuous aerobic without signs/symptoms of physical distress Progress to 30 minutes of continuous aerobic without signs/symptoms of physical distress Progress to 30 minutes of continuous aerobic without signs/symptoms of physical distress Progress to 30 minutes of continuous aerobic without signs/symptoms of physical distress   Intensity  - THRR unchanged THRR unchanged THRR unchanged THRR unchanged     Progression   Progression  - Continue to progress workloads to maintain intensity without signs/symptoms of physical distress. Continue to progress workloads to maintain intensity without signs/symptoms of physical distress. Continue to progress workloads to maintain  intensity without signs/symptoms of physical distress. Continue to progress workloads to maintain intensity without signs/symptoms of physical distress.   Average METs 2.8 3.1 3.7 3.9 3.9     Resistance Training   Training Prescription Yes Yes Yes Yes Yes   Weight 2lbs 5lbs 8lbs 10lbs 10lbs   Reps 10-12 10-12 10-12 10-12 10-12     Bike   Level 2 0.8 1.1 -  -   Minutes 10 10 10  -  -   METs 2.68 2.7 3.35 -  -     NuStep   Level 3 3 3 5 5    Minutes 10 10 10 10 10    METs 2.2 3.8 4.3 4.2 4.2     Track   Laps 14 16 15 15 15    Minutes 10 10 10 10 10    METs 3.44 3.79 3.6 3.6 3.6     Home Exercise Plan   Plans to continue exercise at [place]   Frequency Add 3 additional days to program exercise sessions. Add 3 additional days to program exercise sessions. Add 3 additional days to program exercise sessions. Add 3 additional days to program exercise sessions. Add 3 additional days to program exercise sessions.      Exercise Comments:     Exercise Comments    Row Name 09/10/16 1617 09/19/16 1231 10/07/16 1028 11/04/16 1133 12/02/16 1117   Exercise Comments Pt is off to a good start with exericise. Participant plans to resume walking at home 3 days per week once he completes the cardiac rehab  program. Rreviewed METs and goals. Pt is tolerating exercise well; will continue to monitor exercise progression Rreviewed METs and goals. Pt is tolerating exercise well; will continue to monitor exercise progression Rreviewed METs and goals. Pt is tolerating exercise well; will continue to monitor exercise progression      Discharge Exercise Prescription (Final Exercise Prescription Changes):     Exercise Prescription Changes - 12/02/16 1100      Exercise Review   Progression Yes     Response to Exercise   Blood Pressure (Admit) 100/72   Blood Pressure (Exercise) 98/62   Blood Pressure (Exit) 102/62   Heart Rate (Admit) 89 bpm   Heart Rate (Exercise) 109 bpm   Heart Rate  (Exit) 93 bpm   Rating of Perceived Exertion (Exercise) 18  increased tiredness/fatigue due to kidney complications   Symptoms increased fatigue and tiredness due to kidney complications   Comments Reviewed home exercise guidelines on 09/19/16.   Duration Progress to 30 minutes of continuous aerobic without signs/symptoms of physical distress   Intensity THRR unchanged     Progression   Progression Continue to progress workloads to maintain intensity without signs/symptoms of physical distress.   Average METs 3.9     Resistance Training   Training Prescription Yes   Weight 10lbs   Reps 10-12     NuStep   Level 5   Minutes 10   METs 4.2     Track   Laps 15   Minutes 10   METs 3.6     Home Exercise Plan   Plans to continue exercise at Home   Frequency Add 3 additional days to program exercise sessions.      Nutrition:  Target Goals: Understanding of nutrition guidelines, daily intake of sodium 1500mg , cholesterol 200mg , calories 30% from fat and 7% or less from saturated fats, daily to have 5 or more servings of fruits and vegetables.  Biometrics:     Pre Biometrics - 09/04/16 1444      Pre Biometrics   Waist Circumference 37 inches   Hip Circumference 43.5 inches   Waist to Hip Ratio 0.85 %   Triceps Skinfold 23 mm   % Body Fat 26 %   Grip Strength 37 kg   Flexibility 8.5 in   Single Leg Stand 22.76 seconds       Nutrition Therapy Plan and Nutrition Goals:     Nutrition Therapy & Goals - 09/04/16 1048      Nutrition Therapy   Diet Therapeutic Lifestyle Changes     Intervention Plan   Intervention Prescribe, educate and counsel regarding individualized specific dietary modifications aiming towards targeted core components such as weight, hypertension, lipid management, diabetes, heart failure and other comorbidities.   Expected Outcomes Short Term Goal: Understand basic principles of dietary content, such as calories, fat, sodium, cholesterol and  nutrients.;Long Term Goal: Adherence to prescribed nutrition plan.      Nutrition Discharge: Nutrition Scores:     Nutrition Assessments - 09/04/16 1048      MEDFICTS Scores   Pre Score 7      Nutrition Goals Re-Evaluation:   Psychosocial: Target Goals: Acknowledge presence or absence of depression, maximize coping skills, provide positive support system. Participant is able to verbalize types and ability to use techniques and skills needed for reducing stress and depression.  Initial Review & Psychosocial Screening:     Initial Psych Review & Screening - 09/04/16 1635      Family Dynamics   Good Support  System? Yes     Screening Interventions   Interventions Encouraged to exercise      Quality of Life Scores:     Quality of Life - 09/10/16 0702      Quality of Life Scores   Health/Function Pre 9.13 %   Socioeconomic Pre 21.69 %   Psych/Spiritual Pre 7.07 %   Family Pre 28.8 %   GLOBAL Pre 14.4 %      PHQ-9: Recent Review Flowsheet Data    Depression screen PHQ 2/9 09/08/2016   Decreased Interest 0   Down, Depressed, Hopeless 1   PHQ - 2 Score 1      Psychosocial Evaluation and Intervention:     Psychosocial Evaluation - 12/04/16 1622      Psychosocial Evaluation & Interventions   Interventions Encouraged to exercise with the program and follow exercise prescription   Comments Pt has supportive family and vast knowledge of his disease process and rehabilitiation.   Continued Psychosocial Services Needed No     Discharge Psychosocial Assessment & Intervention   Discharge Continue support measures as needed      Psychosocial Re-Evaluation:     Psychosocial Re-Evaluation    [place] Name 10/09/16 1423 12/04/16 1622           Psychosocial Re-Evaluation   Interventions Encouraged to attend Cardiac Rehabilitation for the exercise Encouraged to attend Cardiac Rehabilitation for the exercise      Comments  - Interacting more with fellow participants          Vocational Rehabilitation: Provide vocational rehab assistance to qualifying candidates.   Vocational Rehab Evaluation & Intervention:     Vocational Rehab - 09/04/16 1639      Initial Vocational Rehab Evaluation & Intervention   Assessment shows need for Vocational Rehabilitation No  Pt does not plan to return to work.      Education: Education Goals: Education classes will be provided on a weekly basis, covering required topics. Participant will state understanding/return demonstration of topics presented.  Learning Barriers/Preferences:     Learning Barriers/Preferences - 09/04/16 0900      Learning Barriers/Preferences   Learning Barriers Sight   Learning Preferences None      Education Topics: Count Your Pulse:  -Group instruction provided by verbal instruction, demonstration, patient participation and written materials to support subject.  Instructors address importance of being able to find your pulse and how to count your pulse when at home without a heart monitor.  Patients get hands on experience counting their pulse with staff help and individually.   Heart Attack, Angina, and Risk Factor Modification:  -Group instruction provided by verbal instruction, video, and written materials to support subject.  Instructors address signs and symptoms of angina and heart attacks.    Also discuss risk factors for heart disease and how to make changes to improve heart health risk factors.   Functional Fitness:  -Group instruction provided by verbal instruction, demonstration, patient participation, and written materials to support subject.  Instructors address safety measures for doing things around the house.  Discuss how to get up and down off the floor, how to pick things up properly, how to safely get out of a chair without assistance, and balance training.   Meditation and Mindfulness:  -Group instruction provided by verbal instruction, patient participation,  and written materials to support subject.  Instructor addresses importance of mindfulness and meditation practice to help reduce stress and improve awareness.  Instructor also leads participants through a meditation exercise.  Stretching for Flexibility and Mobility:  -Group instruction provided by verbal instruction, patient participation, and written materials to support subject.  Instructors lead participants through series of stretches that are designed to increase flexibility thus improving mobility.  These stretches are additional exercise for major muscle groups that are typically performed during regular warm up and cool down. Flowsheet Row CARDIAC REHAB PHASE II EXERCISE from 11/12/2016 in [place]  Date  09/26/16  Educator  Seward Carol  Instruction Review Code  2- meets goals/outcomes      Hands Only CPR Anytime:  -Group instruction provided by verbal instruction, video, patient participation and written materials to support subject.  Instructors co-teach with AHA video for hands only CPR.  Participants get hands on experience with mannequins.   Nutrition I class: Heart Healthy Eating:  -Group instruction provided by PowerPoint slides, verbal discussion, and written materials to support subject matter. The instructor gives an explanation and review of the Therapeutic Lifestyle Changes diet recommendations, which includes a discussion on lipid goals, dietary fat, sodium, fiber, plant stanol/sterol esters, sugar, and the components of a well-balanced, healthy diet.   Nutrition II class: Lifestyle Skills:  -Group instruction provided by PowerPoint slides, verbal discussion, and written materials to support subject matter. The instructor gives an explanation and review of label reading, grocery shopping for heart health, heart healthy recipe modifications, and ways to make healthier choices when eating out.   Diabetes Question & Answer:  -Group  instruction provided by PowerPoint slides, verbal discussion, and written materials to support subject matter. The instructor gives an explanation and review of diabetes co-morbidities, pre- and post-prandial blood glucose goals, pre-exercise blood glucose goals, signs, symptoms, and treatment of hypoglycemia and hyperglycemia, and foot care basics.   Diabetes Blitz:  -Group instruction provided by PowerPoint slides, verbal discussion, and written materials to support subject matter. The instructor gives an explanation and review of the physiology behind type 1 and type 2 diabetes, diabetes medications and rational behind using different medications, pre- and post-prandial blood glucose recommendations and Hemoglobin A1c goals, diabetes diet, and exercise including blood glucose guidelines for exercising safely.    Portion Distortion:  -Group instruction provided by PowerPoint slides, verbal discussion, written materials, and food models to support subject matter. The instructor gives an explanation of serving size versus portion size, changes in portions sizes over the last 20 years, and what consists of a serving from each food group.   Stress Management:  -Group instruction provided by verbal instruction, video, and written materials to support subject matter.  Instructors review role of stress in heart disease and how to cope with stress positively.     Exercising on Your Own:  -Group instruction provided by verbal instruction, power point, and written materials to support subject.  Instructors discuss benefits of exercise, components of exercise, frequency and intensity of exercise, and end points for exercise.  Also discuss use of nitroglycerin and activating EMS.  Review options of places to exercise outside of rehab.  Review guidelines for sex with heart disease.   Cardiac Drugs I:  -Group instruction provided by verbal instruction and written materials to support subject.  Instructor  reviews cardiac drug classes: antiplatelets, anticoagulants, beta blockers, and statins.  Instructor discusses reasons, side effects, and lifestyle considerations for each drug class. Flowsheet Row CARDIAC REHAB PHASE II EXERCISE from 11/12/2016 in [place]  Date  11/12/16  Educator  Pharmacist  Instruction Review Code  2- meets goals/outcomes  Cardiac Drugs II:  -Group instruction provided by verbal instruction and written materials to support subject.  Instructor reviews cardiac drug classes: angiotensin converting enzyme inhibitors (ACE-I), angiotensin II receptor blockers (ARBs), nitrates, and calcium channel blockers.  Instructor discusses reasons, side effects, and lifestyle considerations for each drug class. Flowsheet Row CARDIAC REHAB PHASE II EXERCISE from 11/12/2016 in [place]  Date  10/08/16  Instruction Review Code  2- meets goals/outcomes      Anatomy and Physiology of the Circulatory System:  -Group instruction provided by verbal instruction, video, and written materials to support subject.  Reviews functional anatomy of heart, how it relates to various diagnoses, and what role the heart plays in the overall system.   Knowledge Questionnaire Score:     Knowledge Questionnaire Score - 09/04/16 1431      Knowledge Questionnaire Score   Pre Score 20/24      Core Components/Risk Factors/Patient Goals at Admission:     Personal Goals and Risk Factors at Admission - 09/04/16 1445      Core Components/Risk Factors/Patient Goals on Admission   Heart Failure Yes   Intervention Provide a combined exercise and nutrition program that is supplemented with education, support and counseling about heart failure. Directed toward relieving symptoms such as shortness of breath, decreased exercise tolerance, and extremity edema.   Expected Outcomes Improve functional capacity of life;Short term: Daily weights  obtained and reported for increase. Utilizing diuretic protocols set by physician.;Short term: Attendance in program 2-3 days a week with increased exercise capacity. Reported lower sodium intake. Reported increased fruit and vegetable intake. Reports medication compliance.;Long term: Adoption of self-care skills and reduction of barriers for early signs and symptoms recognition and intervention leading to self-care maintenance.   Personal Goal "To feel as strong as before heart transplant.  Short: improve fatigue/SOB with climbing 2 or more flight of stairs. Long term: get back to lifting weights and `running    Intervention Provide exercise programming to improve overall cardiovascular strength and endurance   Expected Outcomes Pt will be able to increase strength, aerobic capacity, and reduce symptoms of SOB/fatigue.      Core Components/Risk Factors/Patient Goals Review:      Goals and Risk Factor Review    Row Name 09/10/16 1620 10/07/16 1028 11/04/16 1134 12/02/16 1117       Core Components/Risk Factors/Patient Goals Review   Personal Goals Review Other Increase Strength and Stamina;Improve shortness of breath with ADL's;Other  -  -    Review Currently walking 23 minutes 2-3 days/week Pt is interested in doing strength training.  Will send a strength training request to cardiologist. Pt noticed increase fatigue at third station regardless of order/equipment. Received clearance to begin strength training. Pt was oriented to weights and demonstrated good technique.  Pt stated " muscle strength is improving but endurance is still a challenge" Increased fatigue due to kidney failure.    Expected Outcomes To achieve exercise and fitness goals with exercise prescription. Pt will experience less symptoms of fatgue with aerobic exercise and improve in overall cardiovascular fitness Pt will get stronger and increase lean muscle mass Pt will tolerate exercise with little to no signs of physical distress        Core Components/Risk Factors/Patient Goals at Discharge (Final Review):      Goals and Risk Factor Review - 12/02/16 1117      Core Components/Risk Factors/Patient Goals Review   Review Pt stated " muscle strength is improving but  endurance is still a challenge" Increased fatigue due to kidney failure.   Expected Outcomes Pt will tolerate exercise with little to no signs of physical distress      ITP Comments:     ITP Comments    Row Name 09/04/16 0853           ITP Comments Dr. Fransico Him, Medical Director          Comments:  Pt is making expected progress toward personal goals after completing  31 sessions. Pt recently began steroid therapy due to possible signs of rejection with the elevation of Creatine levels.  Pt is taking a mild diuretic. Repeat Psychosocial Assessment: Pt with supportive family, denies any Psychosocial needs or interventions at this time.Recommend continued exercise and life style modification education including  stress management and relaxation techniques to decrease cardiac risk profile. Cherre Huger, BSN

## 2016-12-05 ENCOUNTER — Encounter (HOSPITAL_COMMUNITY)
Admission: RE | Admit: 2016-12-05 | Discharge: 2016-12-05 | Disposition: A | Discharge status: Home / Self Care | Payer: BLUE CROSS/BLUE SHIELD | Source: Ambulatory Visit | Attending: Cardiology | Admitting: Cardiology

## 2016-12-05 DIAGNOSIS — Z941 Heart transplant status: Secondary | ICD-10-CM

## 2016-12-08 ENCOUNTER — Encounter (HOSPITAL_COMMUNITY)
Admission: RE | Admit: 2016-12-08 | Discharge: 2016-12-08 | Disposition: A | Discharge status: Home / Self Care | Payer: BLUE CROSS/BLUE SHIELD | Source: Ambulatory Visit | Attending: Cardiology | Admitting: Cardiology

## 2016-12-08 DIAGNOSIS — Z941 Heart transplant status: Secondary | ICD-10-CM

## 2016-12-10 ENCOUNTER — Encounter (HOSPITAL_COMMUNITY)
Admission: RE | Admit: 2016-12-10 | Discharge: 2016-12-10 | Disposition: A | Discharge status: Home / Self Care | Payer: BLUE CROSS/BLUE SHIELD | Source: Ambulatory Visit | Attending: Cardiology | Admitting: Cardiology

## 2016-12-10 DIAGNOSIS — Z941 Heart transplant status: Secondary | ICD-10-CM

## 2016-12-11 DIAGNOSIS — Z941 Heart transplant status: Secondary | ICD-10-CM | POA: Diagnosis not present

## 2016-12-12 ENCOUNTER — Encounter (HOSPITAL_COMMUNITY)
Admission: RE | Admit: 2016-12-12 | Discharge: 2016-12-12 | Disposition: A | Discharge status: Home / Self Care | Payer: BLUE CROSS/BLUE SHIELD | Source: Ambulatory Visit | Attending: Cardiology | Admitting: Cardiology

## 2016-12-12 DIAGNOSIS — Z941 Heart transplant status: Secondary | ICD-10-CM

## 2016-12-15 ENCOUNTER — Encounter (HOSPITAL_COMMUNITY)
Admission: RE | Admit: 2016-12-15 | Discharge: 2016-12-15 | Disposition: A | Discharge status: Home / Self Care | Payer: BLUE CROSS/BLUE SHIELD | Source: Ambulatory Visit | Attending: Cardiology | Admitting: Cardiology

## 2016-12-15 VITALS — Ht 74.0 in | Wt 200.2 lb

## 2016-12-15 DIAGNOSIS — Z941 Heart transplant status: Secondary | ICD-10-CM | POA: Diagnosis not present

## 2016-12-15 NOTE — Progress Notes (Signed)
Discharge Summary  Patient Details  Name: Glenn Gonzalez MRN: [id number] Date of Birth: Nov 05, 1961 Referring Provider:   Flowsheet Row CARDIAC REHAB PHASE II ORIENTATION from 09/04/2016 in [place]  Referring Provider  Fransico Him MD       Number of Visits: 36  Reason for Discharge:  Patient reached a stable level of exercise. Patient independent in their exercise.  Smoking History:  History  Smoking Status  . Never Smoker  Smokeless Tobacco  . 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 [facility])  ADL UCSD:   Initial Exercise Prescription:     Initial Exercise Prescription - 09/04/16 1400      Date of Initial Exercise RX and Referring Provider   Date 09/04/16   Referring Provider Fransico Him MD     Bike   Level 1   Minutes 10   METs 2.68     NuStep   Level 3   Minutes 10   METs 2     Track   Laps 10   Minutes 10   METs 2.74     Prescription Details   Frequency (times per week) 3   Duration Progress to 30 minutes of continuous aerobic without signs/symptoms of physical distress     Intensity   THRR 40-80% of Max Heartrate 66-133   Ratings of Perceived Exertion 11-13   Perceived Dyspnea 0-4     Progression   Progression Continue to progress workloads to maintain intensity without signs/symptoms of physical distress.     Resistance Training   Training Prescription Yes   Weight 2lbs   Reps 10-12      Discharge Exercise Prescription (Final Exercise Prescription Changes):     Exercise Prescription Changes - 12/22/16 1500      Exercise Review   Progression --     Response to Exercise   Blood Pressure (Admit) 117/69   Blood Pressure (Exercise) 123/74   Blood Pressure (Exit) 100/76   Heart Rate (Admit) 99 bpm   Heart Rate (Exercise) 114 bpm   Heart Rate (Exit) 104 bpm   Rating of Perceived Exertion (Exercise) 15  increased tiredness/fatigue due to kidney complications   Symptoms  increased fatigue and tiredness due to kidney complications   Comments Reviewed home exercise guidelines on 09/19/16.   Duration Progress to 30 minutes of continuous aerobic without signs/symptoms of physical distress   Intensity THRR unchanged     Progression   Progression Continue to progress workloads to maintain intensity without signs/symptoms of physical distress.   Average METs 3.6     Resistance Training   Training Prescription Yes   Weight 10lbs   Reps 10-12     NuStep   Level 5   Minutes 10   METs 4.2     Track   Laps 12   Minutes 10   METs 3.09     Home Exercise Plan   Plans to continue exercise at Home   Frequency Add 3 additional days to program exercise sessions.      Functional Capacity:     6 Minute Walk    Row Name 09/04/16 1428 09/04/16 1434 09/04/16 1650     6 Minute Walk   Phase Initial Initial Initial   Distance  - 1729 feet  -   Walk Time  - 6 minutes  -   # of Rest Breaks  - 0  -   MPH  - 3.3  -  METS  - 4.9  -   RPE  - 11  -   VO2 Peak  - 17.2  -   Symptoms  - No  -   Resting HR  - 102 bpm  -   Resting BP  - 108/72  -   Max Ex. HR  - 120 bpm  -   Max Ex. BP  - 146/70  -   2 Minute Post BP  - 96/62  -   Row Name 12/22/16 1611         6 Minute Walk   Phase Discharge     Distance 1706 feet     Distance % Change 1.33 %     Walk Time 6 minutes     # of Rest Breaks 0     MPH 3.23     METS 4.6     RPE 20     VO2 Peak 16.03     Symptoms Yes (comment)     Comments fatigue     Resting HR 98 bpm     Resting BP 120/70     Max Ex. HR 111 bpm     Max Ex. BP 102/60     2 Minute Post BP 94/68        Psychological, QOL, Others - Outcomes: PHQ 2/9: Depression screen [facility] 2/9 12/15/2016 09/08/2016  Decreased Interest 0 0  Down, Depressed, Hopeless 0 1  PHQ - 2 Score 0 1    Quality of Life:     Quality of Life - 12/22/16 1623      Quality of Life Scores   Health/Function Pre 9.13 %   Health/Function Post 16.83 %    Health/Function % Change 84.34 %   Socioeconomic Pre 21.69 %   Socioeconomic Post 23.06 %   Socioeconomic % Change  6.32 %   Psych/Spiritual Pre 7.07 %   Psych/Spiritual Post 21.5 %   Psych/Spiritual % Change 204.1 %   Family Pre 28.8 %   Family Post 30 %   Family % Change 4.17 %   GLOBAL Pre 14.4 %   GLOBAL Post 21.07 %   GLOBAL % Change 46.32 %      Personal Goals: Goals established at orientation with interventions provided to work toward goal.     Personal Goals and Risk Factors at Admission - 09/04/16 1445      Core Components/Risk Factors/Patient Goals on Admission   Heart Failure Yes   Intervention Provide a combined exercise and nutrition program that is supplemented with education, support and counseling about heart failure. Directed toward relieving symptoms such as shortness of breath, decreased exercise tolerance, and extremity edema.   Expected Outcomes Improve functional capacity of life;Short term: Daily weights obtained and reported for increase. Utilizing diuretic protocols set by physician.;Short term: Attendance in program 2-3 days a week with increased exercise capacity. Reported lower sodium intake. Reported increased fruit and vegetable intake. Reports medication compliance.;Long term: Adoption of self-care skills and reduction of barriers for early signs and symptoms recognition and intervention leading to self-care maintenance.   Personal Goal "To feel as strong as before heart transplant.  Short: improve fatigue/SOB with climbing 2 or more flight of stairs. Long term: get back to lifting weights and `running    Intervention Provide exercise programming to improve overall cardiovascular strength and endurance   Expected Outcomes Pt will be able to increase strength, aerobic capacity, and reduce symptoms of SOB/fatigue.       Personal  Goals Discharge:     Goals and Risk Factor Review    Row Name 09/10/16 1620 10/07/16 1028 11/04/16 1134 12/02/16 1117  12/29/16 1156     Core Components/Risk Factors/Patient Goals Review   Personal Goals Review Other Increase Strength and Stamina;Improve shortness of breath with ADL's;Other  -  - Weight Management/Obesity   Review Currently walking 23 minutes 2-3 days/week Pt is interested in doing strength training.  Will send a strength training request to cardiologist. Pt noticed increase fatigue at third station regardless of order/equipment. Received clearance to begin strength training. Pt was oriented to weights and demonstrated good technique.  Pt stated " muscle strength is improving but endurance is still a challenge" Increased fatigue due to kidney failure. Pt maintained his wt while in CR.    Expected Outcomes To achieve exercise and fitness goals with exercise prescription. Pt will experience less symptoms of fatgue with aerobic exercise and improve in overall cardiovascular fitness Pt will get stronger and increase lean muscle mass Pt will tolerate exercise with little to no signs of physical distress Continue heart healthy diet and lifestyle.      Nutrition & Weight - Outcomes:     Pre Biometrics - 09/04/16 1444      Pre Biometrics   Waist Circumference 37 inches   Hip Circumference 43.5 inches   Waist to Hip Ratio 0.85 %   Triceps Skinfold 23 mm   % Body Fat 26 %   Grip Strength 37 kg   Flexibility 8.5 in   Single Leg Stand 22.76 seconds         Post Biometrics - 12/22/16 1621       Post  Biometrics   Height '6\' 2"'  (1.88 m)   Weight 200 lb 2.8 oz (90.8 kg)   Waist Circumference 37.5 inches   Hip Circumference 43.75 inches   Waist to Hip Ratio 0.86 %   BMI (Calculated) 25.8   Triceps Skinfold 20 mm   % Body Fat 25.8 %   Grip Strength 46 kg   Flexibility 8.5 in   Single Leg Stand 30 seconds      Nutrition:     Nutrition Therapy & Goals - 09/04/16 1048      Nutrition Therapy   Diet Therapeutic Lifestyle Changes     Intervention Plan   Intervention Prescribe, educate  and counsel regarding individualized specific dietary modifications aiming towards targeted core components such as weight, hypertension, lipid management, diabetes, heart failure and other comorbidities.   Expected Outcomes Short Term Goal: Understand basic principles of dietary content, such as calories, fat, sodium, cholesterol and nutrients.;Long Term Goal: Adherence to prescribed nutrition plan.      Nutrition Discharge:     Nutrition Assessments - 12/29/16 1154      MEDFICTS Scores   Pre Score 7   Post Score 7   Score Difference 0      Education Questionnaire Score:     Knowledge Questionnaire Score - 12/22/16 1522      Knowledge Questionnaire Score   Post Score 21/24      Goals reviewed with patient. Pt graduated from cardiac rehab program today with completion of 36 exercise sessions in Phase II. Pt maintained good attendance to exercise.  Pt opted not to participate in the education classes due to his extensive knowledge of his cardiac disease.  Pt made some progres during his participation in rehab as evidenced by increased MET level and the ability to maintain this MET level.  Medication list reconciled. Repeat  PHQ score-1. Pt discouraged at times that he is not as far along with his recovery as he should be. Pt completed post quality of life survey.  Pt scored the following     Quality of Life - 12/22/16 1623      Quality of Life Scores   Health/Function Pre 9.13 %   Health/Function Post 16.83 %   Health/Function % Change 84.34 %   Socioeconomic Pre 21.69 %   Socioeconomic Post 23.06 %   Socioeconomic % Change  6.32 %   Psych/Spiritual Pre 7.07 %   Psych/Spiritual Post 21.5 %   Psych/Spiritual % Change 204.1 %   Family Pre 28.8 %   Family Post 30 %   Family % Change 4.17 %   GLOBAL Pre 14.4 %   GLOBAL Post 21.07 %   GLOBAL % Change 46.32 %      Pt has supportive family and they are fully engaged with his medical care post transplant. Pt with no further  needs or interventions needed.  Pt feels he has made progress toward his goals during cardiac rehab. This is due to the up and down recovery from cardiac transplant.  Pt has better days and then some not as good days.  However this is also improving where he has more better days than not.  Pt has some improvement in fatigue and shortness of breath with walking flights of stairs.  Pt has some of his strength back to prior to transplant.   Pt plans to continue exercise on his own but did reserve the option to participate in cardiac rehab maintenance program. It was a delight to have this patient in the phase II rehab program. Maurice Small RN, BSN

## 2016-12-17 ENCOUNTER — Encounter (HOSPITAL_COMMUNITY): Payer: BLUE CROSS/BLUE SHIELD

## 2016-12-19 ENCOUNTER — Encounter (HOSPITAL_COMMUNITY): Payer: BLUE CROSS/BLUE SHIELD

## 2016-12-22 ENCOUNTER — Encounter (HOSPITAL_COMMUNITY): Payer: BLUE CROSS/BLUE SHIELD

## 2016-12-24 ENCOUNTER — Encounter (HOSPITAL_COMMUNITY): Payer: BLUE CROSS/BLUE SHIELD

## 2016-12-26 ENCOUNTER — Encounter (HOSPITAL_COMMUNITY): Payer: BLUE CROSS/BLUE SHIELD

## 2016-12-29 ENCOUNTER — Encounter (HOSPITAL_COMMUNITY): Payer: BLUE CROSS/BLUE SHIELD

## 2016-12-31 ENCOUNTER — Encounter (HOSPITAL_COMMUNITY): Payer: BLUE CROSS/BLUE SHIELD

## 2017-01-02 ENCOUNTER — Encounter (HOSPITAL_COMMUNITY): Payer: BLUE CROSS/BLUE SHIELD

## 2017-01-05 ENCOUNTER — Encounter (HOSPITAL_COMMUNITY): Payer: BLUE CROSS/BLUE SHIELD

## 2017-01-08 ENCOUNTER — Encounter (HOSPITAL_COMMUNITY): Payer: Self-pay | Admitting: *Deleted

## 2017-01-30 ENCOUNTER — Other Ambulatory Visit: Payer: Self-pay | Admitting: Nephrology

## 2017-01-30 DIAGNOSIS — N183 Chronic kidney disease, stage 3 unspecified: Secondary | ICD-10-CM

## 2017-02-05 ENCOUNTER — Ambulatory Visit
Admission: RE | Admit: 2017-02-05 | Discharge: 2017-02-05 | Disposition: A | Discharge status: Home / Self Care | Payer: Medicare Other | Source: Ambulatory Visit | Attending: Nephrology | Admitting: Nephrology

## 2017-02-05 DIAGNOSIS — N183 Chronic kidney disease, stage 3 unspecified: Secondary | ICD-10-CM

## 2017-02-06 IMAGING — CR DG LUMBAR SPINE 2-3V
1 series · 1 of 1 positions shown · non-contrast
Comparison: CT of the lumbar spine 12/24/2015.

CLINICAL DATA: Right L4-5 laminectomy and microdiscectomy for HNP.

EXAM:
LUMBAR SPINE - 2-3 VIEW

[lat]
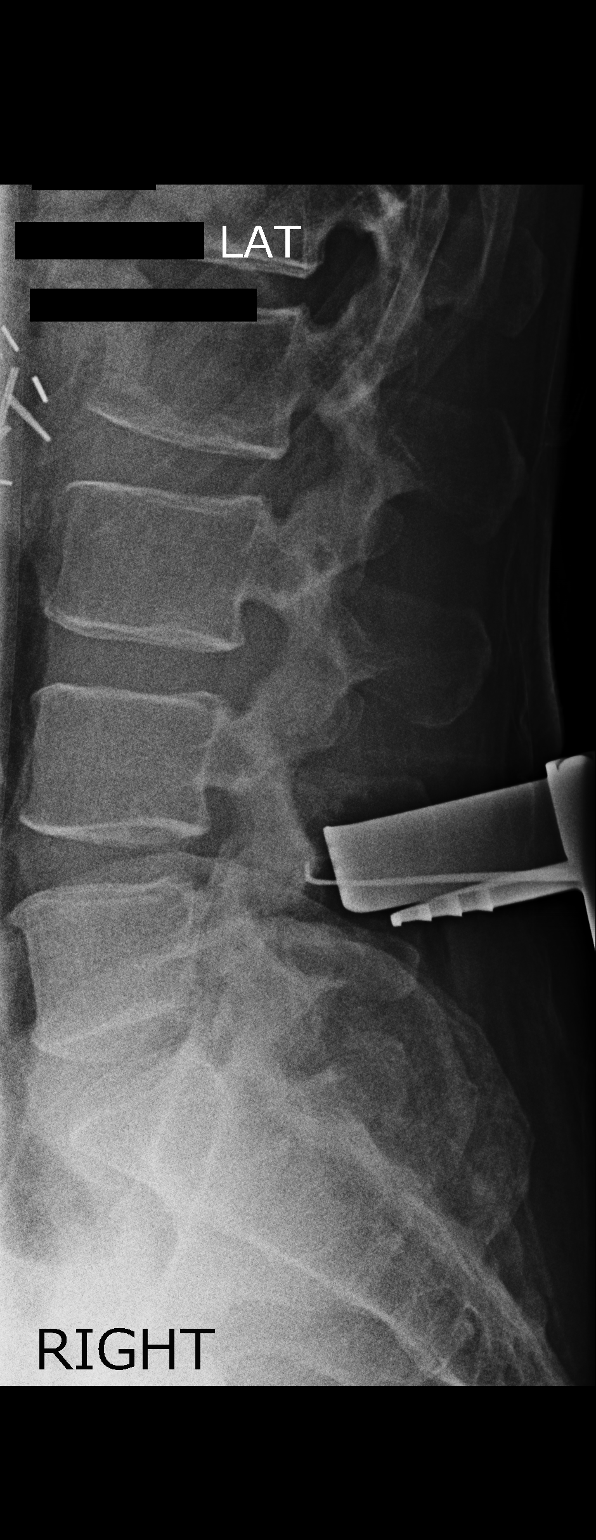

[1 of 1 positions shown; findings below may reference images not displayed]

FINDINGS: The needle is directed at the L4-5 interspace. Atherosclerotic
calcifications are again noted.
IMPRESSION: Intraoperative localization of the L4-5 interspace.

## 2017-03-13 ENCOUNTER — Encounter: Payer: Self-pay | Admitting: Cardiology

## 2017-04-24 ENCOUNTER — Other Ambulatory Visit (HOSPITAL_COMMUNITY): Payer: Self-pay | Admitting: Family Medicine

## 2017-04-24 DIAGNOSIS — Z941 Heart transplant status: Secondary | ICD-10-CM

## 2017-04-30 ENCOUNTER — Other Ambulatory Visit (HOSPITAL_COMMUNITY)
Admission: AD | Admit: 2017-04-30 | Discharge: 2017-04-30 | Disposition: A | Discharge status: Home / Self Care | Payer: BLUE CROSS/BLUE SHIELD | Source: Ambulatory Visit | Attending: Internal Medicine | Admitting: Internal Medicine

## 2017-04-30 DIAGNOSIS — Z941 Heart transplant status: Secondary | ICD-10-CM | POA: Diagnosis present

## 2017-04-30 LAB — BASIC METABOLIC PANEL
ANION GAP: 8 (ref 5–15)
BUN: 45 mg/dL — ABNORMAL HIGH (ref 6–20)
CHLORIDE: 105 mmol/L (ref 101–111)
CO2: 24 mmol/L (ref 22–32)
Calcium: 9 mg/dL (ref 8.9–10.3)
Creatinine, Ser: 1.98 mg/dL — ABNORMAL HIGH (ref 0.61–1.24)
GFR calc non Af Amer: 36 mL/min — ABNORMAL LOW (ref >60)
GFR, EST AFRICAN AMERICAN: 42 mL/min — AB (ref >60)
Glucose, Bld: 111 mg/dL — ABNORMAL HIGH (ref 65–99)
Potassium: 4.4 mmol/L (ref 3.5–5.1)
SODIUM: 137 mmol/L (ref 135–145)

## 2017-05-01 LAB — TACROLIMUS LEVEL: TACROLIMUS (FK506) - LABCORP: 7.4 ng/mL (ref 2.0–20.0)

## 2017-05-12 ENCOUNTER — Ambulatory Visit (HOSPITAL_COMMUNITY)
Admission: RE | Admit: 2017-05-12 | Discharge: 2017-05-12 | Disposition: A | Discharge status: Home / Self Care | Payer: BLUE CROSS/BLUE SHIELD | Source: Ambulatory Visit | Attending: Family Medicine | Admitting: Family Medicine

## 2017-05-12 DIAGNOSIS — I058 Other rheumatic mitral valve diseases: Secondary | ICD-10-CM | POA: Diagnosis not present

## 2017-05-12 DIAGNOSIS — Z941 Heart transplant status: Secondary | ICD-10-CM | POA: Insufficient documentation

## 2017-05-12 DIAGNOSIS — Z48298 Encounter for aftercare following other organ transplant: Secondary | ICD-10-CM | POA: Diagnosis present

## 2017-05-12 NOTE — Progress Notes (Signed)
  Echocardiogram 2D Echocardiogram has been performed.  Glenn Gonzalez 05/12/2017, 12:06 PM

## 2017-05-15 ENCOUNTER — Other Ambulatory Visit: Payer: Self-pay | Admitting: Family Medicine

## 2017-05-15 DIAGNOSIS — Z48298 Encounter for aftercare following other organ transplant: Secondary | ICD-10-CM

## 2017-05-15 DIAGNOSIS — N63 Unspecified lump in unspecified breast: Secondary | ICD-10-CM

## 2017-05-15 DIAGNOSIS — Z87898 Personal history of other specified conditions: Secondary | ICD-10-CM

## 2017-05-15 DIAGNOSIS — Z941 Heart transplant status: Secondary | ICD-10-CM

## 2017-05-15 DIAGNOSIS — N6310 Unspecified lump in the right breast, unspecified quadrant: Secondary | ICD-10-CM

## 2017-05-21 ENCOUNTER — Other Ambulatory Visit: Payer: Self-pay | Admitting: Internal Medicine

## 2017-05-21 DIAGNOSIS — N6310 Unspecified lump in the right breast, unspecified quadrant: Secondary | ICD-10-CM

## 2017-05-24 ENCOUNTER — Other Ambulatory Visit: Payer: BLUE CROSS/BLUE SHIELD

## 2017-05-25 ENCOUNTER — Other Ambulatory Visit: Payer: Self-pay | Admitting: Internal Medicine

## 2017-05-25 DIAGNOSIS — R222 Localized swelling, mass and lump, trunk: Secondary | ICD-10-CM

## 2017-05-25 DIAGNOSIS — Z48298 Encounter for aftercare following other organ transplant: Secondary | ICD-10-CM

## 2017-05-25 DIAGNOSIS — Z941 Heart transplant status: Secondary | ICD-10-CM

## 2017-05-26 ENCOUNTER — Ambulatory Visit
Admission: RE | Admit: 2017-05-26 | Discharge: 2017-05-26 | Disposition: A | Discharge status: Home / Self Care | Payer: BLUE CROSS/BLUE SHIELD | Source: Ambulatory Visit | Attending: Internal Medicine | Admitting: Internal Medicine

## 2017-05-26 DIAGNOSIS — Z941 Heart transplant status: Secondary | ICD-10-CM

## 2017-05-26 DIAGNOSIS — Z48298 Encounter for aftercare following other organ transplant: Secondary | ICD-10-CM

## 2017-05-26 DIAGNOSIS — R222 Localized swelling, mass and lump, trunk: Secondary | ICD-10-CM

## 2017-05-27 ENCOUNTER — Other Ambulatory Visit: Payer: BLUE CROSS/BLUE SHIELD

## 2017-07-08 ENCOUNTER — Other Ambulatory Visit (HOSPITAL_COMMUNITY)
Admission: AD | Admit: 2017-07-08 | Discharge: 2017-07-08 | Disposition: A | Discharge status: Home / Self Care | Payer: BLUE CROSS/BLUE SHIELD | Source: Ambulatory Visit | Attending: Internal Medicine | Admitting: Internal Medicine

## 2017-07-08 DIAGNOSIS — Z029 Encounter for administrative examinations, unspecified: Secondary | ICD-10-CM | POA: Diagnosis not present

## 2017-07-08 LAB — BASIC METABOLIC PANEL
ANION GAP: 9 (ref 5–15)
BUN: 41 mg/dL — ABNORMAL HIGH (ref 6–20)
CALCIUM: 9.2 mg/dL (ref 8.9–10.3)
CO2: 24 mmol/L (ref 22–32)
Chloride: 105 mmol/L (ref 101–111)
Creatinine, Ser: 1.96 mg/dL — ABNORMAL HIGH (ref 0.61–1.24)
GFR, EST AFRICAN AMERICAN: 43 mL/min — AB (ref >60)
GFR, EST NON AFRICAN AMERICAN: 37 mL/min — AB (ref >60)
Glucose, Bld: 98 mg/dL (ref 65–99)
Potassium: 4.5 mmol/L (ref 3.5–5.1)
SODIUM: 138 mmol/L (ref 135–145)

## 2017-07-09 LAB — TACROLIMUS LEVEL: TACROLIMUS (FK506) - LABCORP: 8 ng/mL (ref 2.0–20.0)

## 2017-08-05 ENCOUNTER — Other Ambulatory Visit (HOSPITAL_COMMUNITY)
Admission: AD | Admit: 2017-08-05 | Discharge: 2017-08-05 | Disposition: A | Discharge status: Home / Self Care | Payer: BLUE CROSS/BLUE SHIELD | Source: Ambulatory Visit | Attending: Internal Medicine | Admitting: Internal Medicine

## 2017-08-05 DIAGNOSIS — Z943 Heart and lungs transplant status: Secondary | ICD-10-CM | POA: Diagnosis not present

## 2017-08-05 LAB — BASIC METABOLIC PANEL
ANION GAP: 8 (ref 5–15)
BUN: 30 mg/dL — ABNORMAL HIGH (ref 6–20)
CHLORIDE: 103 mmol/L (ref 101–111)
CO2: 26 mmol/L (ref 22–32)
Calcium: 9.4 mg/dL (ref 8.9–10.3)
Creatinine, Ser: 1.53 mg/dL — ABNORMAL HIGH (ref 0.61–1.24)
GFR calc non Af Amer: 50 mL/min — ABNORMAL LOW (ref >60)
GFR, EST AFRICAN AMERICAN: 57 mL/min — AB (ref >60)
GLUCOSE: 100 mg/dL — AB (ref 65–99)
Potassium: 4.1 mmol/L (ref 3.5–5.1)
Sodium: 137 mmol/L (ref 135–145)

## 2017-08-06 LAB — TACROLIMUS LEVEL: Tacrolimus (FK506) - LabCorp: 7.4 ng/mL (ref 2.0–20.0)

## 2017-09-30 ENCOUNTER — Ambulatory Visit
Admission: RE | Admit: 2017-09-30 | Discharge: 2017-09-30 | Disposition: A | Discharge status: Home / Self Care | Payer: BLUE CROSS/BLUE SHIELD | Source: Ambulatory Visit | Attending: Nephrology | Admitting: Nephrology

## 2017-09-30 ENCOUNTER — Other Ambulatory Visit: Payer: Self-pay | Admitting: Nephrology

## 2017-09-30 DIAGNOSIS — N1330 Unspecified hydronephrosis: Secondary | ICD-10-CM

## 2017-10-13 ENCOUNTER — Other Ambulatory Visit (HOSPITAL_COMMUNITY)
Admission: AD | Admit: 2017-10-13 | Discharge: 2017-10-13 | Disposition: A | Discharge status: Home / Self Care | Payer: BLUE CROSS/BLUE SHIELD | Source: Ambulatory Visit | Attending: Internal Medicine | Admitting: Internal Medicine

## 2017-10-13 DIAGNOSIS — Z943 Heart and lungs transplant status: Secondary | ICD-10-CM | POA: Diagnosis present

## 2017-10-13 LAB — BASIC METABOLIC PANEL
ANION GAP: 8 (ref 5–15)
BUN: 22 mg/dL — ABNORMAL HIGH (ref 6–20)
CHLORIDE: 102 mmol/L (ref 101–111)
CO2: 26 mmol/L (ref 22–32)
Calcium: 9 mg/dL (ref 8.9–10.3)
Creatinine, Ser: 1.42 mg/dL — ABNORMAL HIGH (ref 0.61–1.24)
GFR calc Af Amer: >60 mL/min (ref >60)
GFR, EST NON AFRICAN AMERICAN: 54 mL/min — AB (ref >60)
Glucose, Bld: 97 mg/dL (ref 65–99)
POTASSIUM: 4 mmol/L (ref 3.5–5.1)
SODIUM: 136 mmol/L (ref 135–145)

## 2017-10-14 LAB — TACROLIMUS LEVEL: TACROLIMUS (FK506) - LABCORP: 3.6 ng/mL (ref 2.0–20.0)

## 2017-11-17 ENCOUNTER — Other Ambulatory Visit (HOSPITAL_COMMUNITY)
Admission: AD | Admit: 2017-11-17 | Discharge: 2017-11-17 | Disposition: A | Discharge status: Home / Self Care | Payer: BLUE CROSS/BLUE SHIELD | Source: Ambulatory Visit | Attending: Internal Medicine | Admitting: Internal Medicine

## 2017-11-17 DIAGNOSIS — Z943 Heart and lungs transplant status: Secondary | ICD-10-CM | POA: Insufficient documentation

## 2017-11-17 LAB — BASIC METABOLIC PANEL
Anion gap: 6 (ref 5–15)
BUN: 22 mg/dL — ABNORMAL HIGH (ref 6–20)
CALCIUM: 8.9 mg/dL (ref 8.9–10.3)
CO2: 27 mmol/L (ref 22–32)
CREATININE: 1.36 mg/dL — AB (ref 0.61–1.24)
Chloride: 104 mmol/L (ref 101–111)
GFR calc Af Amer: >60 mL/min (ref >60)
GFR, EST NON AFRICAN AMERICAN: 57 mL/min — AB (ref >60)
GLUCOSE: 96 mg/dL (ref 65–99)
Potassium: 4 mmol/L (ref 3.5–5.1)
Sodium: 137 mmol/L (ref 135–145)

## 2017-11-19 LAB — TACROLIMUS LEVEL: Tacrolimus (FK506) - LabCorp: 7.2 ng/mL (ref 2.0–20.0)

## 2017-12-11 DIAGNOSIS — L570 Actinic keratosis: Secondary | ICD-10-CM | POA: Diagnosis not present

## 2017-12-17 DIAGNOSIS — Z941 Heart transplant status: Secondary | ICD-10-CM | POA: Diagnosis not present

## 2017-12-17 DIAGNOSIS — Z8571 Personal history of Hodgkin lymphoma: Secondary | ICD-10-CM | POA: Diagnosis not present

## 2017-12-17 DIAGNOSIS — D849 Immunodeficiency, unspecified: Secondary | ICD-10-CM | POA: Diagnosis not present

## 2017-12-17 DIAGNOSIS — Z48298 Encounter for aftercare following other organ transplant: Secondary | ICD-10-CM | POA: Diagnosis not present

## 2017-12-31 DIAGNOSIS — D492 Neoplasm of unspecified behavior of bone, soft tissue, and skin: Secondary | ICD-10-CM | POA: Diagnosis not present

## 2017-12-31 DIAGNOSIS — M5126 Other intervertebral disc displacement, lumbar region: Secondary | ICD-10-CM | POA: Diagnosis not present

## 2018-01-01 ENCOUNTER — Other Ambulatory Visit (HOSPITAL_COMMUNITY)
Admission: AD | Admit: 2018-01-01 | Discharge: 2018-01-01 | Disposition: A | Discharge status: Home / Self Care | Payer: 59 | Source: Ambulatory Visit | Attending: Internal Medicine | Admitting: Internal Medicine

## 2018-01-01 DIAGNOSIS — Z941 Heart transplant status: Secondary | ICD-10-CM | POA: Insufficient documentation

## 2018-01-01 LAB — BASIC METABOLIC PANEL
Anion gap: 13 (ref 5–15)
BUN: 27 mg/dL — AB (ref 6–20)
CALCIUM: 9.5 mg/dL (ref 8.9–10.3)
CO2: 25 mmol/L (ref 22–32)
Chloride: 102 mmol/L (ref 101–111)
Creatinine, Ser: 1.53 mg/dL — ABNORMAL HIGH (ref 0.61–1.24)
GFR calc Af Amer: 57 mL/min — ABNORMAL LOW (ref >60)
GFR, EST NON AFRICAN AMERICAN: 49 mL/min — AB (ref >60)
Glucose, Bld: 93 mg/dL (ref 65–99)
POTASSIUM: 4.1 mmol/L (ref 3.5–5.1)
SODIUM: 140 mmol/L (ref 135–145)

## 2018-01-03 LAB — TACROLIMUS LEVEL: TACROLIMUS (FK506) - LABCORP: 4.9 ng/mL (ref 2.0–20.0)

## 2018-01-05 DIAGNOSIS — D492 Neoplasm of unspecified behavior of bone, soft tissue, and skin: Secondary | ICD-10-CM | POA: Diagnosis not present

## 2018-01-25 DIAGNOSIS — Z941 Heart transplant status: Secondary | ICD-10-CM | POA: Diagnosis not present

## 2018-01-25 DIAGNOSIS — Z4821 Encounter for aftercare following heart transplant: Secondary | ICD-10-CM | POA: Diagnosis not present

## 2018-02-10 DIAGNOSIS — Z0000 Encounter for general adult medical examination without abnormal findings: Secondary | ICD-10-CM | POA: Diagnosis not present

## 2018-02-18 DIAGNOSIS — L738 Other specified follicular disorders: Secondary | ICD-10-CM | POA: Diagnosis not present

## 2018-02-18 DIAGNOSIS — L570 Actinic keratosis: Secondary | ICD-10-CM | POA: Diagnosis not present

## 2018-02-18 DIAGNOSIS — L720 Epidermal cyst: Secondary | ICD-10-CM | POA: Diagnosis not present

## 2018-02-18 DIAGNOSIS — Z85828 Personal history of other malignant neoplasm of skin: Secondary | ICD-10-CM | POA: Diagnosis not present

## 2018-02-22 ENCOUNTER — Inpatient Hospital Stay (HOSPITAL_COMMUNITY)
Admission: EM | Admit: 2018-02-22 | Discharge: 2018-02-24 | DRG: 872 | Disposition: A | Discharge status: Home / Self Care | Payer: 59 | Attending: Internal Medicine | Admitting: Internal Medicine

## 2018-02-22 ENCOUNTER — Emergency Department (HOSPITAL_COMMUNITY): Payer: 59

## 2018-02-22 ENCOUNTER — Encounter (HOSPITAL_COMMUNITY): Payer: Self-pay | Admitting: *Deleted

## 2018-02-22 ENCOUNTER — Other Ambulatory Visit: Payer: Self-pay

## 2018-02-22 DIAGNOSIS — Z8249 Family history of ischemic heart disease and other diseases of the circulatory system: Secondary | ICD-10-CM | POA: Diagnosis not present

## 2018-02-22 DIAGNOSIS — N179 Acute kidney failure, unspecified: Secondary | ICD-10-CM | POA: Diagnosis present

## 2018-02-22 DIAGNOSIS — Z79899 Other long term (current) drug therapy: Secondary | ICD-10-CM

## 2018-02-22 DIAGNOSIS — Z7952 Long term (current) use of systemic steroids: Secondary | ICD-10-CM

## 2018-02-22 DIAGNOSIS — Z888 Allergy status to other drugs, medicaments and biological substances status: Secondary | ICD-10-CM

## 2018-02-22 DIAGNOSIS — N3289 Other specified disorders of bladder: Secondary | ICD-10-CM | POA: Diagnosis present

## 2018-02-22 DIAGNOSIS — Z941 Heart transplant status: Secondary | ICD-10-CM

## 2018-02-22 DIAGNOSIS — B9689 Other specified bacterial agents as the cause of diseases classified elsewhere: Secondary | ICD-10-CM | POA: Diagnosis present

## 2018-02-22 DIAGNOSIS — D6489 Other specified anemias: Secondary | ICD-10-CM | POA: Diagnosis present

## 2018-02-22 DIAGNOSIS — R6520 Severe sepsis without septic shock: Secondary | ICD-10-CM | POA: Diagnosis not present

## 2018-02-22 DIAGNOSIS — Z7982 Long term (current) use of aspirin: Secondary | ICD-10-CM

## 2018-02-22 DIAGNOSIS — Z8701 Personal history of pneumonia (recurrent): Secondary | ICD-10-CM

## 2018-02-22 DIAGNOSIS — E860 Dehydration: Secondary | ICD-10-CM | POA: Diagnosis present

## 2018-02-22 DIAGNOSIS — N183 Chronic kidney disease, stage 3 (moderate): Secondary | ICD-10-CM | POA: Diagnosis present

## 2018-02-22 DIAGNOSIS — Z87442 Personal history of urinary calculi: Secondary | ICD-10-CM | POA: Diagnosis not present

## 2018-02-22 DIAGNOSIS — R000 Tachycardia, unspecified: Secondary | ICD-10-CM | POA: Diagnosis not present

## 2018-02-22 DIAGNOSIS — F329 Major depressive disorder, single episode, unspecified: Secondary | ICD-10-CM | POA: Diagnosis present

## 2018-02-22 DIAGNOSIS — E861 Hypovolemia: Secondary | ICD-10-CM | POA: Diagnosis present

## 2018-02-22 DIAGNOSIS — N390 Urinary tract infection, site not specified: Secondary | ICD-10-CM

## 2018-02-22 DIAGNOSIS — Z818 Family history of other mental and behavioral disorders: Secondary | ICD-10-CM

## 2018-02-22 DIAGNOSIS — E039 Hypothyroidism, unspecified: Secondary | ICD-10-CM | POA: Diagnosis present

## 2018-02-22 DIAGNOSIS — Z79890 Hormone replacement therapy (postmenopausal): Secondary | ICD-10-CM | POA: Diagnosis not present

## 2018-02-22 DIAGNOSIS — E739 Lactose intolerance, unspecified: Secondary | ICD-10-CM | POA: Diagnosis present

## 2018-02-22 DIAGNOSIS — Z8571 Personal history of Hodgkin lymphoma: Secondary | ICD-10-CM

## 2018-02-22 DIAGNOSIS — Z923 Personal history of irradiation: Secondary | ICD-10-CM

## 2018-02-22 DIAGNOSIS — N12 Tubulo-interstitial nephritis, not specified as acute or chronic: Secondary | ICD-10-CM | POA: Diagnosis not present

## 2018-02-22 DIAGNOSIS — Z880 Allergy status to penicillin: Secondary | ICD-10-CM

## 2018-02-22 DIAGNOSIS — N200 Calculus of kidney: Secondary | ICD-10-CM | POA: Diagnosis present

## 2018-02-22 DIAGNOSIS — A419 Sepsis, unspecified organism: Secondary | ICD-10-CM | POA: Diagnosis not present

## 2018-02-22 DIAGNOSIS — A4150 Gram-negative sepsis, unspecified: Secondary | ICD-10-CM | POA: Diagnosis not present

## 2018-02-22 DIAGNOSIS — I959 Hypotension, unspecified: Secondary | ICD-10-CM | POA: Diagnosis present

## 2018-02-22 DIAGNOSIS — K219 Gastro-esophageal reflux disease without esophagitis: Secondary | ICD-10-CM | POA: Diagnosis present

## 2018-02-22 DIAGNOSIS — I129 Hypertensive chronic kidney disease with stage 1 through stage 4 chronic kidney disease, or unspecified chronic kidney disease: Secondary | ICD-10-CM | POA: Diagnosis present

## 2018-02-22 DIAGNOSIS — H1131 Conjunctival hemorrhage, right eye: Secondary | ICD-10-CM | POA: Diagnosis present

## 2018-02-22 DIAGNOSIS — N189 Chronic kidney disease, unspecified: Secondary | ICD-10-CM

## 2018-02-22 DIAGNOSIS — R509 Fever, unspecified: Secondary | ICD-10-CM | POA: Diagnosis not present

## 2018-02-22 HISTORY — DX: Heart transplant status: Z94.1

## 2018-02-22 LAB — URINALYSIS, ROUTINE W REFLEX MICROSCOPIC
BILIRUBIN URINE: NEGATIVE
Glucose, UA: NEGATIVE mg/dL
KETONES UR: NEGATIVE mg/dL
Nitrite: NEGATIVE
PH: 5 (ref 5.0–8.0)
Protein, ur: 100 mg/dL — AB
Specific Gravity, Urine: 1.03 (ref 1.005–1.030)

## 2018-02-22 LAB — CBC WITH DIFFERENTIAL/PLATELET
BASOS PCT: 0 %
Band Neutrophils: 18 %
Basophils Absolute: 0 10*3/uL (ref 0.0–0.1)
Blasts: 0 %
EOS ABS: 0 10*3/uL (ref 0.0–0.7)
EOS PCT: 0 %
HCT: 36.6 % — ABNORMAL LOW (ref 39.0–52.0)
HEMOGLOBIN: 11.5 g/dL — AB (ref 13.0–17.0)
LYMPHS PCT: 2 %
Lymphs Abs: 0.3 10*3/uL — ABNORMAL LOW (ref 0.7–4.0)
MCH: 26.9 pg (ref 26.0–34.0)
MCHC: 31.4 g/dL (ref 30.0–36.0)
MCV: 85.7 fL (ref 78.0–100.0)
MONOS PCT: 3 %
Metamyelocytes Relative: 0 %
Monocytes Absolute: 0.5 10*3/uL (ref 0.1–1.0)
Myelocytes: 0 %
NEUTROS ABS: 15.8 10*3/uL — AB (ref 1.7–7.7)
NEUTROS PCT: 77 %
NRBC: 0 /100{WBCs}
OTHER: 0 %
Platelets: 505 10*3/uL — ABNORMAL HIGH (ref 150–400)
Promyelocytes Absolute: 0 %
RBC: 4.27 MIL/uL (ref 4.22–5.81)
RDW: 16.4 % — ABNORMAL HIGH (ref 11.5–15.5)
WBC: 16.6 10*3/uL — ABNORMAL HIGH (ref 4.0–10.5)

## 2018-02-22 LAB — TSH: TSH: 1.062 u[IU]/mL (ref 0.350–4.500)

## 2018-02-22 LAB — COMPREHENSIVE METABOLIC PANEL
ALBUMIN: 3.2 g/dL — AB (ref 3.5–5.0)
ALT: 16 U/L — ABNORMAL LOW (ref 17–63)
AST: 26 U/L (ref 15–41)
Alkaline Phosphatase: 69 U/L (ref 38–126)
Anion gap: 12 (ref 5–15)
BILIRUBIN TOTAL: 1.7 mg/dL — AB (ref 0.3–1.2)
BUN: 23 mg/dL — AB (ref 6–20)
CHLORIDE: 102 mmol/L (ref 101–111)
CO2: 19 mmol/L — AB (ref 22–32)
Calcium: 8.5 mg/dL — ABNORMAL LOW (ref 8.9–10.3)
Creatinine, Ser: 1.86 mg/dL — ABNORMAL HIGH (ref 0.61–1.24)
GFR calc Af Amer: 45 mL/min — ABNORMAL LOW (ref >60)
GFR calc non Af Amer: 39 mL/min — ABNORMAL LOW (ref >60)
GLUCOSE: 96 mg/dL (ref 65–99)
POTASSIUM: 4.3 mmol/L (ref 3.5–5.1)
SODIUM: 133 mmol/L — AB (ref 135–145)
Total Protein: 5.7 g/dL — ABNORMAL LOW (ref 6.5–8.1)

## 2018-02-22 LAB — PROCALCITONIN: Procalcitonin: 6.39 ng/mL

## 2018-02-22 LAB — T4, FREE: FREE T4: 1.2 ng/dL — AB (ref 0.61–1.12)

## 2018-02-22 LAB — PROTIME-INR
INR: 1.19
Prothrombin Time: 15 seconds (ref 11.4–15.2)

## 2018-02-22 LAB — I-STAT CG4 LACTIC ACID, ED: Lactic Acid, Venous: 1.54 mmol/L (ref 0.5–1.9)

## 2018-02-22 MED ORDER — OXYBUTYNIN CHLORIDE ER 5 MG PO TB24
5.0000 mg | ORAL_TABLET | Freq: Every day | ORAL | Status: DC
  Administered 2018-02-22 – 2018-02-24 (×2): 5 mg via ORAL
  Filled 2018-02-22 (×3): qty 1

## 2018-02-22 MED ORDER — PANTOPRAZOLE SODIUM 40 MG PO TBEC
40.0000 mg | DELAYED_RELEASE_TABLET | Freq: Two times a day (BID) | ORAL | Status: DC
  Administered 2018-02-22 – 2018-02-23 (×2): 40 mg via ORAL
  Filled 2018-02-22 (×2): qty 1

## 2018-02-22 MED ORDER — PREDNISONE 5 MG PO TABS
2.5000 mg | ORAL_TABLET | Freq: Every day | ORAL | Status: DC
  Administered 2018-02-23: 2.5 mg via ORAL
  Filled 2018-02-22 (×2): qty 1

## 2018-02-22 MED ORDER — ASPIRIN EC 81 MG PO TBEC
81.0000 mg | DELAYED_RELEASE_TABLET | Freq: Every day | ORAL | Status: DC
  Administered 2018-02-23 – 2018-02-24 (×2): 81 mg via ORAL
  Filled 2018-02-22 (×2): qty 1

## 2018-02-22 MED ORDER — MYCOPHENOLATE MOFETIL 250 MG PO CAPS
1000.0000 mg | ORAL_CAPSULE | Freq: Two times a day (BID) | ORAL | Status: DC
  Administered 2018-02-22 – 2018-02-23 (×2): 1000 mg via ORAL
  Filled 2018-02-22 (×3): qty 4

## 2018-02-22 MED ORDER — CETIRIZINE HCL 10 MG PO TABS
10.0000 mg | ORAL_TABLET | Freq: Every day | ORAL | Status: DC
  Administered 2018-02-22 – 2018-02-23 (×2): 10 mg via ORAL
  Filled 2018-02-22 (×2): qty 1

## 2018-02-22 MED ORDER — ACETAMINOPHEN 650 MG RE SUPP: 650.0000 mg | Freq: Four times a day (QID) | RECTAL | Status: DC | PRN

## 2018-02-22 MED ORDER — CHOLECALCIFEROL 10 MCG (400 UNIT) PO TABS
400.0000 [IU] | ORAL_TABLET | Freq: Two times a day (BID) | ORAL | Status: DC
  Filled 2018-02-22: qty 1

## 2018-02-22 MED ORDER — SODIUM CHLORIDE 0.9 % IV BOLUS (SEPSIS)
1000.0000 mL | Freq: Once | INTRAVENOUS | Status: AC
  Administered 2018-02-22: 1000 mL via INTRAVENOUS

## 2018-02-22 MED ORDER — SODIUM CHLORIDE 0.9 % IV SOLN
2.0000 g | Freq: Once | INTRAVENOUS | Status: AC
  Administered 2018-02-22: 2 g via INTRAVENOUS
  Filled 2018-02-22: qty 2

## 2018-02-22 MED ORDER — DOCUSATE SODIUM 100 MG PO CAPS
100.0000 mg | ORAL_CAPSULE | Freq: Two times a day (BID) | ORAL | Status: DC
  Administered 2018-02-22 – 2018-02-23 (×3): 100 mg via ORAL
  Filled 2018-02-22 (×3): qty 1

## 2018-02-22 MED ORDER — MAGNESIUM OXIDE 400 (241.3 MG) MG PO TABS
400.0000 mg | ORAL_TABLET | Freq: Two times a day (BID) | ORAL | Status: DC
  Filled 2018-02-22: qty 1

## 2018-02-22 MED ORDER — VANCOMYCIN HCL IN DEXTROSE 1-5 GM/200ML-% IV SOLN
1000.0000 mg | Freq: Once | INTRAVENOUS | Status: AC
  Administered 2018-02-22: 1000 mg via INTRAVENOUS
  Filled 2018-02-22: qty 200

## 2018-02-22 MED ORDER — LEVOTHYROXINE SODIUM 25 MCG PO TABS
125.0000 ug | ORAL_TABLET | ORAL | Status: DC
  Administered 2018-02-23 – 2018-02-24 (×2): 125 ug via ORAL
  Filled 2018-02-22 (×3): qty 1

## 2018-02-22 MED ORDER — ONDANSETRON HCL 4 MG PO TABS: 4.0000 mg | ORAL_TABLET | Freq: Four times a day (QID) | ORAL | Status: DC | PRN

## 2018-02-22 MED ORDER — POLYETHYLENE GLYCOL 3350 17 G PO PACK
17.0000 g | PACK | Freq: Every day | ORAL | Status: DC | PRN
  Administered 2018-02-23 – 2018-02-24 (×2): 17 g via ORAL
  Filled 2018-02-22 (×2): qty 1

## 2018-02-22 MED ORDER — MAGNESIUM OXIDE 400 (241.3 MG) MG PO TABS: 400.0000 mg | ORAL_TABLET | Freq: Two times a day (BID) | ORAL | Status: DC

## 2018-02-22 MED ORDER — AZTREONAM 1 G IJ SOLR
1.0000 g | Freq: Three times a day (TID) | INTRAMUSCULAR | Status: DC
  Filled 2018-02-22: qty 1

## 2018-02-22 MED ORDER — ACETAMINOPHEN 500 MG PO TABS
1000.0000 mg | ORAL_TABLET | Freq: Every day | ORAL | Status: DC
  Administered 2018-02-22 – 2018-02-23 (×2): 1000 mg via ORAL
  Filled 2018-02-22 (×2): qty 2

## 2018-02-22 MED ORDER — ONDANSETRON HCL 4 MG/2ML IJ SOLN: 4.0000 mg | Freq: Four times a day (QID) | INTRAMUSCULAR | Status: DC | PRN

## 2018-02-22 MED ORDER — MAGNESIUM OXIDE 400 (241.3 MG) MG PO TABS
400.0000 mg | ORAL_TABLET | Freq: Two times a day (BID) | ORAL | Status: DC
  Administered 2018-02-23 (×2): 400 mg via ORAL
  Filled 2018-02-22: qty 1

## 2018-02-22 MED ORDER — TACROLIMUS 1 MG PO CAPS
2.0000 mg | ORAL_CAPSULE | Freq: Every day | ORAL | Status: DC
  Filled 2018-02-22: qty 2

## 2018-02-22 MED ORDER — ACETAMINOPHEN 325 MG PO TABS: 650.0000 mg | ORAL_TABLET | Freq: Four times a day (QID) | ORAL | Status: DC | PRN

## 2018-02-22 MED ORDER — AZTREONAM 1 G IJ SOLR
1.0000 g | Freq: Three times a day (TID) | INTRAMUSCULAR | Status: DC
Start: 2018-02-22 — End: 2018-02-24
  Administered 2018-02-22 – 2018-02-24 (×6): 1 g via INTRAVENOUS
  Filled 2018-02-22 (×8): qty 1

## 2018-02-22 MED ORDER — VANCOMYCIN HCL IN DEXTROSE 750-5 MG/150ML-% IV SOLN
750.0000 mg | Freq: Two times a day (BID) | INTRAVENOUS | Status: DC
  Filled 2018-02-22: qty 150

## 2018-02-22 MED ORDER — ALLOPURINOL 100 MG PO TABS
100.0000 mg | ORAL_TABLET | Freq: Every day | ORAL | Status: DC
  Administered 2018-02-23 – 2018-02-24 (×2): 100 mg via ORAL
  Filled 2018-02-22 (×3): qty 1

## 2018-02-22 MED ORDER — LORATADINE 10 MG PO TABS: 10.0000 mg | ORAL_TABLET | Freq: Every day | ORAL | Status: DC

## 2018-02-22 MED ORDER — LEVOFLOXACIN IN D5W 750 MG/150ML IV SOLN
750.0000 mg | INTRAVENOUS | Status: DC
  Administered 2018-02-23 – 2018-02-24 (×2): 750 mg via INTRAVENOUS
  Filled 2018-02-22 (×3): qty 150

## 2018-02-22 MED ORDER — CALCIUM CARBONATE 1500 (600 CA) MG PO TABS
1500.0000 mg | ORAL_TABLET | Freq: Two times a day (BID) | ORAL | Status: DC
  Filled 2018-02-22 (×3): qty 1

## 2018-02-22 MED ORDER — ENOXAPARIN SODIUM 40 MG/0.4ML ~~LOC~~ SOLN
40.0000 mg | SUBCUTANEOUS | Status: DC
  Filled 2018-02-22 (×3): qty 0.4

## 2018-02-22 MED ORDER — SODIUM CHLORIDE 0.9 % IV BOLUS (SEPSIS): 1000.0000 mL | Freq: Once | INTRAVENOUS | Status: DC

## 2018-02-22 MED ORDER — LEVOFLOXACIN IN D5W 750 MG/150ML IV SOLN
750.0000 mg | Freq: Once | INTRAVENOUS | Status: AC
  Administered 2018-02-22: 750 mg via INTRAVENOUS
  Filled 2018-02-22: qty 150

## 2018-02-22 MED ORDER — SODIUM CHLORIDE 0.9% FLUSH
3.0000 mL | Freq: Two times a day (BID) | INTRAVENOUS | Status: DC
  Administered 2018-02-22 – 2018-02-24 (×4): 3 mL via INTRAVENOUS

## 2018-02-22 MED ORDER — CHOLECALCIFEROL 10 MCG (400 UNIT) PO TABS
400.0000 [IU] | ORAL_TABLET | Freq: Two times a day (BID) | ORAL | Status: DC
  Administered 2018-02-23 (×2): 400 [IU] via ORAL
  Filled 2018-02-22 (×4): qty 1

## 2018-02-22 MED ORDER — LACTATED RINGERS IV SOLN
INTRAVENOUS | Status: DC
  Administered 2018-02-22 – 2018-02-23 (×2): via INTRAVENOUS

## 2018-02-22 NOTE — ED Triage Notes (Signed)
PT states fever, bladder spasms, and hematuria since yesterday.  Last night fever of 103.5.  Hx of heart transplant in 2017.  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  BP 70/54.

## 2018-02-22 NOTE — ED Provider Notes (Signed)
[place] EMERGENCY DEPARTMENT Provider Note   CSN: [id number] Arrival date & time: 02/22/18  6433     History   Chief Complaint Chief Complaint  Patient presents with  . Fever  . Hematuria    HPI Glenn Gonzalez is a 57 y.o. male.  HPI  The patient is a 57 year old male with a known history of congestive heart failure, required a pacemaker and ultimately underwent a heart transplant a couple years ago, he has done very well since that time.  He had congestive heart failure secondary to the chemotherapy that he underwent for his Hodgkin's lymphoma which has been in remission for quite some time.  The patient reports that he started having thick green drainage from his nose a couple of weeks ago, ultimately this went away but when it came back he was prescribed Zithromax which she is currently taking, feels as though his symptoms are improving.  He has noticed some increased bladder spasm and hematuria over the last 24 hours and has a history of multiple kidney stones most of which she passes spontaneously due to their small size.  He has no abdominal pain, back pain, swelling of the legs or rashes.  He does report having some coughing and mild shortness of breath, on arrival his blood pressure was 70 m of mercury systolic however the patient states that in the morning his blood pressure is usually around 90 systolic.  He is diagnosed with hypertension but because of his heart transplant he normally has hypertension in the evening and hypotension in the morning and that is normal for him.  The only new medications that he is taking is Ditropan for his bladder spasm and Zithromax.  He does report that he had a fever as high as 103.5 last night and when he talked to his transplant team they recommended that he come to the hospital for evaluation.  His primary [facility]ology team [facility] at [facility]  Past Medical History:  Diagnosis Date  . A-fib ([place])   . Asthma    pt.  denies  . CHF (congestive heart failure) ([place])   . Depression   . GERD (gastroesophageal reflux disease)   . History of kidney stones   . History of radiation therapy   . Hodgkin's lymphoma ([place]) 1980   IIIB. 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  . Hypothyroidism   . Pacemaker 2011   [place] device/2011  . Pneumonia    recurrent pneumonia sedf.  rad tx for lymphoma  . Shortness of breath dyspnea    with exertion    Patient Active Problem List   Diagnosis Date Noted  . Atrial fibrillation ([place]) [I48.91] 05/15/2016  . S/P lumbar laminectomy 01/04/2016  . Palpitations 04/11/2015  . Dyspnea 01/02/2015  . Atrial tachycardia ([place]) 09/19/2014  . Fatigue 08/10/2014  . Hypotension 10/23/2013  . Insomnia 08/04/2013  . Chronotropic incompetence 05/31/2013  . Biventricular implantable cardioverter-defibrillator BSx 05/31/2013  . Nonischemic cardiomyopathy ([place]) 10/10/2011  . SHORTNESS OF BREATH 06/20/2010  . VENTRICULAR TACHYCARDIA 06/04/2010  . SYSTOLIC HEART FAILURE, CHRONIC 06/04/2010  . DEPRESSION 03/15/2008  . VERTIGO, BENIGN PAROXYSMAL POSITION 09/23/2007  . NEOPLASM, BENIGN, SKIN, TRUNK 08/26/2007  . VIRAL URI 08/14/2007    Past Surgical History:  Procedure Laterality Date  . CARDIAC CATHETERIZATION N/A 04/20/2015   Procedure: Right/Left Heart Cath and Coronary Angiography;  Surgeon: Jolaine Artist, MD;  Location: [place] CV LAB;  Service: Cardiovascular;  Laterality: N/A;  . CARDIAC CATHETERIZATION  10-11-15   pt states routine heart cath to be done at [facility]   . CARDIAC CATHETERIZATION N/A 04/02/2016   Procedure: Right Heart Cath;  Surgeon: Jolaine Artist, MD;  Location: [place] CV LAB;  Service: Cardiovascular;  Laterality: N/A;  . COLONOSCOPY WITH PROPOFOL N/A 08/03/2015   Procedure: COLONOSCOPY WITH PROPOFOL;  Surgeon: Carol Ada, MD;  Location: [facility] ENDOSCOPY;  Service: Endoscopy;  Laterality: N/A;  wants to try to do  procedure without sedation  . CYSTOSCOPY WITH RETROGRADE PYELOGRAM, URETEROSCOPY AND STENT PLACEMENT Right 10/12/2015   Procedure: CYSTOSCOPY WITH RETROGRADE PYELOGRAM, URETEROSCOPY AND STENT PLACEMENT;  Surgeon: Cleon Gustin, MD;  Location: [facility] ORS;  Service: Urology;  Laterality: Right;  . ESOPHAGOGASTRODUODENOSCOPY    . EXPLORATORY LAPAROTOMY    . gynemastia Bilateral   . HEART TRANSPLANT  06/20/2016   at [facility]  . LUMBAR LAMINECTOMY/DECOMPRESSION MICRODISCECTOMY Right 01/04/2016   Procedure: Microdiscectomy Lumbar four Lumbar five right;  Surgeon: Eustace Moore, MD;  Location: [place] NEURO ORS;  Service: Neurosurgery;  Laterality: Right;  . neck biopses     x5  . permanent pacemaker  2011   [employer] COGNIS device  . PORTACATH PLACEMENT     06-29-15 inserted, now removed.  Marland Kitchen STONE EXTRACTION WITH BASKET Right 10/12/2015   Procedure: STONE EXTRACTION WITH BASKET;  Surgeon: Cleon Gustin, MD;  Location: [facility] ORS;  Service: Urology;  Laterality: Right;  . TONSILLECTOMY          Home Medications    Prior to Admission medications   Medication Sig Start Date End Date Taking? Authorizing Provider  acetaminophen (TYLENOL) 650 MG CR tablet Take 1,300 mg by mouth at bedtime.    Yes [provider]  allopurinol (ZYLOPRIM) 100 MG tablet Take 100 mg by mouth daily. 01/04/18  Yes [provider]  aspirin EC 81 MG tablet Take 81 mg by mouth daily.   Yes [provider]  calcium carbonate (OSCAL) 1500 (600 Ca) MG TABS tablet Take 1,500 mg by mouth 2 (two) times daily with a meal. Separated from transplant medications.   Yes [provider]  cetirizine (ZYRTEC) 10 MG tablet Take 10 mg by mouth at bedtime.    Yes [provider]  cholecalciferol (VITAMIN D) 400 units TABS tablet Take 400 Units by mouth 2 (two) times daily. Takes with calcium (has combination tablet.   Yes [provider]  ENVARSUS XR 1 MG TB24 Take 2 mg by mouth daily.  12/22/17  Yes [provider]  fluticasone (FLONASE) 50 MCG/ACT nasal spray Place 1 spray into both nostrils as needed for allergies or rhinitis.   Yes [provider]  furosemide (LASIX) 40 MG tablet Take 20 mg by mouth daily as needed for fluid. As needed based upon weight and fluid status   Yes [provider]  levothyroxine (SYNTHROID, LEVOTHROID) 125 MCG tablet Take 125 mcg by mouth every morning. 01/09/16  Yes [provider]  magnesium oxide (MAG-OX) 400 MG tablet Take 400 mg by mouth 2 (two) times daily. Separates from transplant meds. Takes with Ca/Vit D.   Yes [provider]  mycophenolate (CELLCEPT) 500 MG tablet Take 1,000 mg by mouth every 12 (twelve) hours.   Yes [provider]  oxybutynin (DITROPAN-XL) 5 MG 24 hr tablet Take 5 mg by mouth daily. 02/21/18 02/21/19 Yes [provider]  oxymetazoline (AFRIN) 0.05 % nasal spray Place 1 spray into both nostrils as  needed for congestion.   Yes [provider]  polyethylene glycol (MIRALAX / GLYCOLAX) packet Take 17 g by mouth daily as needed for mild constipation.   Yes [provider]  predniSONE (DELTASONE) 10 MG tablet Take 12.5 mg by mouth daily with breakfast. 12/15/16 pt presently taking 5 mg then decrease to 2.5 mg on the 21 st.   Yes [provider]  RABEprazole (ACIPHEX) 20 MG tablet Take 1 tablet (20 mg total) by mouth 2 (two) times daily. 03/07/11  Yes Bensimhon, Shaune Pascal, MD  valACYclovir (VALTREX) 500 MG tablet Take 500 mg by mouth every 12 (twelve) hours.   Yes [provider]    Family History Family History  Problem Relation Age of Onset  . Hyperthyroidism Mother   . Insomnia Mother   . Hypertension Father   . Depression Father   . Insomnia Father     Social History Social History   Tobacco Use  . Smoking status: Never Smoker  . Smokeless tobacco: Never Used  Substance Use Topics  . Alcohol use: No  . Drug use: No      Allergies   Digoxin; Phencyclidine; Spironolactone; Lactose intolerance (gi); Milk-related compounds; and Penicillins   Review of Systems Review of Systems  All other systems reviewed and are negative.    Physical Exam Updated Vital Signs BP 92/61   Pulse 97   Temp 99.5 F (37.5 C) (Oral)   Resp (!) 25   Ht 6\' 2"  (1.88 m)   Wt 98.9 kg (218 lb)   SpO2 97%   BMI 27.99 kg/m   Physical Exam  Constitutional: He appears well-developed and well-nourished. No distress.  HENT:  Head: Normocephalic and atraumatic.  Mouth/Throat: Oropharynx is clear and moist. No oropharyngeal exudate.  The oropharynx is clear and moist, phonation is normal, there is no exudate asymmetry or hypertrophy of the tonsils  Eyes: Pupils are equal, round, and reactive to light. Conjunctivae and EOM are normal. Right eye exhibits no discharge. Left eye exhibits no discharge. No scleral icterus.  He has a subconjunctival hemorrhage in the medial right conjunctive of the right eye, there is no abnormalities to the pupils, no discharge or periorbital swelling  Neck: Normal range of motion. Neck supple. No JVD present. No thyromegaly present.  Very supple neck, no lymphadenopathy, no trismus or torticollis, no thyromegaly  Cardiovascular: Normal rate, regular rhythm, normal heart sounds and intact distal pulses. Exam reveals no gallop and no friction rub.  No murmur heard. Heart rate is close to 90, normal pulses at the radial arteries, no edema, no JVD  Pulmonary/Chest: Effort normal and breath sounds normal. No respiratory distress. He has no wheezes. He has no rales.  Lungs are clear bilaterally without wheezing rhonchi or rales.  Speaks in full sentences with no increased work of breathing  Abdominal: Soft. Bowel sounds are normal. He exhibits no distension and no mass. There is no tenderness.  Minimal tenderness in the suprapubic region which he states gives him a "bladder spasm", there is no other  abdominal tenderness  Musculoskeletal: Normal range of motion. He exhibits no edema or tenderness.  Lymphadenopathy:    He has no cervical adenopathy.  Neurological: He is alert. Coordination normal.  Skin: Skin is warm and dry. No rash noted. No erythema.  Psychiatric: He has a normal mood and affect. His behavior is normal.  Nursing note and vitals reviewed.    ED Treatments / Results  Labs (all labs ordered are listed, but  only abnormal results are displayed) Labs Reviewed  COMPREHENSIVE METABOLIC PANEL - Abnormal; Notable for the following components:      Result Value   Sodium 133 (*)    CO2 19 (*)    BUN 23 (*)    Creatinine, Ser 1.86 (*)    Calcium 8.5 (*)    Total Protein 5.7 (*)    Albumin 3.2 (*)    ALT 16 (*)    Total Bilirubin 1.7 (*)    GFR calc non Af Amer 39 (*)    GFR calc Af Amer 45 (*)    All other components within normal limits  CBC WITH DIFFERENTIAL/PLATELET - Abnormal; Notable for the following components:   WBC 16.6 (*)    Hemoglobin 11.5 (*)    HCT 36.6 (*)    RDW 16.4 (*)    Platelets 505 (*)    Neutro Abs 15.8 (*)    Lymphs Abs 0.3 (*)    All other components within normal limits  URINALYSIS, ROUTINE W REFLEX MICROSCOPIC - Abnormal; Notable for the following components:   Color, Urine AMBER (*)    APPearance TURBID (*)    Hgb urine dipstick LARGE (*)    Protein, ur 100 (*)    Leukocytes, UA MODERATE (*)    Bacteria, UA MANY (*)    Squamous Epithelial / LPF 0-5 (*)    Non Squamous Epithelial 6-30 (*)    All other components within normal limits  CULTURE, BLOOD (ROUTINE X 2)  CULTURE, BLOOD (ROUTINE X 2)  URINE CULTURE  PROTIME-INR  I-STAT CG4 LACTIC ACID, ED  I-STAT CG4 LACTIC ACID, ED    EKG EKG Interpretation  Date/Time:  Monday February 22 2018 09:56:07 EDT Ventricular Rate:  103 PR Interval:  128 QRS Duration: 96 QT Interval:  320 QTC Calculation: 419 R Axis:   121 Text Interpretation:  Sinus tachycardia Left atrial  enlargement Right axis deviation Incomplete right bundle branch block Right ventricular hypertrophy Abnormal ECG Since last tracing pacing not seen Confirmed by Noemi Chapel [phone]) on 02/22/2018 10:13:12 AM Also confirmed by Noemi Chapel [phone]), editor Lynder Parents [phone])  on 02/22/2018 10:46:53 AM   Radiology Dg Chest Portable 1 View  Result Date: 02/22/2018 CLINICAL DATA:  Fevers.  History of heart transplant. EXAM: PORTABLE CHEST 1 VIEW COMPARISON:  07/31/2015 FINDINGS: Previous median sternotomy. Normal heart size. No pleural effusion or edema. No airspace opacities identified. IMPRESSION: 1. No acute cardiopulmonary abnormalities. Electronically Signed   By: Kerby Moors M.D.   On: 02/22/2018 10:47    Procedures .Critical Care Performed by: Noemi Chapel, MD Authorized by: Noemi Chapel, MD   Critical care provider statement:    Critical care time (minutes):  35   Critical care time was exclusive of:  Separately billable procedures and treating other patients and teaching time   Critical care was necessary to treat or prevent imminent or life-threatening deterioration of the following conditions:  Shock and sepsis   Critical care was time spent personally by me on the following activities:  Blood draw for specimens, development of treatment plan with patient or surrogate, discussions with consultants, evaluation of patient's response to treatment, examination of patient, obtaining history from patient or surrogate, ordering and performing treatments and interventions, ordering and review of laboratory studies, ordering and review of radiographic studies, pulse oximetry, re-evaluation of patient's condition and review of old charts   (including critical care time)  Medications Ordered in ED Medications  sodium chloride 0.9 % bolus  1,000 mL (0 mLs Intravenous Stopped 02/22/18 1052)    And  sodium chloride 0.9 % bolus 1,000 mL (0 mLs Intravenous Hold 02/22/18 1056)    And  sodium  chloride 0.9 % bolus 1,000 mL (1,000 mLs Intravenous New Bag/Given 02/22/18 1314)  levofloxacin (LEVAQUIN) IVPB 750 mg (0 mg Intravenous Stopped 02/22/18 1250)  aztreonam (AZACTAM) 2 g in sodium chloride 0.9 % 100 mL IVPB (0 g Intravenous Stopped 02/22/18 1135)  vancomycin (VANCOCIN) IVPB 1000 mg/200 mL premix (0 mg Intravenous Stopped 02/22/18 1250)     Initial Impression / Assessment and Plan / ED Course  I have reviewed the triage vital signs and the nursing notes.  Pertinent labs & imaging results that were available during my care of the patient were reviewed by me and considered in my medical decision making (see chart for details).  Clinical Course as of Feb 22 1322  Mon Feb 22, 2018  1220 The patient has an elevated creatinine, I do not feel comfortable giving a contrasted CT to this patient.  We will perform Noncon  Comprehensive metabolic panel(!) [BM]    Clinical Course User Index [BM] Noemi Chapel, MD    1.  Fever and hypotension, consider that this could be an infection as an etiology however the patient appears extremely well, his blood pressure is around 90 systolic, he has clear lungs and a rather unremarkable exam.  He does report having intermittent low blood pressures in the morning which is normal for him.  2.  Hematuria, he has had multiple stones but would also consider cystitis, infected kidney stone, may need further evaluation based on urinalysis.  3.  Resuscitation: The patient will get 1 L of IV fluids however the patient is not tachycardic, he is not febrile here, I question the etiology of sepsis, certainly his baseline physiology is different than the average person because of his heart transplant.  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   There was a delay in giving all of the IV fluids as the patient had history of hypotension in the mornings, he was not tachycardic and did not have an elevated lactic acid.  The  [facility] cardiology transplant team called me back and felt that the patient would best be served in the hospital but wanted to be judicious with IV fluids.  The patient on repeat exam after his first liter of IV fluids appears much better.  Vitals:   02/22/18 1015 02/22/18 1030 02/22/18 1045 02/22/18 1130  BP: (!) 82/49 (!) 74/61 90/68 92/61   Pulse: 99 98 (!) 105 97  Resp: (!) 23 (!) 26 (!) 28 (!) 25  Temp:      TempSrc:      SpO2: 95% 92% 97% 97%  Weight:      Height:       His blood pressure still fluctuates between 80 and 90, borderline tachycardia, will give the residual 30 cc/kg  Sepsis - Repeat Assessment  Performed at:    1:23 PM   Vitals     Blood pressure 92/61, pulse 97, temperature 99.5 F (37.5 C), temperature source Oral, resp. rate (!) 25, height 6\' 2"  (1.88 m), weight 98.9 kg (218 lb), SpO2 97 %.  Heart:     Regular rate and rhythm  Lungs:    CTA  Capillary Refill:   <2 sec  Peripheral Pulse:   Radial pulse palpable  Skin:     Normal Color   Will admit D/w  hospitalist who will admit - appreciate Dr. Lorin Mercy and her assistance.  Final Clinical Impressions(s) / ED Diagnoses   Final diagnoses:  Severe sepsis ([place])  Pyelonephritis      Noemi Chapel, MD 02/22/18 1323

## 2018-02-22 NOTE — Progress Notes (Signed)
Pharmacy Antibiotic Note  Glenn Gonzalez is a 57 y.o. male admitted on 02/22/2018 with sepsis.  Pharmacy has been consulted for aztreonam, vancomycin, levaquin dosing. Patient has of heart transplant in 2017. UA positive, WBC 16.6, afebrile (however reported 103 fever at home last night).  Plan: Vancomycin 750mg  IV every 12 hours.  Goal trough 15-20 mcg/mL. Aztreonam 1G IV every 8 hours Levaquin 750mg  IV QD   Height: 6\' 2"  (188 cm) Weight: 218 lb (98.9 kg) IBW/kg (Calculated) : 82.2  Temp (24hrs), Avg:99.5 F (37.5 C), Min:99.5 F (37.5 C), Max:99.5 F (37.5 C)  No results for input(s): WBC, CREATININE, LATICACIDVEN, VANCOTROUGH, VANCOPEAK, VANCORANDOM, GENTTROUGH, GENTPEAK, GENTRANDOM, TOBRATROUGH, TOBRAPEAK, TOBRARND, AMIKACINPEAK, AMIKACINTROU, AMIKACIN in the last 168 hours.  CrCl cannot be calculated (Patient's most recent lab result is older than the maximum 21 days allowed.).    Allergies  Allergen Reactions  . Digoxin Other (See Comments)    gynecomastia   . Phencyclidine Other (See Comments)    PCP derived antibiotic > Insomnia  . Spironolactone Other (See Comments)    Gynecomastia  . Lactose Intolerance (Gi) Diarrhea    cramps  . Milk-Related Compounds Other (See Comments)    Cramps and diarrhea Lactose intolerance   . Penicillins     Cramps Has patient had a PCN reaction causing immediate rash, facial/tongue/throat swelling, SOB or lightheadedness with hypotension: No Has patient had a PCN reaction causing severe rash involving mucus membranes or skin necrosis: No Has patient had a PCN reaction that required hospitalization No Has patient had a PCN reaction occurring within the last 10 years: No If all of the above answers are "NO", then may proceed with Cephalosporin use.      Thank you for allowing pharmacy to be a part of this patient's care.  Glenn Gonzalez 02/22/2018 11:39 AM

## 2018-02-22 NOTE — ED Notes (Signed)
Heart healthy meal tray ordered for pt at this time

## 2018-02-22 NOTE — H&P (Signed)
History and Physical    Glenn Gonzalez KPV:[id number] DOB: [birth year] DOA: 02/22/2018  PCP: Haywood Pao, MD Consultants:  Posey Pronto - transplant suregery Patient coming from:  Home - lives with wife and 2 children; NOK: wife, [phone]  Chief Complaint:  fever  HPI: Glenn Gonzalez is a 57 y.o. male with medical history significant of heart transplantation; stage 3 CKD; hypothyroidism; Hodgkin's lymphoma in 1980; and kidney stones presenting with fever.  On Friday, he noticed a "sinus infection" - green nasal d/c.  He was started on Erythromycin and felt better "almost instantly."  Great day Saturday, then 1 bout of loose bowels Saturday night.  Severe chills and fever to 99 overnight Saturday night.  By the end of the evening yesterday, he noticed some dried blood at the end of his urinary stream and ongoing bleeding post-void.  Last night, he was noticing spasms of his urinary sphincter.  He awoke with diaphoresis this AM.  He called his PCP and was told to come to the ER.  The urinary sphincter spasms is already improved.  He does have mild spasm with that at baseline since his lymphoma in childhood, but the severity was much greater.  He had minimal PO intake this AM.  Slight UOP after fluids but otherwise almost no UOP.  Tmax 103.5.  Persistent sinus pain.  Last dose of erythromycin was last night.  Essentially negative echo 12/17/17.  ED Course:   Tends to be hypotensive in AMs.  Fever to 103 last night with urinary symptoms.  He was called in antispasmotic, not helpful.  BPs 70-90s.  WBC 19 (on prednisone and anti-rejection meds).  No abscess on CT.  [facility] transplant team - IV abx, admission, cultures.  Creatinine 1.86 (baseline creatinine 1.6).    Review of Systems: As per HPI; otherwise review of systems reviewed and negative.   Ambulatory Status:  Ambulates without assistance  Past Medical History:  Diagnosis Date  . A-fib ([place])   . Asthma    pt. denies  . Depression   .  GERD (gastroesophageal reflux disease)   . History of kidney stones   . History of radiation therapy   . Hodgkin's lymphoma ([place]) 1980   IIIB. 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  . Hypothyroidism   . Pneumonia    recurrent pneumonia sedf.  rad tx for lymphoma  . Shortness of breath dyspnea    with exertion  . Status post heart transplantation [facility])     Past Surgical History:  Procedure Laterality Date  . CARDIAC CATHETERIZATION N/A 04/20/2015   Procedure: Right/Left Heart Cath and Coronary Angiography;  Surgeon: Jolaine Artist, MD;  Location: [place] [facility];  Service: Cardiovascular;  Laterality: N/A;  . CARDIAC CATHETERIZATION  10-11-15   pt states routine heart cath to be done at [facility]   . CARDIAC CATHETERIZATION N/A 04/02/2016   Procedure: Right Heart Cath;  Surgeon: Jolaine Artist, MD;  Location: [place] [facility];  Service: Cardiovascular;  Laterality: N/A;  . COLONOSCOPY WITH PROPOFOL N/A 08/03/2015   Procedure: COLONOSCOPY WITH PROPOFOL;  Surgeon: Carol Ada, MD;  Location: [facility];  Service: Endoscopy;  Laterality: N/A;  wants to try to do procedure without sedation  . CYSTOSCOPY WITH RETROGRADE PYELOGRAM, URETEROSCOPY AND STENT PLACEMENT Right 10/12/2015   Procedure: CYSTOSCOPY WITH RETROGRADE PYELOGRAM, URETEROSCOPY AND STENT PLACEMENT;  Surgeon: Cleon Gustin, MD;  Location: [facility];  Service: Urology;  Laterality: Right;  . ESOPHAGOGASTRODUODENOSCOPY    .  EXPLORATORY LAPAROTOMY    . gynemastia Bilateral   . HEART TRANSPLANT  06/20/2016   at [facility]  . LUMBAR LAMINECTOMY/DECOMPRESSION MICRODISCECTOMY Right 01/04/2016   Procedure: Microdiscectomy Lumbar four Lumbar five right;  Surgeon: Eustace Moore, MD;  Location: [place] [facility] ORS;  Service: Neurosurgery;  Laterality: Right;  . neck biopses     x5  . permanent pacemaker  2011   [employer] COGNIS device  . PORTACATH PLACEMENT     06-29-15 inserted, now removed.  Marland Kitchen STONE EXTRACTION WITH  BASKET Right 10/12/2015   Procedure: STONE EXTRACTION WITH BASKET;  Surgeon: Cleon Gustin, MD;  Location: [facility];  Service: Urology;  Laterality: Right;  . TONSILLECTOMY      Social History   Socioeconomic History  . Marital status: Married    Spouse name: Not on file  . Number of children: Not on file  . Years of education: Not on file  . Highest education level: Not on file  Occupational History  . Occupation: disabled  Social Needs  . Financial resource strain: Not on file  . Food insecurity:    Worry: Not on file    Inability: Not on file  . Transportation needs:    Medical: Not on file    Non-medical: Not on file  Tobacco Use  . Smoking status: Never Smoker  . Smokeless tobacco: Never Used  Substance and Sexual Activity  . Alcohol use: No  . Drug use: No  . Sexual activity: Not on file  Lifestyle  . Physical activity:    Days per week: Not on file    Minutes per session: Not on file  . Stress: Not on file  Relationships  . Social connections:    Talks on phone: Not on file    Gets together: Not on file    Attends religious service: Not on file    Active member of club or organization: Not on file    Attends meetings of clubs or organizations: Not on file    Relationship status: Not on file  . Intimate partner violence:    Fear of current or ex partner: Not on file    Emotionally abused: Not on file    Physically abused: Not on file    Forced sexual activity: Not on file  Other Topics Concern  . Not on file  Social History Narrative  . Not on file    Allergies  Allergen Reactions  . Digoxin Other (See Comments)    gynecomastia   . Phencyclidine Other (See Comments)    PCP derived antibiotic > Insomnia  . Spironolactone Other (See Comments)    Gynecomastia  . Lactose Intolerance (Gi) Diarrhea    cramps  . Milk-Related Compounds Other (See Comments)    Cramps and diarrhea Lactose intolerance   . Penicillins     Cramps Has patient had a  PCN reaction causing immediate rash, facial/tongue/throat swelling, SOB or lightheadedness with hypotension: No Has patient had a PCN reaction causing severe rash involving mucus membranes or skin necrosis: No Has patient had a PCN reaction that required hospitalization No Has patient had a PCN reaction occurring within the last 10 years: No If all of the above answers are "NO", then may proceed with Cephalosporin use.      Family History  Problem Relation Age of Onset  . Hyperthyroidism Mother   . Insomnia Mother   . Hypertension Father   . Depression Father   . Insomnia Father  Prior to Admission medications   Medication Sig Start Date End Date Taking? Authorizing Provider  acetaminophen (TYLENOL) 650 MG CR tablet Take 1,300 mg by mouth at bedtime.    Yes [provider]  allopurinol (ZYLOPRIM) 100 MG tablet Take 100 mg by mouth daily. 01/04/18  Yes [provider]  aspirin EC 81 MG tablet Take 81 mg by mouth daily.   Yes [provider]  calcium carbonate (OSCAL) 1500 (600 Ca) MG TABS tablet Take 1,500 mg by mouth 2 (two) times daily with a meal. Separated from transplant medications.   Yes [provider]  cetirizine (ZYRTEC) 10 MG tablet Take 10 mg by mouth at bedtime.    Yes [provider]  cholecalciferol (VITAMIN D) 400 units TABS tablet Take 400 Units by mouth 2 (two) times daily. Takes with calcium (has combination tablet.   Yes [provider]  ENVARSUS XR 1 MG TB24 Take 2 mg by mouth daily. 12/22/17  Yes [provider]  fluticasone (FLONASE) 50 MCG/ACT nasal spray Place 1 spray into both nostrils as needed for allergies or rhinitis.   Yes [provider]  furosemide (LASIX) 40 MG tablet Take 20 mg by mouth daily as needed for fluid. As needed based upon weight and fluid status   Yes [provider]  levothyroxine (SYNTHROID, LEVOTHROID) 125 MCG tablet Take 125 mcg by mouth every morning. 01/09/16   Yes [provider]  magnesium oxide (MAG-OX) 400 MG tablet Take 400 mg by mouth 2 (two) times daily. Separates from transplant meds. Takes with Ca/Vit D.   Yes [provider]  mycophenolate (CELLCEPT) 500 MG tablet Take 1,000 mg by mouth every 12 (twelve) hours.   Yes [provider]  oxybutynin (DITROPAN-XL) 5 MG 24 hr tablet Take 5 mg by mouth daily. 02/21/18 02/21/19 Yes [provider]  oxymetazoline (AFRIN) 0.05 % nasal spray Place 1 spray into both nostrils as needed for congestion.   Yes [provider]  polyethylene glycol (MIRALAX / GLYCOLAX) packet Take 17 g by mouth daily as needed for mild constipation.   Yes [provider]  predniSONE (DELTASONE) 10 MG tablet Take 12.5 mg by mouth daily with breakfast. 12/15/16 pt presently taking 5 mg then decrease to 2.5 mg on the 21 st.   Yes [provider]  RABEprazole (ACIPHEX) 20 MG tablet Take 1 tablet (20 mg total) by mouth 2 (two) times daily. 03/07/11  Yes Bensimhon, Shaune Pascal, MD  valACYclovir (VALTREX) 500 MG tablet Take 500 mg by mouth every 12 (twelve) hours.   Yes [provider]    Physical Exam: Vitals:   02/22/18 1215 02/22/18 1330 02/22/18 1415 02/22/18 1445  BP: (!) 94/56 92/64 121/69 120/70  Pulse: 95 91 91 94  Resp: 19 (!) 27 (!) 25 15  Temp:      TempSrc:      SpO2: 97% 96% 98% 98%  Weight:      Height:         General:  Appears calm and comfortable and is NAD Eyes:  PERRL, EOMI, normal lids, iris; subconjunctival hemorrhage on the right medial eye ENT:  grossly normal hearing, lips & tongue, mmm; appropriate dentition Neck:  no LAD, masses or thyromegaly Cardiovascular:  RRR, no m/r/g. No LE edema.  Respiratory:   CTA bilaterally with no wheezes/rales/rhonchi.  Normal respiratory effort. Abdomen:  soft, mild suprapubic TTP, ND, NABS Skin:  no rash or induration seen on limited exam Musculoskeletal:  grossly  normal tone BUE/BLE, good ROM, no  bony abnormality Lower extremity:  No LE edema.  Limited foot exam with no ulcerations.  2+ distal pulses. Psychiatric:  grossly normal mood and affect, speech fluent and appropriate, AOx3 Neurologic:  CN 2-12 grossly intact, moves all extremities in coordinated fashion, sensation intact    Radiological Exams on Admission: Ct Abdomen Pelvis Wo Contrast  Result Date: 02/22/2018 CLINICAL DATA:  Evaluate pyelonephritis.  Hematuria and fevers. EXAM: CT ABDOMEN AND PELVIS WITHOUT CONTRAST TECHNIQUE: Multidetector CT imaging of the abdomen and pelvis was performed following the standard protocol without IV contrast. COMPARISON:  07/31/2015 FINDINGS: Lower chest: Pleural thickening is identified overlying the posterior left lower lobe noted. Hepatobiliary: No focal liver abnormality is seen. No gallstones, gallbladder wall thickening, or biliary dilatation. Pancreas: Unremarkable. No pancreatic ductal dilatation or surrounding inflammatory changes. Spleen: Previous splenectomy. Adrenals/Urinary Tract: The adrenal glands appear normal. Stone within the inferior pole of the right kidney measures 5 mm, image 47/3. Atrophy is noted involving the upper pole of the left kidney. Nonobstructing calculus is noted within the inferior pole of the left kidney, image 71/6. No hydronephrosis or perinephric fat stranding. Urinary bladder is unremarkable. Stomach/Bowel: Stomach is normal. The small bowel loops have a normal course and caliber without obstruction. Unremarkable appearance of the colon. Vascular/Lymphatic: Aortic atherosclerosis. No aneurysm. Surgical clips identified within the left retroperitoneum and adjacent to the head of pancreas. No adenopathy identified within the abdomen or pelvis. No inguinal adenopathy. Reproductive: Prostate gland measures 3.8 by 5.1 by 4.3 cm (volume = 44 cm^3). Other: No free fluid or fluid collections identified. Musculoskeletal: No suspicious bone lesions. IMPRESSION: 1. No acute  findings identified within the abdomen or pelvis. 2. Bilateral nonobstructing renal calculi. No evidence for hydronephrosis. 3.  Aortic Atherosclerosis (ICD10-I70.0). 4. Mild prostate gland enlargement. Electronically Signed   By: Kerby Moors M.D.   On: 02/22/2018 13:34   Dg Chest Portable 1 View  Result Date: 02/22/2018 CLINICAL DATA:  Fevers.  History of heart transplant. EXAM: PORTABLE CHEST 1 VIEW COMPARISON:  07/31/2015 FINDINGS: Previous median sternotomy. Normal heart size. No pleural effusion or edema. No airspace opacities identified. IMPRESSION: 1. No acute cardiopulmonary abnormalities. Electronically Signed   By: Kerby Moors M.D.   On: 02/22/2018 10:47    EKG: Independently reviewed.  Sinus tachycardia with rate 103; LAE; incomplete RBBB; RVH;  no evidence of acute ischemia   Labs on Admission: I have personally reviewed the available labs and imaging studies at the time of the admission.  Pertinent labs:   INR 1.19 Lactate 1.54 UA: large Hgb, moderate LE, 100 protein, many bacteria, TNTC RBC and WBC Blood and urine cultures pending BUN 23/Creatinine 1.86/GFR 39; 27/1.53/49 on 01/01/18 Albumin 3.2 Bilirubin 1.7 WBC 16.6 Platelets 505 Tacrolimus 4.9 on 01/01/18   Assessment/Plan Principal Problem:   Sepsis due to gram-negative UTI [facility]) Active Problems:   Hypotension   Status post heart transplantation ([place])   Acute kidney injury superimposed on CKD ([place])   Hypothyroidism   Sepsis due to UTI -Elevated WBC count, fever, tachycardia, tachypnea with normal and hypotension -While awaiting blood cultures, this appears to be a preseptic condition. -Sepsis protocol initiated; he is completing the sepsis bolus now due to hypotension -Suspect urinary source - he does have urinary sphincter spasm, hematuria, and fever -Blood and urine cultures pending -Will admit with telemetry and continue to monitor -Treat with IV Aztreonam and Levaquin (also received Vanc in ER but  this was not continued) for  healthcare-associated UTI -Will add HIV -Will order sepsis algorithm procalcitonin level.  Antibiotics would not be indicated for PCT <0.1 and probably should not be used for < 0.25.  >0.5 indicates infection and >>0.5 indicates more serious disease.  As the procalcitonin level normalizes, it will be reasonable to consider de-escalation of antibiotic coverage.  AKI on CKD -Likely due to prerenal failure secondary to dehydration in the setting of sepsis. -Hold prn Lasix for now -IVF - sepsis bolus and then 75 cc/hour of LR -Follow up renal function by BMP -Avoid ACEI and NSAIDs  S/p heart transplantation -Patient was appropriately reluctant to be hospitalized -Continue Envarsus and Cellcept as well as daily prednisone -There is low current suspicion for transplant rejection as the cause of the fever -Will monitor on telemetry -Judicious use of IVF to avoid volume overload  Hypotension -Patient reports baseline hypotension -However, upon questioning he does acknowledge that his presenting BPs were low even for him -This has resolved with the IVF bolus provided in the ER  Hypothyroidism -Normal thyroid studies at [facility] in 5/18, will repeat -Continue Synthroid at current dose for now   DVT prophylaxis: Lovenox Code Status:  Full - confirmed with patient/family Family Communication: Wife present throughout evaluation  Disposition Plan:  Home once clinically improved Consults called: None (Dr. Sabra Heck did speak with [facility] transplant doctors) Admission status: Admit - It is my clinical opinion that admission to [place] is reasonable and necessary because of the expectation that this patient will require hospital care that crosses at least 2 midnights to treat this condition based on the medical complexity of the problems presented.  Given the aforementioned information, the predictability of an adverse outcome is felt to be significant.    Karmen Bongo  MD [facility]  If note is complete, please contact covering daytime or nighttime physician. [email or web address] Password [facility]  02/22/2018, 3:19 PM

## 2018-02-22 NOTE — ED Notes (Signed)
Patient transported to CT 

## 2018-02-22 NOTE — ED Notes (Signed)
[employer] (Transfer Line)-called @ 1102-per Dr. Erskine Speed by Levada Dy

## 2018-02-23 LAB — BASIC METABOLIC PANEL
Anion gap: 9 (ref 5–15)
BUN: 18 mg/dL (ref 6–20)
CALCIUM: 8 mg/dL — AB (ref 8.9–10.3)
CO2: 23 mmol/L (ref 22–32)
CREATININE: 1.35 mg/dL — AB (ref 0.61–1.24)
Chloride: 105 mmol/L (ref 101–111)
GFR calc non Af Amer: 57 mL/min — ABNORMAL LOW (ref >60)
GLUCOSE: 97 mg/dL (ref 65–99)
Potassium: 4 mmol/L (ref 3.5–5.1)
Sodium: 137 mmol/L (ref 135–145)

## 2018-02-23 LAB — CBC
HCT: 33.3 % — ABNORMAL LOW (ref 39.0–52.0)
Hemoglobin: 10.1 g/dL — ABNORMAL LOW (ref 13.0–17.0)
MCH: 25.6 pg — AB (ref 26.0–34.0)
MCHC: 30.3 g/dL (ref 30.0–36.0)
MCV: 84.5 fL (ref 78.0–100.0)
PLATELETS: 489 10*3/uL — AB (ref 150–400)
RBC: 3.94 MIL/uL — AB (ref 4.22–5.81)
RDW: 16 % — ABNORMAL HIGH (ref 11.5–15.5)
WBC: 22.1 10*3/uL — ABNORMAL HIGH (ref 4.0–10.5)

## 2018-02-23 LAB — HIV ANTIBODY (ROUTINE TESTING W REFLEX): HIV Screen 4th Generation wRfx: NONREACTIVE

## 2018-02-23 MED ORDER — TACROLIMUS 1 MG PO TB24
2.0000 mg | ORAL_TABLET | Freq: Every day | ORAL | Status: DC
  Administered 2018-02-24: 2 mg via ORAL
  Filled 2018-02-23 (×3): qty 1

## 2018-02-23 MED ORDER — RABEPRAZOLE SODIUM 20 MG PO TBEC
20.0000 mg | DELAYED_RELEASE_TABLET | Freq: Two times a day (BID) | ORAL | Status: DC
  Administered 2018-02-23 – 2018-02-24 (×2): 20 mg via ORAL
  Filled 2018-02-23 (×2): qty 1

## 2018-02-23 MED ORDER — MYCOPHENOLATE MOFETIL 250 MG PO CAPS
1000.0000 mg | ORAL_CAPSULE | Freq: Two times a day (BID) | ORAL | Status: DC
  Filled 2018-02-23: qty 4

## 2018-02-23 MED ORDER — PREDNISONE 5 MG PO TABS
2.5000 mg | ORAL_TABLET | ORAL | Status: DC
  Administered 2018-02-24: 2.5 mg via ORAL
  Filled 2018-02-23: qty 1

## 2018-02-23 MED ORDER — MYCOPHENOLATE MOFETIL 500 MG PO TABS
1000.0000 mg | ORAL_TABLET | Freq: Two times a day (BID) | ORAL | Status: DC
  Administered 2018-02-23 – 2018-02-24 (×2): 1000 mg via ORAL
  Filled 2018-02-23 (×5): qty 2

## 2018-02-23 MED ORDER — CALCIUM CARBONATE 1250 (500 CA) MG PO TABS
1.0000 | ORAL_TABLET | ORAL | Status: DC
  Administered 2018-02-23 (×2): 500 mg via ORAL
  Filled 2018-02-23: qty 1

## 2018-02-23 MED ORDER — RABEPRAZOLE SODIUM 20 MG PO TBEC
20.0000 mg | DELAYED_RELEASE_TABLET | Freq: Two times a day (BID) | ORAL | Status: DC
  Filled 2018-02-23: qty 1

## 2018-02-23 MED ORDER — CALCIUM CARBONATE 1500 (600 CA) MG PO TABS
1500.0000 mg | ORAL_TABLET | ORAL | Status: DC
  Filled 2018-02-23: qty 1

## 2018-02-23 MED ORDER — TACROLIMUS 1 MG PO TB24
2.0000 | ORAL_TABLET | ORAL | Status: DC
  Filled 2018-02-23: qty 2

## 2018-02-23 NOTE — Plan of Care (Signed)
Patient continues to progress this shift. Will continue to monitor and treat patient per MD orders.

## 2018-02-23 NOTE — Progress Notes (Signed)
PROGRESS NOTE    Glenn Gonzalez  ELF:[id number] DOB: August 13, 1961 DOA: 02/22/2018 PCP: Glenn Pao, MD   Brief Narrative:  Glenn Gonzalez is a 57 y.o. male with medical history significant of heart transplantation; stage 3 CKD; hypothyroidism; Hodgkin's lymphoma in 1980; and kidney stones presenting with fever.  On Friday, he noticed a "sinus infection" - green nasal d/c.  He was started on Erythromycin and felt better "almost instantly."  Great day Saturday, then 1 bout of loose bowels Saturday night.  Severe chills and fever to 99 overnight Saturday night.  By the end of the evening yesterday, he noticed some dried blood at the end of his urinary stream and ongoing bleeding post-void.  Last night, he was noticing spasms of his urinary sphincter.  He awoke with diaphoresis this AM.  He called his PCP and was told to come to the ER.  The urinary sphincter spasms is already improved.  He does have mild spasm with that at baseline since his lymphoma in childhood, but the severity was much greater.  He had minimal PO intake this AM.  Slight UOP after fluids but otherwise almost no UOP.  Tmax 103.5.  Persistent sinus pain.  Last dose of erythromycin was last night.     Assessment & Plan:   Principal Problem:   Sepsis due to gram-negative UTI [name]) Active Problems:   Hypotension   Status post heart transplantation ([place])   Acute kidney injury superimposed on CKD ([place])   Hypothyroidism   1-Sepsis due to UTI; Continue with IV fluids.  Follow urine culture.  Blood culture. No growth.  On Aztreonam and Levaquin.  Repeat WBC in am.  Vitals stable, no further hypotension and he has been afebrile.  CT abdomen; bilateral non obstructive renal stones.   Leukocytosis; WBC up. Continue with IV antibiotics.  Follow culture.   2-AKI on CKD; related to hypovolemia; improved with IV fluids. Cr decreased to 1.3.   S/P heart transplant.  Continue with envarus, wife will bring medications.    On prednisone 2.5 mg daily.  Discussed with transplant team today. Keep in hospital overnight until culture available.  Check prograft level.  On Cellcept.  Pharmacist will make sure patient gets  medications at time patient takes it at home.   Hypothyroidism;  On synthroid.  TSH ; 1.06 Free t 4 1.2. Check free T 3.          DVT prophylaxis: lovenox Code Status: full code.  Family Communication: wife was at bedside.  Disposition Plan: home in 24 hours, when culture available.    Consultants:  none  Procedures: none   Antimicrobials:   Levaquin, Aztreonam.    Subjective: He is feeling very well, better. Wants to go home.  He has not received Envarus, the hospital doesn't have it. His wife will bring it.   Objective: Vitals:   02/22/18 1930 02/22/18 2037 02/23/18 0515 02/23/18 1500  BP: 135/73 (!) 149/68 121/68 109/62  Pulse: 91 (!) 107 93 88  Resp: 20 18 16 16   Temp:  98.8 F (37.1 C) 98.6 F (37 C) 98.3 F (36.8 C)  TempSrc:    Oral  SpO2: 97% 98% 99% 98%  Weight:      Height:       No intake or output data in the 24 hours ending 02/23/18 1703 Filed Weights   02/22/18 0958  Weight: 98.9 kg (218 lb)    Examination:  General exam: Appears calm and comfortable  Respiratory  system: Clear to auscultation. Respiratory effort normal. Cardiovascular system: S1 & S2 heard, RRR. No JVD, murmurs, rubs, gallops or clicks. No pedal edema. Gastrointestinal system: Abdomen is nondistended Central nervous system: Alert and oriented. Extremities: no edema Skin: No rashes, lesions or ulcers     Data Reviewed: I have personally reviewed following labs and imaging studies  CBC: Recent Labs  Lab 02/22/18 1032 02/23/18 0502  WBC 16.6* 22.1*  NEUTROABS 15.8*  --   HGB 11.5* 10.1*  HCT 36.6* 33.3*  MCV 85.7 84.5  PLT 505* 932*   Basic Metabolic Panel: Recent Labs  Lab 02/22/18 1032 02/23/18 0502  NA 133* 137  K 4.3 4.0  CL 102 105  CO2 19* 23   GLUCOSE 96 97  BUN 23* 18  CREATININE 1.86* 1.35*  CALCIUM 8.5* 8.0*   GFR: Estimated Creatinine Clearance: 76.8 mL/min (A) (by C-G formula based on SCr of 1.35 mg/dL (H)). Liver Function Tests: Recent Labs  Lab 02/22/18 1032  AST 26  ALT 16*  ALKPHOS 69  BILITOT 1.7*  PROT 5.7*  ALBUMIN 3.2*   No results for input(s): LIPASE, AMYLASE in the last 168 hours. No results for input(s): AMMONIA in the last 168 hours. Coagulation Profile: Recent Labs  Lab 02/22/18 1200  INR 1.19   Cardiac Enzymes: No results for input(s): CKTOTAL, CKMB, CKMBINDEX, TROPONINI in the last 168 hours. BNP (last 3 results) No results for input(s): PROBNP in the last 8760 hours. HbA1C: No results for input(s): HGBA1C in the last 72 hours. CBG: No results for input(s): GLUCAP in the last 168 hours. Lipid Profile: No results for input(s): CHOL, HDL, LDLCALC, TRIG, CHOLHDL, LDLDIRECT in the last 72 hours. Thyroid Function Tests: Recent Labs    02/22/18 1515  TSH 1.062  FREET4 1.20*   Anemia Panel: No results for input(s): VITAMINB12, FOLATE, FERRITIN, TIBC, IRON, RETICCTPCT in the last 72 hours. Sepsis Labs: Recent Labs  Lab 02/22/18 1209 02/22/18 1515  PROCALCITON  --  6.39  LATICACIDVEN 1.54  --     Recent Results (from the past 240 hour(s))  Culture, blood (Routine x 2)     Status: None (Preliminary result)   Collection Time: 02/22/18 10:15 AM  Result Value Ref Range Status   Specimen Description BLOOD RIGHT HAND  Final   Special Requests   Final    BOTTLES DRAWN AEROBIC AND ANAEROBIC Blood Culture results may not be optimal due to an inadequate volume of blood received in culture bottles   Culture   Final    NO GROWTH < 24 HOURS Performed at [facility] [address] [place], [place] [address]    Report Status PENDING  Incomplete  Culture, blood (Routine x 2)     Status: None (Preliminary result)   Collection Time: 02/22/18 10:35 AM  Result Value Ref Range Status    Specimen Description BLOOD LEFT HAND  Final   Special Requests   Final    BOTTLES DRAWN AEROBIC AND ANAEROBIC Blood Culture results may not be optimal due to an inadequate volume of blood received in culture bottles   Culture   Final    NO GROWTH < 24 HOURS Performed at [facility], [place] [address], [place], [place] [address]    Report Status PENDING  Incomplete         Radiology Studies: Ct Abdomen Pelvis Wo Contrast  Result Date: 02/22/2018 CLINICAL DATA:  Evaluate pyelonephritis.  Hematuria and fevers. EXAM: CT ABDOMEN AND PELVIS  WITHOUT CONTRAST TECHNIQUE: Multidetector CT imaging of the abdomen and pelvis was performed following the standard protocol without IV contrast. COMPARISON:  07/31/2015 FINDINGS: Lower chest: Pleural thickening is identified overlying the posterior left lower lobe noted. Hepatobiliary: No focal liver abnormality is seen. No gallstones, gallbladder wall thickening, or biliary dilatation. Pancreas: Unremarkable. No pancreatic ductal dilatation or surrounding inflammatory changes. Spleen: Previous splenectomy. Adrenals/Urinary Tract: The adrenal glands appear normal. Stone within the inferior pole of the right kidney measures 5 mm, image 47/3. Atrophy is noted involving the upper pole of the left kidney. Nonobstructing calculus is noted within the inferior pole of the left kidney, image 71/6. No hydronephrosis or perinephric fat stranding. Urinary bladder is unremarkable. Stomach/Bowel: Stomach is normal. The small bowel loops have a normal course and caliber without obstruction. Unremarkable appearance of the colon. Vascular/Lymphatic: Aortic atherosclerosis. No aneurysm. Surgical clips identified within the left retroperitoneum and adjacent to the head of pancreas. No adenopathy identified within the abdomen or pelvis. No inguinal adenopathy. Reproductive: Prostate gland measures 3.8 by 5.1 by 4.3 cm (volume = 44 cm^3). Other: No free fluid or fluid collections  identified. Musculoskeletal: No suspicious bone lesions. IMPRESSION: 1. No acute findings identified within the abdomen or pelvis. 2. Bilateral nonobstructing renal calculi. No evidence for hydronephrosis. 3.  Aortic Atherosclerosis (ICD10-I70.0). 4. Mild prostate gland enlargement. Electronically Signed   By: Kerby Moors M.D.   On: 02/22/2018 13:34   Dg Chest Portable 1 View  Result Date: 02/22/2018 CLINICAL DATA:  Fevers.  History of heart transplant. EXAM: PORTABLE CHEST 1 VIEW COMPARISON:  07/31/2015 FINDINGS: Previous median sternotomy. Normal heart size. No pleural effusion or edema. No airspace opacities identified. IMPRESSION: 1. No acute cardiopulmonary abnormalities. Electronically Signed   By: Kerby Moors M.D.   On: 02/22/2018 10:47        Scheduled Meds: . acetaminophen  1,000 mg Oral QHS  . allopurinol  100 mg Oral Daily  . aspirin EC  81 mg Oral Daily  . calcium carbonate  1 tablet Oral Q24H  . [START ON 02/24/2018] calcium carbonate  1,500 mg Oral Q24H  . cetirizine  10 mg Oral QHS  . cholecalciferol  400 Units Oral BID  . enoxaparin (LOVENOX) injection  40 mg Subcutaneous Q24H  . levothyroxine  125 mcg Oral BH-q7a  . magnesium oxide  400 mg Oral BID  . mycophenolate  1,000 mg Oral Q12H  . oxybutynin  5 mg Oral Daily  . [START ON 02/24/2018] predniSONE  2.5 mg Oral Q24H  . RABEprazole  20 mg Oral BID AC  . sodium chloride flush  3 mL Intravenous Q12H  . Tacrolimus  2 tablet Oral Q24H   Continuous Infusions: . aztreonam Stopped (02/23/18 1420)  . lactated ringers 75 mL/hr at 02/23/18 0601  . levofloxacin (LEVAQUIN) IV Stopped (02/23/18 1144)     LOS: 1 day    Time spent: 35 minutes.     Elmarie Shiley, MD [facility] Pager [phone]  If 7PM-7AM, please contact night-coverage [email or web address] Password [facility] 02/23/2018, 5:03 PM

## 2018-02-24 LAB — BASIC METABOLIC PANEL
Anion gap: 7 (ref 5–15)
BUN: 17 mg/dL (ref 6–20)
CALCIUM: 8.3 mg/dL — AB (ref 8.9–10.3)
CHLORIDE: 105 mmol/L (ref 101–111)
CO2: 25 mmol/L (ref 22–32)
CREATININE: 1.19 mg/dL (ref 0.61–1.24)
GFR calc non Af Amer: >60 mL/min (ref >60)
GLUCOSE: 100 mg/dL — AB (ref 65–99)
Potassium: 4 mmol/L (ref 3.5–5.1)
Sodium: 137 mmol/L (ref 135–145)

## 2018-02-24 LAB — CBC
HEMATOCRIT: 30 % — AB (ref 39.0–52.0)
HEMOGLOBIN: 9.3 g/dL — AB (ref 13.0–17.0)
MCH: 26.6 pg (ref 26.0–34.0)
MCHC: 31 g/dL (ref 30.0–36.0)
MCV: 85.7 fL (ref 78.0–100.0)
Platelets: 441 10*3/uL — ABNORMAL HIGH (ref 150–400)
RBC: 3.5 MIL/uL — ABNORMAL LOW (ref 4.22–5.81)
RDW: 16.5 % — AB (ref 11.5–15.5)
WBC: 12.2 10*3/uL — ABNORMAL HIGH (ref 4.0–10.5)

## 2018-02-24 LAB — PROCALCITONIN: Procalcitonin: 4.62 ng/mL

## 2018-02-24 LAB — URINE CULTURE: Culture: NO GROWTH

## 2018-02-24 LAB — TACROLIMUS LEVEL: Tacrolimus (FK506) - LabCorp: 5.1 ng/mL (ref 2.0–20.0)

## 2018-02-24 MED ORDER — PREDNISONE 2.5 MG PO TABS: 2.5000 mg | ORAL_TABLET | ORAL | 0 refills | Status: AC

## 2018-02-24 MED ORDER — CALCIUM CARBONATE 1250 (500 CA) MG PO TABS: 1.0000 | ORAL_TABLET | ORAL | Status: DC

## 2018-02-24 MED ORDER — LEVOFLOXACIN 750 MG PO TABS: 750.0000 mg | ORAL_TABLET | Freq: Every day | ORAL | 0 refills | Status: AC

## 2018-02-24 NOTE — Care Management Note (Addendum)
Case Management Note  Patient Details  Name: Glenn Gonzalez MRN: [id number] Date of Birth: November 16, 1961  Subjective/Objective:          UTI         Oumar Marcott (Spouse)     [phone]       [id number] [place]  Action/Plan: Transition to home.  Expected Discharge Date:  02/24/18               Expected Discharge Plan:  Home/Self Care  In-House Referral:     Discharge planning Services  CM Consult  Post Acute Care Choice:   N/A Choice offered to:   N/A  DME Arranged:   N/A DME Agency:   N/A  HH Arranged:   N/A HH Agency:   N/A  Status of Service:  Completed, signed off  If discussed at [place] of Stay Meetings, dates discussed:    Additional Comments:  Sharin Mons, RN 02/24/2018, 12:00 PM

## 2018-02-24 NOTE — Discharge Instructions (Signed)
Please follow with PCP for evaluation of anemia , also follow results of Free T 3 and tacrolimus level.

## 2018-02-24 NOTE — Progress Notes (Signed)
Home medications returned to patient.  Patient discharge teaching given, including activity, diet, follow-up appoints, and medications. Patient verbalized understanding of all discharge instructions. IV access was d/c'd. Vitals are stable. Skin is intact except as charted in most recent assessments. Pt to be escorted out by NT, to be driven home by wife.

## 2018-02-24 NOTE — Discharge Summary (Addendum)
Physician Discharge Summary  Glenn Gonzalez BPZ:[id number] DOB: 05/12/1961 DOA: 02/22/2018  PCP: Haywood Pao, MD  Admit date: 02/22/2018 Discharge date: 02/24/2018  Admitted From: Home  Disposition:  Home    Recommendations for Outpatient Follow-up:  1. Follow up with PCP in 1-2 weeks 2. Please obtain BMP/CBC in one week 3. Please follow up on the following pending results: follow Free T 3 adjust synthroid as indicated. Follow tacrolimus level.  4. Please check anemia panel.   Home Health; none  Discharge Condition: stable.  CODE STATUS: full code.  Diet recommendation: Heart Healthy    Brief/Interim Summary: Brief Narrative: Glenn Genet Duehringis a 57 y.o.malewith medical history significant of heart transplantation; stage 3 CKD; hypothyroidism; Hodgkin's lymphoma in 1980; and kidney stones presenting with fever.On Friday, he noticed a "sinus infection" - green nasal d/c. He was started on Erythromycin and felt better "almost instantly." Great day Saturday, then 1 bout of loose bowels Saturday night. Severe chills and fever to 99 overnight Saturday night. By the end of the evening yesterday, he noticed some dried blood at the end of his urinary stream and ongoing bleeding post-void. Last night, he was noticing spasms of his urinary sphincter. He awoke with diaphoresis this AM. He called his PCP and was told to come to the ER. The urinary sphincter spasms is already improved. He does have mild spasm with that at baseline since his lymphoma in childhood, but the severity was much greater. He had minimal PO intake this AM. Slight UOP after fluids but otherwise almost no UOP. Tmax 103.5. Persistent sinus pain. Last dose of erythromycin was last night.     Assessment & Plan:   Principal Problem:   Sepsis due to gram-negative UTI [facility]) Active Problems:   Hypotension   Status post heart transplantation ([place])   Acute kidney injury superimposed on CKD ([place])    Hypothyroidism   1-Sepsis due to UTI; Treated  with IV fluids and IV antibiotics.  Blood culture. No growth ion 48 hours.   On Aztreonam and Levaquin. Received 3 days of IV antibiotics. Urine culture with no growth. He will be discharge on Levaquin for  4 more days.  CT abdomen; bilateral non obstructive renal stones.  stable for discharge.  Leukocytosis; trending down.   2-AKI on CKD; related to hypovolemia; improved with IV fluids. Cr decreased to 1.1  S/P heart transplant.  Continue with envarus.  On prednisone 2.5 mg daily.  Updated heart transplant team, about patient condition. Patient will be discharge home today  Check tacrolimus level pending On [employer].  patient denies dyspnea, chest pain.   Hypothyroidism;  On synthroid.  TSH ; 1.06 Free t 4 1.2. free T 3 pending, follow results and adjust med as needed.    Anemia; suspect hb at 11  on admission was due to hemoconcentration. Today at 9 prior 10. He denies melena, blood in the stool/ he opted to follow up with PCP for anemia panel.    Discharge Diagnoses:  Principal Problem:   Sepsis due to gram-negative UTI [facility]) Active Problems:   Hypotension   Status post heart transplantation ([place])   Acute kidney injury superimposed on CKD [facility])   Hypothyroidism    Discharge Instructions  Discharge Instructions    Diet - low sodium heart healthy   Complete by:  As directed    Increase activity slowly   Complete by:  As directed      Allergies as of 02/24/2018  Reactions   Digoxin Other (See Comments)   gynecomastia   Phencyclidine Other (See Comments)   PCP derived antibiotic > Insomnia   Spironolactone Other (See Comments)   Gynecomastia   Lactose Intolerance (gi) Diarrhea   cramps   Milk-related Compounds Other (See Comments)   Cramps and diarrhea Lactose intolerance   Penicillins    Cramps Has patient had a PCN reaction causing immediate rash, facial/tongue/throat swelling, SOB or  lightheadedness with hypotension: No Has patient had a PCN reaction causing severe rash involving mucus membranes or skin necrosis: No Has patient had a PCN reaction that required hospitalization No Has patient had a PCN reaction occurring within the last 10 years: No If all of the above answers are "NO", then may proceed with Cephalosporin use.      Medication List    STOP taking these medications   oxymetazoline 0.05 % nasal spray Commonly known as:  AFRIN     TAKE these medications   acetaminophen 650 MG CR tablet Commonly known as:  TYLENOL Take 1,300 mg by mouth at bedtime.   allopurinol 100 MG tablet Commonly known as:  ZYLOPRIM Take 100 mg by mouth daily.   aspirin EC 81 MG tablet Take 81 mg by mouth daily.   calcium carbonate 1500 (600 Ca) MG Tabs tablet Commonly known as:  OSCAL Take 1,500 mg by mouth 2 (two) times daily with a meal. Separated from transplant medications.   cetirizine 10 MG tablet Commonly known as:  ZYRTEC Take 10 mg by mouth at bedtime.   cholecalciferol 400 units Tabs tablet Commonly known as:  VITAMIN D Take 400 Units by mouth 2 (two) times daily. Takes with calcium (has combination tablet.   ENVARSUS XR 1 MG Tb24 Generic drug:  Tacrolimus Take 2 mg by mouth daily.   fluticasone 50 MCG/ACT nasal spray Commonly known as:  FLONASE Place 1 spray into both nostrils as needed for allergies or rhinitis.   furosemide 40 MG tablet Commonly known as:  LASIX Take 20 mg by mouth daily as needed for fluid. As needed based upon weight and fluid status   levofloxacin 750 MG tablet Commonly known as:  LEVAQUIN Take 1 tablet (750 mg total) by mouth daily for 4 days.   levothyroxine 125 MCG tablet Commonly known as:  SYNTHROID, LEVOTHROID Take 125 mcg by mouth every morning.   magnesium oxide 400 MG tablet Commonly known as:  MAG-OX Take 400 mg by mouth 2 (two) times daily. Separates from transplant meds. Takes with Ca/Vit D.   mycophenolate  500 MG tablet Commonly known as:  CELLCEPT Take 1,000 mg by mouth every 12 (twelve) hours.   oxybutynin 5 MG 24 hr tablet Commonly known as:  DITROPAN-XL Take 5 mg by mouth daily.   polyethylene glycol packet Commonly known as:  MIRALAX / GLYCOLAX Take 17 g by mouth daily as needed for mild constipation.   predniSONE 2.5 MG tablet Commonly known as:  DELTASONE Take 1 tablet (2.5 mg total) by mouth daily. Start taking on:  02/25/2018 What changed:    medication strength  how much to take  when to take this  additional instructions   RABEprazole 20 MG tablet Commonly known as:  ACIPHEX Take 1 tablet (20 mg total) by mouth 2 (two) times daily.   valACYclovir 500 MG tablet Commonly known as:  VALTREX Take 500 mg by mouth every 12 (twelve) hours.      Follow-up Information    Tisovec, Fransico Him, MD Follow up  in 3 day(s).   Specialty:  Internal Medicine Contact information: [place] [address] [phone]          Allergies  Allergen Reactions  . Digoxin Other (See Comments)    gynecomastia   . Phencyclidine Other (See Comments)    PCP derived antibiotic > Insomnia  . Spironolactone Other (See Comments)    Gynecomastia  . Lactose Intolerance (Gi) Diarrhea    cramps  . Milk-Related Compounds Other (See Comments)    Cramps and diarrhea Lactose intolerance   . Penicillins     Cramps Has patient had a PCN reaction causing immediate rash, facial/tongue/throat swelling, SOB or lightheadedness with hypotension: No Has patient had a PCN reaction causing severe rash involving mucus membranes or skin necrosis: No Has patient had a PCN reaction that required hospitalization No Has patient had a PCN reaction occurring within the last 10 years: No If all of the above answers are "NO", then may proceed with Cephalosporin use.      Consultations:  none   Procedures/Studies: Ct Abdomen Pelvis Wo Contrast  Result Date: 02/22/2018 CLINICAL  DATA:  Evaluate pyelonephritis.  Hematuria and fevers. EXAM: CT ABDOMEN AND PELVIS WITHOUT CONTRAST TECHNIQUE: Multidetector CT imaging of the abdomen and pelvis was performed following the standard protocol without IV contrast. COMPARISON:  07/31/2015 FINDINGS: Lower chest: Pleural thickening is identified overlying the posterior left lower lobe noted. Hepatobiliary: No focal liver abnormality is seen. No gallstones, gallbladder wall thickening, or biliary dilatation. Pancreas: Unremarkable. No pancreatic ductal dilatation or surrounding inflammatory changes. Spleen: Previous splenectomy. Adrenals/Urinary Tract: The adrenal glands appear normal. Stone within the inferior pole of the right kidney measures 5 mm, image 47/3. Atrophy is noted involving the upper pole of the left kidney. Nonobstructing calculus is noted within the inferior pole of the left kidney, image 71/6. No hydronephrosis or perinephric fat stranding. Urinary bladder is unremarkable. Stomach/Bowel: Stomach is normal. The small bowel loops have a normal course and caliber without obstruction. Unremarkable appearance of the colon. Vascular/Lymphatic: Aortic atherosclerosis. No aneurysm. Surgical clips identified within the left retroperitoneum and adjacent to the head of pancreas. No adenopathy identified within the abdomen or pelvis. No inguinal adenopathy. Reproductive: Prostate gland measures 3.8 by 5.1 by 4.3 cm (volume = 44 cm^3). Other: No free fluid or fluid collections identified. Musculoskeletal: No suspicious bone lesions. IMPRESSION: 1. No acute findings identified within the abdomen or pelvis. 2. Bilateral nonobstructing renal calculi. No evidence for hydronephrosis. 3.  Aortic Atherosclerosis (ICD10-I70.0). 4. Mild prostate gland enlargement. Electronically Signed   By: Kerby Moors M.D.   On: 02/22/2018 13:34   Dg Chest Portable 1 View  Result Date: 02/22/2018 CLINICAL DATA:  Fevers.  History of heart transplant. EXAM: PORTABLE  CHEST 1 VIEW COMPARISON:  07/31/2015 FINDINGS: Previous median sternotomy. Normal heart size. No pleural effusion or edema. No airspace opacities identified. IMPRESSION: 1. No acute cardiopulmonary abnormalities. Electronically Signed   By: Kerby Moors M.D.   On: 02/22/2018 10:47       Subjective: He is feeling well, denies chest pain, dyspnea. He report an episode last night where he bit his lips, but denies urinary or bladder incontinence. Suspect he has been anxious of been in the hospital. Only has some sinus frontal headaches.  Overall he feel better, willing to go home.   Discharge Exam: Vitals:   02/23/18 2119 02/24/18 0544  BP: 134/73 138/81  Pulse: 86 84  Resp: 18 17  Temp: 99 F (37.2 C) 97.8  F (36.6 C)  SpO2: 97% 97%   Vitals:   02/23/18 0515 02/23/18 1500 02/23/18 2119 02/24/18 0544  BP: 121/68 109/62 134/73 138/81  Pulse: 93 88 86 84  Resp: 16 16 18 17   Temp: 98.6 F (37 C) 98.3 F (36.8 C) 99 F (37.2 C) 97.8 F (36.6 C)  TempSrc:  Oral    SpO2: 99% 98% 97% 97%  Weight:      Height:        General: Pt is alert, awake, not in acute distress Cardiovascular: RRR, S1/S2 +, no rubs, no gallops Respiratory: CTA bilaterally, no wheezing, no rhonchi Abdominal: Soft, NT, ND, bowel sounds + Extremities: no edema, no cyanosis    The results of significant diagnostics from this hospitalization (including imaging, microbiology, ancillary and laboratory) are listed below for reference.     Microbiology: Recent Results (from the past 240 hour(s))  Culture, blood (Routine x 2)     Status: None (Preliminary result)   Collection Time: 02/22/18 10:15 AM  Result Value Ref Range Status   Specimen Description BLOOD RIGHT HAND  Final   Special Requests   Final    BOTTLES DRAWN AEROBIC AND ANAEROBIC Blood Culture results may not be optimal due to an inadequate volume of blood received in culture bottles   Culture   Final    NO GROWTH 2 DAYS Performed at [facility], [place] [address] [place], [place] [address]    Report Status PENDING  Incomplete  Culture, blood (Routine x 2)     Status: None (Preliminary result)   Collection Time: 02/22/18 10:35 AM  Result Value Ref Range Status   Specimen Description BLOOD LEFT HAND  Final   Special Requests   Final    BOTTLES DRAWN AEROBIC AND ANAEROBIC Blood Culture results may not be optimal due to an inadequate volume of blood received in culture bottles   Culture   Final    NO GROWTH 2 DAYS Performed at [facility], [place] [address] [place], [place] [address]    Report Status PENDING  Incomplete  Urine Culture     Status: None   Collection Time: 02/22/18  2:35 PM  Result Value Ref Range Status   Specimen Description URINE, CLEAN CATCH  Final   Special Requests NONE  Final   Culture   Final    NO GROWTH Performed at [facility], [address], [place], [place] [address]    Report Status 02/24/2018 FINAL  Final     Labs: BNP (last 3 results) No results for input(s): BNP in the last 8760 hours. Basic Metabolic Panel: Recent Labs  Lab 02/22/18 1032 02/23/18 0502 02/24/18 0642  NA 133* 137 137  K 4.3 4.0 4.0  CL 102 105 105  CO2 19* 23 25  GLUCOSE 96 97 100*  BUN 23* 18 17  CREATININE 1.86* 1.35* 1.19  CALCIUM 8.5* 8.0* 8.3*   Liver Function Tests: Recent Labs  Lab 02/22/18 1032  AST 26  ALT 16*  ALKPHOS 69  BILITOT 1.7*  PROT 5.7*  ALBUMIN 3.2*   No results for input(s): LIPASE, AMYLASE in the last 168 hours. No results for input(s): AMMONIA in the last 168 hours. CBC: Recent Labs  Lab 02/22/18 1032 02/23/18 0502 02/24/18 0642  WBC 16.6* 22.1* 12.2*  NEUTROABS 15.8*  --   --   HGB 11.5* 10.1* 9.3*  HCT 36.6* 33.3* 30.0*  MCV 85.7 84.5 85.7  PLT 505* 489*  441*   Cardiac Enzymes: No results for input(s): CKTOTAL, CKMB, CKMBINDEX, TROPONINI in the last 168 hours. BNP: Invalid input(s): POCBNP CBG: No results for input(s): GLUCAP in the last 168  hours. D-Dimer No results for input(s): DDIMER in the last 72 hours. Hgb A1c No results for input(s): HGBA1C in the last 72 hours. Lipid Profile No results for input(s): CHOL, HDL, LDLCALC, TRIG, CHOLHDL, LDLDIRECT in the last 72 hours. Thyroid function studies Recent Labs    02/22/18 1515  TSH 1.062   Anemia work up No results for input(s): VITAMINB12, FOLATE, FERRITIN, TIBC, IRON, RETICCTPCT in the last 72 hours. Urinalysis    Component Value Date/Time   COLORURINE AMBER (A) 02/22/2018 1050   APPEARANCEUR TURBID (A) 02/22/2018 1050   LABSPEC 1.030 02/22/2018 1050   PHURINE 5.0 02/22/2018 1050   GLUCOSEU NEGATIVE 02/22/2018 1050   HGBUR LARGE (A) 02/22/2018 1050   HGBUR negative 05/12/2008 0957   BILIRUBINUR NEGATIVE 02/22/2018 1050   KETONESUR NEGATIVE 02/22/2018 1050   PROTEINUR 100 (A) 02/22/2018 1050   UROBILINOGEN 0.2 07/31/2015 2311   NITRITE NEGATIVE 02/22/2018 1050   LEUKOCYTESUR MODERATE (A) 02/22/2018 1050   Sepsis Labs Invalid input(s): PROCALCITONIN,  WBC,  LACTICIDVEN Microbiology Recent Results (from the past 240 hour(s))  Culture, blood (Routine x 2)     Status: None (Preliminary result)   Collection Time: 02/22/18 10:15 AM  Result Value Ref Range Status   Specimen Description BLOOD RIGHT HAND  Final   Special Requests   Final    BOTTLES DRAWN AEROBIC AND ANAEROBIC Blood Culture results may not be optimal due to an inadequate volume of blood received in culture bottles   Culture   Final    NO GROWTH 2 DAYS Performed at [facility], [place] [address], [place], [place] [address]    Report Status PENDING  Incomplete  Culture, blood (Routine x 2)     Status: None (Preliminary result)   Collection Time: 02/22/18 10:35 AM  Result Value Ref Range Status   Specimen Description BLOOD LEFT HAND  Final   Special Requests   Final    BOTTLES DRAWN AEROBIC AND ANAEROBIC Blood Culture results may not be optimal due to an inadequate volume of blood received in  culture bottles   Culture   Final    NO GROWTH 2 DAYS Performed at [facility], [place] [address], [place], [place] [address]    Report Status PENDING  Incomplete  Urine Culture     Status: None   Collection Time: 02/22/18  2:35 PM  Result Value Ref Range Status   Specimen Description URINE, CLEAN CATCH  Final   Special Requests NONE  Final   Culture   Final    NO GROWTH Performed at [facility], [address] [place], [place] [address]    Report Status 02/24/2018 FINAL  Final     Time coordinating discharge: Over 30 minutes  SIGNED:   Elmarie Shiley, MD  [facility] 02/24/2018, 1:55 PM Pager   If 7PM-7AM, please contact night-coverage [email or web address] Password [email or web address]

## 2018-02-25 DIAGNOSIS — N390 Urinary tract infection, site not specified: Secondary | ICD-10-CM | POA: Diagnosis not present

## 2018-02-25 DIAGNOSIS — D649 Anemia, unspecified: Secondary | ICD-10-CM | POA: Diagnosis not present

## 2018-02-25 DIAGNOSIS — T8620 Unspecified complication of heart transplant: Secondary | ICD-10-CM | POA: Diagnosis not present

## 2018-02-25 LAB — T3, FREE: T3, Free: 1.8 pg/mL — ABNORMAL LOW (ref 2.0–4.4)

## 2018-02-27 LAB — CULTURE, BLOOD (ROUTINE X 2)
CULTURE: NO GROWTH
CULTURE: NO GROWTH

## 2018-03-05 DIAGNOSIS — N142 Nephropathy induced by unspecified drug, medicament or biological substance: Secondary | ICD-10-CM | POA: Diagnosis not present

## 2018-03-05 DIAGNOSIS — D8989 Other specified disorders involving the immune mechanism, not elsewhere classified: Secondary | ICD-10-CM | POA: Diagnosis not present

## 2018-03-05 DIAGNOSIS — R300 Dysuria: Secondary | ICD-10-CM | POA: Diagnosis not present

## 2018-03-05 DIAGNOSIS — N390 Urinary tract infection, site not specified: Secondary | ICD-10-CM | POA: Diagnosis not present

## 2018-03-05 DIAGNOSIS — Z941 Heart transplant status: Secondary | ICD-10-CM | POA: Diagnosis not present

## 2018-03-05 DIAGNOSIS — N183 Chronic kidney disease, stage 3 (moderate): Secondary | ICD-10-CM | POA: Diagnosis not present

## 2018-03-08 DIAGNOSIS — N401 Enlarged prostate with lower urinary tract symptoms: Secondary | ICD-10-CM | POA: Diagnosis not present

## 2018-03-08 DIAGNOSIS — R350 Frequency of micturition: Secondary | ICD-10-CM | POA: Diagnosis not present

## 2018-03-08 DIAGNOSIS — R310 Gross hematuria: Secondary | ICD-10-CM | POA: Diagnosis not present

## 2018-03-15 DIAGNOSIS — Z941 Heart transplant status: Secondary | ICD-10-CM | POA: Diagnosis not present

## 2018-03-18 DIAGNOSIS — Z941 Heart transplant status: Secondary | ICD-10-CM | POA: Diagnosis not present

## 2018-03-18 DIAGNOSIS — Z48298 Encounter for aftercare following other organ transplant: Secondary | ICD-10-CM | POA: Diagnosis not present

## 2018-03-18 DIAGNOSIS — Z7952 Long term (current) use of systemic steroids: Secondary | ICD-10-CM | POA: Diagnosis not present

## 2018-03-29 ENCOUNTER — Other Ambulatory Visit: Payer: Self-pay | Admitting: Gastroenterology

## 2018-03-29 DIAGNOSIS — Z86010 Personal history of colonic polyps: Secondary | ICD-10-CM | POA: Diagnosis not present

## 2018-03-29 DIAGNOSIS — K219 Gastro-esophageal reflux disease without esophagitis: Secondary | ICD-10-CM | POA: Diagnosis not present

## 2018-04-05 DIAGNOSIS — M542 Cervicalgia: Secondary | ICD-10-CM | POA: Diagnosis not present

## 2018-04-06 DIAGNOSIS — E559 Vitamin D deficiency, unspecified: Secondary | ICD-10-CM | POA: Diagnosis not present

## 2018-04-12 DIAGNOSIS — R5382 Chronic fatigue, unspecified: Secondary | ICD-10-CM | POA: Diagnosis not present

## 2018-04-12 DIAGNOSIS — Z8739 Personal history of other diseases of the musculoskeletal system and connective tissue: Secondary | ICD-10-CM | POA: Diagnosis not present

## 2018-04-12 DIAGNOSIS — M2550 Pain in unspecified joint: Secondary | ICD-10-CM | POA: Diagnosis not present

## 2018-04-15 DIAGNOSIS — E559 Vitamin D deficiency, unspecified: Secondary | ICD-10-CM | POA: Diagnosis not present

## 2018-04-15 DIAGNOSIS — M8580 Other specified disorders of bone density and structure, unspecified site: Secondary | ICD-10-CM | POA: Diagnosis not present

## 2018-04-21 ENCOUNTER — Ambulatory Visit (HOSPITAL_COMMUNITY)
Admission: RE | Admit: 2018-04-21 | Discharge: 2018-04-21 | Disposition: A | Discharge status: Home / Self Care | Payer: 59 | Source: Ambulatory Visit | Attending: Internal Medicine | Admitting: Internal Medicine

## 2018-04-21 ENCOUNTER — Other Ambulatory Visit: Payer: Self-pay

## 2018-04-21 ENCOUNTER — Encounter (HOSPITAL_COMMUNITY): Payer: Self-pay | Admitting: Internal Medicine

## 2018-04-21 VITALS — BP 118/69 | HR 81 | Wt 221.4 lb

## 2018-04-21 DIAGNOSIS — I429 Cardiomyopathy, unspecified: Secondary | ICD-10-CM | POA: Insufficient documentation

## 2018-04-21 DIAGNOSIS — Z79899 Other long term (current) drug therapy: Secondary | ICD-10-CM | POA: Insufficient documentation

## 2018-04-21 DIAGNOSIS — Z7952 Long term (current) use of systemic steroids: Secondary | ICD-10-CM | POA: Insufficient documentation

## 2018-04-21 DIAGNOSIS — Z87442 Personal history of urinary calculi: Secondary | ICD-10-CM | POA: Diagnosis not present

## 2018-04-21 DIAGNOSIS — Z7982 Long term (current) use of aspirin: Secondary | ICD-10-CM | POA: Insufficient documentation

## 2018-04-21 DIAGNOSIS — J45909 Unspecified asthma, uncomplicated: Secondary | ICD-10-CM | POA: Insufficient documentation

## 2018-04-21 DIAGNOSIS — I5022 Chronic systolic (congestive) heart failure: Secondary | ICD-10-CM | POA: Insufficient documentation

## 2018-04-21 DIAGNOSIS — Z8571 Personal history of Hodgkin lymphoma: Secondary | ICD-10-CM | POA: Diagnosis not present

## 2018-04-21 DIAGNOSIS — Z923 Personal history of irradiation: Secondary | ICD-10-CM | POA: Diagnosis not present

## 2018-04-21 DIAGNOSIS — I4891 Unspecified atrial fibrillation: Secondary | ICD-10-CM | POA: Insufficient documentation

## 2018-04-21 DIAGNOSIS — Z941 Heart transplant status: Secondary | ICD-10-CM | POA: Diagnosis not present

## 2018-04-21 DIAGNOSIS — F329 Major depressive disorder, single episode, unspecified: Secondary | ICD-10-CM | POA: Insufficient documentation

## 2018-04-21 DIAGNOSIS — R5383 Other fatigue: Secondary | ICD-10-CM | POA: Diagnosis not present

## 2018-04-21 DIAGNOSIS — E039 Hypothyroidism, unspecified: Secondary | ICD-10-CM | POA: Insufficient documentation

## 2018-04-21 DIAGNOSIS — K219 Gastro-esophageal reflux disease without esophagitis: Secondary | ICD-10-CM | POA: Diagnosis not present

## 2018-04-21 NOTE — Addendum Note (Signed)
Encounter addended by: Scarlette Calico, RN on: 04/21/2018 10:41 AM  Actions taken: Sign clinical note

## 2018-04-21 NOTE — Progress Notes (Signed)
ADVANCED HF CLINIC NOTE  Patient ID: Glenn Gonzalez, male   DOB: 09/04/1961, 57 y.o.   MRN: [id number] PCP: Dr Glenn Gonzalez AHF Cardiologist: Glenn Gonzalez  HPI: Glenn Gonzalez is a 57 y/o male with long h/o CHF due to NICM (? related to chemo/XRT) with EF 25% s/p CRT-D ([employer] [facility]) s/o OHTx 06/20/2016  PMHx notable for Hodgkins lymphona (treated age 78-23) Treated with high dose chemo - including adriamycin and XRT. Also has AF, VT, PUD and asthma. Initial EF 13%.   Underwent OHtx in 06/20/16  Follow up for HF:  Had rocky course post transplant with memory fog and AKI with creatinine up to 2.4. Las creatininee was 1.5. No problems with rejection since. Following with allomap. Doing elliptical for 59mins/3x per week. If he does more the fatigue gets much worse. Edema well controlled. Has some orthostasis.    ROS: All systems negative except as listed in HPI, PMH and Problem List.  Past Medical History:  Diagnosis Date  . A-fib ([place])   . Asthma    pt. denies  . Depression   . GERD (gastroesophageal reflux disease)   . History of kidney stones   . History of radiation therapy   . Hodgkin's lymphoma ([place]) 1980   IIIB. 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  . Hypothyroidism   . Pneumonia    recurrent pneumonia sedf.  rad tx for lymphoma  . Shortness of breath dyspnea    with exertion  . Status post heart transplantation [facility])     Current Outpatient Medications  Medication Sig Dispense Refill  . acetaminophen (TYLENOL) 650 MG CR tablet Take 1,300 mg by mouth at bedtime.     Marland Kitchen allopurinol (ZYLOPRIM) 100 MG tablet Take 200 mg by mouth daily.   10  . aspirin EC 81 MG tablet Take 81 mg by mouth daily.    . calcium carbonate (OSCAL) 1500 (600 Ca) MG TABS tablet Take 1,500 mg by mouth 2 (two) times daily with a meal. Separated from transplant medications.    . cetirizine (ZYRTEC) 10 MG tablet Take 10 mg by mouth at bedtime.     Marland Kitchen ENVARSUS XR 1 MG TB24 Take 2 mg by mouth daily.    .  fluticasone (FLONASE) 50 MCG/ACT nasal spray Place 1 spray into both nostrils as needed for allergies or rhinitis.    . furosemide (LASIX) 40 MG tablet Take 20 mg by mouth daily as needed for fluid. As needed based upon weight and fluid status    . levothyroxine (SYNTHROID, LEVOTHROID) 125 MCG tablet Take 125 mcg by mouth every morning.    . magnesium oxide (MAG-OX) 400 MG tablet Take 400 mg by mouth 2 (two) times daily. Separates from transplant meds. Takes with Ca/Vit D.    . mycophenolate (CELLCEPT) 500 MG tablet Take 1,000 mg by mouth every 12 (twelve) hours.    . polyethylene glycol (MIRALAX / GLYCOLAX) packet Take 17 g by mouth daily as needed for mild constipation.    . predniSONE (DELTASONE) 2.5 MG tablet Take 1 tablet (2.5 mg total) by mouth daily. 20 tablet 0  . RABEprazole (ACIPHEX) 20 MG tablet Take 1 tablet (20 mg total) by mouth 2 (two) times daily. 180 tablet 3  . tamsulosin (FLOMAX) 0.4 MG CAPS capsule Take 0.4 mg by mouth.    . valACYclovir (VALTREX) 500 MG tablet Take 500 mg by mouth every 12 (twelve) hours.     No current facility-administered medications for this encounter.  Vitals:   04/21/18 1006  BP: 118/69  Pulse: 81  SpO2: 98%  Weight: 221 lb 6.4 oz (100.4 kg)   Wt Readings from Last 3 Encounters:  04/21/18 221 lb 6.4 oz (100.4 kg)  02/22/18 218 lb (98.9 kg)  12/22/16 200 lb 2.8 oz (90.8 kg)     PHYSICAL EXAM: General:  Well appearing. No resp difficulty HEENT: normal Neck: supple. no JVD. Carotids 2+ bilat; no bruits. No lymphadenopathy or thryomegaly appreciated. Cor: PMI nondisplaced. Regular rate & rhythm. No rubs, gallops or murmurs. Lungs: clear Abdomen: soft, nontender, nondistended. No hepatosplenomegaly. No bruits or masses. Good bowel sounds. Extremities: no cyanosis, clubbing, rash, edema Neuro: alert & orientedx3, cranial nerves grossly intact. moves all 4 extremities w/o difficulty. Affect pleasant   ASSESSMENT & PLAN:  1. Chronic  systolic CHF: NICM, EF 30% (04/2015), thought to be from chemo (adriamycin), s/p [employer] CRT-D. - s/p OHTx 7/17  - has some exertional fatigue. Will d/w [facility] regarding doing f/u CPX. 2. History of lymphoma treated ( Age 46-23)  with adriamycin 3. Hypothyroid: On synthroid. Per PCP Dr Glenn Gonzalez.   Glenn Bickers, MD  10:26 AM

## 2018-04-21 NOTE — Patient Instructions (Signed)
We will contact you in 1 year to schedule your next appointment.  

## 2018-04-22 ENCOUNTER — Other Ambulatory Visit (HOSPITAL_COMMUNITY): Payer: Self-pay | Admitting: *Deleted

## 2018-04-22 ENCOUNTER — Ambulatory Visit (HOSPITAL_COMMUNITY): Payer: 59 | Attending: Cardiology

## 2018-04-22 DIAGNOSIS — I509 Heart failure, unspecified: Secondary | ICD-10-CM

## 2018-04-27 DIAGNOSIS — R3912 Poor urinary stream: Secondary | ICD-10-CM | POA: Diagnosis not present

## 2018-04-27 DIAGNOSIS — N401 Enlarged prostate with lower urinary tract symptoms: Secondary | ICD-10-CM | POA: Diagnosis not present

## 2018-06-04 ENCOUNTER — Ambulatory Visit
Admission: RE | Admit: 2018-06-04 | Discharge: 2018-06-04 | Disposition: A | Discharge status: Home / Self Care | Payer: 59 | Source: Ambulatory Visit | Attending: Geriatric Medicine | Admitting: Geriatric Medicine

## 2018-06-04 ENCOUNTER — Other Ambulatory Visit: Payer: Self-pay | Admitting: Geriatric Medicine

## 2018-06-04 DIAGNOSIS — J309 Allergic rhinitis, unspecified: Secondary | ICD-10-CM | POA: Diagnosis not present

## 2018-06-04 DIAGNOSIS — R05 Cough: Secondary | ICD-10-CM

## 2018-06-04 DIAGNOSIS — R059 Cough, unspecified: Secondary | ICD-10-CM

## 2018-06-07 DIAGNOSIS — J329 Chronic sinusitis, unspecified: Secondary | ICD-10-CM | POA: Diagnosis not present

## 2018-06-11 ENCOUNTER — Encounter: Payer: Self-pay | Admitting: Cardiology

## 2018-06-23 DIAGNOSIS — I071 Rheumatic tricuspid insufficiency: Secondary | ICD-10-CM | POA: Diagnosis not present

## 2018-06-23 DIAGNOSIS — Z941 Heart transplant status: Secondary | ICD-10-CM | POA: Diagnosis not present

## 2018-06-23 DIAGNOSIS — D899 Disorder involving the immune mechanism, unspecified: Secondary | ICD-10-CM | POA: Diagnosis not present

## 2018-06-23 DIAGNOSIS — Z4821 Encounter for aftercare following heart transplant: Secondary | ICD-10-CM | POA: Diagnosis not present

## 2018-06-23 DIAGNOSIS — I1310 Hypertensive heart and chronic kidney disease without heart failure, with stage 1 through stage 4 chronic kidney disease, or unspecified chronic kidney disease: Secondary | ICD-10-CM | POA: Diagnosis not present

## 2018-06-23 DIAGNOSIS — Z48298 Encounter for aftercare following other organ transplant: Secondary | ICD-10-CM | POA: Diagnosis not present

## 2018-06-30 ENCOUNTER — Encounter (HOSPITAL_COMMUNITY): Payer: Self-pay

## 2018-07-02 ENCOUNTER — Other Ambulatory Visit: Payer: Self-pay

## 2018-07-02 ENCOUNTER — Encounter (HOSPITAL_COMMUNITY)
Admission: RE | Disposition: A | Discharge status: Home / Self Care | Payer: Self-pay | Source: Ambulatory Visit | Attending: Gastroenterology

## 2018-07-02 ENCOUNTER — Ambulatory Visit (HOSPITAL_COMMUNITY): Payer: 59 | Admitting: Anesthesiology

## 2018-07-02 ENCOUNTER — Ambulatory Visit (HOSPITAL_COMMUNITY)
Admission: RE | Admit: 2018-07-02 | Discharge: 2018-07-02 | Disposition: A | Discharge status: Home / Self Care | Payer: 59 | Source: Ambulatory Visit | Attending: Gastroenterology | Admitting: Gastroenterology

## 2018-07-02 ENCOUNTER — Encounter (HOSPITAL_COMMUNITY): Payer: Self-pay | Admitting: *Deleted

## 2018-07-02 DIAGNOSIS — Z888 Allergy status to other drugs, medicaments and biological substances status: Secondary | ICD-10-CM | POA: Diagnosis not present

## 2018-07-02 DIAGNOSIS — Z8571 Personal history of Hodgkin lymphoma: Secondary | ICD-10-CM | POA: Insufficient documentation

## 2018-07-02 DIAGNOSIS — Z882 Allergy status to sulfonamides status: Secondary | ICD-10-CM | POA: Diagnosis not present

## 2018-07-02 DIAGNOSIS — K621 Rectal polyp: Secondary | ICD-10-CM | POA: Diagnosis not present

## 2018-07-02 DIAGNOSIS — Z820 Family history of epilepsy and other diseases of the nervous system: Secondary | ICD-10-CM | POA: Diagnosis not present

## 2018-07-02 DIAGNOSIS — E039 Hypothyroidism, unspecified: Secondary | ICD-10-CM | POA: Insufficient documentation

## 2018-07-02 DIAGNOSIS — Z880 Allergy status to penicillin: Secondary | ICD-10-CM | POA: Diagnosis not present

## 2018-07-02 DIAGNOSIS — Z923 Personal history of irradiation: Secondary | ICD-10-CM | POA: Diagnosis not present

## 2018-07-02 DIAGNOSIS — Z941 Heart transplant status: Secondary | ICD-10-CM | POA: Diagnosis not present

## 2018-07-02 DIAGNOSIS — Z950 Presence of cardiac pacemaker: Secondary | ICD-10-CM | POA: Insufficient documentation

## 2018-07-02 DIAGNOSIS — F329 Major depressive disorder, single episode, unspecified: Secondary | ICD-10-CM | POA: Diagnosis not present

## 2018-07-02 DIAGNOSIS — K219 Gastro-esophageal reflux disease without esophagitis: Secondary | ICD-10-CM | POA: Insufficient documentation

## 2018-07-02 DIAGNOSIS — Z87442 Personal history of urinary calculi: Secondary | ICD-10-CM | POA: Insufficient documentation

## 2018-07-02 DIAGNOSIS — R0602 Shortness of breath: Secondary | ICD-10-CM | POA: Insufficient documentation

## 2018-07-02 DIAGNOSIS — D122 Benign neoplasm of ascending colon: Secondary | ICD-10-CM | POA: Insufficient documentation

## 2018-07-02 DIAGNOSIS — J45909 Unspecified asthma, uncomplicated: Secondary | ICD-10-CM | POA: Diagnosis not present

## 2018-07-02 DIAGNOSIS — Z86010 Personal history of colonic polyps: Secondary | ICD-10-CM | POA: Diagnosis not present

## 2018-07-02 DIAGNOSIS — C20 Malignant neoplasm of rectum: Secondary | ICD-10-CM | POA: Diagnosis not present

## 2018-07-02 DIAGNOSIS — Z1211 Encounter for screening for malignant neoplasm of colon: Secondary | ICD-10-CM | POA: Diagnosis not present

## 2018-07-02 DIAGNOSIS — E739 Lactose intolerance, unspecified: Secondary | ICD-10-CM | POA: Insufficient documentation

## 2018-07-02 DIAGNOSIS — Z818 Family history of other mental and behavioral disorders: Secondary | ICD-10-CM | POA: Diagnosis not present

## 2018-07-02 DIAGNOSIS — I4891 Unspecified atrial fibrillation: Secondary | ICD-10-CM | POA: Diagnosis not present

## 2018-07-02 DIAGNOSIS — Z8249 Family history of ischemic heart disease and other diseases of the circulatory system: Secondary | ICD-10-CM | POA: Insufficient documentation

## 2018-07-02 HISTORY — PX: SUBMUCOSAL INJECTION: SHX5543

## 2018-07-02 HISTORY — PX: COLONOSCOPY: SHX5424

## 2018-07-02 HISTORY — PX: HOT HEMOSTASIS: SHX5433

## 2018-07-02 HISTORY — PX: POLYPECTOMY: SHX5525

## 2018-07-02 HISTORY — PX: BIOPSY: SHX5522

## 2018-07-02 SURGERY — COLONOSCOPY
Anesthesia: Monitor Anesthesia Care

## 2018-07-02 MED ORDER — PROPOFOL 10 MG/ML IV BOLUS
INTRAVENOUS | Status: AC
  Filled 2018-07-02: qty 60

## 2018-07-02 MED ORDER — SODIUM CHLORIDE 0.9 % IV SOLN
INTRAVENOUS | Status: DC
  Administered 2018-07-02: 09:00:00 via INTRAVENOUS

## 2018-07-02 MED ORDER — EPINEPHRINE PF 1 MG/10ML IJ SOSY
PREFILLED_SYRINGE | INTRAMUSCULAR | Status: AC
  Filled 2018-07-02: qty 10

## 2018-07-02 MED ORDER — SODIUM CHLORIDE 0.9 % IJ SOLN
PREFILLED_SYRINGE | INTRAMUSCULAR | Status: DC | PRN
  Administered 2018-07-02: 2 mL

## 2018-07-02 NOTE — H&P (Signed)
Glenn Gonzalez HPI: His colonoscopy on 08/03/2015 was significant for 13 adenomas. He was recommended to have a repeat colonoscopy in one year. The procedure was performed without sedation at the hospital per his request and the procedure was without complications. He was recently hospitalized on 02/24/2018 for sepsis secondary to a prostatitis. The patient was thought to have a UTI sepsis, but follow up with a urologist changed the diagnosis. Currently, he is one week out of treatment with doxycycline. He is s/p heart transplant on 06/20/2016.   Past Medical History:  Diagnosis Date  . A-fib ([place])   . Asthma    pt. denies  . Depression   . GERD (gastroesophageal reflux disease)   . History of kidney stones   . History of radiation therapy   . Hodgkin's lymphoma ([place]) 1980   IIIB. 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  . Hypothyroidism   . Pneumonia    recurrent pneumonia sedf.  rad tx for lymphoma  . Shortness of breath dyspnea    with exertion  . Status post heart transplantation [facility])     Past Surgical History:  Procedure Laterality Date  . CARDIAC CATHETERIZATION N/A 04/20/2015   Procedure: Right/Left Heart Cath and Coronary Angiography;  Surgeon: Jolaine Artist, MD;  Location: [place] CV LAB;  Service: Cardiovascular;  Laterality: N/A;  . CARDIAC CATHETERIZATION  10-11-15   pt states routine heart cath to be done at [facility]   . CARDIAC CATHETERIZATION N/A 04/02/2016   Procedure: Right Heart Cath;  Surgeon: Jolaine Artist, MD;  Location: [place] CV LAB;  Service: Cardiovascular;  Laterality: N/A;  . COLONOSCOPY WITH PROPOFOL N/A 08/03/2015   Procedure: COLONOSCOPY WITH PROPOFOL;  Surgeon: Carol Ada, MD;  [facility];  Service: Endoscopy;  Laterality: N/A;  wants to try to do procedure without sedation  . CYSTOSCOPY WITH RETROGRADE PYELOGRAM, URETEROSCOPY AND STENT PLACEMENT Right 10/12/2015   Procedure: CYSTOSCOPY WITH RETROGRADE PYELOGRAM,  URETEROSCOPY AND STENT PLACEMENT;  Surgeon: Cleon Gustin, MD;  [facility];  Service: Urology;  Laterality: Right;  . ESOPHAGOGASTRODUODENOSCOPY    . EXPLORATORY LAPAROTOMY    . gynemastia Bilateral   . HEART TRANSPLANT  06/20/2016   at [facility]  . LUMBAR LAMINECTOMY/DECOMPRESSION MICRODISCECTOMY Right 01/04/2016   Procedure: Microdiscectomy Lumbar four Lumbar five right;  Surgeon: Eustace Moore, MD;  Location: [place] NEURO ORS;  Service: Neurosurgery;  Laterality: Right;  . neck biopses     x5  . permanent pacemaker  2011   [employer] COGNIS device  . PORTACATH PLACEMENT     06-29-15 inserted, now removed.  Marland Kitchen STONE EXTRACTION WITH BASKET Right 10/12/2015   Procedure: STONE EXTRACTION WITH BASKET;  Surgeon: Cleon Gustin, MD;  [facility];  Service: Urology;  Laterality: Right;  . TONSILLECTOMY      Family History  Problem Relation Age of Onset  . Hyperthyroidism Mother   . Insomnia Mother   . Hypertension Father   . Depression Father   . Insomnia Father     Social History:  reports that he has never smoked. He has never used smokeless tobacco. He reports that he does not drink alcohol or use drugs.  Allergies:  Allergies  Allergen Reactions  . Digoxin Other (See Comments)    gynecomastia   . Phencyclidine Other (See Comments)    PCP derived antibiotic > Insomnia  . Spironolactone Other (See Comments)    Gynecomastia  . Lactose Intolerance (Gi) Diarrhea  and Other (See Comments)    cramps  . Milk-Related Compounds Other (See Comments)    Cramps and diarrhea Lactose intolerance   . Penicillins Other (See Comments)    Cramps Has patient had a PCN reaction causing immediate rash, facial/tongue/throat swelling, SOB or lightheadedness with hypotension: No Has patient had a PCN reaction causing severe rash involving mucus membranes or skin necrosis: No Has patient had a PCN reaction that required hospitalization No Has patient had a PCN reaction occurring  within the last 10 years: No If all of the above answers are "NO", then may proceed with Cephalosporin use.    . Sulfur Rash    Medications: Scheduled: Continuous:  No results found for this or any previous visit (from the past 24 hour(s)).   No results found.  ROS:  As stated above in the HPI otherwise negative.  Blood pressure 138/83, pulse 90, temperature 97.6 F (36.4 C), temperature source Oral, resp. rate 17, height 6\' 2"  (1.88 m), weight 95.3 kg (210 lb), SpO2 100 %.    PE: Gen: NAD, Alert and Oriented HEENT:  [place]/AT, EOMI Neck: Supple, no LAD Lungs: CTA Bilaterally CV: RRR without M/G/R ABM: Soft, NTND, +BS Ext: No C/C/E  Assessment/Plan: 1) Personal history of polyps - colonoscopy.  Glenn Gonzalez D 07/02/2018, 8:39 AM

## 2018-07-02 NOTE — Discharge Instructions (Signed)

## 2018-07-02 NOTE — Op Note (Signed)
[facility] Patient Name: Glenn Gonzalez Procedure Date: 07/02/2018 MRN: [id number] Attending MD: Carol Ada , MD Date of Birth: [birth year] CSN: [id number] Age: 57 Admit Type: Outpatient Procedure:                Colonoscopy Indications:              High risk colon cancer surveillance: Personal                            history of colonic polyps Providers:                Carol Ada, MD, Baird Cancer, RN, Laurena Spies, Technician Referring MD:              Medicines:                None Complications:            No immediate complications. Estimated Blood Loss:     Estimated blood loss was minimal. Procedure:                Pre-Anesthesia Assessment:                           - Prior to the procedure, a History and Physical                            was performed, and patient medications and                            allergies were reviewed. The patient's tolerance of                            previous anesthesia was also reviewed. The risks                            and benefits of the procedure and the sedation                            options and risks were discussed with the patient.                            All questions were answered, and informed consent                            was obtained. Prior Anticoagulants: The patient has                            taken no previous anticoagulant or antiplatelet                            agents. ASA Grade Assessment: III - A patient with                            severe systemic disease.  After reviewing the risks                            and benefits, the patient was deemed in                            satisfactory condition to undergo the procedure.                           After obtaining informed consent, the colonoscope                            was passed under direct vision. Throughout the                            procedure, the patient's blood pressure,  pulse, and                            oxygen saturations were monitored continuously. The                            PCF-[id number] ([id number] Olympus peds colonscope was                            introduced through the anus and advanced to the the                            cecum, identified by appendiceal orifice and                            ileocecal valve. The colonoscopy was technically                            difficult and complex. The patient tolerated the                            procedure well. The quality of the bowel                            preparation was excellent. The ileocecal valve,                            appendiceal orifice, and rectum were photographed. Scope In: 9:41:37 AM Scope Out: 10:43:23 AM Scope Withdrawal Time: 0 hours 42 minutes 7 seconds  Total Procedure Duration: 1 hour 1 minute 46 seconds  Findings:      Five sessile polyps were found in the rectum and ascending colon. The       polyps were 2 to 3 mm in size. These polyps were removed with a cold       snare. Resection and retrieval were complete.      A 15 mm polyp was found in the rectum. The polyp was sessile. The polyp       was removed with a hot snare. The polyp was removed with a saline       injection-lift technique  using a hot snare. The polyp was removed with a       piecemeal technique using a hot snare. Resection and retrieval were       complete. To prevent bleeding post-intervention, three hemostatic clips       were successfully placed (MR unsafe). There was no bleeding at the end       of the procedure.      A 1.5 cm rectal polyp was identified. An adequate image was not obtained       as there was persistent peristalsis at the time of identification and       removal. The polyp was removed in a piecemeal fashion and any suspicious       residual edges were cauterized with the tip of the snare. Two ml of       [place] and 2 ml of 1:10,000 epi was injected. The polyp was friable        with manipulation and oozing made it difficult to visualize the field of       view. Epi controlled the oozing and allowed for a clear visualization. Impression:               - Five 2 to 3 mm polyps in the rectum and in the                            ascending colon, removed with a cold snare.                            Resected and retrieved.                           - One 15 mm polyp in the rectum, removed with a hot                            snare, removed using injection-lift and a hot snare                            and removed piecemeal using a hot snare. Resected                            and retrieved. Clips (MR unsafe) were placed. Moderate Sedation:      N/A- Per Anesthesia Care Recommendation:           - Patient has a contact number available for                            emergencies. The signs and symptoms of potential                            delayed complications were discussed with the                            patient. Return to normal activities tomorrow.                            Written discharge instructions were provided to the  patient.                           - Resume previous diet.                           - Continue present medications.                           - Await pathology results.                           - Repeat colonoscopy in 3 years for surveillance.                           - Flexible sigmoidoscopy 1 year. Procedure Code(s):        --- Professional ---                           [phone], Colonoscopy, flexible; with removal of                            tumor(s), polyp(s), or other lesion(s) by snare                            technique                           [id number], Colonoscopy, flexible; with directed                            submucosal injection(s), any substance Diagnosis Code(s):        --- Professional ---                           K62.1, Rectal polyp                           Z86.010, Personal  history of colonic polyps                           D12.2, Benign neoplasm of ascending colon CPT copyright 2017 American Medical Association. All rights reserved. The codes documented in this report are preliminary and upon coder review may  be revised to meet current compliance requirements. Carol Ada, MD Carol Ada, MD 07/02/2018 10:51:48 AM This report has been signed electronically. Number of Addenda: 0

## 2018-07-02 NOTE — Anesthesia Preprocedure Evaluation (Deleted)
Anesthesia Evaluation  Patient identified by MRN, date of birth, ID band Patient awake    Reviewed: Allergy & Precautions, NPO status , Patient's Chart, lab work & pertinent test results  Airway Mallampati: II  TM Distance: >3 FB Neck ROM: Full    Dental  (+) Teeth Intact   Pulmonary shortness of breath and with exertion, asthma , pneumonia, resolved,    Pulmonary exam normal        Cardiovascular Normal cardiovascular exam Rhythm:Regular Rate:Normal  Hx/o non ischemic CM S/P Heart transplant Hx/o Atrial fibrillation and V. Tach- none since transplant Hx/o AICD placement EKG- Sinus rhythm incomplete RBBB pattern   Neuro/Psych PSYCHIATRIC DISORDERS Depression negative neurological ROS     GI/Hepatic Neg liver ROS, GERD  Medicated and Controlled,Screening colonoscopy   Endo/Other  Hypothyroidism   Renal/GU ARFRenal diseaseHx/o AKI - normal renal function currently  negative genitourinary   Musculoskeletal negative musculoskeletal ROS (+)   Abdominal   Peds  Hematology  (+) anemia , Hx/o Non Hodgkin's lymphoma 30 years ago S/P RT   Anesthesia Other Findings   Reproductive/Obstetrics                             Anesthesia Physical Anesthesia Plan  ASA: III  Anesthesia Plan: MAC   Post-op Pain Management:    Induction: Intravenous  PONV Risk Score and Plan: 1 and Propofol infusion, Ondansetron and Treatment may vary due to age or medical condition  Airway Management Planned: Natural Airway, Nasal Cannula and Simple Face Mask  Additional Equipment:   Intra-op Plan:   Post-operative Plan:   Informed Consent: I have reviewed the patients History and Physical, chart, labs and discussed the procedure including the risks, benefits and alternatives for the proposed anesthesia with the patient or authorized representative who has indicated his/her understanding and acceptance.   Dental  advisory given  Plan Discussed with: CRNA and Surgeon  Anesthesia Plan Comments:         Anesthesia Quick Evaluation

## 2018-07-05 DIAGNOSIS — D492 Neoplasm of unspecified behavior of bone, soft tissue, and skin: Secondary | ICD-10-CM | POA: Diagnosis not present

## 2018-07-06 ENCOUNTER — Other Ambulatory Visit: Payer: Self-pay | Admitting: Gastroenterology

## 2018-07-06 DIAGNOSIS — R933 Abnormal findings on diagnostic imaging of other parts of digestive tract: Secondary | ICD-10-CM

## 2018-07-07 DIAGNOSIS — R3912 Poor urinary stream: Secondary | ICD-10-CM | POA: Diagnosis not present

## 2018-07-07 DIAGNOSIS — N401 Enlarged prostate with lower urinary tract symptoms: Secondary | ICD-10-CM | POA: Diagnosis not present

## 2018-07-20 ENCOUNTER — Ambulatory Visit
Admission: RE | Admit: 2018-07-20 | Discharge: 2018-07-20 | Disposition: A | Discharge status: Home / Self Care | Payer: 59 | Source: Ambulatory Visit | Attending: Gastroenterology | Admitting: Gastroenterology

## 2018-07-20 DIAGNOSIS — K5730 Diverticulosis of large intestine without perforation or abscess without bleeding: Secondary | ICD-10-CM | POA: Diagnosis not present

## 2018-07-20 DIAGNOSIS — R933 Abnormal findings on diagnostic imaging of other parts of digestive tract: Secondary | ICD-10-CM

## 2018-07-20 MED ORDER — IOPAMIDOL (ISOVUE-300) INJECTION 61%
100.0000 mL | Freq: Once | INTRAVENOUS | Status: AC | PRN
  Administered 2018-07-20: 100 mL via INTRAVENOUS

## 2018-07-28 DIAGNOSIS — Z941 Heart transplant status: Secondary | ICD-10-CM | POA: Diagnosis not present

## 2018-08-05 DIAGNOSIS — N401 Enlarged prostate with lower urinary tract symptoms: Secondary | ICD-10-CM | POA: Diagnosis not present

## 2018-08-11 DIAGNOSIS — Z23 Encounter for immunization: Secondary | ICD-10-CM | POA: Diagnosis not present

## 2018-08-11 DIAGNOSIS — L819 Disorder of pigmentation, unspecified: Secondary | ICD-10-CM | POA: Diagnosis not present

## 2018-08-11 DIAGNOSIS — R5383 Other fatigue: Secondary | ICD-10-CM | POA: Diagnosis not present

## 2018-08-12 DIAGNOSIS — N401 Enlarged prostate with lower urinary tract symptoms: Secondary | ICD-10-CM | POA: Diagnosis not present

## 2018-08-12 DIAGNOSIS — R3912 Poor urinary stream: Secondary | ICD-10-CM | POA: Diagnosis not present

## 2018-08-30 DIAGNOSIS — D225 Melanocytic nevi of trunk: Secondary | ICD-10-CM | POA: Diagnosis not present

## 2018-08-30 DIAGNOSIS — L570 Actinic keratosis: Secondary | ICD-10-CM | POA: Diagnosis not present

## 2018-08-30 DIAGNOSIS — Z85828 Personal history of other malignant neoplasm of skin: Secondary | ICD-10-CM | POA: Diagnosis not present

## 2018-08-30 DIAGNOSIS — L821 Other seborrheic keratosis: Secondary | ICD-10-CM | POA: Diagnosis not present

## 2018-09-14 ENCOUNTER — Other Ambulatory Visit: Payer: Self-pay | Admitting: Urology

## 2018-09-15 NOTE — Progress Notes (Signed)
Reviewed pt chart w/ dr Fransisco Beau mda, face to face, including last cardiologist visit note, dated 06-23-2018 in epic and cardiology clearance received received via fax from dr Alyson Ingles office.  Per dr Fransisco Beau , ok to proceed.  lvm for selita , or scheduler, to let her know.

## 2018-10-12 ENCOUNTER — Encounter (HOSPITAL_BASED_OUTPATIENT_CLINIC_OR_DEPARTMENT_OTHER): Payer: Self-pay | Admitting: *Deleted

## 2018-10-12 ENCOUNTER — Other Ambulatory Visit: Payer: Self-pay

## 2018-10-12 NOTE — Progress Notes (Signed)
Spoke with Glenn Gonzalez Npo after midnight, food except 1 or 2 small cracksers with antirejections medications per dr allander anesthesia, clear liquids from midnight until 800 am, then npo. 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

## 2018-10-12 NOTE — Progress Notes (Signed)
SPOKE WITH DR Winfield Cunas ANESTHESIA AND PATIENT MAT TAKE ANTIREJECTION MEDICATIONS AT 600 AM DAY OF SURGERY WITH 1 OR 2 SMALL CRACKERS , CALLED PATIENT AND MADE AWARE TO TAKE ANTIREJECTIONS MEDICATIONS AT 600 AM DAY OF SURGERY WITH 1 OR 2 SMALL CRACKERS.

## 2018-10-18 ENCOUNTER — Ambulatory Visit (HOSPITAL_BASED_OUTPATIENT_CLINIC_OR_DEPARTMENT_OTHER): Payer: 59 | Admitting: Anesthesiology

## 2018-10-18 ENCOUNTER — Encounter (HOSPITAL_BASED_OUTPATIENT_CLINIC_OR_DEPARTMENT_OTHER)
Admission: RE | Disposition: A | Discharge status: Home / Self Care | Payer: Self-pay | Source: Ambulatory Visit | Attending: Urology

## 2018-10-18 ENCOUNTER — Encounter (HOSPITAL_BASED_OUTPATIENT_CLINIC_OR_DEPARTMENT_OTHER): Payer: Self-pay | Admitting: Anesthesiology

## 2018-10-18 ENCOUNTER — Ambulatory Visit (HOSPITAL_BASED_OUTPATIENT_CLINIC_OR_DEPARTMENT_OTHER)
Admission: RE | Admit: 2018-10-18 | Discharge: 2018-10-18 | Disposition: A | Discharge status: Home / Self Care | Payer: 59 | Source: Ambulatory Visit | Attending: Urology | Admitting: Urology

## 2018-10-18 DIAGNOSIS — N320 Bladder-neck obstruction: Secondary | ICD-10-CM | POA: Diagnosis not present

## 2018-10-18 DIAGNOSIS — N138 Other obstructive and reflux uropathy: Secondary | ICD-10-CM | POA: Insufficient documentation

## 2018-10-18 DIAGNOSIS — E039 Hypothyroidism, unspecified: Secondary | ICD-10-CM | POA: Diagnosis not present

## 2018-10-18 DIAGNOSIS — N189 Chronic kidney disease, unspecified: Secondary | ICD-10-CM | POA: Diagnosis not present

## 2018-10-18 DIAGNOSIS — N401 Enlarged prostate with lower urinary tract symptoms: Secondary | ICD-10-CM | POA: Diagnosis not present

## 2018-10-18 DIAGNOSIS — I4891 Unspecified atrial fibrillation: Secondary | ICD-10-CM | POA: Diagnosis not present

## 2018-10-18 HISTORY — DX: Chronic fatigue, unspecified: R53.82

## 2018-10-18 HISTORY — DX: Adverse effect of unspecified anesthetic, initial encounter: T41.45XA

## 2018-10-18 HISTORY — DX: Personal history of antineoplastic chemotherapy: Z92.21

## 2018-10-18 HISTORY — DX: Other complications of anesthesia, initial encounter: T88.59XA

## 2018-10-18 HISTORY — DX: Malignant neoplasm of colon, unspecified: C18.9

## 2018-10-18 HISTORY — PX: CYSTOSCOPY WITH INSERTION OF UROLIFT: SHX6678

## 2018-10-18 HISTORY — DX: Chronic kidney disease, unspecified: N18.9

## 2018-10-18 HISTORY — DX: Unspecified malignant neoplasm of skin, unspecified: C44.90

## 2018-10-18 LAB — POCT I-STAT, CHEM 8
BUN: 30 mg/dL — AB (ref 6–20)
CALCIUM ION: 1.21 mmol/L (ref 1.15–1.40)
CHLORIDE: 101 mmol/L (ref 98–111)
CREATININE: 1.3 mg/dL — AB (ref 0.61–1.24)
Glucose, Bld: 92 mg/dL (ref 70–99)
HCT: 36 % — ABNORMAL LOW (ref 39.0–52.0)
Hemoglobin: 12.2 g/dL — ABNORMAL LOW (ref 13.0–17.0)
Potassium: 4.1 mmol/L (ref 3.5–5.1)
Sodium: 138 mmol/L (ref 135–145)
TCO2: 27 mmol/L (ref 22–32)

## 2018-10-18 SURGERY — CYSTOSCOPY WITH INSERTION OF UROLIFT
Anesthesia: Monitor Anesthesia Care

## 2018-10-18 MED ORDER — CEFAZOLIN SODIUM 1 G IJ SOLR
INTRAMUSCULAR | Status: AC
  Filled 2018-10-18: qty 20

## 2018-10-18 MED ORDER — CEFUROXIME AXETIL 500 MG PO TABS: 500.0000 mg | ORAL_TABLET | Freq: Two times a day (BID) | ORAL | 0 refills | Status: AC

## 2018-10-18 MED ORDER — MIDAZOLAM HCL 5 MG/5ML IJ SOLN
INTRAMUSCULAR | Status: DC | PRN
  Administered 2018-10-18: 2 mg via INTRAVENOUS

## 2018-10-18 MED ORDER — FENTANYL CITRATE (PF) 100 MCG/2ML IJ SOLN
INTRAMUSCULAR | Status: DC | PRN
  Administered 2018-10-18 (×2): 50 ug via INTRAVENOUS

## 2018-10-18 MED ORDER — LACTATED RINGERS IV SOLN
INTRAVENOUS | Status: DC
  Administered 2018-10-18: 13:00:00 via INTRAVENOUS
  Filled 2018-10-18: qty 1000

## 2018-10-18 MED ORDER — PROPOFOL 500 MG/50ML IV EMUL
INTRAVENOUS | Status: DC | PRN
  Administered 2018-10-18: 75 ug/kg/min via INTRAVENOUS

## 2018-10-18 MED ORDER — FENTANYL CITRATE (PF) 100 MCG/2ML IJ SOLN
25.0000 ug | INTRAMUSCULAR | Status: DC | PRN
  Filled 2018-10-18: qty 1

## 2018-10-18 MED ORDER — MIDAZOLAM HCL 2 MG/2ML IJ SOLN
INTRAMUSCULAR | Status: AC
  Filled 2018-10-18: qty 2

## 2018-10-18 MED ORDER — TRAMADOL HCL 50 MG PO TABS: 50.0000 mg | ORAL_TABLET | Freq: Four times a day (QID) | ORAL | 0 refills | Status: AC | PRN

## 2018-10-18 MED ORDER — STERILE WATER FOR IRRIGATION IR SOLN
Status: DC | PRN
  Administered 2018-10-18: 3000 mL

## 2018-10-18 MED ORDER — PROPOFOL 10 MG/ML IV BOLUS
INTRAVENOUS | Status: AC
  Filled 2018-10-18: qty 20

## 2018-10-18 MED ORDER — PROPOFOL 10 MG/ML IV BOLUS
INTRAVENOUS | Status: DC | PRN
  Administered 2018-10-18 (×3): 10 mg via INTRAVENOUS
  Administered 2018-10-18: 25 mg via INTRAVENOUS

## 2018-10-18 MED ORDER — FENTANYL CITRATE (PF) 100 MCG/2ML IJ SOLN
INTRAMUSCULAR | Status: AC
  Filled 2018-10-18: qty 2

## 2018-10-18 MED ORDER — CEFAZOLIN SODIUM-DEXTROSE 2-4 GM/100ML-% IV SOLN
2.0000 g | INTRAVENOUS | Status: AC
  Administered 2018-10-18: 2 g via INTRAVENOUS
  Filled 2018-10-18: qty 100

## 2018-10-18 MED ORDER — ONDANSETRON HCL 4 MG/2ML IJ SOLN
4.0000 mg | Freq: Once | INTRAMUSCULAR | Status: DC | PRN
  Filled 2018-10-18: qty 2

## 2018-10-18 MED ORDER — LIDOCAINE HCL (CARDIAC) PF 100 MG/5ML IV SOSY
PREFILLED_SYRINGE | INTRAVENOUS | Status: DC | PRN
  Administered 2018-10-18: 50 mg via INTRAVENOUS

## 2018-10-18 SURGICAL SUPPLY — 15 items
BAG DRAIN URO-CYSTO SKYTR STRL (DRAIN) ×3 IMPLANT
BAG DRN UROCATH (DRAIN) ×1
BAG URINE DRAINAGE (UROLOGICAL SUPPLIES) ×3 IMPLANT
CATH FOLEY 2WAY SLVR  5CC 16FR (CATHETERS) ×2
CATH FOLEY 2WAY SLVR 5CC 16FR (CATHETERS) ×1 IMPLANT
CLOTH BEACON ORANGE TIMEOUT ST (SAFETY) ×3 IMPLANT
GLOVE BIO SURGEON STRL SZ8 (GLOVE) ×3 IMPLANT
GOWN STRL REUS W/TWL XL LVL3 (GOWN DISPOSABLE) ×3 IMPLANT
HOLDER FOLEY CATH W/STRAP (MISCELLANEOUS) ×3 IMPLANT
IV NS IRRIG 3000ML ARTHROMATIC (IV SOLUTION) ×6 IMPLANT
MANIFOLD NEPTUNE II (INSTRUMENTS) ×3 IMPLANT
PACK CYSTO (CUSTOM PROCEDURE TRAY) ×3 IMPLANT
SYSTEM UROLIFT (Male Continence) ×8 IMPLANT
TUBE CONNECTING 12'X1/4 (SUCTIONS) ×1
TUBE CONNECTING 12X1/4 (SUCTIONS) ×2 IMPLANT

## 2018-10-18 NOTE — Anesthesia Postprocedure Evaluation (Signed)
Anesthesia Post Note  Patient: Glenn Gonzalez  Procedure(s) Performed: CYSTOSCOPY WITH INSERTION OF UROLIFT (N/A )     Patient location during evaluation: PACU Anesthesia Type: MAC Level of consciousness: awake and alert Pain management: pain level controlled Vital Signs Assessment: post-procedure vital signs reviewed and stable Respiratory status: spontaneous breathing and respiratory function stable Cardiovascular status: stable Postop Assessment: no apparent nausea or vomiting Anesthetic complications: no    Last Vitals:  Vitals:   10/18/18 1445 10/18/18 1538  BP: 136/86 (!) 102/59  Pulse: 73 80  Resp: 17 16  Temp:  36.5 C  SpO2: 99% 100%    Last Pain:  Vitals:   10/18/18 1538  TempSrc:   PainSc: 0-No pain                 Isaak Delmundo DANIEL

## 2018-10-18 NOTE — H&P (Signed)
Urology Admission H&P  Chief Complaint: urinary frequency  History of Present Illness: Glenn Gonzalez is a 57yo with a hx of BPH with LUTS which has been refractory to medical therapy. He has failed multiple alpha blockers. He has severe urgency, frequency and a feeling of incomplete emptying. No median lobe on office cystoscopy  Past Medical History:  Diagnosis Date  . A-fib ([facility])    IN PAST  . Chronic fatigue   . Chronic kidney disease    CREATININE NOW 1.4   . Colon cancer ([place]) DX 2019   SURGERY DONE  . Complication of anesthesia    AFTER HEART TRANSPLANT TOOK 2 YEARS TO GET BACK TO SELF  . Depression    SITUATIONAL  . GERD (gastroesophageal reflux disease)   . History of chemotherapy    DAMAGED HEART MUSCLE WITH RADIATION ALSO  . History of kidney stones   . History of radiation therapy   . Hodgkin's lymphoma ([place]) 1980   IIIB. 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  . Hypothyroidism   . Pneumonia FROM CHF 2016   recurrent pneumonia sedf.  rad tx for lymphoma  . Shortness of breath dyspnea    with exertion  . Skin cancer    AREAS REMOVED FROM [facility], CHECK AND BACK AND HEAD SEPT 2019  . Status post heart transplantation ([place]) 2017   Past Surgical History:  Procedure Laterality Date  . BIOPSY  07/02/2018   Procedure: BIOPSY;  Surgeon: Carol Ada, MD;  [facility];  Service: Endoscopy;;  . CARDIAC CATHETERIZATION N/A 04/20/2015   Procedure: Right/Left Heart Cath and Coronary Angiography;  Surgeon: Jolaine Artist, MD;  Location: [place] CV LAB;  Service: Cardiovascular;  Laterality: N/A;  . CARDIAC CATHETERIZATION  10-11-15   pt states routine [facility]   . CARDIAC CATHETERIZATION N/A 04/02/2016   Procedure: Right Heart Cath;  Surgeon: Jolaine Artist, MD;  Location: [place] CV LAB;  Service: Cardiovascular;  Laterality: N/A;  . CARDIAC CATHETERIZATION  JULY 2019 AT [facility]  . COLONOSCOPY N/A 07/02/2018   Procedure:  COLONOSCOPY;  Surgeon: Carol Ada, MD;  [facility];  Service: Endoscopy;  Laterality: N/A;  . COLONOSCOPY WITH PROPOFOL N/A 08/03/2015   Procedure: COLONOSCOPY WITH PROPOFOL;  Surgeon: Carol Ada, MD;  [facility];  Service: Endoscopy;  Laterality: N/A;  wants to try to do procedure without sedation  . CYSTOSCOPY WITH RETROGRADE PYELOGRAM, URETEROSCOPY AND STENT PLACEMENT Right 10/12/2015   Procedure: CYSTOSCOPY WITH RETROGRADE PYELOGRAM, URETEROSCOPY AND STENT PLACEMENT;  Surgeon: Cleon Gustin, MD;  [facility];  Service: Urology;  Laterality: Right;  . ESOPHAGOGASTRODUODENOSCOPY    . EXPLORATORY LAPAROTOMY    . gynemastia Bilateral   . HEART TRANSPLANT  06/20/2016   at [facility]  . HOT HEMOSTASIS N/A 07/02/2018   Procedure: HOT HEMOSTASIS (ARGON PLASMA COAGULATION/BICAP);  Surgeon: Carol Ada, MD;  Location: [place];  Service: Endoscopy;  Laterality: N/A;  . LUMBAR LAMINECTOMY/DECOMPRESSION MICRODISCECTOMY Right 01/04/2016   Procedure: Microdiscectomy Lumbar four Lumbar five right;  Surgeon: Eustace Moore, MD;  Location: [place] NEURO ORS;  Service: Neurosurgery;  Laterality: Right;  . neck biopses     x5  . 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 POLYPECTOMY  07/02/2018   Procedure: POLYPECTOMY;  Surgeon: Carol Ada, MD;  [facility];  Service: Endoscopy;;  . PORTACATH PLACEMENT     06-29-15  inserted, now removed.  Marland Kitchen STONE EXTRACTION WITH BASKET Right 10/12/2015   Procedure: STONE EXTRACTION WITH BASKET;  Surgeon: Cleon Gustin, MD;  [facility];  Service: Urology;  Laterality: Right;  . SUBMUCOSAL INJECTION  07/02/2018   Procedure: SUBMUCOSAL INJECTION;  Surgeon: Carol Ada, MD;  [facility];  Service: Endoscopy;;  . TONSILLECTOMY    . VARICELE REPAIR  40 YRS AGO    Home Medications:  Current Facility-Administered Medications  Medication Dose Route Frequency Provider Last Rate  Last Dose  . ceFAZolin (ANCEF) IVPB 2g/100 mL premix  2 g Intravenous 30 min Pre-Op Cleon Gustin, MD      . lactated ringers infusion   Intravenous Continuous Ellender, Karyl Kinnier, MD 50 mL/hr at 10/18/18 1238     Allergies:  Allergies  Allergen Reactions  . Digoxin Other (See Comments)    gynecomastia   . Phencyclidine Other (See Comments)    PCP derived antibiotic > Insomnia  . Spironolactone Other (See Comments)    Gynecomastia  . Lactose Intolerance (Gi) Diarrhea and Other (See Comments)    cramps  . Milk-Related Compounds Other (See Comments)    Cramps and diarrhea Lactose intolerance   . Penicillins Other (See Comments)    Cramps Has patient had a PCN reaction causing immediate rash, facial/tongue/throat swelling, SOB or lightheadedness with hypotension: No Has patient had a PCN reaction causing severe rash involving mucus membranes or skin necrosis: No Has patient had a PCN reaction that required hospitalization No Has patient had a PCN reaction occurring within the last 10 years: No If all of the above answers are "NO", then may proceed with Cephalosporin use.    . Sulfur Rash    Family History  Problem Relation Age of Onset  . Hyperthyroidism Mother   . Insomnia Mother   . Hypertension Father   . Depression Father   . Insomnia Father    Social History:  reports that he has never smoked. He has never used smokeless tobacco. He reports that he does not drink alcohol or use drugs.  Review of Systems  Genitourinary: Positive for frequency and urgency.  All other systems reviewed and are negative.   Physical Exam:  Vital signs in last 24 hours: Temp:  [98.2 F (36.8 C)] 98.2 F (36.8 C) (11/18 1157) Pulse Rate:  [90] 90 (11/18 1157) Resp:  [16] 16 (11/18 1157) BP: (145)/(70) 145/70 (11/18 1157) SpO2:  [99 %] 99 % (11/18 1157) Weight:  [101 kg] 101 kg (11/18 1157) Physical Exam  Constitutional: He is oriented to person, place, and time. He appears  well-developed and well-nourished.  HENT:  Head: Normocephalic and atraumatic.  Eyes: Pupils are equal, round, and reactive to light. EOM are normal.  Neck: Normal range of motion. No thyromegaly present.  Cardiovascular: Normal rate and regular rhythm.  Respiratory: Effort normal. No respiratory distress.  GI: Soft. He exhibits no distension.  Musculoskeletal: Normal range of motion. He exhibits no edema.  Neurological: He is alert and oriented to person, place, and time.  Skin: Skin is warm and dry.  Psychiatric: He has a normal mood and affect. His behavior is normal. Judgment and thought content normal.    Laboratory Data:  Results for orders placed or performed during the hospital encounter of 10/18/18 (from the past 24 hour(s))  I-STAT, chem 8     Status: Abnormal   Collection Time: 10/18/18 12:37 PM  Result Value Ref Range   Sodium 138 135 - 145 mmol/L  Potassium 4.1 3.5 - 5.1 mmol/L   Chloride 101 98 - 111 mmol/L   BUN 30 (H) 6 - 20 mg/dL   Creatinine, Ser 1.30 (H) 0.61 - 1.24 mg/dL   Glucose, Bld 92 70 - 99 mg/dL   Calcium, Ion 1.21 1.15 - 1.40 mmol/L   TCO2 27 22 - 32 mmol/L   Hemoglobin 12.2 (L) 13.0 - 17.0 g/dL   HCT 36.0 (L) 39.0 - 52.0 %   No results found for this or any previous visit (from the past 240 hour(s)). Creatinine: Recent Labs    10/18/18 1237  CREATININE 1.30*   Baseline Creatinine: 1.3  Impression/Assessment:  56yo with BPh with LUTS, urinary frequency   Plan:  The risks/benefits/alternatives to Urolift was explained to the patient and he understands and wishes to proceed with surgery  Nicolette Bang 10/18/2018, 1:17 PM

## 2018-10-18 NOTE — Op Note (Signed)
   PREOPERATIVE DIAGNOSIS: Benign prostatic hypertrophy with bladder outlet obstruction with urinary frequency.  POSTOPERATIVE DIAGNOSIS: Benign prostatic hypertrophy with bladder outlet obstruction.  PROCEDURE: Cystoscopy with implantation of UroLift devices, 4 implants.  SURGEON: Nicolette Bang, M.D.  RESIDENT: Andee Poles, MD  ANESTHESIA: MAC  ANTIBIOTICS: ancef  SPECIMEN: None.  DRAINS: A 16-French Foley catheter.  BLOOD LOSS: Minimal.  COMPLICATIONS: None.  INDICATIONS:The Patient is an 57 year old white male with BPH and bladder outlet obstruction. He has failed medical therapy and has elected UroLift for definitive treatment.  FINDINGS OF PROCEDURE: He was taken to the operating room where a genral anesthetic was induced. He was placed in lithotomy position and was fitted with PAS hose. His perineum and genitalia were prepped with chlorhexidine, and he was draped in usual sterile fashion.  Cystoscopy was performed using the UroLift scope and 0 degree lens. Examination revealed a normal urethra. The external sphincter was intact. Prostatic urethra was approximately 4 cm in length with lateral lobe enlargement. There was also little bit of bladder neck elevation. Inspection of bladder revealed mild-to-moderate trabeculation with no tumors, stones, or inflammation. No cellules or diverticula were noted. Ureteral orifices were in their normal anatomic position effluxing clear urine.  After initial cystoscopy, the visual obturator was replaced with the first UroLift device. This was turned to the 9 o'clock position and pulled back to the veru and then slightly advanced. Pressure was then applied to the right lateral lobe and the UroLift device was deployed.  The second UroLift device was then inserted and applied to the left lateral lobe at 3 o'clock and deployed in the mid prostatic urethra. After this, there was still some apparent  obstruction closer to the bladder neck. So a second level of UroLift your left device was applied between the mid urethra and the proximal urethra providing further patency to the prostatic urethra. The scope was removed and a 16-French Foley catheter was inserted without difficulty. The balloon was filled with 10 mL sterile fluid, and the catheter was placed to straight drainage.  COMPLICATIONS: None   CONDITION: Stable, extubated, transferred to PACU  PLAN: The patient will be discharged home and followup in 2 days for a voiding trial.

## 2018-10-18 NOTE — Anesthesia Preprocedure Evaluation (Addendum)
Anesthesia Evaluation  Patient identified by MRN, date of birth, ID band Patient awake    Reviewed: Allergy & Precautions, NPO status , Patient's Chart, lab work & pertinent test results  Airway Mallampati: II  TM Distance: >3 FB Neck ROM: Full    Dental no notable dental hx.    Pulmonary shortness of breath and with exertion,    Pulmonary exam normal breath sounds clear to auscultation       Cardiovascular Normal cardiovascular exam Rhythm:Regular Rate:Normal  ECG: ST, rate 103  CP exercise test Conclusion: Exercise testing with gas exchange demonstrates a moderate functional impairment in a healthy population norm. However, PVO2 is more so in normal range for post-heart transplant. There was a significant degree of chronotropic incompetence and hypotensive response to incremental exercise,likely related to denervation, particularly in the setting of exertion. Also note, VE/VCO2 slope is also mildly elevated beyond a widened norm range for heart transplant. Patient's body habitus is playing a role in his exercise intolerance. Of note, pre-exercise spirometry demonstrates mild to moderate restrictive properties.   Hx/o non ischemic CM S/P Heart transplant Hx/o Atrial fibrillation and V. Tach- none since transplant Hx/o AICD placement  [facility] 06-23-18 [facility] 06-23-18 [facility] 02-22-18 chart/epic Echo 05-12-17 chart/epic lov 06-23-18 [facility] chart   Neuro/Psych PSYCHIATRIC DISORDERS Depression negative neurological ROS     GI/Hepatic Neg liver ROS, GERD  Medicated and Controlled,  Endo/Other  Hypothyroidism   Renal/GU negative Renal ROS     Musculoskeletal Gout   Abdominal   Peds  Hematology negative hematology ROS (+)   Anesthesia Other Findings BENIGN PROSTATIC HYPERPLASIA  Reproductive/Obstetrics                             Anesthesia Physical Anesthesia Plan  ASA: III  Anesthesia Plan: MAC   Post-op Pain Management:    Induction: Intravenous  PONV Risk Score and Plan: 1 and Propofol infusion and Treatment may vary due to age or medical condition  Airway Management Planned: Natural Airway  Additional Equipment:   Intra-op Plan:   Post-operative Plan:   Informed Consent: I have reviewed the patients History and Physical, chart, labs and discussed the procedure including the risks, benefits and alternatives for the proposed anesthesia with the patient or authorized representative who has indicated his/her understanding and acceptance.   Dental advisory given  Plan Discussed with: CRNA  Anesthesia Plan Comments:         Anesthesia Quick Evaluation

## 2018-10-18 NOTE — Discharge Instructions (Signed)
1. You may see some blood in the urine and may have some burning with urination for 48-72 hours. You also may notice that you have to urinate more frequently or urgently after your procedure which is normal.  2. You should call should you develop an inability urinate, fever > 101, persistent nausea and vomiting that prevents you from eating or drinking to stay hydrated.  3. If you have a stent, you will likely urinate more frequently and urgently until the stent is removed and you may experience some discomfort/pain in the lower abdomen and flank especially when urinating. You may take pain medication prescribed to you if needed for pain. You may also intermittently have blood in the urine until the stent is removed. 4. If you have a catheter, you will be taught how to take care of the catheter by the nursing staff prior to discharge from the hospital.  You may periodically feel a strong urge to void with the catheter in place.  This is a bladder spasm and most often can occur when having a bowel movement or moving around. It is typically self-limited and usually will stop after a few minutes.  You may use some Vaseline or Neosporin around the tip of the catheter to reduce friction at the tip of the penis. You may also see some blood in the urine.  A very small amount of blood can make the urine look quite red.  As long as the catheter is draining well, there usually is not a problem.  However, if the catheter is not draining well and is bloody, you should call the office [phone]) to notify us.   Post Anesthesia Home Care Instructions  Activity: Get plenty of rest for the remainder of the day. A responsible individual must stay with you for 24 hours following the procedure.  For the next 24 hours, DO NOT: -Drive a car -Paediatric nurse -Drink alcoholic beverages -Take any medication unless instructed by your physician -Make any legal decisions or sign important papers.  Meals: Start with  liquid foods such as gelatin or soup. Progress to regular foods as tolerated. Avoid greasy, spicy, heavy foods. If nausea and/or vomiting occur, drink only clear liquids until the nausea and/or vomiting subsides. Call your physician if vomiting continues.  Special Instructions/Symptoms: Your throat may feel dry or sore from the anesthesia or the breathing tube placed in your throat during surgery. If this causes discomfort, gargle with warm salt water. The discomfort should disappear within 24 hours.

## 2018-10-18 NOTE — Transfer of Care (Signed)
Immediate Anesthesia Transfer of Care Note  Patient: Glenn Gonzalez  Procedure(s) Performed: CYSTOSCOPY WITH INSERTION OF UROLIFT (N/A )  Patient Location: PACU  Anesthesia Type:MAC  Level of Consciousness: awake  Airway & Oxygen Therapy: Patient Spontanous Breathing and Patient connected to face mask oxygen  Post-op Assessment: Report given to RN and Post -op Vital signs reviewed and stable  Post vital signs: Reviewed and stable  Last Vitals:  Vitals Value Taken Time  BP    Temp    Pulse 78 10/18/2018  1:57 PM  Resp 8 10/18/2018  1:57 PM  SpO2 100 % 10/18/2018  1:57 PM  Vitals shown include unvalidated device data.  Last Pain:  Vitals:   10/18/18 1157  TempSrc: Oral         Complications: No apparent anesthesia complications

## 2018-10-18 NOTE — Anesthesia Procedure Notes (Signed)
Procedure Name: MAC Date/Time: 10/18/2018 1:30 PM Performed by: Lieutenant Diego, CRNA Pre-anesthesia Checklist: Patient identified, Emergency Drugs available, Suction available and Patient being monitored Patient Re-evaluated:Patient Re-evaluated prior to induction Preoxygenation: Pre-oxygenation with 100% oxygen Induction Type: IV induction

## 2018-10-19 ENCOUNTER — Encounter (HOSPITAL_BASED_OUTPATIENT_CLINIC_OR_DEPARTMENT_OTHER): Payer: Self-pay | Admitting: Urology

## 2018-10-20 DIAGNOSIS — R3912 Poor urinary stream: Secondary | ICD-10-CM | POA: Diagnosis not present

## 2018-10-20 DIAGNOSIS — N401 Enlarged prostate with lower urinary tract symptoms: Secondary | ICD-10-CM | POA: Diagnosis not present

## 2018-11-01 DIAGNOSIS — R194 Change in bowel habit: Secondary | ICD-10-CM | POA: Diagnosis not present

## 2018-11-01 DIAGNOSIS — C20 Malignant neoplasm of rectum: Secondary | ICD-10-CM | POA: Diagnosis not present

## 2018-11-01 DIAGNOSIS — K219 Gastro-esophageal reflux disease without esophagitis: Secondary | ICD-10-CM | POA: Diagnosis not present

## 2018-11-02 ENCOUNTER — Ambulatory Visit (INDEPENDENT_AMBULATORY_CARE_PROVIDER_SITE_OTHER): Payer: 59 | Admitting: Podiatry

## 2018-11-02 ENCOUNTER — Other Ambulatory Visit: Payer: Self-pay | Admitting: Podiatry

## 2018-11-02 ENCOUNTER — Encounter: Payer: Self-pay | Admitting: Podiatry

## 2018-11-02 ENCOUNTER — Ambulatory Visit (INDEPENDENT_AMBULATORY_CARE_PROVIDER_SITE_OTHER): Payer: 59

## 2018-11-02 VITALS — BP 131/83 | HR 79

## 2018-11-02 DIAGNOSIS — M7661 Achilles tendinitis, right leg: Secondary | ICD-10-CM

## 2018-11-02 DIAGNOSIS — B351 Tinea unguium: Secondary | ICD-10-CM | POA: Diagnosis not present

## 2018-11-02 DIAGNOSIS — M779 Enthesopathy, unspecified: Secondary | ICD-10-CM | POA: Diagnosis not present

## 2018-11-02 DIAGNOSIS — M79671 Pain in right foot: Secondary | ICD-10-CM

## 2018-11-03 DIAGNOSIS — N401 Enlarged prostate with lower urinary tract symptoms: Secondary | ICD-10-CM | POA: Diagnosis not present

## 2018-11-03 DIAGNOSIS — R3912 Poor urinary stream: Secondary | ICD-10-CM | POA: Diagnosis not present

## 2018-11-04 DIAGNOSIS — Z85048 Personal history of other malignant neoplasm of rectum, rectosigmoid junction, and anus: Secondary | ICD-10-CM | POA: Diagnosis not present

## 2018-11-04 DIAGNOSIS — D128 Benign neoplasm of rectum: Secondary | ICD-10-CM | POA: Diagnosis not present

## 2018-11-04 DIAGNOSIS — K621 Rectal polyp: Secondary | ICD-10-CM | POA: Diagnosis not present

## 2018-11-04 DIAGNOSIS — Z85038 Personal history of other malignant neoplasm of large intestine: Secondary | ICD-10-CM | POA: Diagnosis not present

## 2018-11-08 DIAGNOSIS — B351 Tinea unguium: Secondary | ICD-10-CM | POA: Insufficient documentation

## 2018-11-08 NOTE — Progress Notes (Signed)
Subjective:   Patient ID: Glenn Gonzalez, male   DOB: 57 y.o.   MRN: [id number]   HPI 57 year old male presents the office today for concerns of toenail fungus mostly to his big toes which is been ongoing for last 6 to 7 years.  He states he was seen previously he was told that he did not have a fungus although he still thinks that there is fungus on the nails and he desires treatment for them.  They are not causing any pain and denies any redness or drainage or any swelling.  Also is starting to get some discomfort to the right foot. He did brake his foot about 6 years ago and still hurts occasionally he will have the area checked when he is here.  He denies any recent injury or trauma he denies any current pain.   Review of Systems  All other systems reviewed and are negative.  Past Medical History:  Diagnosis Date  . A-fib (HCC)    IN PAST  . Chronic fatigue   . Chronic kidney disease    CREATININE NOW 1.4   . Colon cancer ([place]) DX 2019   SURGERY DONE  . Complication of anesthesia    AFTER HEART TRANSPLANT TOOK 2 YEARS TO GET BACK TO SELF  . Depression    SITUATIONAL  . GERD (gastroesophageal reflux disease)   . History of chemotherapy    DAMAGED HEART MUSCLE WITH RADIATION ALSO  . History of kidney stones   . History of radiation therapy   . Hodgkin's lymphoma ([place]) 1980   IIIB. 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  . Hypothyroidism   . Pneumonia FROM CHF 2016   recurrent pneumonia sedf.  rad tx for lymphoma  . Shortness of breath dyspnea    with exertion  . Skin cancer    AREAS REMOVED FROM [facility], CHECK AND BACK AND HEAD SEPT 2019  . Status post heart transplantation ([place]) 2017    Past Surgical History:  Procedure Laterality Date  . BIOPSY  07/02/2018   Procedure: BIOPSY;  Surgeon: Carol Ada, MD;  Location: [facility];  Service: Endoscopy;;  . CARDIAC CATHETERIZATION N/A 04/20/2015   Procedure: Right/Left Heart Cath and Coronary Angiography;   Surgeon: Jolaine Artist, MD;  Location: [place] [facility];  Service: Cardiovascular;  Laterality: N/A;  . CARDIAC CATHETERIZATION  10-11-15   pt states routine heart cath to be done at [facility]   . CARDIAC CATHETERIZATION N/A 04/02/2016   Procedure: Right Heart Cath;  Surgeon: Jolaine Artist, MD;  Location: [place] [facility];  Service: Cardiovascular;  Laterality: N/A;  . CARDIAC CATHETERIZATION  JULY 2019 AT [facility]  . COLONOSCOPY N/A 07/02/2018   Procedure: COLONOSCOPY;  Surgeon: Carol Ada, MD;  [facility];  Service: Endoscopy;  Laterality: N/A;  . COLONOSCOPY WITH PROPOFOL N/A 08/03/2015   Procedure: COLONOSCOPY WITH PROPOFOL;  Surgeon: Carol Ada, MD;  [facility];  Service: Endoscopy;  Laterality: N/A;  wants to try to do procedure without sedation  . CYSTOSCOPY WITH INSERTION OF UROLIFT N/A 10/18/2018   Procedure: CYSTOSCOPY WITH INSERTION OF UROLIFT;  Surgeon: Cleon Gustin, MD;  Location: [facility];  Service: Urology;  Laterality: N/A;  30 MINS  . CYSTOSCOPY WITH RETROGRADE PYELOGRAM, URETEROSCOPY AND STENT PLACEMENT Right 10/12/2015   Procedure: CYSTOSCOPY WITH RETROGRADE PYELOGRAM, URETEROSCOPY AND STENT PLACEMENT;  Surgeon: Cleon Gustin, MD;  [facility];  Service: Urology;  Laterality: Right;  .  ESOPHAGOGASTRODUODENOSCOPY    . EXPLORATORY LAPAROTOMY    . gynemastia Bilateral   . HEART TRANSPLANT  06/20/2016   at [facility]  . HOT HEMOSTASIS N/A 07/02/2018   Procedure: HOT HEMOSTASIS (ARGON PLASMA COAGULATION/BICAP);  Surgeon: Carol Ada, MD;  Location: [facility];  Service: Endoscopy;  Laterality: N/A;  . LUMBAR LAMINECTOMY/DECOMPRESSION MICRODISCECTOMY Right 01/04/2016   Procedure: Microdiscectomy Lumbar four Lumbar five right;  Surgeon: Eustace Moore, MD;  Location: [facility];  Service: Neurosurgery;  Laterality: Right;  . neck biopses     x5  . permanent pacemaker     [employer] COGNIS device REMOVED WITH OLD  HEART  . POLYPECTOMY  07/02/2018   Procedure: POLYPECTOMY;  Surgeon: Carol Ada, MD;  [facility];  Service: Endoscopy;;  . PORTACATH PLACEMENT     06-29-15 inserted, now removed.  Marland Kitchen STONE EXTRACTION WITH BASKET Right 10/12/2015   Procedure: STONE EXTRACTION WITH BASKET;  Surgeon: Cleon Gustin, MD;  [facility];  Service: Urology;  Laterality: Right;  . SUBMUCOSAL INJECTION  07/02/2018   Procedure: SUBMUCOSAL INJECTION;  Surgeon: Carol Ada, MD;  [facility];  Service: Endoscopy;;  . TONSILLECTOMY    . VARICELE REPAIR  40 YRS AGO     Current Outpatient Medications:  .  acetaminophen (TYLENOL) 650 MG CR tablet, Take 1,300 mg by mouth at bedtime. , Disp: , Rfl:  .  allopurinol (ZYLOPRIM) 100 MG tablet, Take 200 mg by mouth See admin instructions. Take 200 mg by mouth in the morning at 0900, Disp: , Rfl: 10 .  aspirin EC 81 MG tablet, Take 81 mg by mouth See admin instructions. Take 81 mg by mouth in the morning at 0900, Disp: , Rfl:  .  Calcium Carbonate-Vitamin D3 (RA CALCIUM 600/VITAMIN D-3) 600-400 MG-UNIT TABS, Take 1 tablet by mouth See admin instructions. Take 1 tablet by mouth in the afternoon at 1300 and take 1 tablet by mouth in the evening at 1700, Disp: , Rfl:  .  cetirizine (ZYRTEC) 10 MG tablet, Take 10 mg by mouth See admin instructions. Take 10 mg by mouth at bedtime at 2100, Disp: , Rfl:  .  ENVARSUS XR 1 MG TB24, Take 2 mg by mouth See admin instructions. Take 2 mg by mouth in the morning at 0900, Disp: , Rfl:  .  FLUoxetine (PROZAC) 10 MG capsule, Take 10 mg by mouth See admin instructions. Take 10 mg by mouth in the morning at 0900, Disp: , Rfl:  .  fluticasone (FLONASE) 50 MCG/ACT nasal spray, Place 1 spray into both nostrils daily as needed for allergies or rhinitis. , Disp: , Rfl:  .  furosemide (LASIX) 20 MG tablet, Take 20 mg by mouth daily as needed for fluid. As needed based upon weight and fluid status, Disp: , Rfl:  .  levothyroxine  (SYNTHROID, LEVOTHROID) 125 MCG tablet, Take 125 mcg by mouth See admin instructions. Take 125 mcg by mouth in the morning at 0900, Disp: , Rfl:  .  magnesium oxide (MAG-OX) 400 MG tablet, Take 400 mg by mouth See admin instructions. Take 400 mg by mouth in the afternoon at 1300 and take 400 mg by mouth in the evening at 1700, Disp: , Rfl:  .  mycophenolate (CELLCEPT) 500 MG tablet, Take 1,000 mg by mouth See admin instructions. Take 1000 mg by mouth in the morning at 0900 and take 1000 mg by mouth at bedtime at 2100, Disp: , Rfl:  .  [place] [place]  [facility] Anti-fungal (Nail)  (3) Refills, Disp: , Rfl:  .  predniSONE (DELTASONE) 2.5 MG tablet, Take 1 tablet (2.5 mg total) by mouth daily. (Patient taking differently: Take 2.5 mg by mouth See admin instructions. Take 2.5 mg by mouth in the morning at 0900), Disp: 20 tablet, Rfl: 0 .  RABEprazole (ACIPHEX) 20 MG tablet, Take 1 tablet (20 mg total) by mouth 2 (two) times daily. (Patient taking differently: Take 20 mg by mouth See admin instructions. Take 20 mg by mouth in the morning at 0900 and take 20 mg by mouth at bedtime at 2100), Disp: 180 tablet, Rfl: 3 .  valACYclovir (VALTREX) 500 MG tablet, Take 500 mg by mouth 2 (two) times daily as needed (for outbreak). , Disp: , Rfl:   Allergies  Allergen Reactions  . Digoxin Other (See Comments)    gynecomastia   . Phencyclidine Other (See Comments)    PCP derived antibiotic > Insomnia  . Spironolactone Other (See Comments)    Gynecomastia  . Lactose Intolerance (Gi) Diarrhea and Other (See Comments)    cramps  . Milk-Related Compounds Other (See Comments)    Cramps and diarrhea Lactose intolerance   . Penicillins Other (See Comments)    Cramps Has patient had a PCN reaction causing immediate rash, facial/tongue/throat swelling, SOB or lightheadedness with hypotension: No Has patient had a PCN reaction causing severe rash involving mucus membranes or skin necrosis: No Has patient had  a PCN reaction that required hospitalization No Has patient had a PCN reaction occurring within the last 10 years: No If all of the above answers are "NO", then may proceed with Cephalosporin use.    . Sulfur Rash          Objective:  Physical Exam  General: AAO x3, NAD  Dermatological: Nails are hypertrophic, dystrophic, discolored with yellow to brown discoloration but the hallux toenails no significant.  There is no pain in the nails there is no surrounding redness or drainage or any signs of infection.  No open lesions are identified today.  Vascular: Dorsalis Pedis artery and Posterior Tibial artery pedal pulses are 2/4 bilateral with immedate capillary fill time. There is no pain with calf compression, swelling, warmth, erythema.   Neruologic: Grossly intact via light touch bilateral.  Protective threshold with Semmes Wienstein monofilament intact to all pedal sites bilateral.   Musculoskeletal: There is no significant area pinpoint tenderness identified to bilateral lower extremities there is no significant edema, erythema, increase in warmth.  Achilles tendon, flexor, extensor tendons appear to be intact.  Muscular strength 5/5 in all groups tested bilateral. Bunion deformity is present.   Gait: Unassisted, Nonantalgic.       Assessment:   57 year old male with onychodystrophy, likely onychomycosis; intermittent right foot pain    Plan:  -Treatment options discussed including all alternatives, risks, and complications -Etiology of symptoms were discussed -X-rays were obtained and reviewed with the patient.  There is no evidence of acute fracture or stress fracture identified on the right foot.  There is bunion deformity present with mild arthritic changes present. -Currently there is no pain of the right foot or heel.  We discussed wearing supportive shoes and also inserts to help support his foot.  Discussed over-the-counter insert. -In regard to the toenails which is  his main concern today, we discussed options.  He given his medical history but avoid any oral medications.  I ordered a topical antifungal for him but we discussed success rates with his type of  toenail and topical treatment.  Also he is going to contact his transplant physicians to make sure he is able to take the medication.  He verbalized understanding he states he does not take any medication not asking them.  Trula Slade DPM

## 2018-12-07 ENCOUNTER — Ambulatory Visit: Payer: Medicare Other | Admitting: Podiatry

## 2018-12-15 DIAGNOSIS — Z5181 Encounter for therapeutic drug level monitoring: Secondary | ICD-10-CM | POA: Diagnosis not present

## 2018-12-15 DIAGNOSIS — Z48298 Encounter for aftercare following other organ transplant: Secondary | ICD-10-CM | POA: Diagnosis not present

## 2018-12-15 DIAGNOSIS — D899 Disorder involving the immune mechanism, unspecified: Secondary | ICD-10-CM | POA: Diagnosis not present

## 2018-12-15 DIAGNOSIS — N183 Chronic kidney disease, stage 3 (moderate): Secondary | ICD-10-CM | POA: Diagnosis not present

## 2018-12-15 DIAGNOSIS — Z941 Heart transplant status: Secondary | ICD-10-CM | POA: Diagnosis not present

## 2018-12-16 DIAGNOSIS — R3912 Poor urinary stream: Secondary | ICD-10-CM | POA: Diagnosis not present

## 2018-12-16 DIAGNOSIS — N401 Enlarged prostate with lower urinary tract symptoms: Secondary | ICD-10-CM | POA: Diagnosis not present

## 2018-12-17 DIAGNOSIS — Z48298 Encounter for aftercare following other organ transplant: Secondary | ICD-10-CM | POA: Diagnosis not present

## 2018-12-17 DIAGNOSIS — Z941 Heart transplant status: Secondary | ICD-10-CM | POA: Diagnosis not present

## 2018-12-27 DIAGNOSIS — D485 Neoplasm of uncertain behavior of skin: Secondary | ICD-10-CM | POA: Diagnosis not present

## 2018-12-27 DIAGNOSIS — D225 Melanocytic nevi of trunk: Secondary | ICD-10-CM | POA: Diagnosis not present

## 2018-12-27 DIAGNOSIS — Z85828 Personal history of other malignant neoplasm of skin: Secondary | ICD-10-CM | POA: Diagnosis not present

## 2018-12-27 DIAGNOSIS — L821 Other seborrheic keratosis: Secondary | ICD-10-CM | POA: Diagnosis not present

## 2018-12-27 DIAGNOSIS — L570 Actinic keratosis: Secondary | ICD-10-CM | POA: Diagnosis not present

## 2019-01-13 DIAGNOSIS — Z941 Heart transplant status: Secondary | ICD-10-CM | POA: Diagnosis not present

## 2019-01-20 DIAGNOSIS — Z0000 Encounter for general adult medical examination without abnormal findings: Secondary | ICD-10-CM | POA: Diagnosis not present

## 2019-01-20 DIAGNOSIS — D492 Neoplasm of unspecified behavior of bone, soft tissue, and skin: Secondary | ICD-10-CM | POA: Diagnosis not present

## 2019-01-20 DIAGNOSIS — M5126 Other intervertebral disc displacement, lumbar region: Secondary | ICD-10-CM | POA: Diagnosis not present

## 2019-01-26 DIAGNOSIS — Z23 Encounter for immunization: Secondary | ICD-10-CM | POA: Diagnosis not present

## 2019-01-31 ENCOUNTER — Ambulatory Visit (INDEPENDENT_AMBULATORY_CARE_PROVIDER_SITE_OTHER): Payer: 59 | Admitting: Podiatry

## 2019-01-31 DIAGNOSIS — B351 Tinea unguium: Secondary | ICD-10-CM

## 2019-01-31 DIAGNOSIS — L603 Nail dystrophy: Secondary | ICD-10-CM | POA: Diagnosis not present

## 2019-02-01 DIAGNOSIS — Z941 Heart transplant status: Secondary | ICD-10-CM | POA: Diagnosis not present

## 2019-02-01 DIAGNOSIS — D492 Neoplasm of unspecified behavior of bone, soft tissue, and skin: Secondary | ICD-10-CM | POA: Diagnosis not present

## 2019-02-01 NOTE — Progress Notes (Signed)
Subjective: 58 year old male presents the office today for follow-up evaluation of toenail dystrophy as well as damage to bilateral hallux toenails.  Has been using a compound cream for the last couple months he has not seen much improvement.  Denies any pain in the nails and denies any redness or drainage pain swelling. Denies any systemic complaints such as fevers, chills, nausea, vomiting. No acute changes since last appointment, and no other complaints at this time.   Objective: AAO x3, NAD DP/PT pulses palpable bilaterally, CRT less than 3 seconds Bilateral hallux tones are hypertrophic, dystrophic with yellow-brown discoloration.  There is clinically appear to be some fungus but is more of a damaged toenail.  There is no pain of the nails at this time there is no redness or drainage or any signs of infection. No open lesions or pre-ulcerative lesions.  No pain with calf compression, swelling, warmth, erythema  Assessment: Onychodystrophy, onychomycosis bilateral hallux  Plan: -All treatment options discussed with the patient including all alternatives, risks, complications.  -Debrided bilateral hallux nails without any complications or bleeding. -At this time were to continue with topical antifungal.  Also would order urea 40% to add in conjunction with the current regimen.  Discussed success rates that this. -Patient encouraged to call the office with any questions, concerns, change in symptoms.   Trula Slade DPM

## 2019-03-28 IMAGING — CT CT ABD-PELV W/O CM
2 of 4 series · 16 of 46 positions shown, 18 images · non-contrast
Comparison: 07/31/2015

CLINICAL DATA: Evaluate pyelonephritis.  Hematuria and fevers.

EXAM:
CT ABDOMEN AND PELVIS WITHOUT CONTRAST
TECHNIQUE: Multidetector CT imaging of the abdomen and pelvis was performed
following the standard protocol without IV contrast.

[Series 3: abd/ pelvis 5.0 i30f 2 · axial · 0.97mm/px · z∈[+812,+1322]mm · 13 of 112 slices shown, 15 images]
[im 5/112  soft-tissue]
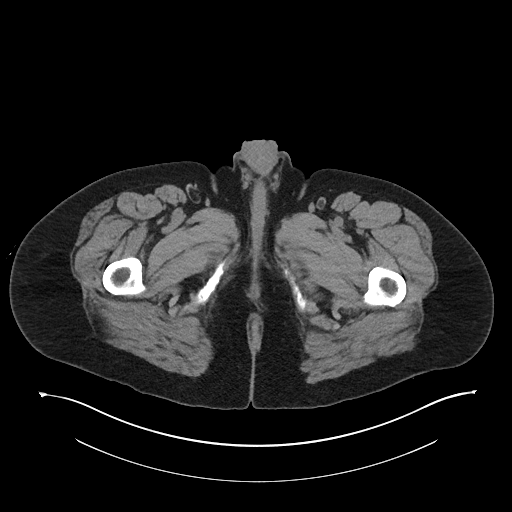
[im 5/112  bone]
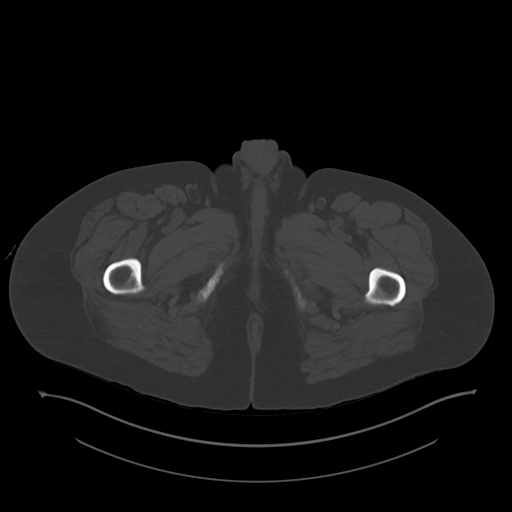
[im 13/112  soft-tissue]
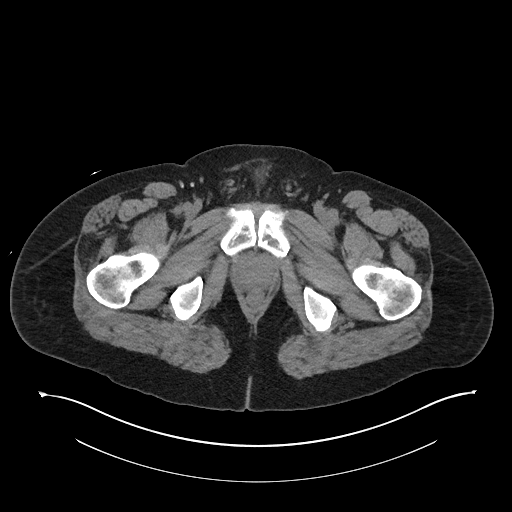
[im 22/112  soft-tissue]
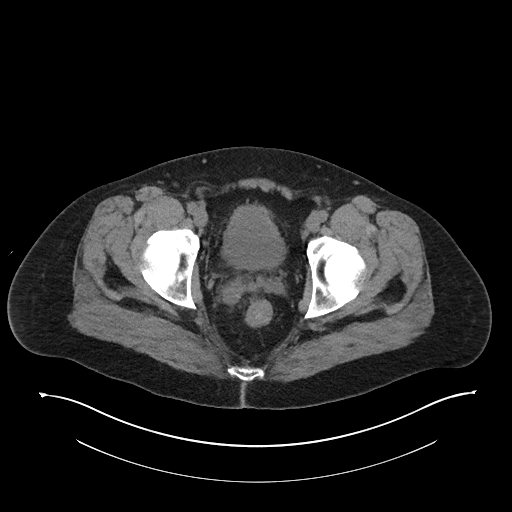
[im 30/112  soft-tissue]
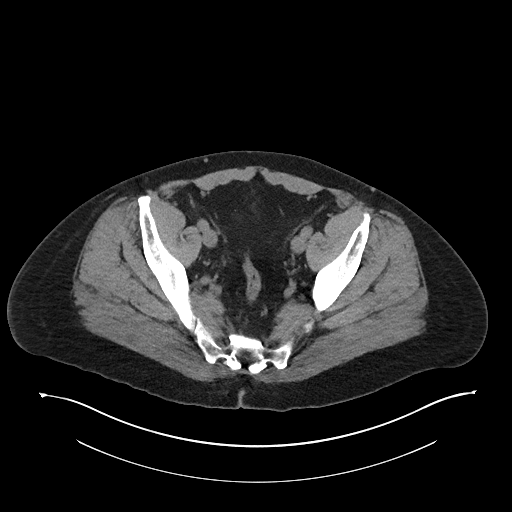
[im 39/112  soft-tissue]
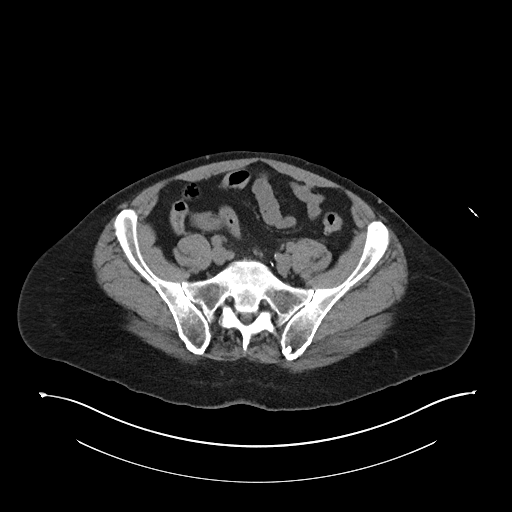
[im 47/112  soft-tissue]
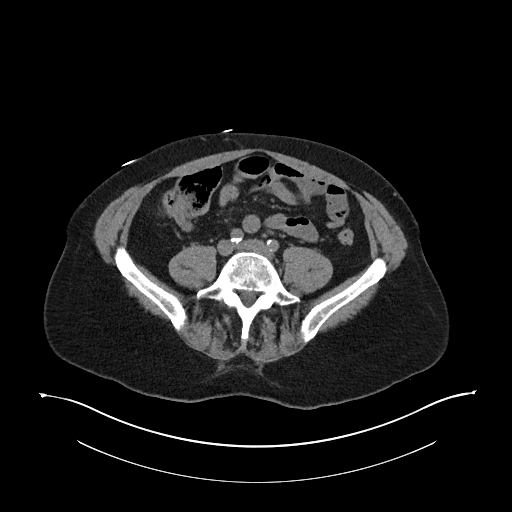
[im 56/112  soft-tissue]
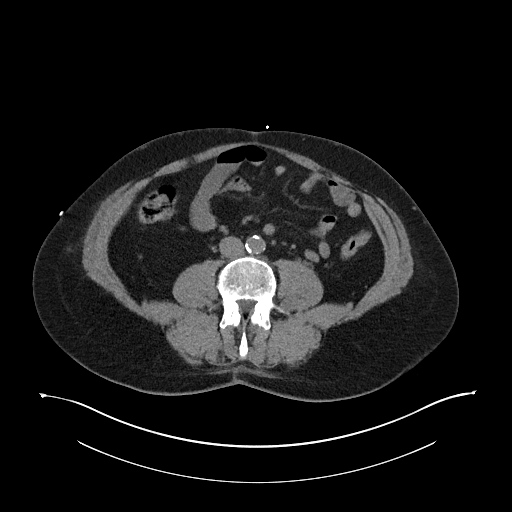
[im 65/112  soft-tissue]
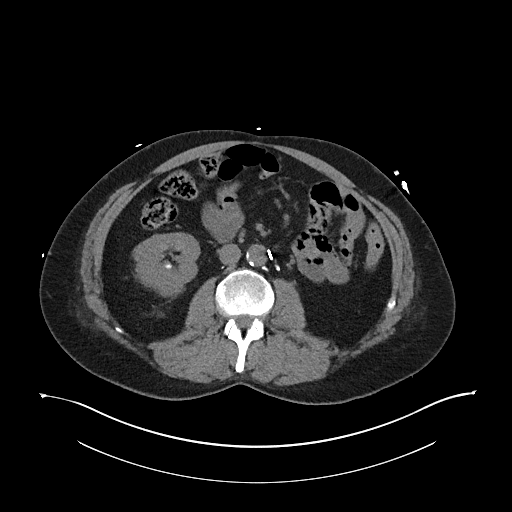
[im 73/112  soft-tissue]
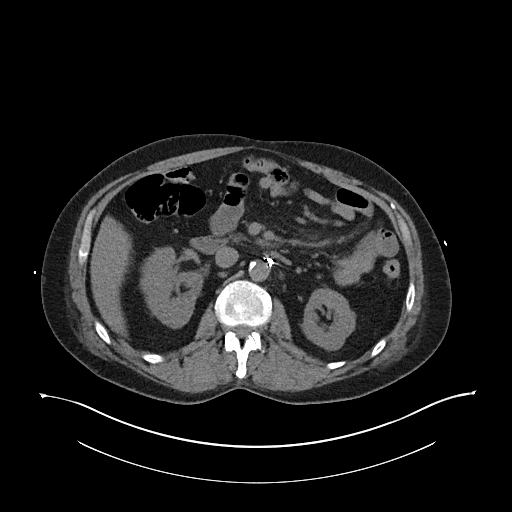
[im 73/112  bone]
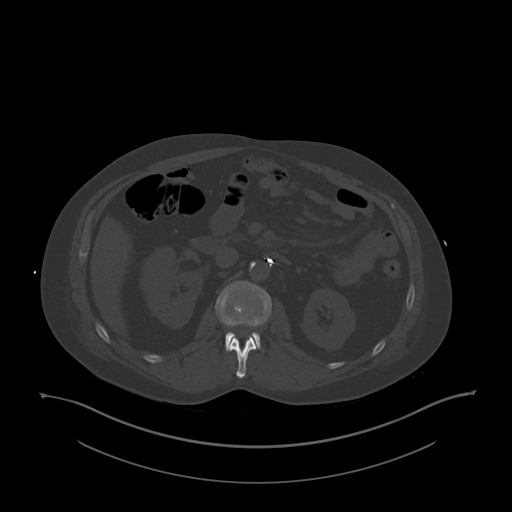
[im 82/112  soft-tissue]
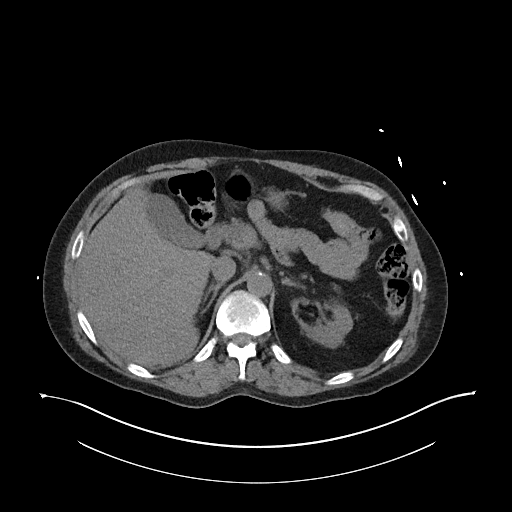
[im 90/112  soft-tissue]
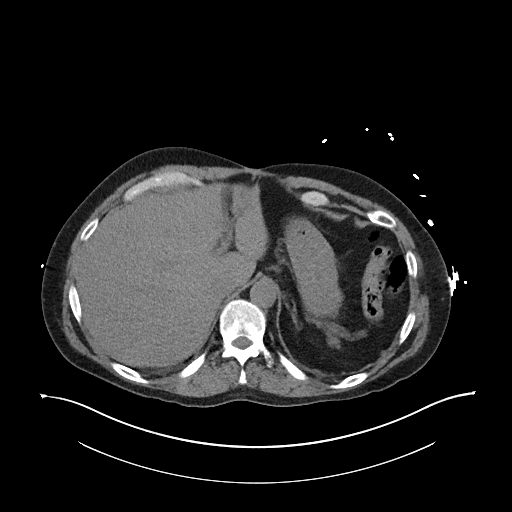
[im 99/112  soft-tissue]
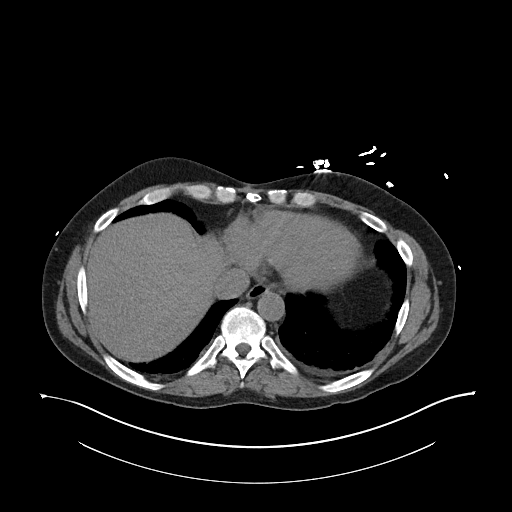
[im 107/112  soft-tissue]
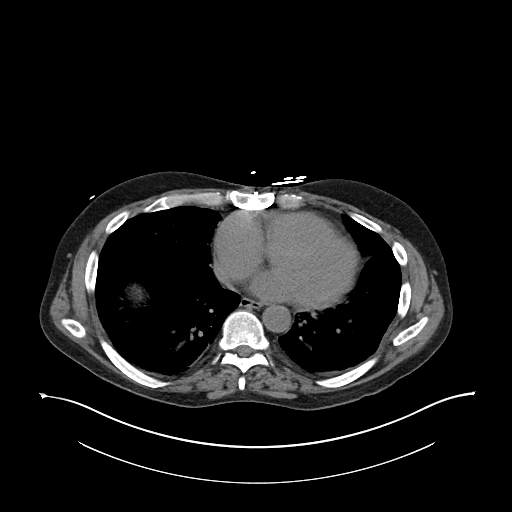

[Series 6: cor st · coronal · 0.90mm/px · 3 of 103 slices shown]
[im 35/103  soft-tissue]
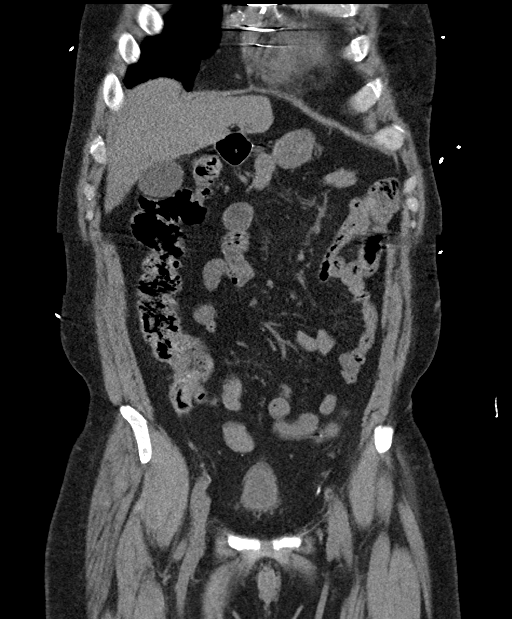
[im 46/103  soft-tissue]
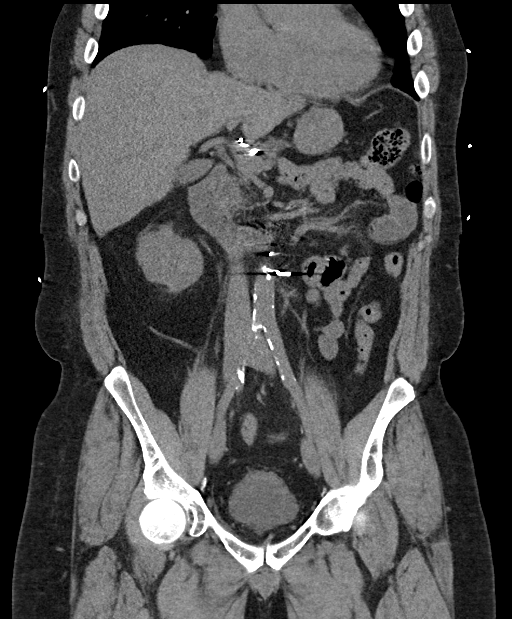
[im 57/103  soft-tissue]
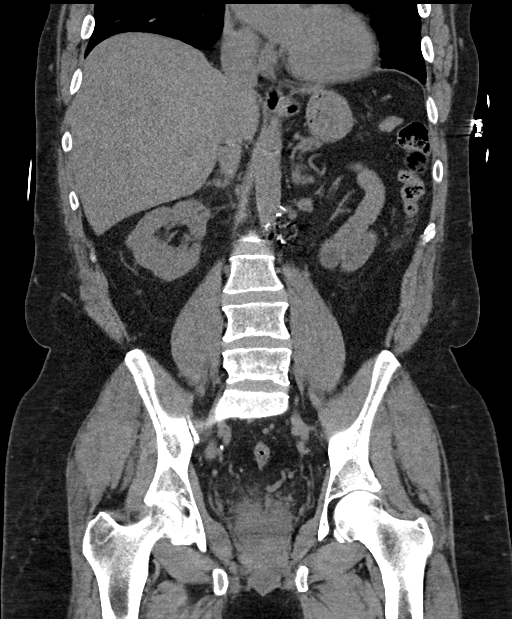

[16 of 46 positions shown; findings below may reference images not displayed]

FINDINGS: Lower chest: Pleural thickening is identified overlying the
posterior left lower lobe noted.

Hepatobiliary: No focal liver abnormality is seen. No gallstones,
gallbladder wall thickening, or biliary dilatation.

Pancreas: Unremarkable. No pancreatic ductal dilatation or
surrounding inflammatory changes.

Spleen: Previous splenectomy.

Adrenals/Urinary Tract: The adrenal glands appear normal. Stone
within the inferior pole of the right kidney measures 5 mm, image
47/3. Atrophy is noted involving the upper pole of the left kidney.
Nonobstructing calculus is noted within the inferior pole of the
left kidney, image 71/6. No hydronephrosis or perinephric fat
stranding. Urinary bladder is unremarkable.

Stomach/Bowel: Stomach is normal. The small bowel loops have a
normal course and caliber without obstruction. Unremarkable
appearance of the colon.

Vascular/Lymphatic: Aortic atherosclerosis. No aneurysm. Surgical
clips identified within the left retroperitoneum and adjacent to the
head of pancreas. No adenopathy identified within the abdomen or
pelvis. No inguinal adenopathy.

Reproductive: Prostate gland measures 3.8 by 5.1 by 4.3 cm (volume =
44 cm^3).

Other: No free fluid or fluid collections identified.

Musculoskeletal: No suspicious bone lesions.
IMPRESSION: 1. No acute findings identified within the abdomen or pelvis.
2. Bilateral nonobstructing renal calculi. No evidence for
hydronephrosis.
3.  Aortic Atherosclerosis ([id number]).
4. Mild prostate gland enlargement.

## 2019-04-14 ENCOUNTER — Telehealth: Payer: Self-pay | Admitting: Cardiology

## 2019-04-14 NOTE — Telephone Encounter (Signed)
Spoke w/ pt and he confirmed that he had a heart transplant and no longer has a PPM.

## 2019-04-18 ENCOUNTER — Other Ambulatory Visit: Payer: Self-pay | Admitting: Gastroenterology

## 2019-05-02 ENCOUNTER — Ambulatory Visit: Payer: Medicare Other | Admitting: Podiatry

## 2019-05-13 ENCOUNTER — Other Ambulatory Visit: Payer: Self-pay | Admitting: Gastroenterology

## 2019-05-17 ENCOUNTER — Other Ambulatory Visit (HOSPITAL_COMMUNITY)
Admission: RE | Admit: 2019-05-17 | Discharge: 2019-05-17 | Disposition: A | Discharge status: Home / Self Care | Payer: 59 | Source: Ambulatory Visit | Attending: Gastroenterology | Admitting: Gastroenterology

## 2019-05-17 DIAGNOSIS — Z1159 Encounter for screening for other viral diseases: Secondary | ICD-10-CM | POA: Diagnosis not present

## 2019-05-18 LAB — NOVEL CORONAVIRUS, NAA (HOSP ORDER, SEND-OUT TO REF LAB; TAT 18-24 HRS): SARS-CoV-2, NAA: NOT DETECTED

## 2019-05-19 ENCOUNTER — Other Ambulatory Visit: Payer: Self-pay

## 2019-05-19 ENCOUNTER — Encounter (HOSPITAL_COMMUNITY): Payer: Self-pay | Admitting: Emergency Medicine

## 2019-05-19 NOTE — Progress Notes (Signed)
Pre-op call progress note:   Patient hx of heart transplant done at [facility] 2017. Managed by [facility] transplant team, led by Dr Posey Pronto but directly managed by Dr Mosetta Pigeon. LOV was 12-15-2018 , patient reports since that visit no chest pain , sob, fainting spells, nor palpitations. ECHO done in epic care everywhere.  Hx of CKD  III, managed by cardio team at [place], stable per lov note.   Patient states D Benson [place] Is aware no anesthesia to be used for this procedure and has been permitted to drive himself home after procedure .

## 2019-05-20 ENCOUNTER — Other Ambulatory Visit: Payer: Self-pay

## 2019-05-20 ENCOUNTER — Ambulatory Visit (HOSPITAL_COMMUNITY)
Admission: RE | Admit: 2019-05-20 | Discharge: 2019-05-20 | Disposition: A | Discharge status: Home / Self Care | Payer: 59 | Attending: Gastroenterology | Admitting: Gastroenterology

## 2019-05-20 ENCOUNTER — Encounter (HOSPITAL_COMMUNITY)
Admission: RE | Disposition: A | Discharge status: Home / Self Care | Payer: Self-pay | Source: Home / Self Care | Attending: Gastroenterology

## 2019-05-20 ENCOUNTER — Encounter (HOSPITAL_COMMUNITY): Payer: Self-pay

## 2019-05-20 DIAGNOSIS — Z9221 Personal history of antineoplastic chemotherapy: Secondary | ICD-10-CM | POA: Diagnosis not present

## 2019-05-20 DIAGNOSIS — Z85048 Personal history of other malignant neoplasm of rectum, rectosigmoid junction, and anus: Secondary | ICD-10-CM | POA: Insufficient documentation

## 2019-05-20 DIAGNOSIS — Z08 Encounter for follow-up examination after completed treatment for malignant neoplasm: Secondary | ICD-10-CM | POA: Diagnosis present

## 2019-05-20 DIAGNOSIS — K219 Gastro-esophageal reflux disease without esophagitis: Secondary | ICD-10-CM | POA: Insufficient documentation

## 2019-05-20 DIAGNOSIS — Z8571 Personal history of Hodgkin lymphoma: Secondary | ICD-10-CM | POA: Diagnosis not present

## 2019-05-20 DIAGNOSIS — E039 Hypothyroidism, unspecified: Secondary | ICD-10-CM | POA: Diagnosis not present

## 2019-05-20 DIAGNOSIS — N189 Chronic kidney disease, unspecified: Secondary | ICD-10-CM | POA: Insufficient documentation

## 2019-05-20 DIAGNOSIS — R5382 Chronic fatigue, unspecified: Secondary | ICD-10-CM | POA: Insufficient documentation

## 2019-05-20 DIAGNOSIS — I4891 Unspecified atrial fibrillation: Secondary | ICD-10-CM | POA: Diagnosis not present

## 2019-05-20 DIAGNOSIS — Z923 Personal history of irradiation: Secondary | ICD-10-CM | POA: Insufficient documentation

## 2019-05-20 DIAGNOSIS — Z941 Heart transplant status: Secondary | ICD-10-CM | POA: Diagnosis not present

## 2019-05-20 DIAGNOSIS — F4321 Adjustment disorder with depressed mood: Secondary | ICD-10-CM | POA: Diagnosis not present

## 2019-05-20 HISTORY — DX: Other intervertebral disc degeneration, lumbar region without mention of lumbar back pain or lower extremity pain: M51.369

## 2019-05-20 HISTORY — DX: Other intervertebral disc displacement, lumbar region: M51.26

## 2019-05-20 HISTORY — DX: Other intervertebral disc degeneration, lumbar region: M51.36

## 2019-05-20 HISTORY — PX: BIOPSY: SHX5522

## 2019-05-20 HISTORY — PX: FLEXIBLE SIGMOIDOSCOPY: SHX5431

## 2019-05-20 SURGERY — SIGMOIDOSCOPY, FLEXIBLE

## 2019-05-20 MED ORDER — SODIUM CHLORIDE 0.9 % IV SOLN: INTRAVENOUS | Status: DC

## 2019-05-20 NOTE — Op Note (Signed)
[facility] Patient Name: Glenn Gonzalez Procedure Date: 05/20/2019 MRN: [id number] Attending MD: Carol Ada , MD Date of Birth: Mar 17, 1961 CSN: [id number] Age: 58 Admit Type: Outpatient Procedure:                Flexible Sigmoidoscopy Indications:              High risk colon cancer surveillance: Personal                            history of colon cancer Providers:                Carol Ada, MD, Elmer Ramp. Tilden Dome, RN, William Dalton, Technician Referring MD:              Medicines:                None Complications:            No immediate complications. Estimated Blood Loss:     Estimated blood loss was minimal. Procedure:                Pre-Anesthesia Assessment:                           - Prior to the procedure, a History and Physical                            was performed, and patient medications and                            allergies were reviewed. The patient's tolerance of                            previous anesthesia was also reviewed. The risks                            and benefits of the procedure and the sedation                            options and risks were discussed with the patient.                            All questions were answered, and informed consent                            was obtained. Prior Anticoagulants: The patient has                            taken no previous anticoagulant or antiplatelet                            agents. ASA Grade Assessment: III - A patient with                            severe  systemic disease. After reviewing the risks                            and benefits, the patient was deemed in                            satisfactory condition to undergo the procedure.                           - No sedation medications were administered.                           After obtaining informed consent, the scope was                            passed under direct vision. The  [id number]-H190 ([id number])                            Olympus gastroscope was introduced through the anus                            and advanced to the the sigmoid colon. The flexible                            sigmoidoscopy was accomplished without difficulty.                            The patient tolerated the procedure well. The                            quality of the bowel preparation was excellent. Scope In: 10:39:25 AM Scope Out: 12:75:17 AM Total Procedure Duration: 0 hours 6 minutes 54 seconds  Findings:      A 4 mm scar was found in the rectum. The scar tissue was healthy in       appearance. The scar was in the 9 o'clock position just proximal to the       first mucosal fold. There was some " stellate puckering" of the       surrounding mucosa, but there was no evidence of any polyp recurrence.       This was biopsied with a cold forceps for histology. Impression:               - Scar in the rectum. Biopsied. Moderate Sedation:      Not Applicable Recommendation:           - Patient has a contact number available for                            emergencies. The signs and symptoms of potential                            delayed complications were discussed with the                            patient. Return to normal activities tomorrow.  Written discharge instructions were provided to the                            patient.                           - Resume regular diet.                           - Repeat colonoscopy in 3 months for surveillance. Procedure Code(s):        --- Professional ---                           [phone], Sigmoidoscopy, flexible; with biopsy, single                            or multiple Diagnosis Code(s):        --- Professional ---                           [id number], Personal history of other malignant                            neoplasm of large intestine                           K62.89, Other specified diseases of anus and  rectum CPT copyright 2019 [employer]. All rights reserved. The codes documented in this report are preliminary and upon coder review may  be revised to meet current compliance requirements. Carol Ada, MD Carol Ada, MD 05/20/2019 10:54:05 AM This report has been signed electronically. Number of Addenda: 0

## 2019-05-20 NOTE — H&P (Signed)
Glenn Gonzalez HPI: The patient is here for follow up of a prior rectal cancer that was identified and removed as a polyp.  Past Medical History:  Diagnosis Date  . A-fib (HCC)    IN PAST  . Bulging lumbar disc    states " i threw my back out on tuesday 6-16, i ahve a bulging disc, but i can lie on my back a side "   . Chronic fatigue   . Chronic kidney disease    CREATININE NOW 1.4   . Colon cancer ([place]) DX 2019   SURGERY DONE  . Complication of anesthesia    AFTER HEART TRANSPLANT TOOK 2 YEARS TO GET BACK TO SELF; patient does not want aneshesia for endoscopy procedures   . Depression    SITUATIONAL  . GERD (gastroesophageal reflux disease)   . History of chemotherapy    DAMAGED HEART MUSCLE WITH RADIATION ALSO  . History of kidney stones   . History of radiation therapy   . Hodgkin's lymphoma ([place]) 1980   IIIB. 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  . Hypothyroidism   . Pneumonia FROM CHF 2016   recurrent pneumonia sedf.  rad tx for lymphoma  . Shortness of breath dyspnea    with exertion  . Skin cancer    AREAS REMOVED FROM [facility], CHECK AND BACK AND HEAD SEPT 2019  . Status post heart transplantation ([place]) 2017    Past Surgical History:  Procedure Laterality Date  . BIOPSY  07/02/2018   Procedure: BIOPSY;  Surgeon: Carol Ada, MD;  Location: [facility];  Service: Endoscopy;;  . CARDIAC CATHETERIZATION N/A 04/20/2015   Procedure: Right/Left Heart Cath and Coronary Angiography;  Surgeon: Jolaine Artist, MD;  Location: [place] [facility];  Service: Cardiovascular;  Laterality: N/A;  . CARDIAC CATHETERIZATION  10-11-15   pt states routine heart cath to be done at [facility]   . CARDIAC CATHETERIZATION N/A 04/02/2016   Procedure: Right Heart Cath;  Surgeon: Jolaine Artist, MD;  Location: [place] [facility];  Service: Cardiovascular;  Laterality: N/A;  . CARDIAC CATHETERIZATION  JULY 2019 AT [facility]  . COLONOSCOPY N/A 07/02/2018   Procedure: COLONOSCOPY;   Surgeon: Carol Ada, MD;  Location: [facility];  Service: Endoscopy;  Laterality: N/A;  . COLONOSCOPY WITH PROPOFOL N/A 08/03/2015   Procedure: COLONOSCOPY WITH PROPOFOL;  Surgeon: Carol Ada, MD;  Location: [facility];  Service: Endoscopy;  Laterality: N/A;  wants to try to do procedure without sedation  . CYSTOSCOPY WITH INSERTION OF UROLIFT N/A 10/18/2018   Procedure: CYSTOSCOPY WITH INSERTION OF UROLIFT;  Surgeon: Cleon Gustin, MD;  Location[facility];  Service: Urology;  Laterality: N/A;  30 MINS  . CYSTOSCOPY WITH RETROGRADE PYELOGRAM, URETEROSCOPY AND STENT PLACEMENT Right 10/12/2015   Procedure: CYSTOSCOPY WITH RETROGRADE PYELOGRAM, URETEROSCOPY AND STENT PLACEMENT;  Surgeon: Cleon Gustin, MD;  Location: [facility];  Service: Urology;  Laterality: Right;  . ESOPHAGOGASTRODUODENOSCOPY    . EXPLORATORY LAPAROTOMY    . gynemastia Bilateral   . HEART TRANSPLANT  06/20/2016   at [facility]  . HOT HEMOSTASIS N/A 07/02/2018   Procedure: HOT HEMOSTASIS (ARGON PLASMA COAGULATION/BICAP);  Surgeon: Carol Ada, MD;  Location: [facility];  Service: Endoscopy;  Laterality: N/A;  . LUMBAR LAMINECTOMY/DECOMPRESSION MICRODISCECTOMY Right 01/04/2016   Procedure: Microdiscectomy Lumbar four Lumbar five right;  Surgeon: Eustace Moore, MD;  Location: [place] [facility];  Service: Neurosurgery;  Laterality: Right;  . neck biopses  x5  . 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 POLYPECTOMY  07/02/2018   Procedure: POLYPECTOMY;  Surgeon: Carol Ada, MD;  Location: [facility];  Service: Endoscopy;;  . PORTACATH PLACEMENT     06-29-15 inserted, now removed.  Marland Kitchen STONE EXTRACTION WITH BASKET Right 10/12/2015   Procedure: STONE EXTRACTION WITH BASKET;  Surgeon: Cleon Gustin, MD;  Location: [facility];  Service: Urology;  Laterality: Right;  . SUBMUCOSAL INJECTION  07/02/2018   Procedure: SUBMUCOSAL INJECTION;  Surgeon: Carol Ada, MD;   Location: [facility];  Service: Endoscopy;;  . TONSILLECTOMY    . VARICELE REPAIR  40 YRS AGO    Family History  Problem Relation Age of Onset  . Hyperthyroidism Mother   . Insomnia Mother   . Hypertension Father   . Depression Father   . Insomnia Father     Social History:  reports that he has never smoked. He has never used smokeless tobacco. He reports that he does not drink alcohol or use drugs.  Allergies:  Allergies  Allergen Reactions  . Digoxin Other (See Comments)    gynecomastia   . Phencyclidine Other (See Comments)    PCP derived antibiotic > Insomnia  . Spironolactone Other (See Comments)    Gynecomastia  . Lactose Intolerance (Gi) Diarrhea and Other (See Comments)    cramps  . Penicillins Other (See Comments)    Cramps Did it involve swelling of the face/tongue/throat, SOB, or low BP? No Did it involve sudden or severe rash/hives, skin peeling, or any reaction on the inside of your mouth or nose? No Did you need to seek medical attention at a hospital or doctor's office? No When did it last happen?unknown If all above answers are "NO", may proceed with cephalosporin use.     . Sulfur Rash    Medications:  Scheduled:  Continuous: . sodium chloride      No results found for this or any previous visit (from the past 24 hour(s)).   No results found.  ROS:  As stated above in the HPI otherwise negative.  Blood pressure (!) 160/75, pulse 89, temperature 98.7 F (37.1 C), temperature source Oral, resp. rate 16, height 6\' 2"  (1.88 m), weight 99.8 kg, SpO2 100 %.    PE: Gen: NAD, Alert and Oriented HEENT:  [place]/AT, EOMI Neck: Supple, no LAD Lungs: CTA Bilaterally CV: RRR without M/G/R ABM: Soft, NTND, +BS Ext: No C/C/E  Assessment/Plan: 1) Rectal cancer - FFS.  Jenniferann Stuckert D 05/20/2019, 10:13 AM

## 2019-05-20 NOTE — Progress Notes (Signed)
Flex sig done with out sedation per Dr. Benson Norway. Dr. Benson Norway  Talked with patient before he left. Pt no pain or discomfort. Pt discharged by Dr.Hung. Report given to patient. VS stable checked during and post procedure.

## 2019-05-24 ENCOUNTER — Encounter (HOSPITAL_COMMUNITY): Payer: Self-pay | Admitting: Gastroenterology

## 2019-06-02 ENCOUNTER — Other Ambulatory Visit: Payer: Self-pay

## 2019-06-02 ENCOUNTER — Ambulatory Visit (INDEPENDENT_AMBULATORY_CARE_PROVIDER_SITE_OTHER): Payer: 59 | Admitting: Podiatry

## 2019-06-02 DIAGNOSIS — B351 Tinea unguium: Secondary | ICD-10-CM | POA: Diagnosis not present

## 2019-06-02 DIAGNOSIS — L603 Nail dystrophy: Secondary | ICD-10-CM

## 2019-06-14 NOTE — Progress Notes (Signed)
Subjective: 58 year old male presents the office today for follow-up evaluation of toenail dystrophy as well likely fungus of the nails.  He denies any pain in the nails and denies redness or drainage or any swelling.  Still using topical medication Denies any systemic complaints such as fevers, chills, nausea, vomiting. No acute changes since last appointment, and no other complaints at this time.   Objective: AAO x3, NAD DP/PT pulses palpable bilaterally, CRT less than 3 seconds Bilateral hallux tones are hypertrophic, dystrophic with yellow-brown discoloration.  Early are starting to be more clear nature they are still thickened.  There is no pain in the nails there is no true redness or drainage or signs of infection. No pain with calf compression, swelling, warmth, erythema  Assessment: Onychodystrophy, onychomycosis bilateral hallux  Plan: -All treatment options discussed with the patient including all alternatives, risks, complications.  -Debrided bilateral hallux nails without any complications or bleeding as a courtesy.  Continue with topical antifungal as well as urea.  Monitoring signs or symptoms of infection or side effects of medications. -Patient encouraged to call the office with any questions, concerns, change in symptoms.   Trula Slade DPM

## 2019-06-28 ENCOUNTER — Telehealth: Payer: Self-pay | Admitting: Hematology

## 2019-06-28 NOTE — Telephone Encounter (Signed)
Received a new hem referral from Dr. Felipa Eth for thrombocytosis. Pt has been cld and scheduled to see Dr. Irene Limbo on 8/6 at 11am. Pt aware to arrive 20 minutes early. Letter mailed.

## 2019-07-06 NOTE — Progress Notes (Signed)
HEMATOLOGY/ONCOLOGY CONSULTATION NOTE  Date of Service: 07/07/2019  Patient Care Team: Lajean Manes, MD as PCP - General (Internal Medicine)  CHIEF COMPLAINTS/PURPOSE OF CONSULTATION:  Thrombocytosis  HISTORY OF PRESENTING ILLNESS:   Glenn Gonzalez is a wonderful 58 y.o. male who has been referred to [place] by Dr. Felipa Eth for evaluation and management of thrombocytosis. The pt reports that he is doing well overall.  The pt reports a hx of Hodgkin's lymphoma in the 1980s. He was treated at [facility] with maximum cycles of MOP and ABVD, as well as spade radiation. Denies bone marrow involvement.    He also has a hx of melanoma. Most of the spots were on his chest around the site of his radiation. He has had over 20 spots removed, the most recent on his scalp. He manages this with [facility].  He was diagnosed with colon cancer last year and has had several polyps removed. He has been getting sigmoidoscopies every 3 months and colonoscopies yearly.  He notes a recent prostate infection, which is being followed by a urologist. His most recent PSA was 2.66.  He is currently taking 2.5 mg prednisone and ASA. He is also taking Doxycycline 100 mg BID after the removal of the spot on his scalp.  Most recent lab results (06/14/2019) of CBC is as follows: all values are WNL except for WBC at 11.3k, HGB at 11.8, HCT at 38.7, MCH at 25.2, MCHC at 30.5, RDW at 18.2, PLT at 513  On review of systems, pt reports bladder pain, body soreness and denies mouth sores, belly pain, mouth soreness, and any other symptoms.   On PMHx the pt reports hx of Hodgkins lymphoma, heart transplant, melanoma, colon cancer, multiple catheter-related blood clots after surgery On FHx the pt reports that his brother had colon cancer with polyps   MEDICAL HISTORY:  Past Medical History:  Diagnosis Date  . A-fib (HCC)    IN PAST  . Bulging lumbar disc    states " i threw my back out on tuesday 6-16,  i ahve a bulging disc, but i can lie on my back a side "   . Chronic fatigue   . Chronic kidney disease    CREATININE NOW 1.4   . Colon cancer ([place]) DX 2019   SURGERY DONE  . Complication of anesthesia    AFTER HEART TRANSPLANT TOOK 2 YEARS TO GET BACK TO SELF; patient does not want aneshesia for endoscopy procedures   . Depression    SITUATIONAL  . GERD (gastroesophageal reflux disease)   . History of chemotherapy    DAMAGED HEART MUSCLE WITH RADIATION ALSO  . History of kidney stones   . History of radiation therapy   . Hodgkin's lymphoma ([place]) 1980   IIIB. 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  . Hypothyroidism   . Pneumonia FROM CHF 2016   recurrent pneumonia sedf.  rad tx for lymphoma  . Shortness of breath dyspnea    with exertion  . Skin cancer    AREAS REMOVED FROM [facility], CHECK AND BACK AND HEAD SEPT 2019  . Status post heart transplantation ([place]) 2017    SURGICAL HISTORY: Past Surgical History:  Procedure Laterality Date  . BIOPSY  07/02/2018   Procedure: BIOPSY;  Surgeon: Carol Ada, MD;  Location: [facility];  Service: Endoscopy;;  . BIOPSY  05/20/2019   Procedure: BIOPSY;  Surgeon: Carol Ada, MD;  Location: [facility];  Service: Endoscopy;;  .  CARDIAC CATHETERIZATION N/A 04/20/2015   Procedure: Right/Left Heart Cath and Coronary Angiography;  Surgeon: Jolaine Artist, MD;  Location: [place] CV LAB;  Service: Cardiovascular;  Laterality: N/A;  . CARDIAC CATHETERIZATION  10-11-15   pt states routine heart cath to be done at [facility]   . CARDIAC CATHETERIZATION N/A 04/02/2016   Procedure: Right Heart Cath;  Surgeon: Jolaine Artist, MD;  Location: [place] CV LAB;  Service: Cardiovascular;  Laterality: N/A;  . CARDIAC CATHETERIZATION  JULY 2019 AT [facility]  . COLONOSCOPY N/A 07/02/2018   Procedure: COLONOSCOPY;  Surgeon: Carol Ada, MD;  Location: [facility];  Service: Endoscopy;  Laterality: N/A;  . COLONOSCOPY WITH PROPOFOL N/A 08/03/2015    Procedure: COLONOSCOPY WITH PROPOFOL;  Surgeon: Carol Ada, MD;  Location: [facility];  Service: Endoscopy;  Laterality: N/A;  wants to try to do procedure without sedation  . CYSTOSCOPY WITH INSERTION OF UROLIFT N/A 10/18/2018   Procedure: CYSTOSCOPY WITH INSERTION OF UROLIFT;  Surgeon: Cleon Gustin, MD;  Location: [facility];  Service: Urology;  Laterality: N/A;  30 MINS  . CYSTOSCOPY WITH RETROGRADE PYELOGRAM, URETEROSCOPY AND STENT PLACEMENT Right 10/12/2015   Procedure: CYSTOSCOPY WITH RETROGRADE PYELOGRAM, URETEROSCOPY AND STENT PLACEMENT;  Surgeon: Cleon Gustin, MD;  Location: [facility];  Service: Urology;  Laterality: Right;  . ESOPHAGOGASTRODUODENOSCOPY    . EXPLORATORY LAPAROTOMY    . FLEXIBLE SIGMOIDOSCOPY N/A 05/20/2019   Procedure: FLEXIBLE SIGMOIDOSCOPY;  Surgeon: Carol Ada, MD;  Location: [facility];  Service: Endoscopy;  Laterality: N/A;  . gynemastia Bilateral   . HEART TRANSPLANT  06/20/2016   at [facility]  . HOT HEMOSTASIS N/A 07/02/2018   Procedure: HOT HEMOSTASIS (ARGON PLASMA COAGULATION/BICAP);  Surgeon: Carol Ada, MD;  Location: [facility];  Service: Endoscopy;  Laterality: N/A;  . LUMBAR LAMINECTOMY/DECOMPRESSION MICRODISCECTOMY Right 01/04/2016   Procedure: Microdiscectomy Lumbar four Lumbar five right;  Surgeon: Eustace Moore, MD;  Location: [place] [facility];  Service: Neurosurgery;  Laterality: Right;  . neck biopses     x5  . 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 POLYPECTOMY  07/02/2018   Procedure: POLYPECTOMY;  Surgeon: Carol Ada, MD;  Location: [facility];  Service: Endoscopy;;  . PORTACATH PLACEMENT     06-29-15 inserted, now removed.  Marland Kitchen STONE EXTRACTION WITH BASKET Right 10/12/2015   Procedure: STONE EXTRACTION WITH BASKET;  Surgeon: Cleon Gustin, MD;  Location: [facility];  Service: Urology;  Laterality: Right;  . SUBMUCOSAL INJECTION  07/02/2018   Procedure: SUBMUCOSAL INJECTION;   Surgeon: Carol Ada, MD;  Location: [facility];  Service: Endoscopy;;  . TONSILLECTOMY    . VARICELE REPAIR  40 YRS AGO    SOCIAL HISTORY: Social History   Socioeconomic History  . Marital status: Married    Spouse name: Not on file  . Number of children: Not on file  . Years of education: Not on file  . Highest education level: Not on file  Occupational History  . Occupation: disabled  Social Needs  . Financial resource strain: Not on file  . Food insecurity    Worry: Not on file    Inability: Not on file  . Transportation needs    Medical: Not on file    Non-medical: Not on file  Tobacco Use  . Smoking status: Never Smoker  . Smokeless tobacco: Never Used  Substance and Sexual Activity  . Alcohol use: No  . Drug use: No  .  Sexual activity: Not on file  Lifestyle  . Physical activity    Days per week: Not on file    Minutes per session: Not on file  . Stress: Not on file  Relationships  . Social Herbalist on phone: Not on file    Gets together: Not on file    Attends religious service: Not on file    Active member of club or organization: Not on file    Attends meetings of clubs or organizations: Not on file    Relationship status: Not on file  . Intimate partner violence    Fear of current or ex partner: Not on file    Emotionally abused: Not on file    Physically abused: Not on file    Forced sexual activity: Not on file  Other Topics Concern  . Not on file  Social History Narrative  . Not on file    FAMILY HISTORY: Family History  Problem Relation Age of Onset  . Hyperthyroidism Mother   . Insomnia Mother   . Hypertension Father   . Depression Father   . Insomnia Father     ALLERGIES:  is allergic to digoxin; phencyclidine; spironolactone; lactose intolerance (gi); penicillins; and sulfur.  MEDICATIONS:  Current Outpatient Medications  Medication Sig Dispense Refill  . acetaminophen (TYLENOL) 650 MG CR tablet Take 1,300 mg by  mouth at bedtime.     Marland Kitchen allopurinol (ZYLOPRIM) 100 MG tablet Take 200 mg by mouth daily.   10  . aspirin EC 81 MG tablet Take 81 mg by mouth daily.     . calcium carbonate (TUMS - DOSED IN MG ELEMENTAL CALCIUM) 500 MG chewable tablet Chew 2 tablets by mouth daily as needed for indigestion or heartburn.    . Calcium Carbonate-Vitamin D3 (RA CALCIUM 600/VITAMIN D-3) 600-400 MG-UNIT TABS Take 1 tablet by mouth 2 (two) times a day.     . cetirizine (ZYRTEC) 10 MG tablet Take 10 mg by mouth at bedtime.     Marland Kitchen ENVARSUS XR 1 MG TB24 Take 2 mg by mouth daily.     Marland Kitchen FLUoxetine (PROZAC) 20 MG tablet Take 20 mg by mouth daily.    . fluticasone (FLONASE) 50 MCG/ACT nasal spray Place 1 spray into both nostrils daily as needed for allergies or rhinitis.     Marland Kitchen levothyroxine (SYNTHROID, LEVOTHROID) 125 MCG tablet Take 125 mcg by mouth daily.     . magnesium oxide (MAG-OX) 400 MG tablet Take 400 mg by mouth daily after lunch.     . Mycophenolate Sodium (MYCOPHENOLIC ACID PO) Take 361 mg by mouth 2 (two) times a day.     . NON FORMULARY Apply 1 application topically daily. [facility] Anti-fungal (Nail)  (3) Refills *added the urea 40% to the anti-fungal (nail) -#1    . predniSONE (DELTASONE) 2.5 MG tablet Take 1 tablet (2.5 mg total) by mouth daily. (Patient taking differently: Take 2.5 mg by mouth daily. ) 20 tablet 0  . RABEprazole (ACIPHEX) 20 MG tablet Take 1 tablet (20 mg total) by mouth 2 (two) times daily. (Patient taking differently: Take 20 mg by mouth 2 (two) times a day. ) 180 tablet 3  . valACYclovir (VALTREX) 500 MG tablet Take 500 mg by mouth 2 (two) times daily as needed (for outbreak).      No current facility-administered medications for this visit.     REVIEW OF SYSTEMS:    A 10+ POINT REVIEW OF SYSTEMS WAS OBTAINED  including neurology, dermatology, psychiatry, cardiac, respiratory, lymph, extremities, GI, GU, Musculoskeletal, constitutional, breasts, reproductive, HEENT.  All  pertinent positives are noted in the HPI.  All others are negative.   PHYSICAL EXAMINATION: ECOG PERFORMANCE STATUS: 0 - Asymptomatic  . Vitals:   07/07/19 1119  BP: 95/65  Pulse: 91  Resp: 18  Temp: 98.3 F (36.8 C)  SpO2: 100%   Filed Weights   07/07/19 1119  Weight: 226 lb 12.8 oz (102.9 kg)   .Body mass index is 29.12 kg/m.   GENERAL:alert, in no acute distress and comfortable SKIN: no acute rashes, no significant lesions EYES: conjunctiva are pink and non-injected, sclera anicteric OROPHARYNX: MMM, no exudates, no oropharyngeal erythema or ulceration NECK: supple, no JVD LYMPH:  no palpable lymphadenopathy in the cervical, axillary or inguinal regions LUNGS: clear to auscultation b/l with normal respiratory effort HEART: regular rate & rhythm ABDOMEN:  normoactive bowel sounds , non tender, not distended. Extremity: no pedal edema PSYCH: alert & oriented x 3 with fluent speech NEURO: no focal motor/sensory deficits  LABORATORY DATA:  I have reviewed the data as listed  . CBC Latest Ref Rng & Units 07/07/2019 10/18/2018 02/24/2018  WBC 4.0 - 10.5 K/uL 11.6(H) - 12.2(H)  Hemoglobin 13.0 - 17.0 g/dL 12.1(L) 12.2(L) 9.3(L)  Hematocrit 39.0 - 52.0 % 40.9 36.0(L) 30.0(L)  Platelets 150 - 400 K/uL 516(H) - 441(H)    . CMP Latest Ref Rng & Units 07/07/2019 10/18/2018 02/24/2018  Glucose 70 - 99 mg/dL 96 92 100(H)  BUN 6 - 20 mg/dL 24(H) 30(H) 17  Creatinine 0.61 - 1.24 mg/dL 1.23 1.30(H) 1.19  Sodium 135 - 145 mmol/L 138 138 137  Potassium 3.5 - 5.1 mmol/L 3.9 4.1 4.0  Chloride 98 - 111 mmol/L 103 101 105  CO2 22 - 32 mmol/L 23 - 25  Calcium 8.9 - 10.3 mg/dL 9.2 - 8.3(L)  Total Protein 6.5 - 8.1 g/dL 6.8 - -  Total Bilirubin 0.3 - 1.2 mg/dL 0.4 - -  Alkaline Phos 38 - 126 U/L 95 - -  AST 15 - 41 U/L 14(L) - -  ALT 0 - 44 U/L 12 - -   05/20/2019 Surgical Pathology:   RADIOGRAPHIC STUDIES: I have personally reviewed the radiological images as listed and agreed  with the findings in the report. No results found.  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 CBC with Differential/Platelet    Standing Status:   Future    Standing Expiration Date:   08/10/2020  . CMP ([place] only)    Standing Status:   Future  Standing Expiration Date:   07/06/2020  . Sedimentation rate    Standing Status:   Future    Standing Expiration Date:   07/06/2020  . JAK2 (including V617F and Exon 12), MPL, and CALR-Next Generation Sequencing    Standing Status:   Future    Standing Expiration Date:   07/06/2020  . Ferritin    Standing Status:   Future    Standing Expiration Date:   07/06/2020  . Iron and TIBC    Standing Status:   Future    Standing Expiration Date:   07/06/2020   All of the patients questions were answered with apparent satisfaction. The patient knows to call the clinic with any problems, questions or concerns.  I spent 45 minutes counseling the patient face to face. The total time spent in the appointment was 49 and  more than 50% was on counseling and direct patient cares.    Sullivan Lone MD MS AAHIVMS [facility] Hematology/Oncology Physician [facility]  (Office):       [phone] (Work cell):  [phone] (Fax):           [phone]  07/07/2019 12:32 PM  I, De Burrs, am acting as a scribe for Dr. Irene Limbo  .I have reviewed the above documentation for accuracy and completeness, and I agree with the above. [name] MD

## 2019-07-07 ENCOUNTER — Telehealth: Payer: Self-pay | Admitting: Hematology

## 2019-07-07 ENCOUNTER — Other Ambulatory Visit: Payer: Self-pay

## 2019-07-07 ENCOUNTER — Inpatient Hospital Stay: Payer: 59 | Attending: Hematology | Admitting: Hematology

## 2019-07-07 ENCOUNTER — Inpatient Hospital Stay: Payer: 59

## 2019-07-07 VITALS — BP 95/65 | HR 91 | Temp 98.3°F | Resp 18 | Ht 74.0 in | Wt 226.8 lb

## 2019-07-07 DIAGNOSIS — Z85820 Personal history of malignant melanoma of skin: Secondary | ICD-10-CM | POA: Diagnosis not present

## 2019-07-07 DIAGNOSIS — D75839 Thrombocytosis, unspecified: Secondary | ICD-10-CM

## 2019-07-07 DIAGNOSIS — E611 Iron deficiency: Secondary | ICD-10-CM | POA: Diagnosis not present

## 2019-07-07 DIAGNOSIS — Z86010 Personal history of colonic polyps: Secondary | ICD-10-CM | POA: Diagnosis not present

## 2019-07-07 DIAGNOSIS — Z950 Presence of cardiac pacemaker: Secondary | ICD-10-CM | POA: Diagnosis not present

## 2019-07-07 DIAGNOSIS — Z85038 Personal history of other malignant neoplasm of large intestine: Secondary | ICD-10-CM | POA: Diagnosis not present

## 2019-07-07 DIAGNOSIS — Z800 Family history of malignant neoplasm of digestive organs: Secondary | ICD-10-CM | POA: Diagnosis not present

## 2019-07-07 DIAGNOSIS — D473 Essential (hemorrhagic) thrombocythemia: Secondary | ICD-10-CM

## 2019-07-07 DIAGNOSIS — D72829 Elevated white blood cell count, unspecified: Secondary | ICD-10-CM

## 2019-07-07 DIAGNOSIS — Z7982 Long term (current) use of aspirin: Secondary | ICD-10-CM | POA: Diagnosis not present

## 2019-07-07 DIAGNOSIS — Z941 Heart transplant status: Secondary | ICD-10-CM

## 2019-07-07 DIAGNOSIS — H6690 Otitis media, unspecified, unspecified ear: Secondary | ICD-10-CM | POA: Insufficient documentation

## 2019-07-07 DIAGNOSIS — Z9081 Acquired absence of spleen: Secondary | ICD-10-CM | POA: Diagnosis not present

## 2019-07-07 DIAGNOSIS — Z8571 Personal history of Hodgkin lymphoma: Secondary | ICD-10-CM

## 2019-07-07 LAB — FERRITIN: Ferritin: 12 ng/mL — ABNORMAL LOW (ref 24–336)

## 2019-07-07 LAB — CBC WITH DIFFERENTIAL/PLATELET
Abs Immature Granulocytes: 0.05 10*3/uL (ref 0.00–0.07)
Basophils Absolute: 0.1 10*3/uL (ref 0.0–0.1)
Basophils Relative: 0 %
Eosinophils Absolute: 0.1 10*3/uL (ref 0.0–0.5)
Eosinophils Relative: 1 %
HCT: 40.9 % (ref 39.0–52.0)
Hemoglobin: 12.1 g/dL — ABNORMAL LOW (ref 13.0–17.0)
Immature Granulocytes: 0 %
Lymphocytes Relative: 9 %
Lymphs Abs: 1 10*3/uL (ref 0.7–4.0)
MCH: 25.1 pg — ABNORMAL LOW (ref 26.0–34.0)
MCHC: 29.6 g/dL — ABNORMAL LOW (ref 30.0–36.0)
MCV: 84.9 fL (ref 80.0–100.0)
Monocytes Absolute: 1.3 10*3/uL — ABNORMAL HIGH (ref 0.1–1.0)
Monocytes Relative: 11 %
Neutro Abs: 9.1 10*3/uL — ABNORMAL HIGH (ref 1.7–7.7)
Neutrophils Relative %: 79 %
Platelets: 516 10*3/uL — ABNORMAL HIGH (ref 150–400)
RBC: 4.82 MIL/uL (ref 4.22–5.81)
RDW: 18.5 % — ABNORMAL HIGH (ref 11.5–15.5)
WBC: 11.6 10*3/uL — ABNORMAL HIGH (ref 4.0–10.5)
nRBC: 0 % (ref 0.0–0.2)

## 2019-07-07 LAB — CMP (CANCER CENTER ONLY)
ALT: 12 U/L (ref 0–44)
AST: 14 U/L — ABNORMAL LOW (ref 15–41)
Albumin: 3.9 g/dL (ref 3.5–5.0)
Alkaline Phosphatase: 95 U/L (ref 38–126)
Anion gap: 12 (ref 5–15)
BUN: 24 mg/dL — ABNORMAL HIGH (ref 6–20)
CO2: 23 mmol/L (ref 22–32)
Calcium: 9.2 mg/dL (ref 8.9–10.3)
Chloride: 103 mmol/L (ref 98–111)
Creatinine: 1.23 mg/dL (ref 0.61–1.24)
GFR, Est AFR Am: >60 mL/min (ref >60)
GFR, Estimated: >60 mL/min (ref >60)
Glucose, Bld: 96 mg/dL (ref 70–99)
Potassium: 3.9 mmol/L (ref 3.5–5.1)
Sodium: 138 mmol/L (ref 135–145)
Total Bilirubin: 0.4 mg/dL (ref 0.3–1.2)
Total Protein: 6.8 g/dL (ref 6.5–8.1)

## 2019-07-07 LAB — IRON AND TIBC
Iron: 25 ug/dL — ABNORMAL LOW (ref 42–163)
Saturation Ratios: 6 % — ABNORMAL LOW (ref 20–55)
TIBC: 408 ug/dL (ref 202–409)
UIBC: 383 ug/dL — ABNORMAL HIGH (ref 117–376)

## 2019-07-07 LAB — SEDIMENTATION RATE: Sed Rate: 9 mm/hr (ref 0–16)

## 2019-07-07 NOTE — Telephone Encounter (Signed)
Scheduled appt per 8/6 los.  Patient declined calendar and avs. He is Pharmacist, community active

## 2019-07-14 ENCOUNTER — Telehealth: Payer: Self-pay | Admitting: *Deleted

## 2019-07-14 NOTE — Telephone Encounter (Signed)
Per Dr. Irene Limbo, he was going to talk to him about PO vs IV Iron during the upcoming appointment. He has some additional labs that have not resulted. He can begin eating food with more iron and if he prefers can start taking Iron polysaccharide 150mg  po daily OTC. He could also wait till 8/20 to discuss IV vs PO iron replacement.  Attempted to contact patient with this information. Left information on VM and encouraged to contact office for further questions. Marland Kitchen

## 2019-07-14 NOTE — Telephone Encounter (Signed)
Patient called - LVM: Blood work done 8/6 and he saw results of Iron 25 and Ferritin 12. These are very low and he wants to know what he should be doing. Has f/u phone appt on 8/20 to discuss other labs, but wondered if he should be doing something about these now.

## 2019-07-18 NOTE — Progress Notes (Signed)
HEMATOLOGY/ONCOLOGY CONSULTATION NOTE  Date of Service: 07/18/2019  Patient Care Team: Lajean Manes, MD as PCP - General (Internal Medicine)  CHIEF COMPLAINTS/PURPOSE OF CONSULTATION:  Thrombocytosis Iron deficiency Leucocytosis   HISTORY OF PRESENTING ILLNESS:   Glenn Gonzalez is a wonderful 58 y.o. male who has been referred to [place] by Dr. Felipa Eth for evaluation and management of thrombocytosis. The pt reports that he is doing well overall.  The pt reports a hx of Hodgkin's lymphoma in the 1980s. He was treated at [facility] with maximum cycles of MOP and ABVD, as well as spade radiation. Denies bone marrow involvement.    He also has a hx of melanoma. Most of the spots were on his chest around the site of his radiation. He has had over 20 spots removed, the most recent on his scalp. He manages this with [facility].  He was diagnosed with colon cancer last year and has had several polyps removed. He has been getting sigmoidoscopies every 3 months and colonoscopies yearly.  He notes a recent prostate infection, which is being followed by a urologist. His most recent PSA was 2.66.  He is currently taking 2.5 mg prednisone and ASA. He is also taking Doxycycline 100 mg BID after the removal of the spot on his scalp.  Most recent lab results (06/14/2019) of CBC is as follows: all values are WNL except for WBC at 11.3k, HGB at 11.8, HCT at 38.7, MCH at 25.2, MCHC at 30.5, RDW at 18.2, PLT at 513  On review of systems, pt reports bladder pain, body soreness and denies mouth sores, belly pain, mouth soreness, and any other symptoms.   On PMHx the pt reports hx of Hodgkins lymphoma, heart transplant, melanoma, colon cancer, multiple catheter-related blood clots after surgery On FHx the pt reports that his brother had colon cancer with polyps  Interval History  I connected with Glenn Gonzalez on 07/21/2019 at 11:40 AM EDT by telephone and verified that I am  speaking with the correct person using two identifiers.  I discussed the limitations, risks, security and privacy concerns of performing an evaluation and management service by telemedicine and the availability of in-person appointments. I also discussed with the patient that there may be a patient responsible charge related to this service. The patient expressed understanding and agreed to proceed.   Other persons participating in the visit and their role in the encounter:     -Yevette Edwards, [place], Medical Scribe  Patient's location: home Provider's location: my office at the [place] is a 57 y.o. male being called today for the management and evaluation of thrombocytosis and leukocytosis. The patient's last visit with [place] was on 07/07/2019. The pt reports that he is doing well overall.  The pt reports that he is feeling better since his last visit. He has a current ear infection, started today on Cefuroxime. He is taking a baby asprin and Ferrous Sulfate 150mg  PO. Pt has been on Aciphex for about 15 years.  Most recent lab results from 07/07/2019 of CBC w/diff and CMP is as follows: all values are WNL except for WBC at 11.6k, HGB at 12.1, MCH at 25.1, MCHC at 29.6, RDW at 18.5, PLT at 516k, neutro abs at 9.1k, monocytes abs at 1.3k, BUN at 24, AST at 14. 07/07/2019 JAK2 is pending 07/07/2019 sed rate at 9 07/07/2019 Iron and TIBC revealed Iron at 25, sat ratios at 6,  UIBC at 383 07/07/2019 Ferritin at 12  On review of systems, pt reports extreme fatigue and denies any black stool and blood in the stools, belly pain and any other symptoms.    MEDICAL HISTORY:  Past Medical History:  Diagnosis Date  . A-fib (HCC)    IN PAST  . Bulging lumbar disc    states " i threw my back out on tuesday 6-16, i ahve a bulging disc, but i can lie on my back a side "   . Chronic fatigue   . Chronic kidney disease    CREATININE NOW 1.4    . Colon cancer ([place]) DX 2019   SURGERY DONE  . Complication of anesthesia    AFTER HEART TRANSPLANT TOOK 2 YEARS TO GET BACK TO SELF; patient does not want aneshesia for endoscopy procedures   . Depression    SITUATIONAL  . GERD (gastroesophageal reflux disease)   . History of chemotherapy    DAMAGED HEART MUSCLE WITH RADIATION ALSO  . History of kidney stones   . History of radiation therapy   . Hodgkin's lymphoma ([place]) 1980   IIIB. 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  . Hypothyroidism   . Pneumonia FROM CHF 2016   recurrent pneumonia sedf.  rad tx for lymphoma  . Shortness of breath dyspnea    with exertion  . Skin cancer    AREAS REMOVED FROM [facility], CHECK AND BACK AND HEAD SEPT 2019  . Status post heart transplantation ([place]) 2017    SURGICAL HISTORY: Past Surgical History:  Procedure Laterality Date  . BIOPSY  07/02/2018   Procedure: BIOPSY;  Surgeon: Carol Ada, MD;  Location: [facility];  Service: Endoscopy;;  . BIOPSY  05/20/2019   Procedure: BIOPSY;  Surgeon: Carol Ada, MD;  Location: [facility];  Service: Endoscopy;;  . CARDIAC CATHETERIZATION N/A 04/20/2015   Procedure: Right/Left Heart Cath and Coronary Angiography;  Surgeon: Jolaine Artist, MD;  Location: [place] [facility];  Service: Cardiovascular;  Laterality: N/A;  . CARDIAC CATHETERIZATION  10-11-15   pt states routine heart cath to be done at [facility]   . CARDIAC CATHETERIZATION N/A 04/02/2016   Procedure: Right Heart Cath;  Surgeon: Jolaine Artist, MD;  Location: [place] [facility];  Service: Cardiovascular;  Laterality: N/A;  . CARDIAC CATHETERIZATION  JULY 2019 AT [facility]  . COLONOSCOPY N/A 07/02/2018   Procedure: COLONOSCOPY;  Surgeon: Carol Ada, MD;  [facility]: [facility];  Service: Endoscopy;  Laterality: N/A;  . COLONOSCOPY WITH PROPOFOL N/A 08/03/2015   Procedure: COLONOSCOPY WITH PROPOFOL;  Surgeon: Carol Ada, MD;  Location: [facility];  Service: Endoscopy;  Laterality: N/A;   wants to try to do procedure without sedation  . CYSTOSCOPY WITH INSERTION OF UROLIFT N/A 10/18/2018   Procedure: CYSTOSCOPY WITH INSERTION OF UROLIFT;  Surgeon: Cleon Gustin, MD;  Location: [facility];  Service: Urology;  Laterality: N/A;  30 MINS  . CYSTOSCOPY WITH RETROGRADE PYELOGRAM, URETEROSCOPY AND STENT PLACEMENT Right 10/12/2015   Procedure: CYSTOSCOPY WITH RETROGRADE PYELOGRAM, URETEROSCOPY AND STENT PLACEMENT;  Surgeon: Cleon Gustin, MD;  Location: [facility];  Service: Urology;  Laterality: Right;  . ESOPHAGOGASTRODUODENOSCOPY    . EXPLORATORY LAPAROTOMY    . FLEXIBLE SIGMOIDOSCOPY N/A 05/20/2019   Procedure: FLEXIBLE SIGMOIDOSCOPY;  Surgeon: Carol Ada, MD;  Location: [facility];  Service: Endoscopy;  Laterality: N/A;  . gynemastia Bilateral   . HEART TRANSPLANT  06/20/2016   at [facility]  . HOT  HEMOSTASIS N/A 07/02/2018   Procedure: HOT HEMOSTASIS (ARGON PLASMA COAGULATION/BICAP);  Surgeon: Carol Ada, MD;  Location: [facility];  Service: Endoscopy;  Laterality: N/A;  . LUMBAR LAMINECTOMY/DECOMPRESSION MICRODISCECTOMY Right 01/04/2016   Procedure: Microdiscectomy Lumbar four Lumbar five right;  Surgeon: Eustace Moore, MD;  Location: [facility];  Service: Neurosurgery;  Laterality: Right;  . neck biopses     x5  . 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 POLYPECTOMY  07/02/2018   Procedure: POLYPECTOMY;  Surgeon: Carol Ada, MD;  Location: [facility];  Service: Endoscopy;;  . PORTACATH PLACEMENT     06-29-15 inserted, now removed.  Marland Kitchen STONE EXTRACTION WITH BASKET Right 10/12/2015   Procedure: STONE EXTRACTION WITH BASKET;  Surgeon: Cleon Gustin, MD;  Location: [facility];  Service: Urology;  Laterality: Right;  . SUBMUCOSAL INJECTION  07/02/2018   Procedure: SUBMUCOSAL INJECTION;  Surgeon: Carol Ada, MD;  [facility]: [facility];  Service: Endoscopy;;  . TONSILLECTOMY    . VARICELE REPAIR  40 YRS AGO     SOCIAL HISTORY: Social History   Socioeconomic History  . Marital status: Married    Spouse name: Not on file  . Number of children: Not on file  . Years of education: Not on file  . Highest education level: Not on file  Occupational History  . Occupation: disabled  Social Needs  . Financial resource strain: Not on file  . Food insecurity    Worry: Not on file    Inability: Not on file  . Transportation needs    Medical: Not on file    Non-medical: Not on file  Tobacco Use  . Smoking status: Never Smoker  . Smokeless tobacco: Never Used  Substance and Sexual Activity  . Alcohol use: No  . Drug use: No  . Sexual activity: Not on file  Lifestyle  . Physical activity    Days per week: Not on file    Minutes per session: Not on file  . Stress: Not on file  Relationships  . Social Herbalist on phone: Not on file    Gets together: Not on file    Attends religious service: Not on file    Active member of club or organization: Not on file    Attends meetings of clubs or organizations: Not on file    Relationship status: Not on file  . Intimate partner violence    Fear of current or ex partner: Not on file    Emotionally abused: Not on file    Physically abused: Not on file    Forced sexual activity: Not on file  Other Topics Concern  . Not on file  Social History Narrative  . Not on file    FAMILY HISTORY: Family History  Problem Relation Age of Onset  . Hyperthyroidism Mother   . Insomnia Mother   . Hypertension Father   . Depression Father   . Insomnia Father     ALLERGIES:  is allergic to digoxin; phencyclidine; spironolactone; lactose intolerance (gi); penicillins; and sulfur.  MEDICATIONS:  Current Outpatient Medications  Medication Sig Dispense Refill  . acetaminophen (TYLENOL) 650 MG CR tablet Take 1,300 mg by mouth at bedtime.     Marland Kitchen allopurinol (ZYLOPRIM) 100 MG tablet Take 200 mg by mouth daily.   10  . aspirin EC 81 MG tablet Take 81  mg by mouth daily.     . calcium carbonate (TUMS -  DOSED IN MG ELEMENTAL CALCIUM) 500 MG chewable tablet Chew 2 tablets by mouth daily as needed for indigestion or heartburn.    . Calcium Carbonate-Vitamin D3 (RA CALCIUM 600/VITAMIN D-3) 600-400 MG-UNIT TABS Take 1 tablet by mouth 2 (two) times a day.     . cetirizine (ZYRTEC) 10 MG tablet Take 10 mg by mouth at bedtime.     Marland Kitchen ENVARSUS XR 1 MG TB24 Take 2 mg by mouth daily.     Marland Kitchen FLUoxetine (PROZAC) 20 MG tablet Take 20 mg by mouth daily.    . fluticasone (FLONASE) 50 MCG/ACT nasal spray Place 1 spray into both nostrils daily as needed for allergies or rhinitis.     Marland Kitchen levothyroxine (SYNTHROID, LEVOTHROID) 125 MCG tablet Take 125 mcg by mouth daily.     . magnesium oxide (MAG-OX) 400 MG tablet Take 400 mg by mouth daily after lunch.     . Mycophenolate Sodium (MYCOPHENOLIC ACID PO) Take 562 mg by mouth 2 (two) times a day.     . NON FORMULARY Apply 1 application topically daily. [facility] Anti-fungal (Nail)  (3) Refills *added the urea 40% to the anti-fungal (nail) -#1    . predniSONE (DELTASONE) 2.5 MG tablet Take 1 tablet (2.5 mg total) by mouth daily. (Patient taking differently: Take 2.5 mg by mouth daily. ) 20 tablet 0  . RABEprazole (ACIPHEX) 20 MG tablet Take 1 tablet (20 mg total) by mouth 2 (two) times daily. (Patient taking differently: Take 20 mg by mouth 2 (two) times a day. ) 180 tablet 3  . valACYclovir (VALTREX) 500 MG tablet Take 500 mg by mouth 2 (two) times daily as needed (for outbreak).      No current facility-administered medications for this visit.     REVIEW OF SYSTEMS:    A 10+ POINT REVIEW OF SYSTEMS WAS OBTAINED including neurology, dermatology, psychiatry, cardiac, respiratory, lymph, extremities, GI, GU, Musculoskeletal, constitutional, breasts, reproductive, HEENT.  All pertinent positives are noted in the HPI.  All others are negative.   PHYSICAL EXAMINATION: ECOG PERFORMANCE STATUS: 0 - Asymptomatic   . There were no vitals filed for this visit. There were no vitals filed for this visit. .There is no height or weight on file to calculate BMI.   Phone Visit  LABORATORY DATA:  I have reviewed the data as listed  . CBC Latest Ref Rng & Units 07/07/2019 10/18/2018 02/24/2018  WBC 4.0 - 10.5 K/uL 11.6(H) - 12.2(H)  Hemoglobin 13.0 - 17.0 g/dL 12.1(L) 12.2(L) 9.3(L)  Hematocrit 39.0 - 52.0 % 40.9 36.0(L) 30.0(L)  Platelets 150 - 400 K/uL 516(H) - 441(H)    . CMP Latest Ref Rng & Units 07/07/2019 10/18/2018 02/24/2018  Glucose 70 - 99 mg/dL 96 92 100(H)  BUN 6 - 20 mg/dL 24(H) 30(H) 17  Creatinine 0.61 - 1.24 mg/dL 1.23 1.30(H) 1.19  Sodium 135 - 145 mmol/L 138 138 137  Potassium 3.5 - 5.1 mmol/L 3.9 4.1 4.0  Chloride 98 - 111 mmol/L 103 101 105  CO2 22 - 32 mmol/L 23 - 25  Calcium 8.9 - 10.3 mg/dL 9.2 - 8.3(L)  Total Protein 6.5 - 8.1 g/dL 6.8 - -  Total Bilirubin 0.3 - 1.2 mg/dL 0.4 - -  Alkaline Phos 38 - 126 U/L 95 - -  AST 15 - 41 U/L 14(L) - -  ALT 0 - 44 U/L 12 - -   . Lab Results  Component Value Date   IRON 25 (L) 07/07/2019   TIBC  408 07/07/2019   IRONPCTSAT 6 (L) 07/07/2019   (Iron and TIBC)  Lab Results  Component Value Date   FERRITIN 12 (L) 07/07/2019    05/20/2019 Surgical Pathology:   RADIOGRAPHIC STUDIES: I have personally reviewed the radiological images as listed and agreed with the findings in the report. No results found.  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 Currently iron deficient at 25. -Discussed his splenectomy is the likely reason for his thrombocytosis and leukocytosis.  -Ferritin goal = >100 -Mutation studies for ET JAK2, CALR, MPL  were negative.  -Discussed the possible causes of iron deficiency including: Aciphex use or blood loss from recent polyp removal. -Discussed Tx options for iron deficiency which include: PO iron supplements or IV iron. Pt would like to proceed with IV iron. -Suggested testing Vitamin B-12 and Vitamin D levels.  -Discussed that managing his heart transplant is his priority at this time and that the possibility of Hodgkin's recurrence is unlikely -Recommend staying up to date with age appropriate cancer screening -Proceed with colonoscopy in 2 months as scheduled.  -Will schedule additional blood work today.  -Will see pt back in 2 months   IV Injectafer weekly x 2 doses ASAP Labs with 1st dose of IV Injectafer   No orders of the defined types were placed in this encounter.  All of the patients questions were answered with apparent satisfaction. The patient knows to call the clinic with any problems, questions or concerns.  The total time spent in the appt was 20 minutes and more than 50% was on counseling and direct patient cares.   Sullivan Lone MD MS AAHIVMS [facility] Hematology/Oncology Physician [facility]  (Office):       [phone] (Work cell):  [phone] (Fax):           [phone]  07/18/2019 2:52 PM  I, De Burrs, am acting as a scribe for Dr. Irene Limbo  .I have reviewed the above documentation for accuracy and completeness, and I agree with the above. [name] MD

## 2019-07-19 ENCOUNTER — Encounter (HOSPITAL_COMMUNITY): Payer: Self-pay | Admitting: Emergency Medicine

## 2019-07-19 ENCOUNTER — Ambulatory Visit (HOSPITAL_COMMUNITY)
Admission: EM | Admit: 2019-07-19 | Discharge: 2019-07-19 | Disposition: A | Discharge status: Home / Self Care | Payer: 59 | Attending: Family Medicine | Admitting: Family Medicine

## 2019-07-19 ENCOUNTER — Other Ambulatory Visit: Payer: Self-pay

## 2019-07-19 DIAGNOSIS — H9202 Otalgia, left ear: Secondary | ICD-10-CM

## 2019-07-19 MED ORDER — CEFUROXIME AXETIL 250 MG PO TABS: 250.0000 mg | ORAL_TABLET | Freq: Two times a day (BID) | ORAL | 0 refills | Status: DC

## 2019-07-19 NOTE — Discharge Instructions (Signed)
Continue Flonase and allergy medicine Drink plenty of fluids I expect you will see improvement in a couple of days If you have increasing pain, or fever fill and take the antibiotic prescribed

## 2019-07-19 NOTE — ED Provider Notes (Signed)
[place]    CSN: [id number] Arrival date & time: 07/19/19  1201      History   Chief Complaint Chief Complaint  Patient presents with  . Otalgia    HPI Glenn Gonzalez is a 58 y.o. male.   HPI  Patient has ear pain since yesterday.  He has known allergies.  He is on Zyrtec and Flonase.  He has been using them regularly.  [place].  He has not been swimming.  No water in the ear.  The hearing is normal.  He is getting shooting pains in the left ear.  Comes and goes.  Although very faint also in the jaw joint.  No drainage from the ear.  No runny or stuffy nose no sore throat, no fever or illness.  No prior ear problems  Past Medical History:  Diagnosis Date  . A-fib (HCC)    IN PAST  . Bulging lumbar disc    states " i threw my back out on tuesday 6-16, i ahve a bulging disc, but i can lie on my back a side "   . Chronic fatigue   . Chronic kidney disease    CREATININE NOW 1.4   . Colon cancer ([place]) DX 2019   SURGERY DONE  . Complication of anesthesia    AFTER HEART TRANSPLANT TOOK 2 YEARS TO GET BACK TO SELF; patient does not want aneshesia for endoscopy procedures   . Depression    SITUATIONAL  . GERD (gastroesophageal reflux disease)   . History of chemotherapy    DAMAGED HEART MUSCLE WITH RADIATION ALSO  . History of kidney stones   . History of radiation therapy   . Hodgkin's lymphoma ([place]) 1980   IIIB. 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  . Hypothyroidism   . Pneumonia FROM CHF 2016   recurrent pneumonia sedf.  rad tx for lymphoma  . Shortness of breath dyspnea    with exertion  . Skin cancer    AREAS REMOVED FROM [facility], CHECK AND BACK AND HEAD SEPT 2019  . Status post heart transplantation [facility]) 2017    Patient Active Problem List   Diagnosis Date Noted  . Onychomycosis 11/08/2018  . Sepsis due to gram-negative UTI ([place]) 02/22/2018  . Status post heart transplantation ([place]) 02/22/2018  . Acute  kidney injury superimposed on CKD ([place]) 02/22/2018  . Hypothyroidism 02/22/2018  . Atrial fibrillation ([place]) [I48.91] 05/15/2016  . S/P lumbar laminectomy 01/04/2016  . Palpitations 04/11/2015  . Dyspnea 01/02/2015  . Atrial tachycardia ([place]) 09/19/2014  . Fatigue 08/10/2014  . Hypotension 10/23/2013  . Insomnia 08/04/2013  . Chronotropic incompetence 05/31/2013  . Biventricular implantable cardioverter-defibrillator BSx 05/31/2013  . Nonischemic cardiomyopathy ([place]) 10/10/2011  . SHORTNESS OF BREATH 06/20/2010  . VENTRICULAR TACHYCARDIA 06/04/2010  . SYSTOLIC HEART FAILURE, CHRONIC 06/04/2010  . DEPRESSION 03/15/2008  . VERTIGO, BENIGN PAROXYSMAL POSITION 09/23/2007  . NEOPLASM, BENIGN, SKIN, TRUNK 08/26/2007  . VIRAL URI 08/14/2007    Past Surgical History:  Procedure Laterality Date  . BIOPSY  07/02/2018   Procedure: BIOPSY;  Surgeon: Carol Ada, MD;  [facility];  Service: Endoscopy;;  . BIOPSY  05/20/2019   Procedure: BIOPSY;  Surgeon: Carol Ada, MD;  [facility];  Service: Endoscopy;;  . CARDIAC CATHETERIZATION N/A 04/20/2015   Procedure: Right/Left Heart Cath and Coronary Angiography;  Surgeon: Jolaine Artist, MD;  Location: [place] [facility];  Service: Cardiovascular;  Laterality: N/A;  . CARDIAC CATHETERIZATION  10-11-15   pt states routine heart cath to be done at [facility]   . CARDIAC CATHETERIZATION N/A 04/02/2016   Procedure: Right Heart Cath;  Surgeon: Jolaine Artist, MD;  Location: [place] CV LAB;  Service: Cardiovascular;  Laterality: N/A;  . CARDIAC CATHETERIZATION  JULY 2019 AT [facility]  . COLONOSCOPY N/A 07/02/2018   Procedure: COLONOSCOPY;  Surgeon: Carol Ada, MD;  [facility];  Service: Endoscopy;  Laterality: N/A;  . COLONOSCOPY WITH PROPOFOL N/A 08/03/2015   Procedure: COLONOSCOPY WITH PROPOFOL;  Surgeon: Carol Ada, MD;  [facility];  Service: Endoscopy;  Laterality: N/A;  wants to try to do procedure  without sedation  . CYSTOSCOPY WITH INSERTION OF UROLIFT N/A 10/18/2018   Procedure: CYSTOSCOPY WITH INSERTION OF UROLIFT;  Surgeon: Cleon Gustin, MD;  Location: [facility];  Service: Urology;  Laterality: N/A;  30 MINS  . CYSTOSCOPY WITH RETROGRADE PYELOGRAM, URETEROSCOPY AND STENT PLACEMENT Right 10/12/2015   Procedure: CYSTOSCOPY WITH RETROGRADE PYELOGRAM, URETEROSCOPY AND STENT PLACEMENT;  Surgeon: Cleon Gustin, MD;  [facility];  Service: Urology;  Laterality: Right;  . ESOPHAGOGASTRODUODENOSCOPY    . EXPLORATORY LAPAROTOMY    . FLEXIBLE SIGMOIDOSCOPY N/A 05/20/2019   Procedure: FLEXIBLE SIGMOIDOSCOPY;  Surgeon: Carol Ada, MD;  [facility];  Service: Endoscopy;  Laterality: N/A;  . gynemastia Bilateral   . HEART TRANSPLANT  06/20/2016   at [facility]  . HOT HEMOSTASIS N/A 07/02/2018   Procedure: HOT HEMOSTASIS (ARGON PLASMA COAGULATION/BICAP);  Surgeon: Carol Ada, MD;  [facility];  Service: Endoscopy;  Laterality: N/A;  . LUMBAR LAMINECTOMY/DECOMPRESSION MICRODISCECTOMY Right 01/04/2016   Procedure: Microdiscectomy Lumbar four Lumbar five right;  Surgeon: Eustace Moore, MD;  Location: [place] [facility];  Service: Neurosurgery;  Laterality: Right;  . neck biopses     x5  . 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 POLYPECTOMY  07/02/2018   Procedure: POLYPECTOMY;  Surgeon: Carol Ada, MD;  [facility];  Service: Endoscopy;;  . PORTACATH PLACEMENT     06-29-15 inserted, now removed.  Marland Kitchen STONE EXTRACTION WITH BASKET Right 10/12/2015   Procedure: STONE EXTRACTION WITH BASKET;  Surgeon: Cleon Gustin, MD;  [facility];  Service: Urology;  Laterality: Right;  . SUBMUCOSAL INJECTION  07/02/2018   Procedure: SUBMUCOSAL INJECTION;  Surgeon: Carol Ada, MD;  [facility];  Service: Endoscopy;;  . TONSILLECTOMY    . VARICELE REPAIR  40 YRS AGO       Home Medications     Prior to Admission medications   Medication Sig Start Date End Date Taking? Authorizing Provider  acetaminophen (TYLENOL) 650 MG CR tablet Take 1,300 mg by mouth at bedtime.     [provider]  allopurinol (ZYLOPRIM) 100 MG tablet Take 200 mg by mouth daily.  01/04/18   [provider]  aspirin EC 81 MG tablet Take 81 mg by mouth daily.     [provider]  calcium carbonate (TUMS - DOSED IN MG ELEMENTAL CALCIUM) 500 MG chewable tablet Chew 2 tablets by mouth daily as needed for indigestion or heartburn.    [provider]  Calcium Carbonate-Vitamin D3 (RA CALCIUM 600/VITAMIN D-3) 600-400 MG-UNIT TABS Take 1 tablet by mouth 2 (two) times a day.     [provider]  cefUROXime (CEFTIN) 250 MG tablet Take 1 tablet (250 mg total) by mouth 2 (two)  times daily with a meal. 07/19/19   Raylene Everts, MD  cetirizine (ZYRTEC) 10 MG tablet Take 10 mg by mouth at bedtime.     [provider]  ENVARSUS XR 1 MG TB24 Take 2 mg by mouth daily.  12/22/17   [provider]  FLUoxetine (PROZAC) 20 MG tablet Take 20 mg by mouth daily.    [provider]  fluticasone (FLONASE) 50 MCG/ACT nasal spray Place 1 spray into both nostrils daily as needed for allergies or rhinitis.     [provider]  levothyroxine (SYNTHROID, LEVOTHROID) 125 MCG tablet Take 125 mcg by mouth daily.  01/09/16   [provider]  magnesium oxide (MAG-OX) 400 MG tablet Take 400 mg by mouth daily after lunch.     [provider]  Mycophenolate Sodium (MYCOPHENOLIC ACID PO) Take 726 mg by mouth 2 (two) times a day.     [provider]  NON FORMULARY Apply 1 application topically daily. [facility] Anti-fungal (Nail)  (3) Refills *added the urea 40% to the anti-fungal (nail) -#1    [provider]  predniSONE (DELTASONE) 2.5 MG tablet Take 1 tablet (2.5 mg total) by mouth daily. Patient taking differently: Take 2.5 mg by  mouth daily.  02/25/18   Regalado, Belkys A, MD  RABEprazole (ACIPHEX) 20 MG tablet Take 1 tablet (20 mg total) by mouth 2 (two) times daily. Patient taking differently: Take 20 mg by mouth 2 (two) times a day.  03/07/11   Bensimhon, Shaune Pascal, MD  valACYclovir (VALTREX) 500 MG tablet Take 500 mg by mouth 2 (two) times daily as needed (for outbreak).     [provider]    Family History Family History  Problem Relation Age of Onset  . Hyperthyroidism Mother   . Insomnia Mother   . Hypertension Father   . Depression Father   . Insomnia Father     Social History Social History   Tobacco Use  . Smoking status: Never Smoker  . Smokeless tobacco: Never Used  Substance Use Topics  . Alcohol use: No  . Drug use: No     Allergies   Digoxin, Phencyclidine, Spironolactone, Lactose intolerance (gi), Penicillins, and Sulfur   Review of Systems Review of Systems  Constitutional: Negative for chills and fever.  HENT: Positive for ear pain. Negative for sore throat.   Eyes: Negative for pain and visual disturbance.  Respiratory: Negative for cough and shortness of breath.   Cardiovascular: Negative for chest pain and palpitations.  Gastrointestinal: Negative for abdominal pain and vomiting.  Genitourinary: Negative for dysuria and hematuria.  Musculoskeletal: Negative for arthralgias and back pain.  Skin: Negative for color change and rash.  Neurological: Negative for seizures and syncope.  All other systems reviewed and are negative.    Physical Exam Triage Vital Signs ED Triage Vitals  Enc Vitals Group     BP 07/19/19 1240 98/64     Pulse Rate 07/19/19 1240 82     Resp 07/19/19 1240 18     Temp 07/19/19 1240 98.6 F (37 C)     Temp Source 07/19/19 1240 Oral     SpO2 07/19/19 1240 98 %     Weight --      Height --      Head Circumference --      Peak Flow --      Pain Score 07/19/19 1241 4     Pain Loc --      Pain Edu? --  Excl. in GC? --    No data  found.  Updated Vital Signs BP 98/64 (BP Location: Right Arm) Comment: normal for pt  Pulse 82   Temp 98.6 F (37 C) (Oral)   Resp 18   SpO2 98%      Physical Exam Constitutional:      General: He is not in acute distress.    Appearance: He is well-developed and normal weight.  HENT:     Head: Normocephalic and atraumatic.     Right Ear: Tympanic membrane, ear canal and external ear normal. There is no impacted cerumen.     Left Ear: Ear canal and external ear normal. There is no impacted cerumen.     Ears:     Comments: Left TM has a normal light reflex with serous fluid, yellow, posterior to the drum    Nose: Nose normal.     Mouth/Throat:     Mouth: Mucous membranes are moist.     Pharynx: No posterior oropharyngeal erythema.  Eyes:     Conjunctiva/sclera: Conjunctivae normal.     Pupils: Pupils are equal, round, and reactive to light.  Neck:     Musculoskeletal: Normal range of motion.  Cardiovascular:     Rate and Rhythm: Normal rate.  Pulmonary:     Effort: Pulmonary effort is normal. No respiratory distress.  Abdominal:     General: There is no distension.     Palpations: Abdomen is soft.  Musculoskeletal: Normal range of motion.  Skin:    General: Skin is warm and dry.  Neurological:     Mental Status: He is alert.      UC Treatments / Results  Labs (all labs ordered are listed, but only abnormal results are displayed) Labs Reviewed - No data to display  EKG   Radiology No results found.  Procedures Procedures (including critical care time)  Medications Ordered in UC Medications - No data to display  Initial Impression / Assessment and Plan / UC Course  I have reviewed the triage vital signs and the nursing notes.  Pertinent labs & imaging results that were available during my care of the patient were reviewed by me and considered in my medical decision making (see chart for details).     Discussed serous otitis.  This is usually treated  with just the Flonase Zyrtec fluids and time.  I think you are doing okay.  Patient is worried about maybe needing an antibiotic because he is a heart transplant patient.  I did provide him with an antibiotic to fill and use if he feels like the pain is becoming worse, if he runs a fever, or if he has purulent sinus drainage.  Patient will follow-up with his PCP Final Clinical Impressions(s) / UC Diagnoses   Final diagnoses:  Otalgia of left ear     Discharge Instructions     Continue Flonase and allergy medicine Drink plenty of fluids I expect you will see improvement in a couple of days If you have increasing pain, or fever fill and take the antibiotic prescribed   ED Prescriptions    Medication Sig Dispense Auth. Provider   cefUROXime (CEFTIN) 250 MG tablet Take 1 tablet (250 mg total) by mouth 2 (two) times daily with a meal. 14 tablet Raylene Everts, MD     Controlled Substance Prescriptions [place] Controlled Substance Registry consulted? Not Applicable   Raylene Everts, MD 07/19/19 1327

## 2019-07-19 NOTE — ED Triage Notes (Signed)
Pt here with left ear pain x 2 days; pt is 3 years SP heart transplant

## 2019-07-20 ENCOUNTER — Other Ambulatory Visit: Payer: Self-pay | Admitting: Gastroenterology

## 2019-07-21 ENCOUNTER — Inpatient Hospital Stay (HOSPITAL_BASED_OUTPATIENT_CLINIC_OR_DEPARTMENT_OTHER): Payer: 59 | Admitting: Hematology

## 2019-07-21 DIAGNOSIS — D473 Essential (hemorrhagic) thrombocythemia: Secondary | ICD-10-CM | POA: Diagnosis not present

## 2019-07-21 DIAGNOSIS — D75839 Thrombocytosis, unspecified: Secondary | ICD-10-CM

## 2019-07-21 DIAGNOSIS — D500 Iron deficiency anemia secondary to blood loss (chronic): Secondary | ICD-10-CM | POA: Diagnosis not present

## 2019-07-22 ENCOUNTER — Telehealth: Payer: Self-pay | Admitting: Hematology

## 2019-07-22 NOTE — Telephone Encounter (Signed)
Scheduled appt per 8/20 los.  Spoke with patient and he is aware of his appt date and time.

## 2019-07-25 ENCOUNTER — Telehealth: Payer: Self-pay | Admitting: Hematology

## 2019-07-25 ENCOUNTER — Telehealth: Payer: Self-pay | Admitting: *Deleted

## 2019-07-25 ENCOUNTER — Other Ambulatory Visit: Payer: Self-pay | Admitting: Hematology

## 2019-07-25 NOTE — Telephone Encounter (Signed)
Contacted patient at Dr.Kale's request: Noftified by [facility] managed care staff that insurance will approve Venofer for iron infusion, not the Injectafer originally ordered by Dr. Irene Limbo. Patient will now need 2 additional appointments for iron infusion. Patient notified of information regarding insurance approval for iron.  He stated he would contact his insurance company. Schedule message sent. Advised patient they will contact him to make appointments.He verbalized understanding.

## 2019-07-25 NOTE — Telephone Encounter (Signed)
Scheduled appt per 8/24 sch message - pt to get an updated schedule next visit.

## 2019-07-27 ENCOUNTER — Inpatient Hospital Stay: Payer: 59

## 2019-07-27 ENCOUNTER — Other Ambulatory Visit: Payer: Self-pay

## 2019-07-27 DIAGNOSIS — D473 Essential (hemorrhagic) thrombocythemia: Secondary | ICD-10-CM

## 2019-07-27 DIAGNOSIS — D500 Iron deficiency anemia secondary to blood loss (chronic): Secondary | ICD-10-CM

## 2019-07-27 DIAGNOSIS — D75839 Thrombocytosis, unspecified: Secondary | ICD-10-CM

## 2019-07-27 LAB — VITAMIN B12: Vitamin B-12: 268 pg/mL (ref 180–914)

## 2019-07-28 ENCOUNTER — Inpatient Hospital Stay: Payer: 59

## 2019-07-28 ENCOUNTER — Other Ambulatory Visit: Payer: Self-pay

## 2019-07-28 VITALS — BP 120/78 | HR 78 | Temp 98.5°F | Resp 18

## 2019-07-28 DIAGNOSIS — D473 Essential (hemorrhagic) thrombocythemia: Secondary | ICD-10-CM | POA: Diagnosis not present

## 2019-07-28 DIAGNOSIS — D500 Iron deficiency anemia secondary to blood loss (chronic): Secondary | ICD-10-CM

## 2019-07-28 LAB — FOLATE RBC
Folate, Hemolysate: 328 ng/mL
Folate, RBC: 828 ng/mL (ref >498)
Hematocrit: 39.6 % (ref 37.5–51.0)

## 2019-07-28 LAB — VITAMIN D 25 HYDROXY (VIT D DEFICIENCY, FRACTURES): Vit D, 25-Hydroxy: 20.2 ng/mL — ABNORMAL LOW (ref 30.0–100.0)

## 2019-07-28 MED ORDER — SODIUM CHLORIDE 0.9 % IV SOLN
200.0000 mg | Freq: Once | INTRAVENOUS | Status: AC
  Administered 2019-07-28: 200 mg via INTRAVENOUS
  Filled 2019-07-28: qty 10

## 2019-07-28 MED ORDER — LORATADINE 10 MG PO TABS
10.0000 mg | ORAL_TABLET | Freq: Once | ORAL | Status: AC
  Administered 2019-07-28: 10 mg via ORAL

## 2019-07-28 MED ORDER — SODIUM CHLORIDE 0.9 % IV SOLN
Freq: Once | INTRAVENOUS | Status: AC
  Administered 2019-07-28: 09:00:00 via INTRAVENOUS
  Filled 2019-07-28: qty 250

## 2019-07-28 MED ORDER — ACETAMINOPHEN 325 MG PO TABS
ORAL_TABLET | ORAL | Status: AC
  Filled 2019-07-28: qty 2

## 2019-07-28 MED ORDER — ACETAMINOPHEN 325 MG PO TABS
650.0000 mg | ORAL_TABLET | Freq: Once | ORAL | Status: AC
  Administered 2019-07-28: 09:00:00 650 mg via ORAL

## 2019-07-28 MED ORDER — LORATADINE 10 MG PO TABS
ORAL_TABLET | ORAL | Status: AC
  Filled 2019-07-28: qty 1

## 2019-07-28 NOTE — Patient Instructions (Signed)

## 2019-08-02 ENCOUNTER — Other Ambulatory Visit (HOSPITAL_COMMUNITY)
Admission: RE | Admit: 2019-08-02 | Discharge: 2019-08-02 | Disposition: A | Discharge status: Home / Self Care | Payer: 59 | Source: Ambulatory Visit | Attending: Gastroenterology | Admitting: Gastroenterology

## 2019-08-02 DIAGNOSIS — Z01812 Encounter for preprocedural laboratory examination: Secondary | ICD-10-CM | POA: Diagnosis present

## 2019-08-02 DIAGNOSIS — Z20828 Contact with and (suspected) exposure to other viral communicable diseases: Secondary | ICD-10-CM | POA: Insufficient documentation

## 2019-08-02 LAB — SARS CORONAVIRUS 2 (TAT 6-24 HRS): SARS Coronavirus 2: NEGATIVE

## 2019-08-03 ENCOUNTER — Encounter (HOSPITAL_COMMUNITY): Payer: Self-pay | Admitting: *Deleted

## 2019-08-03 ENCOUNTER — Other Ambulatory Visit: Payer: Self-pay

## 2019-08-04 ENCOUNTER — Inpatient Hospital Stay: Payer: 59 | Attending: Hematology

## 2019-08-04 ENCOUNTER — Other Ambulatory Visit: Payer: Self-pay

## 2019-08-04 VITALS — BP 139/89 | HR 72 | Temp 97.8°F | Resp 16

## 2019-08-04 DIAGNOSIS — Z23 Encounter for immunization: Secondary | ICD-10-CM | POA: Insufficient documentation

## 2019-08-04 DIAGNOSIS — D500 Iron deficiency anemia secondary to blood loss (chronic): Secondary | ICD-10-CM | POA: Diagnosis not present

## 2019-08-04 MED ORDER — LORATADINE 10 MG PO TABS
ORAL_TABLET | ORAL | Status: AC
  Filled 2019-08-04: qty 1

## 2019-08-04 MED ORDER — SODIUM CHLORIDE 0.9 % IV SOLN
Freq: Once | INTRAVENOUS | Status: AC
  Administered 2019-08-04: 09:00:00 via INTRAVENOUS
  Filled 2019-08-04: qty 250

## 2019-08-04 MED ORDER — SODIUM CHLORIDE 0.9 % IV SOLN
200.0000 mg | Freq: Once | INTRAVENOUS | Status: AC
  Administered 2019-08-04: 10:00:00 200 mg via INTRAVENOUS
  Filled 2019-08-04: qty 10

## 2019-08-04 MED ORDER — ACETAMINOPHEN 325 MG PO TABS
ORAL_TABLET | ORAL | Status: AC
  Filled 2019-08-04: qty 2

## 2019-08-04 MED ORDER — ACETAMINOPHEN 325 MG PO TABS
ORAL_TABLET | ORAL | Status: AC
  Filled 2019-08-04: qty 1

## 2019-08-04 MED ORDER — LORATADINE 10 MG PO TABS
10.0000 mg | ORAL_TABLET | Freq: Once | ORAL | Status: AC
  Administered 2019-08-04: 09:00:00 10 mg via ORAL

## 2019-08-04 MED ORDER — ACETAMINOPHEN 325 MG PO TABS
650.0000 mg | ORAL_TABLET | Freq: Once | ORAL | Status: AC
  Administered 2019-08-04: 09:00:00 650 mg via ORAL

## 2019-08-04 NOTE — Patient Instructions (Signed)

## 2019-08-05 ENCOUNTER — Encounter (HOSPITAL_COMMUNITY)
Admission: RE | Disposition: A | Discharge status: Home / Self Care | Payer: Self-pay | Source: Home / Self Care | Attending: Gastroenterology

## 2019-08-05 ENCOUNTER — Ambulatory Visit (HOSPITAL_COMMUNITY)
Admission: RE | Admit: 2019-08-05 | Discharge: 2019-08-05 | Disposition: A | Discharge status: Home / Self Care | Payer: 59 | Attending: Gastroenterology | Admitting: Gastroenterology

## 2019-08-05 ENCOUNTER — Encounter (HOSPITAL_COMMUNITY): Payer: Self-pay | Admitting: *Deleted

## 2019-08-05 DIAGNOSIS — D124 Benign neoplasm of descending colon: Secondary | ICD-10-CM | POA: Diagnosis not present

## 2019-08-05 DIAGNOSIS — Z09 Encounter for follow-up examination after completed treatment for conditions other than malignant neoplasm: Secondary | ICD-10-CM | POA: Diagnosis not present

## 2019-08-05 DIAGNOSIS — Z941 Heart transplant status: Secondary | ICD-10-CM | POA: Diagnosis not present

## 2019-08-05 DIAGNOSIS — Z85828 Personal history of other malignant neoplasm of skin: Secondary | ICD-10-CM | POA: Diagnosis not present

## 2019-08-05 DIAGNOSIS — D122 Benign neoplasm of ascending colon: Secondary | ICD-10-CM | POA: Diagnosis not present

## 2019-08-05 DIAGNOSIS — N189 Chronic kidney disease, unspecified: Secondary | ICD-10-CM | POA: Insufficient documentation

## 2019-08-05 DIAGNOSIS — Z9221 Personal history of antineoplastic chemotherapy: Secondary | ICD-10-CM | POA: Diagnosis not present

## 2019-08-05 DIAGNOSIS — K621 Rectal polyp: Secondary | ICD-10-CM | POA: Diagnosis not present

## 2019-08-05 DIAGNOSIS — D123 Benign neoplasm of transverse colon: Secondary | ICD-10-CM | POA: Diagnosis not present

## 2019-08-05 DIAGNOSIS — Z923 Personal history of irradiation: Secondary | ICD-10-CM | POA: Diagnosis not present

## 2019-08-05 DIAGNOSIS — D120 Benign neoplasm of cecum: Secondary | ICD-10-CM | POA: Diagnosis not present

## 2019-08-05 DIAGNOSIS — Z8571 Personal history of Hodgkin lymphoma: Secondary | ICD-10-CM | POA: Diagnosis not present

## 2019-08-05 DIAGNOSIS — Z85038 Personal history of other malignant neoplasm of large intestine: Secondary | ICD-10-CM | POA: Diagnosis not present

## 2019-08-05 HISTORY — PX: COLONOSCOPY: SHX5424

## 2019-08-05 HISTORY — PX: POLYPECTOMY: SHX5525

## 2019-08-05 LAB — JAK2 (INCLUDING V617F AND EXON 12), MPL,& CALR-NEXT GEN SEQ

## 2019-08-05 SURGERY — COLONOSCOPY

## 2019-08-05 MED ORDER — SODIUM CHLORIDE 0.9 % IV SOLN: INTRAVENOUS | Status: DC

## 2019-08-05 NOTE — Discharge Instructions (Signed)
YOU HAD AN ENDOSCOPIC PROCEDURE TODAY: Refer to the procedure report and other information in the discharge instructions given to you for any specific questions about what was found during the examination. If this information does not answer your questions, please call [facility] at [phone] to clarify.   YOU SHOULD EXPECT: Some feelings of bloating in the abdomen. Passage of more gas than usual. Walking can help get rid of the air that was put into your GI tract during the procedure and reduce the bloating. If you had a lower endoscopy (such as a colonoscopy or flexible sigmoidoscopy) you may notice spotting of blood in your stool or on the toilet paper. Some abdominal soreness may be present for a day or two, also.  DIET: Your first meal following the procedure should be a light meal and then it is ok to progress to your normal diet. A half-sandwich or bowl of soup is an example of a good first meal. Heavy or fried foods are harder to digest and may make you feel nauseous or bloated. Drink plenty of fluids but you should avoid alcoholic beverages for 24 hours. If you had an esophageal dilation, please see attached information for diet.   ACTIVITY: Your care partner should take you home directly after the procedure. You should plan to take it easy, moving slowly for the rest of the day. You can resume normal activity the day after the procedure however YOU SHOULD NOT DRIVE, use power tools, machinery or perform tasks that involve climbing or major physical exertion for 24 hours (because of the sedation medicines used during the test).   SYMPTOMS TO REPORT IMMEDIATELY: A gastroenterologist can be reached at any hour. Please call [phone]  for any of the following symptoms:  Following lower endoscopy (colonoscopy, flexible sigmoidoscopy) Excessive amounts of blood in the stool  Significant tenderness, worsening of abdominal pains  Swelling of the abdomen that is new, acute  Fever of  100 or higher   FOLLOW UP:  If any biopsies were taken you will be contacted by phone or by letter within the next 1-3 weeks. Call [phone]  if you have not heard about the biopsies in 3 weeks.  Please also call with any specific questions about appointments or follow up tests.  of blood or coffee ground material  New, significant abdominal pain  New, significant chest pain or pain under the shoulder blades  Painful or persistently difficult swallowing  New shortness of breath  Black, tarry-looking or red, bloody stools

## 2019-08-05 NOTE — H&P (Signed)
Glenn Gonzalez HPI: The patient is here for his routine follow up colonoscopy.  He is s/p cardiac transplant, which hastened the development of a very early rectal cancer.  His FFS 6 months ago was negative for any evidence of malignancy.  Past Medical History:  Diagnosis Date  . A-fib (HCC)    IN PAST  . Bulging lumbar disc    states " i threw my back out on tuesday 6-16, i ahve a bulging disc, but i can lie on my back a side "   . Chronic fatigue   . Chronic kidney disease    CREATININE NOW 1.4   . Colon cancer ([place]) DX 2019   SURGERY DONE  . Complication of anesthesia    AFTER HEART TRANSPLANT TOOK 2 YEARS TO GET BACK TO SELF; patient does not want aneshesia for endoscopy procedures   . Depression    SITUATIONAL  . GERD (gastroesophageal reflux disease)   . History of chemotherapy    DAMAGED HEART MUSCLE WITH RADIATION ALSO  . History of kidney stones   . History of radiation therapy   . Hodgkin's lymphoma ([place]) 1980   IIIB. 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  . Hypothyroidism   . Pneumonia FROM CHF 2016   recurrent pneumonia sedf.  rad tx for lymphoma  . Shortness of breath dyspnea    with exertion  . Skin cancer    AREAS REMOVED FROM [facility], CHECK AND BACK AND HEAD SEPT 2019  . Status post heart transplantation ([place]) 2017    Past Surgical History:  Procedure Laterality Date  . BIOPSY  07/02/2018   Procedure: BIOPSY;  Surgeon: Carol Ada, MD;  [facility];  Service: Endoscopy;;  . BIOPSY  05/20/2019   Procedure: BIOPSY;  Surgeon: Carol Ada, MD;  [facility];  Service: Endoscopy;;  . CARDIAC CATHETERIZATION N/A 04/20/2015   Procedure: Right/Left Heart Cath and Coronary Angiography;  Surgeon: Jolaine Artist, MD;  Location: [place] [facility];  Service: Cardiovascular;  Laterality: N/A;  . CARDIAC CATHETERIZATION  10-11-15   pt [facility]   . CARDIAC CATHETERIZATION N/A 04/02/2016   Procedure:  Right Heart Cath;  Surgeon: Jolaine Artist, MD;  Location: [place] [facility] LAB;  Service: Cardiovascular;  Laterality: N/A;  . CARDIAC CATHETERIZATION  JULY 2019 AT [facility]  . COLONOSCOPY N/A 07/02/2018   Procedure: COLONOSCOPY;  Surgeon: Carol Ada, MD;  [facility];  Service: Endoscopy;  Laterality: N/A;  . COLONOSCOPY WITH PROPOFOL N/A 08/03/2015   Procedure: COLONOSCOPY WITH PROPOFOL;  Surgeon: Carol Ada, MD;  [facility];  Service: Endoscopy;  Laterality: N/A;  wants to try to do procedure without sedation  . CYSTOSCOPY WITH INSERTION OF UROLIFT N/A 10/18/2018   Procedure: CYSTOSCOPY WITH INSERTION OF UROLIFT;  Surgeon: Cleon Gustin, MD;  [facility];  Service: Urology;  Laterality: N/A;  30 MINS  . CYSTOSCOPY WITH RETROGRADE PYELOGRAM, URETEROSCOPY AND STENT PLACEMENT Right 10/12/2015   Procedure: CYSTOSCOPY WITH RETROGRADE PYELOGRAM, URETEROSCOPY AND STENT PLACEMENT;  Surgeon: Cleon Gustin, MD;  [facility];  Service: Urology;  Laterality: Right;  . ESOPHAGOGASTRODUODENOSCOPY    . EXPLORATORY LAPAROTOMY    . FLEXIBLE SIGMOIDOSCOPY N/A 05/20/2019   Procedure: FLEXIBLE SIGMOIDOSCOPY;  Surgeon: Carol Ada, MD;  [facility];  Service: Endoscopy;  Laterality: N/A;  . gynemastia Bilateral   . HEART TRANSPLANT  06/20/2016   at [facility]  .  HOT HEMOSTASIS N/A 07/02/2018   Procedure: HOT HEMOSTASIS (ARGON PLASMA COAGULATION/BICAP);  Surgeon: Carol Ada, MD;  [facility];  Service: Endoscopy;  Laterality: N/A;  . LUMBAR LAMINECTOMY/DECOMPRESSION MICRODISCECTOMY Right 01/04/2016   Procedure: Microdiscectomy Lumbar four Lumbar five right;  Surgeon: Eustace Moore, MD;  Location: [place] [facility];  Service: Neurosurgery;  Laterality: Right;  . neck biopses     x5  . 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 POLYPECTOMY  07/02/2018   Procedure: POLYPECTOMY;  Surgeon: Carol Ada,  MD;  [facility];  Service: Endoscopy;;  . PORTACATH PLACEMENT     06-29-15 inserted, now removed.  . SPLENECTOMY, TOTAL  1981  . STONE EXTRACTION WITH BASKET Right 10/12/2015   Procedure: STONE EXTRACTION WITH BASKET;  Surgeon: Cleon Gustin, MD;  [facility];  Service: Urology;  Laterality: Right;  . SUBMUCOSAL INJECTION  07/02/2018   Procedure: SUBMUCOSAL INJECTION;  Surgeon: Carol Ada, MD;  [facility];  Service: Endoscopy;;  . TONSILLECTOMY    . VARICELE REPAIR  40 YRS AGO    Family History  Problem Relation Age of Onset  . Hyperthyroidism Mother   . Insomnia Mother   . Hypertension Father   . Depression Father   . Insomnia Father     Social History:  reports that he has never smoked. He has never used smokeless tobacco. He reports that he does not drink alcohol or use drugs.  Allergies:  Allergies  Allergen Reactions  . Digoxin Other (See Comments)    gynecomastia   . Phencyclidine Other (See Comments)    PCP derived antibiotic > Insomnia  . Spironolactone Other (See Comments)    Gynecomastia  . Lactose Intolerance (Gi) Diarrhea and Other (See Comments)    cramps  . Penicillins Other (See Comments)    Cramps Did it involve swelling of the face/tongue/throat, SOB, or low BP? No Did it involve sudden or severe rash/hives, skin peeling, or any reaction on the inside of your mouth or nose? No Did you need to seek medical attention at a hospital or doctor's office? No When did it last happen?unknown If all above answers are "NO", may proceed with cephalosporin use.     . Sulfur Rash    Medications:  Scheduled:  Continuous: . sodium chloride      No results found for this or any previous visit (from the past 24 hour(s)).   No results found.  ROS:  As stated above in the HPI otherwise negative.  Blood pressure 110/71, pulse 85, temperature 97.7 F (36.5 C), temperature source Oral, resp. rate 20, height 6\' 2"  (1.88  m), weight 102.1 kg, SpO2 99 %.    PE: Gen: NAD, Alert and Oriented HEENT:  [place]/AT, EOMI Neck: Supple, no LAD Lungs: CTA Bilaterally CV: RRR without M/G/R ABM: Soft, NTND, +BS Ext: No C/C/E  Assessment/Plan: 1) Personal history of colon cancer - colonoscopy.  Mariadel Mruk D 08/05/2019, 7:42 AM

## 2019-08-05 NOTE — Op Note (Signed)
[facility] Patient Name: Glenn Gonzalez Procedure Date: 08/05/2019 MRN: [id number] Attending MD: Carol Ada , MD Date of Birth: 05/29/1961 CSN: [id number] Age: 58 Admit Type: Outpatient Procedure:                Colonoscopy Indications:              High risk colon cancer surveillance: Personal                            history of colon cancer Providers:                Carol Ada, MD, Raynelle Bring, RN, Elspeth Cho                            Tech., Technician Referring MD:              Medicines:                None Complications:            No immediate complications. Estimated Blood Loss:     Estimated blood loss was minimal. Procedure:                Pre-Anesthesia Assessment:                           - Prior to the procedure, a History and Physical                            was performed, and patient medications and                            allergies were reviewed. The patient's tolerance of                            previous anesthesia was also reviewed. The risks                            and benefits of the procedure and the sedation                            options and risks were discussed with the patient.                            All questions were answered, and informed consent                            was obtained. Prior Anticoagulants: The patient has                            taken no previous anticoagulant or antiplatelet                            agents. ASA Grade Assessment: III - A patient with                            severe systemic  disease. After reviewing the risks                            and benefits, the patient was deemed in                            satisfactory condition to undergo the procedure.                           - No sedation medications were administered.                           After obtaining informed consent, the colonoscope                            was passed under direct vision. Throughout the                             procedure, the patient's blood pressure, pulse, and                            oxygen saturations were monitored continuously. The                            [id number]-[id number] ([id number] Olympus pediatric colonscope                            was introduced through the anus and advanced to the                            the cecum, identified by appendiceal orifice and                            ileocecal valve. The colonoscopy was performed                            without difficulty. The patient tolerated the                            procedure well. The quality of the bowel                            preparation was good. The ileocecal valve,                            appendiceal orifice, and rectum were photographed. Scope In: 8:43:50 AM Scope Out: 9:26:01 AM Scope Withdrawal Time: 0 hours 22 minutes 43 seconds  Total Procedure Duration: 0 hours 42 minutes 11 seconds  Findings:      19 sessile polyps were found in the rectum, descending colon, transverse       colon and cecum. The polyps were 2 to 5 mm in size. These polyps were       removed with a cold snare. Resection and retrieval were complete. Impression:               -  19 2 to 5 mm polyps in the rectum, in the                            descending colon, in the transverse colon and in                            the cecum, removed with a cold snare. Resected and                            retrieved. Moderate Sedation:      None Recommendation:           - Patient has a contact number available for                            emergencies. The signs and symptoms of potential                            delayed complications were discussed with the                            patient. Return to normal activities tomorrow.                            Written discharge instructions were provided to the                            patient.                           - Resume previous diet.                            - Continue present medications.                           - Await pathology results.                           - Repeat colonoscopy in 1 year for surveillance.                           - FFS in 6 months. Procedure Code(s):        --- Professional ---                           [phone], Colonoscopy, flexible; with removal of                            tumor(s), polyp(s), or other lesion(s) by snare                            technique Diagnosis Code(s):        --- Professional ---                           K62.1,  Rectal polyp                           Z85.038, Personal history of other malignant                            neoplasm of large intestine                           K63.5, Polyp of colon CPT copyright 2019 American Medical Association. All rights reserved. The codes documented in this report are preliminary and upon coder review may  be revised to meet current compliance requirements. Carol Ada, MD Carol Ada, MD 08/05/2019 9:31:23 AM This report has been signed electronically. Number of Addenda: 0

## 2019-08-09 ENCOUNTER — Encounter (HOSPITAL_COMMUNITY): Payer: Self-pay | Admitting: Gastroenterology

## 2019-08-12 ENCOUNTER — Inpatient Hospital Stay: Payer: 59

## 2019-08-12 ENCOUNTER — Other Ambulatory Visit: Payer: Self-pay

## 2019-08-12 VITALS — BP 127/75 | HR 77 | Temp 98.3°F | Resp 16

## 2019-08-12 DIAGNOSIS — D500 Iron deficiency anemia secondary to blood loss (chronic): Secondary | ICD-10-CM | POA: Diagnosis not present

## 2019-08-12 DIAGNOSIS — Z23 Encounter for immunization: Secondary | ICD-10-CM

## 2019-08-12 MED ORDER — ACETAMINOPHEN 325 MG PO TABS
ORAL_TABLET | ORAL | Status: AC
  Filled 2019-08-12: qty 2

## 2019-08-12 MED ORDER — SODIUM CHLORIDE 0.9 % IV SOLN
Freq: Once | INTRAVENOUS | Status: AC
  Administered 2019-08-12: 09:00:00 via INTRAVENOUS
  Filled 2019-08-12: qty 250

## 2019-08-12 MED ORDER — ACETAMINOPHEN 325 MG PO TABS
650.0000 mg | ORAL_TABLET | Freq: Once | ORAL | Status: AC
  Administered 2019-08-12: 09:00:00 650 mg via ORAL

## 2019-08-12 MED ORDER — LORATADINE 10 MG PO TABS
ORAL_TABLET | ORAL | Status: AC
  Filled 2019-08-12: qty 1

## 2019-08-12 MED ORDER — LORATADINE 10 MG PO TABS
10.0000 mg | ORAL_TABLET | Freq: Once | ORAL | Status: AC
  Administered 2019-08-12: 10 mg via ORAL

## 2019-08-12 MED ORDER — INFLUENZA VAC SPLIT QUAD 0.5 ML IM SUSY
0.5000 mL | PREFILLED_SYRINGE | Freq: Once | INTRAMUSCULAR | Status: AC
  Administered 2019-08-12: 0.5 mL via INTRAMUSCULAR
  Filled 2019-08-12: qty 0.5

## 2019-08-12 MED ORDER — SODIUM CHLORIDE 0.9 % IV SOLN
200.0000 mg | Freq: Once | INTRAVENOUS | Status: AC
  Administered 2019-08-12: 200 mg via INTRAVENOUS
  Filled 2019-08-12: qty 10

## 2019-08-12 NOTE — Patient Instructions (Addendum)
Iron Sucrose injection What is this medicine? IRON SUCROSE (AHY ern SOO krohs) is an iron complex. Iron is used to make healthy red blood cells, which carry oxygen and nutrients throughout the body. This medicine is used to treat iron deficiency anemia in people with chronic kidney disease. This medicine may be used for other purposes; ask your health care provider or pharmacist if you have questions. COMMON BRAND NAME(S): Venofer What should I tell my health care provider before I take this medicine? They need to know if you have any of these conditions:  anemia not caused by low iron levels  heart disease  high levels of iron in the blood  kidney disease  liver disease  an unusual or allergic reaction to iron, other medicines, foods, dyes, or preservatives  pregnant or trying to get pregnant  breast-feeding How should I use this medicine? This medicine is for infusion into a vein. It is given by a health care professional in a hospital or clinic setting. Talk to your pediatrician regarding the use of this medicine in children. While this drug may be prescribed for children as young as 2 years for selected conditions, precautions do apply. Overdosage: If you think you have taken too much of this medicine contact a poison control center or emergency room at once. NOTE: This medicine is only for you. Do not share this medicine with others. What if I miss a dose? It is important not to miss your dose. Call your doctor or health care professional if you are unable to keep an appointment. What may interact with this medicine? Do not take this medicine with any of the following medications:  deferoxamine  dimercaprol  other iron products This medicine may also interact with the following medications:  chloramphenicol  deferasirox This list may not describe all possible interactions. Give your health care provider a list of all the medicines, herbs, non-prescription drugs, or  dietary supplements you use. Also tell them if you smoke, drink alcohol, or use illegal drugs. Some items may interact with your medicine. What should I watch for while using this medicine? Visit your doctor or healthcare professional regularly. Tell your doctor or healthcare professional if your symptoms do not start to get better or if they get worse. You may need blood work done while you are taking this medicine. You may need to follow a special diet. Talk to your doctor. Foods that contain iron include: whole grains/cereals, dried fruits, beans, or peas, leafy green vegetables, and organ meats (liver, kidney). What side effects may I notice from receiving this medicine? Side effects that you should report to your doctor or health care professional as soon as possible:  allergic reactions like skin rash, itching or hives, swelling of the face, lips, or tongue  breathing problems  changes in blood pressure  cough  fast, irregular heartbeat  feeling faint or lightheaded, falls  fever or chills  flushing, sweating, or hot feelings  joint or muscle aches/pains  seizures  swelling of the ankles or feet  unusually weak or tired Side effects that usually do not require medical attention (report to your doctor or health care professional if they continue or are bothersome):  diarrhea  feeling achy  headache  irritation at site where injected  nausea, vomiting  stomach upset  tiredness This list may not describe all possible side effects. Call your doctor for medical advice about side effects. You may report side effects to FDA at 1-800-FDA-1088. Where should I keep   my medicine? This drug is given in a hospital or clinic and will not be stored at home. NOTE: This sheet is a summary. It may not cover all possible information. If you have questions about this medicine, talk to your doctor, pharmacist, or health care provider.  2020 Elsevier/Gold Standard (2011-08-28  17:14:35)  Influenza (Flu) Vaccine (Inactivated or Recombinant): What You Need to Know 1. Why get vaccinated? Influenza vaccine can prevent influenza (flu). Flu is a contagious disease that spreads around the [place] every year, usually between October and May. Anyone can get the flu, but it is more dangerous for some people. Infants and young children, people 56 years of age and older, pregnant women, and people with certain health conditions or a weakened immune system are at greatest risk of flu complications. Pneumonia, bronchitis, sinus infections and ear infections are examples of flu-related complications. If you have a medical condition, such as heart disease, cancer or diabetes, flu can make it worse. Flu can cause fever and chills, sore throat, muscle aches, fatigue, cough, headache, and runny or stuffy nose. Some people may have vomiting and diarrhea, though this is more common in children than adults. Each year thousands of people in the [place] die from flu, and many more are hospitalized. Flu vaccine prevents millions of illnesses and flu-related visits to the doctor each year. 2. Influenza vaccine CDC recommends everyone 79 months of age and older get vaccinated every flu season. Children 6 months through 59 years of age may need 2 doses during a single flu season. Everyone else needs only 1 dose each flu season. It takes about 2 weeks for protection to develop after vaccination. There are many flu viruses, and they are always changing. Each year a new flu vaccine is made to protect against three or four viruses that are likely to cause disease in the upcoming flu season. Even when the vaccine doesn't exactly match these viruses, it may still provide some protection. Influenza vaccine does not cause flu. Influenza vaccine may be given at the same time as other vaccines. 3. Talk with your health care provider Tell your vaccine provider if the person getting the vaccine:  Has  had an allergic reaction after a previous dose of influenza vaccine, or has any severe, life-threatening allergies.  Has ever had Guillain-Barr Syndrome (also called GBS). In some cases, your health care provider may decide to postpone influenza vaccination to a future visit. People with minor illnesses, such as a cold, may be vaccinated. People who are moderately or severely ill should usually wait until they recover before getting influenza vaccine. Your health care provider can give you more information. 4. Risks of a vaccine reaction  Soreness, redness, and swelling where shot is given, fever, muscle aches, and headache can happen after influenza vaccine.  There may be a very small increased risk of Guillain-Barr Syndrome (GBS) after inactivated influenza vaccine (the flu shot). Young children who get the flu shot along with pneumococcal vaccine (PCV13), and/or DTaP vaccine at the same time might be slightly more likely to have a seizure caused by fever. Tell your health care provider if a child who is getting flu vaccine has ever had a seizure. People sometimes faint after medical procedures, including vaccination. Tell your provider if you feel dizzy or have vision changes or ringing in the ears. As with any medicine, there is a very remote chance of a vaccine causing a severe allergic reaction, other serious injury, or death. 5. What if  there is a serious problem? An allergic reaction could occur after the vaccinated person leaves the clinic. If you see signs of a severe allergic reaction (hives, swelling of the face and throat, difficulty breathing, a fast heartbeat, dizziness, or weakness), call 9-1-1 and get the person to the nearest hospital. For other signs that concern you, call your health care provider. Adverse reactions should be reported to the Vaccine Adverse Event Reporting System (VAERS). Your health care provider will usually file this report, or you can do it yourself. Visit  the VAERS website at www.vaers.SamedayNews.es or call (404) 112-1509.VAERS is only for reporting reactions, and VAERS staff do not give medical advice. 6. The National Vaccine Injury Compensation Program The [employer] Vaccine Injury Compensation Program (VICP) is a federal program that was created to compensate people who may have been injured by certain vaccines. Visit the VICP website at GoldCloset.com.ee or call 503-476-9871 to learn about the program and about filing a claim. There is a time limit to file a claim for compensation. 7. How can I learn more?  Ask your healthcare provider.  Call your local or state health department.  Contact the Centers for Disease Control and Prevention (CDC): ? Call (559)293-1787 (1-800-CDC-INFO) or ? Visit CDC's https://gibson.com/ Vaccine Information Statement (Interim) Inactivated Influenza Vaccine (07/15/2018) This information is not intended to replace advice given to you by your health care provider. Make sure you discuss any questions you have with your health care provider. Document Released: 09/11/2006 Document Revised: 03/08/2019 Document Reviewed: 07/19/2018 Elsevier Patient Education  2020 [place] (COVID-19) Are you at risk?  Are you at risk for the Coronavirus (COVID-19)?  To be considered HIGH RISK for Coronavirus (COVID-19), you have to meet the following criteria:  . Traveled to [place], [place], [place], [place] or [place]; or in the [place] to [place], [place], [place], or [place]; and have fever, cough, and shortness of breath within the last 2 weeks of travel OR . Been in close contact with a person diagnosed with COVID-19 within the last 2 weeks and have fever, cough, and shortness of breath . IF YOU DO NOT MEET THESE CRITERIA, YOU ARE CONSIDERED LOW RISK FOR COVID-19.  What to do if you are HIGH RISK for COVID-19?  Marland Kitchen If you are having a medical emergency, call 911. . Seek medical care right  away. Before you go to a doctor's office, urgent care or emergency department, call ahead and tell them about your recent travel, contact with someone diagnosed with COVID-19, and your symptoms. You should receive instructions from your physician's office regarding next steps of care.  . When you arrive at healthcare provider, tell the healthcare staff immediately you have returned from visiting [place], [place], [place], [place] or [place]; or traveled in the [place] to [place], [place], [place], or [place]; in the last two weeks or you have been in close contact with a person diagnosed with COVID-19 in the last 2 weeks.   . Tell the health care staff about your symptoms: fever, cough and shortness of breath. . After you have been seen by a medical provider, you will be either: o Tested for (COVID-19) and discharged home on quarantine except to seek medical care if symptoms worsen, and asked to  - Stay home and avoid contact with others until you get your results (4-5 days)  - Avoid travel on public transportation if possible (such as bus, train, or airplane) or o Sent to the  Emergency Department by EMS for evaluation, COVID-19 testing, and possible admission depending on your condition and test results.  What to do if you are LOW RISK for COVID-19?  Reduce your risk of any infection by using the same precautions used for avoiding the common cold or flu:  Marland Kitchen Wash your hands often with soap and warm water for at least 20 seconds.  If soap and water are not readily available, use an alcohol-based hand sanitizer with at least 60% alcohol.  . If coughing or sneezing, cover your mouth and nose by coughing or sneezing into the elbow areas of your shirt or coat, into a tissue or into your sleeve (not your hands). . Avoid shaking hands with others and consider head nods or verbal greetings only. . Avoid touching your eyes, nose, or mouth with unwashed hands.  . Avoid close contact with people who  are sick. . Avoid places or events with large numbers of people in one location, like concerts or sporting events. . Carefully consider travel plans you have or are making. . If you are planning any travel outside or inside the [place], visit the CDC's Travelers' Health webpage for the latest health notices. . If you have some symptoms but not all symptoms, continue to monitor at home and seek medical attention if your symptoms worsen. . If you are having a medical emergency, call 911.   [place] / e-Visit: [email or web address]         [facility]: [facility]: [phone]                   [facility]: [phone]

## 2019-08-19 ENCOUNTER — Inpatient Hospital Stay: Payer: 59

## 2019-08-19 ENCOUNTER — Other Ambulatory Visit: Payer: Self-pay

## 2019-08-19 VITALS — BP 129/77 | HR 77 | Temp 97.0°F | Resp 16

## 2019-08-19 DIAGNOSIS — D500 Iron deficiency anemia secondary to blood loss (chronic): Secondary | ICD-10-CM | POA: Diagnosis not present

## 2019-08-19 MED ORDER — LORATADINE 10 MG PO TABS
ORAL_TABLET | ORAL | Status: AC
  Filled 2019-08-19: qty 1

## 2019-08-19 MED ORDER — SODIUM CHLORIDE 0.9 % IV SOLN
Freq: Once | INTRAVENOUS | Status: AC
  Administered 2019-08-19: 09:00:00 via INTRAVENOUS
  Filled 2019-08-19: qty 250

## 2019-08-19 MED ORDER — ACETAMINOPHEN 325 MG PO TABS
650.0000 mg | ORAL_TABLET | Freq: Once | ORAL | Status: AC
  Administered 2019-08-19: 650 mg via ORAL

## 2019-08-19 MED ORDER — SODIUM CHLORIDE 0.9 % IV SOLN
200.0000 mg | Freq: Once | INTRAVENOUS | Status: AC
  Administered 2019-08-19: 09:00:00 200 mg via INTRAVENOUS
  Filled 2019-08-19: qty 10

## 2019-08-19 MED ORDER — LORATADINE 10 MG PO TABS
10.0000 mg | ORAL_TABLET | Freq: Once | ORAL | Status: AC
  Administered 2019-08-19: 10 mg via ORAL

## 2019-08-19 MED ORDER — ACETAMINOPHEN 325 MG PO TABS
ORAL_TABLET | ORAL | Status: AC
  Filled 2019-08-19: qty 2

## 2019-09-08 ENCOUNTER — Telehealth: Payer: Self-pay | Admitting: *Deleted

## 2019-09-08 NOTE — Telephone Encounter (Signed)
Patient called, last seen 8/20 - was to RTC in 2 months. (No f/u appt was ever scheduled) Feeling better after he recieved IV iron - no further Shortness of breath or feeling faint, but still fatigued. He is agreeable to come next week. Sent schedule message to get him in next week for 2 month f/u. Advised to go to ED for increased SOB/CP or extreme dizziness. He verbalized understanding. Schedule message sent

## 2019-09-12 ENCOUNTER — Telehealth: Payer: Self-pay | Admitting: Hematology

## 2019-09-12 NOTE — Progress Notes (Signed)
HEMATOLOGY/ONCOLOGY CLINIC NOTE  Date of Service: 09/13/2019  Patient Care Team: Lajean Manes, MD as PCP - General (Internal Medicine)  CHIEF COMPLAINTS/PURPOSE OF CONSULTATION:  Thrombocytosis Iron deficiency Leucocytosis   HISTORY OF PRESENTING ILLNESS:   Glenn Gonzalez is a wonderful 58 y.o. male who has been referred to [place] by Dr. Felipa Eth for evaluation and management of thrombocytosis. The pt reports that he is doing well overall.  The pt reports a hx of Hodgkin's lymphoma in the 1980s. He was treated at [facility] with maximum cycles of MOP and ABVD, as well as spade radiation. Denies bone marrow involvement.    He also has a hx of melanoma. Most of the spots were on his chest around the site of his radiation. He has had over 20 spots removed, the most recent on his scalp. He manages this with [facility].  He was diagnosed with colon cancer last year and has had several polyps removed. He has been getting sigmoidoscopies every 3 months and colonoscopies yearly.  He notes a recent prostate infection, which is being followed by a urologist. His most recent PSA was 2.66.  He is currently taking 2.5 mg prednisone and ASA. He is also taking Doxycycline 100 mg BID after the removal of the spot on his scalp.  Most recent lab results (06/14/2019) of CBC is as follows: all values are WNL except for WBC at 11.3k, HGB at 11.8, HCT at 38.7, MCH at 25.2, MCHC at 30.5, RDW at 18.2, PLT at 513  On review of systems, pt reports bladder pain, body soreness and denies mouth sores, belly pain, mouth soreness, and any other symptoms.   On PMHx the pt reports hx of Hodgkins lymphoma, heart transplant, melanoma, colon cancer, multiple catheter-related blood clots after surgery On FHx the pt reports that his brother had colon cancer with polyps  Interval History  Glenn Gonzalez is a 58 y.o. male being called today for the management and evaluation of thrombocytosis and  leukocytosis. The patient's last visit with [place] was on 07/21/2019. The pt reports that he is doing well overall.  The pt reports that he is no longer SOB or dizzy but is still fatigued. He states that he only has about 15 minutes of energy at a time and must stop whatever activity that he is doing after that. Where his SOB used to stop his activity, energy is now the limiting factor. Pt is on chronic Prednisone. Pt has been having a lot of acid reflux this past week. He has also noticed more mucous in his stools as well as intermittent diarrhea. Pt has not had any recent dietary changes and is not taking PO iron at this time. He was on antibiotics for a month due to back to back infections. He was taking Doxycycline for 3 weeks and another antibiotic for at least a week.   Lab results today (09/13/19) of CBC w/diff and CMP is as follows: all values are WNL except for WBC at 11.1K, RDW at 21.0 PLTs at 410K, Neutro Abs at 8.8K, Mono Abs at 1.1K, Glucose at 104, BUN at 24, Creatinine at 1.26, AST at 14. 09/13/2019 Ferritin at 110 09/13/2019 Vitamin B12 at 250 09/13/2019 Iron and TIBC is as follows: Iron at 62, TIBC at 311, Sat Ratios at 20, UIBC at 250  On review of systems, pt reports fatigue, mucous in stools, intermittent diarrhea and denies SOB, dizziness, leg swelling and any other symptoms.   MEDICAL HISTORY:  Past Medical History:  Diagnosis Date  . A-fib (HCC)    IN PAST  . Bulging lumbar disc    states " i threw my back out on tuesday 6-16, i ahve a bulging disc, but i can lie on my back a side "   . Chronic fatigue   . Chronic kidney disease    CREATININE NOW 1.4   . Colon cancer ([place]) DX 2019   SURGERY DONE  . Complication of anesthesia    AFTER HEART TRANSPLANT TOOK 2 YEARS TO GET BACK TO SELF; patient does not want aneshesia for endoscopy procedures   . Depression    SITUATIONAL  . GERD (gastroesophageal reflux disease)   . History of chemotherapy    DAMAGED HEART MUSCLE WITH  RADIATION ALSO  . History of kidney stones   . History of radiation therapy   . Hodgkin's lymphoma ([place]) 1980   IIIB. 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  . Hypothyroidism   . Pneumonia FROM CHF 2016   recurrent pneumonia sedf.  rad tx for lymphoma  . Shortness of breath dyspnea    with exertion  . Skin cancer    AREAS REMOVED FROM [facility], CHECK AND BACK AND HEAD SEPT 2019  . Status post heart transplantation ([place]) 2017    SURGICAL HISTORY: Past Surgical History:  Procedure Laterality Date  . BIOPSY  07/02/2018   Procedure: BIOPSY;  Surgeon: Carol Ada, MD;  Location: [facility];  Service: Endoscopy;;  . BIOPSY  05/20/2019   Procedure: BIOPSY;  Surgeon: Carol Ada, MD;  Location: [facility];  Service: Endoscopy;;  . CARDIAC CATHETERIZATION N/A 04/20/2015   Procedure: Right/Left Heart Cath and Coronary Angiography;  Surgeon: Jolaine Artist, MD;  Location: [facility];  Service: Cardiovascular;  Laterality: N/A;  . CARDIAC CATHETERIZATION  10-11-15   pt states routine heart cath to [facility]   . CARDIAC CATHETERIZATION N/A 04/02/2016   Procedure: Right Heart Cath;  Surgeon: Jolaine Artist, MD;  Location: [place] [facility];  Service: Cardiovascular;  Laterality: N/A;  . CARDIAC CATHETERIZATION  JULY 2019 AT [facility]  . COLONOSCOPY N/A 07/02/2018   Procedure: COLONOSCOPY;  Surgeon: Carol Ada, MD;  [facility];  Service: Endoscopy;  Laterality: N/A;  . COLONOSCOPY N/A 08/05/2019   Procedure: COLONOSCOPY;  Surgeon: Carol Ada, MD;  [facility]: [facility];  Service: Endoscopy;  Laterality: N/A;  . COLONOSCOPY WITH PROPOFOL N/A 08/03/2015   Procedure: COLONOSCOPY WITH PROPOFOL;  Surgeon: Carol Ada, MD;  [facility]: [facility];  Service: Endoscopy;  Laterality: N/A;  wants to try to do procedure without sedation  . CYSTOSCOPY WITH INSERTION OF UROLIFT N/A 10/18/2018   Procedure: CYSTOSCOPY WITH INSERTION OF UROLIFT;  Surgeon: Cleon Gustin, MD;  Location: [facility];  Service: Urology;  Laterality: N/A;  30 MINS  . CYSTOSCOPY WITH RETROGRADE PYELOGRAM, URETEROSCOPY AND STENT PLACEMENT Right 10/12/2015   Procedure: CYSTOSCOPY WITH RETROGRADE PYELOGRAM, URETEROSCOPY AND STENT PLACEMENT;  Surgeon: Cleon Gustin, MD;  Location: [facility];  Service: Urology;  Laterality: Right;  . ESOPHAGOGASTRODUODENOSCOPY    . EXPLORATORY LAPAROTOMY    . FLEXIBLE SIGMOIDOSCOPY N/A 05/20/2019   Procedure: FLEXIBLE SIGMOIDOSCOPY;  Surgeon: Carol Ada, MD;  [facility];  Service: Endoscopy;  Laterality: N/A;  . gynemastia Bilateral   . HEART TRANSPLANT  06/20/2016   at [facility]  . HOT HEMOSTASIS N/A 07/02/2018   Procedure: HOT HEMOSTASIS (ARGON PLASMA COAGULATION/BICAP);  Surgeon: Benson Norway,  Saralyn Pilar, MD;  Location: [facility];  Service: Endoscopy;  Laterality: N/A;  . LUMBAR LAMINECTOMY/DECOMPRESSION MICRODISCECTOMY Right 01/04/2016   Procedure: Microdiscectomy Lumbar four Lumbar five right;  Surgeon: Eustace Moore, MD;  Location: [facility];  Service: Neurosurgery;  Laterality: Right;  . neck biopses     x5  . 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 POLYPECTOMY  07/02/2018   Procedure: POLYPECTOMY;  Surgeon: Carol Ada, MD;  [facility];  Service: Endoscopy;;  . POLYPECTOMY  08/05/2019   Procedure: POLYPECTOMY;  Surgeon: Carol Ada, MD;  Location: [facility];  Service: Endoscopy;;  . PORTACATH PLACEMENT     06-29-15 inserted, now removed.  . SPLENECTOMY, TOTAL  1981  . STONE EXTRACTION WITH BASKET Right 10/12/2015   Procedure: STONE EXTRACTION WITH BASKET;  Surgeon: Cleon Gustin, MD;  [facility];  Service: Urology;  Laterality: Right;  . SUBMUCOSAL INJECTION  07/02/2018   Procedure: SUBMUCOSAL INJECTION;  Surgeon: Carol Ada, MD;  [facility];  Service: Endoscopy;;  . TONSILLECTOMY    . VARICELE REPAIR  40 YRS AGO    SOCIAL  HISTORY: Social History   Socioeconomic History  . Marital status: Married    Spouse name: Not on file  . Number of children: Not on file  . Years of education: Not on file  . Highest education level: Not on file  Occupational History  . Occupation: disabled  Social Needs  . Financial resource strain: Not on file  . Food insecurity    Worry: Not on file    Inability: Not on file  . Transportation needs    Medical: Not on file    Non-medical: Not on file  Tobacco Use  . Smoking status: Never Smoker  . Smokeless tobacco: Never Used  Substance and Sexual Activity  . Alcohol use: No  . Drug use: No  . Sexual activity: Not on file  Lifestyle  . Physical activity    Days per week: Not on file    Minutes per session: Not on file  . Stress: Not on file  Relationships  . Social Herbalist on phone: Not on file    Gets together: Not on file    Attends religious service: Not on file    Active member of club or organization: Not on file    Attends meetings of clubs or organizations: Not on file    Relationship status: Not on file  . Intimate partner violence    Fear of current or ex partner: Not on file    Emotionally abused: Not on file    Physically abused: Not on file    Forced sexual activity: Not on file  Other Topics Concern  . Not on file  Social History Narrative  . Not on file    FAMILY HISTORY: Family History  Problem Relation Age of Onset  . Hyperthyroidism Mother   . Insomnia Mother   . Hypertension Father   . Depression Father   . Insomnia Father     ALLERGIES:  is allergic to digoxin; phencyclidine; spironolactone; lactose intolerance (gi); penicillins; and sulfur.  MEDICATIONS:  Current Outpatient Medications  Medication Sig Dispense Refill  . acetaminophen (TYLENOL) 650 MG CR tablet Take 1,300 mg by mouth at bedtime as needed for pain.     Marland Kitchen allopurinol (ZYLOPRIM) 100 MG tablet Take 200 mg by mouth daily.   10  . aspirin EC 81  MG tablet  Take 81 mg by mouth daily.     . Calcium Carbonate-Vitamin D3 (RA CALCIUM 600/VITAMIN D-3) 600-400 MG-UNIT TABS Take 1 tablet by mouth 2 (two) times a day.     . cetirizine (ZYRTEC) 10 MG tablet Take 10 mg by mouth at bedtime.     Marland Kitchen ENVARSUS XR 1 MG TB24 Take 2 mg by mouth daily.     Marland Kitchen FLUoxetine (PROZAC) 20 MG tablet Take 20 mg by mouth daily.    . fluticasone (FLONASE) 50 MCG/ACT nasal spray Place 1 spray into both nostrils daily as needed for allergies or rhinitis.     Marland Kitchen levothyroxine (SYNTHROID, LEVOTHROID) 125 MCG tablet Take 125 mcg by mouth daily.     . Mycophenolate Sodium (MYCOPHENOLIC ACID PO) Take 161 mg by mouth 2 (two) times a day.     . NON FORMULARY Apply 1 application topically daily. [facility] Anti-fungal (Nail)  (3) Refills *added the urea 40% to the anti-fungal (nail) -#1    . predniSONE (DELTASONE) 2.5 MG tablet Take 1 tablet (2.5 mg total) by mouth daily. (Patient taking differently: Take 2.5 mg by mouth daily. ) 20 tablet 0  . RABEprazole (ACIPHEX) 20 MG tablet Take 1 tablet (20 mg total) by mouth 2 (two) times daily. (Patient taking differently: Take 20 mg by mouth 2 (two) times a day. ) 180 tablet 3  . valACYclovir (VALTREX) 500 MG tablet Take 500 mg by mouth 2 (two) times daily as needed (for outbreak).      No current facility-administered medications for this visit.     REVIEW OF SYSTEMS:    A 10+ POINT REVIEW OF SYSTEMS WAS OBTAINED including neurology, dermatology, psychiatry, cardiac, respiratory, lymph, extremities, GI, GU, Musculoskeletal, constitutional, breasts, reproductive, HEENT.  All pertinent positives are noted in the HPI.  All others are negative.   PHYSICAL EXAMINATION: ECOG PERFORMANCE STATUS: 0 - Asymptomatic  . Vitals:   09/13/19 0940  BP: 100/62  Pulse: 69  Resp: 17  Temp: 98 F (36.7 C)  SpO2: 100%   Filed Weights   09/13/19 0940  Weight: 228 lb (103.4 kg)   .Body mass index is 29.27 kg/m.  Exam was given in a chair    GENERAL:alert, in no acute distress and comfortable SKIN: no acute rashes, no significant lesions EYES: conjunctiva are pink and non-injected, sclera anicteric OROPHARYNX: MMM, no exudates, no oropharyngeal erythema or ulceration NECK: supple, no JVD LYMPH:  no palpable lymphadenopathy in the cervical, axillary or inguinal regions LUNGS: clear to auscultation b/l with normal respiratory effort HEART: regular rate & rhythm ABDOMEN:  normoactive bowel sounds , non tender, not distended. No palpable hepatosplenomegaly.  Extremity: no pedal edema PSYCH: alert & oriented x 3 with fluent speech NEURO: no focal motor/sensory deficits  LABORATORY DATA:  I have reviewed the data as listed  . CBC Latest Ref Rng & Units 09/13/2019 07/27/2019 07/07/2019  WBC 4.0 - 10.5 K/uL 11.1(H) - 11.6(H)  Hemoglobin 13.0 - 17.0 g/dL 14.0 - 12.1(L)  Hematocrit 39.0 - 52.0 % 45.1 39.6 40.9  Platelets 150 - 400 K/uL 410(H) - 516(H)    . CMP Latest Ref Rng & Units 09/13/2019 07/07/2019 10/18/2018  Glucose 70 - 99 mg/dL 104(H) 96 92  BUN 6 - 20 mg/dL 24(H) 24(H) 30(H)  Creatinine 0.61 - 1.24 mg/dL 1.26(H) 1.23 1.30(H)  Sodium 135 - 145 mmol/L 139 138 138  Potassium 3.5 - 5.1 mmol/L 3.8 3.9 4.1  Chloride 98 - 111 mmol/L 102  103 101  CO2 22 - 32 mmol/L 27 23 -  Calcium 8.9 - 10.3 mg/dL 9.4 9.2 -  Total Protein 6.5 - 8.1 g/dL 6.5 6.8 -  Total Bilirubin 0.3 - 1.2 mg/dL 0.5 0.4 -  Alkaline Phos 38 - 126 U/L 82 95 -  AST 15 - 41 U/L 14(L) 14(L) -  ALT 0 - 44 U/L 13 12 -   . Lab Results  Component Value Date   IRON 62 09/13/2019   TIBC 311 09/13/2019   IRONPCTSAT 20 09/13/2019   (Iron and TIBC)  Lab Results  Component Value Date   FERRITIN 110 09/13/2019    05/20/2019 Surgical Pathology:   RADIOGRAPHIC STUDIES: I have personally reviewed the radiological images as listed and agreed with the findings in the report. No results found.  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 -Discussed 09/13/2019 Ferritin at 110 -Discussed 09/13/2019 Vitamin B12 at 250-- recommend SL B1 1000 mcg daily -Discussed 09/13/2019 Iron and TIBC is as follows: Iron at 62, TIBC at 311, Sat Ratios at 20, UIBC at 250 -Advised that long-term antibiotics can disrupt the gut microbiome causing some of pt's symptoms -Recommended pt take infant probiotics  -Advised pt to contact GI or PCP to r/o C.diff if his GI symptoms persist .  -Ferritin goal = >100  -Discussed that managing his heart transplant is his priority at this time and that the possibility of Hodgkin's recurrence is unlikely -Recommend staying up to date with age appropriate cancer screening -Will see back in 6 months with labs   FOLLOW UP: RTC with Dr Irene Limbo with labs in 6 months  Orders Placed This Encounter  Procedures  . CBC with Differential/Platelet    Standing Status:   Future    Standing Expiration Date:   10/17/2020  . CMP ([place] only)    Standing Status:   Future    Standing Expiration Date:   09/12/2020  . Ferritin    Standing Status:   Future    Standing Expiration Date:   09/12/2020  . Iron and TIBC    Standing Status:   Future    Standing Expiration Date:   09/12/2020   The total time spent in the appt was 20 minutes and more than 50% was on counseling and direct patient cares.  All of the patient's questions were answered with apparent satisfaction. The patient knows to call the clinic with any problems, questions or concerns.Sullivan Lone MD [place] AAHIVMS [facility] Hematology/Oncology  Physician [facility]  (Office):       [phone] (Work cell):  [phone] (Fax):           [phone]  09/13/2019 1:28 PM  I, Yevette Edwards, am acting as a scribe for Dr. Sullivan Lone.   .I have reviewed the above documentation for accuracy and completeness, and I agree with the above. [name] MD

## 2019-09-12 NOTE — Telephone Encounter (Signed)
Called pt per 10/8 sch message - pt aware of appt date and time

## 2019-09-13 ENCOUNTER — Inpatient Hospital Stay: Payer: 59

## 2019-09-13 ENCOUNTER — Inpatient Hospital Stay: Payer: 59 | Attending: Hematology | Admitting: Hematology

## 2019-09-13 ENCOUNTER — Telehealth: Payer: Self-pay | Admitting: Hematology

## 2019-09-13 ENCOUNTER — Other Ambulatory Visit: Payer: Self-pay

## 2019-09-13 VITALS — BP 100/62 | HR 69 | Temp 98.0°F | Resp 17 | Ht 74.0 in | Wt 228.0 lb

## 2019-09-13 DIAGNOSIS — D472 Monoclonal gammopathy: Secondary | ICD-10-CM | POA: Insufficient documentation

## 2019-09-13 DIAGNOSIS — D500 Iron deficiency anemia secondary to blood loss (chronic): Secondary | ICD-10-CM

## 2019-09-13 DIAGNOSIS — D75839 Thrombocytosis, unspecified: Secondary | ICD-10-CM

## 2019-09-13 DIAGNOSIS — Z8572 Personal history of non-Hodgkin lymphomas: Secondary | ICD-10-CM | POA: Insufficient documentation

## 2019-09-13 DIAGNOSIS — D473 Essential (hemorrhagic) thrombocythemia: Secondary | ICD-10-CM

## 2019-09-13 LAB — CMP (CANCER CENTER ONLY)
ALT: 13 U/L (ref 0–44)
AST: 14 U/L — ABNORMAL LOW (ref 15–41)
Albumin: 3.8 g/dL (ref 3.5–5.0)
Alkaline Phosphatase: 82 U/L (ref 38–126)
Anion gap: 10 (ref 5–15)
BUN: 24 mg/dL — ABNORMAL HIGH (ref 6–20)
CO2: 27 mmol/L (ref 22–32)
Calcium: 9.4 mg/dL (ref 8.9–10.3)
Chloride: 102 mmol/L (ref 98–111)
Creatinine: 1.26 mg/dL — ABNORMAL HIGH (ref 0.61–1.24)
GFR, Est AFR Am: >60 mL/min (ref >60)
GFR, Estimated: >60 mL/min (ref >60)
Glucose, Bld: 104 mg/dL — ABNORMAL HIGH (ref 70–99)
Potassium: 3.8 mmol/L (ref 3.5–5.1)
Sodium: 139 mmol/L (ref 135–145)
Total Bilirubin: 0.5 mg/dL (ref 0.3–1.2)
Total Protein: 6.5 g/dL (ref 6.5–8.1)

## 2019-09-13 LAB — CBC WITH DIFFERENTIAL/PLATELET
Abs Immature Granulocytes: 0.02 10*3/uL (ref 0.00–0.07)
Basophils Absolute: 0 10*3/uL (ref 0.0–0.1)
Basophils Relative: 0 %
Eosinophils Absolute: 0.1 10*3/uL (ref 0.0–0.5)
Eosinophils Relative: 1 %
HCT: 45.1 % (ref 39.0–52.0)
Hemoglobin: 14 g/dL (ref 13.0–17.0)
Immature Granulocytes: 0 %
Lymphocytes Relative: 9 %
Lymphs Abs: 1 10*3/uL (ref 0.7–4.0)
MCH: 27.8 pg (ref 26.0–34.0)
MCHC: 31 g/dL (ref 30.0–36.0)
MCV: 89.7 fL (ref 80.0–100.0)
Monocytes Absolute: 1.1 10*3/uL — ABNORMAL HIGH (ref 0.1–1.0)
Monocytes Relative: 10 %
Neutro Abs: 8.8 10*3/uL — ABNORMAL HIGH (ref 1.7–7.7)
Neutrophils Relative %: 80 %
Platelets: 410 10*3/uL — ABNORMAL HIGH (ref 150–400)
RBC: 5.03 MIL/uL (ref 4.22–5.81)
RDW: 21 % — ABNORMAL HIGH (ref 11.5–15.5)
WBC: 11.1 10*3/uL — ABNORMAL HIGH (ref 4.0–10.5)
nRBC: 0 % (ref 0.0–0.2)

## 2019-09-13 LAB — VITAMIN B12: Vitamin B-12: 250 pg/mL (ref 180–914)

## 2019-09-13 LAB — IRON AND TIBC
Iron: 62 ug/dL (ref 42–163)
Saturation Ratios: 20 % (ref 20–55)
TIBC: 311 ug/dL (ref 202–409)
UIBC: 250 ug/dL (ref 117–376)

## 2019-09-13 LAB — FERRITIN: Ferritin: 110 ng/mL (ref 24–336)

## 2019-09-13 NOTE — Telephone Encounter (Signed)
Scheduled appt per 10/13 los.  Spoke with pt and he is aware of the appt date and time.

## 2019-09-21 ENCOUNTER — Ambulatory Visit
Admission: RE | Admit: 2019-09-21 | Discharge: 2019-09-21 | Disposition: A | Discharge status: Home / Self Care | Payer: 59 | Source: Ambulatory Visit | Attending: Geriatric Medicine | Admitting: Geriatric Medicine

## 2019-09-21 ENCOUNTER — Telehealth: Payer: Self-pay | Admitting: *Deleted

## 2019-09-21 ENCOUNTER — Other Ambulatory Visit: Payer: Self-pay | Admitting: Geriatric Medicine

## 2019-09-21 DIAGNOSIS — R519 Headache, unspecified: Secondary | ICD-10-CM

## 2019-09-21 NOTE — Telephone Encounter (Signed)
-----   Message from Rolland Bimler, RN sent at 09/21/2019 11:26 AM EDT -----  ----- Message ----- From: [name], MD Sent: 09/19/2019  10:21 PM EDT To: Rolland Bimler, RN  Plz let patient know ferritin levels at goal >100 (@ 110). B12 levels low at 250 ---would recommend OTC B12 1000 microgram SL daily.

## 2019-09-21 NOTE — Telephone Encounter (Signed)
LM with note below 

## 2019-09-22 ENCOUNTER — Other Ambulatory Visit: Payer: Self-pay | Admitting: Gastroenterology

## 2019-09-26 ENCOUNTER — Other Ambulatory Visit (HOSPITAL_COMMUNITY)
Admission: RE | Admit: 2019-09-26 | Discharge: 2019-09-26 | Disposition: A | Discharge status: Home / Self Care | Payer: 59 | Source: Ambulatory Visit | Attending: Gastroenterology | Admitting: Gastroenterology

## 2019-09-26 DIAGNOSIS — Z01812 Encounter for preprocedural laboratory examination: Secondary | ICD-10-CM | POA: Insufficient documentation

## 2019-09-26 DIAGNOSIS — Z20828 Contact with and (suspected) exposure to other viral communicable diseases: Secondary | ICD-10-CM | POA: Insufficient documentation

## 2019-09-27 ENCOUNTER — Other Ambulatory Visit: Payer: Self-pay

## 2019-09-27 ENCOUNTER — Encounter (HOSPITAL_COMMUNITY): Payer: Self-pay | Admitting: *Deleted

## 2019-09-27 LAB — NOVEL CORONAVIRUS, NAA (HOSP ORDER, SEND-OUT TO REF LAB; TAT 18-24 HRS): SARS-CoV-2, NAA: NOT DETECTED

## 2019-09-27 NOTE — Progress Notes (Addendum)
Spoke with patient via telephone, NPO after MN. Patient to take AM medications with a sip of water. Arrival time 1245.  Patient hx of heart transplant done at [facility] 2017. Managed by [facility] heart transplant team, led by Dr Posey Pronto but directly managed by Dr Mosetta Pigeon. LOV was 06/14/2019 , patient reports since that visit no chest pain , sob, fainting spells, nor palpitations. ECHO done in epic care everywhere.  Hx of CKD  III, managed by cardio team at [facility], stable per lov note.

## 2019-09-29 ENCOUNTER — Ambulatory Visit (HOSPITAL_COMMUNITY): Payer: 59 | Admitting: Anesthesiology

## 2019-09-29 ENCOUNTER — Encounter (HOSPITAL_COMMUNITY): Payer: Self-pay | Admitting: Gastroenterology

## 2019-09-29 ENCOUNTER — Encounter (HOSPITAL_COMMUNITY)
Admission: RE | Disposition: A | Discharge status: Home / Self Care | Payer: Self-pay | Source: Home / Self Care | Attending: Gastroenterology

## 2019-09-29 ENCOUNTER — Other Ambulatory Visit: Payer: Self-pay

## 2019-09-29 ENCOUNTER — Ambulatory Visit (HOSPITAL_COMMUNITY)
Admission: RE | Admit: 2019-09-29 | Discharge: 2019-09-29 | Disposition: A | Discharge status: Home / Self Care | Payer: 59 | Attending: Gastroenterology | Admitting: Gastroenterology

## 2019-09-29 DIAGNOSIS — Z87442 Personal history of urinary calculi: Secondary | ICD-10-CM | POA: Insufficient documentation

## 2019-09-29 DIAGNOSIS — Z888 Allergy status to other drugs, medicaments and biological substances status: Secondary | ICD-10-CM | POA: Insufficient documentation

## 2019-09-29 DIAGNOSIS — K317 Polyp of stomach and duodenum: Secondary | ICD-10-CM | POA: Insufficient documentation

## 2019-09-29 DIAGNOSIS — Z85038 Personal history of other malignant neoplasm of large intestine: Secondary | ICD-10-CM | POA: Diagnosis not present

## 2019-09-29 DIAGNOSIS — Z818 Family history of other mental and behavioral disorders: Secondary | ICD-10-CM | POA: Insufficient documentation

## 2019-09-29 DIAGNOSIS — K2100 Gastro-esophageal reflux disease with esophagitis, without bleeding: Secondary | ICD-10-CM | POA: Insufficient documentation

## 2019-09-29 DIAGNOSIS — Z85828 Personal history of other malignant neoplasm of skin: Secondary | ICD-10-CM | POA: Diagnosis not present

## 2019-09-29 DIAGNOSIS — Z8489 Family history of other specified conditions: Secondary | ICD-10-CM | POA: Diagnosis not present

## 2019-09-29 DIAGNOSIS — Z9221 Personal history of antineoplastic chemotherapy: Secondary | ICD-10-CM | POA: Diagnosis not present

## 2019-09-29 DIAGNOSIS — I130 Hypertensive heart and chronic kidney disease with heart failure and stage 1 through stage 4 chronic kidney disease, or unspecified chronic kidney disease: Secondary | ICD-10-CM | POA: Insufficient documentation

## 2019-09-29 DIAGNOSIS — M5126 Other intervertebral disc displacement, lumbar region: Secondary | ICD-10-CM | POA: Diagnosis not present

## 2019-09-29 DIAGNOSIS — F329 Major depressive disorder, single episode, unspecified: Secondary | ICD-10-CM | POA: Insufficient documentation

## 2019-09-29 DIAGNOSIS — E739 Lactose intolerance, unspecified: Secondary | ICD-10-CM | POA: Diagnosis not present

## 2019-09-29 DIAGNOSIS — I509 Heart failure, unspecified: Secondary | ICD-10-CM | POA: Diagnosis not present

## 2019-09-29 DIAGNOSIS — Z86010 Personal history of colonic polyps: Secondary | ICD-10-CM | POA: Diagnosis not present

## 2019-09-29 DIAGNOSIS — E039 Hypothyroidism, unspecified: Secondary | ICD-10-CM | POA: Insufficient documentation

## 2019-09-29 DIAGNOSIS — N189 Chronic kidney disease, unspecified: Secondary | ICD-10-CM | POA: Diagnosis not present

## 2019-09-29 DIAGNOSIS — Z8571 Personal history of Hodgkin lymphoma: Secondary | ICD-10-CM | POA: Insufficient documentation

## 2019-09-29 DIAGNOSIS — R12 Heartburn: Secondary | ICD-10-CM | POA: Diagnosis present

## 2019-09-29 DIAGNOSIS — Z941 Heart transplant status: Secondary | ICD-10-CM | POA: Insufficient documentation

## 2019-09-29 DIAGNOSIS — Z882 Allergy status to sulfonamides status: Secondary | ICD-10-CM | POA: Insufficient documentation

## 2019-09-29 DIAGNOSIS — Z8249 Family history of ischemic heart disease and other diseases of the circulatory system: Secondary | ICD-10-CM | POA: Diagnosis not present

## 2019-09-29 DIAGNOSIS — Z923 Personal history of irradiation: Secondary | ICD-10-CM | POA: Diagnosis not present

## 2019-09-29 DIAGNOSIS — Z880 Allergy status to penicillin: Secondary | ICD-10-CM | POA: Insufficient documentation

## 2019-09-29 HISTORY — PX: ESOPHAGOGASTRODUODENOSCOPY (EGD) WITH PROPOFOL: SHX5813

## 2019-09-29 SURGERY — ESOPHAGOGASTRODUODENOSCOPY (EGD) WITH PROPOFOL
Anesthesia: Monitor Anesthesia Care

## 2019-09-29 MED ORDER — LACTATED RINGERS IV SOLN
INTRAVENOUS | Status: DC
  Administered 2019-09-29: 1000 mL via INTRAVENOUS

## 2019-09-29 MED ORDER — SODIUM CHLORIDE 0.9 % IV SOLN: INTRAVENOUS | Status: DC

## 2019-09-29 MED ORDER — PROPOFOL 500 MG/50ML IV EMUL
INTRAVENOUS | Status: DC | PRN
  Administered 2019-09-29 (×2): 30 mg via INTRAVENOUS

## 2019-09-29 MED ORDER — PROPOFOL 500 MG/50ML IV EMUL
INTRAVENOUS | Status: DC | PRN
  Administered 2019-09-29: 150 ug/kg/min via INTRAVENOUS

## 2019-09-29 MED ORDER — LACTATED RINGERS IV SOLN
INTRAVENOUS | Status: DC
  Administered 2019-09-29: 14:00:00 via INTRAVENOUS

## 2019-09-29 SURGICAL SUPPLY — 15 items

## 2019-09-29 NOTE — Op Note (Signed)
[facility] Patient Name: Glenn Gonzalez Procedure Date: 09/29/2019 MRN: [id number] Attending MD: Carol Ada , MD Date of Birth: [birth year] CSN: [id number] Age: 58 Admit Type: Inpatient Procedure:                Upper GI endoscopy Indications:              Heartburn Providers:                Carol Ada, MD, Burtis Junes, RN, Ladona Ridgel,                            Technician Referring MD:              Medicines:                 Complications:            No immediate complications. Estimated Blood Loss:     Estimated blood loss: none. Procedure:                Pre-Anesthesia Assessment:                           - Prior to the procedure, a History and Physical                            was performed, and patient medications and                            allergies were reviewed. The patient's tolerance of                            previous anesthesia was also reviewed. The risks                            and benefits of the procedure and the sedation                            options and risks were discussed with the patient.                            All questions were answered, and informed consent                            was obtained. Prior Anticoagulants: The patient has                            taken no previous anticoagulant or antiplatelet                            agents. ASA Grade Assessment: III - A patient with                            severe systemic disease. After reviewing the risks                            and benefits, the patient  was deemed in                            satisfactory condition to undergo the procedure.                           - Sedation was administered by an anesthesia                            professional. Deep sedation was attained.                           After obtaining informed consent, the endoscope was                            passed under direct vision. Throughout the   procedure, the patient's blood pressure, pulse, and                            oxygen saturations were monitored continuously. The                            GIF-H190 ([id number]) Olympus gastroscope was                            introduced through the mouth, and advanced to the                            second part of duodenum. The upper GI endoscopy was                            accomplished without difficulty. The patient                            tolerated the procedure well. Scope In: Scope Out: Findings:      LA Grade A (one or more mucosal breaks less than 5 mm, not extending       between tops of 2 mucosal folds) esophagitis with no bleeding was found       at the gastroesophageal junction.      Multiple 10 mm pedunculated and sessile polyps with no bleeding and no       stigmata of recent bleeding were found in the gastric fundus.      The examined duodenum was normal.      In the distal esophagus there was evidence of stasis of luminal liquid.       In fact, it was clear that one of the ingested medications left an       imprint of ink from the capsule on the mucosa. There was some friability       noted at the GE junction and there was some retained fluid in the       gastric fundus, which resulted in some mild reflux of fluid into the       distal esophagus. Fundic gland polyps were noted, which are secondary to       long-term use of Aciphex. Impression:               -  LA Grade A reflux esophagitis.                           - Multiple gastric polyps.                           - Normal examined duodenum.                           - No specimens collected. Moderate Sedation:      Not Applicable - Patient had care per Anesthesia. Recommendation:           - Patient has a contact number available for                            emergencies. The signs and symptoms of potential                            delayed complications were discussed with the                             patient. Return to normal activities tomorrow.                            Written discharge instructions were provided to the                            patient.                           - Resume previous diet.                           - Continue present medications.                           - Avoid any PO intake for 3-4 hours before sleep.                           - Smaller and possibly more frequent meals.                           - Follow up in one month.                           - TUMS PRN. Procedure Code(s):        --- Professional ---                           [phone], Esophagogastroduodenoscopy, flexible,                            transoral; diagnostic, including collection of                            specimen(s) by brushing or washing, when performed                            (  separate procedure) Diagnosis Code(s):        --- Professional ---                           K21.0, Gastro-esophageal reflux disease with                            esophagitis                           K31.7, Polyp of stomach and duodenum                           R12, Heartburn CPT copyright 2019 [employer]. All rights reserved. The codes documented in this report are preliminary and upon coder review may  be revised to meet current compliance requirements. Carol Ada, MD Carol Ada, MD 09/29/2019 2:33:11 PM This report has been signed electronically. Number of Addenda: 0

## 2019-09-29 NOTE — Anesthesia Preprocedure Evaluation (Signed)
Anesthesia Evaluation  Patient identified by MRN, date of birth, ID band Patient awake    Reviewed: Allergy & Precautions, NPO status , Patient's Chart, lab work & pertinent test results  Airway Mallampati: II  TM Distance: >3 FB Neck ROM: Full    Dental  (+) Dental Advisory Given   Pulmonary shortness of breath and with exertion, pneumonia,    Pulmonary exam normal breath sounds clear to auscultation       Cardiovascular Normal cardiovascular exam Rhythm:Regular Rate:Normal  ECG: ST, rate 103  CP exercise test Conclusion: Exercise testing with gas exchange demonstrates a moderate functional impairment in a healthy population norm. However, PVO2 is more so in normal range for post-heart transplant. There was a significant degree of chronotropic incompetence and hypotensive response to incremental exercise,likely related to denervation, particularly in the setting of exertion. Also note, VE/VCO2 slope is also mildly elevated beyond a widened norm range for heart transplant. Patient's body habitus is playing a role in his exercise intolerance. Of note, pre-exercise spirometry demonstrates mild to moderate restrictive properties.   Hx/o non ischemic CM S/P Heart transplant Hx/o Atrial fibrillation and V. Tach- none since transplant Hx/o AICD placement  [facility]iac [facility] 06-23-18 [facility] 06-23-18 [facility] 02-22-18 chart/epic Echo 05-12-17 chart/epic lov 06-23-18 [facility] chart   Neuro/Psych PSYCHIATRIC DISORDERS Depression negative neurological ROS     GI/Hepatic Neg liver ROS, GERD  Medicated and Controlled,  Endo/Other  Hypothyroidism   Renal/GU Renal disease     Musculoskeletal Gout   Abdominal   Peds  Hematology  (+) Blood dyscrasia, anemia ,   Anesthesia Other Findings BENIGN PROSTATIC HYPERPLASIA  Reproductive/Obstetrics                              Anesthesia Physical  Anesthesia Plan  ASA: III  Anesthesia Plan: MAC   Post-op Pain Management:    Induction: Intravenous  PONV Risk Score and Plan: 1 and Propofol infusion, Treatment may vary due to age or medical condition and Ondansetron  Airway Management Planned: Natural Airway  Additional Equipment:   Intra-op Plan:   Post-operative Plan:   Informed Consent: I have reviewed the patients History and Physical, chart, labs and discussed the procedure including the risks, benefits and alternatives for the proposed anesthesia with the patient or authorized representative who has indicated his/her understanding and acceptance.     Dental advisory given  Plan Discussed with: CRNA  Anesthesia Plan Comments:         Anesthesia Quick Evaluation

## 2019-09-29 NOTE — H&P (Signed)
Glenn Gonzalez HPI: Acutely 2 weeks ago the patient suffered with an acute onset of GERD and vomiting.  The patient denies being ill.  He called the office and he was recommended to use TUMS, which was beneficial for treatment of the pain.  He still has GERD sensations.  For the past twomonths he was bgeing treated for sinusitis, prostatitis, and otitis media with antibiotics.  For many years he takes Aciphex BID for his GERD.  Many years ago he underwent an EGD with an "acid test" and Aciphex was the only medication that successfully suppressed acid.  Past Medical History:  Diagnosis Date  . A-fib (HCC)    IN PAST  . Bulging lumbar disc    states " i threw my back out on tuesday 6-16, i ahve a bulging disc, but i can lie on my back a side "   . Chronic fatigue   . Chronic kidney disease    CREATININE NOW 1.4   . Colon cancer ([place]) DX 2019   SURGERY DONE  . Complication of anesthesia    AFTER HEART TRANSPLANT TOOK 2 YEARS TO GET BACK TO SELF; patient does not want aneshesia for endoscopy procedures   . Depression    SITUATIONAL  . GERD (gastroesophageal reflux disease)   . History of chemotherapy    DAMAGED HEART MUSCLE WITH RADIATION ALSO  . History of kidney stones   . History of radiation therapy   . Hodgkin's lymphoma ([place]) 1980   IIIB. 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  . Hypothyroidism   . Pneumonia FROM CHF 2016   recurrent pneumonia sedf.  rad tx for lymphoma  . Shortness of breath dyspnea    with exertion  . Skin cancer    AREAS REMOVED FROM [facility], CHECK AND BACK AND HEAD SEPT 2019  . Status post heart transplantation ([place]) 2017    Past Surgical History:  Procedure Laterality Date  . BIOPSY  07/02/2018   Procedure: BIOPSY;  Surgeon: Carol Ada, MD;  Location: [facility];  Service: Endoscopy;;  . BIOPSY  05/20/2019   Procedure: BIOPSY;  Surgeon: Carol Ada, MD;  Location: [facility];  Service: Endoscopy;;  . CARDIAC CATHETERIZATION N/A  04/20/2015   Procedure: Right/Left Heart Cath and Coronary Angiography;  Surgeon: Jolaine Artist, MD;  Location: [facility];  Service: Cardiovascular;  Laterality: N/A;  . CARDIAC CATHETERIZATION  10-11-15   pt states routine heart [facility]   . CARDIAC CATHETERIZATION N/A 04/02/2016   Procedure: Right Heart Cath;  Surgeon: Jolaine Artist, MD;  Location: [place] [facility];  Service: Cardiovascular;  Laterality: N/A;  . CARDIAC CATHETERIZATION  JULY 2019 AT [facility]  . COLONOSCOPY N/A 07/02/2018   Procedure: COLONOSCOPY;  Surgeon: Carol Ada, MD;  [facility];  Service: Endoscopy;  Laterality: N/A;  . COLONOSCOPY N/A 08/05/2019   Procedure: COLONOSCOPY;  Surgeon: Carol Ada, MD;  [facility];  Service: Endoscopy;  Laterality: N/A;  . COLONOSCOPY WITH PROPOFOL N/A 08/03/2015   Procedure: COLONOSCOPY WITH PROPOFOL;  Surgeon: Carol Ada, MD;  [facility];  Service: Endoscopy;  Laterality: N/A;  wants to try to do procedure without sedation  . CYSTOSCOPY WITH INSERTION OF UROLIFT N/A 10/18/2018   Procedure: CYSTOSCOPY WITH INSERTION OF UROLIFT;  Surgeon: Cleon Gustin, MD;  Location: [facility];  Service: Urology;  Laterality: N/A;  30 MINS  . CYSTOSCOPY WITH RETROGRADE PYELOGRAM, URETEROSCOPY AND STENT  PLACEMENT Right 10/12/2015   Procedure: CYSTOSCOPY WITH RETROGRADE PYELOGRAM, URETEROSCOPY AND STENT PLACEMENT;  Surgeon: Cleon Gustin, MD;  [facility];  Service: Urology;  Laterality: Right;  . ESOPHAGOGASTRODUODENOSCOPY    . EXPLORATORY LAPAROTOMY    . FLEXIBLE SIGMOIDOSCOPY N/A 05/20/2019   Procedure: FLEXIBLE SIGMOIDOSCOPY;  Surgeon: Carol Ada, MD;  [facility];  Service: Endoscopy;  Laterality: N/A;  . gynemastia Bilateral   . HEART TRANSPLANT  06/20/2016   at [facility]  . HOT HEMOSTASIS N/A 07/02/2018   Procedure: HOT HEMOSTASIS (ARGON PLASMA COAGULATION/BICAP);  Surgeon: Carol Ada, MD;   Location: [facility];  Service: Endoscopy;  Laterality: N/A;  . LUMBAR LAMINECTOMY/DECOMPRESSION MICRODISCECTOMY Right 01/04/2016   Procedure: Microdiscectomy Lumbar four Lumbar five right;  Surgeon: Eustace Moore, MD;  Location: [place] [facility];  Service: Neurosurgery;  Laterality: Right;  . neck biopses     x5  . 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 POLYPECTOMY  07/02/2018   Procedure: POLYPECTOMY;  Surgeon: Carol Ada, MD;  [facility];  Service: Endoscopy;;  . POLYPECTOMY  08/05/2019   Procedure: POLYPECTOMY;  Surgeon: Carol Ada, MD;  [facility];  Service: Endoscopy;;  . PORTACATH PLACEMENT     06-29-15 inserted, now removed.  . SPLENECTOMY, TOTAL  1981  . STONE EXTRACTION WITH BASKET Right 10/12/2015   Procedure: STONE EXTRACTION WITH BASKET;  Surgeon: Cleon Gustin, MD;  [facility];  Service: Urology;  Laterality: Right;  . SUBMUCOSAL INJECTION  07/02/2018   Procedure: SUBMUCOSAL INJECTION;  Surgeon: Carol Ada, MD;  [facility];  Service: Endoscopy;;  . TONSILLECTOMY    . VARICELE REPAIR  40 YRS AGO    Family History  Problem Relation Age of Onset  . Hyperthyroidism Mother   . Insomnia Mother   . Hypertension Father   . Depression Father   . Insomnia Father     Social History:  reports that he has never smoked. He has never used smokeless tobacco. He reports that he does not drink alcohol or use drugs.  Allergies:  Allergies  Allergen Reactions  . Digoxin Other (See Comments)    gynecomastia   . Phencyclidine Other (See Comments)    PCP derived antibiotic > Insomnia  . Spironolactone Other (See Comments)    Gynecomastia  . Lactose Intolerance (Gi) Diarrhea and Other (See Comments)    cramps  . Penicillins Other (See Comments)    Cramps Did it involve swelling of the face/tongue/throat, SOB, or low BP? No Did it involve sudden or severe rash/hives, skin peeling, or any  reaction on the inside of your mouth or nose? No Did you need to seek medical attention at a hospital or doctor's office? No When did it last happen?unknown If all above answers are "NO", may proceed with cephalosporin use.     . Sulfur Rash    Medications:  Scheduled:  Continuous: . sodium chloride    . lactated ringers 1,000 mL (09/29/19 1250)  . lactated ringers      No results found for this or any previous visit (from the past 24 hour(s)).   No results found.  ROS:  As stated above in the HPI otherwise negative.  Blood pressure (!) 145/74, pulse 89, temperature (!) 97.5 F (36.4 C), temperature source Oral, resp. rate (!) 21, SpO2 100 %.    PE: Gen: NAD, Alert and Oriented HEENT:  [place]/AT, EOMI Neck: Supple, no LAD Lungs:  CTA Bilaterally CV: RRR without M/G/R ABM: Soft, NTND, +BS Ext: No C/C/E  Assessment/Plan: 1) GERD - EGD.  Angeleigh Chiasson D 09/29/2019, 12:53 PM

## 2019-09-29 NOTE — Discharge Instructions (Signed)
YOU HAD AN ENDOSCOPIC PROCEDURE TODAY: Refer to the procedure report and other information in the discharge instructions given to you for any specific questions about what was found during the examination. If this information does not answer your questions, please call [facility] at [phone] to clarify.   YOU SHOULD EXPECT: Some feelings of bloating in the abdomen. Passage of more gas than usual. Walking can help get rid of the air that was put into your GI tract during the procedure and reduce the bloating. If you had a lower endoscopy (such as a colonoscopy or flexible sigmoidoscopy) you may notice spotting of blood in your stool or on the toilet paper. Some abdominal soreness may be present for a day or two, also.  DIET: Your first meal following the procedure should be a light meal and then it is ok to progress to your normal diet. A half-sandwich or bowl of soup is an example of a good first meal. Heavy or fried foods are harder to digest and may make you feel nauseous or bloated. Drink plenty of fluids but you should avoid alcoholic beverages for 24 hours. If you had an esophageal dilation, please see attached information for diet. Eat smaller meals more often during the day (than 3 big meals); stay sitting up for an hour after eating or drinking.  Avoid eating 3-4 hours before bedtime.   ACTIVITY: Your care partner should take you home directly after the procedure. You should plan to take it easy, moving slowly for the rest of the day. You can resume normal activity the day after the procedure however YOU SHOULD NOT DRIVE, use power tools, machinery or perform tasks that involve climbing or major physical exertion for 24 hours (because of the sedation medicines used during the test).   SYMPTOMS TO REPORT IMMEDIATELY: A gastroenterologist can be reached at any hour. Please call [phone]  for any of the following symptoms:  Following upper endoscopy (EGD, EUS, ERCP, esophageal  dilation) Vomiting of blood or coffee ground material  New, significant abdominal pain  New, significant chest pain or pain under the shoulder blades  Painful or persistently difficult swallowing  New shortness of breath  Black, tarry-looking or red, bloody stools  FOLLOW UP:  If any biopsies were taken you will be contacted by phone or by letter within the next 1-3 weeks. Call [phone]  if you have not heard about the biopsies in 3 weeks.  Please also call with any specific questions about appointments or follow up tests.

## 2019-09-29 NOTE — Transfer of Care (Signed)
Immediate Anesthesia Transfer of Care Note  Patient: Glenn Gonzalez  Procedure(s) Performed: ESOPHAGOGASTRODUODENOSCOPY (EGD) WITH PROPOFOL (N/A )  Patient Location: PACU  Anesthesia Type:MAC  Level of Consciousness: awake, alert  and oriented  Airway & Oxygen Therapy: Patient Spontanous Breathing and Patient connected to nasal cannula oxygen  Post-op Assessment: Report given to RN and Post -op Vital signs reviewed and stable  Post vital signs: Reviewed and stable  Last Vitals:  Vitals Value Taken Time  BP    Temp    Pulse    Resp    SpO2      Last Pain:  Vitals:   09/29/19 1239  TempSrc: Oral  PainSc: 2          Complications: No apparent anesthesia complications

## 2019-09-30 NOTE — Anesthesia Postprocedure Evaluation (Signed)
Anesthesia Post Note  Patient: Glenn Gonzalez  Procedure(s) Performed: ESOPHAGOGASTRODUODENOSCOPY (EGD) WITH PROPOFOL (N/A )     Patient location during evaluation: PACU Anesthesia Type: MAC Level of consciousness: awake and alert Pain management: pain level controlled Vital Signs Assessment: post-procedure vital signs reviewed and stable Respiratory status: spontaneous breathing Cardiovascular status: stable Anesthetic complications: no    Last Vitals:  Vitals:   09/29/19 1440 09/29/19 1500  BP: 123/65 (!) 153/78  Pulse: 74 76  Resp: (!) 23 13  Temp:    SpO2: 100% 98%    Last Pain:  Vitals:   09/29/19 1430  TempSrc: Oral  PainSc: 0-No pain                 Nolon Nations

## 2019-10-19 ENCOUNTER — Other Ambulatory Visit: Payer: Self-pay

## 2019-10-19 DIAGNOSIS — Z20822 Contact with and (suspected) exposure to covid-19: Secondary | ICD-10-CM

## 2019-10-21 LAB — NOVEL CORONAVIRUS, NAA: SARS-CoV-2, NAA: NOT DETECTED

## 2019-11-17 ENCOUNTER — Other Ambulatory Visit: Payer: Self-pay | Admitting: Hematology

## 2019-11-17 ENCOUNTER — Encounter: Payer: Self-pay | Admitting: Hematology

## 2019-11-17 ENCOUNTER — Telehealth: Payer: Self-pay | Admitting: *Deleted

## 2019-11-17 NOTE — Telephone Encounter (Signed)
Patient called with concerns r/t recent Ferritin level of 27 when labs completed at [facility]. Asked if he had other lab results, he stated he did and sent them via [facility]. Dr. Irene Limbo informed.  He reviewed labs and placed order for IV iron for patient. Once authorized, it will be scheduled.  Contacted patient with this information. He verbalized understanding that scheduling will contact him for appt once IV iron authorized by insurance.

## 2019-11-22 ENCOUNTER — Telehealth: Payer: Self-pay | Admitting: Hematology

## 2019-11-22 NOTE — Telephone Encounter (Signed)
Scheduled appt per 12/22 sch message - pt is aware of appt date and time

## 2019-11-23 ENCOUNTER — Other Ambulatory Visit: Payer: Self-pay | Admitting: Hematology

## 2019-11-24 ENCOUNTER — Inpatient Hospital Stay: Payer: 59 | Attending: Hematology

## 2019-11-24 ENCOUNTER — Other Ambulatory Visit: Payer: Self-pay

## 2019-11-24 VITALS — BP 142/94 | HR 79 | Temp 97.9°F | Resp 16

## 2019-11-24 DIAGNOSIS — D500 Iron deficiency anemia secondary to blood loss (chronic): Secondary | ICD-10-CM | POA: Diagnosis present

## 2019-11-24 MED ORDER — SODIUM CHLORIDE 0.9 % IV SOLN
Freq: Once | INTRAVENOUS | Status: AC
  Filled 2019-11-24: qty 250

## 2019-11-24 MED ORDER — LORATADINE 10 MG PO TABS: 10.0000 mg | ORAL_TABLET | Freq: Once | ORAL | Status: DC

## 2019-11-24 MED ORDER — SODIUM CHLORIDE 0.9 % IV SOLN
750.0000 mg | Freq: Once | INTRAVENOUS | Status: AC
  Administered 2019-11-24: 750 mg via INTRAVENOUS
  Filled 2019-11-24: qty 15

## 2019-11-24 MED ORDER — ACETAMINOPHEN 325 MG PO TABS
650.0000 mg | ORAL_TABLET | Freq: Once | ORAL | Status: AC
  Administered 2019-11-24: 650 mg via ORAL

## 2019-11-24 MED ORDER — ACETAMINOPHEN 325 MG PO TABS
ORAL_TABLET | ORAL | Status: AC
  Filled 2019-11-24: qty 2

## 2019-11-24 NOTE — Patient Instructions (Signed)
Ferric carboxymaltose injection What is this medicine? FERRIC CARBOXYMALTOSE (ferr-ik car-box-ee-mol-toes) is an iron complex. Iron is used to make healthy red blood cells, which carry oxygen and nutrients throughout the body. This medicine is used to treat anemia in people with chronic kidney disease or people who cannot take iron by mouth. This medicine may be used for other purposes; ask your health care provider or pharmacist if you have questions. COMMON BRAND NAME(S): Injectafer What should I tell my health care provider before I take this medicine? They need to know if you have any of these conditions:  high levels of iron in the blood  liver disease  an unusual or allergic reaction to iron, other medicines, foods, dyes, or preservatives  pregnant or trying to get pregnant  breast-feeding How should I use this medicine? This medicine is for infusion into a vein. It is given by a health care professional in a hospital or clinic setting. Talk to your pediatrician regarding the use of this medicine in children. Special care may be needed. Overdosage: If you think you have taken too much of this medicine contact a poison control center or emergency room at once. NOTE: This medicine is only for you. Do not share this medicine with others. What if I miss a dose? It is important not to miss your dose. Call your doctor or health care professional if you are unable to keep an appointment. What may interact with this medicine? Do not take this medicine with any of the following medications:  deferoxamine  dimercaprol  other iron products This list may not describe all possible interactions. Give your health care provider a list of all the medicines, herbs, non-prescription drugs, or dietary supplements you use. Also tell them if you smoke, drink alcohol, or use illegal drugs. Some items may interact with your medicine. What should I watch for while using this medicine? Visit your  doctor or health care professional regularly. Tell your doctor if your symptoms do not start to get better or if they get worse. You may need blood work done while you are taking this medicine. You may need to follow a special diet. Talk to your doctor. Foods that contain iron include: whole grains/cereals, dried fruits, beans, or peas, leafy green vegetables, and organ meats (liver, kidney). What side effects may I notice from receiving this medicine? Side effects that you should report to your doctor or health care professional as soon as possible:  allergic reactions like skin rash, itching or hives, swelling of the face, lips, or tongue  dizziness  facial flushing Side effects that usually do not require medical attention (report to your doctor or health care professional if they continue or are bothersome):  changes in taste  constipation  headache  nausea, vomiting  pain, redness, or irritation at site where injected This list may not describe all possible side effects. Call your doctor for medical advice about side effects. You may report side effects to FDA at 1-800-FDA-1088. Where should I keep my medicine? This drug is given in a hospital or clinic and will not be stored at home. NOTE: This sheet is a summary. It may not cover all possible information. If you have questions about this medicine, talk to your doctor, pharmacist, or health care provider.  2020 Elsevier/Gold Standard (2017-01-01 09:40:29)  Coronavirus (COVID-19) Are you at risk?  Are you at risk for the Coronavirus (COVID-19)?  To be considered HIGH RISK for Coronavirus (COVID-19), you have to meet the following   criteria:  . Traveled to [place], [place], [place], [place] or [place]; or in the [place] to [place], [place], [place], or [place]; and have fever, cough, and shortness of breath within the last 2 weeks of travel OR . Been in close contact with a person diagnosed with COVID-19 within the  last 2 weeks and have fever, cough, and shortness of breath . IF YOU DO NOT MEET THESE CRITERIA, YOU ARE CONSIDERED LOW RISK FOR COVID-19.  What to do if you are HIGH RISK for COVID-19?  . If you are having a medical emergency, call 911. . Seek medical care right away. Before you go to a doctor's office, urgent care or emergency department, call ahead and tell them about your recent travel, contact with someone diagnosed with COVID-19, and your symptoms. You should receive instructions from your physician's office regarding next steps of care.  . When you arrive at healthcare provider, tell the healthcare staff immediately you have returned from visiting [place], [place], [place], [place] or [place]; or traveled in the [place] to [place], [place], [place], or [place]; in the last two weeks or you have been in close contact with a person diagnosed with COVID-19 in the last 2 weeks.   . Tell the health care staff about your symptoms: fever, cough and shortness of breath. . After you have been seen by a medical provider, you will be either: o Tested for (COVID-19) and discharged home on quarantine except to seek medical care if symptoms worsen, and asked to  - Stay home and avoid contact with others until you get your results (4-5 days)  - Avoid travel on public transportation if possible (such as bus, train, or airplane) or o Sent to the Emergency Department by EMS for evaluation, COVID-19 testing, and possible admission depending on your condition and test results.  What to do if you are LOW RISK for COVID-19?  Reduce your risk of any infection by using the same precautions used for avoiding the common cold or flu:  . Wash your hands often with soap and warm water for at least 20 seconds.  If soap and water are not readily available, use an alcohol-based hand sanitizer with at least 60% alcohol.  . If coughing or sneezing, cover your mouth and nose by coughing or sneezing into the elbow  areas of your shirt or coat, into a tissue or into your sleeve (not your hands). . Avoid shaking hands with others and consider head nods or verbal greetings only. . Avoid touching your eyes, nose, or mouth with unwashed hands.  . Avoid close contact with people who are sick. . Avoid places or events with large numbers of people in one location, like concerts or sporting events. . Carefully consider travel plans you have or are making. . If you are planning any travel outside or inside the [place], visit the CDC's Travelers' Health webpage for the latest health notices. . If you have some symptoms but not all symptoms, continue to monitor at home and seek medical attention if your symptoms worsen. . If you are having a medical emergency, call 911.   ADDITIONAL HEALTHCARE OPTIONS FOR PATIENTS  [facility] / e-Visit: [email or web address]         [facility]: [phone]  [facility]: [phone]                   [facility]: [phone]   

## 2019-12-01 ENCOUNTER — Telehealth: Payer: Self-pay | Admitting: *Deleted

## 2019-12-01 ENCOUNTER — Other Ambulatory Visit: Payer: Self-pay

## 2019-12-01 ENCOUNTER — Ambulatory Visit (INDEPENDENT_AMBULATORY_CARE_PROVIDER_SITE_OTHER): Payer: 59 | Admitting: Podiatry

## 2019-12-01 DIAGNOSIS — L603 Nail dystrophy: Secondary | ICD-10-CM | POA: Diagnosis not present

## 2019-12-01 DIAGNOSIS — B351 Tinea unguium: Secondary | ICD-10-CM

## 2019-12-01 NOTE — Telephone Encounter (Signed)
Patient called to ask if the iron infusion he received last Friday would have any impact on his blood pressure. States top number over 150 the past few days and went over 170 last night. Advised patient that any impact iron will have on his blood pressure was during infusion (most likely) and only slightly in the next 24 hours. Encouraged patient to follow up with PCP if b/p continues to be concerning to him.  He verbalized understanding.

## 2019-12-01 NOTE — Progress Notes (Signed)
Subjective: 58 year old male presents the office today for follow-up evaluation of toenail dystrophy as well likely fungus of the nails.  He states the nails are getting better although slowly.  Denies any redness or drainage of the toenail sites.  He needs a refill of the topical medication.  Overall he states he is been feeling much better as he was found out he was anemic and since treatment he has been feeling much better overall. Denies any systemic complaints such as fevers, chills, nausea, vomiting. No acute changes since last appointment, and no other complaints at this time.   Objective: AAO x3, NAD DP/PT pulses palpable bilaterally, CRT less than 3 seconds Bilateral hallux tones are hypertrophic, dystrophic with yellow-brown discoloration.  There is clearing on the proximal one half of the toenails.  It appears the nails are growing out healthier. No pain with calf compression, swelling, warmth, erythema  Assessment: Onychodystrophy, onychomycosis bilateral hallux  Plan: -All treatment options discussed with the patient including all alternatives, risks, complications.  -Debrided bilateral hallux nails without any complications or bleeding as a courtesy.  Continue with topical antifungal as well as urea.  Refilled today.  Monitoring signs or symptoms of infection or side effects of medications. -Patient encouraged to call the office with any questions, concerns, change in symptoms.   Trula Slade DPM

## 2019-12-16 ENCOUNTER — Ambulatory Visit: Payer: 59 | Attending: Internal Medicine

## 2019-12-16 DIAGNOSIS — Z20822 Contact with and (suspected) exposure to covid-19: Secondary | ICD-10-CM

## 2019-12-17 LAB — NOVEL CORONAVIRUS, NAA: SARS-CoV-2, NAA: NOT DETECTED

## 2019-12-23 ENCOUNTER — Telehealth: Payer: Self-pay | Admitting: *Deleted

## 2019-12-23 NOTE — Telephone Encounter (Signed)
Patient called. He had more energy following his iron infusions but is experiencing greater fatigue again. His O2 sats initally were 98-100 follwoing infusion (he checks them at home), but now stay around 92 unless he takes very deep breaths. He is not short of breath at any point. No pain/fever. Covid test negative 12/16/19. His next appt here is in April. He wants to know if he should come here to get his iron checked now? Dr. Irene Limbo informed of patient concerns.  Dr. Irene Limbo response: iron levels would not affect his oxygen saturation -- that would need to be separately evaluated. From a fatigue and iron deficiency standpoint he could have rpt labs and phone visit in 6 weeks- 2 months after last iron infusion to have accurate baseline.  Patient contracted and is in agreement with MD plan. He will f/u with PCP/cardiologist regarding low O2 sats. Schedule messages sent

## 2019-12-26 ENCOUNTER — Other Ambulatory Visit: Payer: Self-pay | Admitting: Geriatric Medicine

## 2019-12-26 ENCOUNTER — Telehealth: Payer: Self-pay | Admitting: Hematology

## 2019-12-26 DIAGNOSIS — M79661 Pain in right lower leg: Secondary | ICD-10-CM

## 2019-12-26 DIAGNOSIS — M79604 Pain in right leg: Secondary | ICD-10-CM

## 2019-12-26 NOTE — Telephone Encounter (Signed)
Scheduled per 1/22 sch msg. Called and spoke with pt, confirmed 2/25 and 3/4 appt

## 2019-12-27 ENCOUNTER — Ambulatory Visit
Admission: RE | Admit: 2019-12-27 | Discharge: 2019-12-27 | Disposition: A | Discharge status: Home / Self Care | Payer: 59 | Source: Ambulatory Visit | Attending: Geriatric Medicine | Admitting: Geriatric Medicine

## 2019-12-27 DIAGNOSIS — M79661 Pain in right lower leg: Secondary | ICD-10-CM

## 2019-12-27 DIAGNOSIS — M79604 Pain in right leg: Secondary | ICD-10-CM

## 2020-01-25 ENCOUNTER — Other Ambulatory Visit: Payer: Self-pay | Admitting: *Deleted

## 2020-01-25 DIAGNOSIS — D500 Iron deficiency anemia secondary to blood loss (chronic): Secondary | ICD-10-CM

## 2020-01-26 ENCOUNTER — Inpatient Hospital Stay: Payer: 59 | Attending: Hematology

## 2020-01-26 ENCOUNTER — Other Ambulatory Visit: Payer: Self-pay

## 2020-01-26 DIAGNOSIS — D500 Iron deficiency anemia secondary to blood loss (chronic): Secondary | ICD-10-CM | POA: Insufficient documentation

## 2020-01-26 LAB — FERRITIN: Ferritin: 212 ng/mL (ref 24–336)

## 2020-01-26 LAB — CMP (CANCER CENTER ONLY)
ALT: 20 U/L (ref 0–44)
AST: 17 U/L (ref 15–41)
Albumin: 4 g/dL (ref 3.5–5.0)
Alkaline Phosphatase: 90 U/L (ref 38–126)
Anion gap: 10 (ref 5–15)
BUN: 24 mg/dL — ABNORMAL HIGH (ref 6–20)
CO2: 24 mmol/L (ref 22–32)
Calcium: 9.3 mg/dL (ref 8.9–10.3)
Chloride: 105 mmol/L (ref 98–111)
Creatinine: 1.41 mg/dL — ABNORMAL HIGH (ref 0.61–1.24)
GFR, Est AFR Am: >60 mL/min (ref >60)
GFR, Estimated: 55 mL/min — ABNORMAL LOW (ref >60)
Glucose, Bld: 101 mg/dL — ABNORMAL HIGH (ref 70–99)
Potassium: 4.4 mmol/L (ref 3.5–5.1)
Sodium: 139 mmol/L (ref 135–145)
Total Bilirubin: 0.6 mg/dL (ref 0.3–1.2)
Total Protein: 6.9 g/dL (ref 6.5–8.1)

## 2020-01-26 LAB — CBC WITH DIFFERENTIAL (CANCER CENTER ONLY)
Abs Immature Granulocytes: 0.05 10*3/uL (ref 0.00–0.07)
Basophils Absolute: 0.1 10*3/uL (ref 0.0–0.1)
Basophils Relative: 1 %
Eosinophils Absolute: 0.3 10*3/uL (ref 0.0–0.5)
Eosinophils Relative: 2 %
HCT: 49.7 % (ref 39.0–52.0)
Hemoglobin: 16.2 g/dL (ref 13.0–17.0)
Immature Granulocytes: 0 %
Lymphocytes Relative: 9 %
Lymphs Abs: 1.1 10*3/uL (ref 0.7–4.0)
MCH: 31.6 pg (ref 26.0–34.0)
MCHC: 32.6 g/dL (ref 30.0–36.0)
MCV: 97.1 fL (ref 80.0–100.0)
Monocytes Absolute: 1.1 10*3/uL — ABNORMAL HIGH (ref 0.1–1.0)
Monocytes Relative: 9 %
Neutro Abs: 9 10*3/uL — ABNORMAL HIGH (ref 1.7–7.7)
Neutrophils Relative %: 79 %
Platelet Count: 340 10*3/uL (ref 150–400)
RBC: 5.12 MIL/uL (ref 4.22–5.81)
RDW: 16.4 % — ABNORMAL HIGH (ref 11.5–15.5)
WBC Count: 11.5 10*3/uL — ABNORMAL HIGH (ref 4.0–10.5)
nRBC: 0 % (ref 0.0–0.2)

## 2020-01-26 LAB — IRON AND TIBC
Iron: 105 ug/dL (ref 42–163)
Saturation Ratios: 37 % (ref 20–55)
TIBC: 284 ug/dL (ref 202–409)
UIBC: 179 ug/dL (ref 117–376)

## 2020-02-01 NOTE — Progress Notes (Signed)
HEMATOLOGY/ONCOLOGY CLINIC NOTE  Date of Service: 02/01/2020  Patient Care Team: Lajean Manes, MD as PCP - General (Internal Medicine)  CHIEF COMPLAINTS/PURPOSE OF CONSULTATION:  Thrombocytosis Iron deficiency Leucocytosis   HISTORY OF PRESENTING ILLNESS:   Glenn Gonzalez is a wonderful 59 y.o. male who has been referred to [place] by Dr. Felipa Eth for evaluation and management of thrombocytosis. The pt reports that he is doing well overall.  The pt reports a hx of Hodgkin's lymphoma in the 1980s. He was treated at [facility] with maximum cycles of MOP and ABVD, as well as spade radiation. Denies bone marrow involvement.    He also has a hx of melanoma. Most of the spots were on his chest around the site of his radiation. He has had over 20 spots removed, the most recent on his scalp. He manages this with [facility].  He was diagnosed with colon cancer last year and has had several polyps removed. He has been getting sigmoidoscopies every 3 months and colonoscopies yearly.  He notes a recent prostate infection, which is being followed by a urologist. His most recent PSA was 2.66.  He is currently taking 2.5 mg prednisone and ASA. He is also taking Doxycycline 100 mg BID after the removal of the spot on his scalp.  Most recent lab results (06/14/2019) of CBC is as follows: all values are WNL except for WBC at 11.3k, HGB at 11.8, HCT at 38.7, MCH at 25.2, MCHC at 30.5, RDW at 18.2, PLT at 513  On review of systems, pt reports bladder pain, body soreness and denies mouth sores, belly pain, mouth soreness, and any other symptoms.   On PMHx the pt reports hx of Hodgkins lymphoma, heart transplant, melanoma, colon cancer, multiple catheter-related blood clots after surgery On FHx the pt reports that his brother had colon cancer with polyps  Interval History  Glenn Gonzalez is a 59 y.o. male being called today for the management and evaluation of thrombocytosis and  leukocytosis and verified that I am speaking with the correct person using two identifiers.   I discussed the limitations, risks, security and privacy concerns of performing an evaluation and management service by telemedicine and the availability of in-person appointments. I also discussed with the patient that there may be a patient responsible charge related to this service. The patient expressed understanding and agreed to proceed.   Other persons participating in the visit and their role in the encounter:          -Dawayne Cirri, Medical Scribe   Patient's location: Home  Provider's location: [place] [facility] [employer] Complaint: Management and evaluation of thrombocytosis and leukocytosis  The pt reports he is no longer SOB but is still fatigued. Pt thinks that anti-rejection medication is part of the cause of fatigue. Pt reports having a sinus infection longer than 6 months ago, but no longer has infection. He has been sleeping well and has had no concern with depression.    Lab results today (01/26/20) of CBC w/diff and CMP is as follows: all values are WNL except for WBC Count at 11.K, RDW at 16.4, Neutro Abs at 9.0K, Monocytes Absolute at 1.1K, Glucose at 101, BUN at 24, Creatinine at 1.41, GFR, Est Non Af Am at 55. (01/26/2020) of Ferritin is WNL (01/26/2020) of Iron and TIBC is WNL   MEDICAL HISTORY:  Past Medical History:  Diagnosis Date  . A-fib (HCC)    IN PAST  .  Bulging lumbar disc    states " i threw my back out on tuesday 6-16, i ahve a bulging disc, but i can lie on my back a side "   . Chronic fatigue   . Chronic kidney disease    CREATININE NOW 1.4   . Colon cancer ([place]) DX 2019   SURGERY DONE  . Complication of anesthesia    AFTER HEART TRANSPLANT TOOK 2 YEARS TO GET BACK TO SELF; patient does not want aneshesia for endoscopy procedures   . Depression    SITUATIONAL  . GERD (gastroesophageal reflux disease)   . History of chemotherapy    DAMAGED HEART  MUSCLE WITH RADIATION ALSO  . History of kidney stones   . History of radiation therapy   . Hodgkin's lymphoma ([place]) 1980   IIIB. 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  . Hypothyroidism   . Pneumonia FROM CHF 2016   recurrent pneumonia sedf.  rad tx for lymphoma  . Shortness of breath dyspnea    with exertion  . Skin cancer    AREAS REMOVED FROM [facility], CHECK AND BACK AND HEAD SEPT 2019  . Status post heart transplantation ([place]) 2017    SURGICAL HISTORY: Past Surgical History:  Procedure Laterality Date  . BIOPSY  07/02/2018   Procedure: BIOPSY;  Surgeon: Carol Ada, MD;  Location: [facility];  Service: Endoscopy;;  . BIOPSY  05/20/2019   Procedure: BIOPSY;  Surgeon: Carol Ada, MD;  Location: [facility];  Service: Endoscopy;;  . CARDIAC CATHETERIZATION N/A 04/20/2015   Procedure: Right/Left Heart Cath and Coronary Angiography;  Surgeon: Jolaine Artist, MD;  Location: [place] CV LAB;  Service: Cardiovascular;  Laterality: N/A;  . CARDIAC CATHETERIZATION  10-11-15   pt states routine heart cath to be done at [facility]   . CARDIAC CATHETERIZATION N/A 04/02/2016   Procedure: Right Heart Cath;  Surgeon: Jolaine Artist, MD;  Location: [place] [facility];  Service: Cardiovascular;  Laterality: N/A;  . CARDIAC CATHETERIZATION  JULY 2019 AT [facility]  . COLONOSCOPY N/A 07/02/2018   Procedure: COLONOSCOPY;  Surgeon: Carol Ada, MD;  [facility];  Service: Endoscopy;  Laterality: N/A;  . COLONOSCOPY N/A 08/05/2019   Procedure: COLONOSCOPY;  Surgeon: Carol Ada, MD;  [facility];  Service: Endoscopy;  Laterality: N/A;  . COLONOSCOPY WITH PROPOFOL N/A 08/03/2015   Procedure: COLONOSCOPY WITH PROPOFOL;  Surgeon: Carol Ada, MD;  [facility];  Service: Endoscopy;  Laterality: N/A;  wants to try to do procedure without sedation  . CYSTOSCOPY WITH INSERTION OF UROLIFT N/A 10/18/2018   Procedure: CYSTOSCOPY WITH INSERTION OF UROLIFT;  Surgeon:  Cleon Gustin, MD;  Location: [facility];  Service: Urology;  Laterality: N/A;  30 MINS  . CYSTOSCOPY WITH RETROGRADE PYELOGRAM, URETEROSCOPY AND STENT PLACEMENT Right 10/12/2015   Procedure: CYSTOSCOPY WITH RETROGRADE PYELOGRAM, URETEROSCOPY AND STENT PLACEMENT;  Surgeon: Cleon Gustin, MD;  [facility];  Service: Urology;  Laterality: Right;  . ESOPHAGOGASTRODUODENOSCOPY    . ESOPHAGOGASTRODUODENOSCOPY (EGD) WITH PROPOFOL N/A 09/29/2019   Procedure: ESOPHAGOGASTRODUODENOSCOPY (EGD) WITH PROPOFOL;  Surgeon: Carol Ada, MD;  [facility];  Service: Endoscopy;  Laterality: N/A;  . EXPLORATORY LAPAROTOMY    . FLEXIBLE SIGMOIDOSCOPY N/A 05/20/2019   Procedure: FLEXIBLE SIGMOIDOSCOPY;  Surgeon: Carol Ada, MD;  [facility];  Service: Endoscopy;  Laterality: N/A;  . gynemastia Bilateral   . HEART TRANSPLANT  06/20/2016   at [facility]  . HOT HEMOSTASIS  N/A 07/02/2018   Procedure: HOT HEMOSTASIS (ARGON PLASMA COAGULATION/BICAP);  Surgeon: Carol Ada, MD;  Location: [facility];  Service: Endoscopy;  Laterality: N/A;  . LUMBAR LAMINECTOMY/DECOMPRESSION MICRODISCECTOMY Right 01/04/2016   Procedure: Microdiscectomy Lumbar four Lumbar five right;  Surgeon: Eustace Moore, MD;  Location: [place] [facility];  Service: Neurosurgery;  Laterality: Right;  . neck biopses     x5  . 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 POLYPECTOMY  07/02/2018   Procedure: POLYPECTOMY;  Surgeon: Carol Ada, MD;  [facility];  Service: Endoscopy;;  . POLYPECTOMY  08/05/2019   Procedure: POLYPECTOMY;  Surgeon: Carol Ada, MD;  [facility];  Service: Endoscopy;;  . PORTACATH PLACEMENT     06-29-15 inserted, now removed.  . SPLENECTOMY, TOTAL  1981  . STONE EXTRACTION WITH BASKET Right 10/12/2015   Procedure: STONE EXTRACTION WITH BASKET;  Surgeon: Cleon Gustin, MD;  [facility];  Service: Urology;   Laterality: Right;  . SUBMUCOSAL INJECTION  07/02/2018   Procedure: SUBMUCOSAL INJECTION;  Surgeon: Carol Ada, MD;  [facility];  Service: Endoscopy;;  . TONSILLECTOMY    . VARICELE REPAIR  40 YRS AGO    SOCIAL HISTORY: Social History   Socioeconomic History  . Marital status: Married    Spouse name: Not on file  . Number of children: Not on file  . Years of education: Not on file  . Highest education level: Not on file  Occupational History  . Occupation: disabled  Tobacco Use  . Smoking status: Never Smoker  . Smokeless tobacco: Never Used  Substance and Sexual Activity  . Alcohol use: No  . Drug use: No  . Sexual activity: Not on file  Other Topics Concern  . Not on file  Social History Narrative  . Not on file   Social Determinants of Health   Financial Resource Strain:   . Difficulty of Paying Living Expenses: Not on file  Food Insecurity:   . Worried About Charity fundraiser in the Last Year: Not on file  . Ran Out of Food in the Last Year: Not on file  Transportation Needs:   . Lack of Transportation (Medical): Not on file  . Lack of Transportation (Non-Medical): Not on file  Physical Activity:   . Days of Exercise per Week: Not on file  . Minutes of Exercise per Session: Not on file  Stress:   . Feeling of Stress : Not on file  Social Connections:   . Frequency of Communication with Friends and Family: Not on file  . Frequency of Social Gatherings with Friends and Family: Not on file  . Attends Religious Services: Not on file  . Active Member of Clubs or Organizations: Not on file  . Attends Archivist Meetings: Not on file  . Marital Status: Not on file  Intimate Partner Violence:   . Fear of Current or Ex-Partner: Not on file  . Emotionally Abused: Not on file  . Physically Abused: Not on file  . Sexually Abused: Not on file    FAMILY HISTORY: Family History  Problem Relation Age of Onset  . Hyperthyroidism Mother   .  Insomnia Mother   . Hypertension Father   . Depression Father   . Insomnia Father     ALLERGIES:  is allergic to digoxin; phencyclidine; spironolactone; lactose intolerance (gi); penicillins; and sulfur.  MEDICATIONS:  Current Outpatient Medications  Medication Sig Dispense  Refill  . acetaminophen (TYLENOL) 650 MG CR tablet Take 1,300 mg by mouth at bedtime as needed for pain.     Marland Kitchen allopurinol (ZYLOPRIM) 100 MG tablet Take 200 mg by mouth daily.   10  . aspirin EC 81 MG tablet Take 81 mg by mouth daily.     . Calcium Carbonate-Vitamin D3 (RA CALCIUM 600/VITAMIN D-3) 600-400 MG-UNIT TABS Take 1 tablet by mouth 2 (two) times a day.     . cetirizine (ZYRTEC) 10 MG tablet Take 10 mg by mouth at bedtime.     Marland Kitchen doxycycline (VIBRAMYCIN) 100 MG capsule Take 100 mg by mouth 2 (two) times daily.    Marland Kitchen ENVARSUS XR 1 MG TB24 Take 2 mg by mouth daily.     . fluticasone (FLONASE) 50 MCG/ACT nasal spray Place 1 spray into both nostrils at bedtime.     Marland Kitchen levothyroxine (SYNTHROID, LEVOTHROID) 125 MCG tablet Take 125 mcg by mouth daily.     . Mycophenolate Sodium (MYCOPHENOLIC ACID PO) Take 536 mg by mouth 2 (two) times a day.     . NON FORMULARY Apply 1 application topically daily. [facility] Anti-fungal (Nail)  (3) Refills *added the urea 40% to the anti-fungal (nail) -#1    . NON FORMULARY [employer]  Anti-fungal (nail)-#1    . predniSONE (DELTASONE) 2.5 MG tablet Take 1 tablet (2.5 mg total) by mouth daily. (Patient taking differently: Take 2.5 mg by mouth daily. ) 20 tablet 0  . RABEprazole (ACIPHEX) 20 MG tablet Take 1 tablet (20 mg total) by mouth 2 (two) times daily. (Patient taking differently: Take 20 mg by mouth 2 (two) times a day. ) 180 tablet 3  . trimethoprim (TRIMPEX) 100 MG tablet Take 100 mg by mouth daily.    . valACYclovir (VALTREX) 500 MG tablet Take 500 mg by mouth 2 (two) times daily as needed (for outbreak).      No current facility-administered medications  for this visit.    REVIEW OF SYSTEMS:    A 10+ POINT REVIEW OF SYSTEMS WAS OBTAINED including neurology, dermatology, psychiatry, cardiac, respiratory, lymph, extremities, GI, GU, Musculoskeletal, constitutional, breasts, reproductive, HEENT.  All pertinent positives are noted in the HPI.  All others are negative. Marland Kitchen   PHYSICAL EXAMINATION:  Telehealth visit   LABORATORY DATA:  I have reviewed the data as listed  . CBC Latest Ref Rng & Units 01/26/2020 09/13/2019 07/27/2019  WBC 4.0 - 10.5 K/uL 11.5(H) 11.1(H) -  Hemoglobin 13.0 - 17.0 g/dL 16.2 14.0 -  Hematocrit 39.0 - 52.0 % 49.7 45.1 39.6  Platelets 150 - 400 K/uL 340 410(H) -    . CMP Latest Ref Rng & Units 01/26/2020 09/13/2019 07/07/2019  Glucose 70 - 99 mg/dL 101(H) 104(H) 96  BUN 6 - 20 mg/dL 24(H) 24(H) 24(H)  Creatinine 0.61 - 1.24 mg/dL 1.41(H) 1.26(H) 1.23  Sodium 135 - 145 mmol/L 139 139 138  Potassium 3.5 - 5.1 mmol/L 4.4 3.8 3.9  Chloride 98 - 111 mmol/L 105 102 103  CO2 22 - 32 mmol/L 24 27 23   Calcium 8.9 - 10.3 mg/dL 9.3 9.4 9.2  Total Protein 6.5 - 8.1 g/dL 6.9 6.5 6.8  Total Bilirubin 0.3 - 1.2 mg/dL 0.6 0.5 0.4  Alkaline Phos 38 - 126 U/L 90 82 95  AST 15 - 41 U/L 17 14(L) 14(L)  ALT 0 - 44 U/L 20 13 12    . Lab Results  Component Value Date   IRON 105 01/26/2020  TIBC 284 01/26/2020   IRONPCTSAT 37 01/26/2020   (Iron and TIBC)  Lab Results  Component Value Date   FERRITIN 212 01/26/2020    05/20/2019 Surgical Pathology:   RADIOGRAPHIC STUDIES: I have personally reviewed the radiological images as listed and agreed with the findings in the report. No results found.  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 Lab Results  Component Value Date   IRON 105 01/26/2020   TIBC 284 01/26/2020   IRONPCTSAT 37 01/26/2020   (Iron and TIBC)  Lab Results  Component Value Date   FERRITIN 212 01/26/2020    PLAN: -Discussed (01/26/20) of CBC w/diff and CMP is as follows: all values are WNL except for WBC Count at 11.K, RDW at 16.4, Neutro Abs at 9.0K, Monocytes Absolute at 1.1K, Glucose at 101, BUN at 24, Creatinine at 1.41, GFR, Est Non Af Am at 55. -Platelets normal -Discussed (01/26/2020) of Ferritin is WNL goal is ferritin >100 -Discussed (01/26/2020) of Iron and TIBC is WNL  -Advised on seeing endocrinologist - f/u with PCP  -Will see back in 6 months  FOLLOW UP: RTC with Dr Irene Limbo with labs in 6 months   Orders Placed This Encounter  Procedures  . CBC with Differential/Platelet    Standing Status:   Future    Standing Expiration Date:   03/08/2021  . CMP ([place] only)    Standing Status:   Future    Standing Expiration Date:   02/01/2021  . Ferritin    Standing Status:   Future    Standing Expiration Date:   02/01/2021  . Iron and TIBC    Standing Status:   Future    Standing Expiration Date:   02/01/2021   The total time spent in the appt was 15 minutes and more than 50% was on counseling and direct patient cares.  All of the patient's questions were answered with apparent satisfaction. The patient knows to call the clinic with any problems, questions or concerns.Sullivan Lone MD [place] [facility] Hematology/Oncology Physician [facility]  (Office):       [phone] (Work cell):  [phone] (Fax):           [phone]  02/01/2020 11:30 AM  I, Dawayne Cirri am acting as a Education administrator for Dr. Sullivan Lone.   .I have reviewed the above documentation for accuracy and completeness, and I agree with the above. [name] MD

## 2020-02-02 ENCOUNTER — Other Ambulatory Visit: Payer: Self-pay

## 2020-02-02 ENCOUNTER — Inpatient Hospital Stay: Payer: 59 | Attending: Hematology | Admitting: Hematology

## 2020-02-02 DIAGNOSIS — Z8571 Personal history of Hodgkin lymphoma: Secondary | ICD-10-CM | POA: Diagnosis not present

## 2020-02-02 DIAGNOSIS — D500 Iron deficiency anemia secondary to blood loss (chronic): Secondary | ICD-10-CM

## 2020-02-02 DIAGNOSIS — D473 Essential (hemorrhagic) thrombocythemia: Secondary | ICD-10-CM | POA: Diagnosis not present

## 2020-02-02 DIAGNOSIS — Z85820 Personal history of malignant melanoma of skin: Secondary | ICD-10-CM

## 2020-03-13 ENCOUNTER — Inpatient Hospital Stay: Payer: 59

## 2020-03-13 ENCOUNTER — Inpatient Hospital Stay: Payer: 59 | Admitting: Hematology

## 2020-03-23 ENCOUNTER — Other Ambulatory Visit: Payer: Self-pay | Admitting: Gastroenterology

## 2020-04-17 ENCOUNTER — Other Ambulatory Visit (HOSPITAL_COMMUNITY)
Admission: RE | Admit: 2020-04-17 | Discharge: 2020-04-17 | Disposition: A | Discharge status: Home / Self Care | Payer: 59 | Source: Ambulatory Visit | Attending: Gastroenterology | Admitting: Gastroenterology

## 2020-04-17 DIAGNOSIS — Z01812 Encounter for preprocedural laboratory examination: Secondary | ICD-10-CM | POA: Diagnosis present

## 2020-04-17 DIAGNOSIS — Z20822 Contact with and (suspected) exposure to covid-19: Secondary | ICD-10-CM | POA: Diagnosis not present

## 2020-04-17 LAB — SARS CORONAVIRUS 2 (TAT 6-24 HRS): SARS Coronavirus 2: NEGATIVE

## 2020-04-19 ENCOUNTER — Encounter (HOSPITAL_COMMUNITY): Payer: Self-pay | Admitting: Certified Registered"

## 2020-04-20 ENCOUNTER — Ambulatory Visit (HOSPITAL_COMMUNITY)
Admission: RE | Admit: 2020-04-20 | Discharge: 2020-04-20 | Disposition: A | Discharge status: Home / Self Care | Payer: 59 | Attending: Gastroenterology | Admitting: Gastroenterology

## 2020-04-20 ENCOUNTER — Encounter (HOSPITAL_COMMUNITY): Payer: Self-pay | Admitting: Gastroenterology

## 2020-04-20 ENCOUNTER — Other Ambulatory Visit: Payer: Self-pay

## 2020-04-20 ENCOUNTER — Encounter (HOSPITAL_COMMUNITY)
Admission: RE | Disposition: A | Discharge status: Home / Self Care | Payer: Self-pay | Source: Home / Self Care | Attending: Gastroenterology

## 2020-04-20 DIAGNOSIS — Z8571 Personal history of Hodgkin lymphoma: Secondary | ICD-10-CM | POA: Insufficient documentation

## 2020-04-20 DIAGNOSIS — D128 Benign neoplasm of rectum: Secondary | ICD-10-CM | POA: Insufficient documentation

## 2020-04-20 DIAGNOSIS — Z941 Heart transplant status: Secondary | ICD-10-CM | POA: Insufficient documentation

## 2020-04-20 DIAGNOSIS — Z85828 Personal history of other malignant neoplasm of skin: Secondary | ICD-10-CM | POA: Insufficient documentation

## 2020-04-20 DIAGNOSIS — Z1211 Encounter for screening for malignant neoplasm of colon: Secondary | ICD-10-CM | POA: Insufficient documentation

## 2020-04-20 DIAGNOSIS — Z85048 Personal history of other malignant neoplasm of rectum, rectosigmoid junction, and anus: Secondary | ICD-10-CM | POA: Insufficient documentation

## 2020-04-20 DIAGNOSIS — Z923 Personal history of irradiation: Secondary | ICD-10-CM | POA: Insufficient documentation

## 2020-04-20 DIAGNOSIS — Z9221 Personal history of antineoplastic chemotherapy: Secondary | ICD-10-CM | POA: Diagnosis not present

## 2020-04-20 DIAGNOSIS — N189 Chronic kidney disease, unspecified: Secondary | ICD-10-CM | POA: Insufficient documentation

## 2020-04-20 HISTORY — PX: FLEXIBLE SIGMOIDOSCOPY: SHX5431

## 2020-04-20 HISTORY — PX: POLYPECTOMY: SHX5525

## 2020-04-20 SURGERY — SIGMOIDOSCOPY, FLEXIBLE
Anesthesia: Moderate Sedation

## 2020-04-20 MED ORDER — SODIUM CHLORIDE 0.9 % IV SOLN: INTRAVENOUS | Status: DC

## 2020-04-20 NOTE — Op Note (Signed)
[facility] Patient Name: Glenn Gonzalez Procedure Date: 04/20/2020 MRN: [id number] Attending MD: Carol Ada , MD Date of Birth: 12-Jun-1961 CSN: [id number] Age: 59 Admit Type: Outpatient Procedure:                Flexible Sigmoidoscopy Indications:              High risk colon cancer surveillance: Personal                            history of colon cancer Providers:                Carol Ada, MD, Benetta Spar RN, RN, Laverda Sorenson, Technician Referring MD:              Medicines:                None Complications:            No immediate complications. Estimated Blood Loss:     Estimated blood loss: none. Procedure:                Pre-Anesthesia Assessment:                           - Prior to the procedure, a History and Physical                            was performed, and patient medications and                            allergies were reviewed. The patient's tolerance of                            previous anesthesia was also reviewed. The risks                            and benefits of the procedure and the sedation                            options and risks were discussed with the patient.                            All questions were answered, and informed consent                            was obtained. Prior Anticoagulants: The patient has                            taken no previous anticoagulant or antiplatelet                            agents. ASA Grade Assessment: III - A patient with                            severe systemic  disease. After reviewing the risks                            and benefits, the patient was deemed in                            satisfactory condition to undergo the procedure.                           After obtaining informed consent, the scope was                            passed under direct vision. The GIF-H190 ([id number])                            Olympus gastroscope was introduced  through the anus                            and advanced to the the sigmoid colon. The flexible                            sigmoidoscopy was accomplished without difficulty.                            The patient tolerated the procedure well. The                            quality of the bowel preparation was excellent. The                            flexible sigmoidoscopy was accomplished without                            difficulty. Scope In: 10:32:14 AM Scope Out: 10:40:42 AM Total Procedure Duration: 0 hours 8 minutes 28 seconds  Findings:      Seven sessile polyps were found in the rectum. The polyps were 2 to 3 mm       in size. These polyps were removed with a cold snare. Resection and       retrieval were complete. Impression:               - Seven 2 to 3 mm polyps in the rectum, removed                            with a cold snare. Resected and retrieved. Moderate Sedation:      Not Applicable - Patient had care per Anesthesia. Recommendation:           - Patient has a contact number available for                            emergencies. The signs and symptoms of potential                            delayed complications were discussed with the  patient. Return to normal activities tomorrow.                            Written discharge instructions were provided to the                            patient.                           - Resume regular diet.                           - Await pathology results.                           - Colonoscopy in September 2021. Procedure Code(s):        --- Professional ---                           [phone], Sigmoidoscopy, flexible; with removal of                            tumor(s), polyp(s), or other lesion(s) by snare                            technique Diagnosis Code(s):        --- Professional ---                           K62.1, Rectal polyp                           Z85.038, Personal history of other  malignant                            neoplasm of large intestine CPT copyright 2019 American Medical Association. All rights reserved. The codes documented in this report are preliminary and upon coder review may  be revised to meet current compliance requirements. Carol Ada, MD Carol Ada, MD 04/20/2020 10:48:28 AM This report has been signed electronically. Number of Addenda: 0

## 2020-04-20 NOTE — Discharge Instructions (Signed)

## 2020-04-20 NOTE — H&P (Signed)
Glenn Gonzalez HPI: The patient is here for follow up of his personal history of early rectal cancer. The last FFS was negative for any recurrence.   Past Medical History:  Diagnosis Date  . A-fib (HCC)    IN PAST  . Bulging lumbar disc    states " i threw my back out on tuesday 6-16, i ahve a bulging disc, but i can lie on my back a side "   . Chronic fatigue   . Chronic kidney disease    CREATININE NOW 1.4   . Colon cancer ([place]) DX 2019   SURGERY DONE  . Complication of anesthesia    AFTER HEART TRANSPLANT TOOK 2 YEARS TO GET BACK TO SELF; patient does not want aneshesia for endoscopy procedures   . Depression    SITUATIONAL  . GERD (gastroesophageal reflux disease)   . History of chemotherapy    DAMAGED HEART MUSCLE WITH RADIATION ALSO  . History of kidney stones   . History of radiation therapy   . Hodgkin's lymphoma ([place]) 1980   IIIB. 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  . Hypothyroidism   . Pneumonia FROM CHF 2016   recurrent pneumonia sedf.  rad tx for lymphoma  . Shortness of breath dyspnea    with exertion  . Skin cancer    AREAS REMOVED FROM [facility], CHECK AND BACK AND HEAD SEPT 2019  . Status post heart transplantation ([place]) 2017    Past Surgical History:  Procedure Laterality Date  . BIOPSY  07/02/2018   Procedure: BIOPSY;  Surgeon: Carol Ada, MD;  Location: [facility];  Service: Endoscopy;;  . BIOPSY  05/20/2019   Procedure: BIOPSY;  Surgeon: Carol Ada, MD;  Location: [facility];  Service: Endoscopy;;  . CARDIAC CATHETERIZATION N/A 04/20/2015   Procedure: Right/Left Heart Cath and Coronary Angiography;  Surgeon: Jolaine Artist, MD;  Location: [place] [facility];  Service: Cardiovascular;  Laterality: N/A;  . CARDIAC CATHETERIZATION  10-11-15   pt states routine heart cath to be done at [facility]   . CARDIAC CATHETERIZATION N/A 04/02/2016   Procedure: Right Heart Cath;  Surgeon: Jolaine Artist, MD;  Location: [place] [facility];   Service: Cardiovascular;  Laterality: N/A;  . CARDIAC CATHETERIZATION  JULY 2019 AT [facility]  . COLONOSCOPY N/A 07/02/2018   Procedure: COLONOSCOPY;  Surgeon: Carol Ada, MD;  [facility];  Service: Endoscopy;  Laterality: N/A;  . COLONOSCOPY N/A 08/05/2019   Procedure: COLONOSCOPY;  Surgeon: Carol Ada, MD;  [facility]: [facility];  Service: Endoscopy;  Laterality: N/A;  . COLONOSCOPY WITH PROPOFOL N/A 08/03/2015   Procedure: COLONOSCOPY WITH PROPOFOL;  Surgeon: Carol Ada, MD;  [facility]: [facility];  Service: Endoscopy;  Laterality: N/A;  wants to try to do procedure without sedation  . CYSTOSCOPY WITH INSERTION OF UROLIFT N/A 10/18/2018   Procedure: CYSTOSCOPY WITH INSERTION OF UROLIFT;  Surgeon: Cleon Gustin, MD;  Location: [facility];  Service: Urology;  Laterality: N/A;  30 MINS  . CYSTOSCOPY WITH RETROGRADE PYELOGRAM, URETEROSCOPY AND STENT PLACEMENT Right 10/12/2015   Procedure: CYSTOSCOPY WITH RETROGRADE PYELOGRAM, URETEROSCOPY AND STENT PLACEMENT;  Surgeon: Cleon Gustin, MD;  Location: [facility];  Service: Urology;  Laterality: Right;  . ESOPHAGOGASTRODUODENOSCOPY    . ESOPHAGOGASTRODUODENOSCOPY (EGD) WITH PROPOFOL N/A 09/29/2019   Procedure: ESOPHAGOGASTRODUODENOSCOPY (EGD) WITH PROPOFOL;  Surgeon: Carol Ada, MD;  [facility];  Service: Endoscopy;  Laterality: N/A;  . EXPLORATORY LAPAROTOMY    .  FLEXIBLE SIGMOIDOSCOPY N/A 05/20/2019   Procedure: FLEXIBLE SIGMOIDOSCOPY;  Surgeon: Carol Ada, MD;  Location: [facility];  Service: Endoscopy;  Laterality: N/A;  . gynemastia Bilateral   . HEART TRANSPLANT  06/20/2016   at [facility]  . HOT HEMOSTASIS N/A 07/02/2018   Procedure: HOT HEMOSTASIS (ARGON PLASMA COAGULATION/BICAP);  Surgeon: Carol Ada, MD;  Location: [facility];  Service: Endoscopy;  Laterality: N/A;  . LUMBAR LAMINECTOMY/DECOMPRESSION MICRODISCECTOMY Right 01/04/2016   Procedure: Microdiscectomy Lumbar four Lumbar five  right;  Surgeon: Eustace Moore, MD;  Location: [place] [facility];  Service: Neurosurgery;  Laterality: Right;  . neck biopses     x5  . 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 POLYPECTOMY  07/02/2018   Procedure: POLYPECTOMY;  Surgeon: Carol Ada, MD;  [facility];  Service: Endoscopy;;  . POLYPECTOMY  08/05/2019   Procedure: POLYPECTOMY;  Surgeon: Carol Ada, MD;  [facility];  Service: Endoscopy;;  . PORTACATH PLACEMENT     06-29-15 inserted, now removed.  . SPLENECTOMY, TOTAL  1981  . STONE EXTRACTION WITH BASKET Right 10/12/2015   Procedure: STONE EXTRACTION WITH BASKET;  Surgeon: Cleon Gustin, MD;  [facility]: [facility];  Service: Urology;  Laterality: Right;  . SUBMUCOSAL INJECTION  07/02/2018   Procedure: SUBMUCOSAL INJECTION;  Surgeon: Carol Ada, MD;  [facility];  Service: Endoscopy;;  . TONSILLECTOMY    . VARICELE REPAIR  40 YRS AGO    Family History  Problem Relation Age of Onset  . Hyperthyroidism Mother   . Insomnia Mother   . Hypertension Father   . Depression Father   . Insomnia Father     Social History:  reports that he has never smoked. He has never used smokeless tobacco. He reports that he does not drink alcohol or use drugs.  Allergies:  Allergies  Allergen Reactions  . Digoxin Other (See Comments)    gynecomastia   . Phencyclidine Other (See Comments)    PCP derived antibiotic > Insomnia  . Spironolactone Other (See Comments)    Gynecomastia  . Lactose Intolerance (Gi) Diarrhea and Other (See Comments)    cramps  . Penicillins Other (See Comments)    Cramps Did it involve swelling of the face/tongue/throat, SOB, or low BP? No Did it involve sudden or severe rash/hives, skin peeling, or any reaction on the inside of your mouth or nose? No Did you need to seek medical attention at a hospital or doctor's office? No When did it last happen?unknown If all above answers are  "NO", may proceed with cephalosporin use.     . Sulfur Rash    Medications:  Scheduled:  Continuous: . sodium chloride      No results found for this or any previous visit (from the past 24 hour(s)).   No results found.  ROS:  As stated above in the HPI otherwise negative.  Blood pressure (!) 145/84, pulse 85, temperature 97.7 F (36.5 C), temperature source Oral, resp. rate 12, height 6\' 2"  (1.88 m), weight 99.8 kg, SpO2 100 %.    PE: Gen: NAD, Alert and Oriented HEENT:  [place]/AT, EOMI Neck: Supple, no LAD Lungs: CTA Bilaterally CV: RRR without M/G/R ABD: Soft, NTND, +BS Ext: No C/C/E  Assessment/Plan: 1) Personal history of rectal cancer - FFS.  Elayna Tobler D 04/20/2020, 10:18 AM

## 2020-04-23 ENCOUNTER — Encounter: Payer: Self-pay | Admitting: *Deleted

## 2020-04-23 LAB — SURGICAL PATHOLOGY

## 2020-04-29 ENCOUNTER — Other Ambulatory Visit: Payer: Self-pay | Admitting: Urology

## 2020-06-06 ENCOUNTER — Telehealth (HOSPITAL_COMMUNITY): Payer: Self-pay | Admitting: *Deleted

## 2020-06-06 DIAGNOSIS — Z941 Heart transplant status: Secondary | ICD-10-CM

## 2020-06-06 NOTE — Telephone Encounter (Signed)
[place] with [facility] called to see if we could share care. Pt needs labs and an echo in 6 months. Per Adline Potter Dr.Bensimhon can share care with [facility] but we cant not biopsy. Per Tia pt is 3-4 years out from transplant and will not needs a biopsy she will fax over specific lab orders. Pt placed on recall and echo ordered.

## 2020-06-15 ENCOUNTER — Other Ambulatory Visit (HOSPITAL_COMMUNITY)
Admission: RE | Admit: 2020-06-15 | Discharge: 2020-06-15 | Disposition: A | Discharge status: Home / Self Care | Payer: 59 | Source: Ambulatory Visit | Attending: Cardiology | Admitting: Cardiology

## 2020-06-15 DIAGNOSIS — Z941 Heart transplant status: Secondary | ICD-10-CM | POA: Insufficient documentation

## 2020-06-15 LAB — BASIC METABOLIC PANEL
Anion gap: 9 (ref 5–15)
BUN: 17 mg/dL (ref 6–20)
CO2: 26 mmol/L (ref 22–32)
Calcium: 8.7 mg/dL — ABNORMAL LOW (ref 8.9–10.3)
Chloride: 103 mmol/L (ref 98–111)
Creatinine, Ser: 1.23 mg/dL (ref 0.61–1.24)
GFR calc Af Amer: >60 mL/min (ref >60)
GFR calc non Af Amer: >60 mL/min (ref >60)
Glucose, Bld: 88 mg/dL (ref 70–99)
Potassium: 3.8 mmol/L (ref 3.5–5.1)
Sodium: 138 mmol/L (ref 135–145)

## 2020-06-17 LAB — SIROLIMUS LEVEL: Sirolimus (Rapamycin): 3.7 ng/mL (ref 3.0–20.0)

## 2020-06-17 LAB — TACROLIMUS LEVEL: Tacrolimus (FK506) - LabCorp: 7.8 ng/mL (ref 2.0–20.0)

## 2020-07-06 ENCOUNTER — Telehealth: Payer: Self-pay | Admitting: Hematology

## 2020-07-06 NOTE — Telephone Encounter (Signed)
Rescheduled 09/03 to 10/01 due to provider pal, patient has been called and notified.

## 2020-07-10 ENCOUNTER — Ambulatory Visit
Admission: RE | Admit: 2020-07-10 | Discharge: 2020-07-10 | Disposition: A | Discharge status: Home / Self Care | Payer: 59 | Source: Ambulatory Visit | Attending: Physician Assistant | Admitting: Physician Assistant

## 2020-07-10 ENCOUNTER — Other Ambulatory Visit: Payer: Self-pay | Admitting: Physician Assistant

## 2020-07-10 DIAGNOSIS — R0789 Other chest pain: Secondary | ICD-10-CM

## 2020-07-24 ENCOUNTER — Other Ambulatory Visit: Payer: Self-pay | Admitting: Urology

## 2020-07-27 ENCOUNTER — Other Ambulatory Visit: Payer: Self-pay

## 2020-07-27 ENCOUNTER — Other Ambulatory Visit (HOSPITAL_COMMUNITY)
Admission: RE | Admit: 2020-07-27 | Discharge: 2020-07-27 | Disposition: A | Discharge status: Home / Self Care | Payer: 59 | Source: Ambulatory Visit | Attending: Cardiology | Admitting: Cardiology

## 2020-07-27 ENCOUNTER — Other Ambulatory Visit (HOSPITAL_COMMUNITY)
Admission: RE | Admit: 2020-07-27 | Discharge: 2020-07-27 | Disposition: A | Discharge status: Home / Self Care | Payer: 59 | Source: Ambulatory Visit | Attending: Urology | Admitting: Urology

## 2020-07-27 ENCOUNTER — Encounter (HOSPITAL_BASED_OUTPATIENT_CLINIC_OR_DEPARTMENT_OTHER): Payer: Self-pay | Admitting: Urology

## 2020-07-27 DIAGNOSIS — Z01812 Encounter for preprocedural laboratory examination: Secondary | ICD-10-CM | POA: Insufficient documentation

## 2020-07-27 DIAGNOSIS — Z20822 Contact with and (suspected) exposure to covid-19: Secondary | ICD-10-CM | POA: Diagnosis not present

## 2020-07-27 LAB — BASIC METABOLIC PANEL
Anion gap: 10 (ref 5–15)
BUN: 19 mg/dL (ref 6–20)
CO2: 27 mmol/L (ref 22–32)
Calcium: 8.9 mg/dL (ref 8.9–10.3)
Chloride: 100 mmol/L (ref 98–111)
Creatinine, Ser: 1.52 mg/dL — ABNORMAL HIGH (ref 0.61–1.24)
GFR calc Af Amer: 58 mL/min — ABNORMAL LOW (ref >60)
GFR calc non Af Amer: 50 mL/min — ABNORMAL LOW (ref >60)
Glucose, Bld: 93 mg/dL (ref 70–99)
Potassium: 4.1 mmol/L (ref 3.5–5.1)
Sodium: 137 mmol/L (ref 135–145)

## 2020-07-27 LAB — SARS CORONAVIRUS 2 (TAT 6-24 HRS): SARS Coronavirus 2: NEGATIVE

## 2020-07-27 NOTE — Progress Notes (Addendum)
Glenn Felix pa spoke with Dr Smith Robert on 07-26-2020, pt ok for [facility]  Patient wishes to speak to anesthelogist day of surgery and likes propofol but wishes to have as few of anesthesoa drugs as possible   Spoke w/ via phone for pre-op interview---PT Lab needs dos----  ekg             Lab results------bmet 07-27-2020 epic COVID test ------07-27-2020 1000 am Arrive at -------830 am 07-31-2020 NPO after MN NO Solid Food.  Clear liquids from MN until---730 am then npo Medications to take morning of surgery -----envarsus, sirolimus, prednisone, trimethoprim, levothyroxine, levothyroxine, aciphex Diabetic medication -----n/a Patient Special Instructions -----none Pre-Op special Istructions -----none Patient verbalized understanding of instructions that were given at this phone interview. Patient denies shortness of breath, chest pain, fever, cough at this phone interview.  Anesthesia Review: heart tranplant 2017 at [place], hx of hodgkin's lymphoma, pt denies any cardiac s & s or sob at pre op phone call  PCP: dr Christiane Ha stoneking Cardiologist  For heart transplant dr Melvyn Novas lov 06-01-2020 care everywhere/chart Chest x-ray :07-10-2020 epic/chart EKG :none recent Ct heart angiogram 06-01-2020 care everywhere/chart Echo :06-01-2020 care everywhere/chart Stress test:none lov dr Irene Limbo oncology 02-02-2020 epic Cardiac Cath : 06-23-2018 last done care everywhere Activity level: walks around house, can climb steps without problems Sleep Study/ CPAP :none Fasting Blood Sugar :      / Checks Blood Sugar -- times a day:  n/a Blood Thinner/ Instructions /Last Dose:n/a ASA / Instructions/ Last Dose : 81 mg aspirin last dose 07-26-2020 pt instructed by dr Milford Cage to stop 81 mg aspirin 5 days before surgery

## 2020-07-30 ENCOUNTER — Other Ambulatory Visit: Payer: Self-pay | Admitting: Urology

## 2020-07-30 LAB — SIROLIMUS LEVEL: Sirolimus (Rapamycin): 4.7 ng/mL (ref 3.0–20.0)

## 2020-07-30 NOTE — H&P (Signed)
have an enlarged prostate (S/P Surgery).  HPI: Glenn Gonzalez is a 59 year-old male established patient who is here for an enlarged prostate after surgical intervention.   It was performed 10/18/2018. He has not been treated with medications in the past for his lower urinary tract symptoms. He is on new medications for symptoms of prostate enlargement.   He does have an abnormal sensation when needing to urinate. He does have a good size and strength to his urinary stream. He does not have hesitancy or straining. He is not having problems with emptying his bladder well.   10/20/18: He is s/p Urolift procedure for BPH refractory to pharmacological therapy. He returns today for voiding trial. Overall, he states that he has been doing fairly well following his procedure. He states that his catheter has been draining well. He did have some painful urgency and spasm, which he used Myrbetriq for. This was helpful. His last dose was yesterday. He did have some hematuria initially, but this has cleared. He denies fevers, chills, nausea, vomiting, or constipation.   11/03/18: He returns today for follow up. Overall the states that he has been doing well since his procedure. He states that for the first few days he noted dramatic improvement in lower urinary tract symptoms. Today he continues to note improvement, thought not as much. He currently denies bothersome frequency, urgency, or nocturia. He denies straining. He states that he will have some hesitancy at times, however. He states he notices this more if he attempts to void prior to feeling that his bladder is full. He is no longer on Uroxatral or Proscar. He continues to have some intermittent suprapubic discomfort. He denies dysuria, gross hematuria, fever, or chills.   12/16/2018: After urolift he has a strong stream. improved hesitancy, less straining.   06/08/19: Patient with above-noted history. He also has history of chronic prostatitis. He presents  today with complaints of bladder pain. He states that his symptoms began about 2-3 days ago. He describes the pain is intermittent and relatively mild, but noticeable. He has not used anything for the pain. He denies dysuria or pain associated with voiding. He denies any significant changes in his urinary stream in continues to feel it has improved following Urolift. He did have 1 episode of hesitancy and straining last night, but voiding symptoms are stable today. He denies exacerbation of urgency or frequency. No gross hematuria, fevers, or chills.   07/29/2019: He developed a prostate infection and was treated with 2 course of antibiotics. He LUTS have resolved. He has occasional dysuria.   09/15/2019: His LUTS have returned to baseline. He has occasional hesitancy which is rare. No dysuria.   02/02/2020: Stream is ok. nocturia 0-1x. mild hesitancy.    CC: I have a prostate infection.  HPI: He has been treated with Uroxatral and Proscar. Nothing seems to make the symptoms worse. He has had recurrent prostate infections or chronic prostatitis. He has been on antibiotics for prostate infections previously.   He does not dribble at the end of urination. Patient denies getting up to urinate in the night. He has not had a PSA done.   09/15/2019: The patient was previously on doxycycline for chronic prostatitis which improve his symptoms   02/02/2020: LUTS are at baseline. he is on trimethoprim 100mg  qhs and no recent UTIs.   -07/05/20-patient with history of previous prostatitis in the past. Patient has significant complicated medical history with history of Hodgkin's lymphoma in the remote past which required  chemotherapy. 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 Also has history of uric acid stones and takes allopurinol on a daily basis for prevention. He presents today with history of a single episode of gross painless hematuria proximally 1  week ago that happened during 1 void and then resolved. He has also had some intermittent right-sided "kidney pain" which is sort of stabbing in nature and intermittent. Denies any nausea vomiting or fever. Currently minimally symptomatic. He remains on trimethoprim suppression for chronic prostatitis as well. The patient is super anxious in terms of concern over possible cancer recurrence or new cancer.  -8/223/21-patient with history of gross hematuria as above. Underwent CT renal scan on 07/20/2020 showing essentially normal upper urinary tract except for small nonobstructing left renal calculus.. Here for cysto to assess bladder. CT report as below.   Cysto performed today and shows normal pendulous prostatic urethra. 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 Remainder of the bladder. Grossly normal. This area probably is a cm and half in size    CLINICAL DATA: Gross hematuria, history of non-Hodgkin's lymphoma,  status post splenectomy, history of colon cancer   EXAM:  CT ABDOMEN AND PELVIS WITHOUT AND WITH CONTRAST   TECHNIQUE:  Multidetector CT imaging of the abdomen and pelvis was performed  following the standard protocol before and following the bolus  administration of intravenous contrast.   CONTRAST: 100 mL Omnipaque 300 iodinated contrast IV   COMPARISON: 07/20/2018   FINDINGS:  Lower chest: No acute abnormality.   Hepatobiliary: No solid liver abnormality is seen. No gallstones,  gallbladder wall thickening, or biliary dilatation.   Pancreas: Surgical clips in the vicinity of the pancreatic head  (series 5, image 22). No pancreatic ductal dilatation or surrounding  inflammatory changes.   Spleen: Status post splenectomy.   Adrenals/Urinary Tract: Adrenal glands are unremarkable. Unchanged  cortical scarring of the superior pole of the left kidney (series  601, image  89). Small nonobstructive calculus of the inferior pole  of the left kidney (series 601, image 84). No ureteral calculus or  hydronephrosis. Bladder is unremarkable.   Stomach/Bowel: Stomach is within normal limits. Appendix appears  normal. No evidence of bowel wall thickening, distention, or  inflammatory changes.   Vascular/Lymphatic: Aortic atherosclerosis. No enlarged abdominal or  pelvic lymph nodes. Surgical clips in the left retroperitoneum  (series 8, image 43).   Reproductive: Metallic clips in the prostate (series 2, image 93).   Other: No abdominal wall hernia or abnormality. No abdominopelvic  ascites.   Musculoskeletal: No acute or significant osseous findings.   IMPRESSION:  1. Small nonobstructive calculus of the inferior pole of the left  kidney. No ureteral calculus or hydronephrosis.  2. Unchanged cortical scarring of the superior pole of the left  kidney.  3. No suspicious mass or filling defect of the urinary tract.  4. Status post splenectomy and lymph node dissection.  5. No evidence of mass or lymphadenopathy in the abdomen or pelvis.  6. Aortic Atherosclerosis (ICD10-I70.0).    Electronically Signed  By: Eddie Candle M.D.  On: 07/20/2020 19:25     ALLERGIES: Lactose Penicillins Sulfa    MEDICATIONS: Allopurinol  Aciphex 20 mg tablet, delayed release  Aspir 81 81 mg tablet, delayed release  Calcium + D  Envarsus Xr  Prednisone 2.5 mg tablet  Sirolimus 1 mg tablet  Synthroid  Trimethoprim 100 mg tablet 1 tablet PO Daily  Zyrtec 10 mg tablet Oral     GU PSH: Cystoscopy Insert Stent - 2016 Locm 300-399Mg /Ml Iodine,1Ml - 07/20/2020 Ureteroscopic stone removal - 2016 UROLIFT - 10/18/2018       PSH Notes: Cystoscopy With Ureteroscopy With Removal Of Calculus, Cystoscopy With Insertion Of Ureteral Stent Right, Cholecystectomy, Exploratory Laparotomy, Neck Surgery, Breast Surgery Mastectomy For Gynecomastia, Tonsillectomy, Pacemaker - Pulse  Generator Replacement, Cath Stent Placement, Percutaneous Portal Vein Catheter Placement  Colon cancer removed July of 2019.   NON-GU PSH: Cholecystectomy (open) - 2016 Exploratory Laparotomy - 2016 Insert Catheter; Vein - 2016 REMOVAL OF BREAST TISSUE - 2016 Remove Tonsils - 2016 Transplant Heart - 2017     GU PMH: History of urolithiasis - 07/05/2020 BPH w/LUTS - 02/02/2020, - 09/15/2019, - 07/29/2019, - 06/08/2019, - 2020, - 11/03/2018, - 10/20/2018, - 08/12/2018, - 2019, - 2019, - 2019, - 2018 Chronic prostatitis - 02/02/2020, - 09/15/2019, - 07/29/2019, - 06/08/2019 Elevated PSA - 09/15/2019, - 2018 Weak Urinary Stream - 08/12/2018, - 2019 Gross hematuria - 2019 Renal calculus, Nephrolithiasis - 2016 Urinary Calculus, Unspec, Uric acid urolithiasis - 2016      PMH Notes: Abnormally Low RBC Count   NON-GU PMH: Encounter for general adult medical examination without abnormal findings, Encounter for preventive health examination - 2016 Personal history of Hodgkin lymphoma, History of hodgkin's lymphoma - 2016 Personal history of other diseases of the circulatory system, History of heart failure - 2016 Personal history of other diseases of the digestive system, History of esophageal reflux - 2016, History of gastric ulcer, - 2016 Personal history of other endocrine, nutritional and metabolic disease, History of hypothyroidism - 2016 Colon Cancer, History Heart failure, unspecified Heart transplant status Hypothyroidism    FAMILY HISTORY: Death - Father Heart Disease - Father Kidney Stones - Runs In Family Lung Cancer - Father   SOCIAL HISTORY: Marital Status: Married Preferred Language: English; Ethnicity: Not Hispanic Or Latino; Race: White Current Smoking Status: Patient has never smoked.   Tobacco Use Assessment Completed: Used Tobacco in last 30 days? Has never drank.  Does not drink caffeine. Patient's occupation is/was retired.     Notes: Married, Caffeine use, Retired, Never  a smoker, Number of children, Alcohol use   REVIEW OF SYSTEMS:    GU Review Male:   Patient denies frequent urination, hard to postpone urination, burning/ pain with urination, get up at night to urinate, leakage of urine, stream starts and stops, trouble starting your stream, have to strain to urinate , erection problems, and penile pain.  Gastrointestinal (Upper):   Patient denies nausea, vomiting, and indigestion/ heartburn.  Gastrointestinal (Lower):   Patient denies diarrhea and constipation.  Constitutional:   Patient reports night sweats and fatigue. Patient denies fever and weight loss.  Skin:   Patient denies skin rash/ lesion and itching.  Eyes:   Patient denies blurred vision and double vision.  Ears/ Nose/ Throat:   Patient denies sore throat and sinus problems.  Hematologic/Lymphatic:   Patient denies swollen glands and easy bruising.  Cardiovascular:   Patient denies leg swelling and chest pains.  Respiratory:   Patient reports cough. Patient denies shortness of breath.  Endocrine:   Patient denies excessive thirst.  Musculoskeletal:   Patient reports back pain. Patient denies joint pain.  Neurological:   Patient denies headaches and dizziness.  Psychologic:   Patient denies depression and anxiety.   VITAL SIGNS:      07/23/2020 11:36 AM  Weight 222 lb / 100.7 kg  Height 74 in / 187.96 cm  BP 94/63 mmHg  Pulse 92 /min  Temperature 97.8 F / 36.5 C  BMI 28.5 kg/m   GU PHYSICAL EXAMINATION:    Anus and Perineum: No hemorrhoids. No anal stenosis. No rectal fissure, no anal fissure. No edema, no dimple, no perineal tenderness, no anal tenderness.  Scrotum: No lesions. No edema. No cysts. No warts.  Epididymides: Right: no spermatocele, no masses, no cysts, no tenderness, no induration, no enlargement. Left: no spermatocele, no masses, no cysts, no tenderness, no induration, no enlargement.  Testes: No tenderness, no swelling, no enlargement left testes. No tenderness, no  swelling, no enlargement right testes. Normal location left testes. Normal location right testes. No mass, no cyst, no varicocele, no hydrocele left testes. No mass, no cyst, no varicocele, no hydrocele right testes.  Urethral Meatus: Normal size. No lesion, no wart, no discharge, no polyp. Normal location.  Penis: Circumcised, no warts, no cracks. No dorsal Peyronie's plaques, no left corporal Peyronie's plaques, no right corporal Peyronie's plaques, no scarring, no warts. No balanitis, no meatal stenosis.  Prostate: 40 gram or 2+ size. Left lobe normal consistency, right lobe normal consistency. Symmetrical lobes. No prostate nodule. Left lobe no tenderness, right lobe no tenderness.  Seminal Vesicles: Nonpalpable.  Sphincter Tone: Normal sphincter. No rectal tenderness. No rectal mass.    MULTI-SYSTEM PHYSICAL EXAMINATION:    Constitutional: Well-nourished. No physical deformities. Normally developed. Good grooming.  Neck: Neck symmetrical, not swollen. Normal tracheal position.  Respiratory: No labored breathing, no use of accessory muscles.   Cardiovascular: Normal temperature, normal extremity pulses, no swelling, no varicosities.  Lymphatic: No enlargement of neck, axillae, groin.  Skin: No paleness, no jaundice, no cyanosis. No lesion, no ulcer, no rash.  Neurologic / Psychiatric: Oriented to time, oriented to place, oriented to person. No depression, no anxiety, no agitation.  Gastrointestinal: No mass, no tenderness, no rigidity, non obese abdomen.  Eyes: Normal conjunctivae. Normal eyelids.  Ears, Nose, Mouth, and Throat: Left ear no scars, no lesions, no masses. Right ear no scars, no lesions, no masses. Nose no scars, no lesions, no masses. Normal hearing. Normal lips.  Musculoskeletal: Normal gait and station of head and neck.     Complexity of Data:  Source Of History:  Patient  Records Review:   Previous Doctor Records, Previous Hospital Records  Urine Test Review:    Urinalysis  X-Ray Review: C.T. Abdomen/Pelvis: Reviewed Films. Reviewed Report. Discussed With Patient.     09/08/19  PSA  Total PSA 2.65 ng/mL    PROCEDURES:         Flexible Cystoscopy - 52000  Risks, benefits, and some of the potential complications of the procedure were discussed at length with the patient including infection, bleeding, voiding discomfort, urinary retention, fever, chills, sepsis, and others. All questions were answered. Informed consent was obtained. Antibiotic prophylaxis was given. Sterile technique and intraurethral analgesia were used.  Meatus:  Normal size. Normal location. Normal condition.  Urethra:  No strictures.  External Sphincter:  Normal.  Verumontanum:  Normal.  Prostate:  Non-obstructing. No hyperplasia.  Bladder Neck:  Non-obstructing.  Ureteral Orifices:  Normal location. Normal size. Normal shape. Effluxed clear urine.  Bladder:  Bladder mucosa appeared grossly normal except for some small papillary lesion noted just at the inferior bladder neck on the right consistent with probable small urothelial tumor.      The lower urinary tract was carefully examined. The procedure  was well-tolerated and without complications. Antibiotic instructions were given. Instructions were given to call the office immediately for bloody urine, difficulty urinating, urinary retention, painful or frequent urination, fever, chills, nausea, vomiting or other illness. The patient stated that he understood these instructions and would comply with them.         Urinalysis w/Scope - 81001 Dipstick Dipstick Cont'd Micro  Color: Yellow Bilirubin: Neg mg/dL WBC/hpf: 6 - 10/hpf  Appearance: Clear Ketones: Neg mg/dL RBC/hpf: 10 - 20/hpf  Specific Gravity: 1.025 Blood: 1+ ery/uL Bacteria: Rare (0-9/hpf)  pH: 5.5 Protein: 1+ mg/dL Cystals: NS (Not Seen)  Glucose: Neg mg/dL Urobilinogen: 0.2 mg/dL Casts: NS (Not Seen)    Nitrites: Neg Trichomonas: Not Present    Leukocyte  Esterase: Trace leu/uL Mucous: Not Present      Epithelial Cells: 0 - 5/hpf      Yeast: NS (Not Seen)      Sperm: Not Present    ASSESSMENT:      ICD-10 Details  1 GU:   Gross hematuria - R31.0   2   Renal calculus - N20.0   3   Bladder tumor/neoplasm - D41.4    PLAN:           Document Letter(s):  Created for Patient: Clinical Summary         Notes:   I discussed cystoscopic findings with the patient. Recommended cysto and TURBT in retrogrades. Risks and benefits discussed as outlined below.  TURBT consent: I have discussed with the patient the risks, benefits of TURBT which include but are not limited to: Bleeding, infection, damage to the bladder with potential perforation of the bladder, damage to surrounding organs, possible need for further procedures including open repair and catheterization, possibility of nonhealing area within the bladder, urgency, frequency which may be refractory to medications. I pointed out that in some occasions after resection of the bladder tumor, mitomycin-C chemotherapy may be instilled into the bladder. The risks associated with this therapy include but are not limited to: Refractory or new onset urgency, frequency, dysuria, infrequently severe systemic side effects secondary to mitomycin-C. After full discussion of the risks, benefits and alternatives, the patient has consented to the above procedure and desires to proceed.

## 2020-07-31 ENCOUNTER — Ambulatory Visit (HOSPITAL_BASED_OUTPATIENT_CLINIC_OR_DEPARTMENT_OTHER): Payer: 59 | Admitting: Physician Assistant

## 2020-07-31 ENCOUNTER — Ambulatory Visit (HOSPITAL_BASED_OUTPATIENT_CLINIC_OR_DEPARTMENT_OTHER)
Admission: RE | Admit: 2020-07-31 | Discharge: 2020-07-31 | Disposition: A | Discharge status: Home / Self Care | Payer: 59 | Attending: Urology | Admitting: Urology

## 2020-07-31 ENCOUNTER — Encounter (HOSPITAL_BASED_OUTPATIENT_CLINIC_OR_DEPARTMENT_OTHER): Payer: Self-pay | Admitting: Urology

## 2020-07-31 ENCOUNTER — Other Ambulatory Visit: Payer: Self-pay

## 2020-07-31 ENCOUNTER — Encounter (HOSPITAL_BASED_OUTPATIENT_CLINIC_OR_DEPARTMENT_OTHER)
Admission: RE | Disposition: A | Discharge status: Home / Self Care | Payer: Self-pay | Source: Home / Self Care | Attending: Urology

## 2020-07-31 DIAGNOSIS — D494 Neoplasm of unspecified behavior of bladder: Secondary | ICD-10-CM | POA: Diagnosis present

## 2020-07-31 DIAGNOSIS — Z79899 Other long term (current) drug therapy: Secondary | ICD-10-CM | POA: Diagnosis not present

## 2020-07-31 DIAGNOSIS — Z8572 Personal history of non-Hodgkin lymphomas: Secondary | ICD-10-CM | POA: Insufficient documentation

## 2020-07-31 DIAGNOSIS — C675 Malignant neoplasm of bladder neck: Secondary | ICD-10-CM | POA: Insufficient documentation

## 2020-07-31 DIAGNOSIS — Z941 Heart transplant status: Secondary | ICD-10-CM | POA: Diagnosis not present

## 2020-07-31 DIAGNOSIS — E039 Hypothyroidism, unspecified: Secondary | ICD-10-CM | POA: Diagnosis not present

## 2020-07-31 DIAGNOSIS — Z7952 Long term (current) use of systemic steroids: Secondary | ICD-10-CM | POA: Diagnosis not present

## 2020-07-31 DIAGNOSIS — Z79890 Hormone replacement therapy (postmenopausal): Secondary | ICD-10-CM | POA: Insufficient documentation

## 2020-07-31 DIAGNOSIS — Z9221 Personal history of antineoplastic chemotherapy: Secondary | ICD-10-CM | POA: Insufficient documentation

## 2020-07-31 DIAGNOSIS — Z9081 Acquired absence of spleen: Secondary | ICD-10-CM | POA: Diagnosis not present

## 2020-07-31 DIAGNOSIS — Z85038 Personal history of other malignant neoplasm of large intestine: Secondary | ICD-10-CM | POA: Insufficient documentation

## 2020-07-31 HISTORY — DX: Neoplasm of unspecified behavior of bladder: D49.4

## 2020-07-31 HISTORY — PX: CYSTOSCOPY W/ RETROGRADES: SHX1426

## 2020-07-31 HISTORY — DX: Gout, unspecified: M10.9

## 2020-07-31 HISTORY — PX: TRANSURETHRAL RESECTION OF BLADDER TUMOR: SHX2575

## 2020-07-31 HISTORY — DX: Disorder of the skin and subcutaneous tissue, unspecified: L98.9

## 2020-07-31 SURGERY — TURBT (TRANSURETHRAL RESECTION OF BLADDER TUMOR)
Anesthesia: General | Site: Renal

## 2020-07-31 MED ORDER — SODIUM CHLORIDE 0.9 % IR SOLN
Status: DC | PRN
  Administered 2020-07-31: 3000 mL

## 2020-07-31 MED ORDER — SODIUM CHLORIDE 0.9 % IV SOLN
2.0000 g | Freq: Once | INTRAVENOUS | Status: AC
  Administered 2020-07-31: 2 g via INTRAVENOUS
  Filled 2020-07-31: qty 2

## 2020-07-31 MED ORDER — PHENYLEPHRINE 40 MCG/ML (10ML) SYRINGE FOR IV PUSH (FOR BLOOD PRESSURE SUPPORT)
PREFILLED_SYRINGE | INTRAVENOUS | Status: DC | PRN
  Administered 2020-07-31 (×4): 80 ug via INTRAVENOUS

## 2020-07-31 MED ORDER — PHENYLEPHRINE 40 MCG/ML (10ML) SYRINGE FOR IV PUSH (FOR BLOOD PRESSURE SUPPORT)
PREFILLED_SYRINGE | INTRAVENOUS | Status: AC
  Filled 2020-07-31: qty 10

## 2020-07-31 MED ORDER — PROPOFOL 500 MG/50ML IV EMUL
INTRAVENOUS | Status: DC | PRN
  Administered 2020-07-31: 150 ug/kg/min via INTRAVENOUS

## 2020-07-31 MED ORDER — BELLADONNA ALKALOIDS-OPIUM 16.2-60 MG RE SUPP
RECTAL | Status: DC | PRN
  Administered 2020-07-31: 1 via RECTAL

## 2020-07-31 MED ORDER — FENTANYL CITRATE (PF) 100 MCG/2ML IJ SOLN
INTRAMUSCULAR | Status: DC | PRN
Start: 2020-07-31 — End: 2020-07-31
  Administered 2020-07-31: 50 ug via INTRAVENOUS

## 2020-07-31 MED ORDER — SODIUM CHLORIDE (PF) 0.9 % IJ SOLN
INTRAMUSCULAR | Status: AC
  Filled 2020-07-31: qty 10

## 2020-07-31 MED ORDER — ACETAMINOPHEN 500 MG PO TABS
ORAL_TABLET | ORAL | Status: AC
  Filled 2020-07-31: qty 2

## 2020-07-31 MED ORDER — ACETAMINOPHEN 500 MG PO TABS
1000.0000 mg | ORAL_TABLET | Freq: Once | ORAL | Status: AC
  Administered 2020-07-31: 1000 mg via ORAL

## 2020-07-31 MED ORDER — LIDOCAINE 2% (20 MG/ML) 5 ML SYRINGE
INTRAMUSCULAR | Status: AC
  Filled 2020-07-31: qty 5

## 2020-07-31 MED ORDER — MIDAZOLAM HCL 2 MG/2ML IJ SOLN
INTRAMUSCULAR | Status: AC
  Filled 2020-07-31: qty 2

## 2020-07-31 MED ORDER — PROPOFOL 500 MG/50ML IV EMUL
INTRAVENOUS | Status: AC
  Filled 2020-07-31: qty 50

## 2020-07-31 MED ORDER — PROPOFOL 500 MG/50ML IV EMUL
INTRAVENOUS | Status: DC | PRN
  Administered 2020-07-31: 20 mg via INTRAVENOUS
  Administered 2020-07-31: 170 mg via INTRAVENOUS

## 2020-07-31 MED ORDER — EPINEPHRINE 1 MG/10ML IJ SOSY
PREFILLED_SYRINGE | INTRAMUSCULAR | Status: AC
  Filled 2020-07-31: qty 10

## 2020-07-31 MED ORDER — FENTANYL CITRATE (PF) 100 MCG/2ML IJ SOLN
INTRAMUSCULAR | Status: AC
  Filled 2020-07-31: qty 2

## 2020-07-31 MED ORDER — BELLADONNA ALKALOIDS-OPIUM 16.2-60 MG RE SUPP
RECTAL | Status: AC
  Filled 2020-07-31: qty 1

## 2020-07-31 MED ORDER — LACTATED RINGERS IV SOLN: INTRAVENOUS | Status: DC

## 2020-07-31 MED ORDER — FENTANYL CITRATE (PF) 100 MCG/2ML IJ SOLN: 25.0000 ug | INTRAMUSCULAR | Status: DC | PRN

## 2020-07-31 MED ORDER — IOHEXOL 300 MG/ML  SOLN
INTRAMUSCULAR | Status: DC | PRN
  Administered 2020-07-31: 13 mL via URETHRAL

## 2020-07-31 SURGICAL SUPPLY — 37 items
BAG DRAIN URO-CYSTO SKYTR STRL (DRAIN) ×4 IMPLANT
BAG DRN RND TRDRP ANRFLXCHMBR (UROLOGICAL SUPPLIES)
BAG DRN UROCATH (DRAIN) ×2
BAG URINE DRAIN 2000ML AR STRL (UROLOGICAL SUPPLIES) IMPLANT
BAG URINE LEG 500ML (DRAIN) IMPLANT
CATH FOLEY 2WAY SLVR  5CC 20FR (CATHETERS)
CATH FOLEY 2WAY SLVR  5CC 22FR (CATHETERS)
CATH FOLEY 2WAY SLVR 5CC 20FR (CATHETERS) IMPLANT
CATH FOLEY 2WAY SLVR 5CC 22FR (CATHETERS) IMPLANT
CATH URET 5FR 28IN CONE TIP (BALLOONS)
CATH URET 5FR 28IN OPEN ENDED (CATHETERS) IMPLANT
CATH URET 5FR 70CM CONE TIP (BALLOONS) IMPLANT
CLOTH BEACON ORANGE TIMEOUT ST (SAFETY) ×4 IMPLANT
ELECT REM PT RETURN 9FT ADLT (ELECTROSURGICAL)
ELECTRODE REM PT RTRN 9FT ADLT (ELECTROSURGICAL) ×2 IMPLANT
EVACUATOR MICROVAS BLADDER (UROLOGICAL SUPPLIES) IMPLANT
FIBER LASER FLEXIVA 365 (UROLOGICAL SUPPLIES) IMPLANT
FIBER LASER TRAC TIP (UROLOGICAL SUPPLIES) IMPLANT
GLOVE BIO SURGEON STRL SZ7.5 (GLOVE) ×4 IMPLANT
GLOVE BIOGEL PI IND STRL 7.0 (GLOVE) IMPLANT
GLOVE BIOGEL PI INDICATOR 7.0 (GLOVE) ×2
GLOVE SURG SYN 6.5 ES PF (GLOVE) ×4 IMPLANT
GLOVE SURG SYN 6.5 PF PI (GLOVE) IMPLANT
GOWN STRL REUS W/ TWL XL LVL3 (GOWN DISPOSABLE) ×2 IMPLANT
GOWN STRL REUS W/TWL XL LVL3 (GOWN DISPOSABLE) ×8
GUIDEWIRE ANG ZIPWIRE 038X150 (WIRE) IMPLANT
GUIDEWIRE STR DUAL SENSOR (WIRE) IMPLANT
HOLDER FOLEY CATH W/STRAP (MISCELLANEOUS) IMPLANT
KIT TURNOVER CYSTO (KITS) ×4 IMPLANT
MANIFOLD NEPTUNE II (INSTRUMENTS) ×2 IMPLANT
PACK CYSTO (CUSTOM PROCEDURE TRAY) ×4 IMPLANT
PLUG CATH AND CAP STER (CATHETERS) IMPLANT
SYR 20ML LL LF (SYRINGE) ×4 IMPLANT
SYR TOOMEY IRRIG 70ML (MISCELLANEOUS)
SYRINGE TOOMEY IRRIG 70ML (MISCELLANEOUS) IMPLANT
TUBE CONNECTING 12'X1/4 (SUCTIONS) ×1
TUBE CONNECTING 12X1/4 (SUCTIONS) ×1 IMPLANT

## 2020-07-31 NOTE — Transfer of Care (Signed)
Immediate Anesthesia Transfer of Care Note  Patient: Glenn Gonzalez  Procedure(s) Performed: TRANSURETHRAL RESECTION OF BLADDER TUMOR (TURBT) FULGERATION (N/A Bladder) CYSTOSCOPY WITH RETROGRADE PYELOGRAM (Bilateral Renal)  Patient Location: PACU  Anesthesia Type:General  Level of Consciousness: awake, alert  and oriented  Airway & Oxygen Therapy: Patient Spontanous Breathing and Patient connected to nasal cannula oxygen  Post-op Assessment: Report given to RN  Post vital signs: Reviewed and stable  Last Vitals:  Vitals Value Taken Time  BP 106/69 07/31/20 1141  Temp    Pulse 73 07/31/20 1143  Resp 19 07/31/20 1143  SpO2 100 % 07/31/20 1143  Vitals shown include unvalidated device data.  Last Pain:  Vitals:   07/31/20 0836  TempSrc: Oral  PainSc: 0-No pain         Complications: No complications documented.

## 2020-07-31 NOTE — Anesthesia Postprocedure Evaluation (Signed)
Anesthesia Post Note  Patient: Glenn Gonzalez  Procedure(s) Performed: TRANSURETHRAL RESECTION OF BLADDER TUMOR (TURBT) FULGERATION (N/A Bladder) CYSTOSCOPY WITH RETROGRADE PYELOGRAM (Bilateral Renal)     Patient location during evaluation: PACU Anesthesia Type: General Level of consciousness: awake and alert Pain management: pain level controlled Vital Signs Assessment: post-procedure vital signs reviewed and stable Respiratory status: spontaneous breathing, nonlabored ventilation, respiratory function stable and patient connected to nasal cannula oxygen Cardiovascular status: blood pressure returned to baseline and stable Postop Assessment: no apparent nausea or vomiting Anesthetic complications: no   No complications documented.  Last Vitals:  Vitals:   07/31/20 1215 07/31/20 1228  BP: 114/71 134/85  Pulse: 75 79  Resp: 18 18  Temp:  36.5 C  SpO2: 99% 100%    Last Pain:  Vitals:   07/31/20 1228  TempSrc:   PainSc: 0-No pain                 Zaven Klemens L Cybele Maule

## 2020-07-31 NOTE — Discharge Instructions (Signed)
Post Anesthesia Home Care Instructions  Activity: Get plenty of rest for the remainder of the day. A responsible adult should stay with you for 24 hours following the procedure.  For the next 24 hours, DO NOT: -Drive a car -Paediatric nurse -Drink alcoholic beverages -Take any medication unless instructed by your physician -Make any legal decisions or sign important papers.  Meals: Start with liquid foods such as gelatin or soup. Progress to regular foods as tolerated. Avoid greasy, spicy, heavy foods. If nausea and/or vomiting occur, drink only clear liquids until the nausea and/or vomiting subsides. Call your physician if vomiting continues.  Special Instructions/Symptoms: Your throat may feel dry or sore from the anesthesia or the breathing tube placed in your throat during surgery. If this causes discomfort, gargle with warm salt water. The discomfort should disappear within 24 hours.  If you had a scopolamine patch placed behind your ear for the management of post- operative nausea and/or vomiting:  1. The medication in the patch is effective for 72 hours, after which it should be removed.  Wrap patch in a tissue and discard in the trash. Wash hands thoroughly with soap and water. 2. You may remove the patch earlier than 72 hours if you experience unpleasant side effects which may include dry mouth, dizziness or visual disturbances. 3. Avoid touching the patch. Wash your hands with soap and water after contact with the patch.      Transurethral Resection of Bladder Tumor  Transurethral resection of a bladder tumor is the removal (resection) of a cancerous growth (tumor) on the inside wall of the bladder. The bladder is the organ that holds urine. The tumor is removed through the tube that carries urine out of the body (urethra). In a transurethral resection, a thin telescope with a light, a tiny camera, and an electric cutting edge (resectoscope) is passed through the urethra. In  men, the opening of the urethra is at the end of the penis. In women, it is just above the opening of the vagina. Tell a health care provider about:  Any allergies you have.  All medicines you are taking, including vitamins, herbs, eye drops, creams, and over-the-counter medicines.  Any problems you or family members have had with anesthetic medicines.  Any blood disorders you have.  Any surgeries you have had.  Any medical conditions you have.  Any recent urinary tract infections you have had.  Whether you are pregnant or may be pregnant. What are the risks? Generally, this is a safe procedure. However, problems may occur, including:  Infection.  Bleeding.  Allergic reactions to medicines.  Damage to nearby structures or organs, such as: ? The urethra. ? The tubes that drain urine from the kidneys into the bladder (ureters).  Pain and burning during urination.  Difficulty urinating due to partial blockage of the urethra.  Inability to urinate (urinary retention). What happens before the procedure? Staying hydrated Follow instructions from your health care provider about hydration, which may include:  Up to 2 hours before the procedure - you may continue to drink clear liquids, such as water, clear fruit juice, black coffee, and plain tea.  Eating and drinking restrictions Follow instructions from your health care provider about eating and drinking, which may include:  8 hours before the procedure - stop eating heavy meals or foods, such as meat, fried foods, or fatty foods.  6 hours before the procedure - stop eating light meals or foods, such as toast or cereal.  6 hours  before the procedure - stop drinking milk or drinks that contain milk.  2 hours before the procedure - stop drinking clear liquids. Medicines Ask your health care provider about:  Changing or stopping your regular medicines. This is especially important if you are taking diabetes medicines or  blood thinners.  Taking medicines such as aspirin and ibuprofen. These medicines can thin your blood. Do not take these medicines unless your health care provider tells you to take them.  Taking over-the-counter medicines, vitamins, herbs, and supplements. Tests You may have exams or tests, including:  Physical exam.  Blood tests.  Urine tests.  Electrocardiogram (ECG). This test measures the electrical activity of the heart. General instructions  Plan to have someone take you home from the hospital or clinic.  Ask your health care provider how your surgical site will be marked or identified.  Ask your health care provider what steps will be taken to help prevent infection. These may include: ? Washing skin with a germ-killing soap. ? Taking antibiotic medicine. What happens during the procedure?  An IV will be inserted into one of your veins.  You will be given one or more of the following: ? A medicine to help you relax (sedative). ? A medicine to make you fall asleep (general anesthetic). ? A medicine that is injected into your spine to numb the area below and slightly above the injection site (spinal anesthetic).  Your legs will be placed in foot rests (stirrups) so that your legs are apart and your knees are bent.  The resectoscope will be passed through your urethra and into your bladder.  The part of your bladder that is affected by the tumor will be resected using the cutting edge of the resectoscope.  The resectoscope will be removed.  A thin, flexible tube (catheter) will be passed through your urethra and into your bladder. The catheter will drain urine into a bag outside of your body. ? Fluid may be passed through the catheter to keep the catheter open. The procedure may vary among health care providers and hospitals. What happens after the procedure?  Your blood pressure, heart rate, breathing rate, and blood oxygen level will be monitored until you leave  the hospital or clinic.  You may continue to receive fluids and medicines through an IV.  You will have some pain. You will be given pain medicine to relieve pain.  You will have a catheter to drain your urine. ? You will have blood in your urine. Your catheter may be kept in until your urine is clear. ? The amount of urine will be monitored. If necessary, your bladder may be rinsed out (irrigated) by passing fluid through your catheter.  You will be encouraged to walk around as soon as possible.  You may have to wear compression stockings. These stockings help to prevent blood clots and reduce swelling in your legs.  Do not drive for 24 hours if you were given a sedative during your procedure. Summary  Transurethral resection of a bladder tumor is the removal (resection) of a cancerous growth (tumor) on the inside wall of the bladder.  To do this procedure, your health care provider uses a thin telescope with a light, a tiny camera, and an electric cutting edge (resectoscope).  Follow your health care provider's instructions. You may need to stop or change certain medicines, and you may be told to stop eating and drinking several hours before the procedure.  Your blood pressure, heart rate, breathing rate,  and blood oxygen level will be monitored until you leave the hospital or clinic.  You may have to wear compression stockings. These stockings help to prevent blood clots and reduce swelling in your legs. This information is not intended to replace advice given to you by your health care provider. Make sure you discuss any questions you have with your health care provider. Document Revised: 06/18/2018 Document Reviewed: 06/18/2018 Elsevier Patient Education  [employer].

## 2020-07-31 NOTE — Op Note (Signed)
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  Tumor was removed with rigid biopsy forceps and base cauterized.  Bilateral retrogrades were normal  Operative note: After obtaining informed consent for the patient is taken the major cystoscopy suite and placed under general anesthesia.  Placed in the dorsolithotomy position genitalia prepped and draped in usual sterile fashion.  Proper pause and timeout was performed.  Cystoscopy is carried out with a 21 Fransico.  Bladder was thoroughly spread with 30 and 70 degree lenses.  A papillary bladder tumor was noted from the inferior bladder neck on the right and left sides measuring total surface area of about 2-1/2 cm it was definitely exophytic and very superficial appearing.  Ureteral orifice ease were in normal position were effluxing clear urine.  Right ureteral orifice was somewhat patulous left ureteral orifice somewhat narrowed.  Bilateral retrograde pyelograms were performed with a 5 French open tip catheter.  Both upper tracts showed no evidence of filling defects or hydronephrosis.  Both upper tracts emptied out promptly upon removal of the retrograde catheter.  Attention was then directed towards removal of the bladder tumor.  Because of the superficial nature of the tumor and in order to preserve pathologic integrity from cautery artifact it was felt that these would be best removed with rigid biopsy forceps.  The rigid biopsy forceps were used to completely remove the exophytic tumors and were sent for pathologic evaluation.  The base was then cauterized  with the Bugbee electrode with good hemostasis noted.  No remaining tumor visualized.  Bladder was emptied the area reinspected with good hemostasis.  Scope was removed procedure was terminated.  He was awakened from anesthesia and taken back to the recovery room in stable condition.  No immediate complication from the procedure.

## 2020-07-31 NOTE — Anesthesia Preprocedure Evaluation (Addendum)
Anesthesia Evaluation  Patient identified by MRN, date of birth, ID band Patient awake    Reviewed: Allergy & Precautions, NPO status , Patient's Chart, lab work & pertinent test results  Airway Mallampati: I  TM Distance: >3 FB Neck ROM: Full    Dental  (+) Dental Advisory Given, Teeth Intact   Pulmonary shortness of breath and with exertion,    Pulmonary exam normal breath sounds clear to auscultation       Cardiovascular Normal cardiovascular exam Rhythm:Regular Rate:Normal  CP exercise test Conclusion: Exercise testing with gas exchange demonstrates a moderate functional impairment in a healthy population norm. However, PVO2 is more so in normal range for post-heart transplant. There was a significant degree of chronotropic incompetence and hypotensive response to incremental exercise,likely related to denervation, particularly in the setting of exertion. Also note, VE/VCO2 slope is also mildly elevated beyond a widened norm range for heart transplant. Patient's body habitus is playing a role in his exercise intolerance. Of note, pre-exercise spirometry demonstrates mild to moderate restrictive properties.   H/o non ischemic CM S/P Heart transplant 2017 H/o Atrial fibrillation and V. Tach: none since transplant H/o AICD placement  TTE 2021 NORMAL LEFT VENTRICULAR SYSTOLIC FUNCTION  NORMAL LA PRESSURES WITH NORMAL DIASTOLIC FUNCTION  NORMAL RIGHT VENTRICULAR SYSTOLIC FUNCTION  VALVULAR REGURGITATION: TRIVIAL MR, TRIVIAL PR, MILD TR  NO VALVULAR STENOSIS  MILD TR  LHC 2021 Impression:  1. Normal origin and course of coronary arteries  2. Minimal calcific atherosclerotic disease of the LAD near the takeoff of D3 and within the proximal D3 vessel with luminal narrowing 1-24%. CAD-RADS 1         3. CTFFR Analysis: CT FFR will not be performed for this study     Neuro/Psych PSYCHIATRIC DISORDERS Depression negative  neurological ROS     GI/Hepatic Neg liver ROS, GERD  Medicated and Controlled,  Endo/Other  Hypothyroidism   Renal/GU Renal InsufficiencyRenal disease (Cr 1.52, K 4.1)     Musculoskeletal Gout   Abdominal   Peds  Hematology  (+) Blood dyscrasia, anemia ,   Anesthesia Other Findings Bladder tumor  Reproductive/Obstetrics                            Anesthesia Physical  Anesthesia Plan  ASA: III  Anesthesia Plan: General   Post-op Pain Management:    Induction: Intravenous  PONV Risk Score and Plan: 2 and Propofol infusion and Treatment may vary due to age or medical condition  Airway Management Planned: LMA  Additional Equipment:   Intra-op Plan:   Post-operative Plan: Extubation in OR  Informed Consent: I have reviewed the patients History and Physical, chart, labs and discussed the procedure including the risks, benefits and alternatives for the proposed anesthesia with the patient or authorized representative who has indicated his/her understanding and acceptance.     Dental advisory given  Plan Discussed with: CRNA  Anesthesia Plan Comments:         Anesthesia Quick Evaluation

## 2020-07-31 NOTE — Interval H&P Note (Signed)
History and Physical Interval Note:  07/31/2020 8:32 AM  Glenn Gonzalez  has presented today for surgery, with the diagnosis of BLADDER TUMOR.  The various methods of treatment have been discussed with the patient and family. After consideration of risks, benefits and other options for treatment, the patient has consented to  Procedure(s) with comments: TRANSURETHRAL RESECTION OF BLADDER TUMOR (TURBT) (N/A) - 30 MINS CYSTOSCOPY WITH RETROGRADE PYELOGRAM (Bilateral) as a surgical intervention.  The patient's history has been reviewed, patient examined, no change in status, stable for surgery.  I have reviewed the patient's chart and labs.  Questions were answered to the patient's satisfaction.     Remi Haggard

## 2020-07-31 NOTE — Anesthesia Procedure Notes (Signed)
Procedure Name: LMA Insertion Date/Time: 07/31/2020 10:50 AM Performed by: Bonney Aid, CRNA Pre-anesthesia Checklist: Patient identified, Emergency Drugs available, Suction available and Patient being monitored Patient Re-evaluated:Patient Re-evaluated prior to induction Oxygen Delivery Method: Circle system utilized Preoxygenation: Pre-oxygenation with 100% oxygen Induction Type: IV induction Ventilation: Mask ventilation without difficulty LMA: LMA inserted LMA Size: 5.0 Number of attempts: 1 Airway Equipment and Method: Bite block Placement Confirmation: positive ETCO2 Tube secured with: Tape Dental Injury: Teeth and Oropharynx as per pre-operative assessment

## 2020-08-01 ENCOUNTER — Encounter (HOSPITAL_BASED_OUTPATIENT_CLINIC_OR_DEPARTMENT_OTHER): Payer: Self-pay | Admitting: Urology

## 2020-08-01 LAB — SURGICAL PATHOLOGY

## 2020-08-03 ENCOUNTER — Ambulatory Visit
Admission: RE | Admit: 2020-08-03 | Discharge: 2020-08-03 | Disposition: A | Discharge status: Home / Self Care | Payer: 59 | Source: Ambulatory Visit | Attending: Internal Medicine | Admitting: Internal Medicine

## 2020-08-03 ENCOUNTER — Ambulatory Visit: Payer: 59 | Admitting: Hematology

## 2020-08-03 ENCOUNTER — Other Ambulatory Visit: Payer: Self-pay | Admitting: Internal Medicine

## 2020-08-03 ENCOUNTER — Other Ambulatory Visit: Payer: 59

## 2020-08-03 DIAGNOSIS — R059 Cough, unspecified: Secondary | ICD-10-CM

## 2020-08-08 ENCOUNTER — Other Ambulatory Visit: Payer: Self-pay | Admitting: Gastroenterology

## 2020-08-08 ENCOUNTER — Other Ambulatory Visit (HOSPITAL_COMMUNITY)
Admission: RE | Admit: 2020-08-08 | Discharge: 2020-08-08 | Disposition: A | Discharge status: Home / Self Care | Payer: 59 | Source: Ambulatory Visit | Attending: Gastroenterology | Admitting: Gastroenterology

## 2020-08-08 DIAGNOSIS — Z01818 Encounter for other preprocedural examination: Secondary | ICD-10-CM | POA: Insufficient documentation

## 2020-08-08 DIAGNOSIS — Z20822 Contact with and (suspected) exposure to covid-19: Secondary | ICD-10-CM | POA: Insufficient documentation

## 2020-08-08 LAB — SARS CORONAVIRUS 2 (TAT 6-24 HRS): SARS Coronavirus 2: NEGATIVE

## 2020-08-09 ENCOUNTER — Other Ambulatory Visit: Payer: Self-pay

## 2020-08-10 ENCOUNTER — Ambulatory Visit (HOSPITAL_COMMUNITY)
Admission: RE | Admit: 2020-08-10 | Discharge: 2020-08-10 | Disposition: A | Discharge status: Home / Self Care | Payer: 59 | Attending: Gastroenterology | Admitting: Gastroenterology

## 2020-08-10 ENCOUNTER — Ambulatory Visit (HOSPITAL_COMMUNITY): Payer: 59 | Admitting: Certified Registered Nurse Anesthetist

## 2020-08-10 ENCOUNTER — Encounter (HOSPITAL_COMMUNITY)
Admission: RE | Disposition: A | Discharge status: Home / Self Care | Payer: Self-pay | Source: Home / Self Care | Attending: Gastroenterology

## 2020-08-10 ENCOUNTER — Encounter (HOSPITAL_COMMUNITY): Payer: Self-pay | Admitting: Gastroenterology

## 2020-08-10 ENCOUNTER — Other Ambulatory Visit: Payer: Self-pay

## 2020-08-10 DIAGNOSIS — Z85828 Personal history of other malignant neoplasm of skin: Secondary | ICD-10-CM | POA: Diagnosis not present

## 2020-08-10 DIAGNOSIS — Z8489 Family history of other specified conditions: Secondary | ICD-10-CM | POA: Insufficient documentation

## 2020-08-10 DIAGNOSIS — N181 Chronic kidney disease, stage 1: Secondary | ICD-10-CM | POA: Diagnosis not present

## 2020-08-10 DIAGNOSIS — I341 Nonrheumatic mitral (valve) prolapse: Secondary | ICD-10-CM | POA: Diagnosis not present

## 2020-08-10 DIAGNOSIS — Z882 Allergy status to sulfonamides status: Secondary | ICD-10-CM | POA: Insufficient documentation

## 2020-08-10 DIAGNOSIS — Z8249 Family history of ischemic heart disease and other diseases of the circulatory system: Secondary | ICD-10-CM | POA: Diagnosis not present

## 2020-08-10 DIAGNOSIS — D122 Benign neoplasm of ascending colon: Secondary | ICD-10-CM | POA: Insufficient documentation

## 2020-08-10 DIAGNOSIS — D84821 Immunodeficiency due to drugs: Secondary | ICD-10-CM | POA: Diagnosis not present

## 2020-08-10 DIAGNOSIS — D649 Anemia, unspecified: Secondary | ICD-10-CM | POA: Insufficient documentation

## 2020-08-10 DIAGNOSIS — K317 Polyp of stomach and duodenum: Secondary | ICD-10-CM | POA: Diagnosis not present

## 2020-08-10 DIAGNOSIS — C679 Malignant neoplasm of bladder, unspecified: Secondary | ICD-10-CM | POA: Insufficient documentation

## 2020-08-10 DIAGNOSIS — Z8571 Personal history of Hodgkin lymphoma: Secondary | ICD-10-CM | POA: Insufficient documentation

## 2020-08-10 DIAGNOSIS — K635 Polyp of colon: Secondary | ICD-10-CM | POA: Diagnosis not present

## 2020-08-10 DIAGNOSIS — F329 Major depressive disorder, single episode, unspecified: Secondary | ICD-10-CM | POA: Diagnosis not present

## 2020-08-10 DIAGNOSIS — E039 Hypothyroidism, unspecified: Secondary | ICD-10-CM | POA: Insufficient documentation

## 2020-08-10 DIAGNOSIS — E739 Lactose intolerance, unspecified: Secondary | ICD-10-CM | POA: Insufficient documentation

## 2020-08-10 DIAGNOSIS — K219 Gastro-esophageal reflux disease without esophagitis: Secondary | ICD-10-CM | POA: Diagnosis present

## 2020-08-10 DIAGNOSIS — I509 Heart failure, unspecified: Secondary | ICD-10-CM | POA: Diagnosis not present

## 2020-08-10 DIAGNOSIS — Z941 Heart transplant status: Secondary | ICD-10-CM | POA: Insufficient documentation

## 2020-08-10 DIAGNOSIS — I4891 Unspecified atrial fibrillation: Secondary | ICD-10-CM | POA: Insufficient documentation

## 2020-08-10 DIAGNOSIS — Z85048 Personal history of other malignant neoplasm of rectum, rectosigmoid junction, and anus: Secondary | ICD-10-CM | POA: Insufficient documentation

## 2020-08-10 DIAGNOSIS — I129 Hypertensive chronic kidney disease with stage 1 through stage 4 chronic kidney disease, or unspecified chronic kidney disease: Secondary | ICD-10-CM | POA: Insufficient documentation

## 2020-08-10 DIAGNOSIS — Z1211 Encounter for screening for malignant neoplasm of colon: Secondary | ICD-10-CM | POA: Diagnosis not present

## 2020-08-10 DIAGNOSIS — R05 Cough: Secondary | ICD-10-CM | POA: Diagnosis not present

## 2020-08-10 DIAGNOSIS — Z87442 Personal history of urinary calculi: Secondary | ICD-10-CM | POA: Insufficient documentation

## 2020-08-10 DIAGNOSIS — Z888 Allergy status to other drugs, medicaments and biological substances status: Secondary | ICD-10-CM | POA: Insufficient documentation

## 2020-08-10 DIAGNOSIS — Z8349 Family history of other endocrine, nutritional and metabolic diseases: Secondary | ICD-10-CM | POA: Diagnosis not present

## 2020-08-10 DIAGNOSIS — Z880 Allergy status to penicillin: Secondary | ICD-10-CM | POA: Insufficient documentation

## 2020-08-10 DIAGNOSIS — D124 Benign neoplasm of descending colon: Secondary | ICD-10-CM | POA: Diagnosis not present

## 2020-08-10 HISTORY — PX: POLYPECTOMY: SHX5525

## 2020-08-10 HISTORY — PX: ESOPHAGOGASTRODUODENOSCOPY (EGD) WITH PROPOFOL: SHX5813

## 2020-08-10 HISTORY — PX: COLONOSCOPY WITH PROPOFOL: SHX5780

## 2020-08-10 SURGERY — ESOPHAGOGASTRODUODENOSCOPY (EGD) WITH PROPOFOL
Anesthesia: Monitor Anesthesia Care

## 2020-08-10 MED ORDER — SODIUM CHLORIDE 0.9 % IV SOLN: INTRAVENOUS | Status: DC

## 2020-08-10 MED ORDER — PHENYLEPHRINE 40 MCG/ML (10ML) SYRINGE FOR IV PUSH (FOR BLOOD PRESSURE SUPPORT)
PREFILLED_SYRINGE | INTRAVENOUS | Status: DC | PRN
  Administered 2020-08-10: 80 ug via INTRAVENOUS

## 2020-08-10 MED ORDER — LIDOCAINE 2% (20 MG/ML) 5 ML SYRINGE
INTRAMUSCULAR | Status: DC | PRN
  Administered 2020-08-10: 60 mg via INTRAVENOUS

## 2020-08-10 MED ORDER — PROPOFOL 1000 MG/100ML IV EMUL
INTRAVENOUS | Status: AC
  Filled 2020-08-10: qty 100

## 2020-08-10 MED ORDER — PROPOFOL 10 MG/ML IV BOLUS
INTRAVENOUS | Status: DC | PRN
  Administered 2020-08-10: 20 mg via INTRAVENOUS
  Administered 2020-08-10: 30 mg via INTRAVENOUS
  Administered 2020-08-10: 20 mg via INTRAVENOUS
  Administered 2020-08-10: 30 mg via INTRAVENOUS

## 2020-08-10 MED ORDER — PROPOFOL 500 MG/50ML IV EMUL
INTRAVENOUS | Status: AC
  Filled 2020-08-10: qty 50

## 2020-08-10 MED ORDER — LACTATED RINGERS IV SOLN: INTRAVENOUS | Status: DC

## 2020-08-10 MED ORDER — PROPOFOL 500 MG/50ML IV EMUL
INTRAVENOUS | Status: DC | PRN
  Administered 2020-08-10: 150 ug/kg/min via INTRAVENOUS

## 2020-08-10 SURGICAL SUPPLY — 25 items

## 2020-08-10 NOTE — Op Note (Signed)
[facility] Patient Name: Glenn Gonzalez Procedure Date: 08/10/2020 MRN: [id number] Attending MD: Carol Ada , MD Date of Birth: July 21, 1961 CSN: [id number] Age: 59 Admit Type: Outpatient Procedure:                Colonoscopy Indications:              High risk colon cancer surveillance: Personal                            history of colon cancer Providers:                Carol Ada, MD, Doristine Johns, RN, Cletis Athens, Technician Referring MD:              Medicines:                Propofol per Anesthesia Complications:            No immediate complications. Estimated Blood Loss:     Estimated blood loss was minimal. Procedure:                Pre-Anesthesia Assessment:                           - Prior to the procedure, a History and Physical                            was performed, and patient medications and                            allergies were reviewed. The patient's tolerance of                            previous anesthesia was also reviewed. The risks                            and benefits of the procedure and the sedation                            options and risks were discussed with the patient.                            All questions were answered, and informed consent                            was obtained. Prior Anticoagulants: The patient has                            taken no previous anticoagulant or antiplatelet                            agents. ASA Grade Assessment: III - A patient with                            severe  systemic disease. After reviewing the risks                            and benefits, the patient was deemed in                            satisfactory condition to undergo the procedure.                           - Sedation was administered by an anesthesia                            professional. Deep sedation was attained.                           After obtaining informed consent, the  colonoscope                            was passed under direct vision. Throughout the                            procedure, the patient's blood pressure, pulse, and                            oxygen saturations were monitored continuously. The                            [id number]-[id number] ([id number] Olympus pediatric colonscope                            was introduced through the anus and advanced to the                            the cecum, identified by appendiceal orifice and                            ileocecal valve. The colonoscopy was performed                            without difficulty. The patient tolerated the                            procedure well. The quality of the bowel                            preparation was excellent. The ileocecal valve,                            appendiceal orifice, and rectum were photographed. Scope In: 8:47:50 AM Scope Out: 9:17:54 AM Scope Withdrawal Time: 0 hours 21 minutes 41 seconds  Total Procedure Duration: 0 hours 30 minutes 4 seconds  Findings:      13 sessile polyps were found in the entire colon. The polyps were 2 to 5       mm in size. These polyps  were removed with a cold snare and cold biopsy       forceps. Resection and retrieval were complete. Close examination of the       rectal mucosa was negative for any recurrent malignancies. Two small       polyp were removed cold biopsy forceps. Impression:               - 13 2 to 5 mm polyps in the entire colon, removed                            with a cold snare. Resected and retrieved. Moderate Sedation:      Not Applicable - Patient had care per Anesthesia. Recommendation:           - Patient has a contact number available for                            emergencies. The signs and symptoms of potential                            delayed complications were discussed with the                            patient. Return to normal activities tomorrow.                            Written  discharge instructions were provided to the                            patient.                           - Resume previous diet.                           - Continue present medications.                           - Await pathology results.                           - Repeat colonoscopy in 1 year for surveillance.                           - FFS in 6 months to survey the rectum. Procedure Code(s):        --- Professional ---                           [phone], Colonoscopy, flexible; with removal of                            tumor(s), polyp(s), or other lesion(s) by snare                            technique Diagnosis Code(s):        --- Professional ---  K63.5, Polyp of colon                           Z85.038, Personal history of other malignant                            neoplasm of large intestine CPT copyright 2019 [employer]. All rights reserved. The codes documented in this report are preliminary and upon coder review may  be revised to meet current compliance requirements. Carol Ada, MD Carol Ada, MD 08/10/2020 9:28:34 AM This report has been signed electronically. Number of Addenda: 0

## 2020-08-10 NOTE — Discharge Instructions (Signed)

## 2020-08-10 NOTE — Transfer of Care (Signed)
Immediate Anesthesia Transfer of Care Note  Patient: Glenn Gonzalez  Procedure(s) Performed: ESOPHAGOGASTRODUODENOSCOPY (EGD) WITH PROPOFOL (N/A ) COLONOSCOPY WITH PROPOFOL (N/A ) POLYPECTOMY  Patient Location: Endoscopy Unit  Anesthesia Type:MAC  Level of Consciousness: drowsy and patient cooperative  Airway & Oxygen Therapy: Patient Spontanous Breathing and Patient connected to face mask oxygen  Post-op Assessment: Report given to RN and Post -op Vital signs reviewed and stable  Post vital signs: Reviewed and stable  Last Vitals:  Vitals Value Taken Time  BP 105/64   Temp    Pulse 67   Resp 14   SpO2 100%     Last Pain:  Vitals:   08/10/20 0730  TempSrc: Oral  PainSc: 2          Complications: No complications documented.

## 2020-08-10 NOTE — Anesthesia Postprocedure Evaluation (Signed)
Anesthesia Post Note  Patient: Glenn Gonzalez  Procedure(s) Performed: ESOPHAGOGASTRODUODENOSCOPY (EGD) WITH PROPOFOL (N/A ) COLONOSCOPY WITH PROPOFOL (N/A ) POLYPECTOMY     Patient location during evaluation: PACU Anesthesia Type: MAC Level of consciousness: awake and alert Pain management: pain level controlled Vital Signs Assessment: post-procedure vital signs reviewed and stable Respiratory status: spontaneous breathing and respiratory function stable Cardiovascular status: stable Postop Assessment: no apparent nausea or vomiting Anesthetic complications: no   No complications documented.  Last Vitals:  Vitals:   08/10/20 0951 08/10/20 1000  BP: 112/72 121/64  Pulse: 81 80  Resp: 14 17  Temp:    SpO2: 95% 97%    Last Pain:  Vitals:   08/10/20 1000  TempSrc:   PainSc: 0-No pain                 Merlinda Frederick

## 2020-08-10 NOTE — H&P (Signed)
Glenn Gonzalez HPI: For the past several months the patient reports a chronic cough.  He feels taht the coughing started after changing over from mycophenolate to sirolimus, however, [facility] does not feel that sirolimus is the source of the cough.  [name] does have an history of an LA Grade A esophagitis.  He is also here to undergo his routine colonoscopy as he was noted to have an early rectal cancer that was successfully removed endoscopically.  His immunosuppression does cause him to have early recurrence of polyps.  Recently he was also diagnosed with early bladder cancer and he will undergo BCG treatment.  Past Medical History:  Diagnosis Date  . A-fib ([place])    IN PAST with old heart   . Bladder tumor   . Bulging lumbar disc    states " i threw my back out on tuesday 6-16, i ahve a bulging disc, but i can lie on my back a side "   . Chronic fatigue   . Chronic kidney disease    CREATININE NOW 1.5 stage 1 ckd  . Colon cancer ([place]) DX 2019   SURGERY DONE  . Complication of anesthesia    AFTER HEART TRANSPLANT TOOK 2 YEARS TO GET BACK TO SELF; patient does not want aneshesia for endoscopy procedures   . Complication of anesthesia    likes propofol wants as few of anesthesia drugs as possible  . Depression    SITUATIONAL  . GERD (gastroesophageal reflux disease)   . Gout yrs ago  . History of chemotherapy 1982 for hodgkins   DAMAGED HEART MUSCLE WITH RADIATION ALSO  . History of kidney stones   . History of radiation therapy 1982   for hodgkins lymphoma  . Hodgkin's lymphoma ([place]) 1980   IIIB. 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  . Hypothyroidism   . Pneumonia FROM CHF 2016   recurrent pneumonia sedf.  rad tx for lymphoma  . Skin abnormality    right chest small area with squamous cell area removed 3 weeks ago as of 07-27-2020  . Skin cancer    AREAS REMOVED FROM [facility], CHECK AND BACK AND HEAD SEPT 2019  . Status post heart transplantation ([place]) 2017   caused by chemo  drug adriomycin    Past Surgical History:  Procedure Laterality Date  . BIOPSY  07/02/2018   Procedure: BIOPSY;  Surgeon: Carol Ada, MD;  Location: [facility];  Service: Endoscopy;;  . BIOPSY  05/20/2019   Procedure: BIOPSY;  Surgeon: Carol Ada, MD;  [facility];  Service: Endoscopy;;  . CARDIAC CATHETERIZATION N/A 04/20/2015   Procedure: Right/Left Heart Cath and Coronary Angiography;  Surgeon: Jolaine Artist, MD;  Location: [place] [facility];  Service: Cardiovascular;  Laterality: N/A;  . CARDIAC CATHETERIZATION  10-11-15   pt states routine heart cath to be done at [facility]   . CARDIAC CATHETERIZATION N/A 04/02/2016   Procedure: Right Heart Cath;  Surgeon: Jolaine Artist, MD;  Location: [place] [facility];  Service: Cardiovascular;  Laterality: N/A;  . CARDIAC CATHETERIZATION  JULY 2019 AT [facility]  . COLONOSCOPY N/A 07/02/2018   Procedure: COLONOSCOPY;  Surgeon: Carol Ada, MD;  [facility];  Service: Endoscopy;  Laterality: N/A;  . COLONOSCOPY N/A 08/05/2019   Procedure: COLONOSCOPY;  Surgeon: Carol Ada, MD;  [facility];  Service: Endoscopy;  Laterality: N/A;  . COLONOSCOPY WITH PROPOFOL N/A 08/03/2015   Procedure: COLONOSCOPY WITH PROPOFOL;  Surgeon: Carol Ada, MD;  [facility];  Service: Endoscopy;  Laterality: N/A;  wants to try to do procedure without sedation  . CYSTOSCOPY W/ RETROGRADES Bilateral 07/31/2020   Procedure: CYSTOSCOPY WITH RETROGRADE PYELOGRAM;  Surgeon: Remi Haggard, MD;  Location: [facility];  Service: Urology;  Laterality: Bilateral;  . CYSTOSCOPY WITH INSERTION OF UROLIFT N/A 10/18/2018   Procedure: CYSTOSCOPY WITH INSERTION OF UROLIFT;  Surgeon: Cleon Gustin, MD;  Location: [facility];  Service: Urology;  Laterality: N/A;  30 MINS  . CYSTOSCOPY WITH RETROGRADE PYELOGRAM, URETEROSCOPY AND STENT PLACEMENT Right 10/12/2015   Procedure: CYSTOSCOPY WITH RETROGRADE  PYELOGRAM, URETEROSCOPY AND STENT PLACEMENT;  Surgeon: Cleon Gustin, MD;  [facility];  Service: Urology;  Laterality: Right;  . ESOPHAGOGASTRODUODENOSCOPY    . ESOPHAGOGASTRODUODENOSCOPY (EGD) WITH PROPOFOL N/A 09/29/2019   Procedure: ESOPHAGOGASTRODUODENOSCOPY (EGD) WITH PROPOFOL;  Surgeon: Carol Ada, MD;  [facility];  Service: Endoscopy;  Laterality: N/A;  . EXPLORATORY LAPAROTOMY  1982   staging laparotomy  . FLEXIBLE SIGMOIDOSCOPY N/A 05/20/2019   Procedure: FLEXIBLE SIGMOIDOSCOPY;  Surgeon: Carol Ada, MD;  [facility];  Service: Endoscopy;  Laterality: N/A;  . FLEXIBLE SIGMOIDOSCOPY N/A 04/20/2020   Procedure: FLEXIBLE SIGMOIDOSCOPY;  Surgeon: Carol Ada, MD;  [facility];  Service: Endoscopy;  Laterality: N/A;  . gynemastia Bilateral   . HEART TRANSPLANT  06/20/2016   at [facility]  . HOT HEMOSTASIS N/A 07/02/2018   Procedure: HOT HEMOSTASIS (ARGON PLASMA COAGULATION/BICAP);  Surgeon: Carol Ada, MD;  Location: [facility];  Service: Endoscopy;  Laterality: N/A;  . LUMBAR LAMINECTOMY/DECOMPRESSION MICRODISCECTOMY Right 01/04/2016   Procedure: Microdiscectomy Lumbar four Lumbar five right;  Surgeon: Eustace Moore, MD;  Location: [facility];  Service: Neurosurgery;  Laterality: Right;  . neck biopses     x5  . 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 POLYPECTOMY  07/02/2018   Procedure: POLYPECTOMY;  Surgeon: Carol Ada, MD;  [facility];  Service: Endoscopy;;  . POLYPECTOMY  08/05/2019   Procedure: POLYPECTOMY;  Surgeon: Carol Ada, MD;  [facility];  Service: Endoscopy;;  . POLYPECTOMY  04/20/2020   Procedure: POLYPECTOMY;  Surgeon: Carol Ada, MD;  [facility];  Service: Endoscopy;;  . PORTACATH PLACEMENT     06-29-15 inserted, now removed.  . SPLENECTOMY, TOTAL  1981  . STONE EXTRACTION WITH BASKET Right 10/12/2015   Procedure: STONE EXTRACTION WITH  BASKET;  Surgeon: Cleon Gustin, MD;  [facility];  Service: Urology;  Laterality: Right;  . SUBMUCOSAL INJECTION  07/02/2018   Procedure: SUBMUCOSAL INJECTION;  Surgeon: Carol Ada, MD;  [facility];  Service: Endoscopy;;  . TONSILLECTOMY  as child  . TRANSURETHRAL RESECTION OF BLADDER TUMOR N/A 07/31/2020   Procedure: TRANSURETHRAL RESECTION OF BLADDER TUMOR (TURBT) FULGERATION;  Surgeon: Remi Haggard, MD;  [facility];  Service: Urology;  Laterality: N/A;  . VARICELE REPAIR  40 YRS AGO    Family History  Problem Relation Age of Onset  . Hyperthyroidism Mother   . Insomnia Mother   . Hypertension Father   . Depression Father   . Insomnia Father     Social History:  reports that he has never smoked. He has never used smokeless tobacco. He reports that he does not drink alcohol and does not use drugs.  Allergies:  Allergies  Allergen Reactions  . Digoxin Other (See Comments)    gynecomastia   .  Phencyclidine Other (See Comments)    PCP derived antibiotic > Insomnia  . Spironolactone Other (See Comments)    Gynecomastia  . Lactose Intolerance (Gi) Diarrhea and Other (See Comments)    cramps  . Penicillins Other (See Comments)    Cramps in stomach Did it involve swelling of the face/tongue/throat, SOB, or low BP? No Did it involve sudden or severe rash/hives, skin peeling, or any reaction on the inside of your mouth or nose? No Did you need to seek medical attention at a hospital or doctor's office? No When did it last happen?unknown If all above answers are "NO", may proceed with cephalosporin use.     . Sulfur Rash    Medications:  Scheduled:  Continuous: . sodium chloride      No results found for this or any previous visit (from the past 24 hour(s)).   No results found.  ROS:  As stated above in the HPI otherwise negative.  Height 6\' 2"  (1.88 m), weight 99.8 kg.    PE: Gen: NAD, Alert and  Oriented HEENT:  [place]/AT, EOMI Neck: Supple, no LAD Lungs: CTA Bilaterally CV: RRR without M/G/R ABD: Soft, NTND, +BS Ext: No C/C/E  Assessment/Plan: 1) Chronic cough. 2) History of an LA Grade A esophagitis. 3) History of an early rectal cancer. 4) S/p heart transplant.  Plan: 1) EGD and colonoscopy.  Rissa Turley D 08/10/2020, 7:19 AM

## 2020-08-10 NOTE — Op Note (Signed)
[facility] Patient Name: Glenn Gonzalez Procedure Date: 08/10/2020 MRN: [id number] Attending MD: Carol Ada , MD Date of Birth: [birth year] CSN: [id number] Age: 59 Admit Type: Outpatient Procedure:                Upper GI endoscopy Indications:              Esophageal reflux, Chronic cough Providers:                Carol Ada, MD, Doristine Johns, RN, Cletis [place], Technician Referring MD:              Medicines:                Propofol per Anesthesia Complications:            No immediate complications. Estimated Blood Loss:     Estimated blood loss: none. Procedure:                Pre-Anesthesia Assessment:                           - Prior to the procedure, a History and Physical                            was performed, and patient medications and                            allergies were reviewed. The patient's tolerance of                            previous anesthesia was also reviewed. The risks                            and benefits of the procedure and the sedation                            options and risks were discussed with the patient.                            All questions were answered, and informed consent                            was obtained. Prior Anticoagulants: The patient has                            taken no previous anticoagulant or antiplatelet                            agents. ASA Grade Assessment: III - A patient with                            severe systemic disease. After reviewing the risks  and benefits, the patient was deemed in                            satisfactory condition to undergo the procedure.                           - Sedation was administered by an anesthesia                            professional. Deep sedation was attained.                           After obtaining informed consent, the endoscope was                            passed under direct  vision. Throughout the                            procedure, the patient's blood pressure, pulse, and                            oxygen saturations were monitored continuously. The                            PCF-H190DL ([id number]) Olympus pediatric colonscope                            was introduced through the mouth, and advanced to                            the second part of duodenum. The upper GI endoscopy                            was accomplished without difficulty. The patient                            tolerated the procedure well. Scope In: Scope Out: Findings:      The esophagus was normal.      Multiple 3 to 7 mm sessile polyps with no bleeding and no stigmata of       recent bleeding were found in the gastric fundus and in the gastric body.      The examined duodenum was normal.      There was a significant amount of refluxate in the esophagus. Impression:               - Normal esophagus.                           - Multiple gastric polyps.                           - Normal examined duodenum.                           - No specimens collected. Moderate Sedation:      Not Applicable -  Patient had care per Anesthesia. Recommendation:           - Patient has a contact number available for                            emergencies. The signs and symptoms of potential                            delayed complications were discussed with the                            patient. Return to normal activities tomorrow.                            Written discharge instructions were provided to the                            patient.                           - Resume previous diet.                           - Continue present medications.                           - Trial of Aciphex BID.                           - Tessalon pearles and/or gabapentin can also be                            tried to treat the cough. Procedure Code(s):        --- Professional ---                            [phone], Esophagogastroduodenoscopy, flexible,                            transoral; diagnostic, including collection of                            specimen(s) by brushing or washing, when performed                            (separate procedure) Diagnosis Code(s):        --- Professional ---                           K31.7, Polyp of stomach and duodenum                           K21.9, Gastro-esophageal reflux disease without                            esophagitis  R05, Cough CPT copyright 2019 American Medical Association. All rights reserved. The codes documented in this report are preliminary and upon coder review may  be revised to meet current compliance requirements. Carol Ada, MD Carol Ada, MD 08/10/2020 9:32:01 AM This report has been signed electronically. Number of Addenda: 0

## 2020-08-10 NOTE — Anesthesia Preprocedure Evaluation (Addendum)
Anesthesia Evaluation  Patient identified by MRN, date of birth, ID band  Reviewed: Allergy & Precautions, NPO status , Patient's Chart, lab work & pertinent test results  Airway Mallampati: II  TM Distance: >3 FB Neck ROM: Full    Dental no notable dental hx.    Pulmonary    breath sounds clear to auscultation       Cardiovascular  Rhythm:Regular Rate:Normal  H/o heart transplant for EF 15%  2018 Echo: Left ventricle: The cavity size was normal. Wall thickness was  normal. Systolic function was normal. The estimated ejection  fraction was in the range of 55% to 60%. Wall motion was normal;  there were no regional wall motion abnormalities. Left  ventricular diastolic function parameters were normal.  - Mitral valve: Mild prolapse, involving the anterior leaflet.  - Pulmonary arteries: Systolic pressure was mildly increased. PA  peak pressure: 36 mm Hg (S).    Neuro/Psych PSYCHIATRIC DISORDERS Depression    GI/Hepatic GERD  ,  Endo/Other  Hypothyroidism   Renal/GU Renal InsufficiencyRenal disease  negative genitourinary   Musculoskeletal negative musculoskeletal ROS (+)   Abdominal Normal abdominal exam  (+)   Peds  Hematology  (+) anemia ,   Anesthesia Other Findings Colon cancer history Skin cancer hodgkins   Reproductive/Obstetrics                            Anesthesia Physical Anesthesia Plan  ASA: III  Anesthesia Plan: MAC   Post-op Pain Management:    Induction:   PONV Risk Score and Plan: 1 and Propofol infusion, TIVA and Treatment may vary due to age or medical condition  Airway Management Planned: Simple Face Mask  Additional Equipment:   Intra-op Plan:   Post-operative Plan:   Informed Consent: I have reviewed the patients History and Physical, chart, labs and discussed the procedure including the risks, benefits and alternatives for the proposed  anesthesia with the patient or authorized representative who has indicated his/her understanding and acceptance.       Plan Discussed with:   Anesthesia Plan Comments:        Anesthesia Quick Evaluation

## 2020-08-13 ENCOUNTER — Other Ambulatory Visit: Payer: Self-pay | Admitting: Urology

## 2020-08-13 LAB — SURGICAL PATHOLOGY

## 2020-08-14 ENCOUNTER — Encounter (HOSPITAL_COMMUNITY): Payer: Self-pay | Admitting: Gastroenterology

## 2020-08-17 ENCOUNTER — Telehealth: Payer: Self-pay | Admitting: *Deleted

## 2020-08-17 NOTE — Telephone Encounter (Signed)
Patient called - has appt 10/1 for labs and appt with Dr. Irene Limbo. Feeling fatigues and sometimes fells short of breath. Thinks his iron level is dropping and asked if he could get labs checked sooner. Dr. Irene Limbo informed.  Contacted patient - per Dr. Irene Limbo, can come next week for labs. Schedule message sent. Patient expects call from Scheduling.

## 2020-08-20 ENCOUNTER — Other Ambulatory Visit: Payer: Self-pay

## 2020-08-20 ENCOUNTER — Inpatient Hospital Stay: Payer: 59 | Attending: Hematology

## 2020-08-20 DIAGNOSIS — D509 Iron deficiency anemia, unspecified: Secondary | ICD-10-CM | POA: Diagnosis present

## 2020-08-20 DIAGNOSIS — D500 Iron deficiency anemia secondary to blood loss (chronic): Secondary | ICD-10-CM

## 2020-08-20 LAB — CBC WITH DIFFERENTIAL/PLATELET
Abs Immature Granulocytes: 0.05 10*3/uL (ref 0.00–0.07)
Basophils Absolute: 0 10*3/uL (ref 0.0–0.1)
Basophils Relative: 0 %
Eosinophils Absolute: 0.3 10*3/uL (ref 0.0–0.5)
Eosinophils Relative: 4 %
HCT: 45.7 % (ref 39.0–52.0)
Hemoglobin: 14.8 g/dL (ref 13.0–17.0)
Immature Granulocytes: 1 %
Lymphocytes Relative: 11 %
Lymphs Abs: 1 10*3/uL (ref 0.7–4.0)
MCH: 31.2 pg (ref 26.0–34.0)
MCHC: 32.4 g/dL (ref 30.0–36.0)
MCV: 96.2 fL (ref 80.0–100.0)
Monocytes Absolute: 1.1 10*3/uL — ABNORMAL HIGH (ref 0.1–1.0)
Monocytes Relative: 12 %
Neutro Abs: 6.8 10*3/uL (ref 1.7–7.7)
Neutrophils Relative %: 72 %
Platelets: 399 10*3/uL (ref 150–400)
RBC: 4.75 MIL/uL (ref 4.22–5.81)
RDW: 14.6 % (ref 11.5–15.5)
WBC: 9.4 10*3/uL (ref 4.0–10.5)
nRBC: 0 % (ref 0.0–0.2)

## 2020-08-20 LAB — CMP (CANCER CENTER ONLY)
ALT: 13 U/L (ref 0–44)
AST: 15 U/L (ref 15–41)
Albumin: 3.4 g/dL — ABNORMAL LOW (ref 3.5–5.0)
Alkaline Phosphatase: 77 U/L (ref 38–126)
Anion gap: 6 (ref 5–15)
BUN: 20 mg/dL (ref 6–20)
CO2: 27 mmol/L (ref 22–32)
Calcium: 9.4 mg/dL (ref 8.9–10.3)
Chloride: 104 mmol/L (ref 98–111)
Creatinine: 1.52 mg/dL — ABNORMAL HIGH (ref 0.61–1.24)
GFR, Est AFR Am: 58 mL/min — ABNORMAL LOW (ref >60)
GFR, Estimated: 50 mL/min — ABNORMAL LOW (ref >60)
Glucose, Bld: 91 mg/dL (ref 70–99)
Potassium: 4.1 mmol/L (ref 3.5–5.1)
Sodium: 137 mmol/L (ref 135–145)
Total Bilirubin: 0.7 mg/dL (ref 0.3–1.2)
Total Protein: 6.9 g/dL (ref 6.5–8.1)

## 2020-08-20 LAB — IRON AND TIBC
Iron: 89 ug/dL (ref 42–163)
Saturation Ratios: 37 % (ref 20–55)
TIBC: 243 ug/dL (ref 202–409)
UIBC: 154 ug/dL (ref 117–376)

## 2020-08-20 LAB — FERRITIN: Ferritin: 287 ng/mL (ref 24–336)

## 2020-08-22 NOTE — Patient Instructions (Addendum)
DUE TO COVID-19 ONLY ONE VISITOR IS ALLOWED TO COME WITH YOU AND STAY IN THE WAITING ROOM ONLY DURING PRE OP AND PROCEDURE DAY OF SURGERY. THE 1 VISITOR  MAY VISIT WITH YOU AFTER SURGERY IN YOUR PRIVATE ROOM DURING VISITING HOURS ONLY!  YOU NEED TO HAVE A COVID 19 TEST ON_9/24______ @_12 :50______, THIS TEST MUST BE DONE BEFORE SURGERY,  COVID TESTING SITE [place] [address], IT IS ON THE RIGHT GOING OUT [address] APPROXIMATELY  2 MINUTES PAST [employer] ON THE RIGHT. ONCE YOUR COVID TEST IS COMPLETED,  PLEASE BEGIN THE QUARANTINE INSTRUCTIONS AS OUTLINED IN YOUR HANDOUT.                Glenn Gonzalez    Your procedure is scheduled on: 08/28/20   Report to [facility] Main  Entrance   Report to admitting at   6:45 AM     Call this number if you have problems the morning of surgery [phone]    Remember: Do not eat food or drink liquids :After Midnight   BRUSH YOUR TEETH MORNING OF SURGERY AND RINSE YOUR MOUTH OUT, NO CHEWING GUM CANDY OR MINTS.     Take these medicines the morning of surgery with A SIP OF WATER: none                                  You may not have any metal on your body including               piercings  Do not wear jewelry, lotions, powders or deodorant                        Men may shave face and neck.   Do not bring valuables to the hospital. [place].  Contacts, dentures or bridgework may not be worn into surgery.       Patients discharged the day of surgery will not be allowed to drive home.   IF YOU ARE HAVING SURGERY AND GOING HOME THE SAME DAY, YOU MUST HAVE AN ADULT TO DRIVE YOU HOME AND BE WITH YOU FOR 24 HOURS  . YOU MAY GO HOME BY TAXI OR UBER OR ORTHERWISE, BUT AN ADULT MUST ACCOMPANY YOU HOME AND STAY WITH YOU FOR 24 HOURS.  Name and phone number of your driver:  Special Instructions: N/A              Please read over the following fact  sheets you were given: _____________________________________________________________________             [facility] - Preparing for Surgery  Before surgery, you can play an important role.   Because skin is not sterile, your skin needs to be as free of germs as possible .  You can reduce the number of germs on your skin by washing with CHG (chlorahexidine gluconate) soap before surgery.   CHG is an antiseptic cleaner which kills germs and bonds with the skin to continue killing germs even after washing. Please DO NOT use if you have an allergy to CHG or antibacterial soaps.   If your skin becomes reddened/irritated stop using the CHG and inform your nurse when you arrive at Short Stay.   You may shave your  face/neck.  Please follow these instructions carefully:  1.  Shower with CHG Soap the night before surgery and the  morning of Surgery.  2.  If you choose to wash your hair, wash your hair first as usual with your  normal  shampoo.  3.  After you shampoo, rinse your hair and body thoroughly to remove the  shampoo.                                        4.  Use CHG as you would any other liquid soap.  You can apply chg directly  to the skin and wash                       Gently with a scrungie or clean washcloth.  5.  Apply the CHG Soap to your body ONLY FROM THE NECK DOWN.   Do not use on face/ open                           Wound or open sores. Avoid contact with eyes, ears mouth and genitals (private parts).                       Wash face,  Genitals (private parts) with your normal soap.             6.  Wash thoroughly, paying special attention to the area where your surgery  will be performed.  7.  Thoroughly rinse your body with warm water from the neck down.  8.  DO NOT shower/wash with your normal soap after using and rinsing off  the CHG Soap.             9.  Pat yourself dry with a clean towel.            10.  Wear clean pajamas.            11.  Place clean sheets on your  bed the night of your first shower and do not  sleep with pets. Day of Surgery : Do not apply any lotions/deodorants the morning of surgery.  Please wear clean clothes to the hospital/surgery center.  FAILURE TO FOLLOW THESE INSTRUCTIONS MAY RESULT IN THE CANCELLATION OF YOUR SURGERY PATIENT SIGNATURE_________________________________  NURSE SIGNATURE__________________________________  ________________________________________________________________________

## 2020-08-24 ENCOUNTER — Encounter (HOSPITAL_COMMUNITY): Payer: Self-pay

## 2020-08-24 ENCOUNTER — Other Ambulatory Visit: Payer: Self-pay

## 2020-08-24 ENCOUNTER — Encounter (HOSPITAL_COMMUNITY)
Admission: RE | Admit: 2020-08-24 | Discharge: 2020-08-24 | Disposition: A | Discharge status: Home / Self Care | Payer: 59 | Source: Ambulatory Visit | Attending: Urology | Admitting: Urology

## 2020-08-24 ENCOUNTER — Other Ambulatory Visit (HOSPITAL_COMMUNITY)
Admission: RE | Admit: 2020-08-24 | Discharge: 2020-08-24 | Disposition: A | Discharge status: Home / Self Care | Payer: 59 | Source: Ambulatory Visit | Attending: Urology | Admitting: Urology

## 2020-08-24 DIAGNOSIS — Z8571 Personal history of Hodgkin lymphoma: Secondary | ICD-10-CM | POA: Diagnosis not present

## 2020-08-24 DIAGNOSIS — Z79890 Hormone replacement therapy (postmenopausal): Secondary | ICD-10-CM | POA: Insufficient documentation

## 2020-08-24 DIAGNOSIS — M109 Gout, unspecified: Secondary | ICD-10-CM | POA: Insufficient documentation

## 2020-08-24 DIAGNOSIS — Z85038 Personal history of other malignant neoplasm of large intestine: Secondary | ICD-10-CM | POA: Diagnosis not present

## 2020-08-24 DIAGNOSIS — I4891 Unspecified atrial fibrillation: Secondary | ICD-10-CM | POA: Insufficient documentation

## 2020-08-24 DIAGNOSIS — Z950 Presence of cardiac pacemaker: Secondary | ICD-10-CM | POA: Diagnosis not present

## 2020-08-24 DIAGNOSIS — C679 Malignant neoplasm of bladder, unspecified: Secondary | ICD-10-CM | POA: Insufficient documentation

## 2020-08-24 DIAGNOSIS — I509 Heart failure, unspecified: Secondary | ICD-10-CM | POA: Diagnosis not present

## 2020-08-24 DIAGNOSIS — Z941 Heart transplant status: Secondary | ICD-10-CM | POA: Insufficient documentation

## 2020-08-24 DIAGNOSIS — Z7952 Long term (current) use of systemic steroids: Secondary | ICD-10-CM | POA: Diagnosis not present

## 2020-08-24 DIAGNOSIS — Z20822 Contact with and (suspected) exposure to covid-19: Secondary | ICD-10-CM | POA: Insufficient documentation

## 2020-08-24 DIAGNOSIS — Z79899 Other long term (current) drug therapy: Secondary | ICD-10-CM | POA: Insufficient documentation

## 2020-08-24 DIAGNOSIS — E039 Hypothyroidism, unspecified: Secondary | ICD-10-CM | POA: Diagnosis not present

## 2020-08-24 DIAGNOSIS — I2510 Atherosclerotic heart disease of native coronary artery without angina pectoris: Secondary | ICD-10-CM | POA: Diagnosis not present

## 2020-08-24 DIAGNOSIS — N181 Chronic kidney disease, stage 1: Secondary | ICD-10-CM | POA: Diagnosis not present

## 2020-08-24 DIAGNOSIS — Z01812 Encounter for preprocedural laboratory examination: Secondary | ICD-10-CM | POA: Diagnosis not present

## 2020-08-24 DIAGNOSIS — Z85828 Personal history of other malignant neoplasm of skin: Secondary | ICD-10-CM | POA: Diagnosis not present

## 2020-08-24 LAB — SARS CORONAVIRUS 2 (TAT 6-24 HRS): SARS Coronavirus 2: NEGATIVE

## 2020-08-24 NOTE — Progress Notes (Signed)
Anesthesia Chart Review   Case: [id number] Date/Time: 08/28/20 0830   Procedures:      TRANSURETHRAL RESECTION OF BLADDER TUMOR (TURBT) (N/A ) - 30 MINS     CYSTOSCOPY (N/A )   Anesthesia type: General   Pre-op diagnosis: BLADDER CANCER   Location: [facility]OM 03 / [facility]   Surgeons: Remi Haggard, MD      DISCUSSION:59 y.o. never smoker with h/o GERD, Hodgkin's lymphoma, CKD, colon cancer, non ischemic CM s/p heart transplant 2017, bladder cancer scheduled for above procedure 08/28/2020 with Dr. Harold Barban.   S/p heart transplant 2017 follows with [facility].  Last seen 06/01/2020.  Per OV note, no evidence of allograft dysfunction. Echo done at this visit with normal EF, no valvular stenosis, mild TR.  CTA with minimal CAD.    H/o atrial fib and V tach, none since transplant.   S/p TURBT 07/31/2020 with no anesthesia complications.   Anticipate pt can proceed with planned procedure barring acute status change.   VS: There were no vitals taken for this visit.  PROVIDERS: Lajean Manes, MD is PCP   Tylene Fantasia, MD is Cardiologist with [facility] LABS: Labs reviewed: Acceptable for surgery. (all labs ordered are listed, but only abnormal results are displayed)  Labs Reviewed - No data to display   IMAGES:   EKG: 07/31/2020 Rate 88 bpm  NSR  Left atrial enlargement  Incomplete right bundle branch block  Rightward axis Consider right ventricular hypertrophy Cannot rule out anterior infarct, age undetermined  No significant change since last tracing   CV: Echo 06/01/2020 INTERPRETATION ---------------------------------------------------------------  Closest EF: >55% (Estimated)   NORMAL LEFT VENTRICULAR SYSTOLIC FUNCTION  NORMAL LA PRESSURES WITH NORMAL DIASTOLIC FUNCTION  NORMAL RIGHT VENTRICULAR SYSTOLIC FUNCTION  VALVULAR REGURGITATION: TRIVIAL MR, TRIVIAL PR, MILD TR  NO VALVULAR STENOSIS  MILD TR   CT heart  angiogram with FFRCT 06/01/2020 Impression:  1. Normal origin and course of coronary arteries  2. Minimal calcific atherosclerotic disease of the LAD near the takeoff of  D3 and within the proximal D3 vessel with luminal narrowing 1-24%. CAD-RADS  1         3. CTFFR Analysis: CT FFR will not be performed for this study  Past Medical History:  Diagnosis Date  . A-fib ([place])    IN PAST with old heart   . Bladder tumor   . Bulging lumbar disc    states " i threw my back out on tuesday 6-16, i ahve a bulging disc, but i can lie on my back a side "   . Chronic fatigue   . Chronic kidney disease    CREATININE NOW 1.5 stage 1 ckd  . Colon cancer ([place]) DX 2019   SURGERY DONE  . Complication of anesthesia    AFTER HEART TRANSPLANT TOOK 2 YEARS TO GET BACK TO SELF; patient does not want aneshesia for endoscopy procedures   . Complication of anesthesia    likes propofol wants as few of anesthesia drugs as possible  . Depression    SITUATIONAL  . GERD (gastroesophageal reflux disease)   . Gout yrs ago  . History of chemotherapy 1982 for hodgkins   DAMAGED HEART MUSCLE WITH RADIATION ALSO  . History of kidney stones   . History of radiation therapy 1982   for hodgkins lymphoma  . Hodgkin's lymphoma ([place]) 1980   IIIB. 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  . Hypothyroidism   .  Pneumonia FROM CHF 2016   recurrent pneumonia sedf.  rad tx for lymphoma  . Skin abnormality    right chest small area with squamous cell area removed 3 weeks ago as of 07-27-2020  . Skin cancer    AREAS REMOVED FROM [facility], CHECK AND BACK AND HEAD SEPT 2019  . Status post heart transplantation ([place]) 2017   caused by chemo drug adriomycin    Past Surgical History:  Procedure Laterality Date  . BIOPSY  07/02/2018   Procedure: BIOPSY;  Surgeon: Carol Ada, MD;  [facility];  Service: Endoscopy;;  . BIOPSY  05/20/2019   Procedure: BIOPSY;  Surgeon: Carol Ada, MD;  [facility];  Service: Endoscopy;;  . CARDIAC CATHETERIZATION N/A 04/20/2015   Procedure: Right/Left Heart Cath and Coronary Angiography;  Surgeon: Jolaine Artist, MD;  Location: [place] [facility];  Service: Cardiovascular;  Laterality: N/A;  . CARDIAC CATHETERIZATION  10-11-15   pt states routine heart cath to be done at [facility]   . CARDIAC CATHETERIZATION N/A 04/02/2016   Procedure: Right Heart Cath;  Surgeon: Jolaine Artist, MD;  Location: [place] [facility];  Service: Cardiovascular;  Laterality: N/A;  . CARDIAC CATHETERIZATION  JULY 2019 AT [facility]  . COLONOSCOPY N/A 07/02/2018   Procedure: COLONOSCOPY;  Surgeon: Carol Ada, MD;  [facility];  Service: Endoscopy;  Laterality: N/A;  . COLONOSCOPY N/A 08/05/2019   Procedure: COLONOSCOPY;  Surgeon: Carol Ada, MD;  [facility];  Service: Endoscopy;  Laterality: N/A;  . COLONOSCOPY WITH PROPOFOL N/A 08/03/2015   Procedure: COLONOSCOPY WITH PROPOFOL;  Surgeon: Carol Ada, MD;  [facility];  Service: Endoscopy;  Laterality: N/A;  wants to try to do procedure without sedation  . COLONOSCOPY WITH PROPOFOL N/A 08/10/2020   Procedure: COLONOSCOPY WITH PROPOFOL;  Surgeon: Carol Ada, MD;  [facility];  Service: Endoscopy;  Laterality: N/A;  . CYSTOSCOPY W/ RETROGRADES Bilateral 07/31/2020   Procedure: CYSTOSCOPY WITH RETROGRADE PYELOGRAM;  Surgeon: Remi Haggard, MD;  Location[facility];  Service: Urology;  Laterality: Bilateral;  . CYSTOSCOPY WITH INSERTION OF UROLIFT N/A 10/18/2018   Procedure: CYSTOSCOPY WITH INSERTION OF UROLIFT;  Surgeon: Cleon Gustin, MD;  Location: [facility];  Service: Urology;  Laterality: N/A;  30 MINS  . CYSTOSCOPY WITH RETROGRADE PYELOGRAM, URETEROSCOPY AND STENT PLACEMENT Right 10/12/2015   Procedure: CYSTOSCOPY WITH RETROGRADE PYELOGRAM, URETEROSCOPY AND STENT PLACEMENT;  Surgeon: Cleon Gustin, MD;  [facility];  Service:  Urology;  Laterality: Right;  . ESOPHAGOGASTRODUODENOSCOPY    . ESOPHAGOGASTRODUODENOSCOPY (EGD) WITH PROPOFOL N/A 09/29/2019   Procedure: ESOPHAGOGASTRODUODENOSCOPY (EGD) WITH PROPOFOL;  Surgeon: Carol Ada, MD;  [facility];  Service: Endoscopy;  Laterality: N/A;  . ESOPHAGOGASTRODUODENOSCOPY (EGD) WITH PROPOFOL N/A 08/10/2020   Procedure: ESOPHAGOGASTRODUODENOSCOPY (EGD) WITH PROPOFOL;  Surgeon: Carol Ada, MD;  [facility];  Service: Endoscopy;  Laterality: N/A;  . EXPLORATORY LAPAROTOMY  1982   staging laparotomy  . FLEXIBLE SIGMOIDOSCOPY N/A 05/20/2019   Procedure: FLEXIBLE SIGMOIDOSCOPY;  Surgeon: Carol Ada, MD;  [facility];  Service: Endoscopy;  Laterality: N/A;  . FLEXIBLE SIGMOIDOSCOPY N/A 04/20/2020   Procedure: FLEXIBLE SIGMOIDOSCOPY;  Surgeon: Carol Ada, MD;  [facility];  Service: Endoscopy;  Laterality: N/A;  . gynemastia Bilateral   . HEART TRANSPLANT  06/20/2016   at [facility]  . HOT HEMOSTASIS N/A 07/02/2018   Procedure: HOT HEMOSTASIS (ARGON PLASMA COAGULATION/BICAP);  Surgeon: Carol Ada, MD;  Location: [facility];  Service: Endoscopy;  Laterality: N/A;  . LUMBAR LAMINECTOMY/DECOMPRESSION MICRODISCECTOMY Right 01/04/2016   Procedure: Microdiscectomy Lumbar four Lumbar five right;  Surgeon: Eustace Moore, MD;  Location: [place] [facility];  Service: Neurosurgery;  Laterality: Right;  . neck biopses     x5  . 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 POLYPECTOMY  07/02/2018   Procedure: POLYPECTOMY;  Surgeon: Carol Ada, MD;  [facility];  Service: Endoscopy;;  . POLYPECTOMY  08/05/2019   Procedure: POLYPECTOMY;  Surgeon: Carol Ada, MD;  [facility];  Service: Endoscopy;;  . POLYPECTOMY  04/20/2020   Procedure: POLYPECTOMY;  Surgeon: Carol Ada, MD;  [facility];  Service: Endoscopy;;  . POLYPECTOMY  08/10/2020   Procedure: POLYPECTOMY;  Surgeon:  Carol Ada, MD;  [facility];  Service: Endoscopy;;  . PORTACATH PLACEMENT     06-29-15 inserted, now removed.  . SPLENECTOMY, TOTAL  1981  . STONE EXTRACTION WITH BASKET Right 10/12/2015   Procedure: STONE EXTRACTION WITH BASKET;  Surgeon: Cleon Gustin, MD;  [facility];  Service: Urology;  Laterality: Right;  . SUBMUCOSAL INJECTION  07/02/2018   Procedure: SUBMUCOSAL INJECTION;  Surgeon: Carol Ada, MD;  [facility];  Service: Endoscopy;;  . TONSILLECTOMY  as child  . TRANSURETHRAL RESECTION OF BLADDER TUMOR N/A 07/31/2020   Procedure: TRANSURETHRAL RESECTION OF BLADDER TUMOR (TURBT) FULGERATION;  Surgeon: Remi Haggard, MD;  Location: [facility];  Service: Urology;  Laterality: N/A;  . VARICELE REPAIR  40 YRS AGO    MEDICATIONS: . acetaminophen (TYLENOL) 650 MG CR tablet  . allopurinol (ZYLOPRIM) 100 MG tablet  . Azelastine-Fluticasone-NaCl 137-50 & 0.9 MCG/ACT-% KIT  . Calcium Carbonate-Vitamin D3 (RA CALCIUM 600/VITAMIN D-3) 600-400 MG-UNIT TABS  . cetirizine (ZYRTEC) 10 MG tablet  . ENVARSUS XR 0.75 MG TB24  . ENVARSUS XR 1 MG TB24  . levothyroxine (SYNTHROID, LEVOTHROID) 125 MCG tablet  . NON FORMULARY  . predniSONE (DELTASONE) 2.5 MG tablet  . RABEprazole (ACIPHEX) 20 MG tablet  . sirolimus (RAPAMUNE) 1 MG tablet  . trimethoprim (TRIMPEX) 100 MG tablet  . valACYclovir (VALTREX) 500 MG tablet   No current facility-administered medications for this encounter.    Glenn Felix, PA-C [facility] [phone]

## 2020-08-24 NOTE — Progress Notes (Signed)
COVID Vaccine Completed:Yes Date COVID Vaccine completed:2nd shot in April, booster 07/20/20 COVID vaccine manufacturer: Pfizer     PCP - Dr. Felipa Eth Cardiologist - [facility]  Chest x-ray - no EKG - 07/31/20- Epic Stress Test - 06/01/20-Care everywhere ECHO - 06/06/20 Cardiac Cath - 2019- for biopsies post transplant Pacemaker/ICD device last checked: Was removed with old heart.  Sleep Study - no CPAP -   Fasting Blood Sugar - NA Checks Blood Sugar _____ times a day  Blood Thinner Instructions:NA Aspirin Instructions: Last Dose:  Anesthesia review:   Patient denies shortness of breath, fever, cough and chest pain at PAT appointment yes   Patient verbalized understanding of instructions that were given to them at the PAT appointment. Patient was also instructed that they will need to review over the PAT instructions again at home before surgery. Pt reports fatigue from Chemo but no SOB climbing stairs, doing work or with ADLs He has been doing well since the transplant in 2017.

## 2020-08-27 NOTE — H&P (Signed)
HPI: have an enlarged prostate (S/P Surgery).  HPI: Glenn Gonzalez is a 59 year-old male established patient who is here for an enlarged prostate after surgical intervention.   It was performed 10/18/2018. He has not been treated with medications in the past for his lower urinary tract symptoms. He is on new medications for symptoms of prostate enlargement.   He does have an abnormal sensation when needing to urinate. He does have a good size and strength to his urinary stream. He does not have hesitancy or straining. He is not having problems with emptying his bladder well.   10/20/18: He is s/p Urolift procedure for BPH refractory to pharmacological therapy. He returns today for voiding trial. Overall, he states that he has been doing fairly well following his procedure. He states that his catheter has been draining well. He did have some painful urgency and spasm, which he used Myrbetriq for. This was helpful. His last dose was yesterday. He did have some hematuria initially, but this has cleared. He denies fevers, chills, nausea, vomiting, or constipation.   11/03/18: He returns today for follow up. Overall the states that he has been doing well since his procedure. He states that for the first few days he noted dramatic improvement in lower urinary tract symptoms. Today he continues to note improvement, thought not as much. He currently denies bothersome frequency, urgency, or nocturia. He denies straining. He states that he will have some hesitancy at times, however. He states he notices this more if he attempts to void prior to feeling that his bladder is full. He is no longer on Uroxatral or Proscar. He continues to have some intermittent suprapubic discomfort. He denies dysuria, gross hematuria, fever, or chills.   12/16/2018: After urolift he has a strong stream. improved hesitancy, less straining.   06/08/19: Patient with above-noted history. He also has history of chronic prostatitis. He  presents today with complaints of bladder pain. He states that his symptoms began about 2-3 days ago. He describes the pain is intermittent and relatively mild, but noticeable. He has not used anything for the pain. He denies dysuria or pain associated with voiding. He denies any significant changes in his urinary stream in continues to feel it has improved following Urolift. He did have 1 episode of hesitancy and straining last night, but voiding symptoms are stable today. He denies exacerbation of urgency or frequency. No gross hematuria, fevers, or chills.   07/29/2019: He developed a prostate infection and was treated with 2 course of antibiotics. He LUTS have resolved. He has occasional dysuria.   09/15/2019: His LUTS have returned to baseline. He has occasional hesitancy which is rare. No dysuria.   02/02/2020: Stream is ok. nocturia 0-1x. mild hesitancy.    CC: I have a prostate infection.  HPI: He has been treated with Uroxatral and Proscar. Nothing seems to make the symptoms worse. He has had recurrent prostate infections or chronic prostatitis. He has been on antibiotics for prostate infections previously.   He does not dribble at the end of urination. Patient denies getting up to urinate in the night. He has not had a PSA done.   09/15/2019: The patient was previously on doxycycline for chronic prostatitis which improve his symptoms   02/02/2020: LUTS are at baseline. he is on trimethoprim 100mg  qhs and no recent UTIs.   -07/05/20-patient with history of previous prostatitis in the past. Patient has significant complicated medical history with history of Hodgkin's lymphoma in the remote past which  required chemotherapy. 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 Also has history of uric acid stones and takes allopurinol on a daily basis for prevention. He presents today with history of a single episode of gross painless hematuria  proximally 1 week ago that happened during 1 void and then resolved. He has also had some intermittent right-sided "kidney pain" which is sort of stabbing in nature and intermittent. Denies any nausea vomiting or fever. Currently minimally symptomatic. He remains on trimethoprim suppression for chronic prostatitis as well. The patient is super anxious in terms of concern over possible cancer recurrence or new cancer.  -8/223/21-patient with history of gross hematuria as above. Underwent CT renal scan on 07/20/2020 showing essentially normal upper urinary tract except for small nonobstructing left renal calculus.. Here for cysto to assess bladder. CT report as below.   Cysto performed today and shows normal pendulous prostatic urethra. 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 Remainder of the bladder. Grossly normal. This area probably is a cm and half in size    CLINICAL DATA: Gross hematuria, history of non-Hodgkin's lymphoma,  status post splenectomy, history of colon cancer   EXAM:  CT ABDOMEN AND PELVIS WITHOUT AND WITH CONTRAST   TECHNIQUE:  Multidetector CT imaging of the abdomen and pelvis was performed  following the standard protocol before and following the bolus  administration of intravenous contrast.   CONTRAST: 100 mL Omnipaque 300 iodinated contrast IV   COMPARISON: 07/20/2018   FINDINGS:  Lower chest: No acute abnormality.   Hepatobiliary: No solid liver abnormality is seen. No gallstones,  gallbladder wall thickening, or biliary dilatation.   Pancreas: Surgical clips in the vicinity of the pancreatic head  (series 5, image 22). No pancreatic ductal dilatation or surrounding  inflammatory changes.   Spleen: Status post splenectomy.   Adrenals/Urinary Tract: Adrenal glands are unremarkable. Unchanged  cortical scarring of the superior pole of the left kidney (series   601, image 89). Small nonobstructive calculus of the inferior pole  of the left kidney (series 601, image 84). No ureteral calculus or  hydronephrosis. Bladder is unremarkable.   Stomach/Bowel: Stomach is within normal limits. Appendix appears  normal. No evidence of bowel wall thickening, distention, or  inflammatory changes.   Vascular/Lymphatic: Aortic atherosclerosis. No enlarged abdominal or  pelvic lymph nodes. Surgical clips in the left retroperitoneum  (series 8, image 43).   Reproductive: Metallic clips in the prostate (series 2, image 93).   Other: No abdominal wall hernia or abnormality. No abdominopelvic  ascites.   Musculoskeletal: No acute or significant osseous findings.   IMPRESSION:  1. Small nonobstructive calculus of the inferior pole of the left  kidney. No ureteral calculus or hydronephrosis.  2. Unchanged cortical scarring of the superior pole of the left  kidney.  3. No suspicious mass or filling defect of the urinary tract.  4. Status post splenectomy and lymph node dissection.  5. No evidence of mass or lymphadenopathy in the abdomen or pelvis.  6. Aortic Atherosclerosis (ICD10-I70.0).    Electronically Signed  By: Eddie Candle M.D.  On: 07/20/2020 19:25   -08/08/20-patient with history of bladder tumor status post recent cysto and TURBT on 07/31/2020. Here for postop follow-up. 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 The carcinoma focally invaded the lamina propria in the papillary stalk. Muscularis propria detrusor  muscle was unfortunately not present in the specimen. These were removed with deep cold cup biopsies and fulguration is a. Very superficial on the visualization at time of the procedure. There was no visible tumor remaining at the end of the case. No significant postoperative issues.     ALLERGIES: Lactose Penicillins Sulfa    MEDICATIONS: Allopurinol  Aciphex 20 mg  tablet, delayed release  Aspir 81 81 mg tablet, delayed release  Calcium + D  Envarsus Xr  Prednisone 2.5 mg tablet  Sirolimus 1 mg tablet  Synthroid  Trimethoprim 100 mg tablet 1 tablet PO Daily  Zyrtec 10 mg tablet Oral     GU PSH: Cystoscopy - 07/23/2020 Cystoscopy Insert Stent - 2016 Locm 300-399Mg /Ml Iodine,1Ml - 07/20/2020 Ureteroscopic stone removal - 2016 UROLIFT - 10/18/2018       PSH Notes: Cystoscopy With Ureteroscopy With Removal Of Calculus, Cystoscopy With Insertion Of Ureteral Stent Right, Cholecystectomy, Exploratory Laparotomy, Neck Surgery, Breast Surgery Mastectomy For Gynecomastia, Tonsillectomy, Pacemaker - Pulse Generator Replacement, Cath Stent Placement, Percutaneous Portal Vein Catheter Placement  Colon cancer removed July of 2019.   NON-GU PSH: Cholecystectomy (open) - 2016 Exploratory Laparotomy - 2016 Insert Catheter; Vein - 2016 REMOVAL OF BREAST TISSUE - 2016 Remove Tonsils - 2016 Transplant Heart - 2017     GU PMH: Bladder tumor/neoplasm - 07/23/2020 History of urolithiasis - 07/05/2020 BPH w/LUTS - 02/02/2020, - 09/15/2019, - 07/29/2019, - 06/08/2019, - 2020, - 11/03/2018, - 10/20/2018, - 08/12/2018, - 2019, - 2019, - 2019, - 2018 Chronic prostatitis - 02/02/2020, - 09/15/2019, - 07/29/2019, - 06/08/2019 Elevated PSA - 09/15/2019, - 2018 Weak Urinary Stream - 08/12/2018, - 2019 Gross hematuria - 2019 Renal calculus, Nephrolithiasis - 2016 Urinary Calculus, Unspec, Uric acid urolithiasis - 2016      PMH Notes: Abnormally Low RBC Count   NON-GU PMH: Encounter for general adult medical examination without abnormal findings, Encounter for preventive health examination - 2016 Personal history of Hodgkin lymphoma, History of hodgkin's lymphoma - 2016 Personal history of other diseases of the circulatory system, History of heart failure - 2016 Personal history of other diseases of the digestive system, History of esophageal reflux - 2016, History of gastric  ulcer, - 2016 Personal history of other endocrine, nutritional and metabolic disease, History of hypothyroidism - 2016 Colon Cancer, History Heart failure, unspecified Heart transplant status Hypothyroidism    FAMILY HISTORY: Death - Father Heart Disease - Father Kidney Stones - Runs In Family Lung Cancer - Father   SOCIAL HISTORY: Marital Status: Married Preferred Language: English; Ethnicity: Not Hispanic Or Latino; Race: White Current Smoking Status: Patient has never smoked.   Tobacco Use Assessment Completed: Used Tobacco in last 30 days? Has never drank.  Does not drink caffeine. Patient's occupation is/was retired.     Notes: Married, Caffeine use, Retired, Never a smoker, Number of children, Alcohol use   REVIEW OF SYSTEMS:    GU Review Male:   Patient denies frequent urination, hard to postpone urination, burning/ pain with urination, get up at night to urinate, leakage of urine, stream starts and stops, trouble starting your stream, have to strain to urinate , erection problems, and penile pain.  Gastrointestinal (Upper):   Patient denies nausea, vomiting, and indigestion/ heartburn.  Gastrointestinal (Lower):   Patient denies diarrhea and constipation.  Constitutional:   Patient denies fever, night sweats, weight loss, and fatigue.  Skin:   Patient denies skin rash/ lesion and itching.  Eyes:   Patient denies blurred vision and double  vision.  Ears/ Nose/ Throat:   Patient denies sore throat and sinus problems.  Hematologic/Lymphatic:   Patient denies swollen glands and easy bruising.  Cardiovascular:   Patient denies leg swelling and chest pains.  Respiratory:   Patient denies cough and shortness of breath.  Endocrine:   Patient denies excessive thirst.  Musculoskeletal:   Patient denies back pain and joint pain.  Neurological:   Patient denies headaches and dizziness.  Psychologic:   Patient denies depression and anxiety.   VITAL SIGNS:      08/08/2020 09:43 AM   Weight 220 lb / 99.79 kg  Height 74 in / 187.96 cm  BP 103/68 mmHg  Pulse 98 /min  Temperature 97.1 F / 36.1 C  BMI 28.2 kg/m   GU PHYSICAL EXAMINATION:    Anus and Perineum: No hemorrhoids. No anal stenosis. No rectal fissure, no anal fissure. No edema, no dimple, no perineal tenderness, no anal tenderness.  Scrotum: No lesions. No edema. No cysts. No warts.  Epididymides: Right: no spermatocele, no masses, no cysts, no tenderness, no induration, no enlargement. Left: no spermatocele, no masses, no cysts, no tenderness, no induration, no enlargement.  Testes: No tenderness, no swelling, no enlargement left testes. No tenderness, no swelling, no enlargement right testes. Normal location left testes. Normal location right testes. No mass, no cyst, no varicocele, no hydrocele left testes. No mass, no cyst, no varicocele, no hydrocele right testes.  Urethral Meatus: Normal size. No lesion, no wart, no discharge, no polyp. Normal location.  Penis: Circumcised, no warts, no cracks. No dorsal Peyronie's plaques, no left corporal Peyronie's plaques, no right corporal Peyronie's plaques, no scarring, no warts. No balanitis, no meatal stenosis.  Prostate: 40 gram or 2+ size. Left lobe normal consistency, right lobe normal consistency. Symmetrical lobes. No prostate nodule. Left lobe no tenderness, right lobe no tenderness.  Seminal Vesicles: Nonpalpable.  Sphincter Tone: Normal sphincter. No rectal tenderness. No rectal mass.    MULTI-SYSTEM PHYSICAL EXAMINATION:    Constitutional: Well-nourished. No physical deformities. Normally developed. Good grooming.  Neck: Neck symmetrical, not swollen. Normal tracheal position.  Respiratory: No labored breathing, no use of accessory muscles.   Cardiovascular: Normal temperature, normal extremity pulses, no swelling, no varicosities.  Lymphatic: No enlargement of neck, axillae, groin.  Skin: No paleness, no jaundice, no cyanosis. No lesion, no ulcer, no  rash.  Neurologic / Psychiatric: Oriented to time, oriented to place, oriented to person. No depression, no anxiety, no agitation.  Gastrointestinal: No mass, no tenderness, no rigidity, non obese abdomen.  Eyes: Normal conjunctivae. Normal eyelids.  Ears, Nose, Mouth, and Throat: Left ear no scars, no lesions, no masses. Right ear no scars, no lesions, no masses. Nose no scars, no lesions, no masses. Normal hearing. Normal lips.  Musculoskeletal: Normal gait and station of head and neck.     Complexity of Data:  Source Of History:  Patient  Records Review:   Previous Doctor Records, Previous Hospital Records, Previous Patient Records  Urine Test Review:   Urinalysis   09/08/19  PSA  Total PSA 2.65 ng/mL    PROCEDURES:          Urinalysis w/Scope Dipstick Dipstick Cont'd Micro  Color: Yellow Bilirubin: Neg mg/dL WBC/hpf: 20 - 40/hpf  Appearance: Clear Ketones: Neg mg/dL RBC/hpf: 20 - 40/hpf  Specific Gravity: 1.020 Blood: 3+ ery/uL Bacteria: Mod (26-50/hpf)  pH: 5.5 Protein: 3+ mg/dL Cystals: NS (Not Seen)  Glucose: Neg mg/dL Urobilinogen: 0.2 mg/dL Casts: NS (Not Seen)  Nitrites: Neg Trichomonas: Not Present    Leukocyte Esterase: 2+ leu/uL Mucous: Present      Epithelial Cells: 0 - 5/hpf      Yeast: NS (Not Seen)      Sperm: Not Present    ASSESSMENT:      ICD-10 Details  1 GU:   Bladder Cancer, Unspec - C67.9 Undiagnosed New Problem   PLAN:           Document Letter(s):  Created for Patient: Clinical Summary         Notes:   We discussed surgical pathology in detail. I told him based on pathology being high-grade into the lamina propria but no muscularis present in the specimen need re-resection to exclude persistent tumor. Also with high-grade nature of the disease likely need intravesical BCG induction therapy. Will schedule accordingly for re-resection of the bladder neck area/TURBT site in the near future.  TURBT consent: I have discussed with the patient the  risks, benefits of TURBT which include but are not limited to: Bleeding, infection, damage to the bladder with potential perforation of the bladder, damage to surrounding organs, possible need for further procedures including open repair and catheterization, possibility of nonhealing area within the bladder, urgency, frequency which may be refractory to medications. I pointed out that in some occasions after resection of the bladder tumor, mitomycin-C chemotherapy may be instilled into the bladder. The risks associated with this therapy include but are not limited to: Refractory or new onset urgency, frequency, dysuria, infrequently severe systemic side effects secondary to mitomycin-C. After full discussion of the risks, benefits and alternatives, the patient has consented to the above procedure and desires to proceed.

## 2020-08-28 ENCOUNTER — Ambulatory Visit (HOSPITAL_COMMUNITY): Payer: 59 | Admitting: Certified Registered"

## 2020-08-28 ENCOUNTER — Other Ambulatory Visit: Payer: Self-pay

## 2020-08-28 ENCOUNTER — Ambulatory Visit (HOSPITAL_COMMUNITY): Payer: 59 | Admitting: Physician Assistant

## 2020-08-28 ENCOUNTER — Ambulatory Visit (HOSPITAL_COMMUNITY)
Admission: RE | Admit: 2020-08-28 | Discharge: 2020-08-28 | Disposition: A | Discharge status: Home / Self Care | Payer: 59 | Attending: Urology | Admitting: Urology

## 2020-08-28 ENCOUNTER — Encounter (HOSPITAL_COMMUNITY): Payer: Self-pay | Admitting: Urology

## 2020-08-28 ENCOUNTER — Encounter (HOSPITAL_COMMUNITY)
Admission: RE | Disposition: A | Discharge status: Home / Self Care | Payer: Self-pay | Source: Home / Self Care | Attending: Urology

## 2020-08-28 DIAGNOSIS — I4891 Unspecified atrial fibrillation: Secondary | ICD-10-CM | POA: Diagnosis not present

## 2020-08-28 DIAGNOSIS — Z79899 Other long term (current) drug therapy: Secondary | ICD-10-CM | POA: Insufficient documentation

## 2020-08-28 DIAGNOSIS — N200 Calculus of kidney: Secondary | ICD-10-CM | POA: Diagnosis not present

## 2020-08-28 DIAGNOSIS — Z941 Heart transplant status: Secondary | ICD-10-CM | POA: Insufficient documentation

## 2020-08-28 DIAGNOSIS — Z880 Allergy status to penicillin: Secondary | ICD-10-CM | POA: Diagnosis not present

## 2020-08-28 DIAGNOSIS — E039 Hypothyroidism, unspecified: Secondary | ICD-10-CM | POA: Insufficient documentation

## 2020-08-28 DIAGNOSIS — Z9889 Other specified postprocedural states: Secondary | ICD-10-CM | POA: Insufficient documentation

## 2020-08-28 DIAGNOSIS — K219 Gastro-esophageal reflux disease without esophagitis: Secondary | ICD-10-CM | POA: Diagnosis not present

## 2020-08-28 DIAGNOSIS — Z841 Family history of disorders of kidney and ureter: Secondary | ICD-10-CM | POA: Diagnosis not present

## 2020-08-28 DIAGNOSIS — N401 Enlarged prostate with lower urinary tract symptoms: Secondary | ICD-10-CM | POA: Insufficient documentation

## 2020-08-28 DIAGNOSIS — D090 Carcinoma in situ of bladder: Secondary | ICD-10-CM | POA: Insufficient documentation

## 2020-08-28 DIAGNOSIS — Z882 Allergy status to sulfonamides status: Secondary | ICD-10-CM | POA: Diagnosis not present

## 2020-08-28 DIAGNOSIS — R310 Gross hematuria: Secondary | ICD-10-CM | POA: Insufficient documentation

## 2020-08-28 DIAGNOSIS — Z9221 Personal history of antineoplastic chemotherapy: Secondary | ICD-10-CM | POA: Diagnosis not present

## 2020-08-28 DIAGNOSIS — Z8571 Personal history of Hodgkin lymphoma: Secondary | ICD-10-CM | POA: Insufficient documentation

## 2020-08-28 DIAGNOSIS — Z7952 Long term (current) use of systemic steroids: Secondary | ICD-10-CM | POA: Insufficient documentation

## 2020-08-28 DIAGNOSIS — Z8249 Family history of ischemic heart disease and other diseases of the circulatory system: Secondary | ICD-10-CM | POA: Diagnosis not present

## 2020-08-28 DIAGNOSIS — N189 Chronic kidney disease, unspecified: Secondary | ICD-10-CM | POA: Insufficient documentation

## 2020-08-28 DIAGNOSIS — Z9049 Acquired absence of other specified parts of digestive tract: Secondary | ICD-10-CM | POA: Diagnosis not present

## 2020-08-28 DIAGNOSIS — Z9081 Acquired absence of spleen: Secondary | ICD-10-CM | POA: Insufficient documentation

## 2020-08-28 DIAGNOSIS — Z85038 Personal history of other malignant neoplasm of large intestine: Secondary | ICD-10-CM | POA: Diagnosis not present

## 2020-08-28 DIAGNOSIS — I700 Atherosclerosis of aorta: Secondary | ICD-10-CM | POA: Insufficient documentation

## 2020-08-28 DIAGNOSIS — Z801 Family history of malignant neoplasm of trachea, bronchus and lung: Secondary | ICD-10-CM | POA: Diagnosis not present

## 2020-08-28 DIAGNOSIS — F329 Major depressive disorder, single episode, unspecified: Secondary | ICD-10-CM | POA: Insufficient documentation

## 2020-08-28 DIAGNOSIS — C679 Malignant neoplasm of bladder, unspecified: Secondary | ICD-10-CM | POA: Diagnosis present

## 2020-08-28 HISTORY — PX: CYSTOSCOPY: SHX5120

## 2020-08-28 HISTORY — PX: TRANSURETHRAL RESECTION OF BLADDER TUMOR: SHX2575

## 2020-08-28 LAB — BASIC METABOLIC PANEL
Anion gap: 12 (ref 5–15)
BUN: 20 mg/dL (ref 6–20)
CO2: 25 mmol/L (ref 22–32)
Calcium: 9.3 mg/dL (ref 8.9–10.3)
Chloride: 103 mmol/L (ref 98–111)
Creatinine, Ser: 1.45 mg/dL — ABNORMAL HIGH (ref 0.61–1.24)
GFR calc Af Amer: >60 mL/min (ref >60)
GFR calc non Af Amer: 53 mL/min — ABNORMAL LOW (ref >60)
Glucose, Bld: 97 mg/dL (ref 70–99)
Potassium: 3.6 mmol/L (ref 3.5–5.1)
Sodium: 140 mmol/L (ref 135–145)

## 2020-08-28 LAB — CBC
HCT: 45.2 % (ref 39.0–52.0)
Hemoglobin: 14.7 g/dL (ref 13.0–17.0)
MCH: 31 pg (ref 26.0–34.0)
MCHC: 32.5 g/dL (ref 30.0–36.0)
MCV: 95.4 fL (ref 80.0–100.0)
Platelets: 383 10*3/uL (ref 150–400)
RBC: 4.74 MIL/uL (ref 4.22–5.81)
RDW: 15.1 % (ref 11.5–15.5)
WBC: 10.9 10*3/uL — ABNORMAL HIGH (ref 4.0–10.5)
nRBC: 0 % (ref 0.0–0.2)

## 2020-08-28 SURGERY — TURBT (TRANSURETHRAL RESECTION OF BLADDER TUMOR)
Anesthesia: General

## 2020-08-28 MED ORDER — FENTANYL CITRATE (PF) 100 MCG/2ML IJ SOLN
25.0000 ug | INTRAMUSCULAR | Status: DC | PRN
  Administered 2020-08-28 (×2): 25 ug via INTRAVENOUS

## 2020-08-28 MED ORDER — PROPOFOL 1000 MG/100ML IV EMUL
INTRAVENOUS | Status: AC
  Filled 2020-08-28: qty 100

## 2020-08-28 MED ORDER — MEPERIDINE HCL 50 MG/ML IJ SOLN: 6.2500 mg | INTRAMUSCULAR | Status: DC | PRN

## 2020-08-28 MED ORDER — LACTATED RINGERS IV SOLN: INTRAVENOUS | Status: DC

## 2020-08-28 MED ORDER — ONDANSETRON HCL 4 MG/2ML IJ SOLN
INTRAMUSCULAR | Status: AC
  Filled 2020-08-28: qty 2

## 2020-08-28 MED ORDER — ORAL CARE MOUTH RINSE: 15.0000 mL | Freq: Once | OROMUCOSAL | Status: AC

## 2020-08-28 MED ORDER — ACETAMINOPHEN 160 MG/5ML PO SOLN: 325.0000 mg | Freq: Once | ORAL | Status: DC | PRN

## 2020-08-28 MED ORDER — DEXAMETHASONE SODIUM PHOSPHATE 10 MG/ML IJ SOLN
INTRAMUSCULAR | Status: AC
  Filled 2020-08-28: qty 1

## 2020-08-28 MED ORDER — ACETAMINOPHEN 10 MG/ML IV SOLN: 1000.0000 mg | Freq: Once | INTRAVENOUS | Status: DC | PRN

## 2020-08-28 MED ORDER — CEFAZOLIN SODIUM-DEXTROSE 2-4 GM/100ML-% IV SOLN
2.0000 g | INTRAVENOUS | Status: DC
  Filled 2020-08-28: qty 100

## 2020-08-28 MED ORDER — SODIUM CHLORIDE 0.9 % IR SOLN
Status: DC | PRN
  Administered 2020-08-28: 1000 mL

## 2020-08-28 MED ORDER — CHLORHEXIDINE GLUCONATE 0.12 % MT SOLN
15.0000 mL | Freq: Once | OROMUCOSAL | Status: AC
  Administered 2020-08-28: 15 mL via OROMUCOSAL

## 2020-08-28 MED ORDER — LIDOCAINE 2% (20 MG/ML) 5 ML SYRINGE
INTRAMUSCULAR | Status: AC
  Filled 2020-08-28: qty 5

## 2020-08-28 MED ORDER — FENTANYL CITRATE (PF) 100 MCG/2ML IJ SOLN
INTRAMUSCULAR | Status: DC | PRN
Start: 2020-08-28 — End: 2020-08-28
  Administered 2020-08-28 (×2): 25 ug via INTRAVENOUS

## 2020-08-28 MED ORDER — PROPOFOL 500 MG/50ML IV EMUL
INTRAVENOUS | Status: DC | PRN
  Administered 2020-08-28: 150 ug/kg/min via INTRAVENOUS

## 2020-08-28 MED ORDER — FENTANYL CITRATE (PF) 100 MCG/2ML IJ SOLN
INTRAMUSCULAR | Status: AC
  Administered 2020-08-28: 25 ug via INTRAVENOUS
  Filled 2020-08-28: qty 2

## 2020-08-28 MED ORDER — ACETAMINOPHEN 10 MG/ML IV SOLN
INTRAVENOUS | Status: AC
  Administered 2020-08-28: 1000 mg via INTRAVENOUS
  Filled 2020-08-28: qty 100

## 2020-08-28 MED ORDER — ACETAMINOPHEN 325 MG PO TABS: 325.0000 mg | ORAL_TABLET | Freq: Once | ORAL | Status: DC | PRN

## 2020-08-28 MED ORDER — MIDAZOLAM HCL 2 MG/2ML IJ SOLN
INTRAMUSCULAR | Status: AC
  Filled 2020-08-28: qty 2

## 2020-08-28 MED ORDER — PROPOFOL 10 MG/ML IV BOLUS
INTRAVENOUS | Status: AC
  Filled 2020-08-28: qty 20

## 2020-08-28 MED ORDER — PROPOFOL 10 MG/ML IV BOLUS
INTRAVENOUS | Status: DC | PRN
  Administered 2020-08-28: 50 mg via INTRAVENOUS
  Administered 2020-08-28: 200 mg via INTRAVENOUS

## 2020-08-28 MED ORDER — STERILE WATER FOR IRRIGATION IR SOLN
Status: DC | PRN
  Administered 2020-08-28: 3000 mL via INTRAVESICAL

## 2020-08-28 MED ORDER — AMISULPRIDE (ANTIEMETIC) 5 MG/2ML IV SOLN: 10.0000 mg | Freq: Once | INTRAVENOUS | Status: DC | PRN

## 2020-08-28 MED ORDER — FENTANYL CITRATE (PF) 100 MCG/2ML IJ SOLN
INTRAMUSCULAR | Status: AC
  Filled 2020-08-28: qty 2

## 2020-08-28 SURGICAL SUPPLY — 27 items
BAG DRN RND TRDRP ANRFLXCHMBR (UROLOGICAL SUPPLIES)
BAG URINE DRAIN 2000ML AR STRL (UROLOGICAL SUPPLIES) IMPLANT
BAG URO CATCHER STRL LF (MISCELLANEOUS) ×3 IMPLANT
CATH FOLEY 3WAY  5CC 18FR (CATHETERS) ×3
CATH FOLEY 3WAY 5CC 18FR (CATHETERS) IMPLANT
CATH INTERMIT  6FR 70CM (CATHETERS) ×3 IMPLANT
CATH URET 5FR 28IN CONE TIP (BALLOONS)
CATH URET 5FR 70CM CONE TIP (BALLOONS) IMPLANT
CLOTH BEACON ORANGE TIMEOUT ST (SAFETY) ×3 IMPLANT
CNTNR URN SCR LID CUP LEK RST (MISCELLANEOUS) IMPLANT
CONT SPEC 4OZ STRL OR WHT (MISCELLANEOUS) ×3
ELECT REM PT RETURN 15FT ADLT (MISCELLANEOUS) ×3 IMPLANT
GLOVE BIOGEL M STRL SZ7.5 (GLOVE) ×3 IMPLANT
GOWN STRL REUS W/TWL LRG LVL3 (GOWN DISPOSABLE) ×6 IMPLANT
GUIDEWIRE STR DUAL SENSOR (WIRE) ×3 IMPLANT
KIT TURNOVER KIT A (KITS) IMPLANT
LOOP CUT BIPOLAR 24F LRG (ELECTROSURGICAL) ×2 IMPLANT
MANIFOLD NEPTUNE II (INSTRUMENTS) ×3 IMPLANT
PACK CYSTO (CUSTOM PROCEDURE TRAY) ×3 IMPLANT
PLUG CATH AND CAP STER (CATHETERS) ×2 IMPLANT
SYR 20ML LL LF (SYRINGE) ×3 IMPLANT
SYR 30ML LL (SYRINGE) ×2 IMPLANT
SYR TOOMEY IRRIG 70ML (MISCELLANEOUS) ×3
SYRINGE TOOMEY IRRIG 70ML (MISCELLANEOUS) IMPLANT
TUBING CONNECTING 10 (TUBING) ×2 IMPLANT
TUBING CONNECTING 10' (TUBING) ×1
TUBING UROLOGY SET (TUBING) ×3 IMPLANT

## 2020-08-28 NOTE — Anesthesia Postprocedure Evaluation (Signed)
Anesthesia Post Note  Patient: Glenn Gonzalez  Procedure(s) Performed: TRANSURETHRAL RESECTION OF BLADDER TUMOR (TURBT) (N/A ) CYSTOSCOPY (N/A )     Patient location during evaluation: PACU Anesthesia Type: General Level of consciousness: awake and alert Pain management: pain level controlled Vital Signs Assessment: post-procedure vital signs reviewed and stable Respiratory status: spontaneous breathing, nonlabored ventilation, respiratory function stable and patient connected to nasal cannula oxygen Cardiovascular status: blood pressure returned to baseline and stable Postop Assessment: no apparent nausea or vomiting Anesthetic complications: no   No complications documented.  Last Vitals:  Vitals:   08/28/20 1045 08/28/20 1055  BP: 114/72 125/90  Pulse: 67   Resp: 13 14  Temp:    SpO2: 95% 98%    Last Pain:  Vitals:   08/28/20 1045  TempSrc:   PainSc: Mazon Gee Habig

## 2020-08-28 NOTE — Anesthesia Procedure Notes (Signed)
Procedure Name: LMA Insertion Date/Time: 08/28/2020 9:23 AM Performed by: Eben Burow, CRNA Pre-anesthesia Checklist: Patient identified, Emergency Drugs available, Suction available, Patient being monitored and Timeout performed Patient Re-evaluated:Patient Re-evaluated prior to induction Oxygen Delivery Method: Circle system utilized Preoxygenation: Pre-oxygenation with 100% oxygen Induction Type: IV induction Ventilation: Mask ventilation without difficulty LMA: LMA inserted LMA Size: 4.0 Number of attempts: 1 Placement Confirmation: positive ETCO2 Tube secured with: Tape Dental Injury: Teeth and Oropharynx as per pre-operative assessment

## 2020-08-28 NOTE — Transfer of Care (Signed)
Immediate Anesthesia Transfer of Care Note  Patient: Glenn Gonzalez  Procedure(s) Performed: TRANSURETHRAL RESECTION OF BLADDER TUMOR (TURBT) (N/A ) CYSTOSCOPY (N/A )  Patient Location: PACU  Anesthesia Type:General  Level of Consciousness: awake, drowsy and patient cooperative  Airway & Oxygen Therapy: Patient Spontanous Breathing and Patient connected to face mask oxygen  Post-op Assessment: Report given to RN and Post -op Vital signs reviewed and stable  Post vital signs: Reviewed and stable  Last Vitals:  Vitals Value Taken Time  BP 114/70 08/28/20 1012  Temp    Pulse 70 08/28/20 1013  Resp 15 08/28/20 1013  SpO2 100 % 08/28/20 1013  Vitals shown include unvalidated device data.  Last Pain:  Vitals:   08/28/20 0714  TempSrc: Oral  PainSc: 2          Complications: No complications documented.

## 2020-08-28 NOTE — Discharge Instructions (Signed)
General Anesthesia, Adult, Care After This sheet gives you information about how to care for yourself after your procedure. Your health care provider may also give you more specific instructions. If you have problems or questions, contact your health care provider. What can I expect after the procedure? After the procedure, the following side effects are common:  Pain or discomfort at the IV site.  Nausea.  Vomiting.  Sore throat.  Trouble concentrating.  Feeling cold or chills.  Weak or tired.  Sleepiness and fatigue.  Soreness and body aches. These side effects can affect parts of the body that were not involved in surgery. Follow these instructions at home:  For at least 24 hours after the procedure:  Have a responsible adult stay with you. It is important to have someone help care for you until you are awake and alert.  Rest as needed.  Do not: ? Participate in activities in which you could fall or become injured. ? Drive. ? Use heavy machinery. ? Drink alcohol. ? Take sleeping pills or medicines that cause drowsiness. ? Make important decisions or sign legal documents. ? Take care of children on your own. Eating and drinking  Follow any instructions from your health care provider about eating or drinking restrictions.  When you feel hungry, start by eating small amounts of foods that are soft and easy to digest (bland), such as toast. Gradually return to your regular diet.  Drink enough fluid to keep your urine pale yellow.  If you vomit, rehydrate by drinking water, juice, or clear broth. General instructions  If you have sleep apnea, surgery and certain medicines can increase your risk for breathing problems. Follow instructions from your health care provider about wearing your sleep device: ? Anytime you are sleeping, including during daytime naps. ? While taking prescription pain medicines, sleeping medicines, or medicines that make you drowsy.  Return to  your normal activities as told by your health care provider. Ask your health care provider what activities are safe for you.  Take over-the-counter and prescription medicines only as told by your health care provider.  If you smoke, do not smoke without supervision.  Keep all follow-up visits as told by your health care provider. This is important. Contact a health care provider if:  You have nausea or vomiting that does not get better with medicine.  You cannot eat or drink without vomiting.  You have pain that does not get better with medicine.  You are unable to pass urine.  You develop a skin rash.  You have a fever.  You have redness around your IV site that gets worse. Get help right away if:  You have difficulty breathing.  You have chest pain.  You have blood in your urine or stool, or you vomit blood. Summary  After the procedure, it is common to have a sore throat or nausea. It is also common to feel tired.  Have a responsible adult stay with you for the first 24 hours after general anesthesia. It is important to have someone help care for you until you are awake and alert.  When you feel hungry, start by eating small amounts of foods that are soft and easy to digest (bland), such as toast. Gradually return to your regular diet.  Drink enough fluid to keep your urine pale yellow.  Return to your normal activities as told by your health care provider. Ask your health care provider what activities are safe for you. This information is not   intended to replace advice given to you by your health care provider. Make sure you discuss any questions you have with your health care provider. Document Revised: 11/20/2017 Document Reviewed: 07/03/2017 Elsevier Patient Education  [place], Adult An indwelling urinary catheter is a thin tube that is put into your bladder. The tube helps to drain pee (urine) out of your body.  The tube goes in through your urethra. Your urethra is where pee comes out of your body. Your pee will come out through the catheter, then it will go into a bag (drainage bag). Take good care of your catheter so it will work well. How to wear your catheter and bag Supplies needed  Sticky tape (adhesive tape) or a leg strap.  Alcohol wipe or soap and water (if you use tape).  A clean towel (if you use tape).  Large overnight bag.  Smaller bag (leg bag). Wearing your catheter Attach your catheter to your leg with tape or a leg strap.  Make sure the catheter is not pulled tight.  If a leg strap gets wet, take it off and put on a dry strap.  If you use tape to hold the bag on your leg: 1. Use an alcohol wipe or soap and water to wash your skin where the tape made it sticky before. 2. Use a clean towel to pat-dry that skin. 3. Use new tape to make the bag stay on your leg. Wearing your bags You should have been given a large overnight bag.  You may wear the overnight bag in the day or night.  Always have the overnight bag lower than your bladder.  Do not let the bag touch the floor.  Before you go to sleep, put a clean plastic bag in a wastebasket. Then hang the overnight bag inside the wastebasket. You should also have a smaller leg bag that fits under your clothes.  Always wear the leg bag below your knee.  Do not wear your leg bag at night. How to care for your skin and catheter Supplies needed  A clean washcloth.  Water and mild soap.  A clean towel. Caring for your skin and catheter      Clean the skin around your catheter every day: 1. Wash your hands with soap and water. 2. Wet a clean washcloth in warm water and mild soap. 3. Clean the skin around your urethra.  If you are male:  Gently spread the folds of skin around your vagina (labia).  With the washcloth in your other hand, wipe the inner side of your labia on each side. Wipe from front to  back.  If you are male:  Pull back any skin that covers the end of your penis (foreskin).  With the washcloth in your other hand, wipe your penis in small circles. Start wiping at the tip of your penis, then move away from the catheter.  Move the foreskin back in place, if needed. 4. With your free hand, hold the catheter close to where it goes into your body.  Keep holding the catheter during cleaning so it does not get pulled out. 5. With the washcloth in your other hand, clean the catheter.  Only wipe downward on the catheter.  Do not wipe upward toward your body. Doing this may push germs into your urethra and cause infection. 6. Use a clean towel to pat-dry the catheter and the skin around it. Make sure to  wipe off all soap. 7. Wash your hands with soap and water.  Shower every day. Do not take baths.  Do not use cream, ointment, or lotion on the area where the catheter goes into your body, unless your doctor tells you to.  Do not use powders, sprays, or lotions on your genital area.  Check your skin around the catheter every day for signs of infection. Check for: ? Redness, swelling, or pain. ? Fluid or blood. ? Warmth. ? Pus or a bad smell. How to empty the bag Supplies needed  Rubbing alcohol.  Gauze pad or cotton ball.  Tape or a leg strap. Emptying the bag Pour the pee out of your bag when it is ?- full, or at least 2-3 times a day. Do this for your overnight bag and your leg bag. 1. Wash your hands with soap and water. 2. Separate (detach) the bag from your leg. 3. Hold the bag over the toilet or a clean pail. Keep the bag lower than your hips and bladder. This is so the pee (urine) does not go back into the tube. 4. Open the pour spout. It is at the bottom of the bag. 5. Empty the pee into the toilet or pail. Do not let the pour spout touch any surface. 6. Put rubbing alcohol on a gauze pad or cotton ball. 7. Use the gauze pad or cotton ball to clean the  pour spout. 8. Close the pour spout. 9. Attach the bag to your leg with tape or a leg strap. 10. Wash your hands with soap and water. Follow instructions for cleaning the drainage bag:  From the product maker.  As told by your doctor. How to change the bag Supplies needed  Alcohol wipes.  A clean bag.  Tape or a leg strap. Changing the bag Replace your bag when it starts to leak, smell bad, or look dirty. 1. Wash your hands with soap and water. 2. Separate the dirty bag from your leg. 3. Pinch the catheter with your fingers so that pee does not spill out. 4. Separate the catheter tube from the bag tube where these tubes connect (at the connection valve). Do not let the tubes touch any surface. 5. Clean the end of the catheter tube with an alcohol wipe. Use a different alcohol wipe to clean the end of the bag tube. 6. Connect the catheter tube to the tube of the clean bag. 7. Attach the clean bag to your leg with tape or a leg strap. Do not make the bag tight on your leg. 8. Wash your hands with soap and water. General rules   Never pull on your catheter. Never try to take it out. Doing that can hurt you.  Always wash your hands before and after you touch your catheter or bag. Use a mild, fragrance-free soap. If you do not have soap and water, use hand sanitizer.  Always make sure there are no twists or bends (kinks) in the catheter tube.  Always make sure there are no leaks in the catheter or bag.  Drink enough fluid to keep your pee pale yellow.  Do not take baths, swim, or use a hot tub.  If you are male, wipe from front to back after you poop (have a bowel movement). Contact a doctor if:  Your pee is cloudy.  Your pee smells worse than usual.  Your catheter gets clogged.  Your catheter leaks.  Your bladder feels full. Get help right away  if:  You have redness, swelling, or pain where the catheter goes into your body.  You have fluid, blood, pus, or a bad  smell coming from the area where the catheter goes into your body.  Your skin feels warm where the catheter goes into your body.  You have a fever.  You have pain in your: ? Belly (abdomen). ? Legs. ? Lower back. ? Bladder.  You see blood in the catheter.  Your pee is pink or red.  You feel sick to your stomach (nauseous).  You throw up (vomit).  You have chills.  Your pee is not draining into the bag.  Your catheter gets pulled out. Summary  An indwelling urinary catheter is a thin tube that is placed into the bladder to help drain pee (urine) out of the body.  The catheter is placed into the part of the body that drains pee from the bladder (urethra).  Taking good care of your catheter will keep it working properly and help prevent problems.  Always wash your hands before and after touching your catheter or bag.  Never pull on your catheter or try to take it out. This information is not intended to replace advice given to you by your health care provider. Make sure you discuss any questions you have with your health care provider. Document Revised: 03/11/2019 Document Reviewed: 07/03/2017 Elsevier Patient Education  [place].

## 2020-08-28 NOTE — Interval H&P Note (Signed)
History and Physical Interval Note:  08/28/2020 8:00 AM  Glenn Gonzalez  has presented today for surgery, with the diagnosis of BLADDER CANCER.  The various methods of treatment have been discussed with the patient and family. After consideration of risks, benefits and other options for treatment, the patient has consented to  Procedure(s) with comments: TRANSURETHRAL RESECTION OF BLADDER TUMOR (TURBT) (N/A) - 30 MINS CYSTOSCOPY (N/A) as a surgical intervention.  The patient's history has been reviewed, patient examined, no change in status, stable for surgery.  I have reviewed the patient's chart and labs.  Questions were answered to the patient's satisfaction.     Remi Haggard

## 2020-08-28 NOTE — Op Note (Signed)
Operative report  Preop diagnosis: Bladder cancer Postop diagnosis: Bladder cancer Procedure: Cystoscopy, transurethral resection of bladder tumor (medium) Surgeon: Milford Cage Anesthesia: General Estimated blood loss: Minimal Operative findings: Abnormal appearing tissue in the bed of the prior TURBT site at bladder neck extending onto the trigonal area but not involving either ureteral orifice.  Area was loop resected into the muscle and specimen retrieved.  Total area was 2-1/2-3 cm in size.  Operative note: After obtaining informed consent for the patient is taken the major cystoscopy suite placed under general anesthesia.  Placed in the dorsolithotomy position genitalia prepped and draped in usual sterile fashion.  Proper pause and timeout was performed.  75 Fransico was then advanced into the bladder without difficulty.  Previously resected area was visualized.  There are some abnormal appearing tissue which extended from the bladder neck onto the trigonal area but not involving either ureteral orifice but more towards the left ureteral orifice.  A guidewire was subsequently utilized to pass inside the left ureteral orifice and open tip catheter was placed inside the ureteral orifice for identification.  The cystoscope was then removed and the resectoscope was then placed under direct vision into the bladder alongside the open tip catheter.  Utilizing the bipolar saline resectoscope the abnormal tissue was completely resected including the bladder neck area.  Resection was carried into the muscular tissue and carried up just short of the left ureteral orifice which was easily visualized due to the open tip catheter.  The ureteral orifice again was not involved with any tumor.  After resecting the abnormal tissue base was cauterized with cautery loop.  The open tip catheter was removed with good flow of urine through the ureteral orifice noted.  The tissue sample was collected and sent to pathology for  evaluation.  Reinspection again revealed good hemostasis.  34 French Foley was placed due to the depth of resection and location being at the junction of the prostate and bladder neck.  Effluent was crystal clear at termination of the case.  Procedure was terminated he was awakened from anesthesia and taken back to the recovery room in stable condition.  No immediate complication from the procedure.

## 2020-08-28 NOTE — Anesthesia Preprocedure Evaluation (Addendum)
Anesthesia Evaluation  Patient identified by MRN, date of birth, ID band Patient awake    Reviewed: Allergy & Precautions, NPO status , Patient's Chart, lab work & pertinent test results  Airway Mallampati: I  TM Distance: >3 FB Neck ROM: Full    Dental  (+) Teeth Intact, Dental Advisory Given   Pulmonary neg pulmonary ROS,    breath sounds clear to auscultation       Cardiovascular + dysrhythmias Atrial Fibrillation  Rhythm:Regular Rate:Normal  - s/p Heart Transplant   Neuro/Psych PSYCHIATRIC DISORDERS Depression negative neurological ROS     GI/Hepatic GERD  ,  Endo/Other  Hypothyroidism   Renal/GU CRF and Renal InsufficiencyRenal disease     Musculoskeletal negative musculoskeletal ROS (+)   Abdominal Normal abdominal exam  (+)   Peds  Hematology   Anesthesia Other Findings   Reproductive/Obstetrics                            Anesthesia Physical Anesthesia Plan  ASA: III  Anesthesia Plan: General   Post-op Pain Management:    Induction: Intravenous  PONV Risk Score and Plan: 2 and Ondansetron and TIVA  Airway Management Planned: LMA  Additional Equipment: None  Intra-op Plan:   Post-operative Plan: Extubation in OR  Informed Consent: I have reviewed the patients History and Physical, chart, labs and discussed the procedure including the risks, benefits and alternatives for the proposed anesthesia with the patient or authorized representative who has indicated his/her understanding and acceptance.     Dental advisory given  Plan Discussed with: CRNA  Anesthesia Plan Comments: (  Lab Results      Component                Value               Date                      WBC                      10.9 (H)            08/28/2020                HGB                      14.7                08/28/2020                HCT                      45.2                08/28/2020                 MCV                      95.4                08/28/2020                PLT                      383                 08/28/2020  Lab Results      Component                Value               Date                      CREATININE               1.45 (H)            08/28/2020                BUN                      20                  08/28/2020                NA                       140                 08/28/2020                K                        3.6                 08/28/2020                CL                       103                 08/28/2020                CO2                      25                  08/28/2020            Lab Results      Component                Value               Date                      INR                      1.19                02/22/2018                INR                      2.0                 05/22/2016                INR                      1.4                 05/15/2016           )  Anesthesia Quick Evaluation  

## 2020-08-29 ENCOUNTER — Encounter (HOSPITAL_COMMUNITY): Payer: Self-pay | Admitting: Urology

## 2020-08-30 ENCOUNTER — Other Ambulatory Visit: Payer: Self-pay | Admitting: *Deleted

## 2020-08-30 DIAGNOSIS — D500 Iron deficiency anemia secondary to blood loss (chronic): Secondary | ICD-10-CM

## 2020-08-30 LAB — SURGICAL PATHOLOGY

## 2020-08-31 ENCOUNTER — Inpatient Hospital Stay (HOSPITAL_BASED_OUTPATIENT_CLINIC_OR_DEPARTMENT_OTHER): Payer: 59 | Admitting: Hematology

## 2020-08-31 ENCOUNTER — Other Ambulatory Visit: Payer: Self-pay

## 2020-08-31 ENCOUNTER — Inpatient Hospital Stay: Payer: 59 | Attending: Hematology

## 2020-08-31 VITALS — BP 138/82 | HR 97 | Temp 97.8°F | Resp 18 | Ht 74.0 in | Wt 223.1 lb

## 2020-08-31 DIAGNOSIS — C61 Malignant neoplasm of prostate: Secondary | ICD-10-CM | POA: Diagnosis not present

## 2020-08-31 DIAGNOSIS — D509 Iron deficiency anemia, unspecified: Secondary | ICD-10-CM | POA: Insufficient documentation

## 2020-08-31 DIAGNOSIS — D72829 Elevated white blood cell count, unspecified: Secondary | ICD-10-CM | POA: Insufficient documentation

## 2020-08-31 DIAGNOSIS — D500 Iron deficiency anemia secondary to blood loss (chronic): Secondary | ICD-10-CM

## 2020-08-31 DIAGNOSIS — D75839 Thrombocytosis, unspecified: Secondary | ICD-10-CM | POA: Diagnosis not present

## 2020-08-31 LAB — CBC WITH DIFFERENTIAL (CANCER CENTER ONLY)
Abs Immature Granulocytes: 0.06 10*3/uL (ref 0.00–0.07)
Basophils Absolute: 0.1 10*3/uL (ref 0.0–0.1)
Basophils Relative: 1 %
Eosinophils Absolute: 0.3 10*3/uL (ref 0.0–0.5)
Eosinophils Relative: 3 %
HCT: 44.5 % (ref 39.0–52.0)
Hemoglobin: 14.3 g/dL (ref 13.0–17.0)
Immature Granulocytes: 1 %
Lymphocytes Relative: 11 %
Lymphs Abs: 1.1 10*3/uL (ref 0.7–4.0)
MCH: 30.7 pg (ref 26.0–34.0)
MCHC: 32.1 g/dL (ref 30.0–36.0)
MCV: 95.5 fL (ref 80.0–100.0)
Monocytes Absolute: 1.5 10*3/uL — ABNORMAL HIGH (ref 0.1–1.0)
Monocytes Relative: 15 %
Neutro Abs: 7.2 10*3/uL (ref 1.7–7.7)
Neutrophils Relative %: 69 %
Platelet Count: 304 10*3/uL (ref 150–400)
RBC: 4.66 MIL/uL (ref 4.22–5.81)
RDW: 15.4 % (ref 11.5–15.5)
WBC Count: 10.2 10*3/uL (ref 4.0–10.5)
nRBC: 0.2 % (ref 0.0–0.2)

## 2020-08-31 LAB — CMP (CANCER CENTER ONLY)
ALT: 10 U/L (ref 0–44)
AST: 16 U/L (ref 15–41)
Albumin: 3.4 g/dL — ABNORMAL LOW (ref 3.5–5.0)
Alkaline Phosphatase: 80 U/L (ref 38–126)
Anion gap: 8 (ref 5–15)
BUN: 15 mg/dL (ref 6–20)
CO2: 26 mmol/L (ref 22–32)
Calcium: 9.1 mg/dL (ref 8.9–10.3)
Chloride: 105 mmol/L (ref 98–111)
Creatinine: 1.36 mg/dL — ABNORMAL HIGH (ref 0.61–1.24)
GFR, Est AFR Am: >60 mL/min
GFR, Estimated: 57 mL/min — ABNORMAL LOW
Glucose, Bld: 93 mg/dL (ref 70–99)
Potassium: 3.8 mmol/L (ref 3.5–5.1)
Sodium: 139 mmol/L (ref 135–145)
Total Bilirubin: 0.7 mg/dL (ref 0.3–1.2)
Total Protein: 6.8 g/dL (ref 6.5–8.1)

## 2020-08-31 LAB — FERRITIN: Ferritin: 272 ng/mL (ref 24–336)

## 2020-08-31 LAB — IRON AND TIBC
Iron: 67 ug/dL (ref 42–163)
Saturation Ratios: 28 % (ref 20–55)
TIBC: 240 ug/dL (ref 202–409)
UIBC: 173 ug/dL (ref 117–376)

## 2020-08-31 NOTE — Progress Notes (Signed)
HEMATOLOGY/ONCOLOGY CLINIC NOTE  Date of Service: 08/31/2020  Patient Care Team: Lajean Manes, MD as PCP - General (Internal Medicine)  CHIEF COMPLAINTS/PURPOSE OF CONSULTATION:  Thrombocytosis Iron deficiency Leucocytosis   HISTORY OF PRESENTING ILLNESS:   Glenn Gonzalez is a wonderful 59 y.o. male who has been referred to [place] by Dr. Felipa Eth for evaluation and management of thrombocytosis. The pt reports that he is doing well overall.  The pt reports a hx of Hodgkin's lymphoma in the 1980s. He was treated at [facility] with maximum cycles of MOP and ABVD, as well as spade radiation. Denies bone marrow involvement.    He also has a hx of melanoma. Most of the spots were on his chest around the site of his radiation. He has had over 20 spots removed, the most recent on his scalp. He manages this with [facility].  He was diagnosed with colon cancer last year and has had several polyps removed. He has been getting sigmoidoscopies every 3 months and colonoscopies yearly.  He notes a recent prostate infection, which is being followed by a urologist. His most recent PSA was 2.66.  He is currently taking 2.5 mg prednisone and ASA. He is also taking Doxycycline 100 mg BID after the removal of the spot on his scalp.  Most recent lab results (06/14/2019) of CBC is as follows: all values are WNL except for WBC at 11.3k, HGB at 11.8, HCT at 38.7, MCH at 25.2, MCHC at 30.5, RDW at 18.2, PLT at 513  On review of systems, pt reports bladder pain, body soreness and denies mouth sores, belly pain, mouth soreness, and any other symptoms.   On PMHx the pt reports hx of Hodgkins lymphoma, heart transplant, melanoma, colon cancer, multiple catheter-related blood clots after surgery On FHx the pt reports that his brother had colon cancer with polyps  Interval History:  Glenn Gonzalez is a 59 y.o. male is here today for the management and evaluation of iron deficiency. The  patient's last visit with [place] was on 02/02/2020. The pt reports that he is doing well overall, but notes some fatigue.  The pt reports no fevers/chills/night sweats or weight loss  Lab results today (08/31/20) of CBC w/diff and CMP is as follows:  08/31/2020 Ferritin at 272 08/31/2020 Iron Panel ishows iron saturation of 28%   Denies any blood loss, GI bleeding.  MEDICAL HISTORY:  Past Medical History:  Diagnosis Date  . A-fib ([place])    IN PAST with old heart   . Bladder tumor   . Bulging lumbar disc    states " i threw my back out on tuesday 6-16, i ahve a bulging disc, but i can lie on my back a side "   . Chronic fatigue   . Chronic kidney disease    CREATININE NOW 1.5 stage 1 ckd  . Colon cancer ([place]) DX 2019   SURGERY DONE  . Complication of anesthesia    AFTER HEART TRANSPLANT TOOK 2 YEARS TO GET BACK TO SELF; patient does not want aneshesia for endoscopy procedures   . Complication of anesthesia    likes propofol wants as few of anesthesia drugs as possible  . Depression    SITUATIONAL  . GERD (gastroesophageal reflux disease)   . Gout yrs ago  . History of chemotherapy 1982 for hodgkins   DAMAGED HEART MUSCLE WITH RADIATION ALSO  . History of kidney stones   . History of radiation therapy 1982   for  hodgkins lymphoma  . Hodgkin's lymphoma ([place]) 1980   IIIB. 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  . Hypothyroidism   . Pneumonia FROM CHF 2016   recurrent pneumonia sedf.  rad tx for lymphoma  . Skin abnormality    right chest small area with squamous cell area removed 3 weeks ago as of 07-27-2020  . Skin cancer    AREAS REMOVED FROM [facility], CHECK AND BACK AND HEAD SEPT 2019  . Status post heart transplantation ([place]) 2017   caused by chemo drug adriomycin    SURGICAL HISTORY: Past Surgical History:  Procedure Laterality Date  . BIOPSY  07/02/2018   Procedure: BIOPSY;  Surgeon: Carol Ada, MD;  [facility];  Service: Endoscopy;;  . BIOPSY   05/20/2019   Procedure: BIOPSY;  Surgeon: Carol Ada, MD;  [facility];  Service: Endoscopy;;  . CARDIAC CATHETERIZATION N/A 04/20/2015   Procedure: Right/Left Heart Cath and Coronary Angiography;  Surgeon: Jolaine Artist, MD;  Location: [place] CV LAB;  Service: Cardiovascular;  Laterality: N/A;  . CARDIAC CATHETERIZATION  10-11-15   pt states routine heart cath to be done at [facility]   . CARDIAC CATHETERIZATION N/A 04/02/2016   Procedure: Right Heart Cath;  Surgeon: Jolaine Artist, MD;  Location: [place] CV LAB;  Service: Cardiovascular;  Laterality: N/A;  . CARDIAC CATHETERIZATION  JULY 2019 AT [facility]  . COLONOSCOPY N/A 07/02/2018   Procedure: COLONOSCOPY;  Surgeon: Carol Ada, MD;  [facility];  Service: Endoscopy;  Laterality: N/A;  . COLONOSCOPY N/A 08/05/2019   Procedure: COLONOSCOPY;  Surgeon: Carol Ada, MD;  [facility];  Service: Endoscopy;  Laterality: N/A;  . COLONOSCOPY WITH PROPOFOL N/A 08/03/2015   Procedure: COLONOSCOPY WITH PROPOFOL;  Surgeon: Carol Ada, MD;  [facility];  Service: Endoscopy;  Laterality: N/A;  wants to try to do procedure without sedation  . COLONOSCOPY WITH PROPOFOL N/A 08/10/2020   Procedure: COLONOSCOPY WITH PROPOFOL;  Surgeon: Carol Ada, MD;  [facility];  Service: Endoscopy;  Laterality: N/A;  . CYSTOSCOPY N/A 08/28/2020   Procedure: CYSTOSCOPY;  Surgeon: Remi Haggard, MD;  [facility];  Service: Urology;  Laterality: N/A;  . CYSTOSCOPY W/ RETROGRADES Bilateral 07/31/2020   Procedure: CYSTOSCOPY WITH RETROGRADE PYELOGRAM;  Surgeon: Remi Haggard, MD;  Location[facility];  Service: Urology;  Laterality: Bilateral;  . CYSTOSCOPY WITH INSERTION OF UROLIFT N/A 10/18/2018   Procedure: CYSTOSCOPY WITH INSERTION OF UROLIFT;  Surgeon: Cleon Gustin, MD;  Location: [facility];  Service: Urology;  Laterality: N/A;  30 MINS  . CYSTOSCOPY WITH  RETROGRADE PYELOGRAM, URETEROSCOPY AND STENT PLACEMENT Right 10/12/2015   Procedure: CYSTOSCOPY WITH RETROGRADE PYELOGRAM, URETEROSCOPY AND STENT PLACEMENT;  Surgeon: Cleon Gustin, MD;  [facility];  Service: Urology;  Laterality: Right;  . ESOPHAGOGASTRODUODENOSCOPY    . ESOPHAGOGASTRODUODENOSCOPY (EGD) WITH PROPOFOL N/A 09/29/2019   Procedure: ESOPHAGOGASTRODUODENOSCOPY (EGD) WITH PROPOFOL;  Surgeon: Carol Ada, MD;  [facility];  Service: Endoscopy;  Laterality: N/A;  . ESOPHAGOGASTRODUODENOSCOPY (EGD) WITH PROPOFOL N/A 08/10/2020   Procedure: ESOPHAGOGASTRODUODENOSCOPY (EGD) WITH PROPOFOL;  Surgeon: Carol Ada, MD;  [facility];  Service: Endoscopy;  Laterality: N/A;  . EXPLORATORY LAPAROTOMY  1982   staging laparotomy  . FLEXIBLE SIGMOIDOSCOPY N/A 05/20/2019   Procedure: FLEXIBLE SIGMOIDOSCOPY;  Surgeon: Carol Ada, MD;  [facility];  Service: Endoscopy;  Laterality: N/A;  . FLEXIBLE SIGMOIDOSCOPY N/A 04/20/2020   Procedure: FLEXIBLE  SIGMOIDOSCOPY;  Surgeon: Carol Ada, MD;  Location[facility];  Service: Endoscopy;  Laterality: N/A;  . gynemastia Bilateral   . HEART TRANSPLANT  06/20/2016   at [facility]  . HOT HEMOSTASIS N/A 07/02/2018   Procedure: HOT HEMOSTASIS (ARGON PLASMA COAGULATION/BICAP);  Surgeon: Carol Ada, MD;  Location: [facility];  Service: Endoscopy;  Laterality: N/A;  . LUMBAR LAMINECTOMY/DECOMPRESSION MICRODISCECTOMY Right 01/04/2016   Procedure: Microdiscectomy Lumbar four Lumbar five right;  Surgeon: Eustace Moore, MD;  Location: [place] [facility];  Service: Neurosurgery;  Laterality: Right;  . neck biopses     x5  . 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 POLYPECTOMY  07/02/2018   Procedure: POLYPECTOMY;  Surgeon: Carol Ada, MD;  [facility];  Service: Endoscopy;;  . POLYPECTOMY  08/05/2019   Procedure: POLYPECTOMY;  Surgeon: Carol Ada, MD;  [facility];  Service: Endoscopy;;  . POLYPECTOMY  04/20/2020   Procedure: POLYPECTOMY;  Surgeon: Carol Ada, MD;  [facility];  Service: Endoscopy;;  . POLYPECTOMY  08/10/2020   Procedure: POLYPECTOMY;  Surgeon: Carol Ada, MD;  [facility];  Service: Endoscopy;;  . PORTACATH PLACEMENT     06-29-15 inserted, now removed.  . SPLENECTOMY, TOTAL  1981  . STONE EXTRACTION WITH BASKET Right 10/12/2015   Procedure: STONE EXTRACTION WITH BASKET;  Surgeon: Cleon Gustin, MD;  [facility];  Service: Urology;  Laterality: Right;  . SUBMUCOSAL INJECTION  07/02/2018   Procedure: SUBMUCOSAL INJECTION;  Surgeon: Carol Ada, MD;  [facility];  Service: Endoscopy;;  . TONSILLECTOMY  as child  . TRANSURETHRAL RESECTION OF BLADDER TUMOR N/A 07/31/2020   Procedure: TRANSURETHRAL RESECTION OF BLADDER TUMOR (TURBT) FULGERATION;  Surgeon: Remi Haggard, MD;  Location: [facility];  Service: Urology;  Laterality: N/A;  . TRANSURETHRAL RESECTION OF BLADDER TUMOR N/A 08/28/2020   Procedure: TRANSURETHRAL RESECTION OF BLADDER TUMOR (TURBT);  Surgeon: Remi Haggard, MD;  [facility];  Service: Urology;  Laterality: N/A;  30 MINS  . VARICELE REPAIR  40 YRS AGO    SOCIAL HISTORY: Social History   Socioeconomic History  . Marital status: Married    Spouse name: Not on file  . Number of children: Not on file  . Years of education: Not on file  . Highest education level: Not on file  Occupational History  . Occupation: disabled  Tobacco Use  . Smoking status: Never Smoker  . Smokeless tobacco: Never Used  Vaping Use  . Vaping Use: Never used  Substance and Sexual Activity  . Alcohol use: No  . Drug use: No  . Sexual activity: Not on file  Other Topics Concern  . Not on file  Social History Narrative  . Not on file   Social Determinants of Health   Financial Resource Strain:   . Difficulty of Paying Living Expenses: Not on file    Food Insecurity:   . Worried About Charity fundraiser in the Last Year: Not on file  . Ran Out of Food in the Last Year: Not on file  Transportation Needs:   . Lack of Transportation (Medical): Not on file  . Lack of Transportation (Non-Medical): Not on file  Physical Activity:   . Days of Exercise per Week: Not on file  . Minutes of Exercise per Session: Not on file  Stress:   . Feeling of Stress : Not on file  Social Connections:   .  Frequency of Communication with Friends and Family: Not on file  . Frequency of Social Gatherings with Friends and Family: Not on file  . Attends Religious Services: Not on file  . Active Member of Clubs or Organizations: Not on file  . Attends Archivist Meetings: Not on file  . Marital Status: Not on file  Intimate Partner Violence:   . Fear of Current or Ex-Partner: Not on file  . Emotionally Abused: Not on file  . Physically Abused: Not on file  . Sexually Abused: Not on file    FAMILY HISTORY: Family History  Problem Relation Age of Onset  . Hyperthyroidism Mother   . Insomnia Mother   . Hypertension Father   . Depression Father   . Insomnia Father     ALLERGIES:  is allergic to digoxin, phencyclidine, spironolactone, lactose intolerance (gi), penicillins, and sulfur.  MEDICATIONS:  Current Outpatient Medications  Medication Sig Dispense Refill  . acetaminophen (TYLENOL) 650 MG CR tablet Take 1,300 mg by mouth at bedtime.     Marland Kitchen allopurinol (ZYLOPRIM) 100 MG tablet Take 200 mg by mouth daily.   10  . Azelastine-Fluticasone-NaCl 137-50 & 0.9 MCG/ACT-% KIT Place 1 spray into the nose at bedtime.     . Calcium Carbonate-Vitamin D3 (RA CALCIUM 600/VITAMIN D-3) 600-400 MG-UNIT TABS Take 1 tablet by mouth 2 (two) times a day.     . cetirizine (ZYRTEC) 10 MG tablet Take 10 mg by mouth at bedtime.     Marland Kitchen ENVARSUS XR 0.75 MG TB24 Take 2 tablets by mouth daily.    Marland Kitchen ENVARSUS XR 1 MG TB24 Take 2 mg by mouth daily.     Marland Kitchen levothyroxine  (SYNTHROID, LEVOTHROID) 125 MCG tablet Take 125 mcg by mouth daily.     . NON FORMULARY Apply 1 application topically daily. [facility] Anti-fungal (Nail)  (3) Refills *added the urea 40% to the anti-fungal (nail) -#1    . predniSONE (DELTASONE) 2.5 MG tablet Take 1 tablet (2.5 mg total) by mouth daily. (Patient taking differently: Take 2.5 mg by mouth daily. ) 20 tablet 0  . RABEprazole (ACIPHEX) 20 MG tablet Take 1 tablet (20 mg total) by mouth 2 (two) times daily. (Patient taking differently: Take 20 mg by mouth 2 (two) times a day. ) 180 tablet 3  . sirolimus (RAPAMUNE) 1 MG tablet Take 1 mg by mouth daily.    Marland Kitchen trimethoprim (TRIMPEX) 100 MG tablet Take 100 mg by mouth daily.    . valACYclovir (VALTREX) 500 MG tablet Take 500 mg by mouth 2 (two) times daily as needed (for outbreak).      No current facility-administered medications for this visit.    REVIEW OF SYSTEMS:   A 10+ POINT REVIEW OF SYSTEMS WAS OBTAINED including neurology, dermatology, psychiatry, cardiac, respiratory, lymph, extremities, GI, GU, Musculoskeletal, constitutional, breasts, reproductive, HEENT.  All pertinent positives are noted in the HPI.  All others are negative.   PHYSICAL EXAMINATION:  NAD GENERAL:alert, in no acute distress and comfortable SKIN: no acute rashes, no significant lesions EYES: conjunctiva are pink and non-injected, sclera anicteric OROPHARYNX: MMM, no exudates, no oropharyngeal erythema or ulceration NECK: supple, no JVD LYMPH:  no palpable lymphadenopathy in the cervical, axillary or inguinal regions LUNGS: clear to auscultation b/l with normal respiratory effort HEART: regular rate & rhythm ABDOMEN:  normoactive bowel sounds , non tender, not distended. No palpable hepatosplenomegaly.  Extremity: no pedal edema PSYCH: alert & oriented x 3 with fluent speech NEURO: no  focal motor/sensory deficits  LABORATORY DATA:  I have reviewed the data as listed  . CBC Latest Ref Rng  & Units 08/31/2020 08/28/2020 08/20/2020  WBC 4.0 - 10.5 K/uL 10.2 10.9(H) 9.4  Hemoglobin 13.0 - 17.0 g/dL 14.3 14.7 14.8  Hematocrit 39 - 52 % 44.5 45.2 45.7  Platelets 150 - 400 K/uL 304 383 399    . CMP Latest Ref Rng & Units 08/28/2020 08/20/2020 07/27/2020  Glucose 70 - 99 mg/dL 97 91 93  BUN 6 - 20 mg/dL _0 Creatinine 0.61 - 1.24 mg/dL 1.45(H) 1.52(H) 1.52(H)  Sodium 135 - 145 mmol/L 140 137 137  Potassium 3.5 - 5.1 mmol/L 3.6 4.1 4.1  Chloride 98 - 111 mmol/L 103 104 100  CO2 22 - 32 mmol/L _1 Calcium 8.9 - 10.3 mg/dL 9.3 9.4 8.9  Total Protein 6.5 - 8.1 g/dL - 6.9 -  Total Bilirubin 0.3 - 1.2 mg/dL - 0.7 -  Alkaline Phos 38 - 126 U/L - 77 -  AST 15 - 41 U/L - 15 -  ALT 0 - 44 U/L - 13 -   . Lab Results  Component Value Date   IRON 89 08/20/2020   TIBC 243 08/20/2020   IRONPCTSAT 37 08/20/2020   (Iron and TIBC)  Lab Results  Component Value Date   FERRITIN 287 08/20/2020    05/20/2019 Surgical Pathology:   RADIOGRAPHIC STUDIES: I have personally reviewed the radiological images as listed and agreed with the findings in the report. DG Chest 2 View  Result Date: 08/03/2020 CLINICAL DATA:  Cough. EXAM: CHEST - 2 VIEW COMPARISON:  July 10, 2020. FINDINGS: The heart size and mediastinal contours are within normal limits. Sternotomy wires are noted. No pneumothorax or pleural effusion is noted. Both lungs are clear. The visualized skeletal structures are unremarkable. IMPRESSION: No active cardiopulmonary disease. Electronically Signed   By: Marijo Conception M.D.   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 Iron deficiency could have been an additional factor in reactive thrombocytosis 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 #3 Iron deficiency  anemia - resolved with IV iron replacement . Lab Results  Component Value Date   IRON 67 08/31/2020   TIBC 240 08/31/2020   IRONPCTSAT 28 08/31/2020   (Iron and TIBC)  Lab Results  Component Value Date   FERRITIN 272 08/31/2020    PLAN: - blood counts stable -iron -adequately replaced. -no indication for additional IV Iron replacement at this time  FOLLOW UP: RTC with Dr Irene Limbo with labs in 6 months  The total time spent in the appt was 20 minutes and more than 50% was on counseling and direct patient cares.  All of the patient's questions were answered with apparent satisfaction. The patient knows to call the clinic with any problems, questions or concerns.    Sullivan Lone MD [place] AAHIVMS [facility] Hematology/Oncology Physician [facility]  (Office):       [phone] (Work cell):  [phone] (Fax):           [phone]  08/31/2020 8:39 AM  I, Yevette Edwards, am acting as a scribe for Dr. Sullivan Lone.   .I have reviewed the above documentation for accuracy and completeness, and I agree with the above. [place]  MD

## 2020-09-06 ENCOUNTER — Other Ambulatory Visit (HOSPITAL_COMMUNITY)
Admission: RE | Admit: 2020-09-06 | Discharge: 2020-09-06 | Disposition: A | Discharge status: Home / Self Care | Payer: 59 | Source: Ambulatory Visit | Attending: Cardiology | Admitting: Cardiology

## 2020-09-06 DIAGNOSIS — Z48298 Encounter for aftercare following other organ transplant: Secondary | ICD-10-CM | POA: Insufficient documentation

## 2020-09-06 DIAGNOSIS — Z9225 Personal history of immunosupression therapy: Secondary | ICD-10-CM | POA: Diagnosis not present

## 2020-09-06 DIAGNOSIS — Z941 Heart transplant status: Secondary | ICD-10-CM | POA: Insufficient documentation

## 2020-09-06 LAB — BASIC METABOLIC PANEL
Anion gap: 11 (ref 5–15)
BUN: 18 mg/dL (ref 6–20)
CO2: 23 mmol/L (ref 22–32)
Calcium: 9.1 mg/dL (ref 8.9–10.3)
Chloride: 101 mmol/L (ref 98–111)
Creatinine, Ser: 1.39 mg/dL — ABNORMAL HIGH (ref 0.61–1.24)
GFR calc non Af Amer: 55 mL/min — ABNORMAL LOW (ref >60)
Glucose, Bld: 101 mg/dL — ABNORMAL HIGH (ref 70–99)
Potassium: 4.1 mmol/L (ref 3.5–5.1)
Sodium: 135 mmol/L (ref 135–145)

## 2020-09-08 LAB — TACROLIMUS LEVEL: Tacrolimus (FK506) - LabCorp: 5.5 ng/mL (ref 2.0–20.0)

## 2020-09-08 LAB — SIROLIMUS LEVEL: Sirolimus (Rapamycin): 4.2 ng/mL (ref 3.0–20.0)

## 2020-09-11 ENCOUNTER — Telehealth: Payer: Self-pay | Admitting: *Deleted

## 2020-09-11 NOTE — Telephone Encounter (Signed)
Contacted patient regarding test results per Dr. Grier Mitts directions. Patient verbalized understanding

## 2020-09-11 NOTE — Telephone Encounter (Signed)
-----   Message from [name], MD sent at 09/10/2020  8:33 AM EDT ----- Plz let patient know ferritin, iron sat adeqaute at this time -- no indication for additional IV Iron. thx

## 2020-10-04 ENCOUNTER — Other Ambulatory Visit: Payer: Self-pay | Admitting: Urology

## 2020-10-08 ENCOUNTER — Other Ambulatory Visit: Payer: Self-pay | Admitting: Urology

## 2020-11-26 ENCOUNTER — Other Ambulatory Visit (HOSPITAL_COMMUNITY)
Admission: RE | Admit: 2020-11-26 | Discharge: 2020-11-26 | Disposition: A | Discharge status: Home / Self Care | Payer: 59 | Source: Home / Self Care

## 2020-11-26 DIAGNOSIS — Z01812 Encounter for preprocedural laboratory examination: Secondary | ICD-10-CM | POA: Insufficient documentation

## 2020-11-26 LAB — BASIC METABOLIC PANEL
Anion gap: 14 (ref 5–15)
BUN: 30 mg/dL — ABNORMAL HIGH (ref 6–20)
CO2: 27 mmol/L (ref 22–32)
Calcium: 9.3 mg/dL (ref 8.9–10.3)
Chloride: 89 mmol/L — ABNORMAL LOW (ref 98–111)
Creatinine, Ser: 2.36 mg/dL — ABNORMAL HIGH (ref 0.61–1.24)
GFR, Estimated: 31 mL/min — ABNORMAL LOW (ref >60)
Glucose, Bld: 105 mg/dL — ABNORMAL HIGH (ref 70–99)
Potassium: 4.2 mmol/L (ref 3.5–5.1)
Sodium: 130 mmol/L — ABNORMAL LOW (ref 135–145)

## 2020-11-29 ENCOUNTER — Inpatient Hospital Stay (HOSPITAL_COMMUNITY): Payer: 59 | Admitting: Certified Registered Nurse Anesthetist

## 2020-11-29 ENCOUNTER — Other Ambulatory Visit: Payer: Self-pay | Admitting: Urology

## 2020-11-29 ENCOUNTER — Inpatient Hospital Stay (HOSPITAL_COMMUNITY): Payer: 59

## 2020-11-29 ENCOUNTER — Other Ambulatory Visit: Payer: Self-pay

## 2020-11-29 ENCOUNTER — Inpatient Hospital Stay (HOSPITAL_COMMUNITY)
Admission: AD | Admit: 2020-11-29 | Discharge: 2020-12-03 | DRG: 853 | Disposition: A | Discharge status: Home / Self Care | Payer: 59 | Source: Ambulatory Visit | Attending: Internal Medicine | Admitting: Internal Medicine

## 2020-11-29 ENCOUNTER — Encounter (HOSPITAL_COMMUNITY)
Admission: AD | Disposition: A | Discharge status: Home / Self Care | Payer: Self-pay | Source: Ambulatory Visit | Attending: Internal Medicine

## 2020-11-29 ENCOUNTER — Encounter (HOSPITAL_COMMUNITY): Payer: Self-pay | Admitting: Urology

## 2020-11-29 DIAGNOSIS — J9601 Acute respiratory failure with hypoxia: Secondary | ICD-10-CM | POA: Diagnosis present

## 2020-11-29 DIAGNOSIS — F32A Depression, unspecified: Secondary | ICD-10-CM | POA: Diagnosis present

## 2020-11-29 DIAGNOSIS — R52 Pain, unspecified: Secondary | ICD-10-CM | POA: Diagnosis not present

## 2020-11-29 DIAGNOSIS — E739 Lactose intolerance, unspecified: Secondary | ICD-10-CM | POA: Diagnosis present

## 2020-11-29 DIAGNOSIS — Z880 Allergy status to penicillin: Secondary | ICD-10-CM

## 2020-11-29 DIAGNOSIS — Z87442 Personal history of urinary calculi: Secondary | ICD-10-CM

## 2020-11-29 DIAGNOSIS — R0602 Shortness of breath: Secondary | ICD-10-CM

## 2020-11-29 DIAGNOSIS — Z7952 Long term (current) use of systemic steroids: Secondary | ICD-10-CM

## 2020-11-29 DIAGNOSIS — Z923 Personal history of irradiation: Secondary | ICD-10-CM | POA: Diagnosis not present

## 2020-11-29 DIAGNOSIS — J189 Pneumonia, unspecified organism: Secondary | ICD-10-CM | POA: Diagnosis not present

## 2020-11-29 DIAGNOSIS — N1330 Unspecified hydronephrosis: Secondary | ICD-10-CM | POA: Diagnosis present

## 2020-11-29 DIAGNOSIS — N182 Chronic kidney disease, stage 2 (mild): Secondary | ICD-10-CM | POA: Diagnosis present

## 2020-11-29 DIAGNOSIS — E871 Hypo-osmolality and hyponatremia: Secondary | ICD-10-CM | POA: Diagnosis present

## 2020-11-29 DIAGNOSIS — Z95810 Presence of automatic (implantable) cardiac defibrillator: Secondary | ICD-10-CM | POA: Diagnosis present

## 2020-11-29 DIAGNOSIS — R5382 Chronic fatigue, unspecified: Secondary | ICD-10-CM | POA: Diagnosis present

## 2020-11-29 DIAGNOSIS — Z79890 Hormone replacement therapy (postmenopausal): Secondary | ICD-10-CM

## 2020-11-29 DIAGNOSIS — R590 Localized enlarged lymph nodes: Secondary | ICD-10-CM | POA: Diagnosis present

## 2020-11-29 DIAGNOSIS — Z79899 Other long term (current) drug therapy: Secondary | ICD-10-CM

## 2020-11-29 DIAGNOSIS — Z8571 Personal history of Hodgkin lymphoma: Secondary | ICD-10-CM

## 2020-11-29 DIAGNOSIS — Z882 Allergy status to sulfonamides status: Secondary | ICD-10-CM

## 2020-11-29 DIAGNOSIS — Z8701 Personal history of pneumonia (recurrent): Secondary | ICD-10-CM

## 2020-11-29 DIAGNOSIS — M109 Gout, unspecified: Secondary | ICD-10-CM | POA: Diagnosis present

## 2020-11-29 DIAGNOSIS — Z8551 Personal history of malignant neoplasm of bladder: Secondary | ICD-10-CM

## 2020-11-29 DIAGNOSIS — Z8249 Family history of ischemic heart disease and other diseases of the circulatory system: Secondary | ICD-10-CM

## 2020-11-29 DIAGNOSIS — Z941 Heart transplant status: Secondary | ICD-10-CM

## 2020-11-29 DIAGNOSIS — Z888 Allergy status to other drugs, medicaments and biological substances status: Secondary | ICD-10-CM

## 2020-11-29 DIAGNOSIS — N179 Acute kidney failure, unspecified: Secondary | ICD-10-CM | POA: Diagnosis present

## 2020-11-29 DIAGNOSIS — Z20822 Contact with and (suspected) exposure to covid-19: Secondary | ICD-10-CM | POA: Diagnosis present

## 2020-11-29 DIAGNOSIS — I361 Nonrheumatic tricuspid (valve) insufficiency: Secondary | ICD-10-CM | POA: Diagnosis not present

## 2020-11-29 DIAGNOSIS — K219 Gastro-esophageal reflux disease without esophagitis: Secondary | ICD-10-CM | POA: Diagnosis present

## 2020-11-29 DIAGNOSIS — D84821 Immunodeficiency due to drugs: Secondary | ICD-10-CM | POA: Diagnosis present

## 2020-11-29 DIAGNOSIS — R6521 Severe sepsis with septic shock: Secondary | ICD-10-CM | POA: Diagnosis present

## 2020-11-29 DIAGNOSIS — Z85828 Personal history of other malignant neoplasm of skin: Secondary | ICD-10-CM

## 2020-11-29 DIAGNOSIS — J188 Other pneumonia, unspecified organism: Secondary | ICD-10-CM

## 2020-11-29 DIAGNOSIS — C7910 Secondary malignant neoplasm of unspecified urinary organs: Secondary | ICD-10-CM

## 2020-11-29 DIAGNOSIS — E039 Hypothyroidism, unspecified: Secondary | ICD-10-CM | POA: Diagnosis present

## 2020-11-29 DIAGNOSIS — R000 Tachycardia, unspecified: Secondary | ICD-10-CM

## 2020-11-29 DIAGNOSIS — N189 Chronic kidney disease, unspecified: Secondary | ICD-10-CM | POA: Diagnosis present

## 2020-11-29 DIAGNOSIS — Z9221 Personal history of antineoplastic chemotherapy: Secondary | ICD-10-CM

## 2020-11-29 DIAGNOSIS — Z818 Family history of other mental and behavioral disorders: Secondary | ICD-10-CM

## 2020-11-29 DIAGNOSIS — R0902 Hypoxemia: Secondary | ICD-10-CM

## 2020-11-29 DIAGNOSIS — A419 Sepsis, unspecified organism: Secondary | ICD-10-CM | POA: Diagnosis present

## 2020-11-29 HISTORY — PX: CYSTOSCOPY W/ URETERAL STENT PLACEMENT: SHX1429

## 2020-11-29 LAB — CBC WITH DIFFERENTIAL/PLATELET
Abs Immature Granulocytes: 0.06 10*3/uL (ref 0.00–0.07)
Basophils Absolute: 0 10*3/uL (ref 0.0–0.1)
Basophils Relative: 0 %
Eosinophils Absolute: 0 10*3/uL (ref 0.0–0.5)
Eosinophils Relative: 0 %
HCT: 39.6 % (ref 39.0–52.0)
Hemoglobin: 13.3 g/dL (ref 13.0–17.0)
Immature Granulocytes: 0 %
Lymphocytes Relative: 2 %
Lymphs Abs: 0.3 10*3/uL — ABNORMAL LOW (ref 0.7–4.0)
MCH: 31.9 pg (ref 26.0–34.0)
MCHC: 33.6 g/dL (ref 30.0–36.0)
MCV: 95 fL (ref 80.0–100.0)
Monocytes Absolute: 0.8 10*3/uL (ref 0.1–1.0)
Monocytes Relative: 5 %
Neutro Abs: 15.7 10*3/uL — ABNORMAL HIGH (ref 1.7–7.7)
Neutrophils Relative %: 93 %
Platelets: 381 10*3/uL (ref 150–400)
RBC: 4.17 MIL/uL — ABNORMAL LOW (ref 4.22–5.81)
RDW: 15.5 % (ref 11.5–15.5)
WBC: 16.8 10*3/uL — ABNORMAL HIGH (ref 4.0–10.5)
nRBC: 0 % (ref 0.0–0.2)

## 2020-11-29 LAB — URINALYSIS, ROUTINE W REFLEX MICROSCOPIC
Bacteria, UA: NONE SEEN
Bilirubin Urine: NEGATIVE
Glucose, UA: NEGATIVE mg/dL
Ketones, ur: NEGATIVE mg/dL
Nitrite: NEGATIVE
Protein, ur: NEGATIVE mg/dL
RBC / HPF: >50 RBC/hpf — ABNORMAL HIGH (ref 0–5)
Specific Gravity, Urine: 1.004 — ABNORMAL LOW (ref 1.005–1.030)
pH: 5 (ref 5.0–8.0)

## 2020-11-29 LAB — BASIC METABOLIC PANEL
Anion gap: 13 (ref 5–15)
Anion gap: 12 (ref 5–15)
BUN: 29 mg/dL — ABNORMAL HIGH (ref 6–20)
BUN: 32 mg/dL — ABNORMAL HIGH (ref 6–20)
CO2: 23 mmol/L (ref 22–32)
CO2: 22 mmol/L (ref 22–32)
Calcium: 8.5 mg/dL — ABNORMAL LOW (ref 8.9–10.3)
Calcium: 8.2 mg/dL — ABNORMAL LOW (ref 8.9–10.3)
Chloride: 89 mmol/L — ABNORMAL LOW (ref 98–111)
Chloride: 91 mmol/L — ABNORMAL LOW (ref 98–111)
Creatinine, Ser: 2.49 mg/dL — ABNORMAL HIGH (ref 0.61–1.24)
Creatinine, Ser: 2.29 mg/dL — ABNORMAL HIGH (ref 0.61–1.24)
GFR, Estimated: 29 mL/min — ABNORMAL LOW (ref >60)
GFR, Estimated: 32 mL/min — ABNORMAL LOW (ref >60)
Glucose, Bld: 92 mg/dL (ref 70–99)
Glucose, Bld: 122 mg/dL — ABNORMAL HIGH (ref 70–99)
Potassium: 3.9 mmol/L (ref 3.5–5.1)
Potassium: 4.1 mmol/L (ref 3.5–5.1)
Sodium: 125 mmol/L — ABNORMAL LOW (ref 135–145)
Sodium: 125 mmol/L — ABNORMAL LOW (ref 135–145)

## 2020-11-29 LAB — CBC
HCT: 43.1 % (ref 39.0–52.0)
Hemoglobin: 14 g/dL (ref 13.0–17.0)
MCH: 32 pg (ref 26.0–34.0)
MCHC: 32.5 g/dL (ref 30.0–36.0)
MCV: 98.4 fL (ref 80.0–100.0)
Platelets: 415 K/uL — ABNORMAL HIGH (ref 150–400)
RBC: 4.38 MIL/uL (ref 4.22–5.81)
RDW: 15.7 % — ABNORMAL HIGH (ref 11.5–15.5)
WBC: 14.8 K/uL — ABNORMAL HIGH (ref 4.0–10.5)
nRBC: 0 % (ref 0.0–0.2)

## 2020-11-29 LAB — SARS CORONAVIRUS 2 BY RT PCR (HOSPITAL ORDER, PERFORMED IN ~~LOC~~ HOSPITAL LAB): SARS Coronavirus 2: NEGATIVE

## 2020-11-29 SURGERY — CYSTOSCOPY, WITH RETROGRADE PYELOGRAM AND URETERAL STENT INSERTION
Anesthesia: General | Laterality: Right

## 2020-11-29 MED ORDER — LACTATED RINGERS IV SOLN: INTRAVENOUS | Status: DC

## 2020-11-29 MED ORDER — SODIUM CHLORIDE 0.9 % IR SOLN
Status: DC | PRN
  Administered 2020-11-29: 6000 mL

## 2020-11-29 MED ORDER — OXYCODONE HCL 5 MG/5ML PO SOLN: 5.0000 mg | Freq: Once | ORAL | Status: DC | PRN

## 2020-11-29 MED ORDER — HYDROMORPHONE HCL 1 MG/ML IJ SOLN: 0.2500 mg | INTRAMUSCULAR | Status: DC | PRN

## 2020-11-29 MED ORDER — FENTANYL CITRATE (PF) 100 MCG/2ML IJ SOLN
INTRAMUSCULAR | Status: DC | PRN
  Administered 2020-11-29 (×2): 50 ug via INTRAVENOUS

## 2020-11-29 MED ORDER — CEFAZOLIN SODIUM-DEXTROSE 2-4 GM/100ML-% IV SOLN
2.0000 g | Freq: Once | INTRAVENOUS | Status: AC
  Administered 2020-11-29: 18:00:00 2 g via INTRAVENOUS
  Filled 2020-11-29: qty 100

## 2020-11-29 MED ORDER — LACTATED RINGERS IV BOLUS (SEPSIS)
1000.0000 mL | Freq: Once | INTRAVENOUS | Status: AC
  Administered 2020-11-30: 1000 mL via INTRAVENOUS

## 2020-11-29 MED ORDER — AMISULPRIDE (ANTIEMETIC) 5 MG/2ML IV SOLN
INTRAVENOUS | Status: AC
  Filled 2020-11-29: qty 4

## 2020-11-29 MED ORDER — PROMETHAZINE HCL 25 MG/ML IJ SOLN: 6.2500 mg | INTRAMUSCULAR | Status: DC | PRN

## 2020-11-29 MED ORDER — PROPOFOL 1000 MG/100ML IV EMUL
INTRAVENOUS | Status: AC
  Filled 2020-11-29: qty 100

## 2020-11-29 MED ORDER — PROPOFOL 10 MG/ML IV BOLUS
INTRAVENOUS | Status: DC | PRN
Start: 2020-11-29 — End: 2020-11-29
  Administered 2020-11-29: 200 mg via INTRAVENOUS

## 2020-11-29 MED ORDER — OXYCODONE HCL 5 MG PO TABS
5.0000 mg | ORAL_TABLET | Freq: Once | ORAL | Status: DC | PRN
Start: 2020-11-29 — End: 2020-11-30

## 2020-11-29 MED ORDER — LACTATED RINGERS IV BOLUS (SEPSIS)
500.0000 mL | Freq: Once | INTRAVENOUS | Status: AC
  Administered 2020-11-30: 500 mL via INTRAVENOUS

## 2020-11-29 MED ORDER — LACTATED RINGERS IV BOLUS (SEPSIS): 1000.0000 mL | Freq: Once | INTRAVENOUS | Status: DC

## 2020-11-29 MED ORDER — PROPOFOL 10 MG/ML IV BOLUS
INTRAVENOUS | Status: AC
  Filled 2020-11-29: qty 20

## 2020-11-29 MED ORDER — AMISULPRIDE (ANTIEMETIC) 5 MG/2ML IV SOLN
10.0000 mg | Freq: Once | INTRAVENOUS | Status: AC | PRN
  Administered 2020-11-29: 19:00:00 10 mg via INTRAVENOUS

## 2020-11-29 MED ORDER — CALCIUM CARBONATE ANTACID 500 MG PO CHEW
800.0000 mg | CHEWABLE_TABLET | Freq: Once | ORAL | Status: AC
  Administered 2020-11-29: 20:00:00 800 mg via ORAL
  Filled 2020-11-29: qty 4

## 2020-11-29 MED ORDER — MEPERIDINE HCL 50 MG/ML IJ SOLN
6.2500 mg | INTRAMUSCULAR | Status: DC | PRN
  Administered 2020-11-29: 21:00:00 6.25 mg via INTRAVENOUS

## 2020-11-29 MED ORDER — IOHEXOL 300 MG/ML  SOLN
INTRAMUSCULAR | Status: DC | PRN
  Administered 2020-11-29: 18:00:00 20 mL

## 2020-11-29 MED ORDER — ONDANSETRON HCL 4 MG/2ML IJ SOLN
INTRAMUSCULAR | Status: AC
  Filled 2020-11-29: qty 2

## 2020-11-29 MED ORDER — SODIUM CHLORIDE 0.9 % IV SOLN: INTRAVENOUS | Status: DC

## 2020-11-29 MED ORDER — MEPERIDINE HCL 50 MG/ML IJ SOLN
INTRAMUSCULAR | Status: AC
  Filled 2020-11-29: qty 1

## 2020-11-29 MED ORDER — PROPOFOL 500 MG/50ML IV EMUL
INTRAVENOUS | Status: DC | PRN
  Administered 2020-11-29: 150 ug/kg/min via INTRAVENOUS

## 2020-11-29 MED ORDER — LIDOCAINE 2% (20 MG/ML) 5 ML SYRINGE
INTRAMUSCULAR | Status: DC | PRN
  Administered 2020-11-29: 80 mg via INTRAVENOUS

## 2020-11-29 MED ORDER — FENTANYL CITRATE (PF) 100 MCG/2ML IJ SOLN
INTRAMUSCULAR | Status: AC
  Filled 2020-11-29: qty 2

## 2020-11-29 MED ORDER — ONDANSETRON HCL 4 MG/2ML IJ SOLN
INTRAMUSCULAR | Status: DC | PRN
  Administered 2020-11-29: 8 mg via INTRAVENOUS

## 2020-11-29 MED ORDER — MIDAZOLAM HCL 2 MG/2ML IJ SOLN
INTRAMUSCULAR | Status: AC
  Filled 2020-11-29: qty 2

## 2020-11-29 MED ORDER — 0.9 % SODIUM CHLORIDE (POUR BTL) OPTIME
TOPICAL | Status: DC | PRN
  Administered 2020-11-29: 18:00:00 1000 mL

## 2020-11-29 SURGICAL SUPPLY — 19 items
BAG URO CATCHER STRL LF (MISCELLANEOUS) ×2 IMPLANT
BASKET ZERO TIP NITINOL 2.4FR (BASKET) IMPLANT
BSKT STON RTRVL ZERO TP 2.4FR (BASKET)
CATH INTERMIT  6FR 70CM (CATHETERS) IMPLANT
CATH URET 5FR 28IN OPEN ENDED (CATHETERS) ×1 IMPLANT
CLOTH BEACON ORANGE TIMEOUT ST (SAFETY) ×2 IMPLANT
GLOVE SURG ENC TEXT LTX SZ7.5 (GLOVE) ×2 IMPLANT
GOWN STRL REUS W/TWL XL LVL3 (GOWN DISPOSABLE) ×2 IMPLANT
GUIDEWIRE ANG ZIPWIRE 038X150 (WIRE) ×1 IMPLANT
GUIDEWIRE STR DUAL SENSOR (WIRE) ×2 IMPLANT
GUIDEWIRE ZIPWRE .038 STRAIGHT (WIRE) ×1 IMPLANT
KIT TURNOVER KIT A (KITS) IMPLANT
MANIFOLD NEPTUNE II (INSTRUMENTS) ×2 IMPLANT
PACK CYSTO (CUSTOM PROCEDURE TRAY) ×2 IMPLANT
STENT URET 6FRX26 CONTOUR (STENTS) IMPLANT
STENT URET 6FRX28 CONTOUR (STENTS) ×1 IMPLANT
SYR CONTROL 10ML LL (SYRINGE) ×1 IMPLANT
TUBING CONNECTING 10 (TUBING) ×2 IMPLANT
TUBING UROLOGY SET (TUBING) ×1 IMPLANT

## 2020-11-29 NOTE — Consult Note (Addendum)
Called by Dr. Junious Gonzalez about Glenn Gonzalez who underwent a stent placement in ureter.  Developed chills in PACU.  He has tachycardia with heart rate in the 120s 130s and oxygen saturations of 78 to 91%.  He does not complain of any chest pain or shortness of breath.  Patient has not had a work-up for these complaints and Dr. Patsy Gonzalez called for hospitalist to do the work-up in the PACU. I explained to Dr. Junious Gonzalez and spoke witth the nurse supervisor Glenn Gonzalez that the patient will need to be transferred to the emergency room for work-up as he may need urgent care that cannot be provided in the PACU.  He needs to have CBC, repeat electrolytes, chest x-ray.  He may require infusions, medications and supportive care if his condition were to deteriorate. Differential diagnosis includes sepsis, arrhythmia, electrolyte abnormality, etc.. Explained to the patient that we need to send to ER to stablize and workup condition that is causing the hypoxia and tachycardia so that treatment can be initiated quickly which cannot be done from PACU or the floor. At this time do not have clear picture of diagnosis to make appropriate treament decisions.    PACU nurse spoke to transplant coordinator at [facility] and rec for echo, do not fluid overload and check tacrolimus levels (Heart Transplant Coordinator on-call: [phone]).  AC Glenn Gonzalez is working to get an ER bed for the patient.  Ordered CBC, BMP, EKG and CXR to start workup for ER.   Pt is alert and oriented x3.  CV-sinus tachycardia. No murmur Lungs-Diminished breath sounds but clear.

## 2020-11-29 NOTE — Discharge Instructions (Signed)
Hydronephrosis  Hydronephrosis is the swelling of one or both kidneys due to a blockage that stops urine from flowing out of the body. Kidneys filter waste from the blood and produce urine. This condition can lead to kidney failure and may become life threatening if not treated promptly. What are the causes? Common causes of this condition include:  Problems that occur when a baby is developing in the womb (congenital defect). These can include problems: ? In the kidneys. ? In the tubes that drain urine from the kidneys into the bladder (ureters).  Kidney stones.  Bladder infection.  An enlarged prostate gland.  Scar tissue from a previous surgery or injury.  A blood clot.  A tumor or cyst in the abdomen or pelvis.  Cancer of the prostate, bladder, uterus, ovary, or colon. What are the signs or symptoms? Symptoms of this condition include:  Pain or discomfort in your side (flank).  Pain and swelling in your abdomen.  Nausea and vomiting.  Fever.  Pain when passing urine.  Feelings of urgency when you need to urinate.  Urinating more often than normal. In some cases, you may not have any symptoms. How is this diagnosed? This condition may be diagnosed based on:  Your symptoms and medical history.  A physical exam.  Blood and urine tests.  Imaging tests, such as an ultrasound, CT scan, or MRI.  A procedure in which a scope is inserted into the urethra and used to view parts of the urinary tract and bladder (cystoscopy). How is this treated? Treatment for this condition depends on where the blockage is, how long it has been there, and what caused it. The goal of treatment is to remove the blockage. Treatment may include:  Antibiotic medicines to treat or prevent infection.  A procedure to place a small, thin tube (stent) into a blocked ureter. The stent will keep the ureter open so that urine can drain through it.  A nonsurgical procedure that crushes  kidney stones with shock waves (extracorporeal shock wave lithotripsy).  If kidney failure occurs, treatment may include dialysis or a kidney transplant. Follow these instructions at home:   Take over-the-counter and prescription medicines only as told by your health care provider.  Rest and return to your normal activities as told by your health care provider. Ask your health care provider what activities are safe for you.  Drink enough fluid to keep your urine pale yellow.  If you were prescribed an antibiotic medicine, take it exactly as told by your health care provider. Do not stop taking the antibiotic even if you start to feel better.  Keep all follow-up visits as told by your health care provider. This is important. Contact a health care provider if:  You continue to have symptoms after treatment.  You develop new symptoms.  Your urine becomes cloudy or bloody.  You have a fever. Get help right away if:  You have severe flank or abdominal pain.  You cannot drink fluids without vomiting. Summary  Hydronephrosis is the swelling of one or both kidneys due to a blockage that stops urine from flowing out of the body.  Hydronephrosis can lead to kidney failure and may become life threatening if not treated promptly.  The goal of treatment is to treat the cause of the blockage. It may include insertion of stent into a blocked ureter, a procedure to treat kidney stones, and antibiotic medicines.  Follow your health care provider's instructions for taking care of yourself  at home, including instructions about drinking fluids, taking medicines, and limiting activities. This information is not intended to replace advice given to you by your health care provider. Make sure you discuss any questions you have with your health care provider. Document Revised: 11/28/2017 Document Reviewed: 11/28/2017 Elsevier Patient Education  [place]. Ureteral Stent Implantation, Care  After This sheet gives you information about how to care for yourself after your procedure. Your health care provider may also give you more specific instructions. If you have problems or questions, contact your health care provider. What can I expect after the procedure? After the procedure, it is common to have:  Nausea.  Mild pain when you urinate. You may feel this pain in your lower back or lower abdomen. The pain should stop within a few minutes after you urinate. This may last for up to 1 week.  A small amount of blood in your urine for several days. Follow these instructions at home: Medicines  Take over-the-counter and prescription medicines only as told by your health care provider.  If you were prescribed an antibiotic medicine, take it as told by your health care provider. Do not stop taking the antibiotic even if you start to feel better.  Do not drive for 24 hours if you were given a sedative during your procedure.  Ask your health care provider if the medicine prescribed to you requires you to avoid driving or using heavy machinery. Activity  Rest as told by your health care provider.  Avoid sitting for a long time without moving. Get up to take short walks every 1-2 hours. This is important to improve blood flow and breathing. Ask for help if you feel weak or unsteady.  Return to your normal activities as told by your health care provider. Ask your health care provider what activities are safe for you. General instructions   Watch for any blood in your urine. Call your health care provider if the amount of blood in your urine increases.  If you have a catheter: ? Follow instructions from your health care provider about taking care of your catheter and collection bag. ? Do not take baths, swim, or use a hot tub until your health care provider approves. Ask your health care provider if you may take showers. You may only be allowed to take sponge baths.  Drink enough  fluid to keep your urine pale yellow.  Do not use any products that contain nicotine or tobacco, such as cigarettes, e-cigarettes, and chewing tobacco. These can delay healing after surgery. If you need help quitting, ask your health care provider.  Keep all follow-up visits as told by your health care provider. This is important. Contact a health care provider if:  You have pain that gets worse or does not get better with medicine, especially pain when you urinate.  You have difficulty urinating.  You feel nauseous or you vomit repeatedly during a period of more than 2 days after the procedure. Get help right away if:  Your urine is dark red or has blood clots in it.  You are leaking urine (have incontinence).  The end of the stent comes out of your urethra.  You cannot urinate.  You have sudden, sharp, or severe pain in your abdomen or lower back.  You have a fever.  You have swelling or pain in your legs.  You have difficulty breathing. Summary  After the procedure, it is common to have mild pain when you urinate that  goes away within a few minutes after you urinate. This may last for up to 1 week.  Watch for any blood in your urine. Call your health care provider if the amount of blood in your urine increases.  Take over-the-counter and prescription medicines only as told by your health care provider.  Drink enough fluid to keep your urine pale yellow. This information is not intended to replace advice given to you by your health care provider. Make sure you discuss any questions you have with your health care provider. Document Revised: 08/24/2018 Document Reviewed: 08/25/2018 Elsevier Patient Education  2020 [employer].

## 2020-11-29 NOTE — Progress Notes (Signed)
Dr. Lutricia Feil with [facility] team given update on patient status.  Advised that patient should receive echocardiogram and Tacrolimus level, and be careful not to fluid overload patient.  Writer will update admitting physician with this information. Please contact Dr. Lutricia Feil directly at [phone] with any further questions or concerns.

## 2020-11-29 NOTE — Anesthesia Procedure Notes (Signed)
Procedure Name: LMA Insertion Date/Time: 11/29/2020 5:39 PM Performed by: Claudia Desanctis, CRNA Pre-anesthesia Checklist: Emergency Drugs available, Patient identified, Suction available and Patient being monitored Patient Re-evaluated:Patient Re-evaluated prior to induction Oxygen Delivery Method: Circle system utilized Preoxygenation: Pre-oxygenation with 100% oxygen Induction Type: IV induction Ventilation: Mask ventilation without difficulty LMA: LMA inserted LMA Size: 4.0 Number of attempts: 1 Placement Confirmation: positive ETCO2 and breath sounds checked- equal and bilateral Tube secured with: Tape Dental Injury: Teeth and Oropharynx as per pre-operative assessment

## 2020-11-29 NOTE — Anesthesia Postprocedure Evaluation (Signed)
Anesthesia Post Note  Patient: Glenn Gonzalez  Procedure(s) Performed: CYSTOSCOPY WITH RETROGRADE PYELOGRAM/URETERAL STENT PLACEMENT (Right )     Patient location during evaluation: PACU Anesthesia Type: General Level of consciousness: awake and alert Pain management: pain level controlled Vital Signs Assessment: post-procedure vital signs reviewed and stable Respiratory status: spontaneous breathing, nonlabored ventilation and respiratory function stable Cardiovascular status: blood pressure returned to baseline and stable Postop Assessment: no apparent nausea or vomiting Anesthetic complications: no   No complications documented.  Last Vitals:  Vitals:   11/29/20 1900 11/29/20 1915  BP: 133/84 137/86  Pulse: 94 97  Resp: (!) 22 12  Temp:    SpO2: 94% 95%    Last Pain:  Vitals:   11/29/20 1915  TempSrc:   PainSc: 0-No pain                 Lynda Rainwater

## 2020-11-29 NOTE — Progress Notes (Signed)
Over the course of PACU stay, patient's vitals have gone from NSR in the 80s to ST in the 130s.  Pt now requires 3L [place] to maintain sats in low 90s.  Dr. Sabra Heck (anesthesia) and Dr. Junious Silk (surgeon) are aware.  Dr. Junious Silk will call hospitalist to admit overnight for observation, especially considering patient's PMH.  Will update patient's heart Transplant Coordinator regarding current status per patient's request.  Please note:  The [facility] On-Call Transplant Coordinator's phone # is [phone].  Medical team should update this person as needed.

## 2020-11-29 NOTE — Progress Notes (Addendum)
Pt with HR 120-130s in PACU and sats 78-91. He does not c/o CP or SOB (recall s/p heart transplant).   Situation could be from urosepsis. There was no bacteria from the UA from OR but could be bacterial or fungal. I will send blood cultures. Pt is immunosuppressed.   PACU nurse spoke to transplant coordinator at [facility] and rec for echo, do not fluid overload and check tacrolimus levels (Heart Transplant Coordinator on-call: [phone]).  I spoke with Dr. Tonie Griffith twice to request consult and he will see pt ASAP. Wanted pt in ED for faster eval but ED is full. Also spoke with [facility] and she will look for a bed. Dr. Tonie Griffith came and examined pt and discussed with [facility]. Work-up in ED quicker as well as any needed therapeutics - gtts, etc.   Updated Lattie Haw.   Pt voided 350 ml in PACU.   Called and updated EDP prior to pt txfr.

## 2020-11-29 NOTE — H&P (Signed)
H&P  Chief Complaint: right hydronephrosis   History of Present Illness: Glenn Gonzalez is a 59 year old male who follows with Dr. Milford Cage with recent diagnosis of high-grade T1 bladder cancer.  He underwent BCG installations November and December 2021. His Nov 2021 office urine cx was negative. His creatinine has been rising from 1.01 June 2020 up to 2.49 today.  He was brought to the office today for ultrasound which revealed new right hydronephrosis.  Therefore he underwent a noncontrast CT scan which shows new right hydroureteronephrosis down to the proximal ureter and possibly obstructed by new retroperitoneal lymphadenopathy. Of note the left renal upper pole has some scarring and therefore the right kidney might provide more renal function than the left. He also has a history of cardiac transplant and lymphoma.  Given his rising creatinine and comorbidities he was brought semiurgently for the OR for cystoscopy with right ureteral stent placement prior to the [place] holiday weekend.  He has been well without dysuria, gross hematuria fever or chills.    Past Medical History:  Diagnosis Date  . A-fib ([place])    IN PAST with old heart   . Bladder tumor   . Bulging lumbar disc    states " i threw my back out on tuesday 6-16, i ahve a bulging disc, but i can lie on my back a side "   . Chronic fatigue   . Chronic kidney disease    CREATININE NOW 1.5 stage 1 ckd  . Colon cancer ([place]) DX 2019   SURGERY DONE  . Complication of anesthesia    AFTER HEART TRANSPLANT TOOK 2 YEARS TO GET BACK TO SELF; patient does not want aneshesia for endoscopy procedures   . Complication of anesthesia    likes propofol wants as few of anesthesia drugs as possible  . Depression    SITUATIONAL  . GERD (gastroesophageal reflux disease)   . Gout yrs ago  . History of chemotherapy 1982 for hodgkins   DAMAGED HEART MUSCLE WITH RADIATION ALSO  . History of kidney stones   . History of radiation therapy 1982   for  hodgkins lymphoma  . Hodgkin's lymphoma ([place]) 1980   IIIB. 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  . Hypothyroidism   . Pneumonia FROM CHF 2016   recurrent pneumonia sedf.  rad tx for lymphoma  . Skin abnormality    right chest small area with squamous cell area removed 3 weeks ago as of 07-27-2020  . Skin cancer    AREAS REMOVED FROM [facility], CHECK AND BACK AND HEAD SEPT 2019  . Status post heart transplantation ([place]) 2017   caused by chemo drug adriomycin   Past Surgical History:  Procedure Laterality Date  . BIOPSY  07/02/2018   Procedure: BIOPSY;  Surgeon: Carol Ada, MD;  [facility];  Service: Endoscopy;;  . BIOPSY  05/20/2019   Procedure: BIOPSY;  Surgeon: Carol Ada, MD;  [facility];  Service: Endoscopy;;  . CARDIAC CATHETERIZATION N/A 04/20/2015   Procedure: Right/Left Heart Cath and Coronary Angiography;  Surgeon: Jolaine Artist, MD;  Location: [place] [facility];  Service: Cardiovascular;  Laterality: N/A;  . CARDIAC CATHETERIZATION  10-11-15   pt states routine heart cath to be done at [facility]   . CARDIAC CATHETERIZATION N/A 04/02/2016   Procedure: Right Heart Cath;  Surgeon: Jolaine Artist, MD;  Location: [place] [facility];  Service: Cardiovascular;  Laterality: N/A;  . CARDIAC CATHETERIZATION  JULY 2019 AT [facility]  .  COLONOSCOPY N/A 07/02/2018   Procedure: COLONOSCOPY;  Surgeon: Carol Ada, MD;  [facility];  Service: Endoscopy;  Laterality: N/A;  . COLONOSCOPY N/A 08/05/2019   Procedure: COLONOSCOPY;  Surgeon: Carol Ada, MD;  [facility];  Service: Endoscopy;  Laterality: N/A;  . COLONOSCOPY WITH PROPOFOL N/A 08/03/2015   Procedure: COLONOSCOPY WITH PROPOFOL;  Surgeon: Carol Ada, MD;  [facility];  Service: Endoscopy;  Laterality: N/A;  wants to try to do procedure without sedation  . COLONOSCOPY WITH PROPOFOL N/A 08/10/2020   Procedure: COLONOSCOPY WITH PROPOFOL;  Surgeon: Carol Ada, MD;   [facility];  Service: Endoscopy;  Laterality: N/A;  . CYSTOSCOPY N/A 08/28/2020   Procedure: CYSTOSCOPY;  Surgeon: Remi Haggard, MD;  [facility];  Service: Urology;  Laterality: N/A;  . CYSTOSCOPY W/ RETROGRADES Bilateral 07/31/2020   Procedure: CYSTOSCOPY WITH RETROGRADE PYELOGRAM;  Surgeon: Remi Haggard, MD;  Location[facility];  Service: Urology;  Laterality: Bilateral;  . CYSTOSCOPY WITH INSERTION OF UROLIFT N/A 10/18/2018   Procedure: CYSTOSCOPY WITH INSERTION OF UROLIFT;  Surgeon: Cleon Gustin, MD;  Location: [facility];  Service: Urology;  Laterality: N/A;  30 MINS  . CYSTOSCOPY WITH RETROGRADE PYELOGRAM, URETEROSCOPY AND STENT PLACEMENT Right 10/12/2015   Procedure: CYSTOSCOPY WITH RETROGRADE PYELOGRAM, URETEROSCOPY AND STENT PLACEMENT;  Surgeon: Cleon Gustin, MD;  [facility];  Service: Urology;  Laterality: Right;  . ESOPHAGOGASTRODUODENOSCOPY    . ESOPHAGOGASTRODUODENOSCOPY (EGD) WITH PROPOFOL N/A 09/29/2019   Procedure: ESOPHAGOGASTRODUODENOSCOPY (EGD) WITH PROPOFOL;  Surgeon: Carol Ada, MD;  [facility];  Service: Endoscopy;  Laterality: N/A;  . ESOPHAGOGASTRODUODENOSCOPY (EGD) WITH PROPOFOL N/A 08/10/2020   Procedure: ESOPHAGOGASTRODUODENOSCOPY (EGD) WITH PROPOFOL;  Surgeon: Carol Ada, MD;  [facility];  Service: Endoscopy;  Laterality: N/A;  . EXPLORATORY LAPAROTOMY  1982   staging laparotomy  . FLEXIBLE SIGMOIDOSCOPY N/A 05/20/2019   Procedure: FLEXIBLE SIGMOIDOSCOPY;  Surgeon: Carol Ada, MD;  [facility];  Service: Endoscopy;  Laterality: N/A;  . FLEXIBLE SIGMOIDOSCOPY N/A 04/20/2020   Procedure: FLEXIBLE SIGMOIDOSCOPY;  Surgeon: Carol Ada, MD;  [facility];  Service: Endoscopy;  Laterality: N/A;  . gynemastia Bilateral   . HEART TRANSPLANT  06/20/2016   at [facility]  . HOT HEMOSTASIS N/A 07/02/2018   Procedure: HOT HEMOSTASIS (ARGON PLASMA  COAGULATION/BICAP);  Surgeon: Carol Ada, MD;  Location[facility];  Service: Endoscopy;  Laterality: N/A;  . LUMBAR LAMINECTOMY/DECOMPRESSION MICRODISCECTOMY Right 01/04/2016   Procedure: Microdiscectomy Lumbar four Lumbar five right;  Surgeon: Eustace Moore, MD;  Location: [place] [facility];  Service: Neurosurgery;  Laterality: Right;  . neck biopses     x5  . 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 POLYPECTOMY  07/02/2018   Procedure: POLYPECTOMY;  Surgeon: Carol Ada, MD;  [facility];  Service: Endoscopy;;  . POLYPECTOMY  08/05/2019   Procedure: POLYPECTOMY;  Surgeon: Carol Ada, MD;  [facility];  Service: Endoscopy;;  . POLYPECTOMY  04/20/2020   Procedure: POLYPECTOMY;  Surgeon: Carol Ada, MD;  [facility];  Service: Endoscopy;;  . POLYPECTOMY  08/10/2020   Procedure: POLYPECTOMY;  Surgeon: Carol Ada, MD;  [facility];  Service: Endoscopy;;  . PORTACATH PLACEMENT     06-29-15 inserted, now removed.  . SPLENECTOMY, TOTAL  1981  . STONE EXTRACTION WITH BASKET Right 10/12/2015   Procedure: STONE EXTRACTION WITH BASKET;  Surgeon: Cleon Gustin, MD;  [facility];  Service: Urology;  Laterality: Right;  . SUBMUCOSAL INJECTION  07/02/2018   Procedure: SUBMUCOSAL INJECTION;  Surgeon: Carol Ada, MD;  [facility];  Service: Endoscopy;;  . TONSILLECTOMY  as child  . TRANSURETHRAL RESECTION OF BLADDER TUMOR N/A 07/31/2020   Procedure: TRANSURETHRAL RESECTION OF BLADDER TUMOR (TURBT) FULGERATION;  Surgeon: Remi Haggard, MD;  Location: [facility];  Service: Urology;  Laterality: N/A;  . TRANSURETHRAL RESECTION OF BLADDER TUMOR N/A 08/28/2020   Procedure: TRANSURETHRAL RESECTION OF BLADDER TUMOR (TURBT);  Surgeon: Remi Haggard, MD;  [facility];  Service: Urology;  Laterality: N/A;  30 MINS  . VARICELE REPAIR  40 YRS AGO    Home Medications:   Medications Prior to Admission  Medication Sig Dispense Refill Last Dose  . acetaminophen (TYLENOL) 650 MG CR tablet Take 1,300 mg by mouth at bedtime.   Past Week at Unknown time  . allopurinol (ZYLOPRIM) 100 MG tablet Take 200 mg by mouth daily.   10 11/29/2020 at Unknown time  . Azelastine-Fluticasone-NaCl 137-50 & 0.9 MCG/ACT-% KIT Place 1 spray into the nose at bedtime.    Past Week at Unknown time  . Calcium Carb-Cholecalciferol (CALCIUM CARBONATE-VITAMIN D3) 600-400 MG-UNIT TABS Take 1 tablet by mouth 2 (two) times a day.    Past Week at Unknown time  . ENVARSUS XR 1 MG TB24 Take 2 mg by mouth daily.    11/29/2020 at Unknown time  . levothyroxine (SYNTHROID, LEVOTHROID) 125 MCG tablet Take 125 mcg by mouth daily.    11/29/2020 at Unknown time  . predniSONE (DELTASONE) 2.5 MG tablet Take 1 tablet (2.5 mg total) by mouth daily. (Patient taking differently: Take 2.5 mg by mouth daily.) 20 tablet 0 11/29/2020 at Unknown time  . cetirizine (ZYRTEC) 10 MG tablet Take 10 mg by mouth at bedtime.      Marland Kitchen ENVARSUS XR 0.75 MG TB24 Take 2 tablets by mouth daily.     . NON FORMULARY Apply 1 application topically daily. [facility] Anti-fungal (Nail)  (3) Refills *added the urea 40% to the anti-fungal (nail) -#1   Unknown at Unknown time  . RABEprazole (ACIPHEX) 20 MG tablet Take 1 tablet (20 mg total) by mouth 2 (two) times daily. (Patient taking differently: Take 20 mg by mouth 2 (two) times a day. ) 180 tablet 3   . sirolimus (RAPAMUNE) 1 MG tablet Take 1 mg by mouth daily.   Unknown at Unknown time  . trimethoprim (TRIMPEX) 100 MG tablet TAKE 1 TABLET BY MOUTH EVERY DAY 30 tablet 1 Unknown at Unknown time  . trimethoprim (TRIMPEX) 100 MG tablet TAKE 1 TABLET BY MOUTH EVERY DAY 30 tablet 11 Unknown at Unknown time  . valACYclovir (VALTREX) 500 MG tablet Take 500 mg by mouth 2 (two) times daily as needed (for outbreak).    Unknown at Unknown time   Allergies:  Allergies  Allergen Reactions   . Digoxin Other (See Comments)    gynecomastia   . Phencyclidine Other (See Comments)    PCP derived antibiotic > Insomnia  . Spironolactone Other (See Comments)    Gynecomastia  . Lactose Intolerance (Gi) Diarrhea and Other (See Comments)    cramps  . Penicillins Other (See Comments)    Cramps in stomach Did it involve swelling of the face/tongue/throat, SOB, or low BP? No Did it involve sudden or severe rash/hives, skin peeling, or any reaction on the inside of your mouth or nose? No Did you  need to seek medical attention at a hospital or doctor's office? No When did it last happen?unknown If all above answers are "NO", may proceed with cephalosporin use.     . Sulfur Rash    Family History  Problem Relation Age of Onset  . Hyperthyroidism Mother   . Insomnia Mother   . Hypertension Father   . Depression Father   . Insomnia Father    Social History:  reports that he has never smoked. He has never used smokeless tobacco. He reports that he does not drink alcohol and does not use drugs.  ROS: A complete review of systems was performed.  All systems are negative except for pertinent findings as noted. Review of Systems  All other systems reviewed and are negative.    Physical Exam:  Vital signs in last 24 hours: Temp:  [99.1 F (37.3 C)] 99.1 F (37.3 C) (12/30 1511) Pulse Rate:  [88] 88 (12/30 1511) Resp:  [20] 20 (12/30 1511) BP: (121)/(81) 121/81 (12/30 1511) SpO2:  [100 %] 100 % (12/30 1511) Weight:  [100.2 kg] 100.2 kg (12/30 1444) General:  Alert and oriented, No acute distress HEENT: Normocephalic, atraumatic Cardiovascular: Regular rate and rhythm Lungs: Regular rate and effort Abdomen: Soft, nontender, nondistended, no abdominal masses Back: No CVA tenderness Extremities: No edema Neurologic: Grossly intact  Laboratory Data:  Results for orders placed or performed during the hospital encounter of 11/29/20 (from the past 24 hour(s))  SARS  Coronavirus 2 by RT PCR (hospital order, performed in [place] hospital lab) Nasopharyngeal Nasopharyngeal Swab     Status: None   Collection Time: 11/29/20  2:45 PM   Specimen: Nasopharyngeal Swab  Result Value Ref Range   SARS Coronavirus 2 NEGATIVE NEGATIVE  CBC     Status: Abnormal   Collection Time: 11/29/20  3:16 PM  Result Value Ref Range   WBC 14.8 (H) 4.0 - 10.5 K/uL   RBC 4.38 4.22 - 5.81 MIL/uL   Hemoglobin 14.0 13.0 - 17.0 g/dL   HCT 43.1 39.0 - 52.0 %   MCV 98.4 80.0 - 100.0 fL   MCH 32.0 26.0 - 34.0 pg   MCHC 32.5 30.0 - 36.0 g/dL   RDW 15.7 (H) 11.5 - 15.5 %   Platelets 415 (H) 150 - 400 K/uL   nRBC 0.0 0.0 - 0.2 %  Basic metabolic panel     Status: Abnormal   Collection Time: 11/29/20  3:20 PM  Result Value Ref Range   Sodium 125 (L) 135 - 145 mmol/L   Potassium 3.9 3.5 - 5.1 mmol/L   Chloride 89 (L) 98 - 111 mmol/L   CO2 23 22 - 32 mmol/L   Glucose, Bld 92 70 - 99 mg/dL   BUN 29 (H) 6 - 20 mg/dL   Creatinine, Ser 2.49 (H) 0.61 - 1.24 mg/dL   Calcium 8.5 (L) 8.9 - 10.3 mg/dL   GFR, Estimated 29 (L) >60 mL/min   Anion gap 13 5 - 15   Recent Results (from the past 240 hour(s))  SARS Coronavirus 2 by RT PCR (hospital order, performed in [place] hospital lab) Nasopharyngeal Nasopharyngeal Swab     Status: None   Collection Time: 11/29/20  2:45 PM   Specimen: Nasopharyngeal Swab  Result Value Ref Range Status   SARS Coronavirus 2 NEGATIVE NEGATIVE Final    Comment: (NOTE) SARS-CoV-2 target nucleic acids are NOT DETECTED.  The SARS-CoV-2 RNA is generally detectable in upper and lower respiratory specimens during the  acute phase of infection. The lowest concentration of SARS-CoV-2 viral copies this assay can detect is 250 copies / mL. A negative result does not preclude SARS-CoV-2 infection and should not be used as the sole basis for treatment or other patient management decisions.  A negative result may occur with improper specimen collection /  handling, submission of specimen other than nasopharyngeal swab, presence of viral mutation(s) within the areas targeted by this assay, and inadequate number of viral copies (<250 copies / mL). A negative result must be combined with clinical observations, patient history, and epidemiological information.  Fact Sheet for Patients:   StrictlyIdeas.no  Fact Sheet for Healthcare Providers: BankingDealers.co.za  This test is not yet approved or  cleared by the [place] FDA and has been authorized for detection and/or diagnosis of SARS-CoV-2 by FDA under an Emergency Use Authorization (EUA).  This EUA will remain in effect (meaning this test can be used) for the duration of the COVID-19 declaration under Section 564(b)(1) of the Act, 21 U.S.C. section 360bbb-3(b)(1), unless the authorization is terminated or revoked sooner.  Performed at [facility], [place] [address], [place], [place] [address]    Creatinine: Recent Labs    11/26/20 1008 11/29/20 1520  CREATININE 2.36* 2.49*    Impression/Assessment/plan:  Right hydronephrosis with left upper pole scarring and rising creatinine - I reviewed the patient and CT scan images with Dr. Milford Cage who recommended stent placement and spoke with his patient. Patient was seen and examined in the pre-op area. I discussed with the patient in pre-op area the nature, potential benefits, risks and alternatives to cystoscopy, right RGP, right ureteral stent placement, including side effects of the proposed treatment, the likelihood of the patient achieving the goals of the procedure, and any potential problems that might occur during the procedure or recuperation. All questions answered. Patient elects to proceed.    Festus Aloe 11/29/2020, 5:01 PM

## 2020-11-29 NOTE — Anesthesia Preprocedure Evaluation (Signed)
Anesthesia Evaluation  Patient identified by MRN, date of birth, ID band Patient awake    Reviewed: Allergy & Precautions, NPO status , Patient's Chart, lab work & pertinent test results  Airway Mallampati: I  TM Distance: >3 FB Neck ROM: Full    Dental  (+) Teeth Intact, Dental Advisory Given   Pulmonary neg pulmonary ROS,    breath sounds clear to auscultation       Cardiovascular + dysrhythmias Atrial Fibrillation  Rhythm:Regular Rate:Normal  - s/p Heart Transplant   Neuro/Psych PSYCHIATRIC DISORDERS Depression negative neurological ROS     GI/Hepatic GERD  ,  Endo/Other  Hypothyroidism   Renal/GU CRF and Renal InsufficiencyRenal disease     Musculoskeletal negative musculoskeletal ROS (+)   Abdominal Normal abdominal exam  (+)   Peds  Hematology   Anesthesia Other Findings   Reproductive/Obstetrics                             Anesthesia Physical  Anesthesia Plan  ASA: III  Anesthesia Plan: General   Post-op Pain Management:    Induction: Intravenous  PONV Risk Score and Plan: 2 and Ondansetron and TIVA  Airway Management Planned: LMA  Additional Equipment: None  Intra-op Plan:   Post-operative Plan: Extubation in OR  Informed Consent: I have reviewed the patients History and Physical, chart, labs and discussed the procedure including the risks, benefits and alternatives for the proposed anesthesia with the patient or authorized representative who has indicated his/her understanding and acceptance.     Dental advisory given  Plan Discussed with: CRNA  Anesthesia Plan Comments: (             )        Anesthesia Quick Evaluation

## 2020-11-29 NOTE — Transfer of Care (Signed)
Immediate Anesthesia Transfer of Care Note  Patient: Glenn Gonzalez  Procedure(s) Performed: CYSTOSCOPY WITH RETROGRADE PYELOGRAM/URETERAL STENT PLACEMENT (Right )  Patient Location: PACU  Anesthesia Type:General  Level of Consciousness: awake, alert , oriented and patient cooperative  Airway & Oxygen Therapy: Patient Spontanous Breathing and Patient connected to face mask  Post-op Assessment: Report given to RN and Post -op Vital signs reviewed and stable  Post vital signs: Reviewed and stable  Last Vitals:  Vitals Value Taken Time  BP    Temp 36.9 C 11/29/20 1823  Pulse    Resp    SpO2      Last Pain:  Vitals:   11/29/20 1511  TempSrc: Oral         Complications: No complications documented.

## 2020-11-29 NOTE — Op Note (Signed)
Preoperative diagnosis: Right hydronephrosis, chronic renal failure Postoperative diagnosis: Same  Procedure: Cystoscopy with right retrograde pyelogram right ureteral stent placement  Surgeon: Junious Silk  Anesthesia: General  Indication for procedure: [name] is a 59 year old male undergoing treatment of bladder cancer with Dr. Milford Cage which was a high-grade T1 lesion.  He underwent BCG.  His creatinine has risen from 1.2 up to 2.49 today.  On CT there was right hydroureteronephrosis of the collecting system and proximal ureter possible retroperitoneal lymphadenopathy on the CT.  Upper pole the left kidney is scarred and smaller.  He is brought today for semiurgent right ureteral stent prior to holiday weekend.  With close follow-up planned in the office in 4 days for kidney function check.  Findings: On cystoscopy the urethra was unremarkable.  Prostate with some mild enlargement and a small median lobe.  Bladder was visualized with a 30 degree and 70 degree lens and no papillary lesions were noted.  There was some right posterior edema and mild erythema of the mucosa but no tumor recurrence.  Ureteral orifice ease appeared normal and in their normal orthotopic position.  Right retrograde pyelogram-this outlined a single ureter single collecting system unit.  The distal and mid ureter were thin and delicate in the proximal ureter there was a filling defect and considerable abnormality of the renal pelvis and most of the collecting system except for an upper pole infundibulum and calyx filled out normally.  The remainder seem to be infiltrated.  Description of procedure: After consent was obtained patient brought to the operating room.  After adequate anesthesia was placed lithotomy position and prepped and draped in the usual sterile fashion.  A timeout was performed to confirm the patient and procedure.  Cystoscope was passed per urethra and the bladder inspected with a 30 degree and 70 degree  lens.  The right ureteral orifice was then cannulated with a 5 [place] open-ended catheter.  A sensor wire was then advanced into the collecting system and coiled and over the wire the catheter advanced into the collecting system and the wire removed.  There was a hydronephrotic drip but not all that brisk.  More contrast was injected with only an upper pole infundibulum and calyx appearing normal with the remainder of the collecting system and renal pelvis with abnormal filling defect.  The sensor wire was then readvanced and the open-ended catheter removed.  A 6 x 26 cm stent advanced and the wire removed.  There was some resistance as can be typical of sensor wires but the proximal coil did not develop and I was not sure if it was completely in the renal pelvis and I did not want to slip down and out of position.  Therefore the right stent was grasped and removed through the meatus and I passed a zip wire up and was able to navigate this up into the upper calyx.  Open-ended catheter readvanced and retrograde contrast performed again with the upper calyx and infundibulum filling normally with the rest abnormal.  This time the Glidewire was readvanced and I was able to coil it in the upper calyx and the open-ended catheter was removed.  I passed a 6 x 28 cm ureteral stent and this time the Glidewire removed quite easily with a coil seen in the upper calyx and a good coil in the bladder.  I was happy about the stent position.  Some debris laden or bloody urine drained from the right kidney.  Specimen was collected and sent for urinalysis  and culture.  The bladder was drained and the scope removed.  He was then awakened taken to cover room in stable condition.  Complications: None  Blood loss: Minimal  Specimens to lab: Cath urine for urinalysis, C&S  Drains: 6 x 28 cm right ureteral stent  Disposition: Patient stable to PACU-I called and updated his wife.

## 2020-11-29 NOTE — H&P (Signed)
History and Physical    Glenn Gonzalez UVO:[id number] DOB: [birth year] DOA: 11/29/2020  PCP: Lajean Manes, MD   Patient coming from: Home via PACU  Chief Complaint: Tachycardia, tachypnea  HPI: Glenn Gonzalez is a 59 y.o. male with medical history significant for high-grade T1 bladder cancer, s/p cardiac transplant.  He underwent BCG installations November and December 2021. His Nov 2021 office urine cx was negative. His creatinine has been rising from 1.01 June 2020 up to 2.49 today.  He was seen in urology office today for ultrasound which revealed new right hydronephrosis.  Therefore he underwent a noncontrast CT scan which shows new right hydroureteronephrosis down to the proximal ureter and possibly obstructed by new retroperitoneal lymphadenopathy. Of note the left renal upper pole has some scarring and therefore the right kidney might provide more renal function than the left. He also has a history of cardiac transplant and lymphoma.  Given his rising creatinine and comorbidities he was brought semiurgently for the OR for cystoscopy with right ureteral stent placement prior to the [place] holiday weekend.  He has been well without dysuria, gross hematuria.  After the stent was placed the patient developed chills, tachycardia and tachypnea with hypoxia in the PACU.  Labs, EKG,  CXR were ordered which showed multifocal pneumonia.  Labs showed leukocytosis and hyponatremia.  Worsening renal function with elevation of his creatinine to 2.29 now.  His had baseline of 1-1.2 for his creatinine.  Hospital service was consulted by urology, Dr. Junious Silk. Pt will be admitted for further workup and management.   Review of Systems:  General: Reports subjective fever and chills. Reports weakness. Denies weight loss, night sweats.  Denies dizziness.  Denies change in appetite HENT: Denies head trauma, headache, denies change in hearing, tinnitus.  Denies nasal congestion or bleeding.  Denies sore  throat, sores in mouth.  Denies difficulty swallowing Eyes: Denies blurry vision, pain in eye, drainage.  Denies discoloration of eyes. Neck: Denies pain.  Denies swelling.  Denies pain with movement. Cardiovascular: Denies chest pain, palpitations.  Denies edema.  Denies orthopnea Respiratory: Denies shortness of breath, cough.  Denies wheezing.  Denies sputum production Gastrointestinal: Denies abdominal pain, swelling.  Denies nausea, vomiting, diarrhea.  Denies melena.  Denies hematemesis. Musculoskeletal: Denies limitation of movement.  Denies deformity or swelling.  Denies arthralgias or myalgias. Genitourinary: Denies pelvic pain.  Denies urinary frequency or hesitancy.  Denies dysuria.  Skin: Denies rash.  Denies petechiae, purpura, ecchymosis. Neurological: Denies headache.  Denies syncope.  Denies seizure activity.  Denies paresthesia.  Denies slurred speech, drooping face.  Denies visual change. Psychiatric: Denies depression, anxiety.  Denies hallucinations.  Past Medical History:  Diagnosis Date  . A-fib ([place])    IN PAST with old heart   . Bladder tumor   . Bulging lumbar disc    states " i threw my back out on tuesday 6-16, i ahve a bulging disc, but i can lie on my back a side "   . Chronic fatigue   . Chronic kidney disease    CREATININE NOW 1.5 stage 1 ckd  . Colon cancer ([place]) DX 2019   SURGERY DONE  . Complication of anesthesia    AFTER HEART TRANSPLANT TOOK 2 YEARS TO GET BACK TO SELF; patient does not want aneshesia for endoscopy procedures   . Complication of anesthesia    likes propofol wants as few of anesthesia drugs as possible  . Depression    SITUATIONAL  . GERD (gastroesophageal  reflux disease)   . Gout yrs ago  . History of chemotherapy 1982 for hodgkins   DAMAGED HEART MUSCLE WITH RADIATION ALSO  . History of kidney stones   . History of radiation therapy 1982   for hodgkins lymphoma  . Hodgkin's lymphoma ([place]) 1980   IIIB. x30 yrs in remission-no  follow ups at this time.  . Hypothyroidism   . Pneumonia FROM CHF 2016   recurrent pneumonia sedf.  rad tx for lymphoma  . Skin abnormality    right chest small area with squamous cell area removed 3 weeks ago as of 07-27-2020  . Skin cancer    AREAS REMOVED FROM [facility], CHECK AND BACK AND HEAD SEPT 2019  . Status post heart transplantation ([place]) 2017   caused by chemo drug adriomycin    Past Surgical History:  Procedure Laterality Date  . BIOPSY  07/02/2018   Procedure: BIOPSY;  Surgeon: Carol Ada, MD;  [facility];  Service: Endoscopy;;  . BIOPSY  05/20/2019   Procedure: BIOPSY;  Surgeon: Carol Ada, MD;  [facility];  Service: Endoscopy;;  . CARDIAC CATHETERIZATION N/A 04/20/2015   Procedure: Right/Left Heart Cath and Coronary Angiography;  Surgeon: Jolaine Artist, MD;  Location: [place] [facility];  Service: Cardiovascular;  Laterality: N/A;  . CARDIAC CATHETERIZATION  10-11-15   pt states routine heart [facility] to [facility]   . CARDIAC CATHETERIZATION N/A 04/02/2016   Procedure: Right Heart Cath;  Surgeon: Jolaine Artist, MD;  Location: [place] CV LAB;  Service: Cardiovascular;  Laterality: N/A;  . CARDIAC CATHETERIZATION  JULY 2019 AT [facility]  . COLONOSCOPY N/A 07/02/2018   Procedure: COLONOSCOPY;  Surgeon: Carol Ada, MD;  [facility];  Service: Endoscopy;  Laterality: N/A;  . COLONOSCOPY N/A 08/05/2019   Procedure: COLONOSCOPY;  Surgeon: Carol Ada, MD;  [facility];  Service: Endoscopy;  Laterality: N/A;  . COLONOSCOPY WITH PROPOFOL N/A 08/03/2015   Procedure: COLONOSCOPY WITH PROPOFOL;  Surgeon: Carol Ada, MD;  [facility];  Service: Endoscopy;  Laterality: N/A;  wants to try to do procedure without sedation  . COLONOSCOPY WITH PROPOFOL N/A 08/10/2020   Procedure: COLONOSCOPY WITH PROPOFOL;  Surgeon: Carol Ada, MD;  [facility];  Service: Endoscopy;  Laterality: N/A;  . CYSTOSCOPY N/A 08/28/2020    Procedure: CYSTOSCOPY;  Surgeon: Remi Haggard, MD;  [facility];  Service: Urology;  Laterality: N/A;  . CYSTOSCOPY W/ RETROGRADES Bilateral 07/31/2020   Procedure: CYSTOSCOPY WITH RETROGRADE PYELOGRAM;  Surgeon: Remi Haggard, MD;  Location: [facility];  Service: Urology;  Laterality: Bilateral;  . CYSTOSCOPY WITH INSERTION OF UROLIFT N/A 10/18/2018   Procedure: CYSTOSCOPY WITH INSERTION OF UROLIFT;  Surgeon: Cleon Gustin, MD;  [facility];  Service: Urology;  Laterality: N/A;  30 MINS  . CYSTOSCOPY WITH RETROGRADE PYELOGRAM, URETEROSCOPY AND STENT PLACEMENT Right 10/12/2015   Procedure: CYSTOSCOPY WITH RETROGRADE PYELOGRAM, URETEROSCOPY AND STENT PLACEMENT;  Surgeon: Cleon Gustin, MD;  [facility];  Service: Urology;  Laterality: Right;  . ESOPHAGOGASTRODUODENOSCOPY    . ESOPHAGOGASTRODUODENOSCOPY (EGD) WITH PROPOFOL N/A 09/29/2019   Procedure: ESOPHAGOGASTRODUODENOSCOPY (EGD) WITH PROPOFOL;  Surgeon: Carol Ada, MD;  [facility];  Service: Endoscopy;  Laterality: N/A;  . ESOPHAGOGASTRODUODENOSCOPY (EGD) WITH PROPOFOL N/A 08/10/2020   Procedure: ESOPHAGOGASTRODUODENOSCOPY (EGD) WITH PROPOFOL;  Surgeon: Carol Ada, MD;  [facility];  Service: Endoscopy;  Laterality: N/A;  . [place]  staging laparotomy  . FLEXIBLE SIGMOIDOSCOPY N/A 05/20/2019   Procedure: FLEXIBLE SIGMOIDOSCOPY;  Surgeon: Carol Ada, MD;  [facility];  Service: Endoscopy;  Laterality: N/A;  . FLEXIBLE SIGMOIDOSCOPY N/A 04/20/2020   Procedure: FLEXIBLE SIGMOIDOSCOPY;  Surgeon: Carol Ada, MD;  [facility];  Service: Endoscopy;  Laterality: N/A;  . gynemastia Bilateral   . HEART TRANSPLANT  06/20/2016   at [facility]  . HOT HEMOSTASIS N/A 07/02/2018   Procedure: HOT HEMOSTASIS (ARGON PLASMA COAGULATION/BICAP);  Surgeon: Carol Ada, MD;  [facility];  Service: Endoscopy;   Laterality: N/A;  . LUMBAR LAMINECTOMY/DECOMPRESSION MICRODISCECTOMY Right 01/04/2016   Procedure: Microdiscectomy Lumbar four Lumbar five right;  Surgeon: Eustace Moore, MD;  Location: [place] [facility];  Service: Neurosurgery;  Laterality: Right;  . neck biopses     x5  . 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 POLYPECTOMY  07/02/2018   Procedure: POLYPECTOMY;  Surgeon: Carol Ada, MD;  [facility];  Service: Endoscopy;;  . POLYPECTOMY  08/05/2019   Procedure: POLYPECTOMY;  Surgeon: Carol Ada, MD;  [facility];  Service: Endoscopy;;  . POLYPECTOMY  04/20/2020   Procedure: POLYPECTOMY;  Surgeon: Carol Ada, MD;  [facility];  Service: Endoscopy;;  . POLYPECTOMY  08/10/2020   Procedure: POLYPECTOMY;  Surgeon: Carol Ada, MD;  [facility];  Service: Endoscopy;;  . PORTACATH PLACEMENT     06-29-15 inserted, now removed.  . SPLENECTOMY, TOTAL  1981  . STONE EXTRACTION WITH BASKET Right 10/12/2015   Procedure: STONE EXTRACTION WITH BASKET;  Surgeon: Cleon Gustin, MD;  [facility];  Service: Urology;  Laterality: Right;  . SUBMUCOSAL INJECTION  07/02/2018   Procedure: SUBMUCOSAL INJECTION;  Surgeon: Carol Ada, MD;  [facility];  Service: Endoscopy;;  . TONSILLECTOMY  as child  . TRANSURETHRAL RESECTION OF BLADDER TUMOR N/A 07/31/2020   Procedure: TRANSURETHRAL RESECTION OF BLADDER TUMOR (TURBT) FULGERATION;  Surgeon: Remi Haggard, MD;  Location: [facility];  Service: Urology;  Laterality: N/A;  . TRANSURETHRAL RESECTION OF BLADDER TUMOR N/A 08/28/2020   Procedure: TRANSURETHRAL RESECTION OF BLADDER TUMOR (TURBT);  Surgeon: Remi Haggard, MD;  [facility];  Service: Urology;  Laterality: N/A;  30 MINS  . VARICELE REPAIR  40 YRS AGO    Social History  reports that he has never smoked. He has never used smokeless tobacco. He reports that he does not drink  alcohol and does not use drugs.  Allergies  Allergen Reactions  . Digoxin Other (See Comments)    gynecomastia   . Phencyclidine Other (See Comments)    PCP derived antibiotic > Insomnia  . Spironolactone Other (See Comments)    Gynecomastia  . Lactose Intolerance (Gi) Diarrhea and Other (See Comments)    cramps  . Penicillins Other (See Comments)    Cramps in stomach Did it involve swelling of the face/tongue/throat, SOB, or low BP? No Did it involve sudden or severe rash/hives, skin peeling, or any reaction on the inside of your mouth or nose? No Did you need to seek medical attention at a hospital or doctor's office? No When did it last happen?unknown If all above answers are "NO", may proceed with cephalosporin use.     . Sulfur Rash    Family History  Problem Relation Age of Onset  . Hyperthyroidism Mother   . Insomnia Mother   . Hypertension Father   . Depression Father   .  Insomnia Father      Prior to Admission medications   Medication Sig Start Date End Date Taking? Authorizing Provider  acetaminophen (TYLENOL) 650 MG CR tablet Take 1,300 mg by mouth at bedtime.   Yes [provider]  allopurinol (ZYLOPRIM) 100 MG tablet Take 200 mg by mouth daily.  01/04/18  Yes [provider]  Azelastine-Fluticasone-NaCl 137-50 & 0.9 MCG/ACT-% KIT Place 1 spray into the nose at bedtime.    Yes [provider]  Calcium Carb-Cholecalciferol (CALCIUM CARBONATE-VITAMIN D3) 600-400 MG-UNIT TABS Take 1 tablet by mouth 2 (two) times a day.    Yes [provider]  ENVARSUS XR 1 MG TB24 Take 2 mg by mouth daily.  12/22/17  Yes [provider]  levothyroxine (SYNTHROID, LEVOTHROID) 125 MCG tablet Take 125 mcg by mouth daily.  01/09/16  Yes [provider]  predniSONE (DELTASONE) 2.5 MG tablet Take 1 tablet (2.5 mg total) by mouth daily. Patient taking differently: Take 2.5 mg by mouth daily. 02/25/18  Yes Regalado, Belkys A, MD   cetirizine (ZYRTEC) 10 MG tablet Take 10 mg by mouth at bedtime.     [provider]  ENVARSUS XR 0.75 MG TB24 Take 2 tablets by mouth daily. 08/07/20   [provider]  NON FORMULARY Apply 1 application topically daily. [facility] Anti-fungal (Nail)  (3) Refills *added the urea 40% to the anti-fungal (nail) -#1    [provider]  RABEprazole (ACIPHEX) 20 MG tablet Take 1 tablet (20 mg total) by mouth 2 (two) times daily. Patient taking differently: Take 20 mg by mouth 2 (two) times a day.  03/07/11   Bensimhon, Shaune Pascal, MD  sirolimus (RAPAMUNE) 1 MG tablet Take 1 mg by mouth daily.    [provider]  trimethoprim (TRIMPEX) 100 MG tablet TAKE 1 TABLET BY MOUTH EVERY DAY 10/11/20   McKenzie, Candee Furbish, MD  trimethoprim (TRIMPEX) 100 MG tablet TAKE 1 TABLET BY MOUTH EVERY DAY 10/11/20   McKenzie, Candee Furbish, MD  valACYclovir (VALTREX) 500 MG tablet Take 500 mg by mouth 2 (two) times daily as needed (for outbreak).     [provider]    Physical Exam: Vitals:   11/29/20 2145 11/29/20 2235 11/29/20 2245 11/29/20 2300  BP: 115/71 (!) 97/45 (!) 89/53 (!) 88/59  Pulse: (!) 124 (!) 120 (!) 118 (!) 116  Resp: (!) 21 18 (!) 22 20  Temp:  98.1 F (36.7 C)    TempSrc:      SpO2: 96% 96% 96% 97%  Weight:      Height:        Constitutional: NAD, calm, comfortable Vitals:   11/29/20 2145 11/29/20 2235 11/29/20 2245 11/29/20 2300  BP: 115/71 (!) 97/45 (!) 89/53 (!) 88/59  Pulse: (!) 124 (!) 120 (!) 118 (!) 116  Resp: (!) 21 18 (!) 22 20  Temp:  98.1 F (36.7 C)    TempSrc:      SpO2: 96% 96% 96% 97%  Weight:      Height:       General: WDWN, Alert and oriented x3.  Eyes: EOMI, PERRL, conjunctivae normal.  Sclera nonicteric HENT:  [place]/AT, external ears normal.  Nares patent without epistasis.  Mucous membranes are moist. Posterior pharynx clear of any exudate or lesions.  Neck: Soft, normal range of motion, supple, no masses, no  thyromegaly.  Trachea midline Respiratory: Sounds are equal but diminished.  Diffuse scattered rales., no wheezing, no crackles. Normal respiratory effort. No  accessory muscle use.  Cardiovascular:  Sinus tachycardia. no murmurs / rubs / gallops. No extremity edema. 2+ pedal pulses.  Abdomen: Soft, no tenderness, nondistended, no rebound or guarding.  No masses palpated. Bowel sounds normoactive Musculoskeletal: FROM. no clubbing / cyanosis. No joint deformity upper and lower extremities.  Normal muscle tone.  Skin: Warm, dry, intact no rashes, lesions, ulcers. No induration Neurologic: CN 2-12 grossly intact.  Normal speech.  Sensation intact.   Psychiatric: Normal judgment and insight.  Normal mood.    Labs on Admission: I have personally reviewed following labs and imaging studies  CBC: Recent Labs  Lab 11/29/20 1516 11/29/20 2250  WBC 14.8* 16.8*  NEUTROABS  --  15.7*  HGB 14.0 13.3  HCT 43.1 39.6  MCV 98.4 95.0  PLT 415* 341    Basic Metabolic Panel: Recent Labs  Lab 11/26/20 1008 11/29/20 1520 11/29/20 2250  NA 130* 125* 125*  K 4.2 3.9 4.1  CL 89* 89* 91*  CO2 _0 GLUCOSE 105* 92 122*  BUN 30* 29* 32*  CREATININE 2.36* 2.49* 2.29*  CALCIUM 9.3 8.5* 8.2*    GFR: Estimated Creatinine Clearance: 43.9 mL/min (A) (by C-G formula based on SCr of 2.29 mg/dL (H)).  Liver Function Tests: No results for input(s): AST, ALT, ALKPHOS, BILITOT, PROT, ALBUMIN in the last 168 hours.  Urine analysis:    Component Value Date/Time   COLORURINE STRAW (A) 11/29/2020 1835   APPEARANCEUR HAZY (A) 11/29/2020 1835   LABSPEC 1.004 (L) 11/29/2020 1835   PHURINE 5.0 11/29/2020 [place] 11/29/2020 1835   HGBUR LARGE (A) 11/29/2020 1835   HGBUR negative 05/12/2008 0957   BILIRUBINUR NEGATIVE 11/29/2020 [place] 11/29/2020 [place] NEGATIVE 11/29/2020 1835   UROBILINOGEN 0.2 07/31/2015 2311   NITRITE NEGATIVE 11/29/2020 1835    LEUKOCYTESUR SMALL (A) 11/29/2020 1835    Radiological Exams on Admission: DG Chest 1 View  Result Date: 11/29/2020 CLINICAL DATA:  Hypoxia EXAM: CHEST  1 VIEW COMPARISON:  08/03/2020 FINDINGS: Since the prior examination, there has developed right basilar consolidation, possibly infectious in the appropriate clinical setting. Mild focal infiltrate is also noted within the right apex. Left lung is clear. No pneumothorax or pleural effusion. Median sternotomy has been performed. Cardiac size within normal limits. Multiple healed right rib fractures are noted. Multiple surgical clips are seen within the right axilla. IMPRESSION: Interval development of multifocal pulmonary infiltrate within the right lung, more focal within the right lung base, suspicious for atypical infection in the acute setting. Electronically Signed   By: Fidela Salisbury MD   On: 11/29/2020 22:27   DG C-Arm 1-60 Min-No Report  Result Date: 11/29/2020 Fluoroscopy was utilized by the requesting physician.  No radiographic interpretation.    EKG: Independently reviewed.   Assessment/Plan Principal Problem:   Multifocal pneumonia Glenn Gonzalez is admitted to progressive care unit.  Started on Rocephin and azithromycin for antibiotic coverage. Started on IV fluids with LR Albuterol MDI as needed for shortness of breath, cough, wheeze Incentive spirometer every hour while awake.  Active Problems:   Hypoxia Supplemental oxygen as needed keep O2 sat between 92 to 96%.  Patient has multifocal pneumonia on chest x-ray and has been started on therapy. Could not rule out PE as D-dimer would not be accurate as patient had surgical procedure today and he cannot have a CT angiography secondary to his renal function.  Obtain VQ scan in the morning  Sepsis Pt meets sepsis criteria with pneumonia on CXR, tachycardia and tachypnea with elevated WBC.  Patient placed on antibiotics with Rocephin and azithromycin.  Given bolus of IV  fluids. Given IVF bolus.  Monitor lactic acid level    Acute kidney injury superimposed on CKD  Creatinine is increased from baseline. IVF hydration with LR provided Recheck electrolytes and renal function with labs in am.      Hyponatremia Pt with low sodium level. IVF hydration provided. Monitor electrolytes.    Tachycardia HR in 120-130 range. EKG shows sinus tachycardia. Hemodynamically stable.  Will monitor heart rate with treatment of pneumonia. As cannot rule out PE as patient cannot have CT angiography due to his renal function will obtain VQ scan in the morning     Hydronephrosis Per urology. S/p stent placement.     Status post heart transplantation (HCC) Chronic. Continue immunosuppressive regimen     DVT prophylaxis: Patient is placed on heparin infusion for therapeutic anticoagulation as PE cannot be ruled out at this time Code Status:   Full code Family Communication:  Diagnosis plan discussed with patient.  Further recommendation to follow as clinically indicated Disposition Plan:   Patient is from:  Home  Anticipated DC to:  Home  Anticipated DC date:  Anticipate at least 2 midnight stay in the hospital  Anticipated DC barriers: No barriers to discharge identified at this time  Admission status:  Inpatient   Yevonne Aline Carter Kassel MD [facility]  How to contact the [facility] Attending or Consulting provider [place] or covering provider during after hours [place], for this patient?   1. Check the care team in [facility] and look for a) attending/consulting TR[facility] provider listed and b) the [facility] team listed 2. Log into [email or web address] and use [place]'s universal password to access. If you do not have the password, please contact the hospital operator. 3. Locate the [facility] provider you are looking for under [facility] and page to a number that you can be directly reached. 4. If you still have difficulty reaching the provider, please page the [facility] (Director on Call)  for the Hospitalists listed on amion for assistance.  11/29/2020, 11:44 PM

## 2020-11-30 ENCOUNTER — Encounter (HOSPITAL_COMMUNITY): Payer: Self-pay | Admitting: Urology

## 2020-11-30 ENCOUNTER — Inpatient Hospital Stay (HOSPITAL_COMMUNITY): Payer: 59

## 2020-11-30 DIAGNOSIS — R0602 Shortness of breath: Secondary | ICD-10-CM | POA: Diagnosis not present

## 2020-11-30 DIAGNOSIS — J189 Pneumonia, unspecified organism: Secondary | ICD-10-CM | POA: Diagnosis not present

## 2020-11-30 DIAGNOSIS — I361 Nonrheumatic tricuspid (valve) insufficiency: Secondary | ICD-10-CM

## 2020-11-30 LAB — TROPONIN I (HIGH SENSITIVITY)
Troponin I (High Sensitivity): 8 ng/L (ref <18)
Troponin I (High Sensitivity): 6 ng/L (ref <18)
Troponin I (High Sensitivity): 8 ng/L (ref <18)

## 2020-11-30 LAB — CBC
HCT: 39.5 % (ref 39.0–52.0)
Hemoglobin: 12.9 g/dL — ABNORMAL LOW (ref 13.0–17.0)
MCH: 31.9 pg (ref 26.0–34.0)
MCHC: 32.7 g/dL (ref 30.0–36.0)
MCV: 97.5 fL (ref 80.0–100.0)
Platelets: 370 10*3/uL (ref 150–400)
RBC: 4.05 MIL/uL — ABNORMAL LOW (ref 4.22–5.81)
RDW: 15.8 % — ABNORMAL HIGH (ref 11.5–15.5)
WBC: 22.4 10*3/uL — ABNORMAL HIGH (ref 4.0–10.5)
nRBC: 0 % (ref 0.0–0.2)

## 2020-11-30 LAB — ECHOCARDIOGRAM COMPLETE
Area-P 1/2: 5.38 cm2
Height: 74 in
S' Lateral: 3.2 cm
Single Plane A4C EF: 62.1 %
Weight: 3536 oz

## 2020-11-30 LAB — LACTIC ACID, PLASMA
Lactic Acid, Venous: 1.2 mmol/L (ref 0.5–1.9)
Lactic Acid, Venous: 1.8 mmol/L (ref 0.5–1.9)

## 2020-11-30 LAB — STREP PNEUMONIAE URINARY ANTIGEN: Strep Pneumo Urinary Antigen: NEGATIVE

## 2020-11-30 LAB — BASIC METABOLIC PANEL
Anion gap: 11 (ref 5–15)
BUN: 30 mg/dL — ABNORMAL HIGH (ref 6–20)
CO2: 24 mmol/L (ref 22–32)
Calcium: 8.2 mg/dL — ABNORMAL LOW (ref 8.9–10.3)
Chloride: 92 mmol/L — ABNORMAL LOW (ref 98–111)
Creatinine, Ser: 2.46 mg/dL — ABNORMAL HIGH (ref 0.61–1.24)
GFR, Estimated: 29 mL/min — ABNORMAL LOW (ref >60)
Glucose, Bld: 116 mg/dL — ABNORMAL HIGH (ref 70–99)
Potassium: 4.1 mmol/L (ref 3.5–5.1)
Sodium: 127 mmol/L — ABNORMAL LOW (ref 135–145)

## 2020-11-30 LAB — HEPARIN LEVEL (UNFRACTIONATED)
Heparin Unfractionated: <0.1 IU/mL — ABNORMAL LOW (ref 0.30–0.70)
Heparin Unfractionated: 0.21 IU/mL — ABNORMAL LOW (ref 0.30–0.70)
Heparin Unfractionated: 0.25 IU/mL — ABNORMAL LOW (ref 0.30–0.70)

## 2020-11-30 LAB — BRAIN NATRIURETIC PEPTIDE: B Natriuretic Peptide: 121.1 pg/mL — ABNORMAL HIGH (ref 0.0–100.0)

## 2020-11-30 LAB — PROTIME-INR
INR: 1 (ref 0.8–1.2)
Prothrombin Time: 12.9 seconds (ref 11.4–15.2)

## 2020-11-30 LAB — MRSA PCR SCREENING: MRSA by PCR: NEGATIVE

## 2020-11-30 LAB — HIV ANTIBODY (ROUTINE TESTING W REFLEX): HIV Screen 4th Generation wRfx: NONREACTIVE

## 2020-11-30 LAB — RESP PANEL BY RT-PCR (FLU A&B, COVID) ARPGX2
Influenza A by PCR: NEGATIVE
Influenza B by PCR: NEGATIVE
SARS Coronavirus 2 by RT PCR: NEGATIVE

## 2020-11-30 LAB — APTT: aPTT: 22 seconds — ABNORMAL LOW (ref 24–36)

## 2020-11-30 MED ORDER — RABEPRAZOLE SODIUM 20 MG PO TBEC
20.0000 mg | DELAYED_RELEASE_TABLET | Freq: Two times a day (BID) | ORAL | Status: DC
  Administered 2020-11-30 – 2020-12-02 (×5): 20 mg via ORAL

## 2020-11-30 MED ORDER — ACETAMINOPHEN 650 MG RE SUPP: 650.0000 mg | Freq: Four times a day (QID) | RECTAL | Status: DC | PRN

## 2020-11-30 MED ORDER — CALCIUM CARBONATE ANTACID 500 MG PO CHEW
4.0000 | CHEWABLE_TABLET | ORAL | Status: DC | PRN
  Administered 2020-11-30 – 2020-12-01 (×2): 800 mg via ORAL
  Filled 2020-11-30: qty 4

## 2020-11-30 MED ORDER — HEPARIN BOLUS VIA INFUSION
3000.0000 [IU] | Freq: Once | INTRAVENOUS | Status: DC
  Filled 2020-11-30: qty 3000

## 2020-11-30 MED ORDER — TACROLIMUS ER 0.75 MG PO TB24
1.5000 mg | ORAL_TABLET | Freq: Every day | ORAL | Status: DC
  Administered 2020-12-01 – 2020-12-02 (×2): 1.5 mg via ORAL

## 2020-11-30 MED ORDER — ONDANSETRON HCL 4 MG PO TABS: 4.0000 mg | ORAL_TABLET | Freq: Four times a day (QID) | ORAL | Status: DC | PRN

## 2020-11-30 MED ORDER — AZELASTINE-FLUTICASONE 137-50 MCG/ACT NA SUSP
1.0000 | Freq: Two times a day (BID) | NASAL | Status: DC
  Administered 2020-12-01 – 2020-12-02 (×4): 1 via NASAL

## 2020-11-30 MED ORDER — LACTATED RINGERS IV SOLN: INTRAVENOUS | Status: DC

## 2020-11-30 MED ORDER — ALLOPURINOL 100 MG PO TABS
200.0000 mg | ORAL_TABLET | Freq: Every day | ORAL | Status: DC
  Administered 2020-11-30 – 2020-12-03 (×4): 200 mg via ORAL
  Filled 2020-11-30: qty 2

## 2020-11-30 MED ORDER — CHLORHEXIDINE GLUCONATE CLOTH 2 % EX PADS: 6.0000 | MEDICATED_PAD | Freq: Every day | CUTANEOUS | Status: DC

## 2020-11-30 MED ORDER — SIROLIMUS 1 MG PO TABS
1.0000 mg | ORAL_TABLET | Freq: Every day | ORAL | Status: DC
  Filled 2020-11-30: qty 1

## 2020-11-30 MED ORDER — GUAIFENESIN ER 600 MG PO TB12
600.0000 mg | ORAL_TABLET | Freq: Two times a day (BID) | ORAL | Status: DC
  Administered 2020-11-30 – 2020-12-02 (×6): 600 mg via ORAL
  Filled 2020-11-30 (×5): qty 1

## 2020-11-30 MED ORDER — ONDANSETRON HCL 4 MG/2ML IJ SOLN: 4.0000 mg | Freq: Four times a day (QID) | INTRAMUSCULAR | Status: DC | PRN

## 2020-11-30 MED ORDER — HEPARIN (PORCINE) 25000 UT/250ML-% IV SOLN
2000.0000 [IU]/h | INTRAVENOUS | Status: DC
  Administered 2020-11-30 – 2020-12-01 (×2): 2100 [IU]/h via INTRAVENOUS
  Filled 2020-11-30: qty 250

## 2020-11-30 MED ORDER — ACETAMINOPHEN 325 MG PO TABS
650.0000 mg | ORAL_TABLET | Freq: Four times a day (QID) | ORAL | Status: DC | PRN
  Administered 2020-12-01 – 2020-12-03 (×6): 650 mg via ORAL
  Filled 2020-11-30 (×6): qty 2

## 2020-11-30 MED ORDER — SODIUM CHLORIDE 0.9 % IV SOLN
500.0000 mg | INTRAVENOUS | Status: DC
  Administered 2020-11-30 – 2020-12-01 (×2): 500 mg via INTRAVENOUS
  Filled 2020-11-30 (×2): qty 500

## 2020-11-30 MED ORDER — TACROLIMUS ER 1 MG PO TB24: 2.0000 mg | ORAL_TABLET | Freq: Every day | ORAL | Status: DC

## 2020-11-30 MED ORDER — PERFLUTREN LIPID MICROSPHERE
1.0000 mL | INTRAVENOUS | Status: AC | PRN
  Administered 2020-11-30: 2 mL via INTRAVENOUS
  Filled 2020-11-30: qty 10

## 2020-11-30 MED ORDER — LEVOTHYROXINE SODIUM 25 MCG PO TABS
125.0000 ug | ORAL_TABLET | Freq: Every day | ORAL | Status: DC
  Administered 2020-11-30 – 2020-12-03 (×4): 125 ug via ORAL
  Filled 2020-11-30 (×3): qty 1

## 2020-11-30 MED ORDER — SODIUM CHLORIDE 0.9 % IV SOLN
1.0000 g | INTRAVENOUS | Status: DC
  Administered 2020-11-30 – 2020-12-03 (×4): 1 g via INTRAVENOUS
  Filled 2020-11-30: qty 1
  Filled 2020-11-30: qty 10
  Filled 2020-11-30 (×2): qty 1

## 2020-11-30 MED ORDER — HEPARIN (PORCINE) 25000 UT/250ML-% IV SOLN
1900.0000 [IU]/h | INTRAVENOUS | Status: DC
  Administered 2020-11-30: 1900 [IU]/h via INTRAVENOUS
  Administered 2020-11-30: 1700 [IU]/h via INTRAVENOUS
  Filled 2020-11-30 (×2): qty 250

## 2020-11-30 MED ORDER — PREDNISONE 5 MG PO TABS
2.5000 mg | ORAL_TABLET | Freq: Every day | ORAL | Status: DC
  Administered 2020-11-30 – 2020-12-03 (×4): 2.5 mg via ORAL
  Filled 2020-11-30 (×2): qty 1

## 2020-11-30 MED ORDER — ALBUTEROL SULFATE HFA 108 (90 BASE) MCG/ACT IN AERS
2.0000 | INHALATION_SPRAY | RESPIRATORY_TRACT | Status: DC | PRN
  Filled 2020-11-30: qty 6.7

## 2020-11-30 MED ORDER — CETIRIZINE HCL 10 MG PO TABS
10.0000 mg | ORAL_TABLET | Freq: Every day | ORAL | Status: DC
  Administered 2020-12-01 – 2020-12-02 (×3): 10 mg via ORAL
  Filled 2020-11-30: qty 1

## 2020-11-30 NOTE — Plan of Care (Signed)
Pt has voided multiple times.

## 2020-11-30 NOTE — Progress Notes (Signed)
ANTICOAGULATION CONSULT NOTE - Initial Consult  Pharmacy Consult for heparin Indication: pulmonary embolus  Allergies  Allergen Reactions  . Digoxin Other (See Comments)    gynecomastia   . Phencyclidine Other (See Comments)    PCP derived antibiotic > Insomnia  . Spironolactone Other (See Comments)    Gynecomastia  . Lactose Intolerance (Gi) Diarrhea and Other (See Comments)    cramps  . Penicillins Other (See Comments)    Cramps in stomach Did it involve swelling of the face/tongue/throat, SOB, or low BP? No Did it involve sudden or severe rash/hives, skin peeling, or any reaction on the inside of your mouth or nose? No Did you need to seek medical attention at a hospital or doctor's office? No When did it last happen?unknown If all above answers are "NO", may proceed with cephalosporin use.     . Sulfur Rash    Patient Measurements: Height: 6\' 2"  (188 cm) Weight: 100.2 kg (221 lb) IBW/kg (Calculated) : 82.2 Heparin Dosing Weight: TBW  Vital Signs: Temp: 98.1 F (36.7 C) (12/30 2235) Temp Source: Oral (12/30 1511) BP: 93/55 (12/30 2345) Pulse Rate: 110 (12/30 2345)  Labs: Recent Labs    11/29/20 1516 11/29/20 1520 11/29/20 2250  HGB 14.0  --  13.3  HCT 43.1  --  39.6  PLT 415*  --  381  CREATININE  --  2.49* 2.29*    Estimated Creatinine Clearance: 43.9 mL/min (A) (by C-G formula based on SCr of 2.29 mg/dL (H)).   Medical History: Past Medical History:  Diagnosis Date  . A-fib ([place])    IN PAST with old heart   . Bladder tumor   . Bulging lumbar disc    states " i threw my back out on tuesday 6-16, i ahve a bulging disc, but i can lie on my back a side "   . Chronic fatigue   . Chronic kidney disease    CREATININE NOW 1.5 stage 1 ckd  . Colon cancer ([place]) DX 2019   SURGERY DONE  . Complication of anesthesia    AFTER HEART TRANSPLANT TOOK 2 YEARS TO GET BACK TO SELF; patient does not want aneshesia for endoscopy procedures   . Complication  of anesthesia    likes propofol wants as few of anesthesia drugs as possible  . Depression    SITUATIONAL  . GERD (gastroesophageal reflux disease)   . Gout yrs ago  . History of chemotherapy 1982 for hodgkins   DAMAGED HEART MUSCLE WITH RADIATION ALSO  . History of kidney stones   . History of radiation therapy 1982   for hodgkins lymphoma  . Hodgkin's lymphoma ([place]) 1980   IIIB. 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  . Hypothyroidism   . Pneumonia FROM CHF 2016   recurrent pneumonia sedf.  rad tx for lymphoma  . Skin abnormality    right chest small area with squamous cell area removed 3 weeks ago as of 07-27-2020  . Skin cancer    AREAS REMOVED FROM [facility], CHECK AND BACK AND HEAD SEPT 2019  . Status post heart transplantation ([place]) 2017   caused by chemo drug adriomycin     Assessment: 59 yo male undergoing treatment for bladder cancer brought in today for semiurgent right ureteral stent.  Pt has history of cardiac transplant and lymphoma.  Pt developed chills and tachycardia in PACU.  Pharmacy consulted to dose heparin for possible PE.  No prior AC noted  11/30/2020 CBC WNL Scr  2.29 Baseline labs ordered STAT  Goal of Therapy:  Heparin level 0.3-0.7 units/ml Monitor platelets by anticoagulation protocol   Plan:  Using [place] bolus heparin 3000 units x 1 then start  Heparin drip at 1700 units/hr Heparin level in 6 hours F/u VQ scan results Daily CBC  Dolly Rias RPh 11/30/2020, 12:02 AM

## 2020-11-30 NOTE — Progress Notes (Signed)
1 Day Post-Op   Subjective/Chief Complaint:  1- RIGHT Hydronephrosis, Acute on Chronic Renal Insufficiency, Possible Malignant Obstruction - new Rt hydro 10/2020 and retroperitoneal adenopathy on eval Cr 2.4 up from baseline 1.4. Underwent Rt ureteral stent placement 12/30. H/o lymphoma and bladder cancer. Adenopathy is new compared to 07/2020.   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 Treated with induction BCG 10/2020. 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   3 - Hypoxia, Tachycardia, Possible Pneumonia After Ureteral Stent - new hypoxia / tachycardia in PACU after uncomplcated Rt stent placement 11/29/20. Has done well with anesthesia numerous times recent prior. [place] 12/24 pending, [place] 12/24 pending (negative x several this year prior).  CXR ? New PNA, Lactat normal, TropI negative so far, placed on empiric Rocephin. WBC 22k.C19 screen negative.   Today " Ed" remains quite ill. WBC 22k (normal  baselie), some low grade fevers, overall picture most c/w infectios process. Feeling much better subjectively.    Objective: Vital signs in last 24 hours: Temp:  [97.9 F (36.6 C)-99.1 F (37.3 C)] 98 F (36.7 C) (12/31 0100) Pulse Rate:  [88-133] 95 (12/31 0600) Resp:  [12-30] 18 (12/31 0600) BP: (83-140)/(45-92) 103/59 (12/31 0600) SpO2:  [89 %-100 %] 90 % (12/31 0600) Weight:  [100.2 kg] 100.2 kg (12/30 1444) Last BM Date: 11/29/20  Intake/Output from previous day: 12/30 0701 - 12/31 0700 In: 1780 [P.O.:480; I.V.:1200; IV Piggyback:100] Out: 350 [Urine:350] Intake/Output this shift: No intake/output data recorded.  General appearance: alert, cooperative and very pleasant, AOx3, wife at bedside.  Eyes: negative Nose: Nares normal. Septum midline. Mucosa normal. No drainage or sinus tenderness. Throat: lips, mucosa, and tongue normal; teeth and gums normal Neck:  supple, symmetrical, trachea midline Back: symmetric, no curvature. ROM normal. No CVA tenderness. Chest wall: no tenderness Cardio: HR 90s by monitor.  GI: soft, non-tender; bowel sounds normal; no masses,  no organomegaly and multiple scars w/o hernias.  Male genitalia: normal Extremities: extremities normal, atraumatic, no cyanosis or edema Skin: Skin color, texture, turgor normal. No rashes or lesions Lymph nodes: Cervical, supraclavicular, and axillary nodes normal. Neurologic: Grossly normal  Lab Results:  Recent Labs    11/29/20 2250 11/30/20 0255  WBC 16.8* 22.4*  HGB 13.3 12.9*  HCT 39.6 39.5  PLT 381 370   BMET Recent Labs    11/29/20 2250 11/30/20 0255  NA 125* 127*  K 4.1 4.1  CL 91* 92*  CO2 22 24  GLUCOSE 122* 116*  BUN 32* 30*  CREATININE 2.29* 2.46*  CALCIUM 8.2* 8.2*   PT/INR Recent Labs    11/30/20 0031  LABPROT 12.9  INR 1.0   ABG No results for input(s): PHART, HCO3 in the last 72 hours.  Invalid input(s): PCO2, PO2  Studies/Results: DG Chest 1 View  Result Date: 11/29/2020 CLINICAL DATA:  Hypoxia EXAM: CHEST  1 VIEW COMPARISON:  08/03/2020 FINDINGS: Since the prior examination, there has developed right basilar consolidation, possibly infectious in the appropriate clinical setting. Mild focal infiltrate is also noted within the right apex. Left lung is clear. No pneumothorax or pleural effusion. Median sternotomy has been performed. Cardiac size within normal limits. Multiple healed right rib fractures are noted. Multiple surgical clips are seen within the right axilla. IMPRESSION: Interval development of multifocal pulmonary infiltrate within the right lung, more focal within the right lung base, suspicious for atypical infection in the acute  setting. Electronically Signed   By: Fidela Salisbury MD   On: 11/29/2020 22:27   DG C-Arm 1-60 Min-No Report  Result Date: 11/29/2020 Fluoroscopy was utilized by the requesting physician.  No  radiographic interpretation.    Anti-infectives: Anti-infectives (From admission, onward)   Start     Dose/Rate Route Frequency Ordered Stop   11/30/20 0230  cefTRIAXone (ROCEPHIN) 1 g in sodium chloride 0.9 % 100 mL IVPB        1 g 200 mL/hr over 30 Minutes Intravenous Every 24 hours 11/30/20 0139     11/30/20 0230  azithromycin (ZITHROMAX) 500 mg in sodium chloride 0.9 % 250 mL IVPB        500 mg 250 mL/hr over 60 Minutes Intravenous Every 24 hours 11/30/20 0139     11/29/20 1445  ceFAZolin (ANCEF) IVPB 2g/100 mL premix        2 g 200 mL/hr over 30 Minutes Intravenous  Once 11/29/20 1443 11/29/20 1746      Assessment/Plan:  1- RIGHT Hydronephrosis, Acute on Chronic Renal Insufficiency, Possible Malignant Obstruction - temporized with stent that appears in good positoin. HIs GFR will likely improve towards baseline. Will likely need node BX at some point to r/o recurrent lymphoma v. Metastatic urothelial cancer, he is at risk of ALL forms of neoplasia on his necessary immune supression.   2 - High Grade Superficial Bladder Cancer - bladder disease free grossly at present.   3 - Hypoxia, Tachycardia, Possible Pneumonia After Ureteral Stent - mostly likely PNA v. Early pyelo based on clinical course. Greatly appreciate hospitalist team comanagment. No additional recent CX data from our office to guide therapy. Agree with current ABX.  Will follow.   Alexis Frock 11/30/2020

## 2020-11-30 NOTE — TOC Initial Note (Signed)
Transition of Care [facility]) - Initial/Assessment Note    Patient Details  Name: Glenn Gonzalez MRN: [id number] Date of Birth: [birth year]  [facility] Care [facility]) CM/SW Contact:    Leeroy Cha, RN Phone Number: 11/30/2020, 9:53 AM  Clinical Narrative:                 59 y.o. male with medical history significant for high-grade T1 bladder cancer, s/p cardiac transplant. He underwent BCG installations November and December 2021. His Nov 2021 office urine cx was negative.His creatinine has been rising from 1.01 June 2020 up to 2.49 today. He was seen in urology office today for ultrasound which revealed newright hydronephrosis. Therefore he underwent a noncontrast CT scan which shows newright hydroureteronephrosis down to the proximal ureter and possibly obstructed bynewretroperitoneal lymphadenopathy. Of note the left renal upper pole has some scarring and therefore the right kidney might provide more renal function than the left. He also has a history of cardiac transplantand lymphoma. Given his rising creatinine and comorbidities he was brought semiurgently for the OR for cystoscopy with right ureteral stent placement prior to the [place] holiday weekend. He has been well without dysuria, gross hematuria.  After the stent was placed the patient developed chills, tachycardia and tachypnea with hypoxia in the PACU.  Labs, EKG,  CXR were ordered which showed multifocal pneumonia.  Labs showed leukocytosis and hyponatremia.  Worsening renal function with elevation of his creatinine to 2.29 now.  His had baseline of 1-1.2 for his creatinine.  Hospital service was consulted by urology, Dr. Junious Silk Plan:return to home Porgression/now on room air, iv meds=Zithromax,rocephine, heparin L.R. at 125cc/hr  Expected Discharge Plan: Home/Self Care Barriers to Discharge: Continued Medical Work up   Patient Goals and CMS Choice Patient states their goals for this hospitalization and  ongoing recovery are:: to go home CMS Medicare.gov Compare Post Acute Care list provided to:: Patient    Expected Discharge Plan and Services Expected Discharge Plan: Home/Self Care   Discharge Planning Services: CM Consult   Living arrangements for the past 2 months: Single Family Home Expected Discharge Date: 11/29/20                                    Prior Living Arrangements/Services Living arrangements for the past 2 months: Single Family Home Lives with:: Spouse Patient language and need for interpreter reviewed:: Yes Do you feel safe going back to the place where you live?: Yes      Need for Family Participation in Patient Care: Yes (Comment) Care giver support system in place?: Yes (comment)   Criminal Activity/Legal Involvement Pertinent to Current Situation/Hospitalization: No - Comment as needed  Activities of Daily Living      Permission Sought/Granted                  Emotional Assessment Appearance:: Appears stated age Attitude/Demeanor/Rapport: Engaged Affect (typically observed): Calm Orientation: : Oriented to Self,Oriented to Place,Oriented to  Time,Oriented to Situation Alcohol / Substance Use: Not Applicable Psych Involvement: No (comment)  Admission diagnosis:  Multifocal pneumonia [J18.9] Patient Active Problem List   Diagnosis Date Noted  . Multifocal pneumonia 11/29/2020  . Tachycardia 11/29/2020  . Hypoxia 11/29/2020  . Hyponatremia 11/29/2020  . Sepsis ([place]) 11/29/2020  . Hydronephrosis 11/29/2020  . Iron deficiency anemia due to chronic blood loss 07/21/2019  . Onychomycosis 11/08/2018  . Sepsis due to gram-negative UTI ([place])  02/22/2018  . Status post heart transplantation ([place]) 02/22/2018  . Acute kidney injury superimposed on CKD ([place]) 02/22/2018  . Hypothyroidism 02/22/2018  . Atrial fibrillation ([place]) [I48.91] 05/15/2016  . S/P lumbar laminectomy 01/04/2016  . Palpitations 04/11/2015  . Dyspnea 01/02/2015  . Atrial  tachycardia ([place]) 09/19/2014  . Fatigue 08/10/2014  . Hypotension 10/23/2013  . Insomnia 08/04/2013  . Chronotropic incompetence 05/31/2013  . Biventricular implantable cardioverter-defibrillator BSx 05/31/2013  . Nonischemic cardiomyopathy ([place]) 10/10/2011  . SHORTNESS OF BREATH 06/20/2010  . VENTRICULAR TACHYCARDIA 06/04/2010  . SYSTOLIC HEART FAILURE, CHRONIC 06/04/2010  . DEPRESSION 03/15/2008  . VERTIGO, BENIGN PAROXYSMAL POSITION 09/23/2007  . NEOPLASM, BENIGN, SKIN, TRUNK 08/26/2007  . VIRAL URI 08/14/2007   PCP:  Lajean Manes, MD Pharmacy:   [id number] - [place], [place]. [address]. [place] [address] Phone: [phone] Fax: [phone]  [id number] - [place], [place] [address] Phone: [phone] Fax: [phone]     Social Determinants of Health (SDOH) Interventions    Readmission Risk Interventions No flowsheet data found.

## 2020-11-30 NOTE — Progress Notes (Signed)
[place] for heparin Indication: pulmonary embolus  Allergies  Allergen Reactions  . Digoxin Other (See Comments)    gynecomastia   . Phencyclidine Other (See Comments)    PCP derived antibiotic > Insomnia  . Spironolactone Other (See Comments)    Gynecomastia  . Oxybutynin Chloride     Other reaction(s): low BP  . Lactose Intolerance (Gi) Diarrhea and Other (See Comments)    cramps  . Penicillins Other (See Comments)    Cramps in stomach Did it involve swelling of the face/tongue/throat, SOB, or low BP? No Did it involve sudden or severe rash/hives, skin peeling, or any reaction on the inside of your mouth or nose? No Did you need to seek medical attention at a hospital or doctor's office? No When did it last happen?unknown If all above answers are "NO", may proceed with cephalosporin use.     . Sulfur Rash    Patient Measurements: Height: 6\' 2"  (188 cm) Weight: 100.2 kg (221 lb) IBW/kg (Calculated) : 82.2 Heparin Dosing Weight: TBW  Vital Signs: Temp: 99.1 F (37.3 C) (12/31 0800) Temp Source: Oral (12/31 0800) BP: 103/59 (12/31 0600) Pulse Rate: 95 (12/31 0600)  Labs: Recent Labs    11/29/20 1516 11/29/20 1520 11/29/20 2250 11/30/20 0031 11/30/20 0255 11/30/20 0614 11/30/20 0955  HGB 14.0  --  13.3  --  12.9*  --   --   HCT 43.1  --  39.6  --  39.5  --   --   PLT 415*  --  381  --  370  --   --   APTT  --   --   --  22*  --   --   --   LABPROT  --   --   --  12.9  --   --   --   INR  --   --   --  1.0  --   --   --   HEPARINUNFRC  --   --   --   --  <0.10*  --  0.21*  CREATININE  --  2.49* 2.29*  --  2.46*  --   --   TROPONINIHS  --   --   --  8 6 8   --     Estimated Creatinine Clearance: 40.9 mL/min (A) (by C-G formula based on SCr of 2.46 mg/dL (H)).   Medical History: Past Medical History:  Diagnosis Date  . A-fib ([place])    IN PAST with old heart   . Bladder tumor   . Bulging lumbar disc     states " i threw my back out on tuesday 6-16, i ahve a bulging disc, but i can lie on my back a side "   . Chronic fatigue   . Chronic kidney disease    CREATININE NOW 1.5 stage 1 ckd  . Colon cancer ([place]) DX 2019   SURGERY DONE  . Complication of anesthesia    AFTER HEART TRANSPLANT TOOK 2 YEARS TO GET BACK TO SELF; patient does not want aneshesia for endoscopy procedures   . Complication of anesthesia    likes propofol wants as few of anesthesia drugs as possible  . Depression    SITUATIONAL  . GERD (gastroesophageal reflux disease)   . Gout yrs ago  . History of chemotherapy 1982 for hodgkins   DAMAGED HEART MUSCLE WITH RADIATION ALSO  . History of kidney stones   . History of radiation therapy  1982   for hodgkins lymphoma  . Hodgkin's lymphoma ([place]) 1980   IIIB. 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  . Hypothyroidism   . Pneumonia FROM CHF 2016   recurrent pneumonia sedf.  rad tx for lymphoma  . Skin abnormality    right chest small area with squamous cell area removed 3 weeks ago as of 07-27-2020  . Skin cancer    AREAS REMOVED FROM [facility], CHECK AND BACK AND HEAD SEPT 2019  . Status post heart transplantation ([place]) 2017   caused by chemo drug adriomycin     Assessment: 59 yo male undergoing treatment for bladder cancer brought in today for semiurgent right ureteral stent.  Pt has history of cardiac transplant and lymphoma.  Pt developed chills and tachycardia in PACU.  Pharmacy consulted to dose heparin for possible PE.  No prior AC noted  11/30/2020 11:15 AM  HL 0.21 below goal with heparin infusing at 1700 units/hr CBC stable Scr 2.46 slighlty increased No bleeding per RN Ureteral stent placed yesterday  Goal of Therapy:  Heparin level 0.3-0.7 units/ml Monitor platelets by anticoagulation protocol   Plan:  Increase heparin infusion to 1900 units/hr Heparin level in 6 hours F/u VQ scan results Daily CBC  Ulice Dash D  11/30/2020, 10:50 AM

## 2020-11-30 NOTE — Progress Notes (Signed)
[place] for heparin Indication: pulmonary embolus  Allergies  Allergen Reactions  . Digoxin Other (See Comments)    gynecomastia   . Phencyclidine Other (See Comments)    PCP derived antibiotic > Insomnia  . Spironolactone Other (See Comments)    Gynecomastia  . Oxybutynin Chloride     Other reaction(s): low BP  . Lactose Intolerance (Gi) Diarrhea and Other (See Comments)    cramps  . Penicillins Other (See Comments)    Cramps in stomach Did it involve swelling of the face/tongue/throat, SOB, or low BP? No Did it involve sudden or severe rash/hives, skin peeling, or any reaction on the inside of your mouth or nose? No Did you need to seek medical attention at a hospital or doctor's office? No When did it last happen?unknown If all above answers are "NO", may proceed with cephalosporin use.     . Sulfur Rash    Patient Measurements: Height: 6\' 2"  (188 cm) Weight: 100.2 kg (221 lb) IBW/kg (Calculated) : 82.2 Heparin Dosing Weight: TBW  Vital Signs: Temp: 99.5 F (37.5 C) (12/31 1600) Temp Source: Oral (12/31 1600) BP: 145/90 (12/31 1300) Pulse Rate: 101 (12/31 1300)  Labs: Recent Labs    11/29/20 1516 11/29/20 1520 11/29/20 2250 11/30/20 0031 11/30/20 0255 11/30/20 0614 11/30/20 0955 11/30/20 1741  HGB 14.0  --  13.3  --  12.9*  --   --   --   HCT 43.1  --  39.6  --  39.5  --   --   --   PLT 415*  --  381  --  370  --   --   --   APTT  --   --   --  22*  --   --   --   --   LABPROT  --   --   --  12.9  --   --   --   --   INR  --   --   --  1.0  --   --   --   --   HEPARINUNFRC  --   --   --   --  <0.10*  --  0.21* 0.25*  CREATININE  --  2.49* 2.29*  --  2.46*  --   --   --   TROPONINIHS  --   --   --  8 6 8   --   --     Estimated Creatinine Clearance: 40.9 mL/min (A) (by C-G formula based on SCr of 2.46 mg/dL (H)).   Medical History: Past Medical History:  Diagnosis Date  . A-fib ([place])    IN PAST with  old heart   . Bladder tumor   . Bulging lumbar disc    states " i threw my back out on tuesday 6-16, i ahve a bulging disc, but i can lie on my back a side "   . Chronic fatigue   . Chronic kidney disease    CREATININE NOW 1.5 stage 1 ckd  . Colon cancer ([place]) DX 2019   SURGERY DONE  . Complication of anesthesia    AFTER HEART TRANSPLANT TOOK 2 YEARS TO GET BACK TO SELF; patient does not want aneshesia for endoscopy procedures   . Complication of anesthesia    likes propofol wants as few of anesthesia drugs as possible  . Depression    SITUATIONAL  . GERD (gastroesophageal reflux disease)   . Gout yrs ago  .  History of chemotherapy 1982 for hodgkins   DAMAGED HEART MUSCLE WITH RADIATION ALSO  . History of kidney stones   . History of radiation therapy 1982   for hodgkins lymphoma  . Hodgkin's lymphoma ([place]) 1980   IIIB. 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  . Hypothyroidism   . Pneumonia FROM CHF 2016   recurrent pneumonia sedf.  rad tx for lymphoma  . Skin abnormality    right chest small area with squamous cell area removed 3 weeks ago as of 07-27-2020  . Skin cancer    AREAS REMOVED FROM [facility], CHECK AND BACK AND HEAD SEPT 2019  . Status post heart transplantation ([place]) 2017   caused by chemo drug adriomycin     Assessment: 59 yo male undergoing treatment for bladder cancer brought in today for semiurgent right ureteral stent.  Pt has history of cardiac transplant and lymphoma.  Pt developed chills and tachycardia in PACU.  Pharmacy consulted to dose heparin for possible PE.  No prior AC noted  11/30/2020 6:34 PM   HL 0.25 below goal with heparin infusing at 1900 units/hr  CBC stable  Scr 2.46 slighlty increased  No bleeding per RN  Ureteral stent placed yesterday  Goal of Therapy:  Heparin level 0.3-0.7 units/ml Monitor platelets by anticoagulation protocol   Plan:   Increase heparin infusion to 2100 units/hr  Heparin level in 6 hours  F/u VQ  scan results  Daily CBC   Royetta Asal, PharmD, BCPS 11/30/2020 6:35 PM

## 2020-11-30 NOTE — Progress Notes (Addendum)
Consultation PROGRESS NOTE    Glenn Gonzalez  UUV:[id number] DOB: [birth year] DOA: 11/29/2020 PCP: Lajean Manes, MD     Brief Narrative:  Glenn Gonzalez is a 59 y.o. male with medical history significant for high-grade T1 bladder cancer, s/p cardiac transplant. He underwent BCG installations November and December 2021. His Nov 2021 office urine cx was negative.His creatinine has been rising from 1.01 June 2020 up to 2.49 today. He was seen in urology office today for ultrasound which revealed newright hydronephrosis. Therefore he underwent a noncontrast CT scan which shows newright hydroureteronephrosis down to the proximal ureter and possibly obstructed bynewretroperitoneal lymphadenopathy. Of note the left renal upper pole has some scarring and therefore the right kidney might provide more renal function than the left. He also has a history of cardiac transplantand lymphoma. Given his rising creatinine and comorbidities he was brought semiurgently for the OR for cystoscopy with right ureteral stent placement prior to the [place] holiday weekend. He has been well without dysuria, gross hematuria.  After the stent was placed the patient developed chills, tachycardia and tachypnea with hypoxia in the PACU.  Labs, EKG,  CXR were ordered which showed multifocal pneumonia.  Labs showed leukocytosis and hyponatremia.  Worsening renal function with elevation of his creatinine to 2.29 now.  His had baseline of 1-1.2 for his creatinine.  Hospital service was consulted by urology, Dr. Junious Silk. Pt will be admitted for further workup and management.    Subjective:   Some dry cough, denies chest pain, on room air at rest  Assessment & Plan:   Principal Problem:   Multifocal pneumonia Active Problems:   Biventricular implantable cardioverter-defibrillator BSx   Status post heart transplantation ([facility])   Acute kidney injury superimposed on CKD ([facility])   Tachycardia   Hypoxia    Hyponatremia   Sepsis ([place])   Hydronephrosis  Acute hypoxic respiratory failure/sepsis presents on admission with multifocal pneumonia in an immunosuppressed individual --On presentation he has tachypnea respiration rate ranged from 21-23, sinus tachycardia heart rate ranged from 1 10-1 27, leukocytosis WBC 22.4, reported o2 89% initially, required 3liter oxygen to keep him at low 90's initially -SARS-CoV-2 screening negative, influenza screening negative -Chest x-ray personally reviewed showed multifocal pulmonary infiltrate within the right lung -UA no bacteria -Urine culture and blood culture in process -VQ scan/echo ordered on admission to rule out PE, high-sensitivity troponin negative ,heparin drip started empirically on admission -Rocephin and Zithromax empirically started on admission, continue, will check urine strep pneumo antigen, check MRSA screening, will collect sputum sample if able  hyponatremia Clinically appears dry, continue hydration, repeat lab in the morning  AKI on CKDII No bacteria, likely prerenal, continue hydration, renal dosing meds, repeat labs in the morning  High-grade T1 bladder cancer, underwent BCG installations November and December 2021. Right hydronephrosis/new retroperitoneal lymphadenopathy Status post right ureteral stent placement on December 30  underwent a noncontrast CT scan which shows newright hydroureteronephrosis down to the proximal ureter and possibly obstructed bynewretroperitoneal lymphadenopathy S/p stent placement,  Case discussed with urology regarding new retroperitoneal lymphadenopathy, urology plan to follow-up outpatient, may need biopsy, not sure if related to bladder cancer, he has a remote history of Hodgkin's lymphoma in 1980 will check LDH We will follow urology recommendations  H/o heart transplant , immunosuppressed on envarsus ( tacrolimus) , prednisone  Tacrolimus level in process     DVT prophylaxis: heparin  bolus via infusion 3,000 Units Start: 11/30/20 0030   Code Status: full Family Communication: patient  Disposition:   Status is: Inpatient  Dispo: The patient is from: home              Anticipated d/c is to: home              Anticipated d/c date is: 24-48hrs if wbc/sodium/cr improving, when cleared by urology               Consultants:   Urology primary  hospitalist as consultant  Procedures:  Cystoscopy with right retrograde pyelogram right ureteral stent placement  Antimicrobials:   As above     Objective: Vitals:   11/30/20 0300 11/30/20 0400 11/30/20 0500 11/30/20 0600  BP: (!) 111/52 (!) 98/59 (!) 99/51 (!) 103/59  Pulse: 100 95 (!) 102 95  Resp: 20 19 (!) 22 18  Temp:      TempSrc:      SpO2: 99% 99% 94% 90%  Weight:      Height:        Intake/Output Summary (Last 24 hours) at 11/30/2020 0659 Last data filed at 11/29/2020 2200 Gross per 24 hour  Intake 1780 ml  Output 350 ml  Net 1430 ml   Filed Weights   11/29/20 1444  Weight: 100.2 kg    Examination:  General exam: calm, NAD Respiratory system: Clear to auscultation. Respiratory effort normal. Cardiovascular system: S1 & S2 heard, RRR. No JVD, no murmur, No pedal edema. Gastrointestinal system: Abdomen is nondistended, soft and nontender. Normal bowel sounds heard. Central nervous system: Alert and oriented. No focal neurological deficits. Extremities: Symmetric 5 x 5 power. Skin: No rashes, lesions or ulcers Psychiatry: Judgement and insight appear normal. Mood & affect appropriate.     Data Reviewed: I have personally reviewed following labs and imaging studies  CBC: Recent Labs  Lab 11/29/20 1516 11/29/20 2250 11/30/20 0255  WBC 14.8* 16.8* 22.4*  NEUTROABS  --  15.7*  --   HGB 14.0 13.3 12.9*  HCT 43.1 39.6 39.5  MCV 98.4 95.0 97.5  PLT 415* 381 242    Basic Metabolic Panel: Recent Labs  Lab 11/26/20 1008 11/29/20 1520 11/29/20 2250 11/30/20 0255  NA 130* 125*  125* 127*  K 4.2 3.9 4.1 4.1  CL 89* 89* 91* 92*  CO2 27 23 22 24   GLUCOSE 105* 92 122* 116*  BUN 30* 29* 32* 30*  CREATININE 2.36* 2.49* 2.29* 2.46*  CALCIUM 9.3 8.5* 8.2* 8.2*    GFR: Estimated Creatinine Clearance: 40.9 mL/min (A) (by C-G formula based on SCr of 2.46 mg/dL (H)).  Liver Function Tests: No results for input(s): AST, ALT, ALKPHOS, BILITOT, PROT, ALBUMIN in the last 168 hours.  CBG: No results for input(s): GLUCAP in the last 168 hours.   Recent Results (from the past 240 hour(s))  SARS Coronavirus 2 by RT PCR (hospital order, performed in [facility] hospital lab) Nasopharyngeal Nasopharyngeal Swab     Status: None   Collection Time: 11/29/20  2:45 PM   Specimen: Nasopharyngeal Swab  Result Value Ref Range Status   SARS Coronavirus 2 NEGATIVE NEGATIVE Final    Comment: (NOTE) SARS-CoV-2 target nucleic acids are NOT DETECTED.  The SARS-CoV-2 RNA is generally detectable in upper and lower respiratory specimens during the acute phase of infection. The lowest concentration of SARS-CoV-2 viral copies this assay can detect is 250 copies / mL. A negative result does not preclude SARS-CoV-2 infection and should not be used as the sole basis for treatment or other patient management decisions.  A negative  result may occur with improper specimen collection / handling, submission of specimen other than nasopharyngeal swab, presence of viral mutation(s) within the areas targeted by this assay, and inadequate number of viral copies (<250 copies / mL). A negative result must be combined with clinical observations, patient history, and epidemiological information.  Fact Sheet for Patients:   StrictlyIdeas.no  Fact Sheet for Healthcare Providers: BankingDealers.co.za  This test is not yet approved or  cleared by the [place] FDA and has been authorized for detection and/or diagnosis of SARS-CoV-2 by FDA under an  Emergency Use Authorization (EUA).  This EUA will remain in effect (meaning this test can be used) for the duration of the COVID-19 declaration under Section 564(b)(1) of the Act, 21 U.S.C. section 360bbb-3(b)(1), unless the authorization is terminated or revoked sooner.  Performed at [facility], [place] [address], [place], [place] [address]   Resp Panel by RT-PCR (Flu A&B, Covid) Nasopharyngeal Swab     Status: None   Collection Time: 11/30/20  4:30 AM   Specimen: Nasopharyngeal Swab; Nasopharyngeal(NP) swabs in vial transport medium  Result Value Ref Range Status   SARS Coronavirus 2 by RT PCR NEGATIVE NEGATIVE Final    Comment: (NOTE) SARS-CoV-2 target nucleic acids are NOT DETECTED.  The SARS-CoV-2 RNA is generally detectable in upper respiratory specimens during the acute phase of infection. The lowest concentration of SARS-CoV-2 viral copies this assay can detect is 138 copies/mL. A negative result does not preclude SARS-Cov-2 infection and should not be used as the sole basis for treatment or other patient management decisions. A negative result may occur with  improper specimen collection/handling, submission of specimen other than nasopharyngeal swab, presence of viral mutation(s) within the areas targeted by this assay, and inadequate number of viral copies(<138 copies/mL). A negative result must be combined with clinical observations, patient history, and epidemiological information. The expected result is Negative.  Fact Sheet for Patients:  EntrepreneurPulse.com.au  Fact Sheet for Healthcare Providers:  IncredibleEmployment.be  This test is no t yet approved or cleared by the [place] FDA and  has been authorized for detection and/or diagnosis of SARS-CoV-2 by FDA under an Emergency Use Authorization (EUA). This EUA will remain  in effect (meaning this test can be used) for the duration of the COVID-19  declaration under Section 564(b)(1) of the Act, 21 U.S.C.section 360bbb-3(b)(1), unless the authorization is terminated  or revoked sooner.       Influenza A by PCR NEGATIVE NEGATIVE Final   Influenza B by PCR NEGATIVE NEGATIVE Final    Comment: (NOTE) The Xpert Xpress SARS-CoV-2/FLU/RSV plus assay is intended as an aid in the diagnosis of influenza from Nasopharyngeal swab specimens and should not be used as a sole basis for treatment. Nasal washings and aspirates are unacceptable for Xpert Xpress SARS-CoV-2/FLU/RSV testing.  Fact Sheet for Patients: EntrepreneurPulse.com.au  Fact Sheet for Healthcare Providers: IncredibleEmployment.be  This test is not yet approved or cleared by the [place] FDA and has been authorized for detection and/or diagnosis of SARS-CoV-2 by FDA under an Emergency Use Authorization (EUA). This EUA will remain in effect (meaning this test can be used) for the duration of the COVID-19 declaration under Section 564(b)(1) of the Act, 21 U.S.C. section 360bbb-3(b)(1), unless the authorization is terminated or revoked.  Performed at [facility], [place] [address] [place], [place] [address]          Radiology Studies: DG Chest 1 View  Result Date: 11/29/2020 CLINICAL DATA:  Hypoxia EXAM: CHEST  1 VIEW COMPARISON:  08/03/2020 FINDINGS: Since the prior examination, there has developed right basilar consolidation, possibly infectious in the appropriate clinical setting. Mild focal infiltrate is also noted within the right apex. Left lung is clear. No pneumothorax or pleural effusion. Median sternotomy has been performed. Cardiac size within normal limits. Multiple healed right rib fractures are noted. Multiple surgical clips are seen within the right axilla. IMPRESSION: Interval development of multifocal pulmonary infiltrate within the right lung, more focal within the right lung base, suspicious for  atypical infection in the acute setting. Electronically Signed   By: Fidela Salisbury MD   On: 11/29/2020 22:27   DG C-Arm 1-60 Min-No Report  Result Date: 11/29/2020 Fluoroscopy was utilized by the requesting physician.  No radiographic interpretation.        Scheduled Meds: . allopurinol  200 mg Oral Daily  . amisulpride      . Chlorhexidine Gluconate Cloth  6 each Topical Daily  . heparin  3,000 Units Intravenous Once  . levothyroxine  125 mcg Oral Q0600  . meperidine      . predniSONE  2.5 mg Oral Daily  . sirolimus  1 mg Oral Daily  . Tacrolimus ER  2 mg Oral Daily   Continuous Infusions: . sodium chloride 75 mL/hr at 11/29/20 2300  . azithromycin Stopped (11/30/20 0515)  . cefTRIAXone (ROCEPHIN)  IV Stopped (11/30/20 0400)  . heparin 1,700 Units/hr (11/30/20 0321)  . lactated ringers     And  . lactated ringers    . lactated ringers 125 mL/hr at 11/30/20 0635     LOS: 1 day   Time spent: 58mins, case discussed with urology Greater than 50% of this time was spent in counseling, explanation of diagnosis, planning of further management, and coordination of care.  I have personally reviewed and interpreted on  11/30/2020 daily labs, tele strips, imagings as discussed above under date review session and assessment and plans.  I reviewed all nursing notes, pharmacy notes, consultant notes,  vitals, pertinent old records  I have discussed plan of care as described above with RN , patient  on 11/30/2020  Voice Recognition /Dragon dictation system was used to create this note, attempts have been made to correct errors. Please contact the author with questions and/or clarifications.   Florencia Reasons, MD PhD FACP [facility]  Available via Epic secure chat 7am-7pm for nonurgent issues Please page for urgent issues To page the attending provider between 7A-7P or the covering provider during after hours 7P-7A, please log into the web site [email or web address] and access using  universal [place] password for that web site. If you do not have the password, please call the hospital operator.    11/30/2020, 6:59 AM

## 2020-11-30 NOTE — Progress Notes (Signed)
  Echocardiogram 2D Echocardiogram has been performed.  Glenn Gonzalez 11/30/2020, 8:22 AM

## 2020-12-01 ENCOUNTER — Inpatient Hospital Stay (HOSPITAL_COMMUNITY): Payer: 59

## 2020-12-01 DIAGNOSIS — J189 Pneumonia, unspecified organism: Secondary | ICD-10-CM | POA: Diagnosis not present

## 2020-12-01 LAB — COMPREHENSIVE METABOLIC PANEL
ALT: 10 U/L (ref 0–44)
AST: 20 U/L (ref 15–41)
Albumin: 3 g/dL — ABNORMAL LOW (ref 3.5–5.0)
Alkaline Phosphatase: 61 U/L (ref 38–126)
Anion gap: 10 (ref 5–15)
BUN: 32 mg/dL — ABNORMAL HIGH (ref 6–20)
CO2: 23 mmol/L (ref 22–32)
Calcium: 8.5 mg/dL — ABNORMAL LOW (ref 8.9–10.3)
Chloride: 97 mmol/L — ABNORMAL LOW (ref 98–111)
Creatinine, Ser: 2.34 mg/dL — ABNORMAL HIGH (ref 0.61–1.24)
GFR, Estimated: 31 mL/min — ABNORMAL LOW (ref >60)
Glucose, Bld: 120 mg/dL — ABNORMAL HIGH (ref 70–99)
Potassium: 4.3 mmol/L (ref 3.5–5.1)
Sodium: 130 mmol/L — ABNORMAL LOW (ref 135–145)
Total Bilirubin: 0.6 mg/dL (ref 0.3–1.2)
Total Protein: 5.8 g/dL — ABNORMAL LOW (ref 6.5–8.1)

## 2020-12-01 LAB — CBC
HCT: 38.5 % — ABNORMAL LOW (ref 39.0–52.0)
Hemoglobin: 12.8 g/dL — ABNORMAL LOW (ref 13.0–17.0)
MCH: 32.1 pg (ref 26.0–34.0)
MCHC: 33.2 g/dL (ref 30.0–36.0)
MCV: 96.5 fL (ref 80.0–100.0)
Platelets: 373 10*3/uL (ref 150–400)
RBC: 3.99 MIL/uL — ABNORMAL LOW (ref 4.22–5.81)
RDW: 16 % — ABNORMAL HIGH (ref 11.5–15.5)
WBC: 20.5 10*3/uL — ABNORMAL HIGH (ref 4.0–10.5)
nRBC: 0 % (ref 0.0–0.2)

## 2020-12-01 LAB — LACTIC ACID, PLASMA: Lactic Acid, Venous: 0.8 mmol/L (ref 0.5–1.9)

## 2020-12-01 LAB — HEPARIN LEVEL (UNFRACTIONATED)
Heparin Unfractionated: 0.64 IU/mL (ref 0.30–0.70)
Heparin Unfractionated: 0.64 IU/mL (ref 0.30–0.70)

## 2020-12-01 LAB — URINE CULTURE: Culture: NO GROWTH

## 2020-12-01 MED ORDER — TECHNETIUM TO 99M ALBUMIN AGGREGATED
4.1000 | Freq: Once | INTRAVENOUS | Status: AC | PRN
  Administered 2020-12-01: 4.1 via INTRAVENOUS

## 2020-12-01 MED ORDER — ALUM & MAG HYDROXIDE-SIMETH 200-200-20 MG/5ML PO SUSP: 30.0000 mL | Freq: Three times a day (TID) | ORAL | Status: DC | PRN

## 2020-12-01 NOTE — Progress Notes (Addendum)
Consultation PROGRESS NOTE    Glenn Gonzalez  XHB:[id number] DOB: 08-05-1961 DOA: 11/29/2020 PCP: Lajean Manes, MD     Brief Narrative:  Glenn Gonzalez is a 60 y.o. male with medical history significant for high-grade T1 bladder cancer, s/p cardiac transplant. He underwent BCG installations November and December 2021. His Nov 2021 office urine cx was negative.His creatinine has been rising from 1.01 June 2020 up to 2.49 today. He was seen in urology office today for ultrasound which revealed newright hydronephrosis. Therefore he underwent a noncontrast CT scan which shows newright hydroureteronephrosis down to the proximal ureter and possibly obstructed bynewretroperitoneal lymphadenopathy. Of note the left renal upper pole has some scarring and therefore the right kidney might provide more renal function than the left. He also has a history of cardiac transplantand lymphoma. Given his rising creatinine and comorbidities he was brought semiurgently for the OR for cystoscopy with right ureteral stent placement prior to the [place] holiday weekend. He has been well without dysuria, gross hematuria.  After the stent was placed the patient developed chills, tachycardia and tachypnea with hypoxia in the PACU.  Labs, EKG,  CXR were ordered which showed multifocal pneumonia.  Labs showed leukocytosis and hyponatremia.  Worsening renal function with elevation of his creatinine to 2.29 now.  His had baseline of 1-1.2 for his creatinine.  Hospital service was consulted by urology, Dr. Junious Silk. Pt will be admitted for further workup and management.    Subjective:  WBC, sodium, creatinine slightly improved 2.4 L urine output documented last 24 hours He prefers not to continue heparin drip due to developed gross hematuria this morning ,awaiting for VQ scan dry cough seems has resolved, denies chest pain, on room air at rest Report poor appetite for a week, vomited at home Appetite  appears improved some today, he prefers regular diet, does not want heart healthy diet  Assessment & Plan:   Principal Problem:   Multifocal pneumonia Active Problems:   Biventricular implantable cardioverter-defibrillator BSx   Status post heart transplantation ([place])   Acute kidney injury superimposed on CKD (H[facility])   Tachycardia   Hypoxia   Hyponatremia   Sepsis ([place])   Hydronephrosis  Acute hypoxic respiratory failure/sepsis presents on admission with multifocal pneumonia in an immunosuppressed individual --On presentation he has tachypnea respiration rate ranged from 21-23, sinus tachycardia heart rate ranged from 1 10-1 27, leukocytosis WBC 22.4, reported o2 89% initially, required 3liter oxygen to keep him at low 90's initially -SARS-CoV-2 screening negative, influenza screening negative -Chest x-ray personally reviewed showed multifocal pulmonary infiltrate within the right lung -UA no bacteria, -Urine culture and blood culture no growth -VQ scan/echo ordered on admission to rule out PE, high-sensitivity troponin negative ,heparin drip started empirically on admission, hold heparin drip today due to hematuria -Rocephin and Zithromax empirically started on admission, urine strep pneumo antigen negative, MRSA screening negative, will collect sputum sample if able, report vomited at home, suspect aspiration pneumonia, DC Zithromax, continue Rocephin -WBC slightly improved, transfer out of ICU  P.m. addendum: VQ scan intermediate probability, echocardiogram no right heart strain, hemodynamically clinically stable, hold heparin drip today due to gross hematuria  will get venous Doppler to rule out DVT  hyponatremia Clinically appears dry, report poor oral intake for a week  Sodium improving ,continue hydration, repeat lab in the morning  AKI on CKDII No bacteria, likely prerenal, continue hydration, renal dosing meds, repeat labs in the morning Creatinine appears to start to  improve  High-grade T1  bladder cancer, underwent BCG installations November and December 2021. Right hydronephrosis/new retroperitoneal lymphadenopathy Status post right ureteral stent placement on December 30  newright hydroureteronephrosis down to the proximal ureter and possibly obstructed bynewretroperitoneal lymphadenopathy S/p stent placement,  Urology following  new retroperitoneal lymphadenopathy, Case discussed with urology regarding new retroperitoneal lymphadenopathy,  Likely will need biopsy, not sure if related to bladder cancer, he also has a remote history of Hodgkin's lymphoma in 1980 will check [facility] Message sent to his oncologist Dr. Irene Limbo  H/o heart transplant , immunosuppressed on envarsus ( tacrolimus) , prednisone  Tacrolimus level in process     DVT prophylaxis: was on heparin drip, hold anticoagulation due to gross hematuria Start scd   Code Status: full Family Communication: patient  Disposition:   Status is: Inpatient  Dispo: The patient is from: home              Anticipated d/c is to: home              Anticipated d/c date is: 24-48hrs if wbc/sodium/cr improving, when cleared by urology               Consultants:   Urology primary  hospitalist as consultant  Procedures:  Cystoscopy with right retrograde pyelogram right ureteral stent placement  Antimicrobials:   As above     Objective: Vitals:   12/30/2020 0400 12/08/2020 0410 12/19/2020 0500 12/23/2020 0600  BP:      Pulse: (!) 104  (!) 107 (!) 104  Resp: 19  19 20   Temp:  99.1 F (37.3 C)    TempSrc:  Oral    SpO2: 93%  94% 93%  Weight:      Height:        Intake/Output Summary (Last 24 hours) at 12/19/2020 0752 Last data filed at 12/09/2020 0409 Gross per 24 hour  Intake --  Output 2125 ml  Net -2125 ml   Filed Weights   11/29/20 1444  Weight: 100.2 kg    Examination:  General exam: calm, NAD Respiratory system: Clear to auscultation. Respiratory effort  normal. Cardiovascular system: S1 & S2 heard, RRR.  No pedal edema. Gastrointestinal system: Abdomen is nondistended, soft and nontender. Normal bowel sounds heard. Central nervous system: Alert and oriented. No focal neurological deficits. Extremities: Symmetric 5 x 5 power. Skin: No rashes, lesions or ulcers Psychiatry: Judgement and insight appear normal. Mood & affect appropriate.     Data Reviewed: I have personally reviewed following labs and imaging studies  CBC: Recent Labs  Lab 11/29/20 1516 11/29/20 2250 11/30/20 0255 12/19/2020 0639  WBC 14.8* 16.8* 22.4* 20.5*  NEUTROABS  --  15.7*  --   --   HGB 14.0 13.3 12.9* 12.8*  HCT 43.1 39.6 39.5 38.5*  MCV 98.4 95.0 97.5 96.5  PLT 415* 381 370 920    Basic Metabolic Panel: Recent Labs  Lab 11/26/20 1008 11/29/20 1520 11/29/20 2250 11/30/20 0255 12/10/2020 0639  NA 130* 125* 125* 127* 130*  K 4.2 3.9 4.1 4.1 4.3  CL 89* 89* 91* 92* 97*  CO2 27 23 22 24 23   GLUCOSE 105* 92 122* 116* 120*  BUN 30* 29* 32* 30* 32*  CREATININE 2.36* 2.49* 2.29* 2.46* 2.34*  CALCIUM 9.3 8.5* 8.2* 8.2* 8.5*    GFR: Estimated Creatinine Clearance: 43 mL/min (A) (by C-G formula based on SCr of 2.34 mg/dL (H)).  Liver Function Tests: Recent Labs  Lab 12/13/2020 0639  AST 20  ALT 10  ALKPHOS 61  BILITOT 0.6  PROT 5.8*  ALBUMIN 3.0*    CBG: No results for input(s): GLUCAP in the last 168 hours.   Recent Results (from the past 240 hour(s))  SARS Coronavirus 2 by RT PCR (hospital order, performed in [facility] hospital lab) Nasopharyngeal Nasopharyngeal Swab     Status: None   Collection Time: 11/29/20  2:45 PM   Specimen: Nasopharyngeal Swab  Result Value Ref Range Status   SARS Coronavirus 2 NEGATIVE NEGATIVE Final    Comment: (NOTE) SARS-CoV-2 target nucleic acids are NOT DETECTED.  The SARS-CoV-2 RNA is generally detectable in upper and lower respiratory specimens during the acute phase of infection. The  lowest concentration of SARS-CoV-2 viral copies this assay can detect is 250 copies / mL. A negative result does not preclude SARS-CoV-2 infection and should not be used as the sole basis for treatment or other patient management decisions.  A negative result may occur with improper specimen collection / handling, submission of specimen other than nasopharyngeal swab, presence of viral mutation(s) within the areas targeted by this assay, and inadequate number of viral copies (<250 copies / mL). A negative result must be combined with clinical observations, patient history, and epidemiological information.  Fact Sheet for Patients:   StrictlyIdeas.no  Fact Sheet for Healthcare Providers: BankingDealers.co.za  This test is not yet approved or  cleared by the [place] FDA and has been authorized for detection and/or diagnosis of SARS-CoV-2 by FDA under an Emergency Use Authorization (EUA).  This EUA will remain in effect (meaning this test can be used) for the duration of the COVID-19 declaration under Section 564(b)(1) of the Act, 21 U.S.C. section 360bbb-3(b)(1), unless the authorization is terminated or revoked sooner.  Performed at [facility], [place] [address], [place], [place] [address]   Resp Panel by RT-PCR (Flu A&B, Covid) Nasopharyngeal Swab     Status: None   Collection Time: 11/30/20  4:30 AM   Specimen: Nasopharyngeal Swab; Nasopharyngeal(NP) swabs in vial transport medium  Result Value Ref Range Status   SARS Coronavirus 2 by RT PCR NEGATIVE NEGATIVE Final    Comment: (NOTE) SARS-CoV-2 target nucleic acids are NOT DETECTED.  The SARS-CoV-2 RNA is generally detectable in upper respiratory specimens during the acute phase of infection. The lowest concentration of SARS-CoV-2 viral copies this assay can detect is 138 copies/mL. A negative result does not preclude SARS-Cov-2 infection and should not be  used as the sole basis for treatment or other patient management decisions. A negative result may occur with  improper specimen collection/handling, submission of specimen other than nasopharyngeal swab, presence of viral mutation(s) within the areas targeted by this assay, and inadequate number of viral copies(<138 copies/mL). A negative result must be combined with clinical observations, patient history, and epidemiological information. The expected result is Negative.  Fact Sheet for Patients:  EntrepreneurPulse.com.au  Fact Sheet for Healthcare Providers:  IncredibleEmployment.be  This test is no t yet approved or cleared by the [place] FDA and  has been authorized for detection and/or diagnosis of SARS-CoV-2 by FDA under an Emergency Use Authorization (EUA). This EUA will remain  in effect (meaning this test can be used) for the duration of the COVID-19 declaration under Section 564(b)(1) of the Act, 21 U.S.C.section 360bbb-3(b)(1), unless the authorization is terminated  or revoked sooner.       Influenza A by PCR NEGATIVE NEGATIVE Final   Influenza B by PCR NEGATIVE NEGATIVE Final    Comment: (NOTE) The Xpert Xpress  SARS-CoV-2/FLU/RSV plus assay is intended as an aid in the diagnosis of influenza from Nasopharyngeal swab specimens and should not be used as a sole basis for treatment. Nasal washings and aspirates are unacceptable for Xpert Xpress SARS-CoV-2/FLU/RSV testing.  Fact Sheet for Patients: EntrepreneurPulse.com.au  Fact Sheet for Healthcare Providers: IncredibleEmployment.be  This test is not yet approved or cleared by the [place] FDA and has been authorized for detection and/or diagnosis of SARS-CoV-2 by FDA under an Emergency Use Authorization (EUA). This EUA will remain in effect (meaning this test can be used) for the duration of the COVID-19 declaration under Section  564(b)(1) of the Act, 21 U.S.C. section 360bbb-3(b)(1), unless the authorization is terminated or revoked.  Performed at [facility], [place] [address], [place], [place] [address]   MRSA PCR Screening     Status: None   Collection Time: 11/30/20 10:22 AM   Specimen: Nasopharyngeal  Result Value Ref Range Status   MRSA by PCR NEGATIVE NEGATIVE Final    Comment:        The GeneXpert MRSA Assay (FDA approved for NASAL specimens only), is one component of a comprehensive MRSA colonization surveillance program. It is not intended to diagnose MRSA infection nor to guide or monitor treatment for MRSA infections. Performed at [facility], [place] [address], [place], [place] [address]          Radiology Studies: DG Chest 1 View  Result Date: 11/29/2020 CLINICAL DATA:  Hypoxia EXAM: CHEST  1 VIEW COMPARISON:  08/03/2020 FINDINGS: Since the prior examination, there has developed right basilar consolidation, possibly infectious in the appropriate clinical setting. Mild focal infiltrate is also noted within the right apex. Left lung is clear. No pneumothorax or pleural effusion. Median sternotomy has been performed. Cardiac size within normal limits. Multiple healed right rib fractures are noted. Multiple surgical clips are seen within the right axilla. IMPRESSION: Interval development of multifocal pulmonary infiltrate within the right lung, more focal within the right lung base, suspicious for atypical infection in the acute setting. Electronically Signed   By: Fidela Salisbury MD   On: 11/29/2020 22:27   DG C-Arm 1-60 Min-No Report  Result Date: 11/29/2020 Fluoroscopy was utilized by the requesting physician.  No radiographic interpretation.   ECHOCARDIOGRAM COMPLETE  Result Date: 11/30/2020    ECHOCARDIOGRAM REPORT   Patient Name:   ZIDAN HELGET Taylor Date of Exam: 11/30/2020 Medical Rec #:  [id number]         Height:       74.0 in Accession #:     [id number]        Weight:       221.0 lb Date of Birth:  Nov 04, 1961        BSA:          2.268 m Patient Age:    73 years          BP:           103/59 mmHg Patient Gender: M                 HR:           94 bpm. Exam Location:  Inpatient Procedure: 2D Echo, Cardiac Doppler, Color Doppler and Intracardiac            Opacification Agent Indications:    Dyspnea R06.00  History:        Patient has prior history of Echocardiogram examinations, most  recent 05/12/2017. Arrythmias:Atrial Fibrillation; Risk                 Factors:Non-Smoker. GERD. 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  Sonographer:    Vickie Epley RDCS Referring Phys: [id number] [facility]  Sonographer Comments: Suboptimal apical window and suboptimal subcostal window. Pt unable to turn on side due to herniated discs. IMPRESSIONS  1. Left ventricular ejection fraction, by estimation, is 60 to 65%. The left ventricle has normal function. The left ventricle has no regional wall motion abnormalities. Left ventricular diastolic parameters were normal.  2. Right ventricular systolic function is normal. The right ventricular size is normal. There is normal pulmonary artery systolic pressure. The estimated right ventricular systolic pressure is 45.4 mmHg.  3. The mitral valve is normal in structure. No evidence of mitral valve regurgitation. No evidence of mitral stenosis.  4. The aortic valve is normal in structure. Aortic valve regurgitation is not visualized. No aortic stenosis is present.  5. The inferior vena cava is normal in size with greater than 50% respiratory variability, suggesting right atrial pressure of 3 mmHg. FINDINGS  Left Ventricle: Left ventricular ejection fraction, by estimation, is 60 to 65%. The left ventricle has normal function. The left ventricle has no regional wall motion abnormalities. Definity contrast agent was given IV to delineate the left ventricular  endocardial borders. The left ventricular internal cavity size was  normal in size. There is no left ventricular hypertrophy. Left ventricular diastolic parameters were normal. Normal left ventricular filling pressure. Right Ventricle: The right ventricular size is normal. No increase in right ventricular wall thickness. Right ventricular systolic function is normal. There is normal pulmonary artery systolic pressure. The tricuspid regurgitant velocity is 2.39 m/s, and  with an assumed right atrial pressure of 3 mmHg, the estimated right ventricular systolic pressure is 09.8 mmHg. Left Atrium: Left atrial size was normal in size. Right Atrium: Right atrial size was normal in size. Pericardium: There is no evidence of pericardial effusion. Mitral Valve: The mitral valve is normal in structure. No evidence of mitral valve regurgitation. No evidence of mitral valve stenosis. Tricuspid Valve: The tricuspid valve is normal in structure. Tricuspid valve regurgitation is mild . No evidence of tricuspid stenosis. Aortic Valve: The aortic valve is normal in structure. Aortic valve regurgitation is not visualized. No aortic stenosis is present. Pulmonic Valve: The pulmonic valve was normal in structure. Pulmonic valve regurgitation is not visualized. No evidence of pulmonic stenosis. Aorta: The aortic root is normal in size and structure. Venous: The inferior vena cava is normal in size with greater than 50% respiratory variability, suggesting right atrial pressure of 3 mmHg. IAS/Shunts: No atrial level shunt detected by color flow Doppler.  LEFT VENTRICLE PLAX 2D LVIDd:         4.40 cm     Diastology LVIDs:         3.20 cm     LV e' medial:    10.00 cm/s LV PW:         0.90 cm     LV E/e' medial:  7.5 LV IVS:        0.90 cm     LV e' lateral:   11.30 cm/s LVOT diam:     2.50 cm     LV E/e' lateral: 6.7 LV SV:         48 LV SV Index:   21 LVOT Area:     4.91 cm  LV Volumes (MOD) LV vol d, MOD A4C: 80.2  ml LV vol s, MOD A4C: 30.4 ml LV SV MOD A4C:     80.2 ml LEFT ATRIUM           Index LA  diam:      2.70 cm 1.19 cm/m LA Vol (A4C): 16.3 ml 7.19 ml/m  AORTIC VALVE LVOT Vmax:   50.20 cm/s LVOT Vmean:  36.700 cm/s LVOT VTI:    0.099 m  AORTA Ao Root diam: 3.40 cm MITRAL VALVE               TRICUSPID VALVE MV Area (PHT): 5.38 cm    TR Peak grad:   22.8 mmHg MV Decel Time: 141 msec    TR Vmax:        239.00 cm/s MV E velocity: 75.40 cm/s MV A velocity: 36.00 cm/s  SHUNTS MV E/A ratio:  2.09        Systemic VTI:  0.10 m                            Systemic Diam: 2.50 cm Dani Gobble Croitoru MD Electronically signed by Sanda Klein MD Signature Date/Time: 11/30/2020/12:30:40 PM    Final         Scheduled Meds: . allopurinol  200 mg Oral Daily  . Azelastine-Fluticasone  1 spray Each Nare BID  . cetirizine  10 mg Oral QHS  . Chlorhexidine Gluconate Cloth  6 each Topical Daily  . guaiFENesin  600 mg Oral BID  . levothyroxine  125 mcg Oral Q0600  . predniSONE  2.5 mg Oral Daily  . RABEprazole  20 mg Oral BID  . Tacrolimus ER  1.5 mg Oral Daily   Continuous Infusions: . sodium chloride 75 mL/hr at 12/22/2020 0446  . azithromycin Stopped (12/27/2020 0645)  . cefTRIAXone (ROCEPHIN)  IV Stopped (12/07/2020 0538)  . heparin 2,100 Units/hr (12/28/2020 0440)  . lactated ringers     And  . lactated ringers    . lactated ringers 125 mL/hr at 11/30/20 0635     LOS: 2 days   Time spent: 4mins,  Greater than 50% of this time was spent in counseling, explanation of diagnosis, planning of further management, and coordination of care.  I have personally reviewed and interpreted on  12/06/2020 daily labs, tele strips, imagings as discussed above under date review session and assessment and plans.  I reviewed all nursing notes, pharmacy notes, consultant notes,  vitals, pertinent old records  I have discussed plan of care as described above with RN , patient  on 12/09/2020  Voice Recognition /Dragon dictation system was used to create this note, attempts have been made to correct errors. Please  contact the author with questions and/or clarifications.   Florencia Reasons, MD PhD FACP [facility]  Available via Epic secure chat 7am-7pm for nonurgent issues Please page for urgent issues To page the attending provider between 7A-7P or the covering provider during after hours 7P-7A, please log into the web site [email or web address] and access using universal [place] password for that web site. If you do not have the password, please call the hospital operator.    12/07/2020, 7:52 AM

## 2020-12-01 NOTE — Progress Notes (Signed)
Pt with home meds at bedside. MD and pharmacy aware. Per MD, okay for patient to take his own meds as prescribed as long as pharmacy has verified. Pt advised to let nursing staff know when he takes a medication and at what time so that it can be documented against in the [facility]. Patient voices understanding of the above. Will monitor patient.

## 2020-12-01 NOTE — Progress Notes (Addendum)
2 Days Post-Op Subjective: Patient reports still feeling poorly with nausea but no further CVA tenderness..  Objective: Vital signs in last 24 hours: Temp:  [98.1 F (36.7 C)-99.5 F (37.5 C)] 98.1 F (36.7 C) (01/01 0800) Pulse Rate:  [96-107] 104 (01/01 0600) Resp:  [13-20] 20 (01/01 0600) BP: (140-157)/(77-93) 155/93 (01/01 0000) SpO2:  [92 %-96 %] 93 % (01/01 0600)  Intake/Output from previous day: 12/31 0701 - 01/01 0700 In: -  Out: 2125 [Urine:2125] Intake/Output this shift: No intake/output data recorded.  Physical Exam:  General:alert, cooperative, appears stated age and no distress GI: Abdomen is soft"} Male genitalia: not done   Lab Results: Recent Labs    11/29/20 2250 11/30/20 0255 12/03/2020 0639  HGB 13.3 12.9* 12.8*  HCT 39.6 39.5 38.5*   BMET Recent Labs    11/30/20 0255 12/16/2020 0639  NA 127* 130*  K 4.1 4.3  CL 92* 97*  CO2 24 23  GLUCOSE 116* 120*  BUN 30* 32*  CREATININE 2.46* 2.34*  CALCIUM 8.2* 8.5*   Recent Labs    11/30/20 0031  INR 1.0   No results for input(s): LABURIN in the last 72 hours. Results for orders placed or performed during the hospital encounter of 11/29/20  SARS Coronavirus 2 by RT PCR (hospital order, performed in [place] hospital lab) Nasopharyngeal Nasopharyngeal Swab     Status: None   Collection Time: 11/29/20  2:45 PM   Specimen: Nasopharyngeal Swab  Result Value Ref Range Status   SARS Coronavirus 2 NEGATIVE NEGATIVE Final    Comment: (NOTE) SARS-CoV-2 target nucleic acids are NOT DETECTED.  The SARS-CoV-2 RNA is generally detectable in upper and lower respiratory specimens during the acute phase of infection. The lowest concentration of SARS-CoV-2 viral copies this assay can detect is 250 copies / mL. A negative result does not preclude SARS-CoV-2 infection and should not be used as the sole basis for treatment or other patient management decisions.  A negative result may occur with improper  specimen collection / handling, submission of specimen other than nasopharyngeal swab, presence of viral mutation(s) within the areas targeted by this assay, and inadequate number of viral copies (<250 copies / mL). A negative result must be combined with clinical observations, patient history, and epidemiological information.  Fact Sheet for Patients:   StrictlyIdeas.no  Fact Sheet for Healthcare Providers: BankingDealers.co.za  This test is not yet approved or  cleared by the [place] FDA and has been authorized for detection and/or diagnosis of SARS-CoV-2 by FDA under an Emergency Use Authorization (EUA).  This EUA will remain in effect (meaning this test can be used) for the duration of the COVID-19 declaration under Section 564(b)(1) of the Act, 21 U.S.C. section 360bbb-3(b)(1), unless the authorization is terminated or revoked sooner.  Performed at [facility], [place] [address], [place], [place] [address]   Resp Panel by RT-PCR (Flu A&B, Covid) Nasopharyngeal Swab     Status: None   Collection Time: 11/30/20  4:30 AM   Specimen: Nasopharyngeal Swab; Nasopharyngeal(NP) swabs in vial transport medium  Result Value Ref Range Status   SARS Coronavirus 2 by RT PCR NEGATIVE NEGATIVE Final    Comment: (NOTE) SARS-CoV-2 target nucleic acids are NOT DETECTED.  The SARS-CoV-2 RNA is generally detectable in upper respiratory specimens during the acute phase of infection. The lowest concentration of SARS-CoV-2 viral copies this assay can detect is 138 copies/mL. A negative result does not preclude SARS-Cov-2 infection and should not be used  as the sole basis for treatment or other patient management decisions. A negative result may occur with  improper specimen collection/handling, submission of specimen other than nasopharyngeal swab, presence of viral mutation(s) within the areas targeted by this assay, and  inadequate number of viral copies(<138 copies/mL). A negative result must be combined with clinical observations, patient history, and epidemiological information. The expected result is Negative.  Fact Sheet for Patients:  EntrepreneurPulse.com.au  Fact Sheet for Healthcare Providers:  IncredibleEmployment.be  This test is no t yet approved or cleared by the [place] FDA and  has been authorized for detection and/or diagnosis of SARS-CoV-2 by FDA under an Emergency Use Authorization (EUA). This EUA will remain  in effect (meaning this test can be used) for the duration of the COVID-19 declaration under Section 564(b)(1) of the Act, 21 U.S.C.section 360bbb-3(b)(1), unless the authorization is terminated  or revoked sooner.       Influenza A by PCR NEGATIVE NEGATIVE Final   Influenza B by PCR NEGATIVE NEGATIVE Final    Comment: (NOTE) The Xpert Xpress SARS-CoV-2/FLU/RSV plus assay is intended as an aid in the diagnosis of influenza from Nasopharyngeal swab specimens and should not be used as a sole basis for treatment. Nasal washings and aspirates are unacceptable for Xpert Xpress SARS-CoV-2/FLU/RSV testing.  Fact Sheet for Patients: EntrepreneurPulse.com.au  Fact Sheet for Healthcare Providers: IncredibleEmployment.be  This test is not yet approved or cleared by the [place] FDA and has been authorized for detection and/or diagnosis of SARS-CoV-2 by FDA under an Emergency Use Authorization (EUA). This EUA will remain in effect (meaning this test can be used) for the duration of the COVID-19 declaration under Section 564(b)(1) of the Act, 21 U.S.C. section 360bbb-3(b)(1), unless the authorization is terminated or revoked.  Performed at [facility], [place] [address] [place], [place] [address]   MRSA PCR Screening     Status: None   Collection Time: 11/30/20 10:22 AM    Specimen: Nasopharyngeal  Result Value Ref Range Status   MRSA by PCR NEGATIVE NEGATIVE Final    Comment:        The GeneXpert MRSA Assay (FDA approved for NASAL specimens only), is one component of a comprehensive MRSA colonization surveillance program. It is not intended to diagnose MRSA infection nor to guide or monitor treatment for MRSA infections. Performed at [facility], [place] [address] [place], [place] [address]     Studies/Results: DG Chest 1 View  Result Date: 11/29/2020 CLINICAL DATA:  Hypoxia EXAM: CHEST  1 VIEW COMPARISON:  08/03/2020 FINDINGS: Since the prior examination, there has developed right basilar consolidation, possibly infectious in the appropriate clinical setting. Mild focal infiltrate is also noted within the right apex. Left lung is clear. No pneumothorax or pleural effusion. Median sternotomy has been performed. Cardiac size within normal limits. Multiple healed right rib fractures are noted. Multiple surgical clips are seen within the right axilla. IMPRESSION: Interval development of multifocal pulmonary infiltrate within the right lung, more focal within the right lung base, suspicious for atypical infection in the acute setting. Electronically Signed   By: Fidela Salisbury MD   On: 11/29/2020 22:27   DG C-Arm 1-60 Min-No Report  Result Date: 11/29/2020 Fluoroscopy was utilized by the requesting physician.  No radiographic interpretation.   ECHOCARDIOGRAM COMPLETE  Result Date: 11/30/2020    ECHOCARDIOGRAM REPORT   Patient Name:   Glenn Gonzalez Date of Exam: 11/30/2020 Medical Rec #:  [id number]  Height:       74.0 in Accession #:    [id number]        Weight:       221.0 lb Date of Birth:  [birth year]        BSA:          2.268 m Patient Age:    38 years          BP:           103/59 mmHg Patient Gender: M                 HR:           94 bpm. Exam Location:  Inpatient Procedure: 2D Echo, Cardiac Doppler, Color Doppler and  Intracardiac            Opacification Agent Indications:    Dyspnea R06.00  History:        Patient has prior history of Echocardiogram examinations, most                 recent 05/12/2017. Arrythmias:Atrial Fibrillation; Risk                 Factors:Non-Smoker. GERD. 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  Sonographer:    Vickie Epley RDCS Referring Phys: [id number] [facility]  Sonographer Comments: Suboptimal apical window and suboptimal subcostal window. Pt unable to turn on side due to herniated discs. IMPRESSIONS  1. Left ventricular ejection fraction, by estimation, is 60 to 65%. The left ventricle has normal function. The left ventricle has no regional wall motion abnormalities. Left ventricular diastolic parameters were normal.  2. Right ventricular systolic function is normal. The right ventricular size is normal. There is normal pulmonary artery systolic pressure. The estimated right ventricular systolic pressure is 60.6 mmHg.  3. The mitral valve is normal in structure. No evidence of mitral valve regurgitation. No evidence of mitral stenosis.  4. The aortic valve is normal in structure. Aortic valve regurgitation is not visualized. No aortic stenosis is present.  5. The inferior vena cava is normal in size with greater than 50% respiratory variability, suggesting right atrial pressure of 3 mmHg. FINDINGS  Left Ventricle: Left ventricular ejection fraction, by estimation, is 60 to 65%. The left ventricle has normal function. The left ventricle has no regional wall motion abnormalities. Definity contrast agent was given IV to delineate the left ventricular  endocardial borders. The left ventricular internal cavity size was normal in size. There is no left ventricular hypertrophy. Left ventricular diastolic parameters were normal. Normal left ventricular filling pressure. Right Ventricle: The right ventricular size is normal. No increase in right ventricular wall thickness. Right ventricular systolic function is  normal. There is normal pulmonary artery systolic pressure. The tricuspid regurgitant velocity is 2.39 m/s, and  with an assumed right atrial pressure of 3 mmHg, the estimated right ventricular systolic pressure is 30.1 mmHg. Left Atrium: Left atrial size was normal in size. Right Atrium: Right atrial size was normal in size. Pericardium: There is no evidence of pericardial effusion. Mitral Valve: The mitral valve is normal in structure. No evidence of mitral valve regurgitation. No evidence of mitral valve stenosis. Tricuspid Valve: The tricuspid valve is normal in structure. Tricuspid valve regurgitation is mild . No evidence of tricuspid stenosis. Aortic Valve: The aortic valve is normal in structure. Aortic valve regurgitation is not visualized. No aortic stenosis is present. Pulmonic Valve: The pulmonic valve was normal in structure. Pulmonic valve regurgitation is not visualized. No evidence of  pulmonic stenosis. Aorta: The aortic root is normal in size and structure. Venous: The inferior vena cava is normal in size with greater than 50% respiratory variability, suggesting right atrial pressure of 3 mmHg. IAS/Shunts: No atrial level shunt detected by color flow Doppler.  LEFT VENTRICLE PLAX 2D LVIDd:         4.40 cm     Diastology LVIDs:         3.20 cm     LV e' medial:    10.00 cm/s LV PW:         0.90 cm     LV E/e' medial:  7.5 LV IVS:        0.90 cm     LV e' lateral:   11.30 cm/s LVOT diam:     2.50 cm     LV E/e' lateral: 6.7 LV SV:         48 LV SV Index:   21 LVOT Area:     4.91 cm  LV Volumes (MOD) LV vol d, MOD A4C: 80.2 ml LV vol s, MOD A4C: 30.4 ml LV SV MOD A4C:     80.2 ml LEFT ATRIUM           Index LA diam:      2.70 cm 1.19 cm/m LA Vol (A4C): 16.3 ml 7.19 ml/m  AORTIC VALVE LVOT Vmax:   50.20 cm/s LVOT Vmean:  36.700 cm/s LVOT VTI:    0.099 m  AORTA Ao Root diam: 3.40 cm MITRAL VALVE               TRICUSPID VALVE MV Area (PHT): 5.38 cm    TR Peak grad:   22.8 mmHg MV Decel Time: 141 msec     TR Vmax:        239.00 cm/s MV E velocity: 75.40 cm/s MV A velocity: 36.00 cm/s  SHUNTS MV E/A ratio:  2.09        Systemic VTI:  0.10 m                            Systemic Diam: 2.50 cm Mihai Croitoru MD Electronically signed by Sanda Klein MD Signature Date/Time: 11/30/2020/12:30:40 PM    Final     Assessment/Plan: 1- RIGHT Hydronephrosis, Acute on Chronic Renal Insufficiency, Possible Malignant Obstruction - temporized with stent that appears in good positoin. HIs GFR will likely improve towards baseline. Will likely need node BX at some point to r/o recurrent lymphoma v. Metastatic urothelial cancer, he is at risk of ALL forms of neoplasia on his necessary immune supression. Needs Heme Onc consult to help with lymphadenopathy issue.  2 - High Grade Superficial Bladder Cancer - bladder disease free grossly at present.   3 - Hypoxia, Tachycardia, Possible Pneumonia After Ureteral Stent - mostly likely PNA v. Early pyelo based on clinical course. Greatly appreciate hospitalist team comanagment. No additional recent CX data from our office to guide therapy. Agree with current ABX.Await final urine C/s  Discussed CT findings and need for probable biopsy of lymph nodes at some point when renal function stabilizes and feeling better.  Approximate 20 minutes spent discussing situation with the patient today.   LOS: 2 days   Remi Haggard 12/25/2020, 8:27 AM

## 2020-12-01 NOTE — Progress Notes (Signed)
[place] for heparin Indication: pulmonary embolus  Allergies  Allergen Reactions  . Digoxin Other (See Comments)    gynecomastia   . Phencyclidine Other (See Comments)    PCP derived antibiotic > Insomnia  . Spironolactone Other (See Comments)    Gynecomastia  . Oxybutynin Chloride     Other reaction(s): low BP  . Lactose Intolerance (Gi) Diarrhea and Other (See Comments)    cramps  . Penicillins Other (See Comments)    Cramps in stomach Did it involve swelling of the face/tongue/throat, SOB, or low BP? No Did it involve sudden or severe rash/hives, skin peeling, or any reaction on the inside of your mouth or nose? No Did you need to seek medical attention at a hospital or doctor's office? No When did it last happen?unknown If all above answers are "NO", may proceed with cephalosporin use.     . Sulfur Rash    Patient Measurements: Height: 6\' 2"  (188 cm) Weight: 100.2 kg (221 lb) IBW/kg (Calculated) : 82.2 Heparin Dosing Weight: TBW  Vital Signs: Temp: 99.4 F (37.4 C) (01/01 0100) Temp Source: Oral (01/01 0100) BP: 155/93 (01/01 0000) Pulse Rate: 100 (01/01 0000)  Labs: Recent Labs    11/29/20 1516 11/29/20 1520 11/29/20 2250 11/30/20 0031 11/30/20 0255 11/30/20 0255 11/30/20 0614 11/30/20 0955 11/30/20 1741 12/12/2020 0117  HGB 14.0  --  13.3  --  12.9*  --   --   --   --   --   HCT 43.1  --  39.6  --  39.5  --   --   --   --   --   PLT 415*  --  381  --  370  --   --   --   --   --   APTT  --   --   --  22*  --   --   --   --   --   --   LABPROT  --   --   --  12.9  --   --   --   --   --   --   INR  --   --   --  1.0  --   --   --   --   --   --   HEPARINUNFRC  --   --   --   --  <0.10*   < >  --  0.21* 0.25* 0.64  CREATININE  --  2.49* 2.29*  --  2.46*  --   --   --   --   --   TROPONINIHS  --   --   --  8 6  --  8  --   --   --    < > = values in this interval not displayed.    Estimated Creatinine  Clearance: 40.9 mL/min (A) (by C-G formula based on SCr of 2.46 mg/dL (H)).   Medical History: Past Medical History:  Diagnosis Date  . A-fib ([place])    IN PAST with old heart   . Bladder tumor   . Bulging lumbar disc    states " i threw my back out on tuesday 6-16, i ahve a bulging disc, but i can lie on my back a side "   . Chronic fatigue   . Chronic kidney disease    CREATININE NOW 1.5 stage 1 ckd  . Colon cancer ([place]) DX 2019   SURGERY  DONE  . Complication of anesthesia    AFTER HEART TRANSPLANT TOOK 2 YEARS TO GET BACK TO SELF; patient does not want aneshesia for endoscopy procedures   . Complication of anesthesia    likes propofol wants as few of anesthesia drugs as possible  . Depression    SITUATIONAL  . GERD (gastroesophageal reflux disease)   . Gout yrs ago  . History of chemotherapy 1982 for hodgkins   DAMAGED HEART MUSCLE WITH RADIATION ALSO  . History of kidney stones   . History of radiation therapy 1982   for hodgkins lymphoma  . Hodgkin's lymphoma ([place]) 1980   IIIB. 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  . Hypothyroidism   . Pneumonia FROM CHF 2016   recurrent pneumonia sedf.  rad tx for lymphoma  . Skin abnormality    right chest small area with squamous cell area removed 3 weeks ago as of 07-27-2020  . Skin cancer    AREAS REMOVED FROM [facility], CHECK AND BACK AND HEAD SEPT 2019  . Status post heart transplantation ([place]) 2017   caused by chemo drug adriomycin     Assessment: 60 yo male undergoing treatment for bladder cancer brought in today for semiurgent right ureteral stent.  Pt has history of cardiac transplant and lymphoma.  Pt developed chills and tachycardia in PACU.  Pharmacy consulted to dose heparin for possible PE.  No prior AC noted  12/05/2020 1:57 AM   HL 0.64 therapeutic at 2100 units/hr  CBC stable (12/31)  No bleeding or line issues per RN   Goal of Therapy:  Heparin level 0.3-0.7 units/ml Monitor platelets by  anticoagulation protocol   Plan:   Continue heparin infusion at 2100 units/hr  Heparin level in 6 hours  F/u VQ scan results  Daily CBC   Dolly Rias RPh 12/08/2020, 2:01 AM

## 2020-12-01 NOTE — Progress Notes (Addendum)
[place] for heparin Indication: pulmonary embolus  Allergies  Allergen Reactions  . Digoxin Other (See Comments)    gynecomastia   . Phencyclidine Other (See Comments)    PCP derived antibiotic > Insomnia  . Spironolactone Other (See Comments)    Gynecomastia  . Oxybutynin Chloride     Other reaction(s): low BP  . Lactose Intolerance (Gi) Diarrhea and Other (See Comments)    cramps  . Penicillins Other (See Comments)    Cramps in stomach Did it involve swelling of the face/tongue/throat, SOB, or low BP? No Did it involve sudden or severe rash/hives, skin peeling, or any reaction on the inside of your mouth or nose? No Did you need to seek medical attention at a hospital or doctor's office? No When did it last happen?unknown If all above answers are "NO", may proceed with cephalosporin use.     . Sulfur Rash   Patient Measurements: Height: 6\' 2"  (188 cm) Weight: 100.2 kg (221 lb) IBW/kg (Calculated) : 82.2 Heparin Dosing Weight: TBW  Vital Signs: Temp: 99.1 F (37.3 C) (01/01 0410) Temp Source: Oral (01/01 0410) BP: 155/93 (01/01 0000) Pulse Rate: 104 (01/01 0600)  Labs: Recent Labs    11/29/20 2250 11/30/20 0031 11/30/20 0255 11/30/20 5916 11/30/20 0955 11/30/20 1741 12/24/2020 0117 12/16/2020 0639  HGB 13.3  --  12.9*  --   --   --   --  12.8*  HCT 39.6  --  39.5  --   --   --   --  38.5*  PLT 381  --  370  --   --   --   --  373  APTT  --  22*  --   --   --   --   --   --   LABPROT  --  12.9  --   --   --   --   --   --   INR  --  1.0  --   --   --   --   --   --   HEPARINUNFRC  --   --  <0.10*  --    < > 0.25* 0.64 0.64  CREATININE 2.29*  --  2.46*  --   --   --   --  2.34*  TROPONINIHS  --  8 6 8   --   --   --   --    < > = values in this interval not displayed.   Estimated Creatinine Clearance: 43 mL/min (A) (by C-G formula based on SCr of 2.34 mg/dL (H)).  Medical History: Past Medical History:   Diagnosis Date  . A-fib ([place])    IN PAST with old heart   . Bladder tumor   . Bulging lumbar disc    states " i threw my back out on tuesday 6-16, i ahve a bulging disc, but i can lie on my back a side "   . Chronic fatigue   . Chronic kidney disease    CREATININE NOW 1.5 stage 1 ckd  . Colon cancer ([place]) DX 2019   SURGERY DONE  . Complication of anesthesia    AFTER HEART TRANSPLANT TOOK 2 YEARS TO GET BACK TO SELF; patient does not want aneshesia for endoscopy procedures   . Complication of anesthesia    likes propofol wants as few of anesthesia drugs as possible  . Depression    SITUATIONAL  . GERD (gastroesophageal reflux disease)   .  Gout yrs ago  . History of chemotherapy 1982 for hodgkins   DAMAGED HEART MUSCLE WITH RADIATION ALSO  . History of kidney stones   . History of radiation therapy 1982   for hodgkins lymphoma  . Hodgkin's lymphoma ([place]) 1980   IIIB. 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  . Hypothyroidism   . Pneumonia FROM CHF 2016   recurrent pneumonia sedf.  rad tx for lymphoma  . Skin abnormality    right chest small area with squamous cell area removed 3 weeks ago as of 07-27-2020  . Skin cancer    AREAS REMOVED FROM [facility], CHECK AND BACK AND HEAD SEPT 2019  . 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  Pt has history of cardiac transplant and lymphoma.  Pt developed chills and tachycardia in PACU.  Pharmacy consulted to dose heparin for possible PE.  No prior AC noted  12/08/2020 7:31 AM   0117 HL 0.64 therapeutic at 2100 units/hr  CBC stable (12/31)  No bleeding or line issues per RN  0639 HL 0.64 - unchanged on 2100 units/hr   Goal of Therapy:  Heparin level 0.3-0.7 units/ml Monitor platelets by anticoagulation protocol   Plan:   Empirically reduce heparin infusion to 2000  units/hr with Hep level at high therapeutic range  Not a candidate for CT with renal dysfx  Ordered VQ scan - appears Nuc Med not staffed on weekend, sent msg to MD  Daily CBC, daily Heparin level  Minda Ditto PharmD [facility] Rx [id number] 12/30/2020, 7:33 AM  1000 Addendum: RN notified Rx of blood in urine, patient requested Heparin off - drip turned off per RN MD notified, await other possible anti-coagulation if deemed necessary  Minda Ditto PharmD 12/22/2020, 9:54 AM

## 2020-12-02 ENCOUNTER — Inpatient Hospital Stay (HOSPITAL_COMMUNITY): Payer: 59

## 2020-12-02 DIAGNOSIS — J189 Pneumonia, unspecified organism: Secondary | ICD-10-CM

## 2020-12-02 DIAGNOSIS — R52 Pain, unspecified: Secondary | ICD-10-CM

## 2020-12-02 LAB — CBC WITH DIFFERENTIAL/PLATELET
Abs Immature Granulocytes: 0.11 10*3/uL — ABNORMAL HIGH (ref 0.00–0.07)
Basophils Absolute: 0.1 10*3/uL (ref 0.0–0.1)
Basophils Relative: 0 %
Eosinophils Absolute: 0.1 10*3/uL (ref 0.0–0.5)
Eosinophils Relative: 1 %
HCT: 40.2 % (ref 39.0–52.0)
Hemoglobin: 12.9 g/dL — ABNORMAL LOW (ref 13.0–17.0)
Immature Granulocytes: 1 %
Lymphocytes Relative: 3 %
Lymphs Abs: 0.6 10*3/uL — ABNORMAL LOW (ref 0.7–4.0)
MCH: 31.4 pg (ref 26.0–34.0)
MCHC: 32.1 g/dL (ref 30.0–36.0)
MCV: 97.8 fL (ref 80.0–100.0)
Monocytes Absolute: 2 10*3/uL — ABNORMAL HIGH (ref 0.1–1.0)
Monocytes Relative: 10 %
Neutro Abs: 16.9 10*3/uL — ABNORMAL HIGH (ref 1.7–7.7)
Neutrophils Relative %: 85 %
Platelets: 409 10*3/uL — ABNORMAL HIGH (ref 150–400)
RBC: 4.11 MIL/uL — ABNORMAL LOW (ref 4.22–5.81)
RDW: 16 % — ABNORMAL HIGH (ref 11.5–15.5)
WBC: 19.8 10*3/uL — ABNORMAL HIGH (ref 4.0–10.5)
nRBC: 0 % (ref 0.0–0.2)

## 2020-12-02 LAB — BASIC METABOLIC PANEL
Anion gap: 12 (ref 5–15)
BUN: 24 mg/dL — ABNORMAL HIGH (ref 6–20)
CO2: 23 mmol/L (ref 22–32)
Calcium: 8.6 mg/dL — ABNORMAL LOW (ref 8.9–10.3)
Chloride: 97 mmol/L — ABNORMAL LOW (ref 98–111)
Creatinine, Ser: 1.97 mg/dL — ABNORMAL HIGH (ref 0.61–1.24)
GFR, Estimated: 38 mL/min — ABNORMAL LOW (ref >60)
Glucose, Bld: 102 mg/dL — ABNORMAL HIGH (ref 70–99)
Potassium: 3.8 mmol/L (ref 3.5–5.1)
Sodium: 132 mmol/L — ABNORMAL LOW (ref 135–145)

## 2020-12-02 LAB — MAGNESIUM: Magnesium: 1.7 mg/dL (ref 1.7–2.4)

## 2020-12-02 LAB — LACTATE DEHYDROGENASE: LDH: 492 U/L — ABNORMAL HIGH (ref 98–192)

## 2020-12-02 MED ORDER — ZOLPIDEM TARTRATE 5 MG PO TABS
5.0000 mg | ORAL_TABLET | Freq: Every evening | ORAL | Status: DC | PRN
  Administered 2020-12-02: 5 mg via ORAL
  Filled 2020-12-02: qty 1

## 2020-12-02 MED ORDER — RABEPRAZOLE SODIUM 20 MG PO TBEC
40.0000 mg | DELAYED_RELEASE_TABLET | Freq: Two times a day (BID) | ORAL | Status: DC
  Administered 2020-12-02 – 2020-12-03 (×2): 40 mg via ORAL

## 2020-12-02 MED ORDER — RABEPRAZOLE SODIUM 20 MG PO TBEC: 40.0000 mg | DELAYED_RELEASE_TABLET | Freq: Two times a day (BID) | ORAL | Status: DC

## 2020-12-02 NOTE — Progress Notes (Signed)
Bilateral lower extremity venous study completed.      Please see CV Proc for preliminary results.   Oree Mirelez, RVT  

## 2020-12-02 NOTE — Plan of Care (Signed)
  Problem: Clinical Measurements: Goal: Ability to maintain clinical measurements within normal limits will improve Outcome: Progressing   

## 2020-12-02 NOTE — Progress Notes (Signed)
3 Days Post-Op Subjective: Patient reports feeling better.  Less nausea.  Continues to have some intermittent hematuria.  VQ scan was intermediate risk but not having shortness of breath.  Patient having Doppler study of lower extremities this a.m.Marland Kitchen White blood cell count still 19.8.  Creatinine improved to 1.97  Objective: Vital signs in last 24 hours: Temp:  [97.8 F (36.6 C)-99.5 F (37.5 C)] 98.9 F (37.2 C) (01/02 0517) Pulse Rate:  [95-106] 98 (01/02 0517) Resp:  [15-22] 16 (01/02 0517) BP: (138-169)/(83-99) 169/96 (01/02 0517) SpO2:  [95 %-100 %] 96 % (01/02 0517)  Intake/Output from previous day: 01/01 0701 - 01/02 0700 In: 3503.8 [I.V.:3203.8; IV Piggyback:300] Out: 2263 [Urine:3275] Intake/Output this shift: No intake/output data recorded.  Physical Exam:  General:alert, cooperative and no distress     Lab Results: Recent Labs    11/30/20 0255 12/31/2020 0639 12/02/20 0522  HGB 12.9* 12.8* 12.9*  HCT 39.5 38.5* 40.2   BMET Recent Labs    12/11/2020 0639 12/02/20 0522  NA 130* 132*  K 4.3 3.8  CL 97* 97*  CO2 23 23  GLUCOSE 120* 102*  BUN 32* 24*  CREATININE 2.34* 1.97*  CALCIUM 8.5* 8.6*   Recent Labs    11/30/20 0031  INR 1.0   No results for input(s): LABURIN in the last 72 hours. Results for orders placed or performed during the hospital encounter of 11/29/20  SARS Coronavirus 2 by RT PCR (hospital order, performed in [facility] hospital lab) Nasopharyngeal Nasopharyngeal Swab     Status: None   Collection Time: 11/29/20  2:45 PM   Specimen: Nasopharyngeal Swab  Result Value Ref Range Status   SARS Coronavirus 2 NEGATIVE NEGATIVE Final    Comment: (NOTE) SARS-CoV-2 target nucleic acids are NOT DETECTED.  The SARS-CoV-2 RNA is generally detectable in upper and lower respiratory specimens during the acute phase of infection. The lowest concentration of SARS-CoV-2 viral copies this assay can detect is 250 copies / mL. A negative result does  not preclude SARS-CoV-2 infection and should not be used as the sole basis for treatment or other patient management decisions.  A negative result may occur with improper specimen collection / handling, submission of specimen other than nasopharyngeal swab, presence of viral mutation(s) within the areas targeted by this assay, and inadequate number of viral copies (<250 copies / mL). A negative result must be combined with clinical observations, patient history, and epidemiological information.  Fact Sheet for Patients:   StrictlyIdeas.no  Fact Sheet for Healthcare Providers: BankingDealers.co.za  This test is not yet approved or  cleared by the [place] FDA and has been authorized for detection and/or diagnosis of SARS-CoV-2 by FDA under an Emergency Use Authorization (EUA).  This EUA will remain in effect (meaning this test can be used) for the duration of the COVID-19 declaration under Section 564(b)(1) of the Act, 21 U.S.C. section 360bbb-3(b)(1), unless the authorization is terminated or revoked sooner.  Performed at [facility], [place] [address] [place], [place] [address]   Urine Culture     Status: None   Collection Time: 11/29/20  6:35 PM   Specimen: PATH Other; Tissue  Result Value Ref Range Status   Specimen Description   Final    URINE, RANDOM Performed at [place] [address], [place], [place] [address]    Special Requests   Final    NONE Performed at [facility], [place] [address], [place], [place] [address]  Culture   Final    NO GROWTH Performed at [facility], [place] [address], [place], [place] [address]    Report Status 12/15/2020 FINAL  Final  Culture, blood (x 2)     Status: None (Preliminary result)   Collection Time: 11/30/20 12:31 AM   Specimen: BLOOD  Result Value Ref Range Status   Specimen Description   Final    BLOOD RIGHT  HAND Performed at [place] [address], [place], [place] [address]    Special Requests   Final    BOTTLES DRAWN AEROBIC ONLY Blood Culture adequate volume Performed at [place] [address], [place], [place] [address]    Culture   Final    NO GROWTH 1 DAY Performed at [facility], [place] [address], [place], [place] [address]    Report Status PENDING  Incomplete  Culture, blood (x 2)     Status: None (Preliminary result)   Collection Time: 11/30/20 12:31 AM   Specimen: BLOOD  Result Value Ref Range Status   Specimen Description   Final    BLOOD LEFT HAND Performed at [place] [address] [place], [place] [address]    Special Requests   Final    BOTTLES DRAWN AEROBIC ONLY Blood Culture adequate volume Performed at [place] [address] [place], [place] [address]    Culture   Final    NO GROWTH 1 DAY Performed at [facility], [place] [address], [place],  [address]    Report Status PENDING  Incomplete  Resp Panel by RT-PCR (Flu A&B, Covid) Nasopharyngeal Swab     Status: None   Collection Time: 11/30/20  4:30 AM   Specimen: Nasopharyngeal Swab; Nasopharyngeal(NP) swabs in vial transport medium  Result Value Ref Range Status   SARS Coronavirus 2 by RT PCR NEGATIVE NEGATIVE Final    Comment: (NOTE) SARS-CoV-2 target nucleic acids are NOT DETECTED.  The SARS-CoV-2 RNA is generally detectable in upper respiratory specimens during the acute phase of infection. The lowest concentration of SARS-CoV-2 viral copies this assay can detect is 138 copies/mL. A negative result does not preclude SARS-Cov-2 infection and should not be used as the sole basis for treatment or other patient management decisions. A negative result may occur with  improper specimen collection/handling, submission of specimen other than nasopharyngeal swab, presence of viral mutation(s) within  the areas targeted by this assay, and inadequate number of viral copies(<138 copies/mL). A negative result must be combined with clinical observations, patient history, and epidemiological information. The expected result is Negative.  Fact Sheet for Patients:  EntrepreneurPulse.com.au  Fact Sheet for Healthcare Providers:  IncredibleEmployment.be  This test is no t yet approved or cleared by the [place] FDA and  has been authorized for detection and/or diagnosis of SARS-CoV-2 by FDA under an Emergency Use Authorization (EUA). This EUA will remain  in effect (meaning this test can be used) for the duration of the COVID-19 declaration under Section 564(b)(1) of the Act, 21 U.S.C.section 360bbb-3(b)(1), unless the authorization is terminated  or revoked sooner.       Influenza A by PCR NEGATIVE NEGATIVE Final   Influenza B by PCR NEGATIVE NEGATIVE Final    Comment: (NOTE) The Xpert Xpress SARS-CoV-2/FLU/RSV plus assay is intended as an aid in the diagnosis of influenza from Nasopharyngeal swab specimens and should not be used as a sole basis for treatment. Nasal washings and  aspirates are unacceptable for Xpert Xpress SARS-CoV-2/FLU/RSV testing.  Fact Sheet for Patients: EntrepreneurPulse.com.au  Fact Sheet for Healthcare Providers: IncredibleEmployment.be  This test is not yet approved or cleared by the [place] FDA and has been authorized for detection and/or diagnosis of SARS-CoV-2 by FDA under an Emergency Use Authorization (EUA). This EUA will remain in effect (meaning this test can be used) for the duration of the COVID-19 declaration under Section 564(b)(1) of the Act, 21 U.S.C. section 360bbb-3(b)(1), unless the authorization is terminated or revoked.  Performed at [facility], [place] [address] [place], [place] [address]   MRSA PCR Screening     Status: None    Collection Time: 11/30/20 10:22 AM   Specimen: Nasopharyngeal  Result Value Ref Range Status   MRSA by PCR NEGATIVE NEGATIVE Final    Comment:        The GeneXpert MRSA Assay (FDA approved for NASAL specimens only), is one component of a comprehensive MRSA colonization surveillance program. It is not intended to diagnose MRSA infection nor to guide or monitor treatment for MRSA infections. Performed at [facility], [place] [address] [place], [place] [address]     Studies/Results: DG Chest 2 View  Result Date: 12/14/2020 CLINICAL DATA:  Bladder cancer.  Follow-up multifocal pneumonia. EXAM: CHEST - 2 VIEW COMPARISON:  11/29/2020 FINDINGS: Prior median sternotomy. Heart is normal size. Patchy right lung airspace disease in the right apex and right lower lung. Minimal left base linear atelectasis or scarring. Overall aeration in the right lung slightly improved. No effusions. IMPRESSION: Patchy right lung airspace disease with slight improvement since prior study. Left base atelectasis or scarring. Electronically Signed   By: Rolm Baptise M.D.   On: 12/03/2020 13:25   NM Pulmonary Perfusion  Result Date: 12/14/2020 CLINICAL DATA:  Respiratory failure EXAM: NUCLEAR MEDICINE PERFUSION LUNG SCAN TECHNIQUE: Perfusion images were obtained in multiple projections after intravenous injection of radiopharmaceutical. Ventilation scans intentionally deferred if perfusion scan and chest x-ray adequate for interpretation during COVID 19 epidemic. RADIOPHARMACEUTICALS:  4.1 mCi Tc-36m MAA IV COMPARISON:  Chest x-ray 12/19/2020 FINDINGS: Slightly heterogeneous distribution of radiotracer within the lung fields. Multiple small bilateral wedge-shaped perfusion defects. No corresponding radiographic abnormality. No large mismatched segmental perfusion defect. IMPRESSION: Intermediate probability for pulmonary embolism. Electronically Signed   By: Davina Poke D.O.   On: 12/28/2020 13:30    ECHOCARDIOGRAM COMPLETE  Result Date: 11/30/2020    ECHOCARDIOGRAM REPORT   Patient Name:   Glenn Gonzalez Havener Date of Exam: 11/30/2020 Medical Rec #:  [id number]         Height:       74.0 in Accession #:    [id number]        Weight:       221.0 lb Date of Birth:  [birth year]        BSA:          2.268 m Patient Age:    60 years          BP:           103/59 mmHg Patient Gender: M                 HR:           94 bpm. Exam Location:  Inpatient Procedure: 2D Echo, Cardiac Doppler, Color Doppler and Intracardiac            Opacification Agent Indications:    Dyspnea R06.00  History:  Patient has prior history of Echocardiogram examinations, most                 recent 05/12/2017. Arrythmias:Atrial Fibrillation; Risk                 Factors:Non-Smoker. GERD. 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  Sonographer:    Vickie Epley RDCS Referring Phys: [id number] [facility]  Sonographer Comments: Suboptimal apical window and suboptimal subcostal window. Pt unable to turn on side due to herniated discs. IMPRESSIONS  1. Left ventricular ejection fraction, by estimation, is 60 to 65%. The left ventricle has normal function. The left ventricle has no regional wall motion abnormalities. Left ventricular diastolic parameters were normal.  2. Right ventricular systolic function is normal. The right ventricular size is normal. There is normal pulmonary artery systolic pressure. The estimated right ventricular systolic pressure is 91.4 mmHg.  3. The mitral valve is normal in structure. No evidence of mitral valve regurgitation. No evidence of mitral stenosis.  4. The aortic valve is normal in structure. Aortic valve regurgitation is not visualized. No aortic stenosis is present.  5. The inferior vena cava is normal in size with greater than 50% respiratory variability, suggesting right atrial pressure of 3 mmHg. FINDINGS  Left Ventricle: Left ventricular ejection fraction, by estimation, is 60 to 65%. The left ventricle has  normal function. The left ventricle has no regional wall motion abnormalities. Definity contrast agent was given IV to delineate the left ventricular  endocardial borders. The left ventricular internal cavity size was normal in size. There is no left ventricular hypertrophy. Left ventricular diastolic parameters were normal. Normal left ventricular filling pressure. Right Ventricle: The right ventricular size is normal. No increase in right ventricular wall thickness. Right ventricular systolic function is normal. There is normal pulmonary artery systolic pressure. The tricuspid regurgitant velocity is 2.39 m/s, and  with an assumed right atrial pressure of 3 mmHg, the estimated right ventricular systolic pressure is 78.2 mmHg. Left Atrium: Left atrial size was normal in size. Right Atrium: Right atrial size was normal in size. Pericardium: There is no evidence of pericardial effusion. Mitral Valve: The mitral valve is normal in structure. No evidence of mitral valve regurgitation. No evidence of mitral valve stenosis. Tricuspid Valve: The tricuspid valve is normal in structure. Tricuspid valve regurgitation is mild . No evidence of tricuspid stenosis. Aortic Valve: The aortic valve is normal in structure. Aortic valve regurgitation is not visualized. No aortic stenosis is present. Pulmonic Valve: The pulmonic valve was normal in structure. Pulmonic valve regurgitation is not visualized. No evidence of pulmonic stenosis. Aorta: The aortic root is normal in size and structure. Venous: The inferior vena cava is normal in size with greater than 50% respiratory variability, suggesting right atrial pressure of 3 mmHg. IAS/Shunts: No atrial level shunt detected by color flow Doppler.  LEFT VENTRICLE PLAX 2D LVIDd:         4.40 cm     Diastology LVIDs:         3.20 cm     LV e' medial:    10.00 cm/s LV PW:         0.90 cm     LV E/e' medial:  7.5 LV IVS:        0.90 cm     LV e' lateral:   11.30 cm/s LVOT diam:     2.50 cm      LV E/e' lateral: 6.7 LV SV:  48 LV SV Index:   21 LVOT Area:     4.91 cm  LV Volumes (MOD) LV vol d, MOD A4C: 80.2 ml LV vol s, MOD A4C: 30.4 ml LV SV MOD A4C:     80.2 ml LEFT ATRIUM           Index LA diam:      2.70 cm 1.19 cm/m LA Vol (A4C): 16.3 ml 7.19 ml/m  AORTIC VALVE LVOT Vmax:   50.20 cm/s LVOT Vmean:  36.700 cm/s LVOT VTI:    0.099 m  AORTA Ao Root diam: 3.40 cm MITRAL VALVE               TRICUSPID VALVE MV Area (PHT): 5.38 cm    TR Peak grad:   22.8 mmHg MV Decel Time: 141 msec    TR Vmax:        239.00 cm/s MV E velocity: 75.40 cm/s MV A velocity: 36.00 cm/s  SHUNTS MV E/A ratio:  2.09        Systemic VTI:  0.10 m                            Systemic Diam: 2.50 cm Mihai Croitoru MD Electronically signed by Sanda Klein MD Signature Date/Time: 11/30/2020/12:30:40 PM    Final     Assessment/Plan: 1- RIGHT Hydronephrosis, Acute on Chronic Renal Insufficiency, Possible Malignant Obstruction - temporized with stent that appears in good positoin. HIs GFR will likely improve towards baseline. Will likely need node BX at some point to r/o recurrent lymphoma v. Metastatic urothelial cancer, he is at risk of ALL forms of neoplasia on his necessary immune supression. Needs Heme Onc consult to help with lymphadenopathy issue.  CR improving ,now 1.97 Trying to arrange biopsy of lymph nodes perhaps tomorrow but if cannot schedule may consider discharge home with outpatient follow-up for biopsy. 2 - High Grade Superficial Bladder Cancer - bladder disease free grossly at present.    3 - Hypoxia, Tachycardia, Possible Pneumonia After Ureteral Stent - mostly likely PNA v. Early pyelo based on clinical course. Greatly appreciate hospitalist team comanagment. Urine/blood cx so far negative.  Chest x-ray yesterday improved and regarding to right lower lobe infiltrate/process      LOS: 3 days   Remi Haggard 12/02/2020, 7:58 AM

## 2020-12-02 NOTE — Progress Notes (Signed)
PROGRESS NOTE  Glenn Gonzalez  DOB: 09-Jul-1961  PCP: Lajean Manes, MD AVW:[id number]  DOA: 11/29/2020  LOS: 3 days   Chief complaint: AKI, hydronephrosis  Brief narrative: Glenn Gonzalez is a 60 y.o. male with PMH significant for cardiac transplant 2017 at [facility] on immunosuppressants, history of Hodgkin's lymphoma in 1980s, high-grade T1 bladder cancer, underwent BCG installations November and December 2021, CKD.  He follows up with oncologist Dr. Irene Limbo as an outpatient 12/30, patient was seen at urologist office for progressive rise in creatinine in last few months.  An ultrasound revealed newright hydronephrosis. Therefore he underwent a noncontrast CT scan which showed newright hydroureteronephrosis down to the proximal ureter and possibly obstructed bynewretroperitoneal lymphadenopathy.  The same day, he underwent cystoscopy and right ureteral stent placement.   After the stent was placed, patient developed chills, tachycardia and tachypnea with hypoxia in the PACU.  CXR showed multifocal pneumonia.  Labs showed leukocytosis, hyponatremia and elevated creatinine. Patient was admitted to hospital service for further evaluation and management  Subjective: Patient was seen and examined this morning. Sitting up in bed not in distress.  Not on oxygen.  He mentioned he passed some blood clots this morning.  Assessment/Plan: Sepsis - POA Multifocal pneumonia Immunocompromise status -Chest x-ray with multifocal infiltrate on the right lung. -Currently on IV Rocephin.  Clinically improving. -White count gradually improving daily. -Repeat labs tomorrow. Recent Labs  Lab 11/29/20 1516 11/29/20 2250 11/30/20 0031 11/30/20 0255 12/23/2020 0639 12/02/20 0522  WBC 14.8* 16.8*  --  22.4* 20.5* 19.8*  LATICACIDVEN  --   --  1.2 1.8 0.8  --    Acute hypoxic respiratory failure -Secondary to multifocal pneumonia.  Required 3 L oxygen initially.  Currently off oxygen. -One of  the differential diagnosis on admission was performed embolism.  CT angio of chest could not be done because of AKI and VQ scan was obtained which showed intermediate probability.  Patient was placed on heparin drip empirically however it was held later because of hematuria. -I did not see D-dimer obtained.  Less likely to be pulmonary embolism.  Echocardiogram did not show any right heart strain.  DVT scan of lower extremities negative.  We would not resume anticoagulation.  AKI on CKD 2 -Noted a progressive increase in creatinine in last few months.  Baseline less than 1.5.  Peaked at 2.46.  Improving now.  Continue IV fluid. Recent Labs    08/20/20 0934 08/28/20 0700 08/31/20 1126 09/06/20 0712 11/26/20 1008 11/29/20 1520 11/29/20 2250 11/30/20 0255 12/22/2020 0639 12/02/20 0522  BUN 20 20 15 18  30* 29* 32* 30* 32* 24*  CREATININE 1.52* 1.45* 1.36* 1.39* 2.36* 2.49* 2.29* 2.46* 2.34* 1.97*   Acute hyponatremia -Sodium was lowest 125.  Probably with a poor appetite.  Improving now.  Continue fluid. Recent Labs  Lab 11/26/20 1008 11/29/20 1520 11/29/20 2250 11/30/20 0255 12/28/2020 0639 12/02/20 0522  NA 130* 125* 125* 127* 130* 132*   Severe GERD -Patient reports he has severe reflux because of which he has lost appetite significantly.  Probably contributing to AKI and hyponatremia as well. -He said he has tried different PPIs in the past and currently on rabeprazole 20 mg twice daily but only with partial effect.  He states that he takes it at 10 AM and 10 PM.  He was not aware of and the recommendation to take it before meals.  EGD and colonoscopy last done by Dr. Benson Norway on September 2021. -I would increase the dose  of rabeprazole to 40 mg daily and time it before the meals.  High-grade T1 bladder cancer -underwent BCG installations November and December 2021.  Right hydronephrosis -Status post right ureteral stent placement on December 30 -Had clots this morning.  Continues  to have mild hematuria.  Continue to monitor.  New retroperitoneal lymphadenopathy History of Hodgkin's lymphoma in 1980s -IR consulted for lymph node biopsy for Monday 1/3.  N.p.o. order placed. -Oncologist Dr. Irene Limbo as an outpatient.  H/o heart transplant in 2017 at [facility] -immunosuppressed on envarsus (tacrolimus), prednisone  -Tacrolimus level monitored for renal dosing.  Hypothyroidism  -Continue Synthroid  Tachycardia/hypertension -Status post cardiac transplant.  Probably has baseline fast heartbeat. -Blood pressure elevated 150s.  Patient states that he discussed this with his cardiac transplant team at [facility].  Currently suggested to monitor  Mobility: Encourage ambulation Code Status:   Code Status: Full Code  Nutritional status: Body mass index is 28.37 kg/m.     Diet Order            Diet NPO time specified  Diet effective midnight           Diet regular Room service appropriate? Yes; Fluid consistency: Thin  Diet effective now                 DVT prophylaxis: Place and maintain sequential compression device Start: 12/13/2020 1440   Antimicrobials:  IV Rocephin Fluid: Normal saline at 100 mL/h Consultants: IR, urology Family Communication:  None at bedside   Status is: Inpatient  Remains inpatient appropriate because: Pending lymph node biopsy tomorrow  Dispo: The patient is from: Home              Anticipated d/c is to: Home              Anticipated d/c date is: 1 to 2 days              Patient currently is not medically stable to d/c.       Infusions:  . sodium chloride 100 mL/hr at 12/02/20 1200  . cefTRIAXone (ROCEPHIN)  IV Stopped (12/02/20 0203)    Scheduled Meds: . allopurinol  200 mg Oral Daily  . Azelastine-Fluticasone  1 spray Each Nare BID  . cetirizine  10 mg Oral QHS  . guaiFENesin  600 mg Oral BID  . levothyroxine  125 mcg Oral Q0600  . predniSONE  2.5 mg Oral Daily  . RABEprazole  40 mg Oral BID AC  . Tacrolimus ER  1.5 mg Oral  Daily    Antimicrobials: Anti-infectives (From admission, onward)   Start     Dose/Rate Route Frequency Ordered Stop   11/30/20 0230  cefTRIAXone (ROCEPHIN) 1 g in sodium chloride 0.9 % 100 mL IVPB        1 g 200 mL/hr over 30 Minutes Intravenous Every 24 hours 11/30/20 0139     11/30/20 0230  azithromycin (ZITHROMAX) 500 mg in sodium chloride 0.9 % 250 mL IVPB  Status:  Discontinued        500 mg 250 mL/hr over 60 Minutes Intravenous Every 24 hours 11/30/20 0139 12/30/2020 1155   11/29/20 1445  ceFAZolin (ANCEF) IVPB 2g/100 mL premix        2 g 200 mL/hr over 30 Minutes Intravenous  Once 11/29/20 1443 11/29/20 1746      PRN meds: acetaminophen **OR** acetaminophen, albuterol, alum & mag hydroxide-simeth, calcium carbonate, ondansetron **OR** ondansetron (ZOFRAN) IV, zolpidem   Objective: Vitals:   12/02/20  0810 12/02/20 0819  BP: (!) 167/104 (!) 159/99  Pulse:    Resp: 15 19  Temp:    SpO2: 96% 96%    Intake/Output Summary (Last 24 hours) at 12/02/2020 1412 Last data filed at 12/02/2020 1200 Gross per 24 hour  Intake 3503.83 ml  Output 3200 ml  Net 303.83 ml   Filed Weights   11/29/20 1444  Weight: 100.2 kg   Weight change:  Body mass index is 28.37 kg/m.   Physical Exam: General exam: Pleasant, middle-aged, not in distress Skin: No rashes, lesions or ulcers. HEENT: Atraumatic, normocephalic, no obvious bleeding Lungs: Clear to auscultation bilaterally CVS: Regular rate and rhythm, no murmur GI/Abd soft, nontender, nondistended, bowel sound present CNS: Alert, awake, oriented x3 Psychiatry: Mood appropriate Extremities: No pedal edema, no calf tenderness  Data Review: I have personally reviewed the laboratory data and studies available.  Recent Labs  Lab 11/29/20 1516 11/29/20 2250 11/30/20 0255 12/21/2020 0639 12/02/20 0522  WBC 14.8* 16.8* 22.4* 20.5* 19.8*  NEUTROABS  --  15.7*  --   --  16.9*  HGB 14.0 13.3 12.9* 12.8* 12.9*  HCT 43.1 39.6 39.5 38.5*  40.2  MCV 98.4 95.0 97.5 96.5 97.8  PLT 415* 381 370 373 409*   Recent Labs  Lab 11/29/20 1520 11/29/20 2250 11/30/20 0255 12/27/2020 0639 12/02/20 0522  NA 125* 125* 127* 130* 132*  K 3.9 4.1 4.1 4.3 3.8  CL 89* 91* 92* 97* 97*  CO2 23 22 24 23 23   GLUCOSE 92 122* 116* 120* 102*  BUN 29* 32* 30* 32* 24*  CREATININE 2.49* 2.29* 2.46* 2.34* 1.97*  CALCIUM 8.5* 8.2* 8.2* 8.5* 8.6*  MG  --   --   --   --  1.7    F/u labs ordered  Signed, Terrilee Croak, MD [facility] 12/02/2020

## 2020-12-03 DIAGNOSIS — J189 Pneumonia, unspecified organism: Secondary | ICD-10-CM | POA: Diagnosis not present

## 2020-12-03 LAB — MAGNESIUM: Magnesium: 1.5 mg/dL — ABNORMAL LOW (ref 1.7–2.4)

## 2020-12-03 LAB — CBC WITH DIFFERENTIAL/PLATELET
Abs Immature Granulocytes: 0.16 10*3/uL — ABNORMAL HIGH (ref 0.00–0.07)
Basophils Absolute: 0.1 10*3/uL (ref 0.0–0.1)
Basophils Relative: 0 %
Eosinophils Absolute: 0.1 10*3/uL (ref 0.0–0.5)
Eosinophils Relative: 0 %
HCT: 42.2 % (ref 39.0–52.0)
Hemoglobin: 14.4 g/dL (ref 13.0–17.0)
Immature Granulocytes: 1 %
Lymphocytes Relative: 4 %
Lymphs Abs: 0.8 10*3/uL (ref 0.7–4.0)
MCH: 32.5 pg (ref 26.0–34.0)
MCHC: 34.1 g/dL (ref 30.0–36.0)
MCV: 95.3 fL (ref 80.0–100.0)
Monocytes Absolute: 1.8 10*3/uL — ABNORMAL HIGH (ref 0.1–1.0)
Monocytes Relative: 9 %
Neutro Abs: 16.9 10*3/uL — ABNORMAL HIGH (ref 1.7–7.7)
Neutrophils Relative %: 86 %
Platelets: 460 10*3/uL — ABNORMAL HIGH (ref 150–400)
RBC: 4.43 MIL/uL (ref 4.22–5.81)
RDW: 15.4 % (ref 11.5–15.5)
WBC: 19.7 10*3/uL — ABNORMAL HIGH (ref 4.0–10.5)
nRBC: 0 % (ref 0.0–0.2)

## 2020-12-03 LAB — FERRITIN: Ferritin: 350 ng/mL — ABNORMAL HIGH (ref 24–336)

## 2020-12-03 LAB — PROCALCITONIN: Procalcitonin: 6.19 ng/mL

## 2020-12-03 LAB — BASIC METABOLIC PANEL
Anion gap: 16 — ABNORMAL HIGH (ref 5–15)
BUN: 22 mg/dL — ABNORMAL HIGH (ref 6–20)
CO2: 21 mmol/L — ABNORMAL LOW (ref 22–32)
Calcium: 9.3 mg/dL (ref 8.9–10.3)
Chloride: 93 mmol/L — ABNORMAL LOW (ref 98–111)
Creatinine, Ser: 1.87 mg/dL — ABNORMAL HIGH (ref 0.61–1.24)
GFR, Estimated: 41 mL/min — ABNORMAL LOW (ref >60)
Glucose, Bld: 105 mg/dL — ABNORMAL HIGH (ref 70–99)
Potassium: 3.7 mmol/L (ref 3.5–5.1)
Sodium: 130 mmol/L — ABNORMAL LOW (ref 135–145)

## 2020-12-03 LAB — PHOSPHORUS: Phosphorus: 2.9 mg/dL (ref 2.5–4.6)

## 2020-12-03 LAB — TACROLIMUS LEVEL: Tacrolimus (FK506) - LabCorp: 4.7 ng/mL (ref 2.0–20.0)

## 2020-12-03 MED ORDER — CEFDINIR 300 MG PO CAPS: 300.0000 mg | ORAL_CAPSULE | Freq: Two times a day (BID) | ORAL | 0 refills | Status: AC

## 2020-12-03 MED ORDER — TRAMADOL HCL 50 MG PO TABS
50.0000 mg | ORAL_TABLET | Freq: Once | ORAL | Status: AC
  Administered 2020-12-03: 50 mg via ORAL
  Filled 2020-12-03: qty 1

## 2020-12-03 MED ORDER — RABEPRAZOLE SODIUM 20 MG PO TBEC: 40.0000 mg | DELAYED_RELEASE_TABLET | Freq: Two times a day (BID) | ORAL | 2 refills | Status: AC

## 2020-12-03 MED ORDER — TRAMADOL HCL 50 MG PO TABS: 50.0000 mg | ORAL_TABLET | Freq: Four times a day (QID) | ORAL | 0 refills | Status: AC | PRN

## 2020-12-03 MED ORDER — SACCHAROMYCES BOULARDII 250 MG PO CAPS: 250.0000 mg | ORAL_CAPSULE | Freq: Two times a day (BID) | ORAL | 0 refills | Status: AC

## 2020-12-03 NOTE — Discharge Summary (Signed)
Physician Discharge Summary  Glenn Gonzalez NLZ:[id number] DOB: 11/09/1961 DOA: 11/29/2020  PCP: Lajean Manes, MD  Admit date: 11/29/2020 Discharge date: 12/03/2020  Admitted From: Home Discharge disposition: Home   Code Status: Full Code  Diet Recommendation: Cardiac diet  Discharge Diagnosis:   Principal Problem:   Multifocal pneumonia Active Problems:   Biventricular implantable cardioverter-defibrillator BSx   Status post heart transplantation ([place])   Acute kidney injury superimposed on CKD ([place])   Tachycardia   Hypoxia   Hyponatremia   Sepsis ([place])   Hydronephrosis  Chief complaint: AKI, hydronephrosis  Brief narrative: Glenn Gonzalez is a 60 y.o. male with PMH significant for cardiac transplant 2017 at [facility] on immunosuppressants, history of Hodgkin's lymphoma in 1980s, high-grade T1 bladder cancer, underwent BCG installations November and December 2021, CKD.  He follows up with oncologist Dr. Irene Limbo as an outpatient 12/30, patient was seen at urologist office for progressive rise in creatinine in last few months.  An ultrasound revealed newright hydronephrosis. Therefore he underwent a noncontrast CT scan which showed newright hydroureteronephrosis down to the proximal ureter and possibly obstructed bynewretroperitoneal lymphadenopathy.  The same day, he underwent cystoscopy and right ureteral stent placement.   After the stent was placed, patient developed chills, tachycardia and tachypnea with hypoxia in the PACU.  CXR showed multifocal pneumonia.  Labs showed leukocytosis, hyponatremia and elevated creatinine. Patient was admitted to hospital service for further evaluation and management  Subjective: Patient was seen and examined this morning. Sitting up in bed not in distress.  Not on oxygen.  He mentioned he passed some blood clots this morning.  Hospital course: Septic shock- POA Multifocal pneumonia Immunocompromise status -Chest x-ray with  multifocal infiltrate on the right lung. -He met septic shock criteria on admission with a source of infection, leukocytosis, tachypnea, respiratory failure and hypotension not responsive to fluid. -Currently on IV Rocephin.  Clinically improving. -White count gradually improving daily. -We will discharge the patient home on 7 more days of oral Omnicef with probiotics. Recent Labs  Lab 11/29/20 2250 11/30/20 0031 11/30/20 0255 12/27/2020 0639 12/02/20 0522 12/03/20 0520  WBC 16.8*  --  22.4* 20.5* 19.8* 19.7*  LATICACIDVEN  --  1.2 1.8 0.8  --   --   PROCALCITON  --   --   --   --   --  6.19   Acute hypoxic respiratory failure -Secondary to multifocal pneumonia.  Required 3 L oxygen initially.  Currently off oxygen. -One of the differential diagnosis on admission was pulmonary embolism.  CT angio of chest could not be done because of AKI and VQ scan was obtained which showed intermediate probability.  Patient was placed on heparin drip empirically however it was held later because of hematuria. -I did not see D-dimer obtained.  Less likely to be pulmonary embolism.  Echocardiogram did not show any right heart strain.  DVT scan of lower extremities negative.  We would not resume anticoagulation.  AKI on CKD 2 -Noted a progressive increase in creatinine in last few months.  Baseline less than 1.5.  Peaked at 2.46.  Improving now with IV fluid.  Continue oral hydration at home. Recent Labs    08/28/20 0700 08/31/20 1126 09/06/20 0712 11/26/20 1008 11/29/20 1520 11/29/20 2250 11/30/20 0255 12/14/2020 0639 12/02/20 0522 12/03/20 0520  BUN _0 30* 29* 32* 30* 32* 24* 22*  CREATININE 1.45* 1.36* 1.39* 2.36* 2.49* 2.29* 2.46* 2.34* 1.97* 1.87*   Acute hyponatremia -Sodium was lowest 125. Probably  with a poor appetite. Improving now. Continue hydration Recent Labs  Lab 11/29/20 1520 11/29/20 2250 11/30/20 0255 12/02/2020 0639 12/02/20 0522 12/03/20 0520  NA 125* 125* 127* 130*  132* 130*   Severe GERD -Patient reports he has severe reflux because of which he lost appetite significantly.  Probably contributing to AKI and hyponatremia as well. -He said he has tried different PPIs in the past and currently on rabeprazole 20 mg twice daily but only with partial effect.  He states that he takes it at 10 AM and 10 PM.  He was not aware of and the recommendation to take it before meals.  EGD and colonoscopy last done by Dr. Benson Norway on September 2021. -I would increase the dose of rabeprazole to 40 mg daily.  I have counseled patient to take it before meals  High-grade T1 bladder cancer -underwent BCG installations November and December 2021.  Right hydronephrosis -Status post right ureteral stent placement on December 30 -Had clots yesterday morning.  Hematuria improved. -Follow-up with urology as an outpatient.  New retroperitoneal lymphadenopathy History of Hodgkin's lymphoma in 1980s -Discussed with oncologist Dr. Irene Limbo and IR Dr. Pascal Lux.  Lymphadenopathy could be secondary to ongoing infection.  Patient will follow up with oncologist as an outpatient for repeat scan in next few weeks.  If needed, he will get further intervention which may include minimal biopsy.  H/o heart transplant in 2017 at [facility] -immunosuppressed on envarsus (tacrolimus), prednisone   Hypothyroidism  -Continue Synthroid  Tachycardia/hypertension -Status post cardiac transplant.  Probably has baseline fast heartbeat. -Blood pressure elevated 150s.  Patient states that he is in regular discussion with his cardiac transplant team at [facility].  He would like to discuss with them about initiating any medications for elevated blood pressure.  Discharge home today.  Wound care:    Discharge Exam:   Vitals:   12/02/20 0819 12/02/20 1432 12/02/20 2033 12/03/20 0455  BP: (!) 159/99 (!) 162/98 (!) 169/104 (!) 171/107  Pulse:  100 (!) 103 (!) 108  Resp: _0 Temp:  98.5 F (36.9 C) 99.2 F  (37.3 C) 98.1 F (36.7 C)  TempSrc:  Oral  Oral  SpO2: 96% 97% 98% 96%  Weight:      Height:        Body mass index is 28.37 kg/m.  General exam: Pleasant middle-aged male.  Not in distress Skin: No rashes, lesions or ulcers. HEENT: Atraumatic, normocephalic, no obvious bleeding Lungs: Clear to auscultation bilaterally CVS: Tachycardic, regular rhythm, no murmur GI/Abd soft, nontender, nondistended, bowel sound present CNS: Alert, awake, oriented x3 Psychiatry: Mood appropriate Extremities: No pedal edema, no calf tenderness  Follow ups:   Discharge Instructions    Diet - low sodium heart healthy   Complete by: As directed    Increase activity slowly   Complete by: As directed       Follow-up Information    Remi Haggard, MD On 12/03/2020.   Specialty: Urology Why: at 2 PM with NP Noberto Retort to recheck kidney function  Contact information: [address] [place] [address] [phone]               Recommendations for Outpatient Follow-Up:   1. Follow-up with PCP as an outpatient 2. Follow-up with oncology as an outpatient 3. Follow-up with urology as an outpatient  Discharge Instructions:  Follow with Primary MD Lajean Manes, MD in 7 days   Get CBC/BMP checked in next visit within 1  week by PCP or SNF MD ( we routinely change or add medications that can affect your baseline labs and fluid status, therefore we recommend that you get the mentioned basic workup next visit with your PCP, your PCP may decide not to get them or add new tests based on their clinical decision)  On your next visit with your PCP, please Get Medicines reviewed and adjusted.  Please request your PCP  to go over all Hospital Tests and Procedure/Radiological results at the follow up, please get all Hospital records sent to your Prim MD by signing hospital release before you go home.  Activity: As tolerated with Full fall precautions use walker/cane & assistance as  needed  For Heart failure patients - Check your Weight same time everyday, if you gain over 2 pounds, or you develop in leg swelling, experience more shortness of breath or chest pain, call your Primary MD immediately. Follow Cardiac Low Salt Diet and 1.5 lit/day fluid restriction.  If you have smoked or chewed Tobacco in the last 2 yrs please stop smoking, stop any regular Alcohol  and or any Recreational drug use.  If you experience worsening of your admission symptoms, develop shortness of breath, life threatening emergency, suicidal or homicidal thoughts you must seek medical attention immediately by calling 911 or calling your MD immediately  if symptoms less severe.  You Must read complete instructions/literature along with all the possible adverse reactions/side effects for all the Medicines you take and that have been prescribed to you. Take any new Medicines after you have completely understood and accpet all the possible adverse reactions/side effects.   Do not drive, operate heavy machinery, perform activities at heights, swimming or participation in water activities or provide baby sitting services if your were admitted for syncope or siezures until you have seen by Primary MD or a Neurologist and advised to do so again.  Do not drive when taking Pain medications.  Do not take more than prescribed Pain, Sleep and Anxiety Medications  Wear Seat belts while driving.   Please note You were cared for by a hospitalist during your hospital stay. If you have any questions about your discharge medications or the care you received while you were in the hospital after you are discharged, you can call the unit and asked to speak with the hospitalist on call if the hospitalist that took care of you is not available. Once you are discharged, your primary care physician will handle any further medical issues. Please note that NO REFILLS for any discharge medications will be authorized once you are  discharged, as it is imperative that you return to your primary care physician (or establish a relationship with a primary care physician if you do not have one) for your aftercare needs so that they can reassess your need for medications and monitor your lab values.    Allergies as of 12/03/2020      Reactions   Digoxin Other (See Comments)   gynecomastia   Phencyclidine Other (See Comments)   PCP derived antibiotic > Insomnia   Spironolactone Other (See Comments)   Gynecomastia   Oxybutynin Chloride    Other reaction(s): low BP   Lactose Intolerance (gi) Diarrhea, Other (See Comments)   cramps   Penicillins Other (See Comments)   Cramps in stomach Did it involve swelling of the face/tongue/throat, SOB, or low BP? No Did it involve sudden or severe rash/hives, skin peeling, or any reaction on the inside of your mouth or nose?  No Did you need to seek medical attention at a hospital or doctor's office? No When did it last happen?unknown If all above answers are "NO", may proceed with cephalosporin use.   Sulfur Rash      Medication List    TAKE these medications   acetaminophen 650 MG CR tablet Commonly known as: TYLENOL Take 1,300 mg by mouth at bedtime.   allopurinol 100 MG tablet Commonly known as: ZYLOPRIM Take 200 mg by mouth daily.   Azelastine-Fluticasone 137-50 MCG/ACT Susp Place 1 spray into both nostrils 2 (two) times daily.   Calcium Carbonate-Vitamin D3 600-400 MG-UNIT Tabs Take 1 tablet by mouth 2 (two) times a day.   cefdinir 300 MG capsule Commonly known as: OMNICEF Take 1 capsule (300 mg total) by mouth 2 (two) times daily for 7 days.   cetirizine 10 MG tablet Commonly known as: ZYRTEC Take 10 mg by mouth at bedtime.   Envarsus XR 0.75 MG Tb24 Generic drug: Tacrolimus ER Take 2 tablets by mouth daily.   levothyroxine 125 MCG tablet Commonly known as: SYNTHROID Take 125 mcg by mouth daily.   NON FORMULARY Apply 1 application topically  daily. [facility] Anti-fungal (Nail)  (3) Refills *added the urea 40% to the anti-fungal (nail) -#1   predniSONE 2.5 MG tablet Commonly known as: DELTASONE Take 1 tablet (2.5 mg total) by mouth daily. What changed:   how much to take  when to take this   RABEprazole 20 MG tablet Commonly known as: Aciphex Take 2 tablets (40 mg total) by mouth 2 (two) times daily before a meal. What changed:   how much to take  when to take this   saccharomyces boulardii 250 MG capsule Commonly known as: FLORASTOR Take 1 capsule (250 mg total) by mouth 2 (two) times daily for 7 days.   traMADol 50 MG tablet Commonly known as: Ultram Take 1 tablet (50 mg total) by mouth every 6 (six) hours as needed for up to 7 days.   trimethoprim 100 MG tablet Commonly known as: TRIMPEX TAKE 1 TABLET BY MOUTH EVERY DAY   trimethoprim 100 MG tablet Commonly known as: TRIMPEX TAKE 1 TABLET BY MOUTH EVERY DAY       Time coordinating discharge: 35 minutes  The results of significant diagnostics from this hospitalization (including imaging, microbiology, ancillary and laboratory) are listed below for reference.    Procedures and Diagnostic Studies:   DG Chest 1 View  Result Date: 11/29/2020 CLINICAL DATA:  Hypoxia EXAM: CHEST  1 VIEW COMPARISON:  08/03/2020 FINDINGS: Since the prior examination, there has developed right basilar consolidation, possibly infectious in the appropriate clinical setting. Mild focal infiltrate is also noted within the right apex. Left lung is clear. No pneumothorax or pleural effusion. Median sternotomy has been performed. Cardiac size within normal limits. Multiple healed right rib fractures are noted. Multiple surgical clips are seen within the right axilla. IMPRESSION: Interval development of multifocal pulmonary infiltrate within the right lung, more focal within the right lung base, suspicious for atypical infection in the acute setting. Electronically Signed    By: Fidela Salisbury MD   On: 11/29/2020 22:27   DG C-Arm 1-60 Min-No Report  Result Date: 11/29/2020 Fluoroscopy was utilized by the requesting physician.  No radiographic interpretation.   ECHOCARDIOGRAM COMPLETE  Result Date: 11/30/2020    ECHOCARDIOGRAM REPORT   Patient Name:   Glenn Gonzalez Strom Date of Exam: 11/30/2020 Medical Rec #:  [id number]  Height:       74.0 in Accession #:    [id number]        Weight:       221.0 lb Date of Birth:  1961/04/09        BSA:          2.268 m Patient Age:    33 years          BP:           103/59 mmHg Patient Gender: M                 HR:           94 bpm. Exam Location:  Inpatient Procedure: 2D Echo, Cardiac Doppler, Color Doppler and Intracardiac            Opacification Agent Indications:    Dyspnea R06.00  History:        Patient has prior history of Echocardiogram examinations, most                 recent 05/12/2017. Arrythmias:Atrial Fibrillation; Risk                 Factors:Non-Smoker. GERD. 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  Sonographer:    Vickie Epley RDCS Referring Phys: [id number] [facility]  Sonographer Comments: Suboptimal apical window and suboptimal subcostal window. Pt unable to turn on side due to herniated discs. IMPRESSIONS  1. Left ventricular ejection fraction, by estimation, is 60 to 65%. The left ventricle has normal function. The left ventricle has no regional wall motion abnormalities. Left ventricular diastolic parameters were normal.  2. Right ventricular systolic function is normal. The right ventricular size is normal. There is normal pulmonary artery systolic pressure. The estimated right ventricular systolic pressure is 47.8 mmHg.  3. The mitral valve is normal in structure. No evidence of mitral valve regurgitation. No evidence of mitral stenosis.  4. The aortic valve is normal in structure. Aortic valve regurgitation is not visualized. No aortic stenosis is present.  5. The inferior vena cava is normal in size with greater than  50% respiratory variability, suggesting right atrial pressure of 3 mmHg. FINDINGS  Left Ventricle: Left ventricular ejection fraction, by estimation, is 60 to 65%. The left ventricle has normal function. The left ventricle has no regional wall motion abnormalities. Definity contrast agent was given IV to delineate the left ventricular  endocardial borders. The left ventricular internal cavity size was normal in size. There is no left ventricular hypertrophy. Left ventricular diastolic parameters were normal. Normal left ventricular filling pressure. Right Ventricle: The right ventricular size is normal. No increase in right ventricular wall thickness. Right ventricular systolic function is normal. There is normal pulmonary artery systolic pressure. The tricuspid regurgitant velocity is 2.39 m/s, and  with an assumed right atrial pressure of 3 mmHg, the estimated right ventricular systolic pressure is 29.5 mmHg. Left Atrium: Left atrial size was normal in size. Right Atrium: Right atrial size was normal in size. Pericardium: There is no evidence of pericardial effusion. Mitral Valve: The mitral valve is normal in structure. No evidence of mitral valve regurgitation. No evidence of mitral valve stenosis. Tricuspid Valve: The tricuspid valve is normal in structure. Tricuspid valve regurgitation is mild . No evidence of tricuspid stenosis. Aortic Valve: The aortic valve is normal in structure. Aortic valve regurgitation is not visualized. No aortic stenosis is present. Pulmonic Valve: The pulmonic valve was normal in structure. Pulmonic valve regurgitation is not visualized. No evidence of pulmonic  stenosis. Aorta: The aortic root is normal in size and structure. Venous: The inferior vena cava is normal in size with greater than 50% respiratory variability, suggesting right atrial pressure of 3 mmHg. IAS/Shunts: No atrial level shunt detected by color flow Doppler.  LEFT VENTRICLE PLAX 2D LVIDd:         4.40 cm      Diastology LVIDs:         3.20 cm     LV e' medial:    10.00 cm/s LV PW:         0.90 cm     LV E/e' medial:  7.5 LV IVS:        0.90 cm     LV e' lateral:   11.30 cm/s LVOT diam:     2.50 cm     LV E/e' lateral: 6.7 LV SV:         48 LV SV Index:   21 LVOT Area:     4.91 cm  LV Volumes (MOD) LV vol d, MOD A4C: 80.2 ml LV vol s, MOD A4C: 30.4 ml LV SV MOD A4C:     80.2 ml LEFT ATRIUM           Index LA diam:      2.70 cm 1.19 cm/m LA Vol (A4C): 16.3 ml 7.19 ml/m  AORTIC VALVE LVOT Vmax:   50.20 cm/s LVOT Vmean:  36.700 cm/s LVOT VTI:    0.099 m  AORTA Ao Root diam: 3.40 cm MITRAL VALVE               TRICUSPID VALVE MV Area (PHT): 5.38 cm    TR Peak grad:   22.8 mmHg MV Decel Time: 141 msec    TR Vmax:        239.00 cm/s MV E velocity: 75.40 cm/s MV A velocity: 36.00 cm/s  SHUNTS MV E/A ratio:  2.09        Systemic VTI:  0.10 m                            Systemic Diam: 2.50 cm Glenn Croitoru MD Electronically signed by Glenn Klein MD Signature Date/Time: 11/30/2020/12:30:40 PM    Final      Labs:   Basic Metabolic Panel: Recent Labs  Lab 11/29/20 2250 11/30/20 0255 12/18/2020 0639 12/02/20 0522 12/03/20 0520  NA 125* 127* 130* 132* 130*  K 4.1 4.1 4.3 3.8 3.7  CL 91* 92* 97* 97* 93*  CO2 _0 21*  GLUCOSE 122* 116* 120* 102* 105*  BUN 32* 30* 32* 24* 22*  CREATININE 2.29* 2.46* 2.34* 1.97* 1.87*  CALCIUM 8.2* 8.2* 8.5* 8.6* 9.3  MG  --   --   --  1.7 1.5*  PHOS  --   --   --   --  2.9   GFR Estimated Creatinine Clearance: 53.8 mL/min (A) (by C-G formula based on SCr of 1.87 mg/dL (H)). Liver Function Tests: Recent Labs  Lab 12/18/2020 0639  AST 20  ALT 10  ALKPHOS 61  BILITOT 0.6  PROT 5.8*  ALBUMIN 3.0*   No results for input(s): LIPASE, AMYLASE in the last 168 hours. No results for input(s): AMMONIA in the last 168 hours. Coagulation profile Recent Labs  Lab 11/30/20 0031  INR 1.0    CBC: Recent Labs  Lab 11/29/20 2250 11/30/20 0255 12/28/2020 0639  12/02/20 0522 12/03/20 0520  WBC 16.8* 22.4*  20.5* 19.8* 19.7*  NEUTROABS 15.7*  --   --  16.9* 16.9*  HGB 13.3 12.9* 12.8* 12.9* 14.4  HCT 39.6 39.5 38.5* 40.2 42.2  MCV 95.0 97.5 96.5 97.8 95.3  PLT 381 370 373 409* 460*   Cardiac Enzymes: No results for input(s): CKTOTAL, CKMB, CKMBINDEX, TROPONINI in the last 168 hours. BNP: Invalid input(s): POCBNP CBG: No results for input(s): GLUCAP in the last 168 hours. D-Dimer No results for input(s): DDIMER in the last 72 hours. Hgb A1c No results for input(s): HGBA1C in the last 72 hours. Lipid Profile No results for input(s): CHOL, HDL, LDLCALC, TRIG, CHOLHDL, LDLDIRECT in the last 72 hours. Thyroid function studies No results for input(s): TSH, T4TOTAL, T3FREE, THYROIDAB in the last 72 hours.  Invalid input(s): FREET3 Anemia work up [employer]    12/03/20 0520  FERRITIN 350*   Microbiology Recent Results (from the past 240 hour(s))  SARS Coronavirus 2 by RT PCR (hospital order, performed in [facility] hospital lab) Nasopharyngeal Nasopharyngeal Swab     Status: None   Collection Time: 11/29/20  2:45 PM   Specimen: Nasopharyngeal Swab  Result Value Ref Range Status   SARS Coronavirus 2 NEGATIVE NEGATIVE Final    Comment: (NOTE) SARS-CoV-2 target nucleic acids are NOT DETECTED.  The SARS-CoV-2 RNA is generally detectable in upper and lower respiratory specimens during the acute phase of infection. The lowest concentration of SARS-CoV-2 viral copies this assay can detect is 250 copies / mL. A negative result does not preclude SARS-CoV-2 infection and should not be used as the sole basis for treatment or other patient management decisions.  A negative result may occur with improper specimen collection / handling, submission of specimen other than nasopharyngeal swab, presence of viral mutation(s) within the areas targeted by this assay, and inadequate number of viral copies (<250 copies / mL). A negative result must be  combined with clinical observations, patient history, and epidemiological information.  Fact Sheet for Patients:   StrictlyIdeas.no  Fact Sheet for Healthcare Providers: BankingDealers.co.za  This test is not yet approved or  cleared by the [place] FDA and has been authorized for detection and/or diagnosis of SARS-CoV-2 by FDA under an Emergency Use Authorization (EUA).  This EUA will remain in effect (meaning this test can be used) for the duration of the COVID-19 declaration under Section 564(b)(1) of the Act, 21 U.S.C. section 360bbb-3(b)(1), unless the authorization is terminated or revoked sooner.  Performed at [facility], [place] [address], [place], [place] [address]   Urine Culture     Status: None   Collection Time: 11/29/20  6:35 PM   Specimen: PATH Other; Tissue  Result Value Ref Range Status   Specimen Description   Final    URINE, RANDOM Performed at [place] [address], [place], [place] [address]    Special Requests   Final    NONE Performed at [facility], [place] [address], [place], [place] [address]    Culture   Final    NO GROWTH Performed at [facility], [place] [address], [place], [place] [address]    Report Status 12/09/2020 FINAL  Final  Culture, blood (x 2)     Status: None (Preliminary result)   Collection Time: 11/30/20 12:31 AM   Specimen: BLOOD  Result Value Ref Range Status   Specimen Description   Final    BLOOD RIGHT HAND Performed at [facility]  [address] [place], [place] [address]    Special Requests   Final    BOTTLES DRAWN AEROBIC ONLY Blood Culture adequate volume Performed at [place] [address] [place], [place] [address]    Culture   Final    NO GROWTH 3 DAYS Performed at [facility], [place] [address] [place], [place] [address]    Report Status  PENDING  Incomplete  Culture, blood (x 2)     Status: None (Preliminary result)   Collection Time: 11/30/20 12:31 AM   Specimen: BLOOD  Result Value Ref Range Status   Specimen Description   Final    BLOOD LEFT HAND Performed at [place] [address] [place] [address] [place], [place] [address]    Special Requests   Final    BOTTLES DRAWN AEROBIC ONLY Blood Culture adequate volume Performed at [place] [address], [place], [place] [address]    Culture   Final    NO GROWTH 3 DAYS Performed at [facility], [place] [address], [place], [place] [address]    Report Status PENDING  Incomplete  Resp Panel by RT-PCR (Flu A&B, Covid) Nasopharyngeal Swab     Status: None   Collection Time: 11/30/20  4:30 AM   Specimen: Nasopharyngeal Swab; Nasopharyngeal(NP) swabs in vial transport medium  Result Value Ref Range Status   SARS Coronavirus 2 by RT PCR NEGATIVE NEGATIVE Final    Comment: (NOTE) SARS-CoV-2 target nucleic acids are NOT DETECTED.  The SARS-CoV-2 RNA is generally detectable in upper respiratory specimens during the acute phase of infection. The lowest concentration of SARS-CoV-2 viral copies this assay can detect is 138 copies/mL. A negative result does not preclude SARS-Cov-2 infection and should not be used as the sole basis for treatment or other patient management decisions. A negative result may occur with  improper specimen collection/handling, submission of specimen other than nasopharyngeal swab, presence of viral mutation(s) within the areas targeted by this assay, and inadequate number of viral copies(<138 copies/mL). A negative result must be combined with clinical observations, patient history, and epidemiological information. The expected result is Negative.  Fact Sheet for Patients:  EntrepreneurPulse.com.au  Fact Sheet for Healthcare Providers:  IncredibleEmployment.be  This test is  no t yet approved or cleared by the [place] FDA and  has been authorized for detection and/or diagnosis of SARS-CoV-2 by FDA under an Emergency Use Authorization (EUA). This EUA will remain  in effect (meaning this test can be used) for the duration of the COVID-19 declaration under Section 564(b)(1) of the Act, 21 U.S.C.section 360bbb-3(b)(1), unless the authorization is terminated  or revoked sooner.       Influenza A by PCR NEGATIVE NEGATIVE Final   Influenza B by PCR NEGATIVE NEGATIVE Final    Comment: (NOTE) The Xpert Xpress SARS-CoV-2/FLU/RSV plus assay is intended as an aid in the diagnosis of influenza from Nasopharyngeal swab specimens and should not be used as a sole basis for treatment. Nasal washings and aspirates are unacceptable for Xpert Xpress SARS-CoV-2/FLU/RSV testing.  Fact Sheet for Patients: EntrepreneurPulse.com.au  Fact Sheet for Healthcare Providers: IncredibleEmployment.be  This test is not yet approved or cleared by the [place] FDA and has been authorized for detection and/or diagnosis of SARS-CoV-2 by FDA under an Emergency Use Authorization (EUA). This EUA will remain in effect (meaning this test can be used) for the duration of the COVID-19 declaration under Section 564(b)(1) of the Act, 21 U.S.C. section 360bbb-3(b)(1), unless  the authorization is terminated or revoked.  Performed at [facility], [place] [address], [place], [place] [address]   MRSA PCR Screening     Status: None   Collection Time: 11/30/20 10:22 AM   Specimen: Nasopharyngeal  Result Value Ref Range Status   MRSA by PCR NEGATIVE NEGATIVE Final    Comment:        The GeneXpert MRSA Assay (FDA approved for NASAL specimens only), is one component of a comprehensive MRSA colonization surveillance program. It is not intended to diagnose MRSA infection nor to guide or monitor treatment for MRSA  infections. Performed at [facility], [place] [address], [place], [place] [address]      Signed: Marlowe Aschoff Teria Khachatryan  [facility] 12/03/2020, 12:59 PM

## 2020-12-03 NOTE — Progress Notes (Addendum)
Patient verbalized that his home morning medications were taken at 10 am.  He stated that he took his own supplied medications that were at his bedside. See MAR.

## 2020-12-03 NOTE — Progress Notes (Signed)
Discussed with patient and spouse discharge instructions, they verbalized agreement and understanding.  Patient to leave in private vehicle with all belongings.   

## 2020-12-04 ENCOUNTER — Telehealth: Payer: Self-pay | Admitting: Hematology

## 2020-12-04 NOTE — Telephone Encounter (Signed)
Patient requested an earlier appointment than 4/1 due to concerning findings from surgery. Let patient know we will call him back to schedule an appointment with the provider's approval.

## 2020-12-05 ENCOUNTER — Inpatient Hospital Stay (HOSPITAL_COMMUNITY)
Admission: AD | Admit: 2020-12-05 | Discharge: 2021-01-29 | DRG: 673 | Disposition: E | Payer: 59 | Source: Ambulatory Visit | Attending: Family Medicine | Admitting: Family Medicine

## 2020-12-05 ENCOUNTER — Observation Stay (HOSPITAL_COMMUNITY): Payer: 59

## 2020-12-05 ENCOUNTER — Other Ambulatory Visit: Payer: Self-pay

## 2020-12-05 ENCOUNTER — Telehealth: Payer: Self-pay

## 2020-12-05 ENCOUNTER — Encounter (HOSPITAL_COMMUNITY): Payer: Self-pay | Admitting: Internal Medicine

## 2020-12-05 DIAGNOSIS — M545 Low back pain, unspecified: Secondary | ICD-10-CM

## 2020-12-05 DIAGNOSIS — M109 Gout, unspecified: Secondary | ICD-10-CM | POA: Diagnosis present

## 2020-12-05 DIAGNOSIS — G893 Neoplasm related pain (acute) (chronic): Secondary | ICD-10-CM | POA: Diagnosis present

## 2020-12-05 DIAGNOSIS — I82441 Acute embolism and thrombosis of right tibial vein: Secondary | ICD-10-CM | POA: Diagnosis not present

## 2020-12-05 DIAGNOSIS — E871 Hypo-osmolality and hyponatremia: Secondary | ICD-10-CM | POA: Diagnosis present

## 2020-12-05 DIAGNOSIS — I5043 Acute on chronic combined systolic (congestive) and diastolic (congestive) heart failure: Secondary | ICD-10-CM | POA: Diagnosis not present

## 2020-12-05 DIAGNOSIS — Z5111 Encounter for antineoplastic chemotherapy: Secondary | ICD-10-CM

## 2020-12-05 DIAGNOSIS — Z85828 Personal history of other malignant neoplasm of skin: Secondary | ICD-10-CM

## 2020-12-05 DIAGNOSIS — N170 Acute kidney failure with tubular necrosis: Secondary | ICD-10-CM | POA: Diagnosis not present

## 2020-12-05 DIAGNOSIS — R112 Nausea with vomiting, unspecified: Secondary | ICD-10-CM | POA: Diagnosis not present

## 2020-12-05 DIAGNOSIS — T83122A Displacement of urinary stent, initial encounter: Secondary | ICD-10-CM

## 2020-12-05 DIAGNOSIS — Z20822 Contact with and (suspected) exposure to covid-19: Secondary | ICD-10-CM | POA: Diagnosis present

## 2020-12-05 DIAGNOSIS — N1330 Unspecified hydronephrosis: Secondary | ICD-10-CM | POA: Diagnosis present

## 2020-12-05 DIAGNOSIS — E8809 Other disorders of plasma-protein metabolism, not elsewhere classified: Secondary | ICD-10-CM | POA: Diagnosis not present

## 2020-12-05 DIAGNOSIS — K5903 Drug induced constipation: Secondary | ICD-10-CM | POA: Diagnosis present

## 2020-12-05 DIAGNOSIS — M549 Dorsalgia, unspecified: Secondary | ICD-10-CM | POA: Diagnosis present

## 2020-12-05 DIAGNOSIS — C679 Malignant neoplasm of bladder, unspecified: Principal | ICD-10-CM | POA: Diagnosis present

## 2020-12-05 DIAGNOSIS — Z888 Allergy status to other drugs, medicaments and biological substances status: Secondary | ICD-10-CM

## 2020-12-05 DIAGNOSIS — R0902 Hypoxemia: Secondary | ICD-10-CM | POA: Diagnosis not present

## 2020-12-05 DIAGNOSIS — Z515 Encounter for palliative care: Secondary | ICD-10-CM

## 2020-12-05 DIAGNOSIS — C7910 Secondary malignant neoplasm of unspecified urinary organs: Secondary | ICD-10-CM

## 2020-12-05 DIAGNOSIS — R188 Other ascites: Secondary | ICD-10-CM | POA: Diagnosis present

## 2020-12-05 DIAGNOSIS — D75839 Thrombocytosis, unspecified: Secondary | ICD-10-CM | POA: Diagnosis not present

## 2020-12-05 DIAGNOSIS — R140 Abdominal distension (gaseous): Secondary | ICD-10-CM

## 2020-12-05 DIAGNOSIS — Z85038 Personal history of other malignant neoplasm of large intestine: Secondary | ICD-10-CM

## 2020-12-05 DIAGNOSIS — K219 Gastro-esophageal reflux disease without esophagitis: Secondary | ICD-10-CM | POA: Diagnosis present

## 2020-12-05 DIAGNOSIS — E875 Hyperkalemia: Secondary | ICD-10-CM | POA: Diagnosis not present

## 2020-12-05 DIAGNOSIS — E039 Hypothyroidism, unspecified: Secondary | ICD-10-CM | POA: Diagnosis present

## 2020-12-05 DIAGNOSIS — Z8249 Family history of ischemic heart disease and other diseases of the circulatory system: Secondary | ICD-10-CM

## 2020-12-05 DIAGNOSIS — Z79890 Hormone replacement therapy (postmenopausal): Secondary | ICD-10-CM

## 2020-12-05 DIAGNOSIS — Z66 Do not resuscitate: Secondary | ICD-10-CM | POA: Diagnosis not present

## 2020-12-05 DIAGNOSIS — N183 Chronic kidney disease, stage 3 unspecified: Secondary | ICD-10-CM

## 2020-12-05 DIAGNOSIS — C787 Secondary malignant neoplasm of liver and intrahepatic bile duct: Secondary | ICD-10-CM | POA: Diagnosis present

## 2020-12-05 DIAGNOSIS — Z7952 Long term (current) use of systemic steroids: Secondary | ICD-10-CM

## 2020-12-05 DIAGNOSIS — D72829 Elevated white blood cell count, unspecified: Secondary | ICD-10-CM | POA: Diagnosis not present

## 2020-12-05 DIAGNOSIS — I313 Pericardial effusion (noninflammatory): Secondary | ICD-10-CM | POA: Diagnosis not present

## 2020-12-05 DIAGNOSIS — C801 Malignant (primary) neoplasm, unspecified: Secondary | ICD-10-CM

## 2020-12-05 DIAGNOSIS — Z880 Allergy status to penicillin: Secondary | ICD-10-CM

## 2020-12-05 DIAGNOSIS — R630 Anorexia: Secondary | ICD-10-CM

## 2020-12-05 DIAGNOSIS — E861 Hypovolemia: Secondary | ICD-10-CM | POA: Diagnosis not present

## 2020-12-05 DIAGNOSIS — E89811 Postprocedural hemorrhage and hematoma of an endocrine system organ or structure following other procedure: Secondary | ICD-10-CM | POA: Diagnosis not present

## 2020-12-05 DIAGNOSIS — Z941 Heart transplant status: Secondary | ICD-10-CM

## 2020-12-05 DIAGNOSIS — L8990 Pressure ulcer of unspecified site, unspecified stage: Secondary | ICD-10-CM | POA: Diagnosis not present

## 2020-12-05 DIAGNOSIS — Z923 Personal history of irradiation: Secondary | ICD-10-CM

## 2020-12-05 DIAGNOSIS — J189 Pneumonia, unspecified organism: Secondary | ICD-10-CM | POA: Diagnosis present

## 2020-12-05 DIAGNOSIS — N179 Acute kidney failure, unspecified: Secondary | ICD-10-CM

## 2020-12-05 DIAGNOSIS — Z881 Allergy status to other antibiotic agents status: Secondary | ICD-10-CM

## 2020-12-05 DIAGNOSIS — K769 Liver disease, unspecified: Secondary | ICD-10-CM

## 2020-12-05 DIAGNOSIS — T40605A Adverse effect of unspecified narcotics, initial encounter: Secondary | ICD-10-CM | POA: Diagnosis present

## 2020-12-05 DIAGNOSIS — R58 Hemorrhage, not elsewhere classified: Secondary | ICD-10-CM

## 2020-12-05 DIAGNOSIS — Z8571 Personal history of Hodgkin lymphoma: Secondary | ICD-10-CM

## 2020-12-05 DIAGNOSIS — I5022 Chronic systolic (congestive) heart failure: Secondary | ICD-10-CM | POA: Diagnosis present

## 2020-12-05 DIAGNOSIS — R590 Localized enlarged lymph nodes: Secondary | ICD-10-CM

## 2020-12-05 DIAGNOSIS — D631 Anemia in chronic kidney disease: Secondary | ICD-10-CM | POA: Diagnosis present

## 2020-12-05 DIAGNOSIS — N186 End stage renal disease: Secondary | ICD-10-CM | POA: Diagnosis not present

## 2020-12-05 DIAGNOSIS — Y848 Other medical procedures as the cause of abnormal reaction of the patient, or of later complication, without mention of misadventure at the time of the procedure: Secondary | ICD-10-CM | POA: Diagnosis not present

## 2020-12-05 DIAGNOSIS — Z79899 Other long term (current) drug therapy: Secondary | ICD-10-CM

## 2020-12-05 DIAGNOSIS — Z818 Family history of other mental and behavioral disorders: Secondary | ICD-10-CM

## 2020-12-05 DIAGNOSIS — Z9221 Personal history of antineoplastic chemotherapy: Secondary | ICD-10-CM

## 2020-12-05 DIAGNOSIS — Z6827 Body mass index (BMI) 27.0-27.9, adult: Secondary | ICD-10-CM

## 2020-12-05 DIAGNOSIS — R339 Retention of urine, unspecified: Secondary | ICD-10-CM

## 2020-12-05 DIAGNOSIS — R310 Gross hematuria: Secondary | ICD-10-CM | POA: Diagnosis not present

## 2020-12-05 DIAGNOSIS — C786 Secondary malignant neoplasm of retroperitoneum and peritoneum: Secondary | ICD-10-CM | POA: Diagnosis present

## 2020-12-05 DIAGNOSIS — E440 Moderate protein-calorie malnutrition: Secondary | ICD-10-CM | POA: Diagnosis not present

## 2020-12-05 LAB — CBC WITH DIFFERENTIAL/PLATELET
Abs Immature Granulocytes: 0.26 10*3/uL — ABNORMAL HIGH (ref 0.00–0.07)
Basophils Absolute: 0.1 10*3/uL (ref 0.0–0.1)
Basophils Relative: 0 %
Eosinophils Absolute: 0 10*3/uL (ref 0.0–0.5)
Eosinophils Relative: 0 %
HCT: 42 % (ref 39.0–52.0)
Hemoglobin: 14.1 g/dL (ref 13.0–17.0)
Immature Granulocytes: 1 %
Lymphocytes Relative: 4 %
Lymphs Abs: 0.8 10*3/uL (ref 0.7–4.0)
MCH: 31.5 pg (ref 26.0–34.0)
MCHC: 33.6 g/dL (ref 30.0–36.0)
MCV: 94 fL (ref 80.0–100.0)
Monocytes Absolute: 2 10*3/uL — ABNORMAL HIGH (ref 0.1–1.0)
Monocytes Relative: 10 %
Neutro Abs: 16.6 10*3/uL — ABNORMAL HIGH (ref 1.7–7.7)
Neutrophils Relative %: 85 %
Platelets: 418 10*3/uL — ABNORMAL HIGH (ref 150–400)
RBC: 4.47 MIL/uL (ref 4.22–5.81)
RDW: 14.7 % (ref 11.5–15.5)
WBC: 19.7 10*3/uL — ABNORMAL HIGH (ref 4.0–10.5)
nRBC: 0.1 % (ref 0.0–0.2)

## 2020-12-05 LAB — MAGNESIUM: Magnesium: 1.7 mg/dL (ref 1.7–2.4)

## 2020-12-05 LAB — URINALYSIS, COMPLETE (UACMP) WITH MICROSCOPIC
Bilirubin Urine: NEGATIVE
Glucose, UA: NEGATIVE mg/dL
Ketones, ur: 5 mg/dL — AB
Nitrite: NEGATIVE
Protein, ur: 30 mg/dL — AB
Specific Gravity, Urine: 1.008 (ref 1.005–1.030)
pH: 5 (ref 5.0–8.0)

## 2020-12-05 LAB — COMPREHENSIVE METABOLIC PANEL
ALT: 12 U/L (ref 0–44)
AST: 22 U/L (ref 15–41)
Albumin: 3.2 g/dL — ABNORMAL LOW (ref 3.5–5.0)
Alkaline Phosphatase: 58 U/L (ref 38–126)
Anion gap: 12 (ref 5–15)
BUN: 32 mg/dL — ABNORMAL HIGH (ref 6–20)
CO2: 25 mmol/L (ref 22–32)
Calcium: 9.2 mg/dL (ref 8.9–10.3)
Chloride: 82 mmol/L — ABNORMAL LOW (ref 98–111)
Creatinine, Ser: 1.81 mg/dL — ABNORMAL HIGH (ref 0.61–1.24)
GFR, Estimated: 43 mL/min — ABNORMAL LOW (ref >60)
Glucose, Bld: 106 mg/dL — ABNORMAL HIGH (ref 70–99)
Potassium: 4.2 mmol/L (ref 3.5–5.1)
Sodium: 119 mmol/L — CL (ref 135–145)
Total Bilirubin: 0.7 mg/dL (ref 0.3–1.2)
Total Protein: 6.4 g/dL — ABNORMAL LOW (ref 6.5–8.1)

## 2020-12-05 LAB — SARS CORONAVIRUS 2 (TAT 6-24 HRS): SARS Coronavirus 2: NEGATIVE

## 2020-12-05 LAB — CULTURE, BLOOD (ROUTINE X 2)
Culture: NO GROWTH
Culture: NO GROWTH
Special Requests: ADEQUATE
Special Requests: ADEQUATE

## 2020-12-05 MED ORDER — AZELASTINE-FLUTICASONE 137-50 MCG/ACT NA SUSP: 1.0000 | Freq: Every day | NASAL | Status: DC

## 2020-12-05 MED ORDER — FAMOTIDINE 20 MG PO TABS
20.0000 mg | ORAL_TABLET | Freq: Two times a day (BID) | ORAL | Status: DC
  Administered 2020-12-07 – 2020-12-08 (×2): 20 mg via ORAL
  Filled 2020-12-05 (×3): qty 1

## 2020-12-05 MED ORDER — MORPHINE SULFATE (PF) 2 MG/ML IV SOLN: 2.0000 mg | INTRAVENOUS | Status: DC | PRN

## 2020-12-05 MED ORDER — TRAZODONE HCL 100 MG PO TABS
100.0000 mg | ORAL_TABLET | Freq: Every day | ORAL | Status: DC
  Administered 2020-12-05 – 2020-12-28 (×24): 100 mg via ORAL
  Filled 2020-12-05 (×2): qty 1
  Filled 2020-12-05: qty 2
  Filled 2020-12-05: qty 1
  Filled 2020-12-05: qty 2
  Filled 2020-12-05 (×2): qty 1
  Filled 2020-12-05: qty 2
  Filled 2020-12-05 (×2): qty 1
  Filled 2020-12-05: qty 2
  Filled 2020-12-05 (×14): qty 1
  Filled 2020-12-05: qty 2

## 2020-12-05 MED ORDER — AZELASTINE HCL 0.1 % NA SOLN
1.0000 | Freq: Every day | NASAL | Status: DC
  Administered 2020-12-05 – 2020-12-31 (×19): 1 via NASAL
  Filled 2020-12-05: qty 30

## 2020-12-05 MED ORDER — TACROLIMUS ER 0.75 MG PO TB24
1.5000 mg | ORAL_TABLET | Freq: Every day | ORAL | Status: DC
  Administered 2020-12-08 – 2021-01-01 (×22): 1.5 mg via ORAL
  Filled 2020-12-05 (×17): qty 2

## 2020-12-05 MED ORDER — ACETAMINOPHEN 650 MG RE SUPP: 650.0000 mg | Freq: Four times a day (QID) | RECTAL | Status: DC | PRN

## 2020-12-05 MED ORDER — OXYCODONE HCL 5 MG PO TABS: 5.0000 mg | ORAL_TABLET | ORAL | Status: DC | PRN

## 2020-12-05 MED ORDER — PANTOPRAZOLE SODIUM 40 MG PO TBEC: 40.0000 mg | DELAYED_RELEASE_TABLET | Freq: Every day | ORAL | Status: DC

## 2020-12-05 MED ORDER — LORATADINE 10 MG PO TABS
10.0000 mg | ORAL_TABLET | Freq: Every day | ORAL | Status: DC
  Administered 2020-12-08 – 2020-12-14 (×5): 10 mg via ORAL
  Filled 2020-12-05 (×10): qty 1

## 2020-12-05 MED ORDER — PREDNISONE 5 MG PO TABS
5.0000 mg | ORAL_TABLET | Freq: Every day | ORAL | Status: DC
Start: 2020-12-06 — End: 2020-12-12
  Administered 2020-12-08 – 2020-12-11 (×4): 5 mg via ORAL
  Filled 2020-12-05 (×4): qty 1

## 2020-12-05 MED ORDER — ACETAMINOPHEN 325 MG PO TABS
650.0000 mg | ORAL_TABLET | Freq: Four times a day (QID) | ORAL | Status: DC | PRN
  Filled 2020-12-05: qty 2

## 2020-12-05 MED ORDER — ONDANSETRON HCL 4 MG/2ML IJ SOLN: 4.0000 mg | Freq: Four times a day (QID) | INTRAMUSCULAR | Status: DC | PRN

## 2020-12-05 MED ORDER — FLUTICASONE PROPIONATE 50 MCG/ACT NA SUSP
1.0000 | Freq: Every day | NASAL | Status: DC
  Administered 2020-12-05 – 2020-12-31 (×19): 1 via NASAL
  Filled 2020-12-05: qty 16

## 2020-12-05 MED ORDER — CEFDINIR 300 MG PO CAPS
300.0000 mg | ORAL_CAPSULE | Freq: Two times a day (BID) | ORAL | Status: AC
  Administered 2020-12-07 – 2020-12-10 (×6): 300 mg via ORAL
  Filled 2020-12-05 (×10): qty 1

## 2020-12-05 MED ORDER — CALCIUM CARBONATE ANTACID 500 MG PO CHEW
1.0000 | CHEWABLE_TABLET | Freq: Three times a day (TID) | ORAL | Status: DC | PRN
  Filled 2020-12-05: qty 1

## 2020-12-05 MED ORDER — ALLOPURINOL 100 MG PO TABS
200.0000 mg | ORAL_TABLET | Freq: Every day | ORAL | Status: DC
  Administered 2020-12-08 – 2021-01-01 (×23): 200 mg via ORAL
  Filled 2020-12-05 (×23): qty 2

## 2020-12-05 MED ORDER — ONDANSETRON HCL 4 MG PO TABS
4.0000 mg | ORAL_TABLET | Freq: Four times a day (QID) | ORAL | Status: DC | PRN
  Administered 2020-12-14: 4 mg via ORAL
  Filled 2020-12-05: qty 1

## 2020-12-05 MED ORDER — MAGNESIUM HYDROXIDE 400 MG/5ML PO SUSP: 30.0000 mL | Freq: Every day | ORAL | Status: DC | PRN

## 2020-12-05 MED ORDER — SODIUM CHLORIDE 0.9 % IV SOLN: INTRAVENOUS | Status: DC

## 2020-12-05 MED ORDER — DOCUSATE SODIUM 100 MG PO CAPS
100.0000 mg | ORAL_CAPSULE | Freq: Two times a day (BID) | ORAL | Status: DC
  Administered 2020-12-05 – 2020-12-15 (×20): 100 mg via ORAL
  Filled 2020-12-05 (×20): qty 1

## 2020-12-05 MED ORDER — LEVOTHYROXINE SODIUM 25 MCG PO TABS
125.0000 ug | ORAL_TABLET | Freq: Every day | ORAL | Status: DC
  Administered 2020-12-08 – 2021-01-01 (×23): 125 ug via ORAL
  Filled 2020-12-05 (×25): qty 1

## 2020-12-05 NOTE — Progress Notes (Signed)
Patient presented with HS med. Refuses Omicef 300mg , pepcid, and Claritin. Took his  home med omicef 300mg , zantac, and rabeprazole. Michela Pitcher he has been taking his own brand of med for  Longtime and is  the reason he wants his med at bedside and not send to pharmacy. Michela Pitcher he was allowed to do so on his past admission. NP on call made made aware of pt choice to keep his home med at bedside.

## 2020-12-05 NOTE — Progress Notes (Signed)
Patient does not want his medication taken to pharmacy, he wants to keep it in the room.

## 2020-12-05 NOTE — Progress Notes (Signed)
CRITICAL VALUE ALERT  Critical Value:  NA 119  Date & Time Notied:  12/29/2020@2014   Provider Notified: yes  Orders Received/Actions taken:

## 2020-12-05 NOTE — Telephone Encounter (Signed)
TCT to [facility] on [phone] and LVM for CT results to be faxed over stat.

## 2020-12-05 NOTE — H&P (Addendum)
History and Physical    Glenn Gonzalez GYI:[id number] DOB: 04-25-1961 DOA: 12/18/2020  PCP: Lajean Manes, MD  Patient coming from: Urology office  Chief Complaint: back pain  HPI: Glenn Gonzalez is a 60 y.o. male with medical history significant of hydronephrosis, hodgkin's lymphoma, bladder cancer. Presenting with back pain. Recently admitted for hydronephrosis and back pain. At the time, he had a stent placed and imaging found new retroperitoneal lymphadenopathy. He was told to follow up with oncology for repeat scans in a few weeks with possible IR intervention coming after. He reported to the Urology office today with increased back pain. It was sharp and on the right side. It was unrelieved by tramadol or hydrocodone. He was seen in clinic and noted to have his stents in good position. It was recommended that he come to the hospital for further eval.    Review of Systems:  Denies CP, dyspnea. Reports N, poor PO intake, right side back pain. Review of systems is otherwise negative for all not mentioned in HPI.   PMHx Past Medical History:  Diagnosis Date  . A-fib ([place])    IN PAST with old heart   . Bladder tumor   . Bulging lumbar disc    states " i threw my back out on tuesday 6-16, i ahve a bulging disc, but i can lie on my back a side "   . Chronic fatigue   . Chronic kidney disease    CREATININE NOW 1.5 stage 1 ckd  . Colon cancer ([place]) DX 2019   SURGERY DONE  . Complication of anesthesia    AFTER HEART TRANSPLANT TOOK 2 YEARS TO GET BACK TO SELF; patient does not want aneshesia for endoscopy procedures   . Complication of anesthesia    likes propofol wants as few of anesthesia drugs as possible  . Depression    SITUATIONAL  . GERD (gastroesophageal reflux disease)   . Gout yrs ago  . History of chemotherapy 1982 for hodgkins   DAMAGED HEART MUSCLE WITH RADIATION ALSO  . History of kidney stones   . History of radiation therapy 1982   for hodgkins lymphoma  .  Hodgkin's lymphoma ([place]) 1980   IIIB. 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  . Hypothyroidism   . Pneumonia FROM CHF 2016   recurrent pneumonia sedf.  rad tx for lymphoma  . Skin abnormality    right chest small area with squamous cell area removed 3 weeks ago as of 07-27-2020  . Skin cancer    AREAS REMOVED FROM [facility], CHECK AND BACK AND HEAD SEPT 2019  . Status post heart transplantation ([place]) 2017   caused by chemo drug adriomycin    PSHx Past Surgical History:  Procedure Laterality Date  . BIOPSY  07/02/2018   Procedure: BIOPSY;  Surgeon: Carol Ada, MD;  [facility]: [facility];  Service: Endoscopy;;  . BIOPSY  05/20/2019   Procedure: BIOPSY;  Surgeon: Carol Ada, MD;  [facility]: [facility];  Service: Endoscopy;;  . CARDIAC CATHETERIZATION N/A 04/20/2015   Procedure: Right/Left Heart Cath and Coronary Angiography;  Surgeon: Jolaine Artist, MD;  Location: [facility];  Service: Cardiovascular;  Laterality: N/A;  . CARDIAC CATHETERIZATION  10-11-15   pt states routine heart cath to [facility] at [facility]   . CARDIAC CATHETERIZATION N/A 04/02/2016   Procedure: Right Heart Cath;  Surgeon: Jolaine Artist, MD;  Location: [facility];  Service: Cardiovascular;  Laterality: N/A;  .  CARDIAC CATHETERIZATION  JULY 2019 AT [facility]  . COLONOSCOPY N/A 07/02/2018   Procedure: COLONOSCOPY;  Surgeon: Carol Ada, MD;  [facility];  Service: Endoscopy;  Laterality: N/A;  . COLONOSCOPY N/A 08/05/2019   Procedure: COLONOSCOPY;  Surgeon: Carol Ada, MD;  [facility];  Service: Endoscopy;  Laterality: N/A;  . COLONOSCOPY WITH PROPOFOL N/A 08/03/2015   Procedure: COLONOSCOPY WITH PROPOFOL;  Surgeon: Carol Ada, MD;  [facility];  Service: Endoscopy;  Laterality: N/A;  wants to try to do procedure without sedation  . COLONOSCOPY WITH PROPOFOL N/A 08/10/2020   Procedure: COLONOSCOPY WITH PROPOFOL;  Surgeon: Carol Ada, MD;  [facility];  Service: Endoscopy;  Laterality: N/A;  . CYSTOSCOPY N/A 08/28/2020   Procedure: CYSTOSCOPY;  Surgeon: Remi Haggard, MD;  [facility];  Service: Urology;  Laterality: N/A;  . CYSTOSCOPY W/ RETROGRADES Bilateral 07/31/2020   Procedure: CYSTOSCOPY WITH RETROGRADE PYELOGRAM;  Surgeon: Remi Haggard, MD;  Location: [facility];  Service: Urology;  Laterality: Bilateral;  . CYSTOSCOPY W/ URETERAL STENT PLACEMENT Right 11/29/2020   Procedure: CYSTOSCOPY WITH RETROGRADE PYELOGRAM/URETERAL STENT PLACEMENT;  Surgeon: Festus Aloe, MD;  [facility];  Service: Urology;  Laterality: Right;  . CYSTOSCOPY WITH INSERTION OF UROLIFT N/A 10/18/2018   Procedure: CYSTOSCOPY WITH INSERTION OF UROLIFT;  Surgeon: Cleon Gustin, MD;  Location: [facility];  Service: Urology;  Laterality: N/A;  30 MINS  . CYSTOSCOPY WITH RETROGRADE PYELOGRAM, URETEROSCOPY AND STENT PLACEMENT Right 10/12/2015   Procedure: CYSTOSCOPY WITH RETROGRADE PYELOGRAM, URETEROSCOPY AND STENT PLACEMENT;  Surgeon: Cleon Gustin, MD;  [facility];  Service: Urology;  Laterality: Right;  . ESOPHAGOGASTRODUODENOSCOPY    . ESOPHAGOGASTRODUODENOSCOPY (EGD) WITH PROPOFOL N/A 09/29/2019   Procedure: ESOPHAGOGASTRODUODENOSCOPY (EGD) WITH PROPOFOL;  Surgeon: Carol Ada, MD;  [facility];  Service: Endoscopy;  Laterality: N/A;  . ESOPHAGOGASTRODUODENOSCOPY (EGD) WITH PROPOFOL N/A 08/10/2020   Procedure: ESOPHAGOGASTRODUODENOSCOPY (EGD) WITH PROPOFOL;  Surgeon: Carol Ada, MD;  [facility];  Service: Endoscopy;  Laterality: N/A;  . EXPLORATORY LAPAROTOMY  1982   staging laparotomy  . FLEXIBLE SIGMOIDOSCOPY N/A 05/20/2019   Procedure: FLEXIBLE SIGMOIDOSCOPY;  Surgeon: Carol Ada, MD;  [facility];  Service: Endoscopy;  Laterality: N/A;  . FLEXIBLE SIGMOIDOSCOPY N/A 04/20/2020   Procedure: FLEXIBLE SIGMOIDOSCOPY;  Surgeon: Carol Ada, MD;   [facility];  Service: Endoscopy;  Laterality: N/A;  . gynemastia Bilateral   . HEART TRANSPLANT  06/20/2016   at [facility]  . HOT HEMOSTASIS N/A 07/02/2018   Procedure: HOT HEMOSTASIS (ARGON PLASMA COAGULATION/BICAP);  Surgeon: Carol Ada, MD;  Location: [facility];  Service: Endoscopy;  Laterality: N/A;  . LUMBAR LAMINECTOMY/DECOMPRESSION MICRODISCECTOMY Right 01/04/2016   Procedure: Microdiscectomy Lumbar four Lumbar five right;  Surgeon: Eustace Moore, MD;  Location: [facility];  Service: Neurosurgery;  Laterality: Right;  . neck biopses     x5  . 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 POLYPECTOMY  07/02/2018   Procedure: POLYPECTOMY;  Surgeon: Carol Ada, MD;  [facility];  Service: Endoscopy;;  . POLYPECTOMY  08/05/2019   Procedure: POLYPECTOMY;  Surgeon: Carol Ada, MD;  [facility];  Service: Endoscopy;;  . POLYPECTOMY  04/20/2020   Procedure: POLYPECTOMY;  Surgeon: Carol Ada, MD;  [facility];  Service: Endoscopy;;  . POLYPECTOMY  08/10/2020   Procedure: POLYPECTOMY;  Surgeon: Carol Ada, MD;  [facility];  Service: Endoscopy;;  . PORTACATH PLACEMENT     06-29-15 inserted, now removed.  . SPLENECTOMY, TOTAL  1981  . STONE EXTRACTION WITH BASKET Right 10/12/2015   Procedure: STONE EXTRACTION WITH BASKET;  Surgeon: Cleon Gustin, MD;  [facility];  Service: Urology;  Laterality: Right;  . SUBMUCOSAL INJECTION  07/02/2018   Procedure: SUBMUCOSAL INJECTION;  Surgeon: Carol Ada, MD;  [facility];  Service: Endoscopy;;  . TONSILLECTOMY  as child  . TRANSURETHRAL RESECTION OF BLADDER TUMOR N/A 07/31/2020   Procedure: TRANSURETHRAL RESECTION OF BLADDER TUMOR (TURBT) FULGERATION;  Surgeon: Remi Haggard, MD;  [facility];  Service: Urology;  Laterality: N/A;  . TRANSURETHRAL RESECTION OF BLADDER TUMOR N/A 08/28/2020   Procedure:  TRANSURETHRAL RESECTION OF BLADDER TUMOR (TURBT);  Surgeon: Remi Haggard, MD;  [facility];  Service: Urology;  Laterality: N/A;  30 MINS  . VARICELE REPAIR  40 YRS AGO    SocHx  reports that he has never smoked. He has never used smokeless tobacco. He reports that he does not drink alcohol and does not use drugs.  Allergies  Allergen Reactions  . Digoxin Other (See Comments)    gynecomastia   . Phencyclidine Other (See Comments)    PCP derived antibiotic > Insomnia  . Spironolactone Other (See Comments)    Gynecomastia  . Oxybutynin Chloride     Other reaction(s): low BP  . Lactose Intolerance (Gi) Diarrhea and Other (See Comments)    cramps  . Penicillins Other (See Comments)    Cramps in stomach Did it involve swelling of the face/tongue/throat, SOB, or low BP? No Did it involve sudden or severe rash/hives, skin peeling, or any reaction on the inside of your mouth or nose? No Did you need to seek medical attention at a hospital or doctor's office? No When did it last happen?unknown If all above answers are "NO", may proceed with cephalosporin use.     . Sulfur Rash    FamHx Family History  Problem Relation Age of Onset  . Hyperthyroidism Mother   . Insomnia Mother   . Hypertension Father   . Depression Father   . Insomnia Father     Prior to Admission medications   Medication Sig Start Date End Date Taking? Authorizing Provider  acetaminophen (TYLENOL) 650 MG CR tablet Take 1,300 mg by mouth at bedtime.    [provider]  allopurinol (ZYLOPRIM) 100 MG tablet Take 200 mg by mouth daily.  01/04/18   [provider]  Azelastine-Fluticasone 137-50 MCG/ACT SUSP Place 1 spray into both nostrils 2 (two) times daily. 11/27/20   [provider]  Calcium Carb-Cholecalciferol (CALCIUM CARBONATE-VITAMIN D3) 600-400 MG-UNIT TABS Take 1 tablet by mouth 2 (two) times a day.     [provider]  cefdinir (OMNICEF) 300 MG capsule  Take 1 capsule (300 mg total) by mouth 2 (two) times daily for 7 days. 12/03/20 12/10/20  Terrilee Croak, MD  cetirizine (ZYRTEC) 10 MG tablet Take 10 mg by mouth at bedtime.     [provider]  ENVARSUS XR 0.75 MG TB24 Take 2 tablets by mouth daily. 08/07/20   [provider]  levothyroxine (SYNTHROID, LEVOTHROID) 125 MCG tablet Take 125 mcg by mouth daily.  01/09/16   [provider]  NON FORMULARY Apply 1 application topically daily. [facility] Anti-fungal (Nail)  (3) Refills *added the urea 40% to the anti-fungal (nail) -#1    [provider]  predniSONE (  DELTASONE) 2.5 MG tablet Take 1 tablet (2.5 mg total) by mouth daily. Patient taking differently: Take 5 mg by mouth daily. 02/25/18   Regalado, Belkys A, MD  RABEprazole (ACIPHEX) 20 MG tablet Take 2 tablets (40 mg total) by mouth 2 (two) times daily before a meal. 12/03/20 03/03/21  Dahal, Marlowe Aschoff, MD  saccharomyces boulardii (FLORASTOR) 250 MG capsule Take 1 capsule (250 mg total) by mouth 2 (two) times daily for 7 days. 12/03/20 12/10/20  Terrilee Croak, MD  traMADol (ULTRAM) 50 MG tablet Take 1 tablet (50 mg total) by mouth every 6 (six) hours as needed for up to 7 days. 12/03/20 12/10/20  Terrilee Croak, MD  trimethoprim (TRIMPEX) 100 MG tablet TAKE 1 TABLET BY MOUTH EVERY DAY Patient not taking: Reported on 11/30/2020 10/11/20   Cleon Gustin, MD  trimethoprim (TRIMPEX) 100 MG tablet TAKE 1 TABLET BY MOUTH EVERY DAY Patient not taking: Reported on 11/30/2020 10/11/20   Cleon Gustin, MD    Physical Exam: Vitals:   12/20/2020 1321 12/02/2020 1322  BP:  (!) 139/97  Pulse:  93  Resp:  16  Temp:  97.9 F (36.6 C)  TempSrc:  Oral  SpO2:  96%  Weight: 102.1 kg   Height: 6\' 2"  (1.88 m)     General: 60 y.o. male resting in bed in NAD Eyes: PERRL, normal sclera ENMT: Nares patent w/o discharge, orophaynx clear, dentition normal, ears w/o discharge/lesions/ulcers Neck: Supple, trachea  midline Cardiovascular: RRR, +S1, S2, no m/g/r, equal pulses throughout Respiratory: CTABL, no w/r/r, normal WOB GI: BS+, mild distention, NT, no masses noted, no organomegaly noted MSK: No e/c/c, Right flank TTP mild Skin: No rashes, bruises, ulcerations noted Neuro: A&O x 3, no focal deficits Psyc: Appropriate interaction and affect, calm/cooperative  Labs on Admission: I have personally reviewed following labs and imaging studies  CBC: Recent Labs  Lab 11/29/20 2250 11/30/20 0255 12/05/2020 0639 12/02/20 0522 12/03/20 0520  WBC 16.8* 22.4* 20.5* 19.8* 19.7*  NEUTROABS 15.7*  --   --  16.9* 16.9*  HGB 13.3 12.9* 12.8* 12.9* 14.4  HCT 39.6 39.5 38.5* 40.2 42.2  MCV 95.0 97.5 96.5 97.8 95.3  PLT 381 370 373 409* 191*   Basic Metabolic Panel: Recent Labs  Lab 11/29/20 2250 11/30/20 0255 12/28/2020 0639 12/02/20 0522 12/03/20 0520  NA 125* 127* 130* 132* 130*  K 4.1 4.1 4.3 3.8 3.7  CL 91* 92* 97* 97* 93*  CO2 22 24 23 23  21*  GLUCOSE 122* 116* 120* 102* 105*  BUN 32* 30* 32* 24* 22*  CREATININE 2.29* 2.46* 2.34* 1.97* 1.87*  CALCIUM 8.2* 8.2* 8.5* 8.6* 9.3  MG  --   --   --  1.7 1.5*  PHOS  --   --   --   --  2.9   GFR: Estimated Creatinine Clearance: 54.3 mL/min (A) (by C-G formula based on SCr of 1.87 mg/dL (H)). Liver Function Tests: Recent Labs  Lab 12/31/2020 0639  AST 20  ALT 10  ALKPHOS 61  BILITOT 0.6  PROT 5.8*  ALBUMIN 3.0*   No results for input(s): LIPASE, AMYLASE in the last 168 hours. No results for input(s): AMMONIA in the last 168 hours. Coagulation Profile: Recent Labs  Lab 11/30/20 0031  INR 1.0   Cardiac Enzymes: No results for input(s): CKTOTAL, CKMB, CKMBINDEX, TROPONINI in the last 168 hours. BNP (last 3 results) No results for input(s): PROBNP in the last 8760 hours. HbA1C: No results for input(s): HGBA1C in  the last 72 hours. CBG: No results for input(s): GLUCAP in the last 168 hours. Lipid Profile: No results for input(s):  CHOL, HDL, LDLCALC, TRIG, CHOLHDL, LDLDIRECT in the last 72 hours. Thyroid Function Tests: No results for input(s): TSH, T4TOTAL, FREET4, T3FREE, THYROIDAB in the last 72 hours. Anemia Panel: Recent Labs    12/03/20 0520  FERRITIN 350*   Urine analysis:    Component Value Date/Time   COLORURINE STRAW (A) 11/29/2020 1835   APPEARANCEUR HAZY (A) 11/29/2020 1835   LABSPEC 1.004 (L) 11/29/2020 1835   PHURINE 5.0 11/29/2020 [place] 11/29/2020 1835   HGBUR LARGE (A) 11/29/2020 1835   HGBUR negative 05/12/2008 0957   BILIRUBINUR NEGATIVE 11/29/2020 [place] 11/29/2020 [place] 11/29/2020 1835   UROBILINOGEN 0.2 07/31/2015 2311   NITRITE NEGATIVE 11/29/2020 1835   LEUKOCYTESUR SMALL (A) 11/29/2020 1835    Radiological Exams on Admission: No results found.  Assessment/Plan Back pain     - place in ob, med-surg     - CT ab/pelvis ordered     - pain control     - awaiting lab work  Anorexia N/V     - requesting food     - start on FLD     - zofran PRN, fluids   CKD2     - awaiting labs     - getting fluids  High-grade T1 bladder cancer Hx of Hodgkin's lymphoma     - followed by Dr. Irene Limbo; he has been notified and they will follow     - followed by urology     - continue follow up  Recently found retroperitoneal lymphadenopathy     - CT chest/ab/pelvis and CT biopsy ordered; IR on board  Hypothyroidism     - continue levothyroxine  Recent multifocal PNA     - recently discharged to home on omnicef which was to complete on 1/10. Continue omnicef  Hx of heart transplant     - continue home steroid and envarsus   DVT prophylaxis: SCDs  Code Status: FULL  Family Communication: With wife at bedside.   Consults called: Spoke with IR, Onco, Urology   Status is: Observation  The patient remains OBS appropriate and will d/c before 2 midnights.  Dispo: The patient is from: Home              Anticipated d/c is to:  Home              Anticipated d/c date is: 1 day              Patient currently is not medically stable to d/c.  Jonnie Finner DO [facility]  If 7PM-7AM, please contact night-coverage [email or web address]  12/06/2020, 2:49 PM

## 2020-12-06 ENCOUNTER — Encounter (HOSPITAL_COMMUNITY): Payer: Self-pay | Admitting: Internal Medicine

## 2020-12-06 ENCOUNTER — Observation Stay (HOSPITAL_COMMUNITY): Payer: 59

## 2020-12-06 DIAGNOSIS — E871 Hypo-osmolality and hyponatremia: Secondary | ICD-10-CM

## 2020-12-06 DIAGNOSIS — R140 Abdominal distension (gaseous): Secondary | ICD-10-CM | POA: Diagnosis not present

## 2020-12-06 DIAGNOSIS — Z941 Heart transplant status: Secondary | ICD-10-CM

## 2020-12-06 DIAGNOSIS — C8190 Hodgkin lymphoma, unspecified, unspecified site: Secondary | ICD-10-CM

## 2020-12-06 DIAGNOSIS — M549 Dorsalgia, unspecified: Secondary | ICD-10-CM | POA: Diagnosis not present

## 2020-12-06 DIAGNOSIS — N1330 Unspecified hydronephrosis: Secondary | ICD-10-CM

## 2020-12-06 DIAGNOSIS — R590 Localized enlarged lymph nodes: Secondary | ICD-10-CM | POA: Diagnosis not present

## 2020-12-06 DIAGNOSIS — K769 Liver disease, unspecified: Secondary | ICD-10-CM | POA: Diagnosis not present

## 2020-12-06 DIAGNOSIS — C679 Malignant neoplasm of bladder, unspecified: Principal | ICD-10-CM

## 2020-12-06 DIAGNOSIS — E039 Hypothyroidism, unspecified: Secondary | ICD-10-CM

## 2020-12-06 DIAGNOSIS — F32A Depression, unspecified: Secondary | ICD-10-CM

## 2020-12-06 LAB — COMPREHENSIVE METABOLIC PANEL
ALT: 11 U/L (ref 0–44)
AST: 21 U/L (ref 15–41)
Albumin: 3 g/dL — ABNORMAL LOW (ref 3.5–5.0)
Alkaline Phosphatase: 54 U/L (ref 38–126)
Anion gap: 14 (ref 5–15)
BUN: 30 mg/dL — ABNORMAL HIGH (ref 6–20)
CO2: 22 mmol/L (ref 22–32)
Calcium: 8.6 mg/dL — ABNORMAL LOW (ref 8.9–10.3)
Chloride: 85 mmol/L — ABNORMAL LOW (ref 98–111)
Creatinine, Ser: 1.87 mg/dL — ABNORMAL HIGH (ref 0.61–1.24)
GFR, Estimated: 41 mL/min — ABNORMAL LOW (ref >60)
Glucose, Bld: 81 mg/dL (ref 70–99)
Potassium: 3.7 mmol/L (ref 3.5–5.1)
Sodium: 121 mmol/L — ABNORMAL LOW (ref 135–145)
Total Bilirubin: 0.9 mg/dL (ref 0.3–1.2)
Total Protein: 5.9 g/dL — ABNORMAL LOW (ref 6.5–8.1)

## 2020-12-06 LAB — CBC
HCT: 39.5 % (ref 39.0–52.0)
HCT: 40.4 % (ref 39.0–52.0)
Hemoglobin: 13.5 g/dL (ref 13.0–17.0)
Hemoglobin: 13.6 g/dL (ref 13.0–17.0)
MCH: 31.4 pg (ref 26.0–34.0)
MCH: 31.7 pg (ref 26.0–34.0)
MCHC: 34.2 g/dL (ref 30.0–36.0)
MCHC: 33.7 g/dL (ref 30.0–36.0)
MCV: 91.9 fL (ref 80.0–100.0)
MCV: 94.2 fL (ref 80.0–100.0)
Platelets: 459 10*3/uL — ABNORMAL HIGH (ref 150–400)
Platelets: 446 10*3/uL — ABNORMAL HIGH (ref 150–400)
RBC: 4.3 MIL/uL (ref 4.22–5.81)
RBC: 4.29 MIL/uL (ref 4.22–5.81)
RDW: 14.8 % (ref 11.5–15.5)
RDW: 14.9 % (ref 11.5–15.5)
WBC: 18.1 10*3/uL — ABNORMAL HIGH (ref 4.0–10.5)
WBC: 18.4 10*3/uL — ABNORMAL HIGH (ref 4.0–10.5)
nRBC: 0.1 % (ref 0.0–0.2)
nRBC: 0.1 % (ref 0.0–0.2)

## 2020-12-06 LAB — BASIC METABOLIC PANEL
Anion gap: 13 (ref 5–15)
BUN: 32 mg/dL — ABNORMAL HIGH (ref 6–20)
CO2: 23 mmol/L (ref 22–32)
Calcium: 8.8 mg/dL — ABNORMAL LOW (ref 8.9–10.3)
Chloride: 88 mmol/L — ABNORMAL LOW (ref 98–111)
Creatinine, Ser: 2.01 mg/dL — ABNORMAL HIGH (ref 0.61–1.24)
GFR, Estimated: 38 mL/min — ABNORMAL LOW (ref >60)
Glucose, Bld: 93 mg/dL (ref 70–99)
Potassium: 4 mmol/L (ref 3.5–5.1)
Sodium: 124 mmol/L — ABNORMAL LOW (ref 135–145)

## 2020-12-06 LAB — TSH: TSH: 3.931 u[IU]/mL (ref 0.350–4.500)

## 2020-12-06 LAB — CORTISOL-AM, BLOOD: Cortisol - AM: 22.4 ug/dL (ref 6.7–22.6)

## 2020-12-06 LAB — OSMOLALITY: Osmolality: 262 mOsm/kg — ABNORMAL LOW (ref 275–295)

## 2020-12-06 LAB — OSMOLALITY, URINE: Osmolality, Ur: 229 mOsm/kg — ABNORMAL LOW (ref 300–900)

## 2020-12-06 MED ORDER — FENTANYL CITRATE (PF) 100 MCG/2ML IJ SOLN
INTRAMUSCULAR | Status: AC | PRN
  Administered 2020-12-06: 50 ug via INTRAVENOUS

## 2020-12-06 MED ORDER — MIDAZOLAM HCL 2 MG/2ML IJ SOLN
INTRAMUSCULAR | Status: AC
  Filled 2020-12-06: qty 4

## 2020-12-06 MED ORDER — LIDOCAINE-EPINEPHRINE 1 %-1:100000 IJ SOLN
INTRAMUSCULAR | Status: AC | PRN
  Administered 2020-12-06: 10 mL

## 2020-12-06 MED ORDER — FENTANYL CITRATE (PF) 100 MCG/2ML IJ SOLN
INTRAMUSCULAR | Status: AC
  Filled 2020-12-06: qty 4

## 2020-12-06 MED ORDER — MIDAZOLAM HCL 2 MG/2ML IJ SOLN
INTRAMUSCULAR | Status: AC | PRN
  Administered 2020-12-06 (×3): 1 mg via INTRAVENOUS

## 2020-12-06 NOTE — Progress Notes (Signed)
Patient back in the room, post biopsy, patient alert denies any distress, biopsy site clean/dry/intact, no sign of bleeding noted. Will continue to assess patient.

## 2020-12-06 NOTE — Progress Notes (Signed)
Biopsy dressing clean/dry/intact. Vitals WNL.

## 2020-12-06 NOTE — Procedures (Signed)
Interventional Radiology Procedure Note  Procedure: CT guided right retroperitoneal lymph node biopsy  Findings: Please refer to procedural dictation for full description. 18 ga core x 4.  Retroperitoneal hemorrhage noted on post-biopsy CT.  Needle track embolization with gelfoam slurry, no evidence of enlarging hematoma or continued hemorrhage.  Complications: Retroperitoneal hemorrhage  Estimated Blood Loss: <10 mL  Recommendations: -Strict 3 hours bedrest -Recommend Q6H CBC overnight -Low threshold for multiphase CTA abdomen/pelvis if exhibiting signs/symptoms of hemorrhagic shock -Follow up Pathology results   Ruthann Cancer, MD

## 2020-12-06 NOTE — Progress Notes (Addendum)
Patient in IR for biopsy.

## 2020-12-06 NOTE — Progress Notes (Signed)
Patient took his home medication, did not want to take the hospital brand.  Patient with home medication at the bedside, refuses to let me take the medication down to pharmacy to verify, educated patient on the process and the rational behind it, he stated he understand but wants his home medication at the bedside and also to keep up with the brands he is use to; stated his last admission he kept his home medication at the bedside and just let the nurse know what he is taking. Dr. Lonny Prude notified.

## 2020-12-06 NOTE — Progress Notes (Signed)
Pt refuse schedule synthroid this morning. Glenn Gonzalez he will take his own synthroid.

## 2020-12-06 NOTE — Progress Notes (Signed)
[facility]  PROGRESS NOTE  Glenn Gonzalez ZMO:[id number] DOB: 1961-08-26 DOA: 12/08/2020 PCP: Lajean Manes, MD Admit date - 12/21/2020   Admitting Physician Jonnie Finner, DO  Outpatient Primary MD for the patient is Lajean Manes, MD  LOS - 0 Brief Narrative   Glenn Gonzalez is a 60 y.o. year old male with medical history significant for Cardiac transplant on tacrolimus and prednisone, GERD, high-grade T1 bladder cancer with recent hospitalization from 03/54-6/5 for AKI complicated by hydronephrosis in the setting of new retroperitoneal lymphadenopathy patient underwent stent placement by urology and was treated for postoperative multifocal pneumonia.  He presented from urology office with reports of increased back pain not well controlled on oral pain medication as well as generalized fatigue, poor appetite was brought in under observation due to concern for back pain in the setting of known retroperitoneal lymphadenopathy.  Hospital course complicated by acute hyponatremia in the setting of poor appetite requiring IV fluids and persistent leukocytosis.  CT abdomen imaging consistent with retroperitoneal adenopathy and dilation of right renal pelvis and collecting systems concerning for urothelial neoplasm with extensive metastatic process.  Subjective  Reports he continues to have reflux.  Poor appetite.  A & P    Retroperitoneal adenopathy and perinephric nodularity with ongoing back pain.  Patient is neurologically intact with no urinary continence, no bowel incontinence.  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 Likely related to  diminished oral intake l intake as it has slightly improved from nadir of 119-1 4 over the past 12 hours with IV fluids. -Continue monitor BMP -continue normal saline  Diminished oral intake.  Likely complicated by retroperitoneal lymphadenopathy as well as some noted ascites though small volume on CT abdomen.  Possibly further complicated by seems to be poorly controlled reflux symptoms - Monitor on liquid diet Diazepam as needed-continue IV fluids we will monitor ability to tolerate oral intake  GERD.  Last EGD 08/2020 within normal limits.  Reports increase reflux symptoms but no overt nausea or vomiting - Continue PPI  -Consider trial of sucralfate or Aciphex  Leukocytosis, stable.  Discharge 1/30 WBC was 19.7, actually slightly improved currently at 18.1.  Remains afebrile, -Daily CBC - Blood cultures and comes  Prior multifocal pneumonia.  On room air.  Has no cough. -Complete cefdinir course from previous hospitalization, end date 1/10  Gout, stable-continue allopurinol  History of cardiac transplant.  Denies any chest pain.-Continue tacrolimus, prednisone  CKD stage II.  Most recent baseline creatinine 1.8 from last hospitalization. 2. Monitor output -avoid nephrotoxins.  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 Irene Limbo also followed by urology as outpatient  Hypothyroidism, TSH stable-continue Synthroid        Family Communication  : None  Code Status : Full  Disposition Plan  :  Patient is from home. Anticipated d/c date: 2 to 3 days. Barriers to d/c or necessity for inpatient status:  Requires IV fluids for hyponatremia Consults  :  IR  Procedures  :  Pending  DVT Prophylaxis  :  SCDS  MDM: The below labs and imaging reports were reviewed and summarized above.  Medication management as above.  Lab Results  Component Value Date   PLT 459 (  H) 12/06/2020    Diet :  Diet Order            Diet full  liquid Room service appropriate? Yes; Fluid consistency: Thin  Diet effective now                  Inpatient Medications Scheduled Meds: . allopurinol  200 mg Oral Daily  . azelastine  1 spray Each Nare Daily   And  . fluticasone  1 spray Each Nare Daily  . cefdinir  300 mg Oral BID  . docusate sodium  100 mg Oral BID  . famotidine  20 mg Oral BID  . levothyroxine  125 mcg Oral Daily  . loratadine  10 mg Oral Daily  . pantoprazole  40 mg Oral Daily  . predniSONE  5 mg Oral Daily  . Tacrolimus ER  1.5 mg Oral Daily  . traZODone  100 mg Oral QHS   Continuous Infusions: . sodium chloride 100 mL/hr at 12/06/20 1156   PRN Meds:.acetaminophen **OR** acetaminophen, calcium carbonate, magnesium hydroxide, morphine injection, ondansetron **OR** ondansetron (ZOFRAN) IV, oxyCODONE  Antibiotics  :   Anti-infectives (From admission, onward)   Start     Dose/Rate Route Frequency Ordered Stop   12/04/2020 2200  cefdinir (OMNICEF) capsule 300 mg        300 mg Oral 2 times daily 12/23/2020 1716 12/10/20 2159       Objective   Vitals:   12/30/2020 1742 12/11/2020 2138 12/06/20 0326 12/06/20 1318  BP: (!) 142/90 (!) 138/92 134/82 119/78  Pulse: 92 90 97 92  Resp: 20 18  16   Temp: 98.2 F (36.8 C) 98.6 F (37 C) 97.9 F (36.6 C) 98.2 F (36.8 C)  TempSrc: Oral Oral Oral Oral  SpO2: 96% 95% 93% 95%  Weight:      Height:        SpO2: 95 %  Wt Readings from Last 3 Encounters:  12/08/2020 102.1 kg  11/29/20 100.2 kg  08/31/20 101.2 kg     Intake/Output Summary (Last 24 hours) at 12/06/2020 1503 Last data filed at 12/06/2020 1156 Gross per 24 hour  Intake 1586.21 ml  Output 2250 ml  Net -663.79 ml    Physical Exam:     Awake Alert, Oriented X 3, Normal affect Able to move all extremities without any focal deficits appreciated [place].AT, Normal respiratory effort on room air, CTAB RRR,No Gallops,Rubs or new Murmurs,  +ve B.Sounds, Abd Soft and distended, No tenderness, No  rebound, guarding or rigidity. No Cyanosis, No new Rash or bruise    I have personally reviewed the following:   Data Reviewed:  CBC Recent Labs  Lab 11/29/20 2250 11/30/20 0255 12/20/2020 0639 12/02/20 0522 12/03/20 0520 12/25/2020 1923 12/06/20 0519  WBC 16.8*   < > 20.5* 19.8* 19.7* 19.7* 18.1*  HGB 13.3   < > 12.8* 12.9* 14.4 14.1 13.5  HCT 39.6   < > 38.5* 40.2 42.2 42.0 39.5  PLT 381   < > 373 409* 460* 418* 459*  MCV 95.0   < > 96.5 97.8 95.3 94.0 91.9  MCH 31.9   < > 32.1 31.4 32.5 31.5 31.4  MCHC 33.6   < > 33.2 32.1 34.1 33.6 34.2  RDW 15.5   < > 16.0* 16.0* 15.4 14.7 14.8  LYMPHSABS 0.3*  --   --  0.6* 0.8 0.8  --   MONOABS 0.8  --   --  2.0* 1.8* 2.0*  --  EOSABS 0.0  --   --  0.1 0.1 0.0  --   BASOSABS 0.0  --   --  0.1 0.1 0.1  --    < > = values in this interval not displayed.    Chemistries  Recent Labs  Lab 12/20/2020 0639 12/02/20 0522 12/03/20 0520 12/10/2020 1923 12/06/20 0519 12/06/20 1020  NA 130* 132* 130* 119* 121* 124*  K 4.3 3.8 3.7 4.2 3.7 4.0  CL 97* 97* 93* 82* 85* 88*  CO2 23 23 21* 25 22 23   GLUCOSE 120* 102* 105* 106* 81 93  BUN 32* 24* 22* 32* 30* 32*  CREATININE 2.34* 1.97* 1.87* 1.81* 1.87* 2.01*  CALCIUM 8.5* 8.6* 9.3 9.2 8.6* 8.8*  MG  --  1.7 1.5* 1.7  --   --   AST 20  --   --  22 21  --   ALT 10  --   --  12 11  --   ALKPHOS 61  --   --  58 54  --   BILITOT 0.6  --   --  0.7 0.9  --    ------------------------------------------------------------------------------------------------------------------ No results for input(s): CHOL, HDL, LDLCALC, TRIG, CHOLHDL, LDLDIRECT in the last 72 hours.  No results found for: HGBA1C ------------------------------------------------------------------------------------------------------------------ Recent Labs    12/06/20 0519  TSH 3.931   ------------------------------------------------------------------------------------------------------------------ No results for input(s):  VITAMINB12, FOLATE, FERRITIN, TIBC, IRON, RETICCTPCT in the last 72 hours.  Coagulation profile Recent Labs  Lab 11/30/20 0031  INR 1.0    No results for input(s): DDIMER in the last 72 hours.  Cardiac Enzymes No results for input(s): CKMB, TROPONINI, MYOGLOBIN in the last 168 hours.  Invalid input(s): CK ------------------------------------------------------------------------------------------------------------------    Component Value Date/Time   BNP 121.1 (H) 11/30/2020 0031    Micro Results Recent Results (from the past 240 hour(s))  SARS Coronavirus 2 by RT PCR (hospital order, performed in [facility] hospital lab) Nasopharyngeal Nasopharyngeal Swab     Status: None   Collection Time: 11/29/20  2:45 PM   Specimen: Nasopharyngeal Swab  Result Value Ref Range Status   SARS Coronavirus 2 NEGATIVE NEGATIVE Final    Comment: (NOTE) SARS-CoV-2 target nucleic acids are NOT DETECTED.  The SARS-CoV-2 RNA is generally detectable in upper and lower respiratory specimens during the acute phase of infection. The lowest concentration of SARS-CoV-2 viral copies this assay can detect is 250 copies / mL. A negative result does not preclude SARS-CoV-2 infection and should not be used as the sole basis for treatment or other patient management decisions.  A negative result may occur with improper specimen collection / handling, submission of specimen other than nasopharyngeal swab, presence of viral mutation(s) within the areas targeted by this assay, and inadequate number of viral copies (<250 copies / mL). A negative result must be combined with clinical observations, patient history, and epidemiological information.  Fact Sheet for Patients:   StrictlyIdeas.no  Fact Sheet for Healthcare Providers: BankingDealers.co.za  This test is not yet approved or  cleared by the [place] FDA and has been authorized for detection and/or  diagnosis of SARS-CoV-2 by FDA under an Emergency Use Authorization (EUA).  This EUA will remain in effect (meaning this test can be used) for the duration of the COVID-19 declaration under Section 564(b)(1) of the Act, 21 U.S.C. section 360bbb-3(b)(1), unless the authorization is terminated or revoked sooner.  Performed at [facility], [place] [address], [place], [place] [address]   Urine Culture  Status: None   Collection Time: 11/29/20  6:35 PM   Specimen: PATH Other; Tissue  Result Value Ref Range Status   Specimen Description   Final    URINE, RANDOM Performed at [place] [address] [place], [place] [address]    Special Requests   Final    NONE Performed at [facility], [place] [address], [place], [place] [address]    Culture   Final    NO GROWTH Performed at [facility], [place] [address], [place], [place] [address]    Report Status 12/26/2020 FINAL  Final  Culture, blood (x 2)     Status: None   Collection Time: 11/30/20 12:31 AM   Specimen: BLOOD  Result Value Ref Range Status   Specimen Description   Final    BLOOD RIGHT HAND Performed at [place] [address] [place], [place] [address]    Special Requests   Final    BOTTLES DRAWN AEROBIC ONLY Blood Culture adequate volume Performed at [place] [address], [place], [place] [address]    Culture   Final    NO GROWTH 5 DAYS Performed at [facility], [place] [address], [place], [place] [address]    Report Status 12/17/2020 FINAL  Final  Culture, blood (x 2)     Status: None   Collection Time: 11/30/20 12:31 AM   Specimen: BLOOD  Result Value Ref Range Status   Specimen Description   Final    BLOOD LEFT HAND Performed at [place] [address] [place], [place] [address]    Special Requests   Final    BOTTLES DRAWN AEROBIC ONLY Blood Culture adequate  volume Performed at [place] [address], [place], [place] [address]    Culture   Final    NO GROWTH 5 DAYS Performed at [facility], [place] [address], [place], [place] [address]    Report Status 12/08/2020 FINAL  Final  Resp Panel by RT-PCR (Flu A&B, Covid) Nasopharyngeal Swab     Status: None   Collection Time: 11/30/20  4:30 AM   Specimen: Nasopharyngeal Swab; Nasopharyngeal(NP) swabs in vial transport medium  Result Value Ref Range Status   SARS Coronavirus 2 by RT PCR NEGATIVE NEGATIVE Final    Comment: (NOTE) SARS-CoV-2 target nucleic acids are NOT DETECTED.  The SARS-CoV-2 RNA is generally detectable in upper respiratory specimens during the acute phase of infection. The lowest concentration of SARS-CoV-2 viral copies this assay can detect is 138 copies/mL. A negative result does not preclude SARS-Cov-2 infection and should not be used as the sole basis for treatment or other patient management decisions. A negative result may occur with  improper specimen collection/handling, submission of specimen other than nasopharyngeal swab, presence of viral mutation(s) within the areas targeted by this assay, and inadequate number of viral copies(<138 copies/mL). A negative result must be combined with clinical observations, patient history, and epidemiological information. The expected result is Negative.  Fact Sheet for Patients:  EntrepreneurPulse.com.au  Fact Sheet for Healthcare Providers:  IncredibleEmployment.be  This test is no t yet approved or cleared by the [place] FDA and  has been authorized for detection and/or diagnosis of SARS-CoV-2 by FDA under an Emergency Use Authorization (EUA). This EUA will remain  in effect (meaning this test can be used) for the duration of the COVID-19 declaration under Section 564(b)(1) of the Act, 21  U.S.C.section 360bbb-3(b)(1), unless the authorization is  terminated  or revoked sooner.       Influenza A by PCR NEGATIVE NEGATIVE Final   Influenza B by PCR NEGATIVE NEGATIVE Final    Comment: (NOTE) The Xpert Xpress SARS-CoV-2/FLU/RSV plus assay is intended as an aid in the diagnosis of influenza from Nasopharyngeal swab specimens and should not be used as a sole basis for treatment. Nasal washings and aspirates are unacceptable for Xpert Xpress SARS-CoV-2/FLU/RSV testing.  Fact Sheet for Patients: EntrepreneurPulse.com.au  Fact Sheet for Healthcare Providers: IncredibleEmployment.be  This test is not yet approved or cleared by the [place] FDA and has been authorized for detection and/or diagnosis of SARS-CoV-2 by FDA under an Emergency Use Authorization (EUA). This EUA will remain in effect (meaning this test can be used) for the duration of the COVID-19 declaration under Section 564(b)(1) of the Act, 21 U.S.C. section 360bbb-3(b)(1), unless the authorization is terminated or revoked.  Performed at [facility], [place] [address], [place], [place] [address]   MRSA PCR Screening     Status: None   Collection Time: 11/30/20 10:22 AM   Specimen: Nasopharyngeal  Result Value Ref Range Status   MRSA by PCR NEGATIVE NEGATIVE Final    Comment:        The GeneXpert MRSA Assay (FDA approved for NASAL specimens only), is one component of a comprehensive MRSA colonization surveillance program. It is not intended to diagnose MRSA infection nor to guide or monitor treatment for MRSA infections. Performed at [facility], [place] [address], [place], [place] [address]   SARS CORONAVIRUS 2 (TAT 6-24 HRS) Nasopharyngeal Nasopharyngeal Swab     Status: None   Collection Time: 12/02/2020  1:48 PM   Specimen: Nasopharyngeal Swab  Result Value Ref Range Status   SARS Coronavirus 2 NEGATIVE NEGATIVE Final    Comment: (NOTE) SARS-CoV-2 target nucleic acids are NOT  DETECTED.  The SARS-CoV-2 RNA is generally detectable in upper and lower respiratory specimens during the acute phase of infection. Negative results do not preclude SARS-CoV-2 infection, do not rule out co-infections with other pathogens, and should not be used as the sole basis for treatment or other patient management decisions. Negative results must be combined with clinical observations, patient history, and epidemiological information. The expected result is Negative.  Fact Sheet for Patients: SugarRoll.be  Fact Sheet for Healthcare Providers: https://www.woods-mathews.com/  This test is not yet approved or cleared by the [place] FDA and  has been authorized for detection and/or diagnosis of SARS-CoV-2 by FDA under an Emergency Use Authorization (EUA). This EUA will remain  in effect (meaning this test can be used) for the duration of the COVID-19 declaration under Se ction 564(b)(1) of the Act, 21 U.S.C. section 360bbb-3(b)(1), unless the authorization is terminated or revoked sooner.  Performed at [facility], [place] [address] [place], [place] [address]     Radiology Reports CT ABDOMEN PELVIS WO CONTRAST  Result Date: 12/29/2020 CLINICAL DATA:  Cancer of unknown primary in this 61 year old male. EXAM: CT CHEST, ABDOMEN AND PELVIS WITHOUT CONTRAST TECHNIQUE: Multidetector CT imaging of the chest, abdomen and pelvis was performed following the standard protocol without IV contrast. COMPARISON:  CT abdomen and pelvis of the same date. Also with CT of December 30th of 2021 FINDINGS: CT CHEST FINDINGS Cardiovascular: Calcified atheromatous plaque in the thoracic aorta. Heart size is normal following sternotomy for heart transplant by report a [facility] medical center. Three-vessel coronary artery calcification.  No pericardial effusion. Calcifications in the LEFT atrium partially seen on previous imaging are of uncertain  significance central pulmonary vasculature also with some signs of calcification. Mediastinum/Nodes: Ovoid structure associated with LEFT thyroid 11 mm similar to previous imaging, cervical spine from 2017. Thyroid as described above. Calcifications in the LEFT supraclavicular region may be within venous structures on the LEFT. No axillary lymphadenopathy. Surgical clips in the RIGHT axilla and soft tissue thickening over the LEFT pectoralis musculature similar grossly compared to remote MRI evaluation 2018 and surgical clips seen on numerous priors in the RIGHT axilla. No mediastinal lymphadenopathy. No gross hilar lymphadenopathy. Lungs/Pleura: Small RIGHT of pleural effusion and basilar airspace disease. Small nodule in the superior aspect of the RIGHT middle lobe approximately 3 mm on image 57 of series 5. Biapical scarring. Small nodule in the LEFT upper lobe 6 mm (image 51, series 5) airways are patent. Musculoskeletal: Evidence ir sternotomy without significant bony bridging but with sclerotic margins along the sternotomy line similar to studies dating back to August of 2021 with respect to visualized portions on prior abdomen studies no destructive bone finding or acute bone process. CT ABDOMEN PELVIS FINDINGS Hepatobiliary: 1.9 cm lesion in the RIGHT hepatic lobe near the dome of the RIGHT hemi liver does not display attenuation values of simple cyst is unchanged from the study acquired on the same date at another facility. Additionally a 1.4 cm lesion in the RIGHT hepatic lobe on image 23 of series 2 is unchanged. Sludge in the gallbladder. Tiny low-density foci in the LEFT hepatic lobe without change 1 on image 19 of series 2 in the lateral segment measuring 5 mm and another in the medial segment of the LEFT hepatic lobe measuring 12 mm on image 21 of series 2 these areas appears similar dating back to August of 2020, lesions in the RIGHT hepatic lobe are new. Pancreas: Normal pancreatic contour without  signs of inflammation. Spleen: Post splenectomy. Adrenals/Urinary Tract: Adrenal glands, normal on the LEFT and obscured on the RIGHT by perinephric and Peri adrenal nodularity. Fullness of the RIGHT renal pelvis with nephroureteral stent in place showing soft tissue density with distension of the renal pelvis and calices similar to the study acquired on the same date. Nephrolithiasis also unchanged. Perinephric nodularity along the medial upper pole the LEFT kidney measures 6.4 x 2.7 cm which is within 1-2 mm of its previous measurement when measured by this observer on the prior study. Increased in size when compared to the study of November 30, 2020. Renal cortical scarring on the LEFT with nephrolithiasis in the lower pole unchanged from very recent comparison. Urinary bladder with small amount of gas in the urinary bladder with distal aspect of the stent in place, loop coiled in the urinary bladder. Stomach/Bowel: Scattered colonic diverticulosis without acute gastrointestinal process. Normal appendix. Stool and contrast throughout the colon. Vascular/Lymphatic: Calcified atheromatous plaque in the abdominal aorta without aneurysmal dilation. Retroperitoneal adenopathy unchanged from scan earlier the same day, largest lymph nodes behind the inferior vena cava and in the intra-aortocaval groove and in the upper abdomen, largest on image 41 of series 2 measuring 1.9 cm. Smaller lymph nodes along the LEFT periaortic chain. Postoperative changes in the retroperitoneum associated with previous lymphadenectomy No pelvic lymphadenopathy. Reproductive: Prostate with uro lift device deployment bilaterally similar to recent priors. Other: Extensive retroperitoneal fascial thickening/stranding extending into the root of the small bowel mesentery, nodular changes associated with this thickening on the RIGHT outlining the renal fascia this crosses  the midline where it is without significant nodularity but with fascial  thickening and stranding, also tracking into the pelvis. Small amount of fluid in the pelvis. Musculoskeletal: Spinal degenerative changes. No destructive bone process or acute bone finding. IMPRESSION: 1. Retroperitoneal adenopathy and perinephric nodularity with dilation of the RIGHT renal pelvis and collecting systems, filled with what is suspected to represent neoplasm. 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 Differential considerations given heart transplantation and presumed immunosuppression would include posttransplant lymphoproliferative disorder/lymphoma. 2. Dense material in collecting systems may represent a mixture of tumor and or blood and there is profound dilation of collecting systems of the RIGHT kidney. This is unchanged compared to the recent comparison study. 3. Signs of hepatic involvement with new lesions in the RIGHT hepatic lobe. 4. Ascites with small amount of ascites with very subtle infiltration of fat in the omentum not mentioned above, for instance on image 50 of series 2 raising the question of peritoneal involvement as well. This area is in the range of 2-3 mm. 5. Small RIGHT pleural effusion and basilar airspace disease. 6. Postoperative changes about the chest likely related to prior heart transplantation with surgical clips in the RIGHT axilla and thickening and calcification along the anterior pectoralis musculature not changed since 2018. 7. Calcification in the LEFT atrium also about the aortic root and pulmonary artery and to a lesser degree in the RIGHT atrium likely reflects changes of vascular anastomotic sites in the setting of heart transplantation. 8. 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 9. Post splenectomy. 10. Aortic atherosclerosis. 11. Small nodule in the LEFT upper lobe 6 mm airways  are patent. These results will be called to the ordering clinician or representative by the Radiologist Assistant, and communication documented in the PACS or [employer]. Aortic Atherosclerosis (ICD10-I70.0). Electronically Signed   By: Zetta Bills M.D.   On: 12/04/2020 17:38   DG Chest 1 View  Result Date: 11/29/2020 CLINICAL DATA:  Hypoxia EXAM: CHEST  1 VIEW COMPARISON:  08/03/2020 FINDINGS: Since the prior examination, there has developed right basilar consolidation, possibly infectious in the appropriate clinical setting. Mild focal infiltrate is also noted within the right apex. Left lung is clear. No pneumothorax or pleural effusion. Median sternotomy has been performed. Cardiac size within normal limits. Multiple healed right rib fractures are noted. Multiple surgical clips are seen within the right axilla. IMPRESSION: Interval development of multifocal pulmonary infiltrate within the right lung, more focal within the right lung base, suspicious for atypical infection in the acute setting. Electronically Signed   By: Fidela Salisbury MD   On: 11/29/2020 22:27   DG Chest 2 View  Result Date: 12/03/2020 CLINICAL DATA:  Bladder cancer.  Follow-up multifocal pneumonia. EXAM: CHEST - 2 VIEW COMPARISON:  11/29/2020 FINDINGS: Prior median sternotomy. Heart is normal size. Patchy right lung airspace disease in the right apex and right lower lung. Minimal left base linear atelectasis or scarring. Overall aeration in the right lung slightly improved. No effusions. IMPRESSION: Patchy right lung airspace disease with slight improvement since prior study. Left base atelectasis or scarring. Electronically Signed   By: Rolm Baptise M.D.   On: 12/28/2020 13:25   CT CHEST WO CONTRAST  Result Date: 12/24/2020 CLINICAL DATA:  Cancer of unknown primary in this 60 year old male. EXAM: CT CHEST, ABDOMEN AND PELVIS WITHOUT CONTRAST TECHNIQUE: Multidetector CT imaging of  the chest, abdomen and pelvis was performed  following the standard protocol without IV contrast. COMPARISON:  CT abdomen and pelvis of the same date. Also with CT of December 30th of 2021 FINDINGS: CT CHEST FINDINGS Cardiovascular: Calcified atheromatous plaque in the thoracic aorta. Heart size is normal following sternotomy for heart transplant by report a [facility]. Three-vessel coronary artery calcification. No pericardial effusion. Calcifications in the LEFT atrium partially seen on previous imaging are of uncertain significance central pulmonary vasculature also with some signs of calcification. Mediastinum/Nodes: Ovoid structure associated with LEFT thyroid 11 mm similar to previous imaging, cervical spine from 2017. Thyroid as described above. Calcifications in the LEFT supraclavicular region may be within venous structures on the LEFT. No axillary lymphadenopathy. Surgical clips in the RIGHT axilla and soft tissue thickening over the LEFT pectoralis musculature similar grossly compared to remote MRI evaluation 2018 and surgical clips seen on numerous priors in the RIGHT axilla. No mediastinal lymphadenopathy. No gross hilar lymphadenopathy. Lungs/Pleura: Small RIGHT of pleural effusion and basilar airspace disease. Small nodule in the superior aspect of the RIGHT middle lobe approximately 3 mm on image 57 of series 5. Biapical scarring. Small nodule in the LEFT upper lobe 6 mm (image 51, series 5) airways are patent. Musculoskeletal: Evidence ir sternotomy without significant bony bridging but with sclerotic margins along the sternotomy line similar to studies dating back to August of 2021 with respect to visualized portions on prior abdomen studies no destructive bone finding or acute bone process. CT ABDOMEN PELVIS FINDINGS Hepatobiliary: 1.9 cm lesion in the RIGHT hepatic lobe near the dome of the RIGHT hemi liver does not display attenuation values of simple cyst is unchanged from the study acquired on the same date at another  facility. Additionally a 1.4 cm lesion in the RIGHT hepatic lobe on image 23 of series 2 is unchanged. Sludge in the gallbladder. Tiny low-density foci in the LEFT hepatic lobe without change 1 on image 19 of series 2 in the lateral segment measuring 5 mm and another in the medial segment of the LEFT hepatic lobe measuring 12 mm on image 21 of series 2 these areas appears similar dating back to August of 2020, lesions in the RIGHT hepatic lobe are new. Pancreas: Normal pancreatic contour without signs of inflammation. Spleen: Post splenectomy. Adrenals/Urinary Tract: Adrenal glands, normal on the LEFT and obscured on the RIGHT by perinephric and Peri adrenal nodularity. Fullness of the RIGHT renal pelvis with nephroureteral stent in place showing soft tissue density with distension of the renal pelvis and calices similar to the study acquired on the same date. Nephrolithiasis also unchanged. Perinephric nodularity along the medial upper pole the LEFT kidney measures 6.4 x 2.7 cm which is within 1-2 mm of its previous measurement when measured by this observer on the prior study. Increased in size when compared to the study of November 30, 2020. Renal cortical scarring on the LEFT with nephrolithiasis in the lower pole unchanged from very recent comparison. Urinary bladder with small amount of gas in the urinary bladder with distal aspect of the stent in place, loop coiled in the urinary bladder. Stomach/Bowel: Scattered colonic diverticulosis without acute gastrointestinal process. Normal appendix. Stool and contrast throughout the colon. Vascular/Lymphatic: Calcified atheromatous plaque in the abdominal aorta without aneurysmal dilation. Retroperitoneal adenopathy unchanged from scan earlier the same day, largest lymph nodes behind the inferior vena cava and in the intra-aortocaval groove and in the upper abdomen, largest on image 41 of series 2  measuring 1.9 cm. Smaller lymph nodes along the LEFT periaortic chain.  Postoperative changes in the retroperitoneum associated with previous lymphadenectomy No pelvic lymphadenopathy. Reproductive: Prostate with uro lift device deployment bilaterally similar to recent priors. Other: Extensive retroperitoneal fascial thickening/stranding extending into the root of the small bowel mesentery, nodular changes associated with this thickening on the RIGHT outlining the renal fascia this crosses the midline where it is without significant nodularity but with fascial thickening and stranding, also tracking into the pelvis. Small amount of fluid in the pelvis. Musculoskeletal: Spinal degenerative changes. No destructive bone process or acute bone finding. IMPRESSION: 1. Retroperitoneal adenopathy and perinephric nodularity with dilation of the RIGHT renal pelvis and collecting systems, filled with what is suspected to represent neoplasm. 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 Differential considerations given heart transplantation and presumed immunosuppression would include posttransplant lymphoproliferative disorder/lymphoma. 2. Dense material in collecting systems may represent a mixture of tumor and or blood and there is profound dilation of collecting systems of the RIGHT kidney. This is unchanged compared to the recent comparison study. 3. Signs of hepatic involvement with new lesions in the RIGHT hepatic lobe. 4. Ascites with small amount of ascites with very subtle infiltration of fat in the omentum not mentioned above, for instance on image 50 of series 2 raising the question of peritoneal involvement as well. This area is in the range of 2-3 mm. 5. Small RIGHT pleural effusion and basilar airspace disease. 6. Postoperative changes about the chest likely related to prior heart transplantation with surgical clips in the RIGHT axilla and thickening and calcification along the anterior  pectoralis musculature not changed since 2018. 7. Calcification in the LEFT atrium also about the aortic root and pulmonary artery and to a lesser degree in the RIGHT atrium likely reflects changes of vascular anastomotic sites in the setting of heart transplantation. 8. 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 9. Post splenectomy. 10. Aortic atherosclerosis. 11. Small nodule in the LEFT upper lobe 6 mm airways are patent. These results will be called to the ordering clinician or representative by the Radiologist Assistant, and communication documented in the PACS or [employer]. Aortic Atherosclerosis (ICD10-I70.0). Electronically Signed   By: Zetta Bills M.D.   On: 12/20/2020 17:38   NM Pulmonary Perfusion  Result Date: 12/04/2020 CLINICAL DATA:  Respiratory failure EXAM: NUCLEAR MEDICINE PERFUSION LUNG SCAN TECHNIQUE: Perfusion images were obtained in multiple projections after intravenous injection of radiopharmaceutical. Ventilation scans intentionally deferred if perfusion scan and chest x-ray adequate for interpretation during COVID 19 epidemic. RADIOPHARMACEUTICALS:  4.1 mCi Tc-23m MAA IV COMPARISON:  Chest x-ray 12/14/2020 FINDINGS: Slightly heterogeneous distribution of radiotracer within the lung fields. Multiple small bilateral wedge-shaped perfusion defects. No corresponding radiographic abnormality. No large mismatched segmental perfusion defect. IMPRESSION: Intermediate probability for pulmonary embolism. Electronically Signed   By: Davina Poke D.O.   On: 12/22/2020 13:30   DG C-Arm 1-60 Min-No Report  Result Date: 11/29/2020 Fluoroscopy was utilized by the requesting physician.  No radiographic interpretation.   ECHOCARDIOGRAM COMPLETE  Result Date: 11/30/2020    ECHOCARDIOGRAM REPORT   Patient Name:   WANE MOLLETT Alphin Date of Exam: 11/30/2020 Medical Rec #:  [id number]         Height:       74.0  in Accession #:    [id number]  Weight:       221.0 lb Date of Birth:  1961-07-15        BSA:          2.268 m Patient Age:    44 years          BP:           103/59 mmHg Patient Gender: M                 HR:           94 bpm. Exam Location:  Inpatient Procedure: 2D Echo, Cardiac Doppler, Color Doppler and Intracardiac            Opacification Agent Indications:    Dyspnea R06.00  History:        Patient has prior history of Echocardiogram examinations, most                 recent 05/12/2017. Arrythmias:Atrial Fibrillation; Risk                 Factors:Non-Smoker. GERD. 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  Sonographer:    Vickie Epley RDCS Referring Phys: [id number] [facility]  Sonographer Comments: Suboptimal apical window and suboptimal subcostal window. Pt unable to turn on side due to herniated discs. IMPRESSIONS  1. Left ventricular ejection fraction, by estimation, is 60 to 65%. The left ventricle has normal function. The left ventricle has no regional wall motion abnormalities. Left ventricular diastolic parameters were normal.  2. Right ventricular systolic function is normal. The right ventricular size is normal. There is normal pulmonary artery systolic pressure. The estimated right ventricular systolic pressure is 67.3 mmHg.  3. The mitral valve is normal in structure. No evidence of mitral valve regurgitation. No evidence of mitral stenosis.  4. The aortic valve is normal in structure. Aortic valve regurgitation is not visualized. No aortic stenosis is present.  5. The inferior vena cava is normal in size with greater than 50% respiratory variability, suggesting right atrial pressure of 3 mmHg. FINDINGS  Left Ventricle: Left ventricular ejection fraction, by estimation, is 60 to 65%. The left ventricle has normal function. The left ventricle has no regional wall motion abnormalities. Definity contrast agent was given IV to delineate the left ventricular  endocardial borders. The left ventricular  internal cavity size was normal in size. There is no left ventricular hypertrophy. Left ventricular diastolic parameters were normal. Normal left ventricular filling pressure. Right Ventricle: The right ventricular size is normal. No increase in right ventricular wall thickness. Right ventricular systolic function is normal. There is normal pulmonary artery systolic pressure. The tricuspid regurgitant velocity is 2.39 m/s, and  with an assumed right atrial pressure of 3 mmHg, the estimated right ventricular systolic pressure is 41.9 mmHg. Left Atrium: Left atrial size was normal in size. Right Atrium: Right atrial size was normal in size. Pericardium: There is no evidence of pericardial effusion. Mitral Valve: The mitral valve is normal in structure. No evidence of mitral valve regurgitation. No evidence of mitral valve stenosis. Tricuspid Valve: The tricuspid valve is normal in structure. Tricuspid valve regurgitation is mild . No evidence of tricuspid stenosis. Aortic Valve: The aortic valve is normal in structure. Aortic valve regurgitation is not visualized. No aortic stenosis is present. Pulmonic Valve: The pulmonic valve was normal in structure. Pulmonic valve regurgitation is not visualized. No evidence of pulmonic stenosis. Aorta: The aortic root is normal in size and structure. Venous: The inferior vena cava is normal in size with greater  than 50% respiratory variability, suggesting right atrial pressure of 3 mmHg. IAS/Shunts: No atrial level shunt detected by color flow Doppler.  LEFT VENTRICLE PLAX 2D LVIDd:         4.40 cm     Diastology LVIDs:         3.20 cm     LV e' medial:    10.00 cm/s LV PW:         0.90 cm     LV E/e' medial:  7.5 LV IVS:        0.90 cm     LV e' lateral:   11.30 cm/s LVOT diam:     2.50 cm     LV E/e' lateral: 6.7 LV SV:         48 LV SV Index:   21 LVOT Area:     4.91 cm  LV Volumes (MOD) LV vol d, MOD A4C: 80.2 ml LV vol s, MOD A4C: 30.4 ml LV SV MOD A4C:     80.2 ml LEFT  ATRIUM           Index LA diam:      2.70 cm 1.19 cm/m LA Vol (A4C): 16.3 ml 7.19 ml/m  AORTIC VALVE LVOT Vmax:   50.20 cm/s LVOT Vmean:  36.700 cm/s LVOT VTI:    0.099 m  AORTA Ao Root diam: 3.40 cm MITRAL VALVE               TRICUSPID VALVE MV Area (PHT): 5.38 cm    TR Peak grad:   22.8 mmHg MV Decel Time: 141 msec    TR Vmax:        239.00 cm/s MV E velocity: 75.40 cm/s MV A velocity: 36.00 cm/s  SHUNTS MV E/A ratio:  2.09        Systemic VTI:  0.10 m                            Systemic Diam: 2.50 cm Dani Gobble Croitoru MD Electronically signed by Sanda Klein MD Signature Date/Time: 11/30/2020/12:30:40 PM    Final    VAS [place] LOWER EXTREMITY VENOUS (DVT)  Result Date: 12/03/2020  Lower Venous DVT Study Indications: Pain.  Comparison Study: Previous 12/2019 Performing Technologist: Vonzell Schlatter RVT  Examination Guidelines: A complete evaluation includes B-mode imaging, spectral Doppler, color Doppler, and power Doppler as needed of all accessible portions of each vessel. Bilateral testing is considered an integral part of a complete examination. Limited examinations for reoccurring indications may be performed as noted. The reflux portion of the exam is performed with the patient in reverse Trendelenburg.  +---------+---------------+---------+-----------+----------+--------------+ RIGHT    CompressibilityPhasicitySpontaneityPropertiesThrombus Aging +---------+---------------+---------+-----------+----------+--------------+ CFV      Full           Yes      Yes                                 +---------+---------------+---------+-----------+----------+--------------+ SFJ      Full                                                        +---------+---------------+---------+-----------+----------+--------------+ FV Prox  Full                                                        +---------+---------------+---------+-----------+----------+--------------+  FV Mid   Full                                                         +---------+---------------+---------+-----------+----------+--------------+ FV DistalFull                                                        +---------+---------------+---------+-----------+----------+--------------+ PFV      Full                                                        +---------+---------------+---------+-----------+----------+--------------+ POP      Full           Yes      Yes                                 +---------+---------------+---------+-----------+----------+--------------+ PTV      Full                                                        +---------+---------------+---------+-----------+----------+--------------+ PERO     Full                                                        +---------+---------------+---------+-----------+----------+--------------+   +---------+---------------+---------+-----------+----------+--------------+ LEFT     CompressibilityPhasicitySpontaneityPropertiesThrombus Aging +---------+---------------+---------+-----------+----------+--------------+ CFV      Full           Yes      Yes                                 +---------+---------------+---------+-----------+----------+--------------+ SFJ      Full                                                        +---------+---------------+---------+-----------+----------+--------------+ FV Prox  Full                                                        +---------+---------------+---------+-----------+----------+--------------+ FV Mid   Full                                                        +---------+---------------+---------+-----------+----------+--------------+  FV DistalFull                                                        +---------+---------------+---------+-----------+----------+--------------+ PFV      Full                                                         +---------+---------------+---------+-----------+----------+--------------+ POP      Full           Yes      Yes                                 +---------+---------------+---------+-----------+----------+--------------+ PTV      Full                                                        +---------+---------------+---------+-----------+----------+--------------+ PERO     Full                                                        +---------+---------------+---------+-----------+----------+--------------+     Summary: RIGHT: - There is no evidence of deep vein thrombosis in the lower extremity.  - No cystic structure found in the popliteal fossa.  LEFT: - There is no evidence of deep vein thrombosis in the lower extremity.  - No cystic structure found in the popliteal fossa.  *See table(s) above for measurements and observations. Electronically signed by Ruta Hinds MD on 12/03/2020 at 12:15:41 PM.    Final      Time Spent in minutes  30     Desiree Hane M.D on 12/06/2020 at 3:03 PM  To page go to [email or web address] - password [facility]

## 2020-12-06 NOTE — Progress Notes (Signed)
    BRIEF OVERNIGHT PROGRESS REPORT  SUBJECTIVE: Notified of critical sodium of 119  OBJECTIVE:On assessment,  he was afebrile with blood pressure 138/92 mm Hg and pulse rate 90 beats/min. There were no focal neurological deficits; he was alert and oriented x4, and he did not demonstrate any memory deficits.   ASSESSMENT & PLAN: 60-year-old male with significant history of hydronephrosis, Hodgkin's lymphoma, and bladder cancer admitted with back pain  Hypotonic  Hyponatremia - - Check cortisol, TSH, UA, serum osmolality, sodium osmolality - IVFs with NS with goal serum sodium level not > 10 to 12 mEq per L in the first 24 hours and 18 mEq per L in the first 48 hours - Check serial sodium     Rufina Falco, BSN, MSN, DNP, CCRN,FNP-C, AGACNP- Student [facility] Nurse Practitioner  [facility]

## 2020-12-06 NOTE — Consult Note (Addendum)
[place]  [phone] Fax:([phone]   MEDICAL ONCOLOGY - INITIAL CONSULTATION  Referral MD: Dr. Oretha Milch  Reason for Referral: Retroperitoneal adenopathy, liver lesions  HPI: [name] is a 60 year old male with a past medical history significant for hydronephrosis, Hodgkin lymphoma, colon cancer, bladder cancer, history of heart transplant, gout, depression, hypothyroidism.  He had a recent hospitalization in December 2021 for hydronephrosis with rising creatinine.  The patient underwent cystoscopy with right retrograde pyelogram right ureteral stent placement on 11/29/2020.  Plan was for outpatient follow-up with oncology as retroperitoneal lymphadenopathy could have been related to infection.  They recommended a repeat scan as an outpatient and then consideration of biopsy if adenopathy persisted.  The patient presented to the emergency room on 12/04/2020 with back pain.  Pain was on the right side and sharp in character.  Unrelieved with tramadol or hydrocodone.  CBC on admission significant for leukocytosis and thrombocytosis.  Sodium was low at 119 on admission.  BUN and creatinine elevated although creatinine appears to be at baseline.  CT chest/abdomen/pelvis was performed which showed retroperitoneal adenopathy and perinephric nodularity with dilation of the right renal pelvis and collecting systems-suspected to represent neoplasm concerning for upper tract urothelial neoplasm.  Although post transplant lymphoproliferative disorder/lymphoma is also a consideration.  He also has signs of hepatic involvement with new lesions in the right hepatic lobe along with ascites and a small right pleural effusion.  The patient has been seen by IR who plans for image guided right retroperitoneal lymph node biopsy.  When seen today, the patient reports significant pain to his right flank/back.  Pain primarily over the kidney.  He has not been taking pain medication due  to concerns for increased gastric acid.  His appetite has been significantly decreased.  According to his scale, he has not lost weight but feels that the weight has not changed due to retaining fluid.  He would estimate that he probably has lost about 10 pounds overall though.  He is not having any fevers, chills, night sweats.  No headaches, dizziness, chest pain, shortness of breath.  No bleeding reported. Medical oncology was asked see the patient make recommendations regarding his retroperitoneal adenopathy and liver lesions.   Past Medical History:  Diagnosis Date   A-fib [facility])    IN PAST with old heart    Bladder tumor    Bulging lumbar disc    states " i threw my back out on tuesday 6-16, i ahve a bulging disc, but i can lie on my back a side "    Chronic fatigue    Chronic kidney disease    CREATININE NOW 1.5 stage 1 ckd   Colon cancer ([place]) DX 2019   SURGERY DONE   Complication of anesthesia    AFTER HEART TRANSPLANT TOOK 2 YEARS TO GET BACK TO SELF; patient does not want aneshesia for endoscopy procedures    Complication of anesthesia    likes propofol wants as few of anesthesia drugs as possible   Depression    SITUATIONAL   GERD (gastroesophageal reflux disease)    Gout yrs ago   History of chemotherapy 1982 for hodgkins   DAMAGED HEART MUSCLE WITH RADIATION ALSO   History of kidney stones    History of radiation therapy 1982   for hodgkins lymphoma   Hodgkin's lymphoma ([place]) 1980   IIIB. 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   Hypothyroidism    Pneumonia FROM CHF 2016  recurrent pneumonia sedf.  rad tx for lymphoma   Skin abnormality    right chest small area with squamous cell area removed 3 weeks ago as of 07-27-2020   Skin cancer    AREAS REMOVED FROM [facility], CHECK AND BACK AND HEAD SEPT 2019   Status post heart transplantation ([place]) 2017   caused by chemo drug adriomycin  :  Past Surgical History:  Procedure Laterality Date    BIOPSY  07/02/2018   Procedure: BIOPSY;  Surgeon: Carol Ada, MD;  [facility];  Service: Endoscopy;;   BIOPSY  05/20/2019   Procedure: BIOPSY;  Surgeon: Carol Ada, MD;  [facility]: [facility];  Service: Endoscopy;;   CARDIAC CATHETERIZATION N/A 04/20/2015   Procedure: Right/Left Heart Cath and Coronary Angiography;  Surgeon: Jolaine Artist, MD;  Location: [place] [facility];  Service: Cardiovascular;  Laterality: N/A;   CARDIAC CATHETERIZATION  10-11-15   pt states routine heart cath to be done at [place] 04/02/2016   Procedure: Right Heart Cath;  Surgeon: Jolaine Artist, MD;  Location: [place] [facility];  Service: Cardiovascular;  Laterality: N/A;   CARDIAC CATHETERIZATION  JULY 2019 AT [facility]   COLONOSCOPY N/A 07/02/2018   Procedure: COLONOSCOPY;  Surgeon: Carol Ada, MD;  [facility];  Service: Endoscopy;  Laterality: N/A;   COLONOSCOPY N/A 08/05/2019   Procedure: COLONOSCOPY;  Surgeon: Carol Ada, MD;  [facility];  Service: Endoscopy;  Laterality: N/A;   COLONOSCOPY WITH PROPOFOL N/A 08/03/2015   Procedure: COLONOSCOPY WITH PROPOFOL;  Surgeon: Carol Ada, MD;  [facility];  Service: Endoscopy;  Laterality: N/A;  wants to try to do procedure without sedation   COLONOSCOPY WITH PROPOFOL N/A 08/10/2020   Procedure: COLONOSCOPY WITH PROPOFOL;  Surgeon: Carol Ada, MD;  [facility];  Service: Endoscopy;  Laterality: N/A;   CYSTOSCOPY N/A 08/28/2020   Procedure: CYSTOSCOPY;  Surgeon: Remi Haggard, MD;  [facility];  Service: Urology;  Laterality: N/A;   CYSTOSCOPY W/ RETROGRADES Bilateral 07/31/2020   Procedure: CYSTOSCOPY WITH RETROGRADE PYELOGRAM;  Surgeon: Remi Haggard, MD;  [facility];  Service: Urology;  Laterality: Bilateral;   CYSTOSCOPY W/ URETERAL STENT PLACEMENT Right 11/29/2020   Procedure: CYSTOSCOPY WITH RETROGRADE PYELOGRAM/URETERAL  STENT PLACEMENT;  Surgeon: Festus Aloe, MD;  [facility];  Service: Urology;  Laterality: Right;   CYSTOSCOPY WITH INSERTION OF UROLIFT N/A 10/18/2018   Procedure: CYSTOSCOPY WITH INSERTION OF UROLIFT;  Surgeon: Cleon Gustin, MD;  Location[facility];  Service: Urology;  Laterality: N/A;  [place], URETEROSCOPY AND STENT PLACEMENT Right 10/12/2015   Procedure: CYSTOSCOPY WITH RETROGRADE PYELOGRAM, URETEROSCOPY AND STENT PLACEMENT;  Surgeon: Cleon Gustin, MD;  [facility];  Service: Urology;  Laterality: Right;   ESOPHAGOGASTRODUODENOSCOPY     ESOPHAGOGASTRODUODENOSCOPY (EGD) WITH PROPOFOL N/A 09/29/2019   Procedure: ESOPHAGOGASTRODUODENOSCOPY (EGD) WITH PROPOFOL;  Surgeon: Carol Ada, MD;  [facility];  Service: Endoscopy;  Laterality: N/A;   ESOPHAGOGASTRODUODENOSCOPY (EGD) WITH PROPOFOL N/A 08/10/2020   Procedure: ESOPHAGOGASTRODUODENOSCOPY (EGD) WITH PROPOFOL;  Surgeon: Carol Ada, MD;  [facility];  Service: Endoscopy;  Laterality: N/A;   EXPLORATORY LAPAROTOMY  1982   staging laparotomy   FLEXIBLE SIGMOIDOSCOPY N/A 05/20/2019   Procedure: FLEXIBLE SIGMOIDOSCOPY;  Surgeon: Carol Ada, MD;  [facility];  Service: Endoscopy;  Laterality: N/A;   FLEXIBLE SIGMOIDOSCOPY N/A 04/20/2020   Procedure: FLEXIBLE SIGMOIDOSCOPY;  Surgeon: Carol Ada, MD;  Location: [facility];  Service: Endoscopy;  Laterality: N/A;   gynemastia Bilateral    HEART TRANSPLANT  06/20/2016   at [place] 07/02/2018   Procedure: HOT HEMOSTASIS (ARGON PLASMA COAGULATION/BICAP);  Surgeon: Carol Ada, MD;  Location: [facility];  Service: Endoscopy;  Laterality: N/A;   LUMBAR LAMINECTOMY/DECOMPRESSION MICRODISCECTOMY Right 01/04/2016   Procedure: Microdiscectomy Lumbar four Lumbar five right;  Surgeon: Eustace Moore, MD;  Location: [place] [facility];  Service: Neurosurgery;   Laterality: Right;   neck biopses     x5   permanent pacemaker     [employer] COGNIS device REMOVED WITH OLD HEART   POLYPECTOMY  07/02/2018   Procedure: POLYPECTOMY;  Surgeon: Carol Ada, MD;  [facility];  Service: Endoscopy;;   POLYPECTOMY  08/05/2019   Procedure: POLYPECTOMY;  Surgeon: Carol Ada, MD;  [facility];  Service: Endoscopy;;   POLYPECTOMY  04/20/2020   Procedure: POLYPECTOMY;  Surgeon: Carol Ada, MD;  [facility];  Service: Endoscopy;;   POLYPECTOMY  08/10/2020   Procedure: POLYPECTOMY;  Surgeon: Carol Ada, MD;  [facility];  Service: Endoscopy;;   PORTACATH PLACEMENT     06-29-15 inserted, now removed.   SPLENECTOMY, TOTAL  1981   STONE EXTRACTION WITH BASKET Right 10/12/2015   Procedure: STONE EXTRACTION WITH BASKET;  Surgeon: Cleon Gustin, MD;  [facility];  Service: Urology;  Laterality: Right;   SUBMUCOSAL INJECTION  07/02/2018   Procedure: SUBMUCOSAL INJECTION;  Surgeon: Carol Ada, MD;  [facility];  Service: Endoscopy;;   TONSILLECTOMY  as child   TRANSURETHRAL RESECTION OF BLADDER TUMOR N/A 07/31/2020   Procedure: TRANSURETHRAL RESECTION OF BLADDER TUMOR (TURBT) FULGERATION;  Surgeon: Remi Haggard, MD;  Location: [facility];  Service: Urology;  Laterality: N/A;   TRANSURETHRAL RESECTION OF BLADDER TUMOR N/A 08/28/2020   Procedure: TRANSURETHRAL RESECTION OF BLADDER TUMOR (TURBT);  Surgeon: Remi Haggard, MD;  [facility];  Service: Urology;  Laterality: N/A;  [place]  40 YRS AGO  :  Current Facility-Administered Medications  Medication Dose Route Frequency Provider Last Rate Last Admin   0.9 %  sodium chloride infusion   Intravenous Continuous Lang Snow, NP 100 mL/hr at 12/06/20 0321 Rate Change at 12/06/20 0321   acetaminophen (TYLENOL) tablet 650 mg  650 mg Oral Q6H PRN Cherylann Ratel A, DO       Or    acetaminophen (TYLENOL) suppository 650 mg  650 mg Rectal Q6H PRN Marylyn Ishihara, Tyrone A, DO       allopurinol (ZYLOPRIM) tablet 200 mg  200 mg Oral Daily Kyle, Tyrone A, DO       azelastine (ASTELIN) 0.1 % nasal spray 1 spray  1 spray Each Nare Daily Kyle, Tyrone A, DO   1 spray at 12/08/2020 2148   And   fluticasone (FLONASE) 50 MCG/ACT nasal spray 1 spray  1 spray Each Nare Daily Marylyn Ishihara, Tyrone A, DO   1 spray at 12/20/2020 2149   calcium carbonate (TUMS - dosed in mg elemental calcium) chewable tablet 200 mg of elemental calcium  1 tablet Oral TID PRN Cherylann Ratel A, DO       cefdinir (OMNICEF) capsule 300 mg  300 mg Oral BID Marylyn Ishihara, Tyrone A, DO       docusate sodium (COLACE) capsule 100 mg  100 mg Oral BID Kyle, Tyrone A, DO   100 mg at  12/10/2020 2150   famotidine (PEPCID) tablet 20 mg  20 mg Oral BID Marylyn Ishihara, Tyrone A, DO       levothyroxine (SYNTHROID) tablet 125 mcg  125 mcg Oral Daily Kyle, Tyrone A, DO       loratadine (CLARITIN) tablet 10 mg  10 mg Oral Daily Kyle, Tyrone A, DO       magnesium hydroxide (MILK OF MAGNESIA) suspension 30 mL  30 mL Oral Daily PRN Marylyn Ishihara, Tyrone A, DO       morphine 2 MG/ML injection 2 mg  2 mg Intravenous Q2H PRN Marylyn Ishihara, Tyrone A, DO       ondansetron (ZOFRAN) tablet 4 mg  4 mg Oral Q6H PRN Marylyn Ishihara, Tyrone A, DO       Or   ondansetron (ZOFRAN) injection 4 mg  4 mg Intravenous Q6H PRN Marylyn Ishihara, Tyrone A, DO       oxyCODONE (Oxy IR/ROXICODONE) immediate release tablet 5 mg  5 mg Oral Q4H PRN Marylyn Ishihara, Tyrone A, DO       pantoprazole (PROTONIX) EC tablet 40 mg  40 mg Oral Daily Kyle, Tyrone A, DO       predniSONE (DELTASONE) tablet 5 mg  5 mg Oral Daily Kyle, Tyrone A, DO       Tacrolimus ER TB24 1.5 mg  1.5 mg Oral Daily Kyle, Tyrone A, DO       traZODone (DESYREL) tablet 100 mg  100 mg Oral QHS Lang Snow, NP   100 mg at 12/23/2020 2150     Allergies  Allergen Reactions   Digoxin Other (See Comments)    gynecomastia    Phencyclidine Other (See  Comments)    PCP derived antibiotic > Insomnia   Spironolactone Other (See Comments)    Gynecomastia   Oxybutynin Chloride     Other reaction(s): low BP   Lactose Intolerance (Gi) Diarrhea and Other (See Comments)    cramps   Penicillins Other (See Comments)    Cramps in stomach Did it involve swelling of the face/tongue/throat, SOB, or low BP? No Did it involve sudden or severe rash/hives, skin peeling, or any reaction on the inside of your mouth or nose? No Did you need to seek medical attention at a hospital or doctor's office? No When did it last happen?unknown If all above answers are "NO", may proceed with cephalosporin use.      Sulfur Rash  :  Family History  Problem Relation Age of Onset   Hyperthyroidism Mother    Insomnia Mother    Hypertension Father    Depression Father    Insomnia Father   :  Social History   Socioeconomic History   Marital status: Married    Spouse name: Not on file   Number of children: Not on file   Years of education: Not on file   Highest education level: Not on file  Occupational History   Occupation: disabled  Tobacco Use   Smoking status: Never Smoker   Smokeless tobacco: Never Used  Scientific laboratory technician Use: Never used  Substance and Sexual Activity   Alcohol use: No   Drug use: No   Sexual activity: Not on file  Other Topics Concern   Not on file  Social History Narrative   Not on file   Social Determinants of Health   Financial Resource Strain: Not on file  Food Insecurity: Not on file  Transportation Needs: Not on file  Physical Activity: Not on file  Stress:  Not on file  Social Connections: Not on file  Intimate Partner Violence: Not on file  :  Review of Systems: A comprehensive 14 point review of systems was negative except as noted in the HPI.  Exam: Patient Vitals for the past 24 hrs:  BP Temp Temp src Pulse Resp SpO2 Height Weight  12/06/20 0326 134/82 97.9 F (36.6 C)  Oral 97 -- 93 % -- --  12/16/2020 2138 (!) 138/92 98.6 F (37 C) Oral 90 18 95 % -- --  12/06/2020 1742 (!) 142/90 98.2 F (36.8 C) Oral 92 20 96 % -- --  12/22/2020 1322 (!) 139/97 97.9 F (36.6 C) Oral 93 16 96 % -- --  12/22/2020 1321 -- -- -- -- -- -- 6\' 2"  (1.88 m) 102.1 kg    General:  well-nourished in no acute distress.   Eyes:  no scleral icterus.   ENT:  There were no oropharyngeal lesions.   Lymphatics:  Negative cervical, supraclavicular or axillary adenopathy.   Respiratory: lungs were clear bilaterally without wheezing or crackles.   Cardiovascular:  Regular rate and rhythm, S1/S2, without murmur, rub or gallop.  There was no pedal edema.   GI:  abdomen was soft, flat, nontender, nondistended, without organomegaly.    Skin exam was without echymosis, petichae.   Neuro exam was nonfocal. Patient was alert and oriented.  Attention was good.   Language was appropriate.  Mood was normal without depression.  Speech was not pressured.  Thought content was not tangential.     Lab Results  Component Value Date   WBC 18.1 (H) 12/06/2020   HGB 13.5 12/06/2020   HCT 39.5 12/06/2020   PLT 459 (H) 12/06/2020   GLUCOSE 81 12/06/2020   CHOL 174 05/12/2008   TRIG 60 05/12/2008   HDL 31.1 (L) 05/12/2008   LDLDIRECT 152.5 03/02/2007   LDLCALC 131 (H) 05/12/2008   ALT 11 12/06/2020   AST 21 12/06/2020   NA 121 (L) 12/06/2020   K 3.7 12/06/2020   CL 85 (L) 12/06/2020   CREATININE 1.87 (H) 12/06/2020   BUN 30 (H) 12/06/2020   CO2 22 12/06/2020    CT ABDOMEN PELVIS WO CONTRAST  Result Date: 12/27/2020 CLINICAL DATA:  Cancer of unknown primary in this 60 year old male. EXAM: CT CHEST, ABDOMEN AND PELVIS WITHOUT CONTRAST TECHNIQUE: Multidetector CT imaging of the chest, abdomen and pelvis was performed following the standard protocol without IV contrast. COMPARISON:  CT abdomen and pelvis of the same date. Also with CT of December 30th of 2021 FINDINGS: CT CHEST FINDINGS Cardiovascular:  Calcified atheromatous plaque in the thoracic aorta. Heart size is normal following sternotomy for heart transplant by report a [facility] medical center. Three-vessel coronary artery calcification. No pericardial effusion. Calcifications in the LEFT atrium partially seen on previous imaging are of uncertain significance central pulmonary vasculature also with some signs of calcification. Mediastinum/Nodes: Ovoid structure associated with LEFT thyroid 11 mm similar to previous imaging, cervical spine from 2017. Thyroid as described above. Calcifications in the LEFT supraclavicular region may be within venous structures on the LEFT. No axillary lymphadenopathy. Surgical clips in the RIGHT axilla and soft tissue thickening over the LEFT pectoralis musculature similar grossly compared to remote MRI evaluation 2018 and surgical clips seen on numerous priors in the RIGHT axilla. No mediastinal lymphadenopathy. No gross hilar lymphadenopathy. Lungs/Pleura: Small RIGHT of pleural effusion and basilar airspace disease. Small nodule in the superior aspect of the RIGHT middle lobe approximately 3 mm on image  57 of series 5. Biapical scarring. Small nodule in the LEFT upper lobe 6 mm (image 51, series 5) airways are patent. Musculoskeletal: Evidence ir sternotomy without significant bony bridging but with sclerotic margins along the sternotomy line similar to studies dating back to August of 2021 with respect to visualized portions on prior abdomen studies no destructive bone finding or acute bone process. CT ABDOMEN PELVIS FINDINGS Hepatobiliary: 1.9 cm lesion in the RIGHT hepatic lobe near the dome of the RIGHT hemi liver does not display attenuation values of simple cyst is unchanged from the study acquired on the same date at another facility. Additionally a 1.4 cm lesion in the RIGHT hepatic lobe on image 23 of series 2 is unchanged. Sludge in the gallbladder. Tiny low-density foci in the LEFT hepatic lobe without  change 1 on image 19 of series 2 in the lateral segment measuring 5 mm and another in the medial segment of the LEFT hepatic lobe measuring 12 mm on image 21 of series 2 these areas appears similar dating back to August of 2020, lesions in the RIGHT hepatic lobe are new. Pancreas: Normal pancreatic contour without signs of inflammation. Spleen: Post splenectomy. Adrenals/Urinary Tract: Adrenal glands, normal on the LEFT and obscured on the RIGHT by perinephric and Peri adrenal nodularity. Fullness of the RIGHT renal pelvis with nephroureteral stent in place showing soft tissue density with distension of the renal pelvis and calices similar to the study acquired on the same date. Nephrolithiasis also unchanged. Perinephric nodularity along the medial upper pole the LEFT kidney measures 6.4 x 2.7 cm which is within 1-2 mm of its previous measurement when measured by this observer on the prior study. Increased in size when compared to the study of November 30, 2020. Renal cortical scarring on the LEFT with nephrolithiasis in the lower pole unchanged from very recent comparison. Urinary bladder with small amount of gas in the urinary bladder with distal aspect of the stent in place, loop coiled in the urinary bladder. Stomach/Bowel: Scattered colonic diverticulosis without acute gastrointestinal process. Normal appendix. Stool and contrast throughout the colon. Vascular/Lymphatic: Calcified atheromatous plaque in the abdominal aorta without aneurysmal dilation. Retroperitoneal adenopathy unchanged from scan earlier the same day, largest lymph nodes behind the inferior vena cava and in the intra-aortocaval groove and in the upper abdomen, largest on image 41 of series 2 measuring 1.9 cm. Smaller lymph nodes along the LEFT periaortic chain. Postoperative changes in the retroperitoneum associated with previous lymphadenectomy No pelvic lymphadenopathy. Reproductive: Prostate with uro lift device deployment bilaterally  similar to recent priors. Other: Extensive retroperitoneal fascial thickening/stranding extending into the root of the small bowel mesentery, nodular changes associated with this thickening on the RIGHT outlining the renal fascia this crosses the midline where it is without significant nodularity but with fascial thickening and stranding, also tracking into the pelvis. Small amount of fluid in the pelvis. Musculoskeletal: Spinal degenerative changes. No destructive bone process or acute bone finding. IMPRESSION: 1. Retroperitoneal adenopathy and perinephric nodularity with dilation of the RIGHT renal pelvis and collecting systems, filled with what is suspected to represent neoplasm. 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 Differential considerations given heart transplantation and presumed immunosuppression would include posttransplant lymphoproliferative disorder/lymphoma. 2. Dense material in collecting systems may represent a mixture of tumor and or blood and there is profound dilation of collecting systems of the RIGHT kidney. This is unchanged compared to the recent comparison study. 3.  Signs of hepatic involvement with new lesions in the RIGHT hepatic lobe. 4. Ascites with small amount of ascites with very subtle infiltration of fat in the omentum not mentioned above, for instance on image 50 of series 2 raising the question of peritoneal involvement as well. This area is in the range of 2-3 mm. 5. Small RIGHT pleural effusion and basilar airspace disease. 6. Postoperative changes about the chest likely related to prior heart transplantation with surgical clips in the RIGHT axilla and thickening and calcification along the anterior pectoralis musculature not changed since 2018. 7. Calcification in the LEFT atrium also about the aortic root and pulmonary artery and to a lesser degree in the RIGHT atrium likely  reflects changes of vascular anastomotic sites in the setting of heart transplantation. 8. 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 9. Post splenectomy. 10. Aortic atherosclerosis. 11. Small nodule in the LEFT upper lobe 6 mm airways are patent. These results will be called to the ordering clinician or representative by the Radiologist Assistant, and communication documented in the PACS or [employer]. Aortic Atherosclerosis (ICD10-I70.0). Electronically Signed   By: Zetta Bills M.D.   On: 12/12/2020 17:38   DG Chest 1 View  Result Date: 11/29/2020 CLINICAL DATA:  Hypoxia EXAM: CHEST  1 VIEW COMPARISON:  08/03/2020 FINDINGS: Since the prior examination, there has developed right basilar consolidation, possibly infectious in the appropriate clinical setting. Mild focal infiltrate is also noted within the right apex. Left lung is clear. No pneumothorax or pleural effusion. Median sternotomy has been performed. Cardiac size within normal limits. Multiple healed right rib fractures are noted. Multiple surgical clips are seen within the right axilla. IMPRESSION: Interval development of multifocal pulmonary infiltrate within the right lung, more focal within the right lung base, suspicious for atypical infection in the acute setting. Electronically Signed   By: Fidela Salisbury MD   On: 11/29/2020 22:27   DG Chest 2 View  Result Date: 12/21/2020 CLINICAL DATA:  Bladder cancer.  Follow-up multifocal pneumonia. EXAM: CHEST - 2 VIEW COMPARISON:  11/29/2020 FINDINGS: Prior median sternotomy. Heart is normal size. Patchy right lung airspace disease in the right apex and right lower lung. Minimal left base linear atelectasis or scarring. Overall aeration in the right lung slightly improved. No effusions. IMPRESSION: Patchy right lung airspace disease with slight improvement since prior study. Left base atelectasis or scarring.  Electronically Signed   By: Rolm Baptise M.D.   On: 12/09/2020 13:25   CT CHEST WO CONTRAST  Result Date: 12/31/2020 CLINICAL DATA:  Cancer of unknown primary in this 60 year old male. EXAM: CT CHEST, ABDOMEN AND PELVIS WITHOUT CONTRAST TECHNIQUE: Multidetector CT imaging of the chest, abdomen and pelvis was performed following the standard protocol without IV contrast. COMPARISON:  CT abdomen and pelvis of the same date. Also with CT of December 30th of 2021 FINDINGS: CT CHEST FINDINGS Cardiovascular: Calcified atheromatous plaque in the thoracic aorta. Heart size is normal following sternotomy for heart transplant by report a [facility] medical center. Three-vessel coronary artery calcification. No pericardial effusion. Calcifications in the LEFT atrium partially seen on previous imaging are of uncertain significance central pulmonary vasculature also with some signs of calcification. Mediastinum/Nodes: Ovoid structure associated with LEFT thyroid 11 mm similar to previous imaging, cervical spine from 2017. Thyroid as described above. Calcifications in the LEFT supraclavicular region may be within venous structures on the LEFT. No axillary lymphadenopathy. Surgical clips in  the RIGHT axilla and soft tissue thickening over the LEFT pectoralis musculature similar grossly compared to remote MRI evaluation 2018 and surgical clips seen on numerous priors in the RIGHT axilla. No mediastinal lymphadenopathy. No gross hilar lymphadenopathy. Lungs/Pleura: Small RIGHT of pleural effusion and basilar airspace disease. Small nodule in the superior aspect of the RIGHT middle lobe approximately 3 mm on image 57 of series 5. Biapical scarring. Small nodule in the LEFT upper lobe 6 mm (image 51, series 5) airways are patent. Musculoskeletal: Evidence ir sternotomy without significant bony bridging but with sclerotic margins along the sternotomy line similar to studies dating back to August of 2021 with respect to  visualized portions on prior abdomen studies no destructive bone finding or acute bone process. CT ABDOMEN PELVIS FINDINGS Hepatobiliary: 1.9 cm lesion in the RIGHT hepatic lobe near the dome of the RIGHT hemi liver does not display attenuation values of simple cyst is unchanged from the study acquired on the same date at another facility. Additionally a 1.4 cm lesion in the RIGHT hepatic lobe on image 23 of series 2 is unchanged. Sludge in the gallbladder. Tiny low-density foci in the LEFT hepatic lobe without change 1 on image 19 of series 2 in the lateral segment measuring 5 mm and another in the medial segment of the LEFT hepatic lobe measuring 12 mm on image 21 of series 2 these areas appears similar dating back to August of 2020, lesions in the RIGHT hepatic lobe are new. Pancreas: Normal pancreatic contour without signs of inflammation. Spleen: Post splenectomy. Adrenals/Urinary Tract: Adrenal glands, normal on the LEFT and obscured on the RIGHT by perinephric and Peri adrenal nodularity. Fullness of the RIGHT renal pelvis with nephroureteral stent in place showing soft tissue density with distension of the renal pelvis and calices similar to the study acquired on the same date. Nephrolithiasis also unchanged. Perinephric nodularity along the medial upper pole the LEFT kidney measures 6.4 x 2.7 cm which is within 1-2 mm of its previous measurement when measured by this observer on the prior study. Increased in size when compared to the study of November 30, 2020. Renal cortical scarring on the LEFT with nephrolithiasis in the lower pole unchanged from very recent comparison. Urinary bladder with small amount of gas in the urinary bladder with distal aspect of the stent in place, loop coiled in the urinary bladder. Stomach/Bowel: Scattered colonic diverticulosis without acute gastrointestinal process. Normal appendix. Stool and contrast throughout the colon. Vascular/Lymphatic: Calcified atheromatous plaque in  the abdominal aorta without aneurysmal dilation. Retroperitoneal adenopathy unchanged from scan earlier the same day, largest lymph nodes behind the inferior vena cava and in the intra-aortocaval groove and in the upper abdomen, largest on image 41 of series 2 measuring 1.9 cm. Smaller lymph nodes along the LEFT periaortic chain. Postoperative changes in the retroperitoneum associated with previous lymphadenectomy No pelvic lymphadenopathy. Reproductive: Prostate with uro lift device deployment bilaterally similar to recent priors. Other: Extensive retroperitoneal fascial thickening/stranding extending into the root of the small bowel mesentery, nodular changes associated with this thickening on the RIGHT outlining the renal fascia this crosses the midline where it is without significant nodularity but with fascial thickening and stranding, also tracking into the pelvis. Small amount of fluid in the pelvis. Musculoskeletal: Spinal degenerative changes. No destructive bone process or acute bone finding. IMPRESSION: 1. Retroperitoneal adenopathy and perinephric nodularity with dilation of the RIGHT renal pelvis and collecting systems, filled with what is suspected to represent neoplasm. 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 Differential considerations given heart transplantation and presumed immunosuppression would include posttransplant lymphoproliferative disorder/lymphoma. 2. Dense material in collecting systems may represent a mixture of tumor and or blood and there is profound dilation of collecting systems of the RIGHT kidney. This is unchanged compared to the recent comparison study. 3. Signs of hepatic involvement with new lesions in the RIGHT hepatic lobe. 4. Ascites with small amount of ascites with very subtle infiltration of fat in the omentum not mentioned above, for instance on image 50 of series 2  raising the question of peritoneal involvement as well. This area is in the range of 2-3 mm. 5. Small RIGHT pleural effusion and basilar airspace disease. 6. Postoperative changes about the chest likely related to prior heart transplantation with surgical clips in the RIGHT axilla and thickening and calcification along the anterior pectoralis musculature not changed since 2018. 7. Calcification in the LEFT atrium also about the aortic root and pulmonary artery and to a lesser degree in the RIGHT atrium likely reflects changes of vascular anastomotic sites in the setting of heart transplantation. 8. 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 9. Post splenectomy. 10. Aortic atherosclerosis. 11. Small nodule in the LEFT upper lobe 6 mm airways are patent. These results will be called to the ordering clinician or representative by the Radiologist Assistant, and communication documented in the PACS or [employer]. Aortic Atherosclerosis (ICD10-I70.0). Electronically Signed   By: Zetta Bills M.D.   On: 12/06/2020 17:38   NM Pulmonary Perfusion  Result Date: 12/16/2020 CLINICAL DATA:  Respiratory failure EXAM: NUCLEAR MEDICINE PERFUSION LUNG SCAN TECHNIQUE: Perfusion images were obtained in multiple projections after intravenous injection of radiopharmaceutical. Ventilation scans intentionally deferred if perfusion scan and chest x-ray adequate for interpretation during COVID 19 epidemic. RADIOPHARMACEUTICALS:  4.1 mCi Tc-9m MAA IV COMPARISON:  Chest x-ray 12/17/2020 FINDINGS: Slightly heterogeneous distribution of radiotracer within the lung fields. Multiple small bilateral wedge-shaped perfusion defects. No corresponding radiographic abnormality. No large mismatched segmental perfusion defect. IMPRESSION: Intermediate probability for pulmonary embolism. Electronically Signed   By: Davina Poke D.O.   On: 12/29/2020 13:30   DG  C-Arm 1-60 Min-No Report  Result Date: 11/29/2020 Fluoroscopy was utilized by the requesting physician.  No radiographic interpretation.   ECHOCARDIOGRAM COMPLETE  Result Date: 11/30/2020    ECHOCARDIOGRAM REPORT   Patient Name:   Glenn Gonzalez Dubreuil Date of Exam: 11/30/2020 Medical Rec #:  [id number]         Height:       74.0 in Accession #:    [id number]        Weight:       221.0 lb Date of Birth:  August 29, 1961        BSA:          2.268 m Patient Age:    52 years          BP:           103/59 mmHg Patient Gender: M                 HR:           94 bpm. Exam Location:  Inpatient Procedure: 2D Echo, Cardiac Doppler, Color Doppler and Intracardiac            Opacification Agent Indications:    Dyspnea R06.00  History:  Patient has prior history of Echocardiogram examinations, most                 recent 05/12/2017. Arrythmias:Atrial Fibrillation; Risk                 Factors:Non-Smoker. GERD. 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  Sonographer:    Vickie Epley RDCS Referring Phys: [id number] [facility]  Sonographer Comments: Suboptimal apical window and suboptimal subcostal window. Pt unable to turn on side due to herniated discs. IMPRESSIONS  1. Left ventricular ejection fraction, by estimation, is 60 to 65%. The left ventricle has normal function. The left ventricle has no regional wall motion abnormalities. Left ventricular diastolic parameters were normal.  2. Right ventricular systolic function is normal. The right ventricular size is normal. There is normal pulmonary artery systolic pressure. The estimated right ventricular systolic pressure is 38.4 mmHg.  3. The mitral valve is normal in structure. No evidence of mitral valve regurgitation. No evidence of mitral stenosis.  4. The aortic valve is normal in structure. Aortic valve regurgitation is not visualized. No aortic stenosis is present.  5. The inferior vena cava is normal in size with greater than 50% respiratory variability, suggesting right  atrial pressure of 3 mmHg. FINDINGS  Left Ventricle: Left ventricular ejection fraction, by estimation, is 60 to 65%. The left ventricle has normal function. The left ventricle has no regional wall motion abnormalities. Definity contrast agent was given IV to delineate the left ventricular  endocardial borders. The left ventricular internal cavity size was normal in size. There is no left ventricular hypertrophy. Left ventricular diastolic parameters were normal. Normal left ventricular filling pressure. Right Ventricle: The right ventricular size is normal. No increase in right ventricular wall thickness. Right ventricular systolic function is normal. There is normal pulmonary artery systolic pressure. The tricuspid regurgitant velocity is 2.39 m/s, and  with an assumed right atrial pressure of 3 mmHg, the estimated right ventricular systolic pressure is 53.6 mmHg. Left Atrium: Left atrial size was normal in size. Right Atrium: Right atrial size was normal in size. Pericardium: There is no evidence of pericardial effusion. Mitral Valve: The mitral valve is normal in structure. No evidence of mitral valve regurgitation. No evidence of mitral valve stenosis. Tricuspid Valve: The tricuspid valve is normal in structure. Tricuspid valve regurgitation is mild . No evidence of tricuspid stenosis. Aortic Valve: The aortic valve is normal in structure. Aortic valve regurgitation is not visualized. No aortic stenosis is present. Pulmonic Valve: The pulmonic valve was normal in structure. Pulmonic valve regurgitation is not visualized. No evidence of pulmonic stenosis. Aorta: The aortic root is normal in size and structure. Venous: The inferior vena cava is normal in size with greater than 50% respiratory variability, suggesting right atrial pressure of 3 mmHg. IAS/Shunts: No atrial level shunt detected by color flow Doppler.  LEFT VENTRICLE PLAX 2D LVIDd:         4.40 cm     Diastology LVIDs:         3.20 cm     LV e'  medial:    10.00 cm/s LV PW:         0.90 cm     LV E/e' medial:  7.5 LV IVS:        0.90 cm     LV e' lateral:   11.30 cm/s LVOT diam:     2.50 cm     LV E/e' lateral: 6.7 LV SV:  48 LV SV Index:   21 LVOT Area:     4.91 cm  LV Volumes (MOD) LV vol d, MOD A4C: 80.2 ml LV vol s, MOD A4C: 30.4 ml LV SV MOD A4C:     80.2 ml LEFT ATRIUM           Index LA diam:      2.70 cm 1.19 cm/m LA Vol (A4C): 16.3 ml 7.19 ml/m  AORTIC VALVE LVOT Vmax:   50.20 cm/s LVOT Vmean:  36.700 cm/s LVOT VTI:    0.099 m  AORTA Ao Root diam: 3.40 cm MITRAL VALVE               TRICUSPID VALVE MV Area (PHT): 5.38 cm    TR Peak grad:   22.8 mmHg MV Decel Time: 141 msec    TR Vmax:        239.00 cm/s MV E velocity: 75.40 cm/s MV A velocity: 36.00 cm/s  SHUNTS MV E/A ratio:  2.09        Systemic VTI:  0.10 m                            Systemic Diam: 2.50 cm Dani Gobble Croitoru MD Electronically signed by Sanda Klein MD Signature Date/Time: 11/30/2020/12:30:40 PM    Final    VAS [place] LOWER EXTREMITY VENOUS (DVT)  Result Date: 12/03/2020  Lower Venous DVT Study Indications: Pain.  Comparison Study: Previous 12/2019 Performing Technologist: Vonzell Schlatter RVT  Examination Guidelines: A complete evaluation includes B-mode imaging, spectral Doppler, color Doppler, and power Doppler as needed of all accessible portions of each vessel. Bilateral testing is considered an integral part of a complete examination. Limited examinations for reoccurring indications may be performed as noted. The reflux portion of the exam is performed with the patient in reverse Trendelenburg.  +---------+---------------+---------+-----------+----------+--------------+  RIGHT     Compressibility Phasicity Spontaneity Properties Thrombus Aging  +---------+---------------+---------+-----------+----------+--------------+  CFV       Full            Yes       Yes                                    +---------+---------------+---------+-----------+----------+--------------+  SFJ        Full                                                             +---------+---------------+---------+-----------+----------+--------------+  FV Prox   Full                                                             +---------+---------------+---------+-----------+----------+--------------+  FV Mid    Full                                                             +---------+---------------+---------+-----------+----------+--------------+  FV Distal Full                                                             +---------+---------------+---------+-----------+----------+--------------+  PFV       Full                                                             +---------+---------------+---------+-----------+----------+--------------+  POP       Full            Yes       Yes                                    +---------+---------------+---------+-----------+----------+--------------+  PTV       Full                                                             +---------+---------------+---------+-----------+----------+--------------+  PERO      Full                                                             +---------+---------------+---------+-----------+----------+--------------+   +---------+---------------+---------+-----------+----------+--------------+  LEFT      Compressibility Phasicity Spontaneity Properties Thrombus Aging  +---------+---------------+---------+-----------+----------+--------------+  CFV       Full            Yes       Yes                                    +---------+---------------+---------+-----------+----------+--------------+  SFJ       Full                                                             +---------+---------------+---------+-----------+----------+--------------+  FV Prox   Full                                                             +---------+---------------+---------+-----------+----------+--------------+  FV Mid    Full                                                              +---------+---------------+---------+-----------+----------+--------------+  FV Distal Full                                                             +---------+---------------+---------+-----------+----------+--------------+  PFV       Full                                                             +---------+---------------+---------+-----------+----------+--------------+  POP       Full            Yes       Yes                                    +---------+---------------+---------+-----------+----------+--------------+  PTV       Full                                                             +---------+---------------+---------+-----------+----------+--------------+  PERO      Full                                                             +---------+---------------+---------+-----------+----------+--------------+     Summary: RIGHT: - There is no evidence of deep vein thrombosis in the lower extremity.  - No cystic structure found in the popliteal fossa.  LEFT: - There is no evidence of deep vein thrombosis in the lower extremity.  - No cystic structure found in the popliteal fossa.  *See table(s) above for measurements and observations. Electronically signed by Ruta Hinds MD on 12/03/2020 at 12:15:41 PM.    Final      CT ABDOMEN PELVIS WO CONTRAST  Result Date: 12/10/2020 CLINICAL DATA:  Cancer of unknown primary in this 60 year old male. EXAM: CT CHEST, ABDOMEN AND PELVIS WITHOUT CONTRAST TECHNIQUE: Multidetector CT imaging of the chest, abdomen and pelvis was performed following the standard protocol without IV contrast. COMPARISON:  CT abdomen and pelvis of the same date. Also with CT of December 30th of 2021 FINDINGS: CT CHEST FINDINGS Cardiovascular: Calcified atheromatous plaque in the thoracic aorta. Heart size is normal following sternotomy for heart transplant by report a [facility]. Three-vessel coronary artery calcification. No pericardial  effusion. Calcifications in the LEFT atrium partially seen on previous imaging are of uncertain significance central pulmonary vasculature also with some signs of calcification. Mediastinum/Nodes: Ovoid structure associated with LEFT thyroid 11 mm similar to previous imaging, cervical spine from 2017. Thyroid as described above. Calcifications in the LEFT supraclavicular region may be within venous structures on the LEFT. No axillary lymphadenopathy. Surgical clips in the RIGHT axilla and soft tissue thickening over the LEFT pectoralis musculature similar grossly compared to  remote MRI evaluation 2018 and surgical clips seen on numerous priors in the RIGHT axilla. No mediastinal lymphadenopathy. No gross hilar lymphadenopathy. Lungs/Pleura: Small RIGHT of pleural effusion and basilar airspace disease. Small nodule in the superior aspect of the RIGHT middle lobe approximately 3 mm on image 57 of series 5. Biapical scarring. Small nodule in the LEFT upper lobe 6 mm (image 51, series 5) airways are patent. Musculoskeletal: Evidence ir sternotomy without significant bony bridging but with sclerotic margins along the sternotomy line similar to studies dating back to August of 2021 with respect to visualized portions on prior abdomen studies no destructive bone finding or acute bone process. CT ABDOMEN PELVIS FINDINGS Hepatobiliary: 1.9 cm lesion in the RIGHT hepatic lobe near the dome of the RIGHT hemi liver does not display attenuation values of simple cyst is unchanged from the study acquired on the same date at another facility. Additionally a 1.4 cm lesion in the RIGHT hepatic lobe on image 23 of series 2 is unchanged. Sludge in the gallbladder. Tiny low-density foci in the LEFT hepatic lobe without change 1 on image 19 of series 2 in the lateral segment measuring 5 mm and another in the medial segment of the LEFT hepatic lobe measuring 12 mm on image 21 of series 2 these areas appears similar dating back to August  of 2020, lesions in the RIGHT hepatic lobe are new. Pancreas: Normal pancreatic contour without signs of inflammation. Spleen: Post splenectomy. Adrenals/Urinary Tract: Adrenal glands, normal on the LEFT and obscured on the RIGHT by perinephric and Peri adrenal nodularity. Fullness of the RIGHT renal pelvis with nephroureteral stent in place showing soft tissue density with distension of the renal pelvis and calices similar to the study acquired on the same date. Nephrolithiasis also unchanged. Perinephric nodularity along the medial upper pole the LEFT kidney measures 6.4 x 2.7 cm which is within 1-2 mm of its previous measurement when measured by this observer on the prior study. Increased in size when compared to the study of November 30, 2020. Renal cortical scarring on the LEFT with nephrolithiasis in the lower pole unchanged from very recent comparison. Urinary bladder with small amount of gas in the urinary bladder with distal aspect of the stent in place, loop coiled in the urinary bladder. Stomach/Bowel: Scattered colonic diverticulosis without acute gastrointestinal process. Normal appendix. Stool and contrast throughout the colon. Vascular/Lymphatic: Calcified atheromatous plaque in the abdominal aorta without aneurysmal dilation. Retroperitoneal adenopathy unchanged from scan earlier the same day, largest lymph nodes behind the inferior vena cava and in the intra-aortocaval groove and in the upper abdomen, largest on image 41 of series 2 measuring 1.9 cm. Smaller lymph nodes along the LEFT periaortic chain. Postoperative changes in the retroperitoneum associated with previous lymphadenectomy No pelvic lymphadenopathy. Reproductive: Prostate with uro lift device deployment bilaterally similar to recent priors. Other: Extensive retroperitoneal fascial thickening/stranding extending into the root of the small bowel mesentery, nodular changes associated with this thickening on the RIGHT outlining the renal  fascia this crosses the midline where it is without significant nodularity but with fascial thickening and stranding, also tracking into the pelvis. Small amount of fluid in the pelvis. Musculoskeletal: Spinal degenerative changes. No destructive bone process or acute bone finding. IMPRESSION: 1. Retroperitoneal adenopathy and perinephric nodularity with dilation of the RIGHT renal pelvis and collecting systems, filled with what is suspected to represent neoplasm. 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 Differential considerations  given heart transplantation and presumed immunosuppression would include posttransplant lymphoproliferative disorder/lymphoma. 2. Dense material in collecting systems may represent a mixture of tumor and or blood and there is profound dilation of collecting systems of the RIGHT kidney. This is unchanged compared to the recent comparison study. 3. Signs of hepatic involvement with new lesions in the RIGHT hepatic lobe. 4. Ascites with small amount of ascites with very subtle infiltration of fat in the omentum not mentioned above, for instance on image 50 of series 2 raising the question of peritoneal involvement as well. This area is in the range of 2-3 mm. 5. Small RIGHT pleural effusion and basilar airspace disease. 6. Postoperative changes about the chest likely related to prior heart transplantation with surgical clips in the RIGHT axilla and thickening and calcification along the anterior pectoralis musculature not changed since 2018. 7. Calcification in the LEFT atrium also about the aortic root and pulmonary artery and to a lesser degree in the RIGHT atrium likely reflects changes of vascular anastomotic sites in the setting of heart transplantation. 8. 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 9. Post  splenectomy. 10. Aortic atherosclerosis. 11. Small nodule in the LEFT upper lobe 6 mm airways are patent. These results will be called to the ordering clinician or representative by the Radiologist Assistant, and communication documented in the PACS or [employer]. Aortic Atherosclerosis (ICD10-I70.0). Electronically Signed   By: Zetta Bills M.D.   On: 12/12/2020 17:38   DG Chest 1 View  Result Date: 11/29/2020 CLINICAL DATA:  Hypoxia EXAM: CHEST  1 VIEW COMPARISON:  08/03/2020 FINDINGS: Since the prior examination, there has developed right basilar consolidation, possibly infectious in the appropriate clinical setting. Mild focal infiltrate is also noted within the right apex. Left lung is clear. No pneumothorax or pleural effusion. Median sternotomy has been performed. Cardiac size within normal limits. Multiple healed right rib fractures are noted. Multiple surgical clips are seen within the right axilla. IMPRESSION: Interval development of multifocal pulmonary infiltrate within the right lung, more focal within the right lung base, suspicious for atypical infection in the acute setting. Electronically Signed   By: Fidela Salisbury MD   On: 11/29/2020 22:27   DG Chest 2 View  Result Date: 12/28/2020 CLINICAL DATA:  Bladder cancer.  Follow-up multifocal pneumonia. EXAM: CHEST - 2 VIEW COMPARISON:  11/29/2020 FINDINGS: Prior median sternotomy. Heart is normal size. Patchy right lung airspace disease in the right apex and right lower lung. Minimal left base linear atelectasis or scarring. Overall aeration in the right lung slightly improved. No effusions. IMPRESSION: Patchy right lung airspace disease with slight improvement since prior study. Left base atelectasis or scarring. Electronically Signed   By: Rolm Baptise M.D.   On: 12/13/2020 13:25   CT CHEST WO CONTRAST  Result Date: 12/16/2020 CLINICAL DATA:  Cancer of unknown primary in this 60 year old male. EXAM: CT CHEST, ABDOMEN AND PELVIS  WITHOUT CONTRAST TECHNIQUE: Multidetector CT imaging of the chest, abdomen and pelvis was performed following the standard protocol without IV contrast. COMPARISON:  CT abdomen and pelvis of the same date. Also with CT of December 30th of 2021 FINDINGS: CT CHEST FINDINGS Cardiovascular: Calcified atheromatous plaque in the thoracic aorta. Heart size is normal following sternotomy for heart transplant by report a [facility]. Three-vessel coronary artery calcification. No pericardial effusion. Calcifications in the LEFT atrium partially seen on previous imaging are of uncertain significance central pulmonary  vasculature also with some signs of calcification. Mediastinum/Nodes: Ovoid structure associated with LEFT thyroid 11 mm similar to previous imaging, cervical spine from 2017. Thyroid as described above. Calcifications in the LEFT supraclavicular region may be within venous structures on the LEFT. No axillary lymphadenopathy. Surgical clips in the RIGHT axilla and soft tissue thickening over the LEFT pectoralis musculature similar grossly compared to remote MRI evaluation 2018 and surgical clips seen on numerous priors in the RIGHT axilla. No mediastinal lymphadenopathy. No gross hilar lymphadenopathy. Lungs/Pleura: Small RIGHT of pleural effusion and basilar airspace disease. Small nodule in the superior aspect of the RIGHT middle lobe approximately 3 mm on image 57 of series 5. Biapical scarring. Small nodule in the LEFT upper lobe 6 mm (image 51, series 5) airways are patent. Musculoskeletal: Evidence ir sternotomy without significant bony bridging but with sclerotic margins along the sternotomy line similar to studies dating back to August of 2021 with respect to visualized portions on prior abdomen studies no destructive bone finding or acute bone process. CT ABDOMEN PELVIS FINDINGS Hepatobiliary: 1.9 cm lesion in the RIGHT hepatic lobe near the dome of the RIGHT hemi liver does not  display attenuation values of simple cyst is unchanged from the study acquired on the same date at another facility. Additionally a 1.4 cm lesion in the RIGHT hepatic lobe on image 23 of series 2 is unchanged. Sludge in the gallbladder. Tiny low-density foci in the LEFT hepatic lobe without change 1 on image 19 of series 2 in the lateral segment measuring 5 mm and another in the medial segment of the LEFT hepatic lobe measuring 12 mm on image 21 of series 2 these areas appears similar dating back to August of 2020, lesions in the RIGHT hepatic lobe are new. Pancreas: Normal pancreatic contour without signs of inflammation. Spleen: Post splenectomy. Adrenals/Urinary Tract: Adrenal glands, normal on the LEFT and obscured on the RIGHT by perinephric and Peri adrenal nodularity. Fullness of the RIGHT renal pelvis with nephroureteral stent in place showing soft tissue density with distension of the renal pelvis and calices similar to the study acquired on the same date. Nephrolithiasis also unchanged. Perinephric nodularity along the medial upper pole the LEFT kidney measures 6.4 x 2.7 cm which is within 1-2 mm of its previous measurement when measured by this observer on the prior study. Increased in size when compared to the study of November 30, 2020. Renal cortical scarring on the LEFT with nephrolithiasis in the lower pole unchanged from very recent comparison. Urinary bladder with small amount of gas in the urinary bladder with distal aspect of the stent in place, loop coiled in the urinary bladder. Stomach/Bowel: Scattered colonic diverticulosis without acute gastrointestinal process. Normal appendix. Stool and contrast throughout the colon. Vascular/Lymphatic: Calcified atheromatous plaque in the abdominal aorta without aneurysmal dilation. Retroperitoneal adenopathy unchanged from scan earlier the same day, largest lymph nodes behind the inferior vena cava and in the intra-aortocaval groove and in the upper  abdomen, largest on image 41 of series 2 measuring 1.9 cm. Smaller lymph nodes along the LEFT periaortic chain. Postoperative changes in the retroperitoneum associated with previous lymphadenectomy No pelvic lymphadenopathy. Reproductive: Prostate with uro lift device deployment bilaterally similar to recent priors. Other: Extensive retroperitoneal fascial thickening/stranding extending into the root of the small bowel mesentery, nodular changes associated with this thickening on the RIGHT outlining the renal fascia this crosses the midline where it is without significant nodularity but with fascial thickening and stranding, also tracking into the pelvis.  Small amount of fluid in the pelvis. Musculoskeletal: Spinal degenerative changes. No destructive bone process or acute bone finding. IMPRESSION: 1. Retroperitoneal adenopathy and perinephric nodularity with dilation of the RIGHT renal pelvis and collecting systems, filled with what is suspected to represent neoplasm. 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 Differential considerations given heart transplantation and presumed immunosuppression would include posttransplant lymphoproliferative disorder/lymphoma. 2. Dense material in collecting systems may represent a mixture of tumor and or blood and there is profound dilation of collecting systems of the RIGHT kidney. This is unchanged compared to the recent comparison study. 3. Signs of hepatic involvement with new lesions in the RIGHT hepatic lobe. 4. Ascites with small amount of ascites with very subtle infiltration of fat in the omentum not mentioned above, for instance on image 50 of series 2 raising the question of peritoneal involvement as well. This area is in the range of 2-3 mm. 5. Small RIGHT pleural effusion and basilar airspace disease. 6. Postoperative changes about the chest likely related to prior  heart transplantation with surgical clips in the RIGHT axilla and thickening and calcification along the anterior pectoralis musculature not changed since 2018. 7. Calcification in the LEFT atrium also about the aortic root and pulmonary artery and to a lesser degree in the RIGHT atrium likely reflects changes of vascular anastomotic sites in the setting of heart transplantation. 8. 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 9. Post splenectomy. 10. Aortic atherosclerosis. 11. Small nodule in the LEFT upper lobe 6 mm airways are patent. These results will be called to the ordering clinician or representative by the Radiologist Assistant, and communication documented in the PACS or [employer]. Aortic Atherosclerosis (ICD10-I70.0). Electronically Signed   By: Zetta Bills M.D.   On: 12/04/2020 17:38   NM Pulmonary Perfusion  Result Date: 12/19/2020 CLINICAL DATA:  Respiratory failure EXAM: NUCLEAR MEDICINE PERFUSION LUNG SCAN TECHNIQUE: Perfusion images were obtained in multiple projections after intravenous injection of radiopharmaceutical. Ventilation scans intentionally deferred if perfusion scan and chest x-ray adequate for interpretation during COVID 19 epidemic. RADIOPHARMACEUTICALS:  4.1 mCi Tc-78m MAA IV COMPARISON:  Chest x-ray 12/24/2020 FINDINGS: Slightly heterogeneous distribution of radiotracer within the lung fields. Multiple small bilateral wedge-shaped perfusion defects. No corresponding radiographic abnormality. No large mismatched segmental perfusion defect. IMPRESSION: Intermediate probability for pulmonary embolism. Electronically Signed   By: Davina Poke D.O.   On: 12/25/2020 13:30   DG C-Arm 1-60 Min-No Report  Result Date: 11/29/2020 Fluoroscopy was utilized by the requesting physician.  No radiographic interpretation.   ECHOCARDIOGRAM COMPLETE  Result Date: 11/30/2020    ECHOCARDIOGRAM REPORT    Patient Name:   ASHFORD CLOUSE Boyte Date of Exam: 11/30/2020 Medical Rec #:  [id number]         Height:       74.0 in Accession #:    [id number]        Weight:       221.0 lb Date of Birth:  [birth year]        BSA:          2.268 m Patient Age:    89 years          BP:           103/59 mmHg Patient Gender: M                 HR:  94 bpm. Exam Location:  Inpatient Procedure: 2D Echo, Cardiac Doppler, Color Doppler and Intracardiac            Opacification Agent Indications:    Dyspnea R06.00  History:        Patient has prior history of Echocardiogram examinations, most                 recent 05/12/2017. Arrythmias:Atrial Fibrillation; Risk                 Factors:Non-Smoker. GERD. 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  Sonographer:    Vickie Epley RDCS Referring Phys: [id number] [facility]  Sonographer Comments: Suboptimal apical window and suboptimal subcostal window. Pt unable to turn on side due to herniated discs. IMPRESSIONS  1. Left ventricular ejection fraction, by estimation, is 60 to 65%. The left ventricle has normal function. The left ventricle has no regional wall motion abnormalities. Left ventricular diastolic parameters were normal.  2. Right ventricular systolic function is normal. The right ventricular size is normal. There is normal pulmonary artery systolic pressure. The estimated right ventricular systolic pressure is 81.8 mmHg.  3. The mitral valve is normal in structure. No evidence of mitral valve regurgitation. No evidence of mitral stenosis.  4. The aortic valve is normal in structure. Aortic valve regurgitation is not visualized. No aortic stenosis is present.  5. The inferior vena cava is normal in size with greater than 50% respiratory variability, suggesting right atrial pressure of 3 mmHg. FINDINGS  Left Ventricle: Left ventricular ejection fraction, by estimation, is 60 to 65%. The left ventricle has normal function. The left ventricle has no regional wall motion abnormalities.  Definity contrast agent was given IV to delineate the left ventricular  endocardial borders. The left ventricular internal cavity size was normal in size. There is no left ventricular hypertrophy. Left ventricular diastolic parameters were normal. Normal left ventricular filling pressure. Right Ventricle: The right ventricular size is normal. No increase in right ventricular wall thickness. Right ventricular systolic function is normal. There is normal pulmonary artery systolic pressure. The tricuspid regurgitant velocity is 2.39 m/s, and  with an assumed right atrial pressure of 3 mmHg, the estimated right ventricular systolic pressure is 29.9 mmHg. Left Atrium: Left atrial size was normal in size. Right Atrium: Right atrial size was normal in size. Pericardium: There is no evidence of pericardial effusion. Mitral Valve: The mitral valve is normal in structure. No evidence of mitral valve regurgitation. No evidence of mitral valve stenosis. Tricuspid Valve: The tricuspid valve is normal in structure. Tricuspid valve regurgitation is mild . No evidence of tricuspid stenosis. Aortic Valve: The aortic valve is normal in structure. Aortic valve regurgitation is not visualized. No aortic stenosis is present. Pulmonic Valve: The pulmonic valve was normal in structure. Pulmonic valve regurgitation is not visualized. No evidence of pulmonic stenosis. Aorta: The aortic root is normal in size and structure. Venous: The inferior vena cava is normal in size with greater than 50% respiratory variability, suggesting right atrial pressure of 3 mmHg. IAS/Shunts: No atrial level shunt detected by color flow Doppler.  LEFT VENTRICLE PLAX 2D LVIDd:         4.40 cm     Diastology LVIDs:         3.20 cm     LV e' medial:    10.00 cm/s LV PW:         0.90 cm     LV E/e' medial:  7.5 LV IVS:  0.90 cm     LV e' lateral:   11.30 cm/s LVOT diam:     2.50 cm     LV E/e' lateral: 6.7 LV SV:         48 LV SV Index:   21 LVOT Area:      4.91 cm  LV Volumes (MOD) LV vol d, MOD A4C: 80.2 ml LV vol s, MOD A4C: 30.4 ml LV SV MOD A4C:     80.2 ml LEFT ATRIUM           Index LA diam:      2.70 cm 1.19 cm/m LA Vol (A4C): 16.3 ml 7.19 ml/m  AORTIC VALVE LVOT Vmax:   50.20 cm/s LVOT Vmean:  36.700 cm/s LVOT VTI:    0.099 m  AORTA Ao Root diam: 3.40 cm MITRAL VALVE               TRICUSPID VALVE MV Area (PHT): 5.38 cm    TR Peak grad:   22.8 mmHg MV Decel Time: 141 msec    TR Vmax:        239.00 cm/s MV E velocity: 75.40 cm/s MV A velocity: 36.00 cm/s  SHUNTS MV E/A ratio:  2.09        Systemic VTI:  0.10 m                            Systemic Diam: 2.50 cm Dani Gobble Croitoru MD Electronically signed by Sanda Klein MD Signature Date/Time: 11/30/2020/12:30:40 PM    Final    VAS [place] LOWER EXTREMITY VENOUS (DVT)  Result Date: 12/03/2020  Lower Venous DVT Study Indications: Pain.  Comparison Study: Previous 12/2019 Performing Technologist: Vonzell Schlatter RVT  Examination Guidelines: A complete evaluation includes B-mode imaging, spectral Doppler, color Doppler, and power Doppler as needed of all accessible portions of each vessel. Bilateral testing is considered an integral part of a complete examination. Limited examinations for reoccurring indications may be performed as noted. The reflux portion of the exam is performed with the patient in reverse Trendelenburg.  +---------+---------------+---------+-----------+----------+--------------+  RIGHT     Compressibility Phasicity Spontaneity Properties Thrombus Aging  +---------+---------------+---------+-----------+----------+--------------+  CFV       Full            Yes       Yes                                    +---------+---------------+---------+-----------+----------+--------------+  SFJ       Full                                                             +---------+---------------+---------+-----------+----------+--------------+  FV Prox   Full                                                              +---------+---------------+---------+-----------+----------+--------------+  FV Mid    Full                                                             +---------+---------------+---------+-----------+----------+--------------+  FV Distal Full                                                             +---------+---------------+---------+-----------+----------+--------------+  PFV       Full                                                             +---------+---------------+---------+-----------+----------+--------------+  POP       Full            Yes       Yes                                    +---------+---------------+---------+-----------+----------+--------------+  PTV       Full                                                             +---------+---------------+---------+-----------+----------+--------------+  PERO      Full                                                             +---------+---------------+---------+-----------+----------+--------------+   +---------+---------------+---------+-----------+----------+--------------+  LEFT      Compressibility Phasicity Spontaneity Properties Thrombus Aging  +---------+---------------+---------+-----------+----------+--------------+  CFV       Full            Yes       Yes                                    +---------+---------------+---------+-----------+----------+--------------+  SFJ       Full                                                             +---------+---------------+---------+-----------+----------+--------------+  FV Prox   Full                                                             +---------+---------------+---------+-----------+----------+--------------+  FV Mid    Full                                                             +---------+---------------+---------+-----------+----------+--------------+  FV Distal Full                                                              +---------+---------------+---------+-----------+----------+--------------+  PFV       Full                                                             +---------+---------------+---------+-----------+----------+--------------+  POP       Full            Yes       Yes                                    +---------+---------------+---------+-----------+----------+--------------+  PTV       Full                                                             +---------+---------------+---------+-----------+----------+--------------+  PERO      Full                                                             +---------+---------------+---------+-----------+----------+--------------+     Summary: RIGHT: - There is no evidence of deep vein thrombosis in the lower extremity.  - No cystic structure found in the popliteal fossa.  LEFT: - There is no evidence of deep vein thrombosis in the lower extremity.  - No cystic structure found in the popliteal fossa.  *See table(s) above for measurements and observations. Electronically signed by Ruta Hinds MD on 12/03/2020 at 12:15:41 PM.    Final    Assessment and Plan:   1) Retroperitoneal adenopathy and hepatic lesions  -12/13/2020 CT chest/abdomen/pelvis without contrast showed "1. Retroperitoneal adenopathy and perinephric nodularity with dilation of the RIGHT renal pelvis and collecting systems, filled with what is suspected to represent neoplasm. 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 Differential considerations given heart transplantation and presumed immunosuppression would include posttransplant lymphoproliferative disorder/lymphoma. 2. Dense material in collecting systems may represent a mixture of tumor and or blood and there is profound dilation of collecting systems of the RIGHT kidney. This is unchanged compared to the recent comparison study. 3. Signs of hepatic  involvement with new lesions in the RIGHT hepatic lobe. 4. Ascites with small amount of ascites with very subtle infiltration of fat in the omentum not mentioned above, for instance on image 50 of series 2 raising the question of peritoneal involvement as well. This area is in the range of 2-3 mm. 5. Small RIGHT pleural effusion and basilar airspace disease. 6. Postoperative changes about the chest  likely related to prior heart transplantation with surgical clips in the RIGHT axilla and thickening and calcification along the anterior pectoralis musculature not changed since 2018. 7. Calcification in the LEFT atrium also about the aortic root and pulmonary artery and to a lesser degree in the RIGHT atrium likely reflects changes of vascular anastomotic sites in the setting of heart transplantation. 8. 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 9. Post splenectomy. 10. Aortic atherosclerosis. 11. Small nodule in the LEFT upper lobe 6 mm airways are patent."  2) Back pain secondary to #1  3) Hyponatremia  4) Leukocytosis and Thrombocytosis  5) CKD  6) High-grade T1 bladder cancer  7) History of Hodgkin's lymphoma  8) status post heart transplant  PLAN: -CT scans have been reviewed.  And findings are concerning for malignancy.  Differentials include urothelial carcinoma versus post transplant lymphoproliferative disorder versus other.  Retroperitoneal adenopathy could also related to BCG. -The patient is scheduled for an image guided biopsy of his retroperitoneal lymph node later today.  Will await biopsy results.  Treatment options to be discussed once biopsy results have been obtained. -I recommend for the patient to try as needed morphine which has been ordered for him.  He is having difficulty with increased gastric acid with different narcotics.  Finding the right pain medication may require a trial of different  medications to see what he tolerates best. -Monitor sodium level closely.  Slowly improving with normal saline.  Thank you for this referral.     Mikey Bussing, DNP, AGPCNP-BC, AOCNP  ADDENDUM  .Patient was Personally and independently interviewed, examined and relevant elements of the history of present illness were reviewed in details and an assessment and plan was created. All elements of the patient's history of present illness , assessment and plan were discussed in details with Mikey Bussing, DNP, AGPCNP-BC, AOCNP . The above documentation reflects our combined findings assessment and plan.  Sullivan Lone MD MS

## 2020-12-06 NOTE — Progress Notes (Signed)
Biopsy dressing clean/dry/intact. Vitals WNL, patient alert, no distress.

## 2020-12-06 NOTE — Consult Note (Signed)
Chief Complaint: Patient was seen in consultation today for CT guided right retroperitoneal lymph node biopsy  Referring Physician(s): Kyle,T/Kale,G  Supervising Physician: Ruthann Cancer  Patient Status: [facility] - In-pt  History of Present Illness: Glenn Gonzalez is a 60 y.o. male with past medical history of atrial fibrillation, chronic kidney disease, recent bladder cancer, colon cancer, Hodgkin's lymphoma, GERD, gout, depression, nephrolithiasis, hypothyroidism, prior heart transplant, recent pneumonia and skin cancer.  He was recently admitted to [facility] with right  hydronephrosis and back pain.  He underwent ureteral stent placement 11/29/20 and was noted to have new retroperitoneal lymphadenopathy.  He was subsequently discharged home on 12/04/19 and was told to follow-up with oncology for repeat scans in a few weeks with possible intervention.  He then re presented to the urology office yesterday with increasing back pain on right side unrelieved with tramadol/ hydrocodone.  He was subsequently readmitted to the hospital.  CT of chest abdomen pelvis performed yesterday revealed;  1. Retroperitoneal adenopathy and perinephric nodularity with dilation of the RIGHT renal pelvis and collecting systems, filled with what is suspected to represent neoplasm. 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 Differential considerations given heart transplantation and presumed immunosuppression would include posttransplant lymphoproliferative disorder/lymphoma. 2. Dense material in collecting systems may represent a mixture of tumor and or blood and there is profound dilation of collecting systems of the RIGHT kidney. This is unchanged compared to the recent comparison study. 3. Signs of hepatic involvement with new lesions in the RIGHT hepatic lobe. 4. Ascites with small amount of  ascites with very subtle infiltration of fat in the omentum not mentioned above, for instance on image 50 of series 2 raising the question of peritoneal involvement as well. This area is in the range of 2-3 mm. 5. Small RIGHT pleural effusion and basilar airspace disease. 6. Postoperative changes about the chest likely related to prior heart transplantation with surgical clips in the RIGHT axilla and thickening and calcification along the anterior pectoralis musculature not changed since 2018. 7. Calcification in the LEFT atrium also about the aortic root and pulmonary artery and to a lesser degree in the RIGHT atrium likely reflects changes of vascular anastomotic sites in the setting of heart transplantation. 8. 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 9. Post splenectomy. 10. Aortic atherosclerosis. 11. Small nodule in the LEFT upper lobe 6 mm airways are patent.  Current labs include WBC 18.1, hemoglobin 13.5, platelets 459k, creatinine 1.87, PT/INR normal, COVID-19 negative.  Request now received for image guided right retroperitoneal lymph node biopsy for further evaluation.  Past Medical History:  Diagnosis Date  . A-fib ([place])    IN PAST with old heart   . Bladder tumor   . Bulging lumbar disc    states " i threw my back out on tuesday 6-16, i ahve a bulging disc, but i can lie on my back a side "   . Chronic fatigue   . Chronic kidney disease    CREATININE NOW 1.5 stage 1 ckd  . Colon cancer ([place]) DX 2019   SURGERY DONE  . Complication of anesthesia    AFTER HEART TRANSPLANT TOOK 2 YEARS TO GET BACK TO SELF; patient does not want aneshesia for endoscopy procedures   . Complication of anesthesia    likes propofol wants as few of anesthesia drugs as possible  .  Depression    SITUATIONAL  . GERD (gastroesophageal reflux disease)   . Gout yrs ago  . History of chemotherapy 1982 for hodgkins    DAMAGED HEART MUSCLE WITH RADIATION ALSO  . History of kidney stones   . History of radiation therapy 1982   for hodgkins lymphoma  . Hodgkin's lymphoma ([place]) 1980   IIIB. 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  . Hypothyroidism   . Pneumonia FROM CHF 2016   recurrent pneumonia sedf.  rad tx for lymphoma  . Skin abnormality    right chest small area with squamous cell area removed 3 weeks ago as of 07-27-2020  . Skin cancer    AREAS REMOVED FROM [facility], CHECK AND BACK AND HEAD SEPT 2019  . Status post heart transplantation ([place]) 2017   caused by chemo drug adriomycin    Past Surgical History:  Procedure Laterality Date  . BIOPSY  07/02/2018   Procedure: BIOPSY;  Surgeon: Carol Ada, MD;  Location: [facility];  Service: Endoscopy;;  . BIOPSY  05/20/2019   Procedure: BIOPSY;  Surgeon: Carol Ada, MD;  [facility]: [facility];  Service: Endoscopy;;  . CARDIAC CATHETERIZATION N/A 04/20/2015   Procedure: Right/Left Heart Cath and Coronary Angiography;  Surgeon: Jolaine Artist, MD;  Location: [place] [facility];  Service: Cardiovascular;  Laterality: N/A;  . CARDIAC CATHETERIZATION  10-11-15   pt states routine heart [facility] to [facility] at [facility]   . CARDIAC CATHETERIZATION N/A 04/02/2016   Procedure: Right Heart Cath;  Surgeon: Jolaine Artist, MD;  Location: [place] [facility];  Service: Cardiovascular;  Laterality: N/A;  . CARDIAC CATHETERIZATION  JULY 2019 AT [facility]  . COLONOSCOPY N/A 07/02/2018   Procedure: COLONOSCOPY;  Surgeon: Carol Ada, MD;  [facility];  Service: Endoscopy;  Laterality: N/A;  . COLONOSCOPY N/A 08/05/2019   Procedure: COLONOSCOPY;  Surgeon: Carol Ada, MD;  [facility];  Service: Endoscopy;  Laterality: N/A;  . COLONOSCOPY WITH PROPOFOL N/A 08/03/2015   Procedure: COLONOSCOPY WITH PROPOFOL;  Surgeon: Carol Ada, MD;  [facility];  Service: Endoscopy;  Laterality: N/A;  wants to try to do procedure without sedation  .  COLONOSCOPY WITH PROPOFOL N/A 08/10/2020   Procedure: COLONOSCOPY WITH PROPOFOL;  Surgeon: Carol Ada, MD;  [facility];  Service: Endoscopy;  Laterality: N/A;  . CYSTOSCOPY N/A 08/28/2020   Procedure: CYSTOSCOPY;  Surgeon: Remi Haggard, MD;  [facility];  Service: Urology;  Laterality: N/A;  . CYSTOSCOPY W/ RETROGRADES Bilateral 07/31/2020   Procedure: CYSTOSCOPY WITH RETROGRADE PYELOGRAM;  Surgeon: Remi Haggard, MD;  Location: [facility];  Service: Urology;  Laterality: Bilateral;  . CYSTOSCOPY W/ URETERAL STENT PLACEMENT Right 11/29/2020   Procedure: CYSTOSCOPY WITH RETROGRADE PYELOGRAM/URETERAL STENT PLACEMENT;  Surgeon: Festus Aloe, MD;  [facility];  Service: Urology;  Laterality: Right;  . CYSTOSCOPY WITH INSERTION OF UROLIFT N/A 10/18/2018   Procedure: CYSTOSCOPY WITH INSERTION OF UROLIFT;  Surgeon: Cleon Gustin, MD;  Location: [facility];  Service: Urology;  Laterality: N/A;  30 MINS  . CYSTOSCOPY WITH RETROGRADE PYELOGRAM, URETEROSCOPY AND STENT PLACEMENT Right 10/12/2015   Procedure: CYSTOSCOPY WITH RETROGRADE PYELOGRAM, URETEROSCOPY AND STENT PLACEMENT;  Surgeon: Cleon Gustin, MD;  [facility];  Service: Urology;  Laterality: Right;  . ESOPHAGOGASTRODUODENOSCOPY    . ESOPHAGOGASTRODUODENOSCOPY (EGD) WITH PROPOFOL N/A 09/29/2019   Procedure: ESOPHAGOGASTRODUODENOSCOPY (EGD) WITH PROPOFOL;  Surgeon: Carol Ada, MD;  [facility];  Service: Endoscopy;  Laterality: N/A;  . ESOPHAGOGASTRODUODENOSCOPY (EGD) WITH PROPOFOL N/A 08/10/2020   Procedure: ESOPHAGOGASTRODUODENOSCOPY (EGD) WITH PROPOFOL;  Surgeon: Carol Ada, MD;  [facility];  Service: Endoscopy;  Laterality: N/A;  . EXPLORATORY LAPAROTOMY  1982   staging laparotomy  . FLEXIBLE SIGMOIDOSCOPY N/A 05/20/2019   Procedure: FLEXIBLE SIGMOIDOSCOPY;  Surgeon: Carol Ada, MD;  [facility];  Service: Endoscopy;   Laterality: N/A;  . FLEXIBLE SIGMOIDOSCOPY N/A 04/20/2020   Procedure: FLEXIBLE SIGMOIDOSCOPY;  Surgeon: Carol Ada, MD;  [facility];  Service: Endoscopy;  Laterality: N/A;  . gynemastia Bilateral   . HEART TRANSPLANT  06/20/2016   at [facility]  . HOT HEMOSTASIS N/A 07/02/2018   Procedure: HOT HEMOSTASIS (ARGON PLASMA COAGULATION/BICAP);  Surgeon: Carol Ada, MD;  Location: [facility];  Service: Endoscopy;  Laterality: N/A;  . LUMBAR LAMINECTOMY/DECOMPRESSION MICRODISCECTOMY Right 01/04/2016   Procedure: Microdiscectomy Lumbar four Lumbar five right;  Surgeon: Eustace Moore, MD;  Location: [place] [facility];  Service: Neurosurgery;  Laterality: Right;  . neck biopses     x5  . 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 POLYPECTOMY  07/02/2018   Procedure: POLYPECTOMY;  Surgeon: Carol Ada, MD;  [facility];  Service: Endoscopy;;  . POLYPECTOMY  08/05/2019   Procedure: POLYPECTOMY;  Surgeon: Carol Ada, MD;  [facility];  Service: Endoscopy;;  . POLYPECTOMY  04/20/2020   Procedure: POLYPECTOMY;  Surgeon: Carol Ada, MD;  [facility];  Service: Endoscopy;;  . POLYPECTOMY  08/10/2020   Procedure: POLYPECTOMY;  Surgeon: Carol Ada, MD;  [facility];  Service: Endoscopy;;  . PORTACATH PLACEMENT     06-29-15 inserted, now removed.  . SPLENECTOMY, TOTAL  1981  . STONE EXTRACTION WITH BASKET Right 10/12/2015   Procedure: STONE EXTRACTION WITH BASKET;  Surgeon: Cleon Gustin, MD;  [facility];  Service: Urology;  Laterality: Right;  . SUBMUCOSAL INJECTION  07/02/2018   Procedure: SUBMUCOSAL INJECTION;  Surgeon: Carol Ada, MD;  [facility];  Service: Endoscopy;;  . TONSILLECTOMY  as child  . TRANSURETHRAL RESECTION OF BLADDER TUMOR N/A 07/31/2020   Procedure: TRANSURETHRAL RESECTION OF BLADDER TUMOR (TURBT) FULGERATION;  Surgeon: Remi Haggard, MD;  Location: [facility];  Service: Urology;  Laterality: N/A;  . TRANSURETHRAL RESECTION OF BLADDER TUMOR N/A 08/28/2020   Procedure: TRANSURETHRAL RESECTION OF BLADDER TUMOR (TURBT);  Surgeon: Remi Haggard, MD;  [facility];  Service: Urology;  Laterality: N/A;  30 MINS  . VARICELE REPAIR  40 YRS AGO    Allergies: Digoxin, Phencyclidine, Spironolactone, Oxybutynin chloride, Lactose intolerance (gi), Penicillins, and Sulfur  Medications: Prior to Admission medications   Medication Sig Start Date End Date Taking? Authorizing Provider  acetaminophen (TYLENOL) 650 MG CR tablet Take 1,300 mg by mouth daily as needed for pain.   Yes [provider]  allopurinol (ZYLOPRIM) 100 MG tablet Take 200 mg by mouth daily.  01/04/18  Yes [provider]  Azelastine-Fluticasone 137-50 MCG/ACT SUSP Place 1 spray into both nostrils daily. 11/27/20  Yes [provider]  calcium carbonate (TUMS) 500 MG chewable tablet Chew 2,000 mg by mouth daily as needed for indigestion or heartburn.   Yes [provider]  cefdinir (OMNICEF) 300 MG capsule Take 1 capsule (300 mg total) by mouth 2 (two) times daily for 7 days. Patient taking differently: Take 300 mg by mouth 2 (two) times daily. Start date: 12/03/20 12/03/20  12/10/20 Yes Dahal, Marlowe Aschoff, MD  cetirizine (ZYRTEC) 10 MG tablet Take 10 mg by mouth at bedtime.    Yes [provider]  cyclobenzaprine (FLEXERIL) 10 MG tablet Take 10 mg by mouth 3 (three) times daily as needed for muscle spasms.   Yes [provider]  ENVARSUS XR 0.75 MG TB24 Take 1.5 mg by mouth daily. 08/07/20  Yes [provider]  levothyroxine (SYNTHROID, LEVOTHROID) 125 MCG tablet Take 125 mcg by mouth daily.  01/09/16  Yes [provider]  NON FORMULARY Apply 1 application topically daily. [facility] Anti-fungal (Nail)  (3) Refills *added the urea 40% to the anti-fungal (nail) -#1   Yes [provider]  predniSONE  (DELTASONE) 2.5 MG tablet Take 1 tablet (2.5 mg total) by mouth daily. Patient taking differently: Take 5 mg by mouth daily. 02/25/18  Yes Regalado, Belkys A, MD  RABEprazole (ACIPHEX) 20 MG tablet Take 2 tablets (40 mg total) by mouth 2 (two) times daily before a meal. 12/03/20 03/03/21 Yes Dahal, Marlowe Aschoff, MD  traMADol (ULTRAM) 50 MG tablet Take 1 tablet (50 mg total) by mouth every 6 (six) hours as needed for up to 7 days. Patient taking differently: Take 50 mg by mouth every 6 (six) hours as needed for severe pain. 12/03/20 12/10/20 Yes Dahal, Marlowe Aschoff, MD  saccharomyces boulardii (FLORASTOR) 250 MG capsule Take 1 capsule (250 mg total) by mouth 2 (two) times daily for 7 days. 12/03/20 12/10/20  Terrilee Croak, MD  trimethoprim (TRIMPEX) 100 MG tablet TAKE 1 TABLET BY MOUTH EVERY DAY Patient not taking: No sig reported 10/11/20   Cleon Gustin, MD  trimethoprim (TRIMPEX) 100 MG tablet TAKE 1 TABLET BY MOUTH EVERY DAY Patient not taking: No sig reported 10/11/20   Cleon Gustin, MD     Family History  Problem Relation Age of Onset  . Hyperthyroidism Mother   . Insomnia Mother   . Hypertension Father   . Depression Father   . Insomnia Father     Social History   Socioeconomic History  . Marital status: Married    Spouse name: Not on file  . Number of children: Not on file  . Years of education: Not on file  . Highest education level: Not on file  Occupational History  . Occupation: disabled  Tobacco Use  . Smoking status: Never Smoker  . Smokeless tobacco: Never Used  Vaping Use  . Vaping Use: Never used  Substance and Sexual Activity  . Alcohol use: No  . Drug use: No  . Sexual activity: Not on file  Other Topics Concern  . Not on file  Social History Narrative  . Not on file   Social Determinants of Health   Financial Resource Strain: Not on file  Food Insecurity: Not on file  Transportation Needs: Not on file  Physical Activity: Not on file  Stress: Not on file   Social Connections: Not on file      Review of Systems currently denies fever, headache, chest pain, dyspnea, cough, abdominal pain, vomiting or bleeding.  He does have back pain and occasional nausea.  Vital Signs: BP 134/82 (BP Location: Right Arm)   Pulse 97   Temp 97.9 F (36.6 C) (Oral)   Resp 18   Ht 6\' 2"  (1.88 m)   Wt 225 lb 1.4 oz (102.1 kg)   SpO2 93%   BMI 28.90 kg/m   Physical Exam awake, alert.  Chest with slightly diminished breath sounds right base, left  clear.  Heart with regular rate and rhythm.  Abdomen soft, positive bowel sounds, nontender.  No lower extremity edema.  Imaging: CT ABDOMEN PELVIS WO CONTRAST  Result Date: 12/26/2020 CLINICAL DATA:  Cancer of unknown primary in this 60 year old male. EXAM: CT CHEST, ABDOMEN AND PELVIS WITHOUT CONTRAST TECHNIQUE: Multidetector CT imaging of the chest, abdomen and pelvis was performed following the standard protocol without IV contrast. COMPARISON:  CT abdomen and pelvis of the same date. Also with CT of December 30th of 2021 FINDINGS: CT CHEST FINDINGS Cardiovascular: Calcified atheromatous plaque in the thoracic aorta. Heart size is normal following sternotomy for heart transplant by report a [facility] medical center. Three-vessel coronary artery calcification. No pericardial effusion. Calcifications in the LEFT atrium partially seen on previous imaging are of uncertain significance central pulmonary vasculature also with some signs of calcification. Mediastinum/Nodes: Ovoid structure associated with LEFT thyroid 11 mm similar to previous imaging, cervical spine from 2017. Thyroid as described above. Calcifications in the LEFT supraclavicular region may be within venous structures on the LEFT. No axillary lymphadenopathy. Surgical clips in the RIGHT axilla and soft tissue thickening over the LEFT pectoralis musculature similar grossly compared to remote MRI evaluation 2018 and surgical clips seen on numerous priors in  the RIGHT axilla. No mediastinal lymphadenopathy. No gross hilar lymphadenopathy. Lungs/Pleura: Small RIGHT of pleural effusion and basilar airspace disease. Small nodule in the superior aspect of the RIGHT middle lobe approximately 3 mm on image 57 of series 5. Biapical scarring. Small nodule in the LEFT upper lobe 6 mm (image 51, series 5) airways are patent. Musculoskeletal: Evidence ir sternotomy without significant bony bridging but with sclerotic margins along the sternotomy line similar to studies dating back to August of 2021 with respect to visualized portions on prior abdomen studies no destructive bone finding or acute bone process. CT ABDOMEN PELVIS FINDINGS Hepatobiliary: 1.9 cm lesion in the RIGHT hepatic lobe near the dome of the RIGHT hemi liver does not display attenuation values of simple cyst is unchanged from the study acquired on the same date at another facility. Additionally a 1.4 cm lesion in the RIGHT hepatic lobe on image 23 of series 2 is unchanged. Sludge in the gallbladder. Tiny low-density foci in the LEFT hepatic lobe without change 1 on image 19 of series 2 in the lateral segment measuring 5 mm and another in the medial segment of the LEFT hepatic lobe measuring 12 mm on image 21 of series 2 these areas appears similar dating back to August of 2020, lesions in the RIGHT hepatic lobe are new. Pancreas: Normal pancreatic contour without signs of inflammation. Spleen: Post splenectomy. Adrenals/Urinary Tract: Adrenal glands, normal on the LEFT and obscured on the RIGHT by perinephric and Peri adrenal nodularity. Fullness of the RIGHT renal pelvis with nephroureteral stent in place showing soft tissue density with distension of the renal pelvis and calices similar to the study acquired on the same date. Nephrolithiasis also unchanged. Perinephric nodularity along the medial upper pole the LEFT kidney measures 6.4 x 2.7 cm which is within 1-2 mm of its previous measurement when measured by  this observer on the prior study. Increased in size when compared to the study of November 30, 2020. Renal cortical scarring on the LEFT with nephrolithiasis in the lower pole unchanged from very recent comparison. Urinary bladder with small amount of gas in the urinary bladder with distal aspect of the stent in place, loop coiled in the urinary bladder. Stomach/Bowel: Scattered colonic diverticulosis without acute  gastrointestinal process. Normal appendix. Stool and contrast throughout the colon. Vascular/Lymphatic: Calcified atheromatous plaque in the abdominal aorta without aneurysmal dilation. Retroperitoneal adenopathy unchanged from scan earlier the same day, largest lymph nodes behind the inferior vena cava and in the intra-aortocaval groove and in the upper abdomen, largest on image 41 of series 2 measuring 1.9 cm. Smaller lymph nodes along the LEFT periaortic chain. Postoperative changes in the retroperitoneum associated with previous lymphadenectomy No pelvic lymphadenopathy. Reproductive: Prostate with uro lift device deployment bilaterally similar to recent priors. Other: Extensive retroperitoneal fascial thickening/stranding extending into the root of the small bowel mesentery, nodular changes associated with this thickening on the RIGHT outlining the renal fascia this crosses the midline where it is without significant nodularity but with fascial thickening and stranding, also tracking into the pelvis. Small amount of fluid in the pelvis. Musculoskeletal: Spinal degenerative changes. No destructive bone process or acute bone finding. IMPRESSION: 1. Retroperitoneal adenopathy and perinephric nodularity with dilation of the RIGHT renal pelvis and collecting systems, filled with what is suspected to represent neoplasm. 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 Differential considerations given heart  transplantation and presumed immunosuppression would include posttransplant lymphoproliferative disorder/lymphoma. 2. Dense material in collecting systems may represent a mixture of tumor and or blood and there is profound dilation of collecting systems of the RIGHT kidney. This is unchanged compared to the recent comparison study. 3. Signs of hepatic involvement with new lesions in the RIGHT hepatic lobe. 4. Ascites with small amount of ascites with very subtle infiltration of fat in the omentum not mentioned above, for instance on image 50 of series 2 raising the question of peritoneal involvement as well. This area is in the range of 2-3 mm. 5. Small RIGHT pleural effusion and basilar airspace disease. 6. Postoperative changes about the chest likely related to prior heart transplantation with surgical clips in the RIGHT axilla and thickening and calcification along the anterior pectoralis musculature not changed since 2018. 7. Calcification in the LEFT atrium also about the aortic root and pulmonary artery and to a lesser degree in the RIGHT atrium likely reflects changes of vascular anastomotic sites in the setting of heart transplantation. 8. 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 9. Post splenectomy. 10. Aortic atherosclerosis. 11. Small nodule in the LEFT upper lobe 6 mm airways are patent. These results will be called to the ordering clinician or representative by the Radiologist Assistant, and communication documented in the PACS or [employer]. Aortic Atherosclerosis (ICD10-I70.0). Electronically Signed   By: Zetta Bills M.D.   On: 12/03/2020 17:38   DG Chest 1 View  Result Date: 11/29/2020 CLINICAL DATA:  Hypoxia EXAM: CHEST  1 VIEW COMPARISON:  08/03/2020 FINDINGS: Since the prior examination, there has developed right basilar consolidation, possibly infectious in the appropriate clinical setting. Mild focal  infiltrate is also noted within the right apex. Left lung is clear. No pneumothorax or pleural effusion. Median sternotomy has been performed. Cardiac size within normal limits. Multiple healed right rib fractures are noted. Multiple surgical clips are seen within the right axilla. IMPRESSION: Interval development of multifocal pulmonary infiltrate within the right lung, more focal within the right lung base, suspicious for atypical infection in the acute setting. Electronically Signed   By: Fidela Salisbury MD   On: 11/29/2020 22:27   DG Chest 2 View  Result Date: 12/31/2020 CLINICAL  DATA:  Bladder cancer.  Follow-up multifocal pneumonia. EXAM: CHEST - 2 VIEW COMPARISON:  11/29/2020 FINDINGS: Prior median sternotomy. Heart is normal size. Patchy right lung airspace disease in the right apex and right lower lung. Minimal left base linear atelectasis or scarring. Overall aeration in the right lung slightly improved. No effusions. IMPRESSION: Patchy right lung airspace disease with slight improvement since prior study. Left base atelectasis or scarring. Electronically Signed   By: Rolm Baptise M.D.   On: 12/17/2020 13:25   CT CHEST WO CONTRAST  Result Date: 12/14/2020 CLINICAL DATA:  Cancer of unknown primary in this 60 year old male. EXAM: CT CHEST, ABDOMEN AND PELVIS WITHOUT CONTRAST TECHNIQUE: Multidetector CT imaging of the chest, abdomen and pelvis was performed following the standard protocol without IV contrast. COMPARISON:  CT abdomen and pelvis of the same date. Also with CT of December 30th of 2021 FINDINGS: CT CHEST FINDINGS Cardiovascular: Calcified atheromatous plaque in the thoracic aorta. Heart size is normal following sternotomy for heart transplant by report a [facility] medical center. Three-vessel coronary artery calcification. No pericardial effusion. Calcifications in the LEFT atrium partially seen on previous imaging are of uncertain significance central pulmonary vasculature also with  some signs of calcification. Mediastinum/Nodes: Ovoid structure associated with LEFT thyroid 11 mm similar to previous imaging, cervical spine from 2017. Thyroid as described above. Calcifications in the LEFT supraclavicular region may be within venous structures on the LEFT. No axillary lymphadenopathy. Surgical clips in the RIGHT axilla and soft tissue thickening over the LEFT pectoralis musculature similar grossly compared to remote MRI evaluation 2018 and surgical clips seen on numerous priors in the RIGHT axilla. No mediastinal lymphadenopathy. No gross hilar lymphadenopathy. Lungs/Pleura: Small RIGHT of pleural effusion and basilar airspace disease. Small nodule in the superior aspect of the RIGHT middle lobe approximately 3 mm on image 57 of series 5. Biapical scarring. Small nodule in the LEFT upper lobe 6 mm (image 51, series 5) airways are patent. Musculoskeletal: Evidence ir sternotomy without significant bony bridging but with sclerotic margins along the sternotomy line similar to studies dating back to August of 2021 with respect to visualized portions on prior abdomen studies no destructive bone finding or acute bone process. CT ABDOMEN PELVIS FINDINGS Hepatobiliary: 1.9 cm lesion in the RIGHT hepatic lobe near the dome of the RIGHT hemi liver does not display attenuation values of simple cyst is unchanged from the study acquired on the same date at another facility. Additionally a 1.4 cm lesion in the RIGHT hepatic lobe on image 23 of series 2 is unchanged. Sludge in the gallbladder. Tiny low-density foci in the LEFT hepatic lobe without change 1 on image 19 of series 2 in the lateral segment measuring 5 mm and another in the medial segment of the LEFT hepatic lobe measuring 12 mm on image 21 of series 2 these areas appears similar dating back to August of 2020, lesions in the RIGHT hepatic lobe are new. Pancreas: Normal pancreatic contour without signs of inflammation. Spleen: Post splenectomy.  Adrenals/Urinary Tract: Adrenal glands, normal on the LEFT and obscured on the RIGHT by perinephric and Peri adrenal nodularity. Fullness of the RIGHT renal pelvis with nephroureteral stent in place showing soft tissue density with distension of the renal pelvis and calices similar to the study acquired on the same date. Nephrolithiasis also unchanged. Perinephric nodularity along the medial upper pole the LEFT kidney measures 6.4 x 2.7 cm which is within 1-2 mm of its previous measurement when measured by this observer  on the prior study. Increased in size when compared to the study of November 30, 2020. Renal cortical scarring on the LEFT with nephrolithiasis in the lower pole unchanged from very recent comparison. Urinary bladder with small amount of gas in the urinary bladder with distal aspect of the stent in place, loop coiled in the urinary bladder. Stomach/Bowel: Scattered colonic diverticulosis without acute gastrointestinal process. Normal appendix. Stool and contrast throughout the colon. Vascular/Lymphatic: Calcified atheromatous plaque in the abdominal aorta without aneurysmal dilation. Retroperitoneal adenopathy unchanged from scan earlier the same day, largest lymph nodes behind the inferior vena cava and in the intra-aortocaval groove and in the upper abdomen, largest on image 41 of series 2 measuring 1.9 cm. Smaller lymph nodes along the LEFT periaortic chain. Postoperative changes in the retroperitoneum associated with previous lymphadenectomy No pelvic lymphadenopathy. Reproductive: Prostate with uro lift device deployment bilaterally similar to recent priors. Other: Extensive retroperitoneal fascial thickening/stranding extending into the root of the small bowel mesentery, nodular changes associated with this thickening on the RIGHT outlining the renal fascia this crosses the midline where it is without significant nodularity but with fascial thickening and stranding, also tracking into the  pelvis. Small amount of fluid in the pelvis. Musculoskeletal: Spinal degenerative changes. No destructive bone process or acute bone finding. IMPRESSION: 1. Retroperitoneal adenopathy and perinephric nodularity with dilation of the RIGHT renal pelvis and collecting systems, filled with what is suspected to represent neoplasm. 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 Differential considerations given heart transplantation and presumed immunosuppression would include posttransplant lymphoproliferative disorder/lymphoma. 2. Dense material in collecting systems may represent a mixture of tumor and or blood and there is profound dilation of collecting systems of the RIGHT kidney. This is unchanged compared to the recent comparison study. 3. Signs of hepatic involvement with new lesions in the RIGHT hepatic lobe. 4. Ascites with small amount of ascites with very subtle infiltration of fat in the omentum not mentioned above, for instance on image 50 of series 2 raising the question of peritoneal involvement as well. This area is in the range of 2-3 mm. 5. Small RIGHT pleural effusion and basilar airspace disease. 6. Postoperative changes about the chest likely related to prior heart transplantation with surgical clips in the RIGHT axilla and thickening and calcification along the anterior pectoralis musculature not changed since 2018. 7. Calcification in the LEFT atrium also about the aortic root and pulmonary artery and to a lesser degree in the RIGHT atrium likely reflects changes of vascular anastomotic sites in the setting of heart transplantation. 8. 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 9. Post splenectomy. 10. Aortic atherosclerosis. 11. Small nodule in the LEFT upper lobe 6 mm airways are patent. These results will be called to the  ordering clinician or representative by the Radiologist Assistant, and communication documented in the PACS or [employer]. Aortic Atherosclerosis (ICD10-I70.0). Electronically Signed   By: Zetta Bills M.D.   On: 12/12/2020 17:38   NM Pulmonary Perfusion  Result Date: 12/15/2020 CLINICAL DATA:  Respiratory failure EXAM: NUCLEAR MEDICINE PERFUSION LUNG SCAN TECHNIQUE: Perfusion images were obtained in multiple projections after intravenous injection of radiopharmaceutical. Ventilation scans intentionally deferred if perfusion scan and chest x-ray adequate for interpretation during COVID 19 epidemic. RADIOPHARMACEUTICALS:  4.1 mCi Tc-95m MAA IV COMPARISON:  Chest x-ray 12/10/2020 FINDINGS: Slightly heterogeneous distribution of radiotracer within the lung fields.  Multiple small bilateral wedge-shaped perfusion defects. No corresponding radiographic abnormality. No large mismatched segmental perfusion defect. IMPRESSION: Intermediate probability for pulmonary embolism. Electronically Signed   By: Davina Poke D.O.   On: 12/28/2020 13:30   DG C-Arm 1-60 Min-No Report  Result Date: 11/29/2020 Fluoroscopy was utilized by the requesting physician.  No radiographic interpretation.   ECHOCARDIOGRAM COMPLETE  Result Date: 11/30/2020    ECHOCARDIOGRAM REPORT   Patient Name:   Glenn Gonzalez Sparkman Date of Exam: 11/30/2020 Medical Rec #:  [id number]         Height:       74.0 in Accession #:    [id number]        Weight:       221.0 lb Date of Birth:  1961/05/09        BSA:          2.268 m Patient Age:    68 years          BP:           103/59 mmHg Patient Gender: M                 HR:           94 bpm. Exam Location:  Inpatient Procedure: 2D Echo, Cardiac Doppler, Color Doppler and Intracardiac            Opacification Agent Indications:    Dyspnea R06.00  History:        Patient has prior history of Echocardiogram examinations, most                 recent 05/12/2017. Arrythmias:Atrial Fibrillation; Risk                  Factors:Non-Smoker. GERD. 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  Sonographer:    Vickie Epley RDCS Referring Phys: [id number] [facility]  Sonographer Comments: Suboptimal apical window and suboptimal subcostal window. Pt unable to turn on side due to herniated discs. IMPRESSIONS  1. Left ventricular ejection fraction, by estimation, is 60 to 65%. The left ventricle has normal function. The left ventricle has no regional wall motion abnormalities. Left ventricular diastolic parameters were normal.  2. Right ventricular systolic function is normal. The right ventricular size is normal. There is normal pulmonary artery systolic pressure. The estimated right ventricular systolic pressure is 67.1 mmHg.  3. The mitral valve is normal in structure. No evidence of mitral valve regurgitation. No evidence of mitral stenosis.  4. The aortic valve is normal in structure. Aortic valve regurgitation is not visualized. No aortic stenosis is present.  5. The inferior vena cava is normal in size with greater than 50% respiratory variability, suggesting right atrial pressure of 3 mmHg. FINDINGS  Left Ventricle: Left ventricular ejection fraction, by estimation, is 60 to 65%. The left ventricle has normal function. The left ventricle has no regional wall motion abnormalities. Definity contrast agent was given IV to delineate the left ventricular  endocardial borders. The left ventricular internal cavity size was normal in size. There is no left ventricular hypertrophy. Left ventricular diastolic parameters were normal. Normal left ventricular filling pressure. Right Ventricle: The right ventricular size is normal. No increase in right ventricular wall thickness. Right ventricular systolic function is normal. There is normal pulmonary artery systolic pressure. The tricuspid regurgitant velocity is 2.39 m/s, and  with an assumed right atrial pressure of 3 mmHg, the estimated right ventricular systolic pressure is 24.5 mmHg. Left  Atrium: Left atrial size was normal in  size. Right Atrium: Right atrial size was normal in size. Pericardium: There is no evidence of pericardial effusion. Mitral Valve: The mitral valve is normal in structure. No evidence of mitral valve regurgitation. No evidence of mitral valve stenosis. Tricuspid Valve: The tricuspid valve is normal in structure. Tricuspid valve regurgitation is mild . No evidence of tricuspid stenosis. Aortic Valve: The aortic valve is normal in structure. Aortic valve regurgitation is not visualized. No aortic stenosis is present. Pulmonic Valve: The pulmonic valve was normal in structure. Pulmonic valve regurgitation is not visualized. No evidence of pulmonic stenosis. Aorta: The aortic root is normal in size and structure. Venous: The inferior vena cava is normal in size with greater than 50% respiratory variability, suggesting right atrial pressure of 3 mmHg. IAS/Shunts: No atrial level shunt detected by color flow Doppler.  LEFT VENTRICLE PLAX 2D LVIDd:         4.40 cm     Diastology LVIDs:         3.20 cm     LV e' medial:    10.00 cm/s LV PW:         0.90 cm     LV E/e' medial:  7.5 LV IVS:        0.90 cm     LV e' lateral:   11.30 cm/s LVOT diam:     2.50 cm     LV E/e' lateral: 6.7 LV SV:         48 LV SV Index:   21 LVOT Area:     4.91 cm  LV Volumes (MOD) LV vol d, MOD A4C: 80.2 ml LV vol s, MOD A4C: 30.4 ml LV SV MOD A4C:     80.2 ml LEFT ATRIUM           Index LA diam:      2.70 cm 1.19 cm/m LA Vol (A4C): 16.3 ml 7.19 ml/m  AORTIC VALVE LVOT Vmax:   50.20 cm/s LVOT Vmean:  36.700 cm/s LVOT VTI:    0.099 m  AORTA Ao Root diam: 3.40 cm MITRAL VALVE               TRICUSPID VALVE MV Area (PHT): 5.38 cm    TR Peak grad:   22.8 mmHg MV Decel Time: 141 msec    TR Vmax:        239.00 cm/s MV E velocity: 75.40 cm/s MV A velocity: 36.00 cm/s  SHUNTS MV E/A ratio:  2.09        Systemic VTI:  0.10 m                            Systemic Diam: 2.50 cm Dani Gobble Croitoru MD Electronically signed  by Sanda Klein MD Signature Date/Time: 11/30/2020/12:30:40 PM    Final    VAS [place] LOWER EXTREMITY VENOUS (DVT)  Result Date: 12/03/2020  Lower Venous DVT Study Indications: Pain.  Comparison Study: Previous 12/2019 Performing Technologist: Vonzell Schlatter RVT  Examination Guidelines: A complete evaluation includes B-mode imaging, spectral Doppler, color Doppler, and power Doppler as needed of all accessible portions of each vessel. Bilateral testing is considered an integral part of a complete examination. Limited examinations for reoccurring indications may be performed as noted. The reflux portion of the exam is performed with the patient in reverse Trendelenburg.  +---------+---------------+---------+-----------+----------+--------------+ RIGHT    CompressibilityPhasicitySpontaneityPropertiesThrombus Aging +---------+---------------+---------+-----------+----------+--------------+ CFV      Full  Yes      Yes                                 +---------+---------------+---------+-----------+----------+--------------+ SFJ      Full                                                        +---------+---------------+---------+-----------+----------+--------------+ FV Prox  Full                                                        +---------+---------------+---------+-----------+----------+--------------+ FV Mid   Full                                                        +---------+---------------+---------+-----------+----------+--------------+ FV DistalFull                                                        +---------+---------------+---------+-----------+----------+--------------+ PFV      Full                                                        +---------+---------------+---------+-----------+----------+--------------+ POP      Full           Yes      Yes                                  +---------+---------------+---------+-----------+----------+--------------+ PTV      Full                                                        +---------+---------------+---------+-----------+----------+--------------+ PERO     Full                                                        +---------+---------------+---------+-----------+----------+--------------+   +---------+---------------+---------+-----------+----------+--------------+ LEFT     CompressibilityPhasicitySpontaneityPropertiesThrombus Aging +---------+---------------+---------+-----------+----------+--------------+ CFV      Full           Yes      Yes                                 +---------+---------------+---------+-----------+----------+--------------+ SFJ  Full                                                        +---------+---------------+---------+-----------+----------+--------------+ FV Prox  Full                                                        +---------+---------------+---------+-----------+----------+--------------+ FV Mid   Full                                                        +---------+---------------+---------+-----------+----------+--------------+ FV DistalFull                                                        +---------+---------------+---------+-----------+----------+--------------+ PFV      Full                                                        +---------+---------------+---------+-----------+----------+--------------+ POP      Full           Yes      Yes                                 +---------+---------------+---------+-----------+----------+--------------+ PTV      Full                                                        +---------+---------------+---------+-----------+----------+--------------+ PERO     Full                                                         +---------+---------------+---------+-----------+----------+--------------+     Summary: RIGHT: - There is no evidence of deep vein thrombosis in the lower extremity.  - No cystic structure found in the popliteal fossa.  LEFT: - There is no evidence of deep vein thrombosis in the lower extremity.  - No cystic structure found in the popliteal fossa.  *See table(s) above for measurements and observations. Electronically signed by Ruta Hinds MD on 12/03/2020 at 12:15:41 PM.    Final     Labs:  CBC: Recent Labs    12/02/20 0522 12/03/20 0520 12/27/2020 1923 12/06/20 0519  WBC 19.8* 19.7* 19.7* 18.1*  HGB 12.9* 14.4 14.1 13.5  HCT 40.2 42.2 42.0 39.5  PLT 409*  460* 418* 459*    COAGS: Recent Labs    11/30/20 0031  INR 1.0  APTT 22*    BMP: Recent Labs    07/27/20 0653 08/20/20 0934 08/28/20 0700 08/31/20 1126 09/06/20 0712 12/02/20 0522 12/03/20 0520 12/08/2020 1923 12/06/20 0519  NA 137 137 140 139   < > 132* 130* 119* 121*  K 4.1 4.1 3.6 3.8   < > 3.8 3.7 4.2 3.7  CL 100 104 103 105   < > 97* 93* 82* 85*  CO2 27 27 25 26    < > 23 21* 25 22  GLUCOSE 93 91 97 93   < > 102* 105* 106* 81  BUN 19 20 20 15    < > 24* 22* 32* 30*  CALCIUM 8.9 9.4 9.3 9.1   < > 8.6* 9.3 9.2 8.6*  CREATININE 1.52* 1.52* 1.45* 1.36*   < > 1.97* 1.87* 1.81* 1.87*  GFRNONAA 50* 50* 53* 57*   < > 38* 41* 43* 41*  GFRAA 58* 58* >60 >60  --   --   --   --   --    < > = values in this interval not displayed.    LIVER FUNCTION TESTS: Recent Labs    08/31/20 1126 12/08/2020 0639 12/05/20 1923 12/06/20 0519  BILITOT 0.7 0.6 0.7 0.9  AST 16 20 22 21   ALT 10 10 12 11   ALKPHOS 80 61 58 54  PROT 6.8 5.8* 6.4* 5.9*  ALBUMIN 3.4* 3.0* 3.2* 3.0*    TUMOR MARKERS: No results for input(s): AFPTM, CEA, CA199, CHROMGRNA in the last 8760 hours.  Assessment and Plan: 60 y.o. male with past medical history of atrial fibrillation, chronic kidney disease, recent bladder cancer, colon cancer, Hodgkin's  lymphoma, GERD, gout, depression, nephrolithiasis, hypothyroidism, prior heart transplant, recent pneumonia and skin cancer.  He was recently admitted to [facility] with right  hydronephrosis and back pain.  He underwent ureteral stent placement 11/29/20 and was noted to have new retroperitoneal lymphadenopathy.  He was subsequently discharged home on 12/04/19 and was told to follow-up with oncology for repeat scans in a few weeks with possible intervention.  He then re presented to the urology office yesterday with increasing back pain on right side unrelieved with tramadol/ hydrocodone.  He was subsequently readmitted to the hospital.  CT of chest abdomen pelvis performed yesterday revealed;  1. Retroperitoneal adenopathy and perinephric nodularity with dilation of the RIGHT renal pelvis and collecting systems, filled with what is suspected to represent neoplasm. 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 Differential considerations given heart transplantation and presumed immunosuppression would include posttransplant lymphoproliferative disorder/lymphoma. 2. Dense material in collecting systems may represent a mixture of tumor and or blood and there is profound dilation of collecting systems of the RIGHT kidney. This is unchanged compared to the recent comparison study. 3. Signs of hepatic involvement with new lesions in the RIGHT hepatic lobe. 4. Ascites with small amount of ascites with very subtle infiltration of fat in the omentum not mentioned above, for instance on image 50 of series 2 raising the question of peritoneal involvement as well. This area is in the range of 2-3 mm. 5. Small RIGHT pleural effusion and basilar airspace disease. 6. Postoperative changes about the chest likely related to prior heart transplantation with surgical clips in the RIGHT axilla and thickening  and calcification along the anterior pectoralis musculature not changed since  2018. 7. Calcification in the LEFT atrium also about the aortic root and pulmonary artery and to a lesser degree in the RIGHT atrium likely reflects changes of vascular anastomotic sites in the setting of heart transplantation. 8. 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 9. Post splenectomy. 10. Aortic atherosclerosis. 11. Small nodule in the LEFT upper lobe 6 mm airways are patent.  Current labs include WBC 18.1, hemoglobin 13.5, platelets 459k, creatinine 1.87, PT/INR normal, COVID-19 negative.  Request now received for image guided right retroperitoneal lymph node biopsy for further evaluation.  Imaging studies were reviewed by Dr. Serafina Royals.Risks and benefits of procedure was discussed with the patient including, but not limited to bleeding, infection, damage to adjacent structures or low yield requiring additional tests.  All of the questions were answered and there is agreement to proceed.  Consent signed and in chart.      Thank you for this interesting consult.  I greatly enjoyed meeting Somtochukwu Woollard Cavanagh and look forward to participating in their care.  A copy of this report was sent to the requesting provider on this date.  Electronically Signed: D. Rowe Robert, PA-C 12/06/2020, 10:05 AM   I spent a total of 25 minutes  in face to face in clinical consultation, greater than 50% of which was counseling/coordinating care for image guided biopsy of right retroperitoneal lymph node

## 2020-12-07 DIAGNOSIS — T40605A Adverse effect of unspecified narcotics, initial encounter: Secondary | ICD-10-CM | POA: Diagnosis present

## 2020-12-07 DIAGNOSIS — C786 Secondary malignant neoplasm of retroperitoneum and peritoneum: Secondary | ICD-10-CM | POA: Diagnosis present

## 2020-12-07 DIAGNOSIS — I5022 Chronic systolic (congestive) heart failure: Secondary | ICD-10-CM | POA: Diagnosis not present

## 2020-12-07 DIAGNOSIS — C679 Malignant neoplasm of bladder, unspecified: Secondary | ICD-10-CM | POA: Diagnosis present

## 2020-12-07 DIAGNOSIS — D72829 Elevated white blood cell count, unspecified: Secondary | ICD-10-CM | POA: Diagnosis not present

## 2020-12-07 DIAGNOSIS — E440 Moderate protein-calorie malnutrition: Secondary | ICD-10-CM | POA: Diagnosis not present

## 2020-12-07 DIAGNOSIS — N1330 Unspecified hydronephrosis: Secondary | ICD-10-CM | POA: Diagnosis present

## 2020-12-07 DIAGNOSIS — Z515 Encounter for palliative care: Secondary | ICD-10-CM | POA: Diagnosis not present

## 2020-12-07 DIAGNOSIS — K219 Gastro-esophageal reflux disease without esophagitis: Secondary | ICD-10-CM | POA: Diagnosis present

## 2020-12-07 DIAGNOSIS — I5043 Acute on chronic combined systolic (congestive) and diastolic (congestive) heart failure: Secondary | ICD-10-CM

## 2020-12-07 DIAGNOSIS — N189 Chronic kidney disease, unspecified: Secondary | ICD-10-CM

## 2020-12-07 DIAGNOSIS — E039 Hypothyroidism, unspecified: Secondary | ICD-10-CM | POA: Diagnosis present

## 2020-12-07 DIAGNOSIS — K769 Liver disease, unspecified: Secondary | ICD-10-CM | POA: Diagnosis not present

## 2020-12-07 DIAGNOSIS — I82441 Acute embolism and thrombosis of right tibial vein: Secondary | ICD-10-CM | POA: Diagnosis not present

## 2020-12-07 DIAGNOSIS — R0902 Hypoxemia: Secondary | ICD-10-CM

## 2020-12-07 DIAGNOSIS — C801 Malignant (primary) neoplasm, unspecified: Secondary | ICD-10-CM

## 2020-12-07 DIAGNOSIS — I361 Nonrheumatic tricuspid (valve) insufficiency: Secondary | ICD-10-CM | POA: Diagnosis not present

## 2020-12-07 DIAGNOSIS — K5903 Drug induced constipation: Secondary | ICD-10-CM | POA: Diagnosis present

## 2020-12-07 DIAGNOSIS — I313 Pericardial effusion (noninflammatory): Secondary | ICD-10-CM | POA: Diagnosis not present

## 2020-12-07 DIAGNOSIS — I82401 Acute embolism and thrombosis of unspecified deep veins of right lower extremity: Secondary | ICD-10-CM | POA: Diagnosis not present

## 2020-12-07 DIAGNOSIS — E89811 Postprocedural hemorrhage and hematoma of an endocrine system organ or structure following other procedure: Secondary | ICD-10-CM | POA: Diagnosis not present

## 2020-12-07 DIAGNOSIS — N186 End stage renal disease: Secondary | ICD-10-CM | POA: Diagnosis not present

## 2020-12-07 DIAGNOSIS — M545 Low back pain, unspecified: Secondary | ICD-10-CM | POA: Diagnosis not present

## 2020-12-07 DIAGNOSIS — M549 Dorsalgia, unspecified: Secondary | ICD-10-CM | POA: Diagnosis present

## 2020-12-07 DIAGNOSIS — Z5111 Encounter for antineoplastic chemotherapy: Secondary | ICD-10-CM | POA: Diagnosis not present

## 2020-12-07 DIAGNOSIS — Z20822 Contact with and (suspected) exposure to covid-19: Secondary | ICD-10-CM | POA: Diagnosis present

## 2020-12-07 DIAGNOSIS — Z7189 Other specified counseling: Secondary | ICD-10-CM | POA: Diagnosis not present

## 2020-12-07 DIAGNOSIS — C787 Secondary malignant neoplasm of liver and intrahepatic bile duct: Secondary | ICD-10-CM | POA: Diagnosis present

## 2020-12-07 DIAGNOSIS — C7910 Secondary malignant neoplasm of unspecified urinary organs: Secondary | ICD-10-CM | POA: Diagnosis not present

## 2020-12-07 DIAGNOSIS — E871 Hypo-osmolality and hyponatremia: Secondary | ICD-10-CM | POA: Diagnosis present

## 2020-12-07 DIAGNOSIS — J189 Pneumonia, unspecified organism: Secondary | ICD-10-CM | POA: Diagnosis present

## 2020-12-07 DIAGNOSIS — R188 Other ascites: Secondary | ICD-10-CM | POA: Diagnosis present

## 2020-12-07 DIAGNOSIS — N170 Acute kidney failure with tubular necrosis: Secondary | ICD-10-CM | POA: Diagnosis not present

## 2020-12-07 DIAGNOSIS — M109 Gout, unspecified: Secondary | ICD-10-CM | POA: Diagnosis present

## 2020-12-07 DIAGNOSIS — N179 Acute kidney failure, unspecified: Secondary | ICD-10-CM

## 2020-12-07 DIAGNOSIS — G893 Neoplasm related pain (acute) (chronic): Secondary | ICD-10-CM | POA: Diagnosis not present

## 2020-12-07 DIAGNOSIS — R590 Localized enlarged lymph nodes: Secondary | ICD-10-CM | POA: Diagnosis not present

## 2020-12-07 DIAGNOSIS — Z941 Heart transplant status: Secondary | ICD-10-CM | POA: Diagnosis not present

## 2020-12-07 DIAGNOSIS — N1831 Chronic kidney disease, stage 3a: Secondary | ICD-10-CM | POA: Diagnosis not present

## 2020-12-07 DIAGNOSIS — Y848 Other medical procedures as the cause of abnormal reaction of the patient, or of later complication, without mention of misadventure at the time of the procedure: Secondary | ICD-10-CM | POA: Diagnosis not present

## 2020-12-07 DIAGNOSIS — R52 Pain, unspecified: Secondary | ICD-10-CM | POA: Diagnosis not present

## 2020-12-07 DIAGNOSIS — Z66 Do not resuscitate: Secondary | ICD-10-CM | POA: Diagnosis not present

## 2020-12-07 DIAGNOSIS — R140 Abdominal distension (gaseous): Secondary | ICD-10-CM | POA: Diagnosis not present

## 2020-12-07 LAB — BASIC METABOLIC PANEL
Anion gap: 12 (ref 5–15)
Anion gap: 14 (ref 5–15)
BUN: 31 mg/dL — ABNORMAL HIGH (ref 6–20)
BUN: 34 mg/dL — ABNORMAL HIGH (ref 6–20)
CO2: 22 mmol/L (ref 22–32)
CO2: 21 mmol/L — ABNORMAL LOW (ref 22–32)
Calcium: 8.4 mg/dL — ABNORMAL LOW (ref 8.9–10.3)
Calcium: 8.3 mg/dL — ABNORMAL LOW (ref 8.9–10.3)
Chloride: 92 mmol/L — ABNORMAL LOW (ref 98–111)
Chloride: 91 mmol/L — ABNORMAL LOW (ref 98–111)
Creatinine, Ser: 2.06 mg/dL — ABNORMAL HIGH (ref 0.61–1.24)
Creatinine, Ser: 2.06 mg/dL — ABNORMAL HIGH (ref 0.61–1.24)
GFR, Estimated: 36 mL/min — ABNORMAL LOW (ref >60)
GFR, Estimated: 36 mL/min — ABNORMAL LOW (ref >60)
Glucose, Bld: 90 mg/dL (ref 70–99)
Glucose, Bld: 93 mg/dL (ref 70–99)
Potassium: 4 mmol/L (ref 3.5–5.1)
Potassium: 3.8 mmol/L (ref 3.5–5.1)
Sodium: 126 mmol/L — ABNORMAL LOW (ref 135–145)
Sodium: 126 mmol/L — ABNORMAL LOW (ref 135–145)

## 2020-12-07 LAB — CBC
HCT: 40.6 % (ref 39.0–52.0)
HCT: 40.8 % (ref 39.0–52.0)
Hemoglobin: 13.7 g/dL (ref 13.0–17.0)
Hemoglobin: 13.9 g/dL (ref 13.0–17.0)
MCH: 31.8 pg (ref 26.0–34.0)
MCH: 32.1 pg (ref 26.0–34.0)
MCHC: 33.7 g/dL (ref 30.0–36.0)
MCHC: 34.1 g/dL (ref 30.0–36.0)
MCV: 94.2 fL (ref 80.0–100.0)
MCV: 94.2 fL (ref 80.0–100.0)
Platelets: 438 10*3/uL — ABNORMAL HIGH (ref 150–400)
Platelets: 440 10*3/uL — ABNORMAL HIGH (ref 150–400)
RBC: 4.31 MIL/uL (ref 4.22–5.81)
RBC: 4.33 MIL/uL (ref 4.22–5.81)
RDW: 15 % (ref 11.5–15.5)
RDW: 15.1 % (ref 11.5–15.5)
WBC: 17.1 10*3/uL — ABNORMAL HIGH (ref 4.0–10.5)
WBC: 17.2 10*3/uL — ABNORMAL HIGH (ref 4.0–10.5)
nRBC: 0 % (ref 0.0–0.2)
nRBC: 0.1 % (ref 0.0–0.2)

## 2020-12-07 MED ORDER — MORPHINE SULFATE (PF) 2 MG/ML IV SOLN: 2.0000 mg | INTRAVENOUS | Status: DC | PRN

## 2020-12-07 MED ORDER — MORPHINE SULFATE (PF) 2 MG/ML IV SOLN
1.0000 mg | Freq: Once | INTRAVENOUS | Status: AC
  Administered 2020-12-07: 1 mg via INTRAVENOUS
  Filled 2020-12-07: qty 1

## 2020-12-07 MED ORDER — LACTULOSE 10 GM/15ML PO SOLN
10.0000 g | ORAL | Status: DC | PRN
  Administered 2020-12-07: 10 g via ORAL
  Filled 2020-12-07: qty 30

## 2020-12-07 MED ORDER — HYDROMORPHONE HCL 1 MG/ML IJ SOLN
0.5000 mg | INTRAMUSCULAR | Status: DC | PRN
  Administered 2020-12-07 – 2020-12-08 (×3): 0.5 mg via INTRAVENOUS
  Filled 2020-12-07 (×3): qty 0.5

## 2020-12-07 MED ORDER — PANTOPRAZOLE SODIUM 40 MG PO TBEC
40.0000 mg | DELAYED_RELEASE_TABLET | Freq: Two times a day (BID) | ORAL | Status: DC
  Administered 2020-12-08 – 2020-12-12 (×7): 40 mg via ORAL
  Filled 2020-12-07 (×14): qty 1

## 2020-12-07 MED ORDER — SENNOSIDES-DOCUSATE SODIUM 8.6-50 MG PO TABS
2.0000 | ORAL_TABLET | Freq: Two times a day (BID) | ORAL | Status: DC
  Administered 2020-12-07 – 2020-12-15 (×17): 2 via ORAL
  Filled 2020-12-07 (×17): qty 2

## 2020-12-07 MED ORDER — MORPHINE SULFATE (PF) 2 MG/ML IV SOLN
1.0000 mg | INTRAVENOUS | Status: DC | PRN
  Administered 2020-12-07: 1 mg via INTRAVENOUS
  Filled 2020-12-07: qty 1

## 2020-12-07 NOTE — Progress Notes (Addendum)
[facility]  PROGRESS NOTE  Glenn Gonzalez QXI:[id number] DOB: 1961/10/11 DOA: 12/28/2020 PCP: Lajean Manes, MD Admit date - 12/08/2020   Admitting Physician Desiree Hane, MD  Outpatient Primary MD for the patient is Lajean Manes, MD  LOS - 0 Brief Narrative   Glenn Gonzalez is a 60 y.o. year old male with medical history significant for Cardiac transplant on tacrolimus and prednisone, chronic combined systolic/diastolic CHF, GERD, high-grade T1 bladder cancer with recent hospitalization from 03/49-1/7 for AKI complicated by hydronephrosis in the setting of new retroperitoneal lymphadenopathy patient underwent stent placement by urology and was treated for postoperative multifocal pneumonia.  He presented from urology office with reports of increased back pain not well controlled on oral pain medication as well as generalized fatigue, poor appetite was brought in under observation due to concern for back pain in the setting of known retroperitoneal lymphadenopathy.  Hospital course complicated by acute hyponatremia in the setting of poor appetite requiring IV fluids and persistent leukocytosis.  CT abdomen imaging consistent with retroperitoneal adenopathy and dilation of right renal pelvis and collecting systems concerning for urothelial neoplasm with extensive metastatic process.  Subjective  Still having difficulty tolerating p.o. due to recurrent reflux, also complains of no bowel movements for the past week.  Continues to have back pain as well  A & P    Retroperitoneal adenopathy and perinephric nodularity with ongoing back pain.  Still having quite a bit of pain in his back.  Patient is neurologically intact with no urinary continence, no bowel incontinence.  CT imaging concerning for upper tract urothelial neoplasm, in setting of immunosuppression differential also includes posttransplant lymphoproliferative disorder/follow-up, patient has prior history of Hodgkin's  lymphoma.  Status post IR guided retroperitoneal biopsy on 1/6 - Follow biopsy results -continue neurochecks - IV morphine 1 mg every 2 hours as needed severe pain (he needed to start low to ensure good tolerance). - Optimize bowel regimen added lactulose and senna docusate  Constipation. Stool noted throughout the colon on CT abdomen.  Patient reports no bowel movement for the past week.  Likely related to opioid regimen for pain control. - Senna docusate-lactulose as needed (has taken response in the past)  Acute on chronic hyponatremia.   Does look dry on exam like related to diminished oral intake.  Slowly improving with IV fluids but still has quite diminished oral intake in the setting of ongoing reflux suspect multifactorial etiology including GERD, constipation, ascites, retroperitoneal lymphadenopathy.TSH, cortisol wnl.Nadir of 119 -Continue monitor BMP -continue normal saline  Diminished oral intake.  Likely complicated by retroperitoneal lymphadenopathy as well as some noted ascites though small volume on CT abdomen.  Suspect further complicated by poorly controlled reflux symptoms that are likely exacerbating in setting of his acute illness - Monitor on regular diet Diazepam as needed-continue IV fluids we will monitor ability to tolerate oral intake  GERD.  Last EGD 08/2020 within normal limits.  Reports increase reflux symptoms but no overt nausea or vomiting; however unable to tolerate p.o. intake.  Suspect exacerbated in the setting of retroperitoneal lymphadenopathy, small volume ascites, and ongoing constipation - Continue home supplied PPI (Aciphex) -We will discuss with pharmacology adding sucralfate  Leukocytosis, improving.  Discharge  WBC was 19.7, now 17.  Remains afebrile, -Daily CBC - Blood cultures if becomes febrile  Prior multifocal pneumonia hospitalization.  On room air.  Has no cough. -Complete cefdinir course from previous hospitalization, end date  1/10  Chronic combined systolic and diastolic CHF.  Prior EF of 15% in 2016 last EF on 10/2020 preserved with 60 to 65%.  No signs of volume overload on exam.  Actually looks a bit dry.-We will continue IV fluid for hyponatremia in the setting of diminished p.o. intake with close monitoring of volume status - Daily weights - Monitor ins and outs  Gout, stable -continue allopurinol  History of cardiac transplant.  Denies any chest pain. -Continue home supplied tacrolimus, prednisone  CKD stage II.   Most recent baseline creatinine 1.8 from last hospitalization.  Currently creatinine 2.06 Monitor output -avoid nephrotoxins.  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 Irene Limbo also followed by urology as outpatient, appreciate their assistance  Hypothyroidism, TSH stable -continue Synthroid        Family Communication  : None  Code Status : Full  Disposition Plan  :  Patient is from home. Anticipated d/c date: 2 to 3 days. Barriers to d/c or necessity for inpatient status:  Requires IV fluids for hyponatremia monitoring to improve ability to tolerate p.o. due to severe reflux, IV pain control  Persistent pain for retroperitoneal lymphadenopathy Consults  :  IR, oncology  Procedures  : IR guided biopsy on 9/6  DVT Prophylaxis  :  SCDS  MDM: The below labs and imaging reports were reviewed and summarized above.  Medication management as above.  Lab Results  Component Value Date   PLT 438 (H) 12/07/2020   PLT 440 (H) 12/07/2020    Diet :  Diet Order            DIET SOFT Room service appropriate? Yes; Fluid consistency: Thin  Diet effective now                  Inpatient Medications Scheduled Meds: . allopurinol  200 mg Oral Daily  . azelastine  1 spray Each Nare Daily   And  . fluticasone  1 spray Each Nare Daily  . cefdinir  300 mg Oral BID  . docusate sodium  100 mg Oral BID  . famotidine  20 mg  Oral BID  . levothyroxine  125 mcg Oral Daily  . loratadine  10 mg Oral Daily  . pantoprazole  40 mg Oral Daily  . predniSONE  5 mg Oral Daily  . senna-docusate  2 tablet Oral BID  . Tacrolimus ER  1.5 mg Oral Daily  . traZODone  100 mg Oral QHS   Continuous Infusions: . sodium chloride 100 mL/hr at 12/07/20 1008   PRN Meds:.acetaminophen **OR** acetaminophen, calcium carbonate, lactulose, magnesium hydroxide, morphine injection, ondansetron **OR** ondansetron (ZOFRAN) IV, oxyCODONE  Antibiotics  :   Anti-infectives (From admission, onward)   Start     Dose/Rate Route Frequency Ordered Stop   12/04/2020 2200  cefdinir (OMNICEF) capsule 300 mg        300 mg Oral 2 times daily 12/07/2020 1716 12/10/20 2159       Objective   Vitals:   12/06/20 2043 12/06/20 2324 12/07/20 0613 12/07/20 1207  BP: 132/88 132/81 (!) 149/86 (!) 145/84  Pulse: 95 (!) 108 97 95  Resp: 20 14 16 14   Temp: 99.2 F (37.3 C) 97.7 F (36.5 C) 99.3 F (37.4 C) 98.4 F (36.9 C)  TempSrc: Oral Oral Oral Oral  SpO2: 97% 99% 94% 98%  Weight:      Height:        SpO2: 98 % O2 Flow Rate (L/min): 2 L/min  Wt Readings  from Last 3 Encounters:  12/11/2020 102.1 kg  11/29/20 100.2 kg  08/31/20 101.2 kg     Intake/Output Summary (Last 24 hours) at 12/07/2020 1601 Last data filed at 12/07/2020 3664 Gross per 24 hour  Intake 1232.6 ml  Output 1970 ml  Net -737.4 ml    Physical Exam:     Awake Alert, Oriented X 3, Normal affect Dry oral mucosa Able to move all extremities without any focal deficits appreciated [place].AT, Normal respiratory effort on room air, CTAB RRR,No Gallops,Rubs or new Murmurs,  +ve B.Sounds, Abd Soft and distended, No tenderness, No rebound, guarding or rigidity. No Cyanosis, No new Rash or bruise    I have personally reviewed the following:   Data Reviewed:  CBC Recent Labs  Lab 12/02/20 0522 12/03/20 0520 12/03/2020 1923 12/06/20 0519 12/06/20 1756 12/07/20 0503  WBC  19.8* 19.7* 19.7* 18.1* 18.4* 17.1*  17.2*  HGB 12.9* 14.4 14.1 13.5 13.6 13.9  13.7  HCT 40.2 42.2 42.0 39.5 40.4 40.8  40.6  PLT 409* 460* 418* 459* 446* 438*  440*  MCV 97.8 95.3 94.0 91.9 94.2 94.2  94.2  MCH 31.4 32.5 31.5 31.4 31.7 32.1  31.8  MCHC 32.1 34.1 33.6 34.2 33.7 34.1  33.7  RDW 16.0* 15.4 14.7 14.8 14.9 15.0  15.1  LYMPHSABS 0.6* 0.8 0.8  --   --   --   MONOABS 2.0* 1.8* 2.0*  --   --   --   EOSABS 0.1 0.1 0.0  --   --   --   BASOSABS 0.1 0.1 0.1  --   --   --     Chemistries  Recent Labs  Lab 12/23/2020 0639 12/02/20 0522 12/03/20 0520 12/31/2020 1923 12/06/20 0519 12/06/20 1020 12/07/20 0831 12/07/20 1310  NA 130* 132* 130* 119* 121* 124* 126* 126*  K 4.3 3.8 3.7 4.2 3.7 4.0 4.0 3.8  CL 97* 97* 93* 82* 85* 88* 92* 91*  CO2 23 23 21* 25 22 23 22  21*  GLUCOSE 120* 102* 105* 106* 81 93 90 93  BUN 32* 24* 22* 32* 30* 32* 31* 34*  CREATININE 2.34* 1.97* 1.87* 1.81* 1.87* 2.01* 2.06* 2.06*  CALCIUM 8.5* 8.6* 9.3 9.2 8.6* 8.8* 8.4* 8.3*  MG  --  1.7 1.5* 1.7  --   --   --   --   AST 20  --   --  22 21  --   --   --   ALT 10  --   --  12 11  --   --   --   ALKPHOS 61  --   --  58 54  --   --   --   BILITOT 0.6  --   --  0.7 0.9  --   --   --    ------------------------------------------------------------------------------------------------------------------ No results for input(s): CHOL, HDL, LDLCALC, TRIG, CHOLHDL, LDLDIRECT in the last 72 hours.  No results found for: HGBA1C ------------------------------------------------------------------------------------------------------------------ Recent Labs    12/06/20 0519  TSH 3.931   ------------------------------------------------------------------------------------------------------------------ No results for input(s): VITAMINB12, FOLATE, FERRITIN, TIBC, IRON, RETICCTPCT in the last 72 hours.  Coagulation profile No results for input(s): INR, PROTIME in the last 168 hours.  No results for input(s):  DDIMER in the last 72 hours.  Cardiac Enzymes No results for input(s): CKMB, TROPONINI, MYOGLOBIN in the last 168 hours.  Invalid input(s): CK ------------------------------------------------------------------------------------------------------------------    Component Value Date/Time   BNP 121.1 (H) 11/30/2020 0031  Micro Results Recent Results (from the past 240 hour(s))  SARS Coronavirus 2 by RT PCR (hospital order, performed in [facility] hospital lab) Nasopharyngeal Nasopharyngeal Swab     Status: None   Collection Time: 11/29/20  2:45 PM   Specimen: Nasopharyngeal Swab  Result Value Ref Range Status   SARS Coronavirus 2 NEGATIVE NEGATIVE Final    Comment: (NOTE) SARS-CoV-2 target nucleic acids are NOT DETECTED.  The SARS-CoV-2 RNA is generally detectable in upper and lower respiratory specimens during the acute phase of infection. The lowest concentration of SARS-CoV-2 viral copies this assay can detect is 250 copies / mL. A negative result does not preclude SARS-CoV-2 infection and should not be used as the sole basis for treatment or other patient management decisions.  A negative result may occur with improper specimen collection / handling, submission of specimen other than nasopharyngeal swab, presence of viral mutation(s) within the areas targeted by this assay, and inadequate number of viral copies (<250 copies / mL). A negative result must be combined with clinical observations, patient history, and epidemiological information.  Fact Sheet for Patients:   StrictlyIdeas.no  Fact Sheet for Healthcare Providers: BankingDealers.co.za  This test is not yet approved or  cleared by the [place] FDA and has been authorized for detection and/or diagnosis of SARS-CoV-2 by FDA under an Emergency Use Authorization (EUA).  This EUA will remain in effect (meaning this test can be used) for the duration of the COVID-19  declaration under Section 564(b)(1) of the Act, 21 U.S.C. section 360bbb-3(b)(1), unless the authorization is terminated or revoked sooner.  Performed at [facility], [place] [address] [place], [place] [address]   Urine Culture     Status: None   Collection Time: 11/29/20  6:35 PM   Specimen: PATH Other; Tissue  Result Value Ref Range Status   Specimen Description   Final    URINE, RANDOM Performed at [facility] [address] [place], [place] [address]    Special Requests   Final    NONE Performed at [facility], [place] [address] [place], [place] [address]    Culture   Final    NO GROWTH Performed at [facility], [place] [address] [place], [place] [address]    Report Status 12/03/2020 FINAL  Final  Culture, blood (x 2)     Status: None   Collection Time: 11/30/20 12:31 AM   Specimen: BLOOD  Result Value Ref Range Status   Specimen Description   Final    BLOOD RIGHT HAND Performed at [facility] [address], [place], [place] [address]    Special Requests   Final    BOTTLES DRAWN AEROBIC ONLY Blood Culture adequate volume Performed at [facility] [address], [place], [place] [address]    Culture   Final    NO GROWTH 5 DAYS Performed at [facility], [place] [address], [place], [place] [address]    Report Status 12/16/2020 FINAL  Final  Culture, blood (x 2)     Status: None   Collection Time: 11/30/20 12:31 AM   Specimen: BLOOD  Result Value Ref Range Status   Specimen Description   Final    BLOOD LEFT HAND Performed at [facility] [address], [place], [place] [address]    Special Requests   Final    BOTTLES DRAWN AEROBIC ONLY Blood Culture adequate volume Performed at [facility], [place] [address] [place], [place] [address]    Culture   Final    NO GROWTH 5 DAYS Performed at [facility], [place] [address] [place], [place] [address]    Report Status 12/16/2020 FINAL  Final  Resp Panel by RT-PCR (Flu A&B, Covid) Nasopharyngeal Swab     Status: None   Collection Time: 11/30/20  4:30 AM   Specimen: Nasopharyngeal Swab; Nasopharyngeal(NP) swabs in vial transport medium  Result Value Ref Range Status   SARS Coronavirus 2 by RT PCR NEGATIVE NEGATIVE Final    Comment: (NOTE) SARS-CoV-2 target nucleic acids are NOT DETECTED.  The SARS-CoV-2 RNA is generally detectable in upper respiratory specimens during the acute phase of infection. The lowest concentration of SARS-CoV-2 viral copies this assay can detect is 138 copies/mL. A negative result does not preclude SARS-Cov-2 infection and should not be used as the sole basis for treatment or other patient management decisions. A negative result may occur with  improper specimen collection/handling, submission of specimen other than nasopharyngeal swab, presence of viral mutation(s) within the areas targeted by this assay, and inadequate number of viral copies(<138 copies/mL). A negative result must be combined with clinical observations, patient history, and epidemiological information. The expected result is Negative.  Fact Sheet for Patients:  EntrepreneurPulse.com.au  Fact Sheet for Healthcare Providers:  IncredibleEmployment.be  This test is no t yet approved or cleared by the [place] FDA and  has been authorized for detection and/or diagnosis of SARS-CoV-2 by FDA under an Emergency Use Authorization (EUA). This EUA will remain  in effect (meaning this test can be used) for the duration of the COVID-19 declaration under Section 564(b)(1) of the Act, 21 U.S.C.section 360bbb-3(b)(1), unless the authorization is terminated  or revoked sooner.       Influenza A by PCR NEGATIVE NEGATIVE Final   Influenza B by PCR NEGATIVE NEGATIVE Final    Comment: (NOTE) The Xpert Xpress SARS-CoV-2/FLU/RSV  plus assay is intended as an aid in the diagnosis of influenza from Nasopharyngeal swab specimens and should not be used as a sole basis for treatment. Nasal washings and aspirates are unacceptable for Xpert Xpress SARS-CoV-2/FLU/RSV testing.  Fact Sheet for Patients: EntrepreneurPulse.com.au  Fact Sheet for Healthcare Providers: IncredibleEmployment.be  This test is not yet approved or cleared by the [place] FDA and has been authorized for detection and/or diagnosis of SARS-CoV-2 by FDA under an Emergency Use Authorization (EUA). This EUA will remain in effect (meaning this test can be used) for the duration of the COVID-19 declaration under Section 564(b)(1) of the Act, 21 U.S.C. section 360bbb-3(b)(1), unless the authorization is terminated or revoked.  Performed at [facility], [place] [address] [place], [place] [address]   MRSA PCR Screening     Status: None   Collection Time: 11/30/20 10:22 AM   Specimen: Nasopharyngeal  Result Value Ref Range Status   MRSA by PCR NEGATIVE NEGATIVE Final    Comment:        The GeneXpert MRSA Assay (FDA approved for NASAL specimens only), is one component of a comprehensive MRSA colonization surveillance program. It is not intended to diagnose MRSA infection nor to guide or monitor treatment for MRSA infections. Performed at [facility], [place] [address], [place], [place] [address]   SARS CORONAVIRUS 2 (TAT 6-24 HRS) Nasopharyngeal Nasopharyngeal Swab     Status: None   Collection Time: 12/12/2020  1:48 PM   Specimen: Nasopharyngeal Swab  Result Value  Ref Range Status   SARS Coronavirus 2 NEGATIVE NEGATIVE Final    Comment: (NOTE) SARS-CoV-2 target nucleic acids are NOT DETECTED.  The SARS-CoV-2 RNA is generally detectable in upper and lower respiratory specimens during the acute phase of infection. Negative results do not preclude SARS-CoV-2 infection,  do not rule out co-infections with other pathogens, and should not be used as the sole basis for treatment or other patient management decisions. Negative results must be combined with clinical observations, patient history, and epidemiological information. The expected result is Negative.  Fact Sheet for Patients: SugarRoll.be  Fact Sheet for Healthcare Providers: https://www.woods-mathews.com/  This test is not yet approved or cleared by the [place] FDA and  has been authorized for detection and/or diagnosis of SARS-CoV-2 by FDA under an Emergency Use Authorization (EUA). This EUA will remain  in effect (meaning this test can be used) for the duration of the COVID-19 declaration under Se ction 564(b)(1) of the Act, 21 U.S.C. section 360bbb-3(b)(1), unless the authorization is terminated or revoked sooner.  Performed at [facility], [place] [address] [place], [place] [address]     Radiology Reports CT ABDOMEN PELVIS WO CONTRAST  Result Date: 12/15/2020 CLINICAL DATA:  Cancer of unknown primary in this 60 year old male. EXAM: CT CHEST, ABDOMEN AND PELVIS WITHOUT CONTRAST TECHNIQUE: Multidetector CT imaging of the chest, abdomen and pelvis was performed following the standard protocol without IV contrast. COMPARISON:  CT abdomen and pelvis of the same date. Also with CT of December 30th of 2021 FINDINGS: CT CHEST FINDINGS Cardiovascular: Calcified atheromatous plaque in the thoracic aorta. Heart size is normal following sternotomy for heart transplant by report a [facility] medical center. Three-vessel coronary artery calcification. No pericardial effusion. Calcifications in the LEFT atrium partially seen on previous imaging are of uncertain significance central pulmonary vasculature also with some signs of calcification. Mediastinum/Nodes: Ovoid structure associated with LEFT thyroid 11 mm similar to previous imaging, cervical spine  from 2017. Thyroid as described above. Calcifications in the LEFT supraclavicular region may be within venous structures on the LEFT. No axillary lymphadenopathy. Surgical clips in the RIGHT axilla and soft tissue thickening over the LEFT pectoralis musculature similar grossly compared to remote MRI evaluation 2018 and surgical clips seen on numerous priors in the RIGHT axilla. No mediastinal lymphadenopathy. No gross hilar lymphadenopathy. Lungs/Pleura: Small RIGHT of pleural effusion and basilar airspace disease. Small nodule in the superior aspect of the RIGHT middle lobe approximately 3 mm on image 57 of series 5. Biapical scarring. Small nodule in the LEFT upper lobe 6 mm (image 51, series 5) airways are patent. Musculoskeletal: Evidence ir sternotomy without significant bony bridging but with sclerotic margins along the sternotomy line similar to studies dating back to August of 2021 with respect to visualized portions on prior abdomen studies no destructive bone finding or acute bone process. CT ABDOMEN PELVIS FINDINGS Hepatobiliary: 1.9 cm lesion in the RIGHT hepatic lobe near the dome of the RIGHT hemi liver does not display attenuation values of simple cyst is unchanged from the study acquired on the same date at another facility. Additionally a 1.4 cm lesion in the RIGHT hepatic lobe on image 23 of series 2 is unchanged. Sludge in the gallbladder. Tiny low-density foci in the LEFT hepatic lobe without change 1 on image 19 of series 2 in the lateral segment measuring 5 mm and another in the medial segment of the LEFT hepatic lobe measuring 12 mm on image 21 of series 2 these areas  appears similar dating back to August of 2020, lesions in the RIGHT hepatic lobe are new. Pancreas: Normal pancreatic contour without signs of inflammation. Spleen: Post splenectomy. Adrenals/Urinary Tract: Adrenal glands, normal on the LEFT and obscured on the RIGHT by perinephric and Peri adrenal nodularity. Fullness of the  RIGHT renal pelvis with nephroureteral stent in place showing soft tissue density with distension of the renal pelvis and calices similar to the study acquired on the same date. Nephrolithiasis also unchanged. Perinephric nodularity along the medial upper pole the LEFT kidney measures 6.4 x 2.7 cm which is within 1-2 mm of its previous measurement when measured by this observer on the prior study. Increased in size when compared to the study of November 30, 2020. Renal cortical scarring on the LEFT with nephrolithiasis in the lower pole unchanged from very recent comparison. Urinary bladder with small amount of gas in the urinary bladder with distal aspect of the stent in place, loop coiled in the urinary bladder. Stomach/Bowel: Scattered colonic diverticulosis without acute gastrointestinal process. Normal appendix. Stool and contrast throughout the colon. Vascular/Lymphatic: Calcified atheromatous plaque in the abdominal aorta without aneurysmal dilation. Retroperitoneal adenopathy unchanged from scan earlier the same day, largest lymph nodes behind the inferior vena cava and in the intra-aortocaval groove and in the upper abdomen, largest on image 41 of series 2 measuring 1.9 cm. Smaller lymph nodes along the LEFT periaortic chain. Postoperative changes in the retroperitoneum associated with previous lymphadenectomy No pelvic lymphadenopathy. Reproductive: Prostate with uro lift device deployment bilaterally similar to recent priors. Other: Extensive retroperitoneal fascial thickening/stranding extending into the root of the small bowel mesentery, nodular changes associated with this thickening on the RIGHT outlining the renal fascia this crosses the midline where it is without significant nodularity but with fascial thickening and stranding, also tracking into the pelvis. Small amount of fluid in the pelvis. Musculoskeletal: Spinal degenerative changes. No destructive bone process or acute bone finding.  IMPRESSION: 1. Retroperitoneal adenopathy and perinephric nodularity with dilation of the RIGHT renal pelvis and collecting systems, filled with what is suspected to represent neoplasm. 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 Differential considerations given heart transplantation and presumed immunosuppression would include posttransplant lymphoproliferative disorder/lymphoma. 2. Dense material in collecting systems may represent a mixture of tumor and or blood and there is profound dilation of collecting systems of the RIGHT kidney. This is unchanged compared to the recent comparison study. 3. Signs of hepatic involvement with new lesions in the RIGHT hepatic lobe. 4. Ascites with small amount of ascites with very subtle infiltration of fat in the omentum not mentioned above, for instance on image 50 of series 2 raising the question of peritoneal involvement as well. This area is in the range of 2-3 mm. 5. Small RIGHT pleural effusion and basilar airspace disease. 6. Postoperative changes about the chest likely related to prior heart transplantation with surgical clips in the RIGHT axilla and thickening and calcification along the anterior pectoralis musculature not changed since 2018. 7. Calcification in the LEFT atrium also about the aortic root and pulmonary artery and to a lesser degree in the RIGHT atrium likely reflects changes of vascular anastomotic sites in the setting of heart transplantation. 8. 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 9. Post splenectomy. 10. Aortic atherosclerosis. 11. Small nodule in the LEFT upper lobe 6 mm airways are patent. These results will  be called to the ordering clinician or representative by the Radiologist Assistant, and communication documented in the PACS or [employer]. Aortic  Atherosclerosis (ICD10-I70.0). Electronically Signed   By: Zetta Bills M.D.   On: 12/09/2020 17:38   DG Chest 1 View  Result Date: 11/29/2020 CLINICAL DATA:  Hypoxia EXAM: CHEST  1 VIEW COMPARISON:  08/03/2020 FINDINGS: Since the prior examination, there has developed right basilar consolidation, possibly infectious in the appropriate clinical setting. Mild focal infiltrate is also noted within the right apex. Left lung is clear. No pneumothorax or pleural effusion. Median sternotomy has been performed. Cardiac size within normal limits. Multiple healed right rib fractures are noted. Multiple surgical clips are seen within the right axilla. IMPRESSION: Interval development of multifocal pulmonary infiltrate within the right lung, more focal within the right lung base, suspicious for atypical infection in the acute setting. Electronically Signed   By: Fidela Salisbury MD   On: 11/29/2020 22:27   DG Chest 2 View  Result Date: 12/20/2020 CLINICAL DATA:  Bladder cancer.  Follow-up multifocal pneumonia. EXAM: CHEST - 2 VIEW COMPARISON:  11/29/2020 FINDINGS: Prior median sternotomy. Heart is normal size. Patchy right lung airspace disease in the right apex and right lower lung. Minimal left base linear atelectasis or scarring. Overall aeration in the right lung slightly improved. No effusions. IMPRESSION: Patchy right lung airspace disease with slight improvement since prior study. Left base atelectasis or scarring. Electronically Signed   By: Rolm Baptise M.D.   On: 12/24/2020 13:25   CT CHEST WO CONTRAST  Result Date: 12/22/2020 CLINICAL DATA:  Cancer of unknown primary in this 60 year old male. EXAM: CT CHEST, ABDOMEN AND PELVIS WITHOUT CONTRAST TECHNIQUE: Multidetector CT imaging of the chest, abdomen and pelvis was performed following the standard protocol without IV contrast. COMPARISON:  CT abdomen and pelvis of the same date. Also with CT of December 30th of 2021 FINDINGS: CT CHEST FINDINGS  Cardiovascular: Calcified atheromatous plaque in the thoracic aorta. Heart size is normal following sternotomy for heart transplant by report a [facility]. Three-vessel coronary artery calcification. No pericardial effusion. Calcifications in the LEFT atrium partially seen on previous imaging are of uncertain significance central pulmonary vasculature also with some signs of calcification. Mediastinum/Nodes: Ovoid structure associated with LEFT thyroid 11 mm similar to previous imaging, cervical spine from 2017. Thyroid as described above. Calcifications in the LEFT supraclavicular region may be within venous structures on the LEFT. No axillary lymphadenopathy. Surgical clips in the RIGHT axilla and soft tissue thickening over the LEFT pectoralis musculature similar grossly compared to remote MRI evaluation 2018 and surgical clips seen on numerous priors in the RIGHT axilla. No mediastinal lymphadenopathy. No gross hilar lymphadenopathy. Lungs/Pleura: Small RIGHT of pleural effusion and basilar airspace disease. Small nodule in the superior aspect of the RIGHT middle lobe approximately 3 mm on image 57 of series 5. Biapical scarring. Small nodule in the LEFT upper lobe 6 mm (image 51, series 5) airways are patent. Musculoskeletal: Evidence ir sternotomy without significant bony bridging but with sclerotic margins along the sternotomy line similar to studies dating back to August of 2021 with respect to visualized portions on prior abdomen studies no destructive bone finding or acute bone process. CT ABDOMEN PELVIS FINDINGS Hepatobiliary: 1.9 cm lesion in the RIGHT hepatic lobe near the dome of the RIGHT hemi liver does not display attenuation values of simple cyst is unchanged from the study acquired on the same date at another facility. Additionally a 1.4 cm  lesion in the RIGHT hepatic lobe on image 23 of series 2 is unchanged. Sludge in the gallbladder. Tiny low-density foci in the LEFT hepatic  lobe without change 1 on image 19 of series 2 in the lateral segment measuring 5 mm and another in the medial segment of the LEFT hepatic lobe measuring 12 mm on image 21 of series 2 these areas appears similar dating back to August of 2020, lesions in the RIGHT hepatic lobe are new. Pancreas: Normal pancreatic contour without signs of inflammation. Spleen: Post splenectomy. Adrenals/Urinary Tract: Adrenal glands, normal on the LEFT and obscured on the RIGHT by perinephric and Peri adrenal nodularity. Fullness of the RIGHT renal pelvis with nephroureteral stent in place showing soft tissue density with distension of the renal pelvis and calices similar to the study acquired on the same date. Nephrolithiasis also unchanged. Perinephric nodularity along the medial upper pole the LEFT kidney measures 6.4 x 2.7 cm which is within 1-2 mm of its previous measurement when measured by this observer on the prior study. Increased in size when compared to the study of November 30, 2020. Renal cortical scarring on the LEFT with nephrolithiasis in the lower pole unchanged from very recent comparison. Urinary bladder with small amount of gas in the urinary bladder with distal aspect of the stent in place, loop coiled in the urinary bladder. Stomach/Bowel: Scattered colonic diverticulosis without acute gastrointestinal process. Normal appendix. Stool and contrast throughout the colon. Vascular/Lymphatic: Calcified atheromatous plaque in the abdominal aorta without aneurysmal dilation. Retroperitoneal adenopathy unchanged from scan earlier the same day, largest lymph nodes behind the inferior vena cava and in the intra-aortocaval groove and in the upper abdomen, largest on image 41 of series 2 measuring 1.9 cm. Smaller lymph nodes along the LEFT periaortic chain. Postoperative changes in the retroperitoneum associated with previous lymphadenectomy No pelvic lymphadenopathy. Reproductive: Prostate with uro lift device deployment  bilaterally similar to recent priors. Other: Extensive retroperitoneal fascial thickening/stranding extending into the root of the small bowel mesentery, nodular changes associated with this thickening on the RIGHT outlining the renal fascia this crosses the midline where it is without significant nodularity but with fascial thickening and stranding, also tracking into the pelvis. Small amount of fluid in the pelvis. Musculoskeletal: Spinal degenerative changes. No destructive bone process or acute bone finding. IMPRESSION: 1. Retroperitoneal adenopathy and perinephric nodularity with dilation of the RIGHT renal pelvis and collecting systems, filled with what is suspected to represent neoplasm. 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 Differential considerations given heart transplantation and presumed immunosuppression would include posttransplant lymphoproliferative disorder/lymphoma. 2. Dense material in collecting systems may represent a mixture of tumor and or blood and there is profound dilation of collecting systems of the RIGHT kidney. This is unchanged compared to the recent comparison study. 3. Signs of hepatic involvement with new lesions in the RIGHT hepatic lobe. 4. Ascites with small amount of ascites with very subtle infiltration of fat in the omentum not mentioned above, for instance on image 50 of series 2 raising the question of peritoneal involvement as well. This area is in the range of 2-3 mm. 5. Small RIGHT pleural effusion and basilar airspace disease. 6. Postoperative changes about the chest likely related to prior heart transplantation with surgical clips in the RIGHT axilla and thickening and calcification along the anterior pectoralis musculature not changed since 2018. 7. Calcification in the LEFT atrium also about the aortic root and pulmonary  artery and to a lesser degree in the RIGHT  atrium likely reflects changes of vascular anastomotic sites in the setting of heart transplantation. 8. 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 9. Post splenectomy. 10. Aortic atherosclerosis. 11. Small nodule in the LEFT upper lobe 6 mm airways are patent. These results will be called to the ordering clinician or representative by the Radiologist Assistant, and communication documented in the PACS or [employer]. Aortic Atherosclerosis (ICD10-I70.0). Electronically Signed   By: Zetta Bills M.D.   On: 12/15/2020 17:38   NM Pulmonary Perfusion  Result Date: 12/19/2020 CLINICAL DATA:  Respiratory failure EXAM: NUCLEAR MEDICINE PERFUSION LUNG SCAN TECHNIQUE: Perfusion images were obtained in multiple projections after intravenous injection of radiopharmaceutical. Ventilation scans intentionally deferred if perfusion scan and chest x-ray adequate for interpretation during COVID 19 epidemic. RADIOPHARMACEUTICALS:  4.1 mCi Tc-48m MAA IV COMPARISON:  Chest x-ray 12/28/2020 FINDINGS: Slightly heterogeneous distribution of radiotracer within the lung fields. Multiple small bilateral wedge-shaped perfusion defects. No corresponding radiographic abnormality. No large mismatched segmental perfusion defect. IMPRESSION: Intermediate probability for pulmonary embolism. Electronically Signed   By: Davina Poke D.O.   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 ANESTHESIA/SEDATION: Fentanyl 100 mcg IV; Versed 4 mg IV Sedation time: 14 minutes; The patient was continuously monitored during the procedure by the interventional radiology nurse under my direct supervision. CONTRAST:  None. COMPLICATIONS: SIR Level A -  No therapy, no consequence. Retroperitoneal hemorrhage at site of biopsy, controlled with Gel-Foam slurry administration along needle track. PROCEDURE: Informed consent was obtained from the patient following an explanation of the procedure, risks, benefits and alternatives. A time out was performed prior to the initiation of the procedure. The patient was positioned in a partial left lateral decubitus position on the CT table and a limited CT was performed for procedural planning demonstrating similar appearing multifocal right retroperitoneal lymphadenopathy. The procedure was planned. The operative site was prepped and draped in the usual sterile fashion. Appropriate trajectory was confirmed with a 22 gauge spinal needle after the adjacent tissues were anesthetized with 1% Lidocaine with epinephrine. Under intermittent CT guidance, a 17 gauge coaxial needle was advanced into the peripheral aspect of the mass. Appropriate positioning was confirmed and a total of 4 samples were obtained with an 18 gauge core needle biopsy device. A limited CT demonstrated focal hemorrhage about the biopsied lymph node. Gel-Foam slurry was injected through the introducer needle along the needle track. The co-axial needle was removed and hemostasis was achieved with manual compression. Additional limited postprocedural CT was negative for expanding hemorrhage or additional complication. A dressing was placed. The patient tolerated the procedure well without immediate postprocedural complication. IMPRESSION: Technically successful CT guided core needle biopsy of right retroperitoneal lymph node. Ruthann Cancer, MD [facility] Electronically Signed   By: Ruthann Cancer MD   On: 12/07/2020 08:53   DG C-Arm 1-60 Min-No Report  Result Date: 11/29/2020 Fluoroscopy was utilized by the requesting physician.  No radiographic interpretation.   ECHOCARDIOGRAM COMPLETE  Result  Date: 11/30/2020    ECHOCARDIOGRAM REPORT   Patient Name:   Glenn Gonzalez Date of Exam: 11/30/2020 Medical Rec #:  [id number]         Height:  74.0 in Accession #:    [id number]        Weight:       221.0 lb Date of Birth:  Dec 14, 1960        BSA:          2.268 m Patient Age:    78 years          BP:           103/59 mmHg Patient Gender: M                 HR:           94 bpm. Exam Location:  Inpatient Procedure: 2D Echo, Cardiac Doppler, Color Doppler and Intracardiac            Opacification Agent Indications:    Dyspnea R06.00  History:        Patient has prior history of Echocardiogram examinations, most                 recent 05/12/2017. Arrythmias:Atrial Fibrillation; Risk                 Factors:Non-Smoker. GERD. 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  Sonographer:    Vickie Epley RDCS Referring Phys: [id number] [facility]  Sonographer Comments: Suboptimal apical window and suboptimal subcostal window. Pt unable to turn on side due to herniated discs. IMPRESSIONS  1. Left ventricular ejection fraction, by estimation, is 60 to 65%. The left ventricle has normal function. The left ventricle has no regional wall motion abnormalities. Left ventricular diastolic parameters were normal.  2. Right ventricular systolic function is normal. The right ventricular size is normal. There is normal pulmonary artery systolic pressure. The estimated right ventricular systolic pressure is 55.7 mmHg.  3. The mitral valve is normal in structure. No evidence of mitral valve regurgitation. No evidence of mitral stenosis.  4. The aortic valve is normal in structure. Aortic valve regurgitation is not visualized. No aortic stenosis is present.  5. The inferior vena cava is normal in size with greater than 50% respiratory variability, suggesting right atrial pressure of 3 mmHg. FINDINGS  Left Ventricle: Left ventricular ejection fraction, by estimation, is 60 to 65%. The left ventricle has normal function. The left ventricle has no  regional wall motion abnormalities. Definity contrast agent was given IV to delineate the left ventricular  endocardial borders. The left ventricular internal cavity size was normal in size. There is no left ventricular hypertrophy. Left ventricular diastolic parameters were normal. Normal left ventricular filling pressure. Right Ventricle: The right ventricular size is normal. No increase in right ventricular wall thickness. Right ventricular systolic function is normal. There is normal pulmonary artery systolic pressure. The tricuspid regurgitant velocity is 2.39 m/s, and  with an assumed right atrial pressure of 3 mmHg, the estimated right ventricular systolic pressure is 32.2 mmHg. Left Atrium: Left atrial size was normal in size. Right Atrium: Right atrial size was normal in size. Pericardium: There is no evidence of pericardial effusion. Mitral Valve: The mitral valve is normal in structure. No evidence of mitral valve regurgitation. No evidence of mitral valve stenosis. Tricuspid Valve: The tricuspid valve is normal in structure. Tricuspid valve regurgitation is mild . No evidence of tricuspid stenosis. Aortic Valve: The aortic valve is normal in structure. Aortic valve regurgitation is not visualized. No aortic stenosis is present. Pulmonic Valve: The pulmonic valve was normal in structure. Pulmonic valve regurgitation is not visualized. No evidence of pulmonic stenosis. Aorta: The aortic root is normal  in size and structure. Venous: The inferior vena cava is normal in size with greater than 50% respiratory variability, suggesting right atrial pressure of 3 mmHg. IAS/Shunts: No atrial level shunt detected by color flow Doppler.  LEFT VENTRICLE PLAX 2D LVIDd:         4.40 cm     Diastology LVIDs:         3.20 cm     LV e' medial:    10.00 cm/s LV PW:         0.90 cm     LV E/e' medial:  7.5 LV IVS:        0.90 cm     LV e' lateral:   11.30 cm/s LVOT diam:     2.50 cm     LV E/e' lateral: 6.7 LV SV:         48  LV SV Index:   21 LVOT Area:     4.91 cm  LV Volumes (MOD) LV vol d, MOD A4C: 80.2 ml LV vol s, MOD A4C: 30.4 ml LV SV MOD A4C:     80.2 ml LEFT ATRIUM           Index LA diam:      2.70 cm 1.19 cm/m LA Vol (A4C): 16.3 ml 7.19 ml/m  AORTIC VALVE LVOT Vmax:   50.20 cm/s LVOT Vmean:  36.700 cm/s LVOT VTI:    0.099 m  AORTA Ao Root diam: 3.40 cm MITRAL VALVE               TRICUSPID VALVE MV Area (PHT): 5.38 cm    TR Peak grad:   22.8 mmHg MV Decel Time: 141 msec    TR Vmax:        239.00 cm/s MV E velocity: 75.40 cm/s MV A velocity: 36.00 cm/s  SHUNTS MV E/A ratio:  2.09        Systemic VTI:  0.10 m                            Systemic Diam: 2.50 cm Dani Gobble Croitoru MD Electronically signed by Sanda Klein MD Signature Date/Time: 11/30/2020/12:30:40 PM    Final    VAS [place] LOWER EXTREMITY VENOUS (DVT)  Result Date: 12/03/2020  Lower Venous DVT Study Indications: Pain.  Comparison Study: Previous 12/2019 Performing Technologist: Vonzell Schlatter RVT  Examination Guidelines: A complete evaluation includes B-mode imaging, spectral Doppler, color Doppler, and power Doppler as needed of all accessible portions of each vessel. Bilateral testing is considered an integral part of a complete examination. Limited examinations for reoccurring indications may be performed as noted. The reflux portion of the exam is performed with the patient in reverse Trendelenburg.  +---------+---------------+---------+-----------+----------+--------------+ RIGHT    CompressibilityPhasicitySpontaneityPropertiesThrombus Aging +---------+---------------+---------+-----------+----------+--------------+ CFV      Full           Yes      Yes                                 +---------+---------------+---------+-----------+----------+--------------+ SFJ      Full                                                        +---------+---------------+---------+-----------+----------+--------------+  FV Prox  Full                                                         +---------+---------------+---------+-----------+----------+--------------+ FV Mid   Full                                                        +---------+---------------+---------+-----------+----------+--------------+ FV DistalFull                                                        +---------+---------------+---------+-----------+----------+--------------+ PFV      Full                                                        +---------+---------------+---------+-----------+----------+--------------+ POP      Full           Yes      Yes                                 +---------+---------------+---------+-----------+----------+--------------+ PTV      Full                                                        +---------+---------------+---------+-----------+----------+--------------+ PERO     Full                                                        +---------+---------------+---------+-----------+----------+--------------+   +---------+---------------+---------+-----------+----------+--------------+ LEFT     CompressibilityPhasicitySpontaneityPropertiesThrombus Aging +---------+---------------+---------+-----------+----------+--------------+ CFV      Full           Yes      Yes                                 +---------+---------------+---------+-----------+----------+--------------+ SFJ      Full                                                        +---------+---------------+---------+-----------+----------+--------------+ FV Prox  Full                                                        +---------+---------------+---------+-----------+----------+--------------+  FV Mid   Full                                                        +---------+---------------+---------+-----------+----------+--------------+ FV DistalFull                                                         +---------+---------------+---------+-----------+----------+--------------+ PFV      Full                                                        +---------+---------------+---------+-----------+----------+--------------+ POP      Full           Yes      Yes                                 +---------+---------------+---------+-----------+----------+--------------+ PTV      Full                                                        +---------+---------------+---------+-----------+----------+--------------+ PERO     Full                                                        +---------+---------------+---------+-----------+----------+--------------+     Summary: RIGHT: - There is no evidence of deep vein thrombosis in the lower extremity.  - No cystic structure found in the popliteal fossa.  LEFT: - There is no evidence of deep vein thrombosis in the lower extremity.  - No cystic structure found in the popliteal fossa.  *See table(s) above for measurements and observations. Electronically signed by Ruta Hinds MD on 12/03/2020 at 12:15:41 PM.    Final      Time Spent in minutes  30     Desiree Hane M.D on 12/07/2020 at 4:01 PM  To page go to [email or web address] - password [email or web address]

## 2020-12-07 NOTE — Progress Notes (Signed)
Pt refused all meds except colace, stating that he has his own supply at bedside and takes what he would typically take at home. Per nursing report, MD aware.

## 2020-12-07 NOTE — Progress Notes (Signed)
Biopsy dsg remain dry and intact.

## 2020-12-08 DIAGNOSIS — M545 Low back pain, unspecified: Secondary | ICD-10-CM | POA: Diagnosis not present

## 2020-12-08 DIAGNOSIS — N179 Acute kidney failure, unspecified: Secondary | ICD-10-CM | POA: Diagnosis not present

## 2020-12-08 DIAGNOSIS — N189 Chronic kidney disease, unspecified: Secondary | ICD-10-CM | POA: Diagnosis not present

## 2020-12-08 DIAGNOSIS — E871 Hypo-osmolality and hyponatremia: Secondary | ICD-10-CM | POA: Diagnosis not present

## 2020-12-08 LAB — CBC
HCT: 39 % (ref 39.0–52.0)
Hemoglobin: 13.1 g/dL (ref 13.0–17.0)
MCH: 32.2 pg (ref 26.0–34.0)
MCHC: 33.6 g/dL (ref 30.0–36.0)
MCV: 95.8 fL (ref 80.0–100.0)
Platelets: 446 10*3/uL — ABNORMAL HIGH (ref 150–400)
RBC: 4.07 MIL/uL — ABNORMAL LOW (ref 4.22–5.81)
RDW: 15.4 % (ref 11.5–15.5)
WBC: 19.3 10*3/uL — ABNORMAL HIGH (ref 4.0–10.5)
nRBC: 0 % (ref 0.0–0.2)

## 2020-12-08 LAB — BASIC METABOLIC PANEL
Anion gap: 10 (ref 5–15)
BUN: 34 mg/dL — ABNORMAL HIGH (ref 6–20)
CO2: 22 mmol/L (ref 22–32)
Calcium: 8.5 mg/dL — ABNORMAL LOW (ref 8.9–10.3)
Chloride: 95 mmol/L — ABNORMAL LOW (ref 98–111)
Creatinine, Ser: 2.07 mg/dL — ABNORMAL HIGH (ref 0.61–1.24)
GFR, Estimated: 36 mL/min — ABNORMAL LOW (ref >60)
Glucose, Bld: 104 mg/dL — ABNORMAL HIGH (ref 70–99)
Potassium: 4.3 mmol/L (ref 3.5–5.1)
Sodium: 127 mmol/L — ABNORMAL LOW (ref 135–145)

## 2020-12-08 MED ORDER — SUCRALFATE 1 G PO TABS
1.0000 g | ORAL_TABLET | Freq: Two times a day (BID) | ORAL | Status: DC
  Administered 2020-12-08 – 2020-12-09 (×4): 1 g via ORAL
  Filled 2020-12-08 (×4): qty 1

## 2020-12-08 MED ORDER — GLYCERIN (LAXATIVE) 2.1 G RE SUPP
1.0000 | Freq: Every day | RECTAL | Status: DC | PRN
  Filled 2020-12-08: qty 1

## 2020-12-08 MED ORDER — BISACODYL 10 MG RE SUPP
10.0000 mg | Freq: Every day | RECTAL | Status: DC | PRN
  Administered 2020-12-08: 10 mg via RECTAL
  Filled 2020-12-08: qty 1

## 2020-12-08 MED ORDER — OXYCODONE HCL 5 MG PO TABS
5.0000 mg | ORAL_TABLET | ORAL | Status: DC | PRN
  Administered 2020-12-08: 5 mg via ORAL
  Filled 2020-12-08: qty 1

## 2020-12-08 MED ORDER — HYDROMORPHONE HCL 1 MG/ML IJ SOLN
1.0000 mg | INTRAMUSCULAR | Status: DC | PRN
  Administered 2020-12-08 – 2020-12-10 (×6): 1 mg via INTRAVENOUS
  Filled 2020-12-08 (×6): qty 1

## 2020-12-08 NOTE — Progress Notes (Addendum)
[facility]  PROGRESS NOTE  Glenn Gonzalez ZOX:[id number] DOB: Apr 21, 1961 DOA: 12/18/2020 PCP: Lajean Manes, MD Admit date - 12/07/2020   Admitting Physician Desiree Hane, MD  Outpatient Primary MD for the patient is Lajean Manes, MD  LOS - 1 Brief Narrative   Glenn Gonzalez is a 60 y.o. year old male with medical history significant for Cardiac transplant on tacrolimus and prednisone, chronic combined systolic/diastolic CHF, GERD, high-grade T1 bladder cancer with recent hospitalization from 81/19-1/4 for AKI complicated by hydronephrosis in the setting of new retroperitoneal lymphadenopathy patient underwent stent placement by urology and was treated for postoperative multifocal pneumonia.  He presented from urology office with reports of increased back pain not well controlled on oral pain medication as well as generalized fatigue, poor appetite was brought in under observation due to concern for back pain in the setting of known retroperitoneal lymphadenopathy.  Hospital course complicated by acute hyponatremia in the setting of poor appetite requiring IV fluids and persistent leukocytosis.  CT abdomen imaging consistent with retroperitoneal adenopathy and dilation of right renal pelvis and collecting systems concerning for urothelial neoplasm with extensive metastatic process.  Subjective  Feels better pain control on IV dilaudid changed overnight. Still having acid reflux  A & P    Retroperitoneal adenopathy and perinephric nodularity with ongoing back pain.  Still having quite a bit of pain in his back.  Patient is neurologically intact with no urinary continence, no bowel incontinence.  CT imaging concerning for upper tract urothelial neoplasm, in setting of immunosuppression differential also includes posttransplant lymphoproliferative disorder/follow-up, patient has prior history of Hodgkin's lymphoma.  Status post IR guided retroperitoneal biopsy on 1/6 - Follow  biopsy results -continue neurochecks - Changed from IV morphine to IV dilaudid 0.5 mg every 4 hours as needed severe pain (he needed to start low to ensure good tolerance). - Optimize bowel regimen lactulose and senna docusate  Constipation. Stool noted throughout the colon on CT abdomen.  Patient reports no bowel movement for the past week.  Likely related to opioid regimen for pain control. - Senna docusate- lactulose as needed (has taken response in the past)  Acute on chronic hyponatremia, slowly improving.   Does look dry on exam like related to diminished oral intake.  Slowly improving with IV fluids but still has quite diminished oral intake in the setting of ongoing reflux suspect multifactorial etiology including GERD, constipation, ascites, retroperitoneal lymphadenopathy.TSH, cortisol wnl.Nadir of 119 -Continue monitor BMP -continue normal saline  Diminished oral intake.  Likely complicated by retroperitoneal lymphadenopathy as well as some noted ascites though small volume on CT abdomen.  Suspect further complicated by poorly controlled reflux symptoms that are likely exacerbating in setting of his acute illness - Monitor on regular diet Diazepam as needed-continue IV fluids we will monitor ability to tolerate oral intake  GERD.  Last EGD 08/2020 within normal limits.  Reports increase reflux symptoms but no overt nausea or vomiting; however unable to tolerate p.o. intake.  Suspect exacerbated in the setting of retroperitoneal lymphadenopathy, small volume ascites, and ongoing constipation - Continue home supplied PPI (Aciphex) -We will discuss with pharmacology adding sucralfate  Leukocytosis, stable.  After recent discharge  WBC was 19.7, remains essentially the same.  Remains afebrile. -Daily CBC - Blood cultures if becomes febrile  Prior multifocal pneumonia hospitalization.  On room air.  Has no cough. -Complete cefdinir course from previous hospitalization, end date  1/10  Chronic combined systolic and diastolic CHF.  Prior EF of  15% in 2016 last EF on 10/2020 preserved with 60 to 65%.  No signs of volume overload on exam.  Actually looks a bit dry.-We will continue IV fluid for hyponatremia in the setting of diminished p.o. intake with close monitoring of volume status - Daily weights - Monitor ins and outs  Gout, stable -continue allopurinol  History of cardiac transplant.  Denies any chest pain. -Continue home supplied tacrolimus, prednisone  CKD stage II.   Most recent baseline creatinine 1.8 from last hospitalization.  Currently creatinine 2.06 Monitor output -avoid nephrotoxins.  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 Irene Limbo also followed by urology as outpatient, appreciate their assistance  Hypothyroidism, TSH stable -continue Synthroid        Family Communication  : None  Code Status : Full  Disposition Plan  :  Patient is from home. Anticipated d/c date: 2 to 3 days. Barriers to d/c or necessity for inpatient status:  Requires IV fluids for hyponatremia monitoring to improve ability to tolerate p.o. due to severe reflux, IV pain control  Persistent pain for retroperitoneal lymphadenopathy Consults  :  IR, oncology  Procedures  : IR guided biopsy on 1/6  DVT Prophylaxis  :  SCDS  MDM: The below labs and imaging reports were reviewed and summarized above.  Medication management as above.  Lab Results  Component Value Date   PLT 446 (H) 12/08/2020    Diet :  Diet Order            DIET SOFT Room service appropriate? Yes; Fluid consistency: Thin  Diet effective now                  Inpatient Medications Scheduled Meds: . allopurinol  200 mg Oral Daily  . azelastine  1 spray Each Nare Daily   And  . fluticasone  1 spray Each Nare Daily  . cefdinir  300 mg Oral BID  . docusate sodium  100 mg Oral BID  . famotidine  20 mg Oral BID  . levothyroxine   125 mcg Oral Daily  . loratadine  10 mg Oral Daily  . pantoprazole  40 mg Oral BID  . predniSONE  5 mg Oral Daily  . senna-docusate  2 tablet Oral BID  . sucralfate  1 g Oral BID AC & HS  . Tacrolimus ER  1.5 mg Oral Daily  . traZODone  100 mg Oral QHS   Continuous Infusions: . sodium chloride 100 mL/hr at 12/07/20 2040   PRN Meds:.acetaminophen **OR** acetaminophen, calcium carbonate, HYDROmorphone (DILAUDID) injection, lactulose, magnesium hydroxide, ondansetron **OR** ondansetron (ZOFRAN) IV  Antibiotics  :   Anti-infectives (From admission, onward)   Start     Dose/Rate Route Frequency Ordered Stop   12/19/2020 2200  cefdinir (OMNICEF) capsule 300 mg        300 mg Oral 2 times daily 12/06/2020 1716 12/10/20 2159       Objective   Vitals:   12/07/20 0613 12/07/20 1207 12/07/20 2030 12/08/20 0518  BP: (!) 149/86 (!) 145/84 120/79 127/69  Pulse: 97 95 97 95  Resp: 16 14 18 18   Temp: 99.3 F (37.4 C) 98.4 F (36.9 C) 98.9 F (37.2 C) 99 F (37.2 C)  TempSrc: Oral Oral Oral Oral  SpO2: 94% 98% 96% 93%  Weight:      Height:        SpO2: 93 % O2 Flow Rate (L/min): 2 L/min  Wt  Readings from Last 3 Encounters:  12/04/2020 102.1 kg  11/29/20 100.2 kg  08/31/20 101.2 kg     Intake/Output Summary (Last 24 hours) at 12/08/2020 1501 Last data filed at 12/07/2020 1900 Gross per 24 hour  Intake --  Output 300 ml  Net -300 ml    Physical Exam:  Awake Alert, Oriented X 3, Normal affect Dry oral mucosa Able to move all extremities without any focal deficits appreciated [place].AT, Normal respiratory effort on room air, CTAB RRR,No Gallops,Rubs or new Murmurs,  +ve B.Sounds, Abd Soft and distended, No tenderness, No rebound, guarding or rigidity. No Cyanosis, No new Rash or bruise    I have personally reviewed the following:   Data Reviewed:  CBC Recent Labs  Lab 12/02/20 0522 12/03/20 0520 12/24/2020 1923 12/06/20 0519 12/06/20 1756 12/07/20 0503 12/08/20 0511   WBC 19.8* 19.7* 19.7* 18.1* 18.4* 17.1*  17.2* 19.3*  HGB 12.9* 14.4 14.1 13.5 13.6 13.9  13.7 13.1  HCT 40.2 42.2 42.0 39.5 40.4 40.8  40.6 39.0  PLT 409* 460* 418* 459* 446* 438*  440* 446*  MCV 97.8 95.3 94.0 91.9 94.2 94.2  94.2 95.8  MCH 31.4 32.5 31.5 31.4 31.7 32.1  31.8 32.2  MCHC 32.1 34.1 33.6 34.2 33.7 34.1  33.7 33.6  RDW 16.0* 15.4 14.7 14.8 14.9 15.0  15.1 15.4  LYMPHSABS 0.6* 0.8 0.8  --   --   --   --   MONOABS 2.0* 1.8* 2.0*  --   --   --   --   EOSABS 0.1 0.1 0.0  --   --   --   --   BASOSABS 0.1 0.1 0.1  --   --   --   --     Chemistries  Recent Labs  Lab 12/02/20 0522 12/03/20 0520 12/12/2020 1923 12/06/20 0519 12/06/20 1020 12/07/20 0831 12/07/20 1310 12/08/20 0511  NA 132* 130* 119* 121* 124* 126* 126* 127*  K 3.8 3.7 4.2 3.7 4.0 4.0 3.8 4.3  CL 97* 93* 82* 85* 88* 92* 91* 95*  CO2 23 21* 25 22 23 22  21* 22  GLUCOSE 102* 105* 106* 81 93 90 93 104*  BUN 24* 22* 32* 30* 32* 31* 34* 34*  CREATININE 1.97* 1.87* 1.81* 1.87* 2.01* 2.06* 2.06* 2.07*  CALCIUM 8.6* 9.3 9.2 8.6* 8.8* 8.4* 8.3* 8.5*  MG 1.7 1.5* 1.7  --   --   --   --   --   AST  --   --  22 21  --   --   --   --   ALT  --   --  12 11  --   --   --   --   ALKPHOS  --   --  58 54  --   --   --   --   BILITOT  --   --  0.7 0.9  --   --   --   --    ------------------------------------------------------------------------------------------------------------------ No results for input(s): CHOL, HDL, LDLCALC, TRIG, CHOLHDL, LDLDIRECT in the last 72 hours.  No results found for: HGBA1C ------------------------------------------------------------------------------------------------------------------ Recent Labs    12/06/20 0519  TSH 3.931   ------------------------------------------------------------------------------------------------------------------ No results for input(s): VITAMINB12, FOLATE, FERRITIN, TIBC, IRON, RETICCTPCT in the last 72 hours.  Coagulation profile No results for  input(s): INR, PROTIME in the last 168 hours.  No results for input(s): DDIMER in the last 72 hours.  Cardiac Enzymes No results for input(s): CKMB,  TROPONINI, MYOGLOBIN in the last 168 hours.  Invalid input(s): CK ------------------------------------------------------------------------------------------------------------------    Component Value Date/Time   BNP 121.1 (H) 11/30/2020 0031    Micro Results Recent Results (from the past 240 hour(s))  SARS Coronavirus 2 by RT PCR (hospital order, performed in [facility] hospital lab) Nasopharyngeal Nasopharyngeal Swab     Status: None   Collection Time: 11/29/20  2:45 PM   Specimen: Nasopharyngeal Swab  Result Value Ref Range Status   SARS Coronavirus 2 NEGATIVE NEGATIVE Final    Comment: (NOTE) SARS-CoV-2 target nucleic acids are NOT DETECTED.  The SARS-CoV-2 RNA is generally detectable in upper and lower respiratory specimens during the acute phase of infection. The lowest concentration of SARS-CoV-2 viral copies this assay can detect is 250 copies / mL. A negative result does not preclude SARS-CoV-2 infection and should not be used as the sole basis for treatment or other patient management decisions.  A negative result may occur with improper specimen collection / handling, submission of specimen other than nasopharyngeal swab, presence of viral mutation(s) within the areas targeted by this assay, and inadequate number of viral copies (<250 copies / mL). A negative result must be combined with clinical observations, patient history, and epidemiological information.  Fact Sheet for Patients:   StrictlyIdeas.no  Fact Sheet for Healthcare Providers: BankingDealers.co.za  This test is not yet approved or  cleared by the [place] FDA and has been authorized for detection and/or diagnosis of SARS-CoV-2 by FDA under an Emergency Use Authorization (EUA).  This EUA will remain in  effect (meaning this test can be used) for the duration of the COVID-19 declaration under Section 564(b)(1) of the Act, 21 U.S.C. section 360bbb-3(b)(1), unless the authorization is terminated or revoked sooner.  Performed at [facility], [place] [address], [place], [place] [address]   Urine Culture     Status: None   Collection Time: 11/29/20  6:35 PM   Specimen: PATH Other; Tissue  Result Value Ref Range Status   Specimen Description   Final    URINE, RANDOM Performed at [facility] [address], [place], [place] [address]    Special Requests   Final    NONE Performed at [facility] [place] [address], [place], [place] [address]    Culture   Final    NO GROWTH Performed at [facility], [place] [address], [place], [place] [address]    Report Status 12/10/2020 FINAL  Final  Culture, blood (x 2)     Status: None   Collection Time: 11/30/20 12:31 AM   Specimen: BLOOD  Result Value Ref Range Status   Specimen Description   Final    BLOOD RIGHT HAND Performed at [facility] [address], [place], [place] [address]    Special Requests   Final    BOTTLES DRAWN AEROBIC ONLY Blood Culture adequate volume Performed at [place] [place] [address], [place], [place] [address]    Culture   Final    NO GROWTH 5 DAYS Performed at [facility], [place] [address], [place], [place] [address]    Report Status 12/24/2020 FINAL  Final  Culture, blood (x 2)     Status: None   Collection Time: 11/30/20 12:31 AM   Specimen: BLOOD  Result Value Ref Range Status   Specimen Description   Final    BLOOD LEFT HAND Performed at [facility]  [address] [place], [place] [address]    Special Requests   Final    BOTTLES DRAWN AEROBIC ONLY Blood Culture adequate volume Performed at [facility] [address], [place], [place] [address]    Culture    Final    NO GROWTH 5 DAYS Performed at [facility], [place] [address], [place], [place] [address]    Report Status 12/20/2020 FINAL  Final  Resp Panel by RT-PCR (Flu A&B, Covid) Nasopharyngeal Swab     Status: None   Collection Time: 11/30/20  4:30 AM   Specimen: Nasopharyngeal Swab; Nasopharyngeal(NP) swabs in vial transport medium  Result Value Ref Range Status   SARS Coronavirus 2 by RT PCR NEGATIVE NEGATIVE Final    Comment: (NOTE) SARS-CoV-2 target nucleic acids are NOT DETECTED.  The SARS-CoV-2 RNA is generally detectable in upper respiratory specimens during the acute phase of infection. The lowest concentration of SARS-CoV-2 viral copies this assay can detect is 138 copies/mL. A negative result does not preclude SARS-Cov-2 infection and should not be used as the sole basis for treatment or other patient management decisions. A negative result may occur with  improper specimen collection/handling, submission of specimen other than nasopharyngeal swab, presence of viral mutation(s) within the areas targeted by this assay, and inadequate number of viral copies(<138 copies/mL). A negative result must be combined with clinical observations, patient history, and epidemiological information. The expected result is Negative.  Fact Sheet for Patients:  EntrepreneurPulse.com.au  Fact Sheet for Healthcare Providers:  IncredibleEmployment.be  This test is no t yet approved or cleared by the [place] FDA and  has been authorized for detection and/or diagnosis of SARS-CoV-2 by FDA under an Emergency Use Authorization (EUA). This EUA will remain  in effect (meaning this test can be used) for the duration of the COVID-19 declaration under Section 564(b)(1) of the Act, 21 U.S.C.section 360bbb-3(b)(1), unless the authorization is terminated  or revoked sooner.       Influenza A by PCR NEGATIVE NEGATIVE Final   Influenza B by PCR NEGATIVE  NEGATIVE Final    Comment: (NOTE) The Xpert Xpress SARS-CoV-2/FLU/RSV plus assay is intended as an aid in the diagnosis of influenza from Nasopharyngeal swab specimens and should not be used as a sole basis for treatment. Nasal washings and aspirates are unacceptable for Xpert Xpress SARS-CoV-2/FLU/RSV testing.  Fact Sheet for Patients: EntrepreneurPulse.com.au  Fact Sheet for Healthcare Providers: IncredibleEmployment.be  This test is not yet approved or cleared by the [place] FDA and has been authorized for detection and/or diagnosis of SARS-CoV-2 by FDA under an Emergency Use Authorization (EUA). This EUA will remain in effect (meaning this test can be used) for the duration of the COVID-19 declaration under Section 564(b)(1) of the Act, 21 U.S.C. section 360bbb-3(b)(1), unless the authorization is terminated or revoked.  Performed at [facility], [place] [address] [place], [place] [address]   MRSA PCR Screening     Status: None   Collection Time: 11/30/20 10:22 AM   Specimen: Nasopharyngeal  Result Value Ref Range Status   MRSA by PCR NEGATIVE NEGATIVE Final    Comment:        The GeneXpert MRSA Assay (FDA approved for NASAL specimens only), is one component of a comprehensive MRSA colonization surveillance program. It is not intended to diagnose MRSA infection nor to guide or monitor treatment for MRSA infections. Performed at [facility], [place] [address] [place],  [address]   SARS CORONAVIRUS 2 (  TAT 6-24 HRS) Nasopharyngeal Nasopharyngeal Swab     Status: None   Collection Time: 12/14/2020  1:48 PM   Specimen: Nasopharyngeal Swab  Result Value Ref Range Status   SARS Coronavirus 2 NEGATIVE NEGATIVE Final    Comment: (NOTE) SARS-CoV-2 target nucleic acids are NOT DETECTED.  The SARS-CoV-2 RNA is generally detectable in upper and lower respiratory specimens during the acute phase  of infection. Negative results do not preclude SARS-CoV-2 infection, do not rule out co-infections with other pathogens, and should not be used as the sole basis for treatment or other patient management decisions. Negative results must be combined with clinical observations, patient history, and epidemiological information. The expected result is Negative.  Fact Sheet for Patients: SugarRoll.be  Fact Sheet for Healthcare Providers: https://www.woods-mathews.com/  This test is not yet approved or cleared by the [place] FDA and  has been authorized for detection and/or diagnosis of SARS-CoV-2 by FDA under an Emergency Use Authorization (EUA). This EUA will remain  in effect (meaning this test can be used) for the duration of the COVID-19 declaration under Se ction 564(b)(1) of the Act, 21 U.S.C. section 360bbb-3(b)(1), unless the authorization is terminated or revoked sooner.  Performed at [facility], [place] [address] [place], [place] [address]     Radiology Reports CT ABDOMEN PELVIS WO CONTRAST  Result Date: 12/20/2020 CLINICAL DATA:  Cancer of unknown primary in this 60 year old male. EXAM: CT CHEST, ABDOMEN AND PELVIS WITHOUT CONTRAST TECHNIQUE: Multidetector CT imaging of the chest, abdomen and pelvis was performed following the standard protocol without IV contrast. COMPARISON:  CT abdomen and pelvis of the same date. Also with CT of December 30th of 2021 FINDINGS: CT CHEST FINDINGS Cardiovascular: Calcified atheromatous plaque in the thoracic aorta. Heart size is normal following sternotomy for heart transplant by report a [facility] medical center. Three-vessel coronary artery calcification. No pericardial effusion. Calcifications in the LEFT atrium partially seen on previous imaging are of uncertain significance central pulmonary vasculature also with some signs of calcification. Mediastinum/Nodes: Ovoid structure  associated with LEFT thyroid 11 mm similar to previous imaging, cervical spine from 2017. Thyroid as described above. Calcifications in the LEFT supraclavicular region may be within venous structures on the LEFT. No axillary lymphadenopathy. Surgical clips in the RIGHT axilla and soft tissue thickening over the LEFT pectoralis musculature similar grossly compared to remote MRI evaluation 2018 and surgical clips seen on numerous priors in the RIGHT axilla. No mediastinal lymphadenopathy. No gross hilar lymphadenopathy. Lungs/Pleura: Small RIGHT of pleural effusion and basilar airspace disease. Small nodule in the superior aspect of the RIGHT middle lobe approximately 3 mm on image 57 of series 5. Biapical scarring. Small nodule in the LEFT upper lobe 6 mm (image 51, series 5) airways are patent. Musculoskeletal: Evidence ir sternotomy without significant bony bridging but with sclerotic margins along the sternotomy line similar to studies dating back to August of 2021 with respect to visualized portions on prior abdomen studies no destructive bone finding or acute bone process. CT ABDOMEN PELVIS FINDINGS Hepatobiliary: 1.9 cm lesion in the RIGHT hepatic lobe near the dome of the RIGHT hemi liver does not display attenuation values of simple cyst is unchanged from the study acquired on the same date at another facility. Additionally a 1.4 cm lesion in the RIGHT hepatic lobe on image 23 of series 2 is unchanged. Sludge in the gallbladder. Tiny low-density foci in the LEFT hepatic lobe without change 1 on image 19 of series 2 in  the lateral segment measuring 5 mm and another in the medial segment of the LEFT hepatic lobe measuring 12 mm on image 21 of series 2 these areas appears similar dating back to August of 2020, lesions in the RIGHT hepatic lobe are new. Pancreas: Normal pancreatic contour without signs of inflammation. Spleen: Post splenectomy. Adrenals/Urinary Tract: Adrenal glands, normal on the LEFT and  obscured on the RIGHT by perinephric and Peri adrenal nodularity. Fullness of the RIGHT renal pelvis with nephroureteral stent in place showing soft tissue density with distension of the renal pelvis and calices similar to the study acquired on the same date. Nephrolithiasis also unchanged. Perinephric nodularity along the medial upper pole the LEFT kidney measures 6.4 x 2.7 cm which is within 1-2 mm of its previous measurement when measured by this observer on the prior study. Increased in size when compared to the study of November 30, 2020. Renal cortical scarring on the LEFT with nephrolithiasis in the lower pole unchanged from very recent comparison. Urinary bladder with small amount of gas in the urinary bladder with distal aspect of the stent in place, loop coiled in the urinary bladder. Stomach/Bowel: Scattered colonic diverticulosis without acute gastrointestinal process. Normal appendix. Stool and contrast throughout the colon. Vascular/Lymphatic: Calcified atheromatous plaque in the abdominal aorta without aneurysmal dilation. Retroperitoneal adenopathy unchanged from scan earlier the same day, largest lymph nodes behind the inferior vena cava and in the intra-aortocaval groove and in the upper abdomen, largest on image 41 of series 2 measuring 1.9 cm. Smaller lymph nodes along the LEFT periaortic chain. Postoperative changes in the retroperitoneum associated with previous lymphadenectomy No pelvic lymphadenopathy. Reproductive: Prostate with uro lift device deployment bilaterally similar to recent priors. Other: Extensive retroperitoneal fascial thickening/stranding extending into the root of the small bowel mesentery, nodular changes associated with this thickening on the RIGHT outlining the renal fascia this crosses the midline where it is without significant nodularity but with fascial thickening and stranding, also tracking into the pelvis. Small amount of fluid in the pelvis. Musculoskeletal: Spinal  degenerative changes. No destructive bone process or acute bone finding. IMPRESSION: 1. Retroperitoneal adenopathy and perinephric nodularity with dilation of the RIGHT renal pelvis and collecting systems, filled with what is suspected to represent neoplasm. 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 Differential considerations given heart transplantation and presumed immunosuppression would include posttransplant lymphoproliferative disorder/lymphoma. 2. Dense material in collecting systems may represent a mixture of tumor and or blood and there is profound dilation of collecting systems of the RIGHT kidney. This is unchanged compared to the recent comparison study. 3. Signs of hepatic involvement with new lesions in the RIGHT hepatic lobe. 4. Ascites with small amount of ascites with very subtle infiltration of fat in the omentum not mentioned above, for instance on image 50 of series 2 raising the question of peritoneal involvement as well. This area is in the range of 2-3 mm. 5. Small RIGHT pleural effusion and basilar airspace disease. 6. Postoperative changes about the chest likely related to prior heart transplantation with surgical clips in the RIGHT axilla and thickening and calcification along the anterior pectoralis musculature not changed since 2018. 7. Calcification in the LEFT atrium also about the aortic root and pulmonary artery and to a lesser degree in the RIGHT atrium likely reflects changes of vascular anastomotic sites in the setting of heart transplantation. 8. 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 9. Post splenectomy. 10. Aortic atherosclerosis. 11. Small nodule in the LEFT upper lobe 6 mm airways are patent. These results will be called to the ordering clinician or representative by the Radiologist Assistant, and  communication documented in the PACS or [employer]. Aortic Atherosclerosis (ICD10-I70.0). Electronically Signed   By: Zetta Bills M.D.   On: 12/03/2020 17:38   DG Chest 1 View  Result Date: 11/29/2020 CLINICAL DATA:  Hypoxia EXAM: CHEST  1 VIEW COMPARISON:  08/03/2020 FINDINGS: Since the prior examination, there has developed right basilar consolidation, possibly infectious in the appropriate clinical setting. Mild focal infiltrate is also noted within the right apex. Left lung is clear. No pneumothorax or pleural effusion. Median sternotomy has been performed. Cardiac size within normal limits. Multiple healed right rib fractures are noted. Multiple surgical clips are seen within the right axilla. IMPRESSION: Interval development of multifocal pulmonary infiltrate within the right lung, more focal within the right lung base, suspicious for atypical infection in the acute setting. Electronically Signed   By: Fidela Salisbury MD   On: 11/29/2020 22:27   DG Chest 2 View  Result Date: 12/11/2020 CLINICAL DATA:  Bladder cancer.  Follow-up multifocal pneumonia. EXAM: CHEST - 2 VIEW COMPARISON:  11/29/2020 FINDINGS: Prior median sternotomy. Heart is normal size. Patchy right lung airspace disease in the right apex and right lower lung. Minimal left base linear atelectasis or scarring. Overall aeration in the right lung slightly improved. No effusions. IMPRESSION: Patchy right lung airspace disease with slight improvement since prior study. Left base atelectasis or scarring. Electronically Signed   By: Rolm Baptise M.D.   On: 12/26/2020 13:25   CT CHEST WO CONTRAST  Result Date: 12/21/2020 CLINICAL DATA:  Cancer of unknown primary in this 60 year old male. EXAM: CT CHEST, ABDOMEN AND PELVIS WITHOUT CONTRAST TECHNIQUE: Multidetector CT imaging of the chest, abdomen and pelvis was performed following the standard protocol without IV contrast. COMPARISON:  CT abdomen and pelvis of the same date. Also with CT  of December 30th of 2021 FINDINGS: CT CHEST FINDINGS Cardiovascular: Calcified atheromatous plaque in the thoracic aorta. Heart size is normal following sternotomy for heart transplant by report a [facility]. Three-vessel coronary artery calcification. No pericardial effusion. Calcifications in the LEFT atrium partially seen on previous imaging are of uncertain significance central pulmonary vasculature also with some signs of calcification. Mediastinum/Nodes: Ovoid structure associated with LEFT thyroid 11 mm similar to previous imaging, cervical spine from 2017. Thyroid as described above. Calcifications in the LEFT supraclavicular region may be within venous structures on the LEFT. No axillary lymphadenopathy. Surgical clips in the RIGHT axilla and soft tissue thickening over the LEFT pectoralis musculature similar grossly compared to remote MRI evaluation 2018 and surgical clips seen on numerous priors in the RIGHT axilla. No mediastinal lymphadenopathy. No gross hilar lymphadenopathy. Lungs/Pleura: Small RIGHT of pleural effusion and basilar airspace disease. Small nodule in the superior aspect of the RIGHT middle lobe approximately 3 mm on image 57 of series 5. Biapical scarring. Small nodule in the LEFT upper lobe 6 mm (image 51, series 5) airways are patent. Musculoskeletal: Evidence ir sternotomy without significant bony bridging but with sclerotic margins along the sternotomy line similar to studies dating back to August of 2021 with respect to visualized portions on prior abdomen studies no destructive bone finding or acute bone process. CT ABDOMEN PELVIS FINDINGS Hepatobiliary: 1.9 cm lesion in the RIGHT hepatic lobe near the dome of the  RIGHT hemi liver does not display attenuation values of simple cyst is unchanged from the study acquired on the same date at another facility. Additionally a 1.4 cm lesion in the RIGHT hepatic lobe on image 23 of series 2 is unchanged. Sludge in the  gallbladder. Tiny low-density foci in the LEFT hepatic lobe without change 1 on image 19 of series 2 in the lateral segment measuring 5 mm and another in the medial segment of the LEFT hepatic lobe measuring 12 mm on image 21 of series 2 these areas appears similar dating back to August of 2020, lesions in the RIGHT hepatic lobe are new. Pancreas: Normal pancreatic contour without signs of inflammation. Spleen: Post splenectomy. Adrenals/Urinary Tract: Adrenal glands, normal on the LEFT and obscured on the RIGHT by perinephric and Peri adrenal nodularity. Fullness of the RIGHT renal pelvis with nephroureteral stent in place showing soft tissue density with distension of the renal pelvis and calices similar to the study acquired on the same date. Nephrolithiasis also unchanged. Perinephric nodularity along the medial upper pole the LEFT kidney measures 6.4 x 2.7 cm which is within 1-2 mm of its previous measurement when measured by this observer on the prior study. Increased in size when compared to the study of November 30, 2020. Renal cortical scarring on the LEFT with nephrolithiasis in the lower pole unchanged from very recent comparison. Urinary bladder with small amount of gas in the urinary bladder with distal aspect of the stent in place, loop coiled in the urinary bladder. Stomach/Bowel: Scattered colonic diverticulosis without acute gastrointestinal process. Normal appendix. Stool and contrast throughout the colon. Vascular/Lymphatic: Calcified atheromatous plaque in the abdominal aorta without aneurysmal dilation. Retroperitoneal adenopathy unchanged from scan earlier the same day, largest lymph nodes behind the inferior vena cava and in the intra-aortocaval groove and in the upper abdomen, largest on image 41 of series 2 measuring 1.9 cm. Smaller lymph nodes along the LEFT periaortic chain. Postoperative changes in the retroperitoneum associated with previous lymphadenectomy No pelvic lymphadenopathy.  Reproductive: Prostate with uro lift device deployment bilaterally similar to recent priors. Other: Extensive retroperitoneal fascial thickening/stranding extending into the root of the small bowel mesentery, nodular changes associated with this thickening on the RIGHT outlining the renal fascia this crosses the midline where it is without significant nodularity but with fascial thickening and stranding, also tracking into the pelvis. Small amount of fluid in the pelvis. Musculoskeletal: Spinal degenerative changes. No destructive bone process or acute bone finding. IMPRESSION: 1. Retroperitoneal adenopathy and perinephric nodularity with dilation of the RIGHT renal pelvis and collecting systems, filled with what is suspected to represent neoplasm. 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 Differential considerations given heart transplantation and presumed immunosuppression would include posttransplant lymphoproliferative disorder/lymphoma. 2. Dense material in collecting systems may represent a mixture of tumor and or blood and there is profound dilation of collecting systems of the RIGHT kidney. This is unchanged compared to the recent comparison study. 3. Signs of hepatic involvement with new lesions in the RIGHT hepatic lobe. 4. Ascites with small amount of ascites with very subtle infiltration of fat in the omentum not mentioned above, for instance on image 50 of series 2 raising the question of peritoneal involvement as well. This area is in the range of 2-3 mm. 5. Small RIGHT pleural effusion and basilar airspace disease. 6. Postoperative changes about the chest likely related to prior heart transplantation with surgical clips in the  RIGHT axilla and thickening and calcification along the anterior pectoralis musculature not changed since 2018. 7. Calcification in the LEFT atrium also about the aortic root and  pulmonary artery and to a lesser degree in the RIGHT atrium likely reflects changes of vascular anastomotic sites in the setting of heart transplantation. 8. 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 9. Post splenectomy. 10. Aortic atherosclerosis. 11. Small nodule in the LEFT upper lobe 6 mm airways are patent. These results will be called to the ordering clinician or representative by the Radiologist Assistant, and communication documented in the PACS or [employer]. Aortic Atherosclerosis (ICD10-I70.0). Electronically Signed   By: Zetta Bills M.D.   On: 12/19/2020 17:38   NM Pulmonary Perfusion  Result Date: 12/29/2020 CLINICAL DATA:  Respiratory failure EXAM: NUCLEAR MEDICINE PERFUSION LUNG SCAN TECHNIQUE: Perfusion images were obtained in multiple projections after intravenous injection of radiopharmaceutical. Ventilation scans intentionally deferred if perfusion scan and chest x-ray adequate for interpretation during COVID 19 epidemic. RADIOPHARMACEUTICALS:  4.1 mCi Tc-55m MAA IV COMPARISON:  Chest x-ray 12/02/2020 FINDINGS: Slightly heterogeneous distribution of radiotracer within the lung fields. Multiple small bilateral wedge-shaped perfusion defects. No corresponding radiographic abnormality. No large mismatched segmental perfusion defect. IMPRESSION: Intermediate probability for pulmonary embolism. Electronically Signed   By: Davina Poke D.O.   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 ANESTHESIA/SEDATION: Fentanyl 100 mcg IV; Versed 4 mg IV Sedation time: 14 minutes; The patient was continuously monitored during the procedure by the interventional radiology nurse under my direct  supervision. CONTRAST:  None. COMPLICATIONS: SIR Level A - No therapy, no consequence. Retroperitoneal hemorrhage at site of biopsy, controlled with Gel-Foam slurry administration along needle track. PROCEDURE: Informed consent was obtained from the patient following an explanation of the procedure, risks, benefits and alternatives. A time out was performed prior to the initiation of the procedure. The patient was positioned in a partial left lateral decubitus position on the CT table and a limited CT was performed for procedural planning demonstrating similar appearing multifocal right retroperitoneal lymphadenopathy. The procedure was planned. The operative site was prepped and draped in the usual sterile fashion. Appropriate trajectory was confirmed with a 22 gauge spinal needle after the adjacent tissues were anesthetized with 1% Lidocaine with epinephrine. Under intermittent CT guidance, a 17 gauge coaxial needle was advanced into the peripheral aspect of the mass. Appropriate positioning was confirmed and a total of 4 samples were obtained with an 18 gauge core needle biopsy device. A limited CT demonstrated focal hemorrhage about the biopsied lymph node. Gel-Foam slurry was injected through the introducer needle along the needle track. The co-axial needle was removed and hemostasis was achieved with manual compression. Additional limited postprocedural CT was negative for expanding hemorrhage or additional complication. A dressing was placed. The patient tolerated the procedure well without immediate postprocedural complication. IMPRESSION: Technically successful CT guided core needle biopsy of right retroperitoneal lymph node. Ruthann Cancer, MD [facility] Electronically Signed   By: Ruthann Cancer MD   On: 12/07/2020 08:53   DG C-Arm 1-60 Min-No Report  Result Date: 11/29/2020 Fluoroscopy was utilized by the requesting physician.  No radiographic  interpretation.   ECHOCARDIOGRAM COMPLETE  Result Date: 11/30/2020    ECHOCARDIOGRAM REPORT  Patient Name:   KHALIK PEWITT Bussey Date of Exam: 11/30/2020 Medical Rec #:  [id number]         Height:       74.0 in Accession #:    [id number]        Weight:       221.0 lb Date of Birth:  05-18-1961        BSA:          2.268 m Patient Age:    81 years          BP:           103/59 mmHg Patient Gender: M                 HR:           94 bpm. Exam Location:  Inpatient Procedure: 2D Echo, Cardiac Doppler, Color Doppler and Intracardiac            Opacification Agent Indications:    Dyspnea R06.00  History:        Patient has prior history of Echocardiogram examinations, most                 recent 05/12/2017. Arrythmias:Atrial Fibrillation; Risk                 Factors:Non-Smoker. GERD. 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  Sonographer:    Vickie Epley RDCS Referring Phys: [id number] [facility]  Sonographer Comments: Suboptimal apical window and suboptimal subcostal window. Pt unable to turn on side due to herniated discs. IMPRESSIONS  1. Left ventricular ejection fraction, by estimation, is 60 to 65%. The left ventricle has normal function. The left ventricle has no regional wall motion abnormalities. Left ventricular diastolic parameters were normal.  2. Right ventricular systolic function is normal. The right ventricular size is normal. There is normal pulmonary artery systolic pressure. The estimated right ventricular systolic pressure is 27.7 mmHg.  3. The mitral valve is normal in structure. No evidence of mitral valve regurgitation. No evidence of mitral stenosis.  4. The aortic valve is normal in structure. Aortic valve regurgitation is not visualized. No aortic stenosis is present.  5. The inferior vena cava is normal in size with greater than 50% respiratory variability, suggesting right atrial pressure of 3 mmHg. FINDINGS  Left Ventricle: Left ventricular ejection fraction, by estimation, is 60 to 65%. The left  ventricle has normal function. The left ventricle has no regional wall motion abnormalities. Definity contrast agent was given IV to delineate the left ventricular  endocardial borders. The left ventricular internal cavity size was normal in size. There is no left ventricular hypertrophy. Left ventricular diastolic parameters were normal. Normal left ventricular filling pressure. Right Ventricle: The right ventricular size is normal. No increase in right ventricular wall thickness. Right ventricular systolic function is normal. There is normal pulmonary artery systolic pressure. The tricuspid regurgitant velocity is 2.39 m/s, and  with an assumed right atrial pressure of 3 mmHg, the estimated right ventricular systolic pressure is 41.2 mmHg. Left Atrium: Left atrial size was normal in size. Right Atrium: Right atrial size was normal in size. Pericardium: There is no evidence of pericardial effusion. Mitral Valve: The mitral valve is normal in structure. No evidence of mitral valve regurgitation. No evidence of mitral valve stenosis. Tricuspid Valve: The tricuspid valve is normal in structure. Tricuspid valve regurgitation is mild . No evidence of tricuspid stenosis. Aortic Valve: The aortic valve is normal in structure. Aortic valve regurgitation is not visualized.  No aortic stenosis is present. Pulmonic Valve: The pulmonic valve was normal in structure. Pulmonic valve regurgitation is not visualized. No evidence of pulmonic stenosis. Aorta: The aortic root is normal in size and structure. Venous: The inferior vena cava is normal in size with greater than 50% respiratory variability, suggesting right atrial pressure of 3 mmHg. IAS/Shunts: No atrial level shunt detected by color flow Doppler.  LEFT VENTRICLE PLAX 2D LVIDd:         4.40 cm     Diastology LVIDs:         3.20 cm     LV e' medial:    10.00 cm/s LV PW:         0.90 cm     LV E/e' medial:  7.5 LV IVS:        0.90 cm     LV e' lateral:   11.30 cm/s LVOT  diam:     2.50 cm     LV E/e' lateral: 6.7 LV SV:         48 LV SV Index:   21 LVOT Area:     4.91 cm  LV Volumes (MOD) LV vol d, MOD A4C: 80.2 ml LV vol s, MOD A4C: 30.4 ml LV SV MOD A4C:     80.2 ml LEFT ATRIUM           Index LA diam:      2.70 cm 1.19 cm/m LA Vol (A4C): 16.3 ml 7.19 ml/m  AORTIC VALVE LVOT Vmax:   50.20 cm/s LVOT Vmean:  36.700 cm/s LVOT VTI:    0.099 m  AORTA Ao Root diam: 3.40 cm MITRAL VALVE               TRICUSPID VALVE MV Area (PHT): 5.38 cm    TR Peak grad:   22.8 mmHg MV Decel Time: 141 msec    TR Vmax:        239.00 cm/s MV E velocity: 75.40 cm/s MV A velocity: 36.00 cm/s  SHUNTS MV E/A ratio:  2.09        Systemic VTI:  0.10 m                            Systemic Diam: 2.50 cm Dani Gobble Croitoru MD Electronically signed by Sanda Klein MD Signature Date/Time: 11/30/2020/12:30:40 PM    Final    VAS [place] LOWER EXTREMITY VENOUS (DVT)  Result Date: 12/03/2020  Lower Venous DVT Study Indications: Pain.  Comparison Study: Previous 12/2019 Performing Technologist: Vonzell Schlatter RVT  Examination Guidelines: A complete evaluation includes B-mode imaging, spectral Doppler, color Doppler, and power Doppler as needed of all accessible portions of each vessel. Bilateral testing is considered an integral part of a complete examination. Limited examinations for reoccurring indications may be performed as noted. The reflux portion of the exam is performed with the patient in reverse Trendelenburg.  +---------+---------------+---------+-----------+----------+--------------+ RIGHT    CompressibilityPhasicitySpontaneityPropertiesThrombus Aging +---------+---------------+---------+-----------+----------+--------------+ CFV      Full           Yes      Yes                                 +---------+---------------+---------+-----------+----------+--------------+ SFJ      Full                                                         +---------+---------------+---------+-----------+----------+--------------+  FV Prox  Full                                                        +---------+---------------+---------+-----------+----------+--------------+ FV Mid   Full                                                        +---------+---------------+---------+-----------+----------+--------------+ FV DistalFull                                                        +---------+---------------+---------+-----------+----------+--------------+ PFV      Full                                                        +---------+---------------+---------+-----------+----------+--------------+ POP      Full           Yes      Yes                                 +---------+---------------+---------+-----------+----------+--------------+ PTV      Full                                                        +---------+---------------+---------+-----------+----------+--------------+ PERO     Full                                                        +---------+---------------+---------+-----------+----------+--------------+   +---------+---------------+---------+-----------+----------+--------------+ LEFT     CompressibilityPhasicitySpontaneityPropertiesThrombus Aging +---------+---------------+---------+-----------+----------+--------------+ CFV      Full           Yes      Yes                                 +---------+---------------+---------+-----------+----------+--------------+ SFJ      Full                                                        +---------+---------------+---------+-----------+----------+--------------+ FV Prox  Full                                                        +---------+---------------+---------+-----------+----------+--------------+  FV Mid   Full                                                         +---------+---------------+---------+-----------+----------+--------------+ FV DistalFull                                                        +---------+---------------+---------+-----------+----------+--------------+ PFV      Full                                                        +---------+---------------+---------+-----------+----------+--------------+ POP      Full           Yes      Yes                                 +---------+---------------+---------+-----------+----------+--------------+ PTV      Full                                                        +---------+---------------+---------+-----------+----------+--------------+ PERO     Full                                                        +---------+---------------+---------+-----------+----------+--------------+     Summary: RIGHT: - There is no evidence of deep vein thrombosis in the lower extremity.  - No cystic structure found in the popliteal fossa.  LEFT: - There is no evidence of deep vein thrombosis in the lower extremity.  - No cystic structure found in the popliteal fossa.  *See table(s) above for measurements and observations. Electronically signed by Ruta Hinds MD on 12/03/2020 at 12:15:41 PM.    Final      Time Spent in minutes  30     Desiree Hane M.D on 12/08/2020 at 3:01 PM  To page go to [email or web address] - password [email or web address]

## 2020-12-09 DIAGNOSIS — Z7189 Other specified counseling: Secondary | ICD-10-CM

## 2020-12-09 DIAGNOSIS — E871 Hypo-osmolality and hyponatremia: Secondary | ICD-10-CM | POA: Diagnosis not present

## 2020-12-09 DIAGNOSIS — M545 Low back pain, unspecified: Secondary | ICD-10-CM | POA: Diagnosis not present

## 2020-12-09 DIAGNOSIS — C801 Malignant (primary) neoplasm, unspecified: Secondary | ICD-10-CM | POA: Diagnosis not present

## 2020-12-09 DIAGNOSIS — R590 Localized enlarged lymph nodes: Secondary | ICD-10-CM

## 2020-12-09 DIAGNOSIS — N179 Acute kidney failure, unspecified: Secondary | ICD-10-CM | POA: Diagnosis not present

## 2020-12-09 DIAGNOSIS — K769 Liver disease, unspecified: Secondary | ICD-10-CM

## 2020-12-09 DIAGNOSIS — N189 Chronic kidney disease, unspecified: Secondary | ICD-10-CM | POA: Diagnosis not present

## 2020-12-09 LAB — BASIC METABOLIC PANEL
Anion gap: 9 (ref 5–15)
Anion gap: 11 (ref 5–15)
BUN: 34 mg/dL — ABNORMAL HIGH (ref 6–20)
BUN: 36 mg/dL — ABNORMAL HIGH (ref 6–20)
CO2: 21 mmol/L — ABNORMAL LOW (ref 22–32)
CO2: 21 mmol/L — ABNORMAL LOW (ref 22–32)
Calcium: 8.7 mg/dL — ABNORMAL LOW (ref 8.9–10.3)
Calcium: 9.2 mg/dL (ref 8.9–10.3)
Chloride: 93 mmol/L — ABNORMAL LOW (ref 98–111)
Chloride: 95 mmol/L — ABNORMAL LOW (ref 98–111)
Creatinine, Ser: 2.1 mg/dL — ABNORMAL HIGH (ref 0.61–1.24)
Creatinine, Ser: 2.18 mg/dL — ABNORMAL HIGH (ref 0.61–1.24)
GFR, Estimated: 36 mL/min — ABNORMAL LOW (ref >60)
GFR, Estimated: 34 mL/min — ABNORMAL LOW (ref >60)
Glucose, Bld: 101 mg/dL — ABNORMAL HIGH (ref 70–99)
Glucose, Bld: 107 mg/dL — ABNORMAL HIGH (ref 70–99)
Potassium: 4.2 mmol/L (ref 3.5–5.1)
Potassium: 4.6 mmol/L (ref 3.5–5.1)
Sodium: 123 mmol/L — ABNORMAL LOW (ref 135–145)
Sodium: 127 mmol/L — ABNORMAL LOW (ref 135–145)

## 2020-12-09 LAB — NA AND K (SODIUM & POTASSIUM), RAND UR
Potassium Urine: 40 mmol/L
Sodium, Ur: 45 mmol/L

## 2020-12-09 LAB — CBC
HCT: 38.6 % — ABNORMAL LOW (ref 39.0–52.0)
Hemoglobin: 12.9 g/dL — ABNORMAL LOW (ref 13.0–17.0)
MCH: 32.1 pg (ref 26.0–34.0)
MCHC: 33.4 g/dL (ref 30.0–36.0)
MCV: 96 fL (ref 80.0–100.0)
Platelets: 463 10*3/uL — ABNORMAL HIGH (ref 150–400)
RBC: 4.02 MIL/uL — ABNORMAL LOW (ref 4.22–5.81)
RDW: 15.6 % — ABNORMAL HIGH (ref 11.5–15.5)
WBC: 17.7 10*3/uL — ABNORMAL HIGH (ref 4.0–10.5)
nRBC: 0 % (ref 0.0–0.2)

## 2020-12-09 LAB — OSMOLALITY, URINE: Osmolality, Ur: 637 mOsm/kg (ref 300–900)

## 2020-12-09 LAB — ALBUMIN: Albumin: 3 g/dL — ABNORMAL LOW (ref 3.5–5.0)

## 2020-12-09 LAB — OSMOLALITY: Osmolality: 279 mOsm/kg (ref 275–295)

## 2020-12-09 MED ORDER — OXYCODONE HCL 5 MG PO TABS
10.0000 mg | ORAL_TABLET | ORAL | Status: DC | PRN
  Administered 2020-12-09 – 2020-12-10 (×2): 10 mg via ORAL
  Filled 2020-12-09 (×2): qty 2

## 2020-12-09 MED ORDER — OXYCODONE HCL ER 10 MG PO T12A
10.0000 mg | EXTENDED_RELEASE_TABLET | Freq: Two times a day (BID) | ORAL | Status: DC
Start: 2020-12-09 — End: 2020-12-12
  Administered 2020-12-09 – 2020-12-11 (×6): 10 mg via ORAL
  Filled 2020-12-09 (×7): qty 1

## 2020-12-09 MED ORDER — METOCLOPRAMIDE HCL 5 MG PO TABS
5.0000 mg | ORAL_TABLET | Freq: Three times a day (TID) | ORAL | Status: DC
Start: 2020-12-09 — End: 2021-01-02
  Administered 2020-12-09 – 2021-01-01 (×77): 5 mg via ORAL
  Filled 2020-12-09 (×79): qty 1

## 2020-12-09 MED ORDER — OXYCODONE HCL 5 MG PO TABS: 10.0000 mg | ORAL_TABLET | Freq: Three times a day (TID) | ORAL | Status: DC

## 2020-12-09 MED ORDER — SODIUM CHLORIDE 0.9 % IV SOLN: INTRAVENOUS | Status: AC

## 2020-12-09 MED ORDER — SODIUM CHLORIDE 0.9 % IV SOLN: INTRAVENOUS | Status: DC

## 2020-12-09 NOTE — Progress Notes (Addendum)
[facility]  PROGRESS NOTE  Glenn Gonzalez ZSW:[id number] DOB: Jul 18, 1961 DOA: 12/04/2020 PCP: Lajean Manes, MD Admit date - 12/28/2020   Admitting Physician Desiree Hane, MD  Outpatient Primary MD for the patient is Lajean Manes, MD  LOS - 2 Brief Narrative   Glenn Gonzalez is a 60 y.o. year old male with medical history significant for Cardiac transplant on tacrolimus and prednisone, chronic combined systolic/diastolic CHF, GERD, high-grade T1 bladder cancer with recent hospitalization from 32/20-2/5 for AKI complicated by hydronephrosis in the setting of new retroperitoneal lymphadenopathy patient underwent stent placement by urology and was treated for postoperative multifocal pneumonia.  He presented from urology office with reports of increased back pain not well controlled on oral pain medication as well as generalized fatigue, poor appetite was brought in under observation due to concern for back pain in the setting of known retroperitoneal lymphadenopathy.  Hospital course complicated by acute hyponatremia in the setting of poor appetite requiring IV fluids and persistent leukocytosis.  CT abdomen imaging consistent with retroperitoneal adenopathy and dilation of right renal pelvis and collecting systems concerning for urothelial neoplasm with extensive metastatic process.  Subjective  Still feels like his pain is poorly controlled despite increase  A & P    Retroperitoneal adenopathy and perinephric nodularity with ongoing back pain.  Still having quite a bit of pain in his back.  Patient is neurologically intact with no urinary continence, no bowel incontinence.  CT imaging concerning for upper tract urothelial neoplasm, in setting of immunosuppression differential also includes posttransplant lymphoproliferative disorder/follow-up, patient has prior history of Hodgkin's lymphoma.  Status post IR guided retroperitoneal biopsy on 1/6 - Follow biopsy  results -continue neurochecks - Appreciate palliative medicine assistance with pain control: OxyContin 10 mg twice daily, oxycodone 10 mg every 4 hours as needed moderate pain, IV dilaudid 1 mg every 4 hours as needed severe pain (he needed to start low to ensure good tolerance). - Optimize bowel regimen lactulose and senna docusate  Constipation, improving Had bowel movement this a.m. yesterday Stool noted throughout the colon on CT abdomen.  Patient reports no bowel movement for the past week.  Likely related to opioid regimen for pain control.  - Senna docusate scheduled -lactulose as needed (has taken response in the past)  Acute on chronic hyponatremia, slowly improving.   Slowly improving with IV fluids, feels more up to increasing oral intake after BMs.  Had dropped this a.m. 123 but improved to 127 with continued IV fluids and oral intake. in the setting of ongoing reflux suspect multifactorial etiology including GERD, constipation, ascites, retroperitoneal lymphadenopathy.TSH, cortisol wnl.Nadir of 119 -Continue monitor BMP -continue normal saline - Reglan scheduled for hiccups - Continue to encourage oral intake  Diminished oral intake.  Likely complicated by retroperitoneal lymphadenopathy as well as some noted ascites though small volume on CT abdomen.  Suspect further complicated by poorly controlled reflux symptoms that are likely exacerbating in setting of his acute illness - Monitor on regular diet -continue IV fluids we will monitor ability to tolerate oral intake  GERD.  Last EGD 08/2020 within normal limits.  Reports increase reflux symptoms but no overt nausea or vomiting; however unable to tolerate p.o. intake.  Suspect exacerbated in the setting of retroperitoneal lymphadenopathy, small volume ascites, and ongoing constipation - Continue home supplied PPI (Aciphex) -Added sucralfate twice daily - Scheduled Reglan for hiccups  Leukocytosis, stable.  After recent  discharge  WBC was 19.7, remains essentially the same.  Remains afebrile. -  Daily CBC - Blood cultures if becomes febrile  Prior multifocal pneumonia hospitalization.  On room air.  Has no cough. -Complete cefdinir course from previous hospitalization, end date 1/10  Chronic combined systolic and diastolic CHF.  Prior EF of 15% in 2016 last EF on 10/2020 preserved with 60 to 65%.  Seems to have a little bit more lower extremity edema comparison to prior examinations.  No shortness of breath. --We will continue IV fluid for hyponatremia in the setting of diminished p.o. intake with close monitoring of volume status, anticipate being able to discontinue later next 24 hours - Daily weights - Monitor ins and outs  Gout, stable -continue allopurinol  History of cardiac transplant.  Denies any chest pain. -Continue home supplied tacrolimus, prednisone  AKI CKD stage II.   Most recent baseline creatinine 1.8 from last hospitalization.  Currently creatinine 2.18 Monitor output. Suspect prerenal from decreased oral intake -avoid nephrotoxins.  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 Irene Limbo also followed by urology as outpatient, appreciate their assistance  Hypothyroidism, TSH stable -continue Synthroid        Family Communication  : None  Code Status : Full  Disposition Plan  :  Patient is from home. Anticipated d/c date: 2 to 3 days. Barriers to d/c or necessity for inpatient status:  Requires IV fluids for hyponatremia monitoring to improve ability to tolerate p.o. due to severe reflux, IV pain control  Persistent pain for retroperitoneal lymphadenopathy Consults  :  IR, oncology, palliative medicine  Procedures  : IR guided biopsy on 1/6  DVT Prophylaxis  :  SCDS  MDM: The below labs and imaging reports were reviewed and summarized above.  Medication management as above.  Lab Results  Component Value Date    PLT 463 (H) 12/09/2020    Diet :  Diet Order            DIET SOFT Room service appropriate? Yes; Fluid consistency: Thin  Diet effective now                  Inpatient Medications Scheduled Meds: . allopurinol  200 mg Oral Daily  . azelastine  1 spray Each Nare Daily   And  . fluticasone  1 spray Each Nare Daily  . cefdinir  300 mg Oral BID  . docusate sodium  100 mg Oral BID  . levothyroxine  125 mcg Oral Daily  . loratadine  10 mg Oral Daily  . metoCLOPramide  5 mg Oral TID AC & HS  . oxyCODONE  10 mg Oral Q12H  . pantoprazole  40 mg Oral BID  . predniSONE  5 mg Oral Daily  . senna-docusate  2 tablet Oral BID  . sucralfate  1 g Oral BID AC & HS  . Tacrolimus ER  1.5 mg Oral Daily  . traZODone  100 mg Oral QHS   Continuous Infusions: . sodium chloride 100 mL/hr at 12/07/20 2040   PRN Meds:.acetaminophen **OR** acetaminophen, calcium carbonate, Glycerin (Adult), HYDROmorphone (DILAUDID) injection, lactulose, magnesium hydroxide, ondansetron **OR** ondansetron (ZOFRAN) IV, oxyCODONE  Antibiotics  :   Anti-infectives (From admission, onward)   Start     Dose/Rate Route Frequency Ordered Stop   12/22/2020 2200  cefdinir (OMNICEF) capsule 300 mg        300 mg Oral 2 times daily 12/13/2020 1716 12/10/20 2159       Objective   Vitals:   12/08/20 2029 12/09/20 0451  12/09/20 1148 12/09/20 1557  BP: (!) 135/95 125/80  116/73  Pulse: 90 87  96  Resp: 18 16  (!) 23  Temp: 98.6 F (37 C) 97.9 F (36.6 C)  98.8 F (37.1 C)  TempSrc: Oral Oral  Oral  SpO2: 97% 93%  94%  Weight:   105.8 kg   Height:        SpO2: 94 % O2 Flow Rate (L/min): 2 L/min  Wt Readings from Last 3 Encounters:  12/09/20 105.8 kg  11/29/20 100.2 kg  08/31/20 101.2 kg     Intake/Output Summary (Last 24 hours) at 12/09/2020 1713 Last data filed at 12/08/2020 2300 Gross per 24 hour  Intake 1700 ml  Output 600 ml  Net 1100 ml    Physical Exam:  Awake Alert, Oriented X 3, Normal  affect Normal oral mucosa Able to move all extremities without any focal deficits appreciated [place].AT, 1+ pitting edema of bilateral lower extremities mainly at ankles but does seem to be in thighs as well Normal respiratory effort on room air, CTAB RRR,No Gallops,Rubs or new Murmurs,  +ve B.Sounds, Abd Soft and distended, No tenderness, No rebound, guarding or rigidity. No Cyanosis, No new Rash or bruise    I have personally reviewed the following:   Data Reviewed:  CBC Recent Labs  Lab 12/03/20 0520 12/10/2020 1923 12/06/20 0519 12/06/20 1756 12/07/20 0503 12/08/20 0511 12/09/20 0845  WBC 19.7* 19.7* 18.1* 18.4* 17.1*  17.2* 19.3* 17.7*  HGB 14.4 14.1 13.5 13.6 13.9  13.7 13.1 12.9*  HCT 42.2 42.0 39.5 40.4 40.8  40.6 39.0 38.6*  PLT 460* 418* 459* 446* 438*  440* 446* 463*  MCV 95.3 94.0 91.9 94.2 94.2  94.2 95.8 96.0  MCH 32.5 31.5 31.4 31.7 32.1  31.8 32.2 32.1  MCHC 34.1 33.6 34.2 33.7 34.1  33.7 33.6 33.4  RDW 15.4 14.7 14.8 14.9 15.0  15.1 15.4 15.6*  LYMPHSABS 0.8 0.8  --   --   --   --   --   MONOABS 1.8* 2.0*  --   --   --   --   --   EOSABS 0.1 0.0  --   --   --   --   --   BASOSABS 0.1 0.1  --   --   --   --   --     Chemistries  Recent Labs  Lab 12/03/20 0520 12/25/2020 1923 12/06/20 0519 12/06/20 1020 12/07/20 0831 12/07/20 1310 12/08/20 0511 12/09/20 0845 12/09/20 1334  NA 130* 119* 121*   < > 126* 126* 127* 123* 127*  K 3.7 4.2 3.7   < > 4.0 3.8 4.3 4.2 4.6  CL 93* 82* 85*   < > 92* 91* 95* 93* 95*  CO2 21* 25 22   < > 22 21* 22 21* 21*  GLUCOSE 105* 106* 81   < > 90 93 104* 101* 107*  BUN 22* 32* 30*   < > 31* 34* 34* 34* 36*  CREATININE 1.87* 1.81* 1.87*   < > 2.06* 2.06* 2.07* 2.10* 2.18*  CALCIUM 9.3 9.2 8.6*   < > 8.4* 8.3* 8.5* 8.7* 9.2  MG 1.5* 1.7  --   --   --   --   --   --   --   AST  --  22 21  --   --   --   --   --   --   ALT  --  12 11  --   --   --   --   --   --   ALKPHOS  --  58 54  --   --   --   --   --   --    BILITOT  --  0.7 0.9  --   --   --   --   --   --    < > = values in this interval not displayed.   ------------------------------------------------------------------------------------------------------------------ No results for input(s): CHOL, HDL, LDLCALC, TRIG, CHOLHDL, LDLDIRECT in the last 72 hours.  No results found for: HGBA1C ------------------------------------------------------------------------------------------------------------------ No results for input(s): TSH, T4TOTAL, T3FREE, THYROIDAB in the last 72 hours.  Invalid input(s): FREET3 ------------------------------------------------------------------------------------------------------------------ No results for input(s): VITAMINB12, FOLATE, FERRITIN, TIBC, IRON, RETICCTPCT in the last 72 hours.  Coagulation profile No results for input(s): INR, PROTIME in the last 168 hours.  No results for input(s): DDIMER in the last 72 hours.  Cardiac Enzymes No results for input(s): CKMB, TROPONINI, MYOGLOBIN in the last 168 hours.  Invalid input(s): CK ------------------------------------------------------------------------------------------------------------------    Component Value Date/Time   BNP 121.1 (H) 11/30/2020 0031    Micro Results Recent Results (from the past 240 hour(s))  Urine Culture     Status: None   Collection Time: 11/29/20  6:35 PM   Specimen: PATH Other; Tissue  Result Value Ref Range Status   Specimen Description   Final    URINE, RANDOM Performed at [facility] [address] [place], [place] [address]    Special Requests   Final    NONE Performed at [facility], [place] [address], [place], [place] [address]    Culture   Final    NO GROWTH Performed at [facility], [place] [address] [place], [place] [address]    Report Status 12/23/2020 FINAL  Final  Culture, blood (x 2)     Status: None   Collection Time: 11/30/20 12:31 AM   Specimen: BLOOD   Result Value Ref Range Status   Specimen Description   Final    BLOOD RIGHT HAND Performed at [facility] [address] [place], [place] [address]    Special Requests   Final    BOTTLES DRAWN AEROBIC ONLY Blood Culture adequate volume Performed at [facility] [address], [place], [place] [address]    Culture   Final    NO GROWTH 5 DAYS Performed at [facility], [place] [address], [place], [place] [address]    Report Status 12/08/2020 FINAL  Final  Culture, blood (x 2)     Status: None   Collection Time: 11/30/20 12:31 AM   Specimen: BLOOD  Result Value Ref Range Status   Specimen Description   Final    BLOOD LEFT HAND Performed at [facility] [address] [place] [address], [place], [place] [address]    Special Requests   Final    BOTTLES DRAWN AEROBIC ONLY Blood Culture adequate volume Performed at [facility] [address], [place], [place] [address]    Culture   Final    NO GROWTH 5 DAYS Performed at [facility], [place] [address], [place], [place] [address]    Report Status 12/05/2020 FINAL  Final  Resp Panel by RT-PCR (Flu A&B, Covid) Nasopharyngeal Swab     Status: None   Collection Time: 11/30/20  4:30 AM   Specimen: Nasopharyngeal Swab; Nasopharyngeal(NP) swabs in  vial transport medium  Result Value Ref Range Status   SARS Coronavirus 2 by RT PCR NEGATIVE NEGATIVE Final    Comment: (NOTE) SARS-CoV-2 target nucleic acids are NOT DETECTED.  The SARS-CoV-2 RNA is generally detectable in upper respiratory specimens during the acute phase of infection. The lowest concentration of SARS-CoV-2 viral copies this assay can detect is 138 copies/mL. A negative result does not preclude SARS-Cov-2 infection and should not be used as the sole basis for treatment or other patient management decisions. A negative result may occur with  improper specimen collection/handling, submission of specimen  other than nasopharyngeal swab, presence of viral mutation(s) within the areas targeted by this assay, and inadequate number of viral copies(<138 copies/mL). A negative result must be combined with clinical observations, patient history, and epidemiological information. The expected result is Negative.  Fact Sheet for Patients:  EntrepreneurPulse.com.au  Fact Sheet for Healthcare Providers:  IncredibleEmployment.be  This test is no t yet approved or cleared by the [place] FDA and  has been authorized for detection and/or diagnosis of SARS-CoV-2 by FDA under an Emergency Use Authorization (EUA). This EUA will remain  in effect (meaning this test can be used) for the duration of the COVID-19 declaration under Section 564(b)(1) of the Act, 21 U.S.C.section 360bbb-3(b)(1), unless the authorization is terminated  or revoked sooner.       Influenza A by PCR NEGATIVE NEGATIVE Final   Influenza B by PCR NEGATIVE NEGATIVE Final    Comment: (NOTE) The Xpert Xpress SARS-CoV-2/FLU/RSV plus assay is intended as an aid in the diagnosis of influenza from Nasopharyngeal swab specimens and should not be used as a sole basis for treatment. Nasal washings and aspirates are unacceptable for Xpert Xpress SARS-CoV-2/FLU/RSV testing.  Fact Sheet for Patients: EntrepreneurPulse.com.au  Fact Sheet for Healthcare Providers: IncredibleEmployment.be  This test is not yet approved or cleared by the [place] FDA and has been authorized for detection and/or diagnosis of SARS-CoV-2 by FDA under an Emergency Use Authorization (EUA). This EUA will remain in effect (meaning this test can be used) for the duration of the COVID-19 declaration under Section 564(b)(1) of the Act, 21 U.S.C. section 360bbb-3(b)(1), unless the authorization is terminated or revoked.  Performed at [facility], [place] [address] [place], [place] [address]   MRSA PCR Screening     Status: None   Collection Time: 11/30/20 10:22 AM   Specimen: Nasopharyngeal  Result Value Ref Range Status   MRSA by PCR NEGATIVE NEGATIVE Final    Comment:        The GeneXpert MRSA Assay (FDA approved for NASAL specimens only), is one component of a comprehensive MRSA colonization surveillance program. It is not intended to diagnose MRSA infection nor to guide or monitor treatment for MRSA infections. Performed at [facility], [place] [address], [place], [place] [address]   SARS CORONAVIRUS 2 (TAT 6-24 HRS) Nasopharyngeal Nasopharyngeal Swab     Status: None   Collection Time: 12/03/2020  1:48 PM   Specimen: Nasopharyngeal Swab  Result Value Ref Range Status   SARS Coronavirus 2 NEGATIVE NEGATIVE Final    Comment: (NOTE) SARS-CoV-2 target nucleic acids are NOT DETECTED.  The SARS-CoV-2 RNA is generally detectable in upper and lower respiratory specimens during the acute phase of infection. Negative results do not preclude SARS-CoV-2 infection, do not rule out co-infections with other pathogens, and should not be used as the sole basis for treatment or other patient management decisions. Negative results must  be combined with clinical observations, patient history, and epidemiological information. The expected result is Negative.  Fact Sheet for Patients: SugarRoll.be  Fact Sheet for Healthcare Providers: https://www.woods-mathews.com/  This test is not yet approved or cleared by the [place] FDA and  has been authorized for detection and/or diagnosis of SARS-CoV-2 by FDA under an Emergency Use Authorization (EUA). This EUA will remain  in effect (meaning this test can be used) for the duration of the COVID-19 declaration under Se ction 564(b)(1) of the Act, 21 U.S.C. section 360bbb-3(b)(1), unless the authorization is terminated or revoked  sooner.  Performed at [facility], [place] [address], [place], [place] [address]     Radiology Reports CT ABDOMEN PELVIS WO CONTRAST  Result Date: 12/16/2020 CLINICAL DATA:  Cancer of unknown primary in this 60 year old male. EXAM: CT CHEST, ABDOMEN AND PELVIS WITHOUT CONTRAST TECHNIQUE: Multidetector CT imaging of the chest, abdomen and pelvis was performed following the standard protocol without IV contrast. COMPARISON:  CT abdomen and pelvis of the same date. Also with CT of December 30th of 2021 FINDINGS: CT CHEST FINDINGS Cardiovascular: Calcified atheromatous plaque in the thoracic aorta. Heart size is normal following sternotomy for heart transplant by report a [facility] medical center. Three-vessel coronary artery calcification. No pericardial effusion. Calcifications in the LEFT atrium partially seen on previous imaging are of uncertain significance central pulmonary vasculature also with some signs of calcification. Mediastinum/Nodes: Ovoid structure associated with LEFT thyroid 11 mm similar to previous imaging, cervical spine from 2017. Thyroid as described above. Calcifications in the LEFT supraclavicular region may be within venous structures on the LEFT. No axillary lymphadenopathy. Surgical clips in the RIGHT axilla and soft tissue thickening over the LEFT pectoralis musculature similar grossly compared to remote MRI evaluation 2018 and surgical clips seen on numerous priors in the RIGHT axilla. No mediastinal lymphadenopathy. No gross hilar lymphadenopathy. Lungs/Pleura: Small RIGHT of pleural effusion and basilar airspace disease. Small nodule in the superior aspect of the RIGHT middle lobe approximately 3 mm on image 57 of series 5. Biapical scarring. Small nodule in the LEFT upper lobe 6 mm (image 51, series 5) airways are patent. Musculoskeletal: Evidence ir sternotomy without significant bony bridging but with sclerotic margins along the sternotomy line similar to studies  dating back to August of 2021 with respect to visualized portions on prior abdomen studies no destructive bone finding or acute bone process. CT ABDOMEN PELVIS FINDINGS Hepatobiliary: 1.9 cm lesion in the RIGHT hepatic lobe near the dome of the RIGHT hemi liver does not display attenuation values of simple cyst is unchanged from the study acquired on the same date at another facility. Additionally a 1.4 cm lesion in the RIGHT hepatic lobe on image 23 of series 2 is unchanged. Sludge in the gallbladder. Tiny low-density foci in the LEFT hepatic lobe without change 1 on image 19 of series 2 in the lateral segment measuring 5 mm and another in the medial segment of the LEFT hepatic lobe measuring 12 mm on image 21 of series 2 these areas appears similar dating back to August of 2020, lesions in the RIGHT hepatic lobe are new. Pancreas: Normal pancreatic contour without signs of inflammation. Spleen: Post splenectomy. Adrenals/Urinary Tract: Adrenal glands, normal on the LEFT and obscured on the RIGHT by perinephric and Peri adrenal nodularity. Fullness of the RIGHT renal pelvis with nephroureteral stent in place showing soft tissue density with distension of the renal pelvis and calices similar to the study acquired on the  same date. Nephrolithiasis also unchanged. Perinephric nodularity along the medial upper pole the LEFT kidney measures 6.4 x 2.7 cm which is within 1-2 mm of its previous measurement when measured by this observer on the prior study. Increased in size when compared to the study of November 30, 2020. Renal cortical scarring on the LEFT with nephrolithiasis in the lower pole unchanged from very recent comparison. Urinary bladder with small amount of gas in the urinary bladder with distal aspect of the stent in place, loop coiled in the urinary bladder. Stomach/Bowel: Scattered colonic diverticulosis without acute gastrointestinal process. Normal appendix. Stool and contrast throughout the colon.  Vascular/Lymphatic: Calcified atheromatous plaque in the abdominal aorta without aneurysmal dilation. Retroperitoneal adenopathy unchanged from scan earlier the same day, largest lymph nodes behind the inferior vena cava and in the intra-aortocaval groove and in the upper abdomen, largest on image 41 of series 2 measuring 1.9 cm. Smaller lymph nodes along the LEFT periaortic chain. Postoperative changes in the retroperitoneum associated with previous lymphadenectomy No pelvic lymphadenopathy. Reproductive: Prostate with uro lift device deployment bilaterally similar to recent priors. Other: Extensive retroperitoneal fascial thickening/stranding extending into the root of the small bowel mesentery, nodular changes associated with this thickening on the RIGHT outlining the renal fascia this crosses the midline where it is without significant nodularity but with fascial thickening and stranding, also tracking into the pelvis. Small amount of fluid in the pelvis. Musculoskeletal: Spinal degenerative changes. No destructive bone process or acute bone finding. IMPRESSION: 1. Retroperitoneal adenopathy and perinephric nodularity with dilation of the RIGHT renal pelvis and collecting systems, filled with what is suspected to represent neoplasm. 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 Differential considerations given heart transplantation and presumed immunosuppression would include posttransplant lymphoproliferative disorder/lymphoma. 2. Dense material in collecting systems may represent a mixture of tumor and or blood and there is profound dilation of collecting systems of the RIGHT kidney. This is unchanged compared to the recent comparison study. 3. Signs of hepatic involvement with new lesions in the RIGHT hepatic lobe. 4. Ascites with small amount of ascites with very subtle infiltration of fat in the omentum not  mentioned above, for instance on image 50 of series 2 raising the question of peritoneal involvement as well. This area is in the range of 2-3 mm. 5. Small RIGHT pleural effusion and basilar airspace disease. 6. Postoperative changes about the chest likely related to prior heart transplantation with surgical clips in the RIGHT axilla and thickening and calcification along the anterior pectoralis musculature not changed since 2018. 7. Calcification in the LEFT atrium also about the aortic root and pulmonary artery and to a lesser degree in the RIGHT atrium likely reflects changes of vascular anastomotic sites in the setting of heart transplantation. 8. 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 9. Post splenectomy. 10. Aortic atherosclerosis. 11. Small nodule in the LEFT upper lobe 6 mm airways are patent. These results will be called to the ordering clinician or representative by the Radiologist Assistant, and communication documented in the PACS or [employer]. Aortic Atherosclerosis (ICD10-I70.0). Electronically Signed   By: Zetta Bills M.D.   On: 12/06/2020 17:38   DG Chest 1 View  Result Date: 11/29/2020 CLINICAL DATA:  Hypoxia EXAM: CHEST  1 VIEW COMPARISON:  08/03/2020 FINDINGS: Since the prior examination, there has developed right basilar consolidation, possibly infectious in the appropriate  clinical setting. Mild focal infiltrate is also noted within the right apex. Left lung is clear. No pneumothorax or pleural effusion. Median sternotomy has been performed. Cardiac size within normal limits. Multiple healed right rib fractures are noted. Multiple surgical clips are seen within the right axilla. IMPRESSION: Interval development of multifocal pulmonary infiltrate within the right lung, more focal within the right lung base, suspicious for atypical infection in the acute setting. Electronically Signed   By: Fidela Salisbury MD   On: 11/29/2020 22:27   DG Chest 2 View  Result Date: 12/18/2020 CLINICAL DATA:  Bladder cancer.  Follow-up multifocal pneumonia. EXAM: CHEST - 2 VIEW COMPARISON:  11/29/2020 FINDINGS: Prior median sternotomy. Heart is normal size. Patchy right lung airspace disease in the right apex and right lower lung. Minimal left base linear atelectasis or scarring. Overall aeration in the right lung slightly improved. No effusions. IMPRESSION: Patchy right lung airspace disease with slight improvement since prior study. Left base atelectasis or scarring. Electronically Signed   By: Rolm Baptise M.D.   On: 12/07/2020 13:25   CT CHEST WO CONTRAST  Result Date: 12/07/2020 CLINICAL DATA:  Cancer of unknown primary in this 60 year old male. EXAM: CT CHEST, ABDOMEN AND PELVIS WITHOUT CONTRAST TECHNIQUE: Multidetector CT imaging of the chest, abdomen and pelvis was performed following the standard protocol without IV contrast. COMPARISON:  CT abdomen and pelvis of the same date. Also with CT of December 30th of 2021 FINDINGS: CT CHEST FINDINGS Cardiovascular: Calcified atheromatous plaque in the thoracic aorta. Heart size is normal following sternotomy for heart transplant by report a [facility] center. Three-vessel coronary artery calcification. No pericardial effusion. Calcifications in the LEFT atrium partially seen on previous imaging are of uncertain significance central pulmonary vasculature also with some signs of calcification. Mediastinum/Nodes: Ovoid structure associated with LEFT thyroid 11 mm similar to previous imaging, cervical spine from 2017. Thyroid as described above. Calcifications in the LEFT supraclavicular region may be within venous structures on the LEFT. No axillary lymphadenopathy. Surgical clips in the RIGHT axilla and soft tissue thickening over the LEFT pectoralis musculature similar grossly compared to remote MRI evaluation 2018 and surgical clips seen on numerous priors in  the RIGHT axilla. No mediastinal lymphadenopathy. No gross hilar lymphadenopathy. Lungs/Pleura: Small RIGHT of pleural effusion and basilar airspace disease. Small nodule in the superior aspect of the RIGHT middle lobe approximately 3 mm on image 57 of series 5. Biapical scarring. Small nodule in the LEFT upper lobe 6 mm (image 51, series 5) airways are patent. Musculoskeletal: Evidence ir sternotomy without significant bony bridging but with sclerotic margins along the sternotomy line similar to studies dating back to August of 2021 with respect to visualized portions on prior abdomen studies no destructive bone finding or acute bone process. CT ABDOMEN PELVIS FINDINGS Hepatobiliary: 1.9 cm lesion in the RIGHT hepatic lobe near the dome of the RIGHT hemi liver does not display attenuation values of simple cyst is unchanged from the study acquired on the same date at another facility. Additionally a 1.4 cm lesion in the RIGHT hepatic lobe on image 23 of series 2 is unchanged. Sludge in the gallbladder. Tiny low-density foci in the LEFT hepatic lobe without change 1 on image 19 of series 2 in the lateral segment measuring 5 mm and another in the medial segment of the LEFT hepatic lobe measuring 12 mm on image 21 of series 2 these areas appears similar dating back to August of 2020, lesions in the RIGHT  hepatic lobe are new. Pancreas: Normal pancreatic contour without signs of inflammation. Spleen: Post splenectomy. Adrenals/Urinary Tract: Adrenal glands, normal on the LEFT and obscured on the RIGHT by perinephric and Peri adrenal nodularity. Fullness of the RIGHT renal pelvis with nephroureteral stent in place showing soft tissue density with distension of the renal pelvis and calices similar to the study acquired on the same date. Nephrolithiasis also unchanged. Perinephric nodularity along the medial upper pole the LEFT kidney measures 6.4 x 2.7 cm which is within 1-2 mm of its previous measurement when measured by  this observer on the prior study. Increased in size when compared to the study of November 30, 2020. Renal cortical scarring on the LEFT with nephrolithiasis in the lower pole unchanged from very recent comparison. Urinary bladder with small amount of gas in the urinary bladder with distal aspect of the stent in place, loop coiled in the urinary bladder. Stomach/Bowel: Scattered colonic diverticulosis without acute gastrointestinal process. Normal appendix. Stool and contrast throughout the colon. Vascular/Lymphatic: Calcified atheromatous plaque in the abdominal aorta without aneurysmal dilation. Retroperitoneal adenopathy unchanged from scan earlier the same day, largest lymph nodes behind the inferior vena cava and in the intra-aortocaval groove and in the upper abdomen, largest on image 41 of series 2 measuring 1.9 cm. Smaller lymph nodes along the LEFT periaortic chain. Postoperative changes in the retroperitoneum associated with previous lymphadenectomy No pelvic lymphadenopathy. Reproductive: Prostate with uro lift device deployment bilaterally similar to recent priors. Other: Extensive retroperitoneal fascial thickening/stranding extending into the root of the small bowel mesentery, nodular changes associated with this thickening on the RIGHT outlining the renal fascia this crosses the midline where it is without significant nodularity but with fascial thickening and stranding, also tracking into the pelvis. Small amount of fluid in the pelvis. Musculoskeletal: Spinal degenerative changes. No destructive bone process or acute bone finding. IMPRESSION: 1. Retroperitoneal adenopathy and perinephric nodularity with dilation of the RIGHT renal pelvis and collecting systems, filled with what is suspected to represent neoplasm. 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 Differential considerations given heart  transplantation and presumed immunosuppression would include posttransplant lymphoproliferative disorder/lymphoma. 2. Dense material in collecting systems may represent a mixture of tumor and or blood and there is profound dilation of collecting systems of the RIGHT kidney. This is unchanged compared to the recent comparison study. 3. Signs of hepatic involvement with new lesions in the RIGHT hepatic lobe. 4. Ascites with small amount of ascites with very subtle infiltration of fat in the omentum not mentioned above, for instance on image 50 of series 2 raising the question of peritoneal involvement as well. This area is in the range of 2-3 mm. 5. Small RIGHT pleural effusion and basilar airspace disease. 6. Postoperative changes about the chest likely related to prior heart transplantation with surgical clips in the RIGHT axilla and thickening and calcification along the anterior pectoralis musculature not changed since 2018. 7. Calcification in the LEFT atrium also about the aortic root and pulmonary artery and to a lesser degree in the RIGHT atrium likely reflects changes of vascular anastomotic sites in the setting of heart transplantation. 8. 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 9. Post splenectomy. 10. Aortic atherosclerosis. 11. Small nodule in the LEFT upper lobe 6 mm airways are patent. These results will be called to the ordering clinician or representative by the Radiologist Assistant,  and communication documented in the PACS or [employer]. Aortic Atherosclerosis (ICD10-I70.0). Electronically Signed   By: Zetta Bills M.D.   On: 12/14/2020 17:38   NM Pulmonary Perfusion  Result Date: 12/05/2020 CLINICAL DATA:  Respiratory failure EXAM: NUCLEAR MEDICINE PERFUSION LUNG SCAN TECHNIQUE: Perfusion images were obtained in multiple projections after intravenous injection of radiopharmaceutical. Ventilation scans  intentionally deferred if perfusion scan and chest x-ray adequate for interpretation during COVID 19 epidemic. RADIOPHARMACEUTICALS:  4.1 mCi Tc-73m MAA IV COMPARISON:  Chest x-ray 12/28/2020 FINDINGS: Slightly heterogeneous distribution of radiotracer within the lung fields. Multiple small bilateral wedge-shaped perfusion defects. No corresponding radiographic abnormality. No large mismatched segmental perfusion defect. IMPRESSION: Intermediate probability for pulmonary embolism. Electronically Signed   By: Davina Poke D.O.   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 ANESTHESIA/SEDATION: Fentanyl 100 mcg IV; Versed 4 mg IV Sedation time: 14 minutes; The patient was continuously monitored during the procedure by the interventional radiology nurse under my direct supervision. CONTRAST:  None. COMPLICATIONS: SIR Level A - No therapy, no consequence. Retroperitoneal hemorrhage at site of biopsy, controlled with Gel-Foam slurry administration along needle track. PROCEDURE: Informed consent was obtained from the patient following an explanation of the procedure, risks, benefits and alternatives. A time out was performed prior to the initiation of the procedure. The patient was positioned in a partial left lateral decubitus position on the CT table and a limited CT was performed for procedural planning demonstrating similar appearing multifocal right retroperitoneal lymphadenopathy. The procedure was planned. The operative site was prepped and draped in the usual sterile fashion. Appropriate trajectory was confirmed with a 22 gauge spinal needle after the adjacent tissues were anesthetized with 1% Lidocaine with epinephrine. Under intermittent CT guidance, a 17 gauge coaxial needle was  advanced into the peripheral aspect of the mass. Appropriate positioning was confirmed and a total of 4 samples were obtained with an 18 gauge core needle biopsy device. A limited CT demonstrated focal hemorrhage about the biopsied lymph node. Gel-Foam slurry was injected through the introducer needle along the needle track. The co-axial needle was removed and hemostasis was achieved with manual compression. Additional limited postprocedural CT was negative for expanding hemorrhage or additional complication. A dressing was placed. The patient tolerated the procedure well without immediate postprocedural complication. IMPRESSION: Technically successful CT guided core needle biopsy of right retroperitoneal lymph node. Ruthann Cancer, MD [facility] Electronically Signed   By: Ruthann Cancer MD   On: 12/07/2020 08:53   DG C-Arm 1-60 Min-No Report  Result Date: 11/29/2020 Fluoroscopy was utilized by the requesting physician.  No radiographic interpretation.   ECHOCARDIOGRAM COMPLETE  Result Date: 11/30/2020    ECHOCARDIOGRAM REPORT   Patient Name:   BERTRAN ZEIMET Asman Date of Exam: 11/30/2020 Medical Rec #:  [id number]         Height:       74.0 in Accession #:    [id number]        Weight:       221.0 lb Date of Birth:  [birth year]        BSA:          2.268 m Patient Age:    76 years          BP:  103/59 mmHg Patient Gender: M                 HR:           94 bpm. Exam Location:  Inpatient Procedure: 2D Echo, Cardiac Doppler, Color Doppler and Intracardiac            Opacification Agent Indications:    Dyspnea R06.00  History:        Patient has prior history of Echocardiogram examinations, most                 recent 05/12/2017. Arrythmias:Atrial Fibrillation; Risk                 Factors:Non-Smoker. GERD. 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  Sonographer:    Vickie Epley RDCS Referring Phys: [id number] [facility]  Sonographer Comments: Suboptimal  apical window and suboptimal subcostal window. Pt unable to turn on side due to herniated discs. IMPRESSIONS  1. Left ventricular ejection fraction, by estimation, is 60 to 65%. The left ventricle has normal function. The left ventricle has no regional wall motion abnormalities. Left ventricular diastolic parameters were normal.  2. Right ventricular systolic function is normal. The right ventricular size is normal. There is normal pulmonary artery systolic pressure. The estimated right ventricular systolic pressure is 39.7 mmHg.  3. The mitral valve is normal in structure. No evidence of mitral valve regurgitation. No evidence of mitral stenosis.  4. The aortic valve is normal in structure. Aortic valve regurgitation is not visualized. No aortic stenosis is present.  5. The inferior vena cava is normal in size with greater than 50% respiratory variability, suggesting right atrial pressure of 3 mmHg. FINDINGS  Left Ventricle: Left ventricular ejection fraction, by estimation, is 60 to 65%. The left ventricle has normal function. The left ventricle has no regional wall motion abnormalities. Definity contrast agent was given IV to delineate the left ventricular  endocardial borders. The left ventricular internal cavity size was normal in size. There is no left ventricular hypertrophy. Left ventricular diastolic parameters were normal. Normal left ventricular filling pressure. Right Ventricle: The right ventricular size is normal. No increase in right ventricular wall thickness. Right ventricular systolic function is normal. There is normal pulmonary artery systolic pressure. The tricuspid regurgitant velocity is 2.39 m/s, and  with an assumed right atrial pressure of 3 mmHg, the estimated right ventricular systolic pressure is 67.3 mmHg. Left Atrium: Left atrial size was normal in size. Right Atrium: Right atrial size was normal in size. Pericardium: There is no evidence of pericardial effusion. Mitral Valve: The  mitral valve is normal in structure. No evidence of mitral valve regurgitation. No evidence of mitral valve stenosis. Tricuspid Valve: The tricuspid valve is normal in structure. Tricuspid valve regurgitation is mild . No evidence of tricuspid stenosis. Aortic Valve: The aortic valve is normal in structure. Aortic valve regurgitation is not visualized. No aortic stenosis is present. Pulmonic Valve: The pulmonic valve was normal in structure. Pulmonic valve regurgitation is not visualized. No evidence of pulmonic stenosis. Aorta: The aortic root is normal in size and structure. Venous: The inferior vena cava is normal in size with greater than 50% respiratory variability, suggesting right atrial pressure of 3 mmHg. IAS/Shunts: No atrial level shunt detected by color flow Doppler.  LEFT VENTRICLE PLAX 2D LVIDd:         4.40 cm     Diastology LVIDs:         3.20 cm     LV e'  medial:    10.00 cm/s LV PW:         0.90 cm     LV E/e' medial:  7.5 LV IVS:        0.90 cm     LV e' lateral:   11.30 cm/s LVOT diam:     2.50 cm     LV E/e' lateral: 6.7 LV SV:         48 LV SV Index:   21 LVOT Area:     4.91 cm  LV Volumes (MOD) LV vol d, MOD A4C: 80.2 ml LV vol s, MOD A4C: 30.4 ml LV SV MOD A4C:     80.2 ml LEFT ATRIUM           Index LA diam:      2.70 cm 1.19 cm/m LA Vol (A4C): 16.3 ml 7.19 ml/m  AORTIC VALVE LVOT Vmax:   50.20 cm/s LVOT Vmean:  36.700 cm/s LVOT VTI:    0.099 m  AORTA Ao Root diam: 3.40 cm MITRAL VALVE               TRICUSPID VALVE MV Area (PHT): 5.38 cm    TR Peak grad:   22.8 mmHg MV Decel Time: 141 msec    TR Vmax:        239.00 cm/s MV E velocity: 75.40 cm/s MV A velocity: 36.00 cm/s  SHUNTS MV E/A ratio:  2.09        Systemic VTI:  0.10 m                            Systemic Diam: 2.50 cm Dani Gobble Croitoru MD Electronically signed by Sanda Klein MD Signature Date/Time: 11/30/2020/12:30:40 PM    Final    VAS [place] LOWER EXTREMITY VENOUS (DVT)  Result Date: 12/03/2020  Lower Venous DVT Study  Indications: Pain.  Comparison Study: Previous 12/2019 Performing Technologist: Vonzell Schlatter RVT  Examination Guidelines: A complete evaluation includes B-mode imaging, spectral Doppler, color Doppler, and power Doppler as needed of all accessible portions of each vessel. Bilateral testing is considered an integral part of a complete examination. Limited examinations for reoccurring indications may be performed as noted. The reflux portion of the exam is performed with the patient in reverse Trendelenburg.  +---------+---------------+---------+-----------+----------+--------------+ RIGHT    CompressibilityPhasicitySpontaneityPropertiesThrombus Aging +---------+---------------+---------+-----------+----------+--------------+ CFV      Full           Yes      Yes                                 +---------+---------------+---------+-----------+----------+--------------+ SFJ      Full                                                        +---------+---------------+---------+-----------+----------+--------------+ FV Prox  Full                                                        +---------+---------------+---------+-----------+----------+--------------+ FV Mid   Full                                                        +---------+---------------+---------+-----------+----------+--------------+  FV DistalFull                                                        +---------+---------------+---------+-----------+----------+--------------+ PFV      Full                                                        +---------+---------------+---------+-----------+----------+--------------+ POP      Full           Yes      Yes                                 +---------+---------------+---------+-----------+----------+--------------+ PTV      Full                                                         +---------+---------------+---------+-----------+----------+--------------+ PERO     Full                                                        +---------+---------------+---------+-----------+----------+--------------+   +---------+---------------+---------+-----------+----------+--------------+ LEFT     CompressibilityPhasicitySpontaneityPropertiesThrombus Aging +---------+---------------+---------+-----------+----------+--------------+ CFV      Full           Yes      Yes                                 +---------+---------------+---------+-----------+----------+--------------+ SFJ      Full                                                        +---------+---------------+---------+-----------+----------+--------------+ FV Prox  Full                                                        +---------+---------------+---------+-----------+----------+--------------+ FV Mid   Full                                                        +---------+---------------+---------+-----------+----------+--------------+ FV DistalFull                                                        +---------+---------------+---------+-----------+----------+--------------+  PFV      Full                                                        +---------+---------------+---------+-----------+----------+--------------+ POP      Full           Yes      Yes                                 +---------+---------------+---------+-----------+----------+--------------+ PTV      Full                                                        +---------+---------------+---------+-----------+----------+--------------+ PERO     Full                                                        +---------+---------------+---------+-----------+----------+--------------+     Summary: RIGHT: - There is no evidence of deep vein thrombosis in the lower extremity.  - No cystic structure found in  the popliteal fossa.  LEFT: - There is no evidence of deep vein thrombosis in the lower extremity.  - No cystic structure found in the popliteal fossa.  *See table(s) above for measurements and observations. Electronically signed by Ruta Hinds MD on 12/03/2020 at 12:15:41 PM.    Final      Time Spent in minutes  30     Desiree Hane M.D on 12/09/2020 at 5:13 PM  To page go to [email or web address] - password [email or web address]

## 2020-12-09 NOTE — Consult Note (Signed)
Palliative Medicine Inpatient Consult Note  Reason for consult:  Pain management  HPI:  Per intake H&P 1.5 by Dr. Marylyn Ishihara, "Glenn Gonzalez is a 60 y.o. male with medical history significant of hydronephrosis, hodgkin's lymphoma, bladder cancer. Presenting with back pain. Recently admitted for hydronephrosis and back pain. At the time, he had a stent placed and imaging found new retroperitoneal lymphadenopathy. He was told to follow up with oncology for repeat scans in a few weeks with possible IR intervention coming after. He reported to the Urology office today with increased back pain. It was sharp and on the right side. It was unrelieved by tramadol or hydrocodone. He was seen in clinic and noted to have his stents in good position. It was recommended that he come to the hospital for further eval.    Review of Systems:  Denies CP, dyspnea. Reports N, poor PO intake, right side back pain. 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 H/o CHF, GERD, AKI, Colon cancer  Clinical Assessment/Goals of Care: I have reviewed medical records including EPIC notes, labs and imaging, received report from bedside RN, assessed the patient.    I met with Glenn Gonzalez to further discuss diagnosis prognosis, GOC, EOL wishes, disposition and options.   I introduced Palliative Medicine as specialized medical care for people living with serious illness. It focuses on providing relief from the symptoms and stress of a serious illness. The goal is to improve quality of life for both the patient and the family.  A detailed discussion was had today regarding advanced directives.  Concepts specific to code status, artifical feeding and hydration, continued IV antibiotics and rehospitalization was had.  The difference between a aggressive medical intervention path  and a palliative comfort care path for this patient at this time was had. Values  and goals of care important to patient and family were attempted to be elicited  Glenn Gonzalez feels an overwhelming sense of responsibility for his wife and family. He says he spent several hours with his daughter crying on the phone yesterday.  He wants treatments as long as they are providing for a life that is purposeful and he is comfortable. He does not like the brain fog from the pain medicine.   He lives in [place] with his wife Lattie Haw who is a Education officer, museum focusing on clients with disabilities. She has MS and he feels that his job is to make her life less stressful. They have twins, one boy and one girl who are 60 years old. He has support from his family and  4 of 5 siblings. He has a sister who is a Designer, fashion/clothing who has advise him in the past. He majored in psychology with a minor in religion. He shared that he does not have any specific religious belief and declined chaplain services.  He shared unfinished business is his 2021 taxes. He has been planning since his heart transplant to have all his legal paperwork in place for his family to be supported.  Symptoms of concern are chronic on acute pain, some insomnia due to the acid reflux, and hiccups. He has had depression and anxiety in the past but states not a current concern. His most significant concern is pain. He has a baseline upper back pain that is 3-4 and right kidney area back pain that is at a 6  but any movement to get up  and walk pain shoots up to 10/10. He says a 4-5 would be tolerable. He states he has a high pain tolerance and has not use opioids for any extended period in the past, maybe for a day after previous surgeries. At home he uses extended release acetaminophen. His pain is significantly more than when he was at home. We developed a plan for pain management with long acting oxycodone ATC with prn oxy IR and dilaudid for severe pain. We will continue to assess if this is working and make adjustments as  needed.  Glenn Gonzalez wishes continued treatment with IVFs, antibiotics. He feels he has met his lifetime max of radiation even if that was a treatment option. In the future he would want a short tem ventilator trial only if it was thought that it  would improve him, chemotherapy if curative, and dialysis if indicated. He would not want to be suffering though. He is clear on his desire to be productive yet comfortable without severe pain.   Discussed the importance of continued conversation with family and their  medical providers regarding overall plan of care and treatment options, ensuring decisions are within the context of the patients values and GOCs.  Glenn Gonzalez is open to palliative care outpatient referral at discharge for continued discussions, management of symptoms and support.  Decision Maker: patient  SUMMARY OF RECOMMENDATIONS    Code Status/Advance Care Planning: FULL CODE  Symptom Management:  Pain acute on chronic: Acetaminophen prn mild pain.  Oxy 10 mg every 12 hour around the clock, prn dose every 4 hours. Hydrommorphone 1 mg every 4 hours for severe pain prn. Hiccups: Metoclopramide 5 mg tid with meals and before bed   Palliative Prophylaxis:   Acid reflux: Tums prn, protonix  Nausea: ondansetron prn  Constipation: colace, senna-docusate, glycerin suppository, lactulose prn  Insomnia: trazadone at bedtime    Psycho-social/Spiritual:   Desire for further Chaplaincy support: no  Additional Recommendations:  Patient and family support as a diagnosis is reached and treatment options discussed.   Prognosis: Glenn Gonzalez has a history of multiple cancers, comorbidities and is immunosuppressed which may limit treatment options.   Discharge Planning: home with palliative medicine outpatient referral  Vitals with BMI 12/09/2020 12/08/2020 12/08/2020  Height - - -  Weight 233 lbs 4 oz - -  BMI 16.10 - -  Systolic [phone]  Diastolic 80 95 87  Pulse 87 90 96      PPS: 60%   This conversation/these recommendations were discussed with patient primary care team via secure chat with Dr. Lonny Prude.  Thank you for the opportunity to participate in the care of this patient and family.   Time In:13:00 Time Out:2:30 Total Time: >70 minutes Greater than 50%  of this time was spent counseling and coordinating care related to the above assessment and plan.  Lindell Spar, NP [facility] Palliative Medicine Team Team Cell Phone: [phone] Please utilize secure chat with additional questions, if there is no response within 30 minutes please call the above phone number  Palliative Medicine Team providers are available by phone from 7am to 7pm daily and can be reached through the team cell phone.  Should this patient require assistance outside of these hours, please call the patient's attending physician.

## 2020-12-09 NOTE — Progress Notes (Signed)
Patient would like his pain to be controlled better and he requesting  a palliative consult.

## 2020-12-10 ENCOUNTER — Inpatient Hospital Stay (HOSPITAL_COMMUNITY): Payer: 59

## 2020-12-10 DIAGNOSIS — C7910 Secondary malignant neoplasm of unspecified urinary organs: Secondary | ICD-10-CM | POA: Diagnosis not present

## 2020-12-10 DIAGNOSIS — I313 Pericardial effusion (noninflammatory): Secondary | ICD-10-CM

## 2020-12-10 DIAGNOSIS — N179 Acute kidney failure, unspecified: Secondary | ICD-10-CM | POA: Diagnosis not present

## 2020-12-10 DIAGNOSIS — I5043 Acute on chronic combined systolic (congestive) and diastolic (congestive) heart failure: Secondary | ICD-10-CM | POA: Diagnosis not present

## 2020-12-10 DIAGNOSIS — I82441 Acute embolism and thrombosis of right tibial vein: Secondary | ICD-10-CM

## 2020-12-10 DIAGNOSIS — R52 Pain, unspecified: Secondary | ICD-10-CM | POA: Diagnosis not present

## 2020-12-10 DIAGNOSIS — I361 Nonrheumatic tricuspid (valve) insufficiency: Secondary | ICD-10-CM | POA: Diagnosis not present

## 2020-12-10 DIAGNOSIS — Z515 Encounter for palliative care: Secondary | ICD-10-CM

## 2020-12-10 DIAGNOSIS — M545 Low back pain, unspecified: Secondary | ICD-10-CM | POA: Diagnosis not present

## 2020-12-10 LAB — COMPREHENSIVE METABOLIC PANEL
ALT: 9 U/L (ref 0–44)
AST: 19 U/L (ref 15–41)
Albumin: 2.6 g/dL — ABNORMAL LOW (ref 3.5–5.0)
Alkaline Phosphatase: 59 U/L (ref 38–126)
Anion gap: 11 (ref 5–15)
BUN: 39 mg/dL — ABNORMAL HIGH (ref 6–20)
CO2: 20 mmol/L — ABNORMAL LOW (ref 22–32)
Calcium: 9.1 mg/dL (ref 8.9–10.3)
Chloride: 95 mmol/L — ABNORMAL LOW (ref 98–111)
Creatinine, Ser: 2.35 mg/dL — ABNORMAL HIGH (ref 0.61–1.24)
GFR, Estimated: 31 mL/min — ABNORMAL LOW (ref >60)
Glucose, Bld: 94 mg/dL (ref 70–99)
Potassium: 4.7 mmol/L (ref 3.5–5.1)
Sodium: 126 mmol/L — ABNORMAL LOW (ref 135–145)
Total Bilirubin: 0.5 mg/dL (ref 0.3–1.2)
Total Protein: 5.2 g/dL — ABNORMAL LOW (ref 6.5–8.1)

## 2020-12-10 LAB — CREATININE, URINE, RANDOM: Creatinine, Urine: 109.31 mg/dL

## 2020-12-10 LAB — D-DIMER, QUANTITATIVE: D-Dimer, Quant: >20 ug/mL-FEU — ABNORMAL HIGH (ref 0.00–0.50)

## 2020-12-10 LAB — ECHOCARDIOGRAM LIMITED
Height: 74 in
S' Lateral: 2.3 cm
Weight: 3686.09 oz

## 2020-12-10 LAB — HEPARIN LEVEL (UNFRACTIONATED): Heparin Unfractionated: 0.12 IU/mL — ABNORMAL LOW (ref 0.30–0.70)

## 2020-12-10 LAB — SURGICAL PATHOLOGY

## 2020-12-10 LAB — SODIUM, URINE, RANDOM: Sodium, Ur: 49 mmol/L

## 2020-12-10 MED ORDER — SUCRALFATE 1 G PO TABS
1.0000 g | ORAL_TABLET | Freq: Two times a day (BID) | ORAL | Status: DC
  Administered 2020-12-10 – 2020-12-13 (×8): 1 g via ORAL
  Filled 2020-12-10 (×9): qty 1

## 2020-12-10 MED ORDER — FUROSEMIDE 10 MG/ML IJ SOLN
60.0000 mg | Freq: Two times a day (BID) | INTRAMUSCULAR | Status: DC
  Administered 2020-12-10 – 2020-12-11 (×2): 60 mg via INTRAVENOUS
  Filled 2020-12-10 (×2): qty 6

## 2020-12-10 MED ORDER — HEPARIN (PORCINE) 25000 UT/250ML-% IV SOLN
1600.0000 [IU]/h | INTRAVENOUS | Status: DC
  Administered 2020-12-10: 1400 [IU]/h via INTRAVENOUS
  Administered 2020-12-11 – 2020-12-13 (×5): 1750 [IU]/h via INTRAVENOUS
  Administered 2020-12-14: 1600 [IU]/h via INTRAVENOUS
  Filled 2020-12-10 (×10): qty 250

## 2020-12-10 NOTE — Progress Notes (Signed)
Pharmacy - IV heparin  Assessment:    Please see note from Dia Sitter, PharmD earlier today for full details.  Briefly, 60 y.o. male on IV heparin for RLE DVT. PMH notable for hematuria while recently on heparin, and now with retroperitoneal hemorrhage following RP node biopsy on 1/6.    Heparin level subtherapeutic at 0.12 units/mL on 1400 units/hr  No bleeding or infusion issues per RN  Hx renal transplant, SCr looks to be rising currently  Plan:   Increase heparin to 1750 units/hr; no bolus  Recheck heparin level in 8 hrs  Daily CBC and heparin level  Reuel Boom, PharmD, BCPS [phone] 12/10/2020, 9:27 PM

## 2020-12-10 NOTE — Progress Notes (Signed)
   12/10/20 1036  Assess: MEWS Score  BP (!) 95/56  Pulse Rate (!) 110  Resp 11  Level of Consciousness Alert  SpO2 94 %  O2 Device Nasal Cannula  O2 Flow Rate (L/min) 2 L/min  Assess: MEWS Score  MEWS Temp 0  MEWS Systolic 1  MEWS Pulse 1  MEWS RR 1  MEWS LOC 0  MEWS Score 3  MEWS Score Color Yellow  Assess: if the MEWS score is Yellow or Red  Were vital signs taken at a resting state? Yes  Focused Assessment No change from prior assessment  Early Detection of Sepsis Score *See Row Information* Low  MEWS guidelines implemented *See Row Information* Yes  Treat  MEWS Interventions Escalated (See documentation below)  Take Vital Signs  Increase Vital Sign Frequency  Yellow: Q 2hr X 2 then Q 4hr X 2, if remains yellow, continue Q 4hrs  Escalate  MEWS: Escalate Yellow: discuss with charge nurse/RN and consider discussing with provider and RRT  Notify: Charge Nurse/RN  Name of Charge Nurse/RN Notified Tobin Chad Currin RN  Date Charge Nurse/RN Notified 12/10/20  Time Charge Nurse/RN Notified 1038  Notify: Provider  Provider Name/Title Dr. Lonny Prude  Date Provider Notified 12/10/20  Time Provider Notified [phone]  Notification Type Call  Response See new orders

## 2020-12-10 NOTE — Progress Notes (Signed)
Bilateral lower extremity venous study completed.   Spoke with Marissa RN   Please see CV Proc for preliminary results.   Vonzell Schlatter, RVT

## 2020-12-10 NOTE — TOC Progression Note (Addendum)
Transition of Care [facility]) - Progression Note    Patient Details  Name: Glenn Gonzalez MRN: [id number] Date of Birth: [birth year]  Transition [facility] CM/SW Contact  Ross Ludwig, [place] Phone Number: 12/10/2020, 10:50 AM  Clinical Narrative:     CSW received a call from Dr. Sonny Dandy an oncologist, [phone].  He wants to assist with helping patient get transferred to [facility].  CSW informed him that this CSW can not assist with transfering to [place], but the attending physician can.  CSW provided the name of the attending physician, and notified attending physician to call Dr. Sonny Dandy, per Dr. Sonny Dandy it was urgent.    CSW received consult that patient needs palliative to follow outpatient.       Expected Discharge Plan and Services  Plan to discharge back home.                                               Social Determinants of Health (SDOH) Interventions    Readmission Risk Interventions No flowsheet data found.

## 2020-12-10 NOTE — Progress Notes (Signed)
[place] for heparin Indication: acute DVT  Allergies  Allergen Reactions  . Digoxin Other (See Comments)    gynecomastia   . Phencyclidine Other (See Comments)    PCP derived antibiotic > Insomnia  . Spironolactone Other (See Comments)    Gynecomastia  . Oxybutynin Chloride     Other reaction(s): low BP  . Lactose Intolerance (Gi) Diarrhea and Other (See Comments)    cramps  . Penicillins Other (See Comments)    Cramps in stomach Did it involve swelling of the face/tongue/throat, SOB, or low BP? No Did it involve sudden or severe rash/hives, skin peeling, or any reaction on the inside of your mouth or nose? No Did you need to seek medical attention at a hospital or doctor's office? No When did it last happen?unknown If all above answers are "NO", may proceed with cephalosporin use.     . Sulfur Rash    Patient Measurements: Height: 6\' 2"  (188 cm) Weight: 104.5 kg (230 lb 6.1 oz) IBW/kg (Calculated) : 82.2 Heparin Dosing Weight: 102 kg  Vital Signs: Temp: 97.8 F (36.6 C) (01/10 1216) Temp Source: Oral (01/10 1216) BP: 105/72 (01/10 1216) Pulse Rate: 99 (01/10 1216)  Labs: Recent Labs    12/08/20 0511 12/09/20 0845 12/09/20 1334 12/10/20 0449  HGB 13.1 12.9*  --   --   HCT 39.0 38.6*  --   --   PLT 446* 463*  --   --   CREATININE 2.07* 2.10* 2.18* 2.35*    Estimated Creatinine Clearance: 43.6 mL/min (A) (by C-G formula based on SCr of 2.35 mg/dL (H)).  Assessment: Patient is a 60 y.o M with hx heart transplant, Hodgkin lymphoma and bladder cancer who was recently hospitalized from 12/30 to 1/3 for PNA.  He also underwent cystoscopy with right ureteral stent placement on 11/29/20 for right hydronephrosis and was also found to have new retroperitoneal lymphadenopathy during this admission.  He went to see his urologist on 1/5 with c/o back pain and was sent to [facility] for further workup.  He underwent CT guided right  retroperitoneal lymph node biopsy on 1/6 with retroperitoneal hemorrhage noted on post-biopsy CT. DDimer was >20 on 1/10 with LE doppler positive for acute RLE DVT. Pharmacy is consulted to start heparin drip for acute VTE.  Goal of Therapy:  Heparin level 0.3-0.7 units/ml Monitor platelets by anticoagulation protocol: Yes   Plan:  - heparin drip at 1400 units/hr (no bolus per MD's request) - check 6 hr heparin level - monitor for s/sx bleeding.  Lynelle Doctor 12/10/2020,2:06 PM

## 2020-12-10 NOTE — Progress Notes (Signed)
[facility]  PROGRESS NOTE  Glenn Gonzalez YIF:[id number] DOB: [birth year] DOA: 12/03/2020 PCP: Lajean Manes, MD Admit date - 12/28/2020   Admitting Physician Desiree Hane, MD  Outpatient Primary MD for the patient is Lajean Manes, MD  LOS - 3 Brief Narrative   Glenn Gonzalez is a 60 y.o. year old male with medical history significant for Cardiac transplant on tacrolimus and prednisone, chronic combined systolic/diastolic CHF, GERD, high-grade T1 bladder cancer with recent hospitalization from 86/76-7/2 for AKI complicated by hydronephrosis in the setting of new retroperitoneal lymphadenopathy patient underwent stent placement by urology and was treated for postoperative multifocal pneumonia.  He presented from urology office with reports of increased back pain not well controlled on oral pain medication as well as generalized fatigue, poor appetite was brought in under observation due to concern for back pain in the setting of known retroperitoneal lymphadenopathy.  Hospital course complicated by acute hyponatremia in the setting of poor appetite requiring IV fluids and persistent leukocytosis.  CT abdomen imaging consistent with retroperitoneal adenopathy and dilation of right renal pelvis and collecting systems concerning for urothelial neoplasm with extensive metastatic process.  Subjective  Pain better controlled. Still has acid reflux. This am got lightheaded on way out of bathroom and needed assistance. Found to become hypoxic with tachycardia to 110 and low BP of 95/56  A & P   Acute Right DVT. Venous duplex confirmed. Last duplex a week ago negative D dimer > 20. Unable to get CTA because of AKI on CKD. TTE shows moderate reduced RV systolic function with TV regurgitation making difficult to measure PA pressure. Required upwards of 5 L O2 with exertion earlier, but at rest requiring 1 L. V/q scan last admission with intermediate probability of PE.  --started IV heparin  without bolus dosing because of issues with gross hematuria on anticoagulation during last hospital stay --monitor CBC --monitor for signs and symptoms of bleeding  Retroperitoneal adenopathy and perinephric nodularity with ongoing back pain.   Biopsy positive for poorly differentiated carcinoma on pathology. ddx includes urothelial carcinoma. Pain better controlled. in setting of immunosuppression differential also includes posttransplant lymphoproliferative disorder/follow-up, patient has prior history of Hodgkin's lymphoma.  Status post IR guided retroperitoneal biopsy on 1/6 - appreciate medical oncology recommendations, sending for molecular testing, and discussing treatment options.  -continue neurochecks - Appreciate palliative medicine assistance with pain control: OxyContin 10 mg twice daily, oxycodone 10 mg every 4 hours as needed moderate pain, IV dilaudid 1 mg every 4 hours as needed severe pain (he needed to start low to ensure good tolerance). - Optimize bowel regimen lactulose and senna docusate  Constipation, improving Had bowel movement this a.m. yesterday Stool noted throughout the colon on CT abdomen.  Patient reports no bowel movement for the past week.  Likely related to opioid regimen for pain control.  - Senna docusate scheduled -lactulose as needed (has taken response in the past)  Acute on chronic hyponatremia, slowly improving.  TSH, cortisol wnl.Nadir of 119. Initially improving with IVF as etiology believed to be diminished oral intake and poor appetite, but now looks volume overloaded on exam with pitting edema and small pleural effusion on cxr concerning for acute on chronic diastolic chf exacebation.  --D/c iV fluids --appreciate nephrology assistance, IV lasix BID, ch -Continue monitor BMP  Pericardial effusion, small. Incidentally noted on TTE. No tamponade physiology. Tachycardia and hypotension presumed related to volume overload in setting of chf  flare -closely monitor on telemetry   Diminished  oral intake, improving.   Likely complicated by retroperitoneal lymphadenopathy as well as some noted ascites though small volume on CT abdomen.  Suspect further complicated by poorly controlled reflux symptoms that are likely exacerbating in setting of his acute illness - Monitor on regular diet -continue IV fluids we will monitor ability to tolerate oral intake  GERD.  Last EGD 08/2020 within normal limits.  Reports increase reflux symptoms but no overt nausea or vomiting; however unable to tolerate p.o. intake.  Suspect exacerbated in the setting of retroperitoneal lymphadenopathy, small volume ascites, and ongoing constipation - Continue home supplied PPI (Aciphex) -Added sucralfate twice daily - Scheduled Reglan for hiccups  Leukocytosis, stable.  After recent discharge  WBC was 19.7, remains essentially the same.  Remains afebrile. -Daily CBC - Blood cultures if becomes febrile  Prior multifocal pneumonia hospitalization.   NO cough. Opacity looks improved on recent CXR -Complete cefdinir course from previous hospitalization, end date 1/10  Acute on Chronic combined systolic and diastolic CHF.  Prior EF of 15% in 2016 last EF on 10/2020 preserved with 60 to 65%. Repeat TTE shows preserved EF. Looks more volume up today with pitting edema of bilateral lower extremities. And small effusion on CXR. --dc IVF --start IV lasix BID - Daily weights - Monitor ins and outs  Gout, stable -continue allopurinol  History of cardiac transplant.  Denies any chest pain. -Continue home supplied tacrolimus, prednisone  AKI on CKD stage II, worsening Most recent baseline creatinine 1.8 from last hospitalization.  Continues to go up to 2.3 likely in setting of volume overload with CHF flare from iatrogenic IVF -avoid nephrotoxins.  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 Irene Limbo also followed by urology as outpatient, appreciate their assistance  Hypothyroidism, TSH stable -continue Synthroid        Family Communication  : None  Code Status : Full  Disposition Plan  :  Patient is from home. Anticipated d/c date: 2 to 3 days. Barriers to d/c or necessity for inpatient status:  IV lasix for CHF flare, IV heparin for acute DVT,   Persistent pain for retroperitoneal lymphadenopathy Consults  :  IR, oncology, palliative medicine  Procedures  : IR guided biopsy on 1/6  DVT Prophylaxis  :  SCDS  MDM: The below labs and imaging reports were reviewed and summarized above.  Medication management as above.  Lab Results  Component Value Date   PLT 463 (H) 12/09/2020    Diet :  Diet Order            DIET SOFT Room service appropriate? Yes; Fluid consistency: Thin  Diet effective now                  Inpatient Medications Scheduled Meds: . allopurinol  200 mg Oral Daily  . azelastine  1 spray Each Nare Daily   And  . fluticasone  1 spray Each Nare Daily  . cefdinir  300 mg Oral BID  . docusate sodium  100 mg Oral BID  . furosemide  60 mg Intravenous Q12H  . levothyroxine  125 mcg Oral Daily  . loratadine  10 mg Oral Daily  . metoCLOPramide  5 mg Oral TID AC & HS  . oxyCODONE  10 mg Oral Q12H  . pantoprazole  40 mg Oral BID  . predniSONE  5 mg Oral Daily  . senna-docusate  2 tablet Oral BID  . sucralfate  1 g Oral BID AC & HS  . Tacrolimus ER  1.5 mg Oral Daily  . traZODone  100 mg Oral QHS   Continuous Infusions: . heparin 1,400 Units/hr (12/10/20 1551)   PRN Meds:.acetaminophen **OR** acetaminophen, calcium carbonate, Glycerin (Adult), HYDROmorphone (DILAUDID) injection, lactulose, magnesium hydroxide, ondansetron **OR** ondansetron (ZOFRAN) IV, oxyCODONE  Antibiotics  :   Anti-infectives (From admission, onward)   Start     Dose/Rate Route Frequency Ordered Stop   12/10/2020 2200  cefdinir (OMNICEF) capsule 300 mg         300 mg Oral 2 times daily 12/27/2020 1716 12/10/20 2159       Objective   Vitals:   12/10/20 1216 12/10/20 1433 12/10/20 1553 12/10/20 1804  BP: 105/72 131/76  109/74  Pulse: 99 (!) 107  (!) 109  Resp: 20 20  16   Temp: 97.8 F (36.6 C) 98 F (36.7 C)  98.7 F (37.1 C)  TempSrc: Oral Oral  Oral  SpO2: 94% 99%  94%  Weight:   106.2 kg 109.6 kg  Height:    6\' 2"  (1.88 m)    SpO2: 94 % O2 Flow Rate (L/min): 1 L/min  Wt Readings from Last 3 Encounters:  12/10/20 109.6 kg  11/29/20 100.2 kg  08/31/20 101.2 kg     Intake/Output Summary (Last 24 hours) at 12/10/2020 2102 Last data filed at 12/10/2020 2000 Gross per 24 hour  Intake 251.78 ml  Output 1400 ml  Net -1148.22 ml    Physical Exam:  Awake Alert, Oriented X 3, Normal affect Normal oral mucosa Able to move all extremities without any focal deficits appreciated [place].AT, Tachycardic, 2+ pitting edema of bilateral lower extremities mainly at ankles but does seem to be in thighs as well Normal respiratory effort on 2L, no conversation dyspnea RRR,No Gallops,Rubs or new Murmurs,  +ve B.Sounds, Abd Soft and distended, No tenderness, No rebound, guarding or rigidity. No Cyanosis, No new Rash or bruise    I have personally reviewed the following:   Data Reviewed:  CBC Recent Labs  Lab 12/19/2020 1923 12/06/20 0519 12/06/20 1756 12/07/20 0503 12/08/20 0511 12/09/20 0845  WBC 19.7* 18.1* 18.4* 17.1*  17.2* 19.3* 17.7*  HGB 14.1 13.5 13.6 13.9  13.7 13.1 12.9*  HCT 42.0 39.5 40.4 40.8  40.6 39.0 38.6*  PLT 418* 459* 446* 438*  440* 446* 463*  MCV 94.0 91.9 94.2 94.2  94.2 95.8 96.0  MCH 31.5 31.4 31.7 32.1  31.8 32.2 32.1  MCHC 33.6 34.2 33.7 34.1  33.7 33.6 33.4  RDW 14.7 14.8 14.9 15.0  15.1 15.4 15.6*  LYMPHSABS 0.8  --   --   --   --   --   MONOABS 2.0*  --   --   --   --   --   EOSABS 0.0  --   --   --   --   --   BASOSABS 0.1  --   --   --   --   --     Chemistries  Recent Labs  Lab  12/17/2020 1923 12/06/20 0519 12/06/20 1020 12/07/20 1310 12/08/20 0511 12/09/20 0845 12/09/20 1334 12/10/20 0449  NA 119* 121*   < > 126* 127* 123* 127* 126*  K 4.2 3.7   < > 3.8 4.3 4.2 4.6 4.7  CL 82* 85*   < > 91* 95* 93* 95* 95*  CO2 25 22   < > 21* 22 21* 21* 20*  GLUCOSE 106*  81   < > 93 104* 101* 107* 94  BUN 32* 30*   < > 34* 34* 34* 36* 39*  CREATININE 1.81* 1.87*   < > 2.06* 2.07* 2.10* 2.18* 2.35*  CALCIUM 9.2 8.6*   < > 8.3* 8.5* 8.7* 9.2 9.1  MG 1.7  --   --   --   --   --   --   --   AST 22 21  --   --   --   --   --  19  ALT 12 11  --   --   --   --   --  9  ALKPHOS 58 54  --   --   --   --   --  59  BILITOT 0.7 0.9  --   --   --   --   --  0.5   < > = values in this interval not displayed.   ------------------------------------------------------------------------------------------------------------------ No results for input(s): CHOL, HDL, LDLCALC, TRIG, CHOLHDL, LDLDIRECT in the last 72 hours.  No results found for: HGBA1C ------------------------------------------------------------------------------------------------------------------ No results for input(s): TSH, T4TOTAL, T3FREE, THYROIDAB in the last 72 hours.  Invalid input(s): FREET3 ------------------------------------------------------------------------------------------------------------------ No results for input(s): VITAMINB12, FOLATE, FERRITIN, TIBC, IRON, RETICCTPCT in the last 72 hours.  Coagulation profile No results for input(s): INR, PROTIME in the last 168 hours.  Recent Labs    12/10/20 1156  DDIMER >20.00*    Cardiac Enzymes No results for input(s): CKMB, TROPONINI, MYOGLOBIN in the last 168 hours.  Invalid input(s): CK ------------------------------------------------------------------------------------------------------------------    Component Value Date/Time   BNP 121.1 (H) 11/30/2020 0031    Micro Results Recent Results (from the past 240 hour(s))  SARS CORONAVIRUS 2 (TAT  6-24 HRS) Nasopharyngeal Nasopharyngeal Swab     Status: None   Collection Time: 12/04/2020  1:48 PM   Specimen: Nasopharyngeal Swab  Result Value Ref Range Status   SARS Coronavirus 2 NEGATIVE NEGATIVE Final    Comment: (NOTE) SARS-CoV-2 target nucleic acids are NOT DETECTED.  The SARS-CoV-2 RNA is generally detectable in upper and lower respiratory specimens during the acute phase of infection. Negative results do not preclude SARS-CoV-2 infection, do not rule out co-infections with other pathogens, and should not be used as the sole basis for treatment or other patient management decisions. Negative results must be combined with clinical observations, patient history, and epidemiological information. The expected result is Negative.  Fact Sheet for Patients: SugarRoll.be  Fact Sheet for Healthcare Providers: https://www.woods-mathews.com/  This test is not yet approved or cleared by the [place] FDA and  has been authorized for detection and/or diagnosis of SARS-CoV-2 by FDA under an Emergency Use Authorization (EUA). This EUA will remain  in effect (meaning this test can be used) for the duration of the COVID-19 declaration under Se ction 564(b)(1) of the Act, 21 U.S.C. section 360bbb-3(b)(1), unless the authorization is terminated or revoked sooner.  Performed at [facility], [place] [address] [place], [place] [address]     Radiology Reports CT ABDOMEN PELVIS WO CONTRAST  Result Date: 12/18/2020 CLINICAL DATA:  Cancer of unknown primary in this 60 year old male. EXAM: CT CHEST, ABDOMEN AND PELVIS WITHOUT CONTRAST TECHNIQUE: Multidetector CT imaging of the chest, abdomen and pelvis was performed following the standard protocol without IV contrast. COMPARISON:  CT abdomen and pelvis of the same date. Also with CT of December 30th of 2021 FINDINGS: CT CHEST FINDINGS Cardiovascular: Calcified atheromatous plaque in the thoracic  aorta. Heart size is normal following sternotomy for heart transplant by report a [facility] medical center. Three-vessel coronary artery calcification. No pericardial effusion. Calcifications in the LEFT atrium partially seen on previous imaging are of uncertain significance central pulmonary vasculature also with some signs of calcification. Mediastinum/Nodes: Ovoid structure associated with LEFT thyroid 11 mm similar to previous imaging, cervical spine from 2017. Thyroid as described above. Calcifications in the LEFT supraclavicular region may be within venous structures on the LEFT. No axillary lymphadenopathy. Surgical clips in the RIGHT axilla and soft tissue thickening over the LEFT pectoralis musculature similar grossly compared to remote MRI evaluation 2018 and surgical clips seen on numerous priors in the RIGHT axilla. No mediastinal lymphadenopathy. No gross hilar lymphadenopathy. Lungs/Pleura: Small RIGHT of pleural effusion and basilar airspace disease. Small nodule in the superior aspect of the RIGHT middle lobe approximately 3 mm on image 57 of series 5. Biapical scarring. Small nodule in the LEFT upper lobe 6 mm (image 51, series 5) airways are patent. Musculoskeletal: Evidence ir sternotomy without significant bony bridging but with sclerotic margins along the sternotomy line similar to studies dating back to August of 2021 with respect to visualized portions on prior abdomen studies no destructive bone finding or acute bone process. CT ABDOMEN PELVIS FINDINGS Hepatobiliary: 1.9 cm lesion in the RIGHT hepatic lobe near the dome of the RIGHT hemi liver does not display attenuation values of simple cyst is unchanged from the study acquired on the same date at another facility. Additionally a 1.4 cm lesion in the RIGHT hepatic lobe on image 23 of series 2 is unchanged. Sludge in the gallbladder. Tiny low-density foci in the LEFT hepatic lobe without change 1 on image 19 of series 2 in the lateral  segment measuring 5 mm and another in the medial segment of the LEFT hepatic lobe measuring 12 mm on image 21 of series 2 these areas appears similar dating back to August of 2020, lesions in the RIGHT hepatic lobe are new. Pancreas: Normal pancreatic contour without signs of inflammation. Spleen: Post splenectomy. Adrenals/Urinary Tract: Adrenal glands, normal on the LEFT and obscured on the RIGHT by perinephric and Peri adrenal nodularity. Fullness of the RIGHT renal pelvis with nephroureteral stent in place showing soft tissue density with distension of the renal pelvis and calices similar to the study acquired on the same date. Nephrolithiasis also unchanged. Perinephric nodularity along the medial upper pole the LEFT kidney measures 6.4 x 2.7 cm which is within 1-2 mm of its previous measurement when measured by this observer on the prior study. Increased in size when compared to the study of November 30, 2020. Renal cortical scarring on the LEFT with nephrolithiasis in the lower pole unchanged from very recent comparison. Urinary bladder with small amount of gas in the urinary bladder with distal aspect of the stent in place, loop coiled in the urinary bladder. Stomach/Bowel: Scattered colonic diverticulosis without acute gastrointestinal process. Normal appendix. Stool and contrast throughout the colon. Vascular/Lymphatic: Calcified atheromatous plaque in the abdominal aorta without aneurysmal dilation. Retroperitoneal adenopathy unchanged from scan earlier the same day, largest lymph nodes behind the inferior vena cava and in the intra-aortocaval groove and in the upper abdomen, largest on image 41 of series 2 measuring 1.9 cm. Smaller lymph nodes along the LEFT periaortic chain. Postoperative changes in the retroperitoneum associated with previous lymphadenectomy No pelvic lymphadenopathy. Reproductive: Prostate with uro lift device deployment bilaterally similar to recent priors. Other: Extensive  retroperitoneal fascial thickening/stranding extending into the  root of the small bowel mesentery, nodular changes associated with this thickening on the RIGHT outlining the renal fascia this crosses the midline where it is without significant nodularity but with fascial thickening and stranding, also tracking into the pelvis. Small amount of fluid in the pelvis. Musculoskeletal: Spinal degenerative changes. No destructive bone process or acute bone finding. IMPRESSION: 1. Retroperitoneal adenopathy and perinephric nodularity with dilation of the RIGHT renal pelvis and collecting systems, filled with what is suspected to represent neoplasm. 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 Differential considerations given heart transplantation and presumed immunosuppression would include posttransplant lymphoproliferative disorder/lymphoma. 2. Dense material in collecting systems may represent a mixture of tumor and or blood and there is profound dilation of collecting systems of the RIGHT kidney. This is unchanged compared to the recent comparison study. 3. Signs of hepatic involvement with new lesions in the RIGHT hepatic lobe. 4. Ascites with small amount of ascites with very subtle infiltration of fat in the omentum not mentioned above, for instance on image 50 of series 2 raising the question of peritoneal involvement as well. This area is in the range of 2-3 mm. 5. Small RIGHT pleural effusion and basilar airspace disease. 6. Postoperative changes about the chest likely related to prior heart transplantation with surgical clips in the RIGHT axilla and thickening and calcification along the anterior pectoralis musculature not changed since 2018. 7. Calcification in the LEFT atrium also about the aortic root and pulmonary artery and to a lesser degree in the RIGHT atrium likely reflects changes of vascular anastomotic  sites in the setting of heart transplantation. 8. 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 9. Post splenectomy. 10. Aortic atherosclerosis. 11. Small nodule in the LEFT upper lobe 6 mm airways are patent. These results will be called to the ordering clinician or representative by the Radiologist Assistant, and communication documented in the PACS or [employer]. Aortic Atherosclerosis (ICD10-I70.0). Electronically Signed   By: Zetta Bills M.D.   On: 12/19/2020 17:38   DG Chest 1 View  Result Date: 11/29/2020 CLINICAL DATA:  Hypoxia EXAM: CHEST  1 VIEW COMPARISON:  08/03/2020 FINDINGS: Since the prior examination, there has developed right basilar consolidation, possibly infectious in the appropriate clinical setting. Mild focal infiltrate is also noted within the right apex. Left lung is clear. No pneumothorax or pleural effusion. Median sternotomy has been performed. Cardiac size within normal limits. Multiple healed right rib fractures are noted. Multiple surgical clips are seen within the right axilla. IMPRESSION: Interval development of multifocal pulmonary infiltrate within the right lung, more focal within the right lung base, suspicious for atypical infection in the acute setting. Electronically Signed   By: Fidela Salisbury MD   On: 11/29/2020 22:27   DG Chest 2 View  Result Date: 12/10/2020 CLINICAL DATA:  Bladder cancer.  Follow-up multifocal pneumonia. EXAM: CHEST - 2 VIEW COMPARISON:  11/29/2020 FINDINGS: Prior median sternotomy. Heart is normal size. Patchy right lung airspace disease in the right apex and right lower lung. Minimal left base linear atelectasis or scarring. Overall aeration in the right lung slightly improved. No effusions. IMPRESSION: Patchy right lung airspace disease with slight improvement since prior study. Left base atelectasis or scarring. Electronically Signed   By: Rolm Baptise M.D.    On: 12/14/2020 13:25   CT CHEST WO CONTRAST  Result Date: 12/07/2020 CLINICAL DATA:  Cancer of unknown primary in this 60 year old male. EXAM: CT CHEST, ABDOMEN AND PELVIS WITHOUT CONTRAST TECHNIQUE: Multidetector CT imaging of the chest, abdomen and pelvis was performed following the standard protocol without IV contrast. COMPARISON:  CT abdomen and pelvis of the same date. Also with CT of December 30th of 2021 FINDINGS: CT CHEST FINDINGS Cardiovascular: Calcified atheromatous plaque in the thoracic aorta. Heart size is normal following sternotomy for heart transplant by report a [facility] medical center. Three-vessel coronary artery calcification. No pericardial effusion. Calcifications in the LEFT atrium partially seen on previous imaging are of uncertain significance central pulmonary vasculature also with some signs of calcification. Mediastinum/Nodes: Ovoid structure associated with LEFT thyroid 11 mm similar to previous imaging, cervical spine from 2017. Thyroid as described above. Calcifications in the LEFT supraclavicular region may be within venous structures on the LEFT. No axillary lymphadenopathy. Surgical clips in the RIGHT axilla and soft tissue thickening over the LEFT pectoralis musculature similar grossly compared to remote MRI evaluation 2018 and surgical clips seen on numerous priors in the RIGHT axilla. No mediastinal lymphadenopathy. No gross hilar lymphadenopathy. Lungs/Pleura: Small RIGHT of pleural effusion and basilar airspace disease. Small nodule in the superior aspect of the RIGHT middle lobe approximately 3 mm on image 57 of series 5. Biapical scarring. Small nodule in the LEFT upper lobe 6 mm (image 51, series 5) airways are patent. Musculoskeletal: Evidence ir sternotomy without significant bony bridging but with sclerotic margins along the sternotomy line similar to studies dating back to August of 2021 with respect to visualized portions on prior abdomen studies no  destructive bone finding or acute bone process. CT ABDOMEN PELVIS FINDINGS Hepatobiliary: 1.9 cm lesion in the RIGHT hepatic lobe near the dome of the RIGHT hemi liver does not display attenuation values of simple cyst is unchanged from the study acquired on the same date at another facility. Additionally a 1.4 cm lesion in the RIGHT hepatic lobe on image 23 of series 2 is unchanged. Sludge in the gallbladder. Tiny low-density foci in the LEFT hepatic lobe without change 1 on image 19 of series 2 in the lateral segment measuring 5 mm and another in the medial segment of the LEFT hepatic lobe measuring 12 mm on image 21 of series 2 these areas appears similar dating back to August of 2020, lesions in the RIGHT hepatic lobe are new. Pancreas: Normal pancreatic contour without signs of inflammation. Spleen: Post splenectomy. Adrenals/Urinary Tract: Adrenal glands, normal on the LEFT and obscured on the RIGHT by perinephric and Peri adrenal nodularity. Fullness of the RIGHT renal pelvis with nephroureteral stent in place showing soft tissue density with distension of the renal pelvis and calices similar to the study acquired on the same date. Nephrolithiasis also unchanged. Perinephric nodularity along the medial upper pole the LEFT kidney measures 6.4 x 2.7 cm which is within 1-2 mm of its previous measurement when measured by this observer on the prior study. Increased in size when compared to the study of November 30, 2020. Renal cortical scarring on the LEFT with nephrolithiasis in the lower pole unchanged from very recent comparison. Urinary bladder with small amount of gas in the urinary bladder with distal aspect of the stent in place, loop coiled in the urinary bladder. Stomach/Bowel: Scattered colonic diverticulosis without acute gastrointestinal process. Normal appendix. Stool and contrast throughout the colon. Vascular/Lymphatic: Calcified atheromatous plaque in the abdominal aorta without aneurysmal  dilation. Retroperitoneal adenopathy unchanged from scan earlier the same day, largest lymph nodes behind  the inferior vena cava and in the intra-aortocaval groove and in the upper abdomen, largest on image 41 of series 2 measuring 1.9 cm. Smaller lymph nodes along the LEFT periaortic chain. Postoperative changes in the retroperitoneum associated with previous lymphadenectomy No pelvic lymphadenopathy. Reproductive: Prostate with uro lift device deployment bilaterally similar to recent priors. Other: Extensive retroperitoneal fascial thickening/stranding extending into the root of the small bowel mesentery, nodular changes associated with this thickening on the RIGHT outlining the renal fascia this crosses the midline where it is without significant nodularity but with fascial thickening and stranding, also tracking into the pelvis. Small amount of fluid in the pelvis. Musculoskeletal: Spinal degenerative changes. No destructive bone process or acute bone finding. IMPRESSION: 1. Retroperitoneal adenopathy and perinephric nodularity with dilation of the RIGHT renal pelvis and collecting systems, filled with what is suspected to represent neoplasm. 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 Differential considerations given heart transplantation and presumed immunosuppression would include posttransplant lymphoproliferative disorder/lymphoma. 2. Dense material in collecting systems may represent a mixture of tumor and or blood and there is profound dilation of collecting systems of the RIGHT kidney. This is unchanged compared to the recent comparison study. 3. Signs of hepatic involvement with new lesions in the RIGHT hepatic lobe. 4. Ascites with small amount of ascites with very subtle infiltration of fat in the omentum not mentioned above, for instance on image 50 of series 2 raising the question of peritoneal  involvement as well. This area is in the range of 2-3 mm. 5. Small RIGHT pleural effusion and basilar airspace disease. 6. Postoperative changes about the chest likely related to prior heart transplantation with surgical clips in the RIGHT axilla and thickening and calcification along the anterior pectoralis musculature not changed since 2018. 7. Calcification in the LEFT atrium also about the aortic root and pulmonary artery and to a lesser degree in the RIGHT atrium likely reflects changes of vascular anastomotic sites in the setting of heart transplantation. 8. 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 9. Post splenectomy. 10. Aortic atherosclerosis. 11. Small nodule in the LEFT upper lobe 6 mm airways are patent. These results will be called to the ordering clinician or representative by the Radiologist Assistant, and communication documented in the PACS or [employer]. Aortic Atherosclerosis (ICD10-I70.0). Electronically Signed   By: Zetta Bills M.D.   On: 12/02/2020 17:38   NM Pulmonary Perfusion  Result Date: 12/19/2020 CLINICAL DATA:  Respiratory failure EXAM: NUCLEAR MEDICINE PERFUSION LUNG SCAN TECHNIQUE: Perfusion images were obtained in multiple projections after intravenous injection of radiopharmaceutical. Ventilation scans intentionally deferred if perfusion scan and chest x-ray adequate for interpretation during COVID 19 epidemic. RADIOPHARMACEUTICALS:  4.1 mCi Tc-67m MAA IV COMPARISON:  Chest x-ray 12/11/2020 FINDINGS: Slightly heterogeneous distribution of radiotracer within the lung fields. Multiple small bilateral wedge-shaped perfusion defects. No corresponding radiographic abnormality. No large mismatched segmental perfusion defect. IMPRESSION: Intermediate probability for pulmonary embolism. Electronically Signed   By: Davina Poke D.O.   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 ANESTHESIA/SEDATION: Fentanyl 100 mcg IV; Versed 4 mg IV Sedation time: 14 minutes; The patient was continuously monitored during the procedure by the interventional radiology nurse under my direct supervision.  CONTRAST:  None. COMPLICATIONS: SIR Level A - No therapy, no consequence. Retroperitoneal hemorrhage at site of biopsy, controlled with Gel-Foam slurry administration along needle track. PROCEDURE: Informed consent was obtained from the patient following an explanation of the procedure, risks, benefits and alternatives. A time out was performed prior to the initiation of the procedure. The patient was positioned in a partial left lateral decubitus position on the CT table and a limited CT was performed for procedural planning demonstrating similar appearing multifocal right retroperitoneal lymphadenopathy. The procedure was planned. The operative site was prepped and draped in the usual sterile fashion. Appropriate trajectory was confirmed with a 22 gauge spinal needle after the adjacent tissues were anesthetized with 1% Lidocaine with epinephrine. Under intermittent CT guidance, a 17 gauge coaxial needle was advanced into the peripheral aspect of the mass. Appropriate positioning was confirmed and a total of 4 samples were obtained with an 18 gauge core needle biopsy device. A limited CT demonstrated focal hemorrhage about the biopsied lymph node. Gel-Foam slurry was injected through the introducer needle along the needle track. The co-axial needle was removed and hemostasis was achieved with manual compression. Additional limited postprocedural CT was negative for expanding hemorrhage or additional complication. A dressing was placed. The patient tolerated the procedure well without  immediate postprocedural complication. IMPRESSION: Technically successful CT guided core needle biopsy of right retroperitoneal lymph node. Ruthann Cancer, MD [facility] Electronically Signed   By: Ruthann Cancer MD   On: 12/07/2020 08:53   DG CHEST PORT 1 VIEW  Result Date: 12/10/2020 CLINICAL DATA:  Hypoxia.  History of cardiac transplant EXAM: PORTABLE CHEST 1 VIEW COMPARISON:  12/10/2020 FINDINGS: Prior median sternotomy. Heart size within normal limits. Atherosclerotic calcification of the aortic knob. Subtle right lower lobe opacity, slightly improved from prior. Trace right pleural effusion. No pneumothorax. IMPRESSION: 1. Subtle right lower lobe opacity, slightly improved from prior. 2. Trace right pleural effusion. Electronically Signed   By: Davina Poke D.O.   On: 12/10/2020 10:33   DG C-Arm 1-60 Min-No Report  Result Date: 11/29/2020 Fluoroscopy was utilized by the requesting physician.  No radiographic interpretation.   ECHOCARDIOGRAM COMPLETE  Result Date: 11/30/2020    ECHOCARDIOGRAM REPORT   Patient Name:   NOLE ROBEY Gilpin Date of Exam: 11/30/2020 Medical Rec #:  [id number]         Height:       74.0 in Accession #:    [id number]        Weight:       221.0 lb Date of Birth:  Jun 11, 1961        BSA:          2.268 m Patient Age:    16 years          BP:           103/59 mmHg Patient Gender: M                 HR:           94 bpm. Exam Location:  Inpatient Procedure: 2D Echo, Cardiac Doppler, Color Doppler and Intracardiac            Opacification Agent Indications:    Dyspnea R06.00  History:        Patient has prior history of Echocardiogram examinations, most                 recent 05/12/2017. Arrythmias:Atrial Fibrillation; Risk  Factors:Non-Smoker. GERD. 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  Sonographer:    Vickie Epley RDCS Referring Phys: [id number] [facility]  Sonographer Comments: Suboptimal apical window and  suboptimal subcostal window. Pt unable to turn on side due to herniated discs. IMPRESSIONS  1. Left ventricular ejection fraction, by estimation, is 60 to 65%. The left ventricle has normal function. The left ventricle has no regional wall motion abnormalities. Left ventricular diastolic parameters were normal.  2. Right ventricular systolic function is normal. The right ventricular size is normal. There is normal pulmonary artery systolic pressure. The estimated right ventricular systolic pressure is 01.7 mmHg.  3. The mitral valve is normal in structure. No evidence of mitral valve regurgitation. No evidence of mitral stenosis.  4. The aortic valve is normal in structure. Aortic valve regurgitation is not visualized. No aortic stenosis is present.  5. The inferior vena cava is normal in size with greater than 50% respiratory variability, suggesting right atrial pressure of 3 mmHg. FINDINGS  Left Ventricle: Left ventricular ejection fraction, by estimation, is 60 to 65%. The left ventricle has normal function. The left ventricle has no regional wall motion abnormalities. Definity contrast agent was given IV to delineate the left ventricular  endocardial borders. The left ventricular internal cavity size was normal in size. There is no left ventricular hypertrophy. Left ventricular diastolic parameters were normal. Normal left ventricular filling pressure. Right Ventricle: The right ventricular size is normal. No increase in right ventricular wall thickness. Right ventricular systolic function is normal. There is normal pulmonary artery systolic pressure. The tricuspid regurgitant velocity is 2.39 m/s, and  with an assumed right atrial pressure of 3 mmHg, the estimated right ventricular systolic pressure is 79.3 mmHg. Left Atrium: Left atrial size was normal in size. Right Atrium: Right atrial size was normal in size. Pericardium: There is no evidence of pericardial effusion. Mitral Valve: The mitral valve is normal  in structure. No evidence of mitral valve regurgitation. No evidence of mitral valve stenosis. Tricuspid Valve: The tricuspid valve is normal in structure. Tricuspid valve regurgitation is mild . No evidence of tricuspid stenosis. Aortic Valve: The aortic valve is normal in structure. Aortic valve regurgitation is not visualized. No aortic stenosis is present. Pulmonic Valve: The pulmonic valve was normal in structure. Pulmonic valve regurgitation is not visualized. No evidence of pulmonic stenosis. Aorta: The aortic root is normal in size and structure. Venous: The inferior vena cava is normal in size with greater than 50% respiratory variability, suggesting right atrial pressure of 3 mmHg. IAS/Shunts: No atrial level shunt detected by color flow Doppler.  LEFT VENTRICLE PLAX 2D LVIDd:         4.40 cm     Diastology LVIDs:         3.20 cm     LV e' medial:    10.00 cm/s LV PW:         0.90 cm     LV E/e' medial:  7.5 LV IVS:        0.90 cm     LV e' lateral:   11.30 cm/s LVOT diam:     2.50 cm     LV E/e' lateral: 6.7 LV SV:         48 LV SV Index:   21 LVOT Area:     4.91 cm  LV Volumes (MOD) LV vol d, MOD A4C: 80.2 ml LV vol s, MOD A4C: 30.4 ml LV SV MOD A4C:     80.2 ml LEFT ATRIUM  Index LA diam:      2.70 cm 1.19 cm/m LA Vol (A4C): 16.3 ml 7.19 ml/m  AORTIC VALVE LVOT Vmax:   50.20 cm/s LVOT Vmean:  36.700 cm/s LVOT VTI:    0.099 m  AORTA Ao Root diam: 3.40 cm MITRAL VALVE               TRICUSPID VALVE MV Area (PHT): 5.38 cm    TR Peak grad:   22.8 mmHg MV Decel Time: 141 msec    TR Vmax:        239.00 cm/s MV E velocity: 75.40 cm/s MV A velocity: 36.00 cm/s  SHUNTS MV E/A ratio:  2.09        Systemic VTI:  0.10 m                            Systemic Diam: 2.50 cm Dani Gobble Croitoru MD Electronically signed by Sanda Klein MD Signature Date/Time: 11/30/2020/12:30:40 PM    Final    VAS [place] LOWER EXTREMITY VENOUS (DVT)  Result Date: 12/10/2020  Lower Venous DVT Study Indications: Pain.  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 Comparison Study: Previous 12/02/20 Negative Performing Technologist: Vonzell Schlatter RVT  Examination Guidelines: A complete evaluation includes B-mode imaging, spectral Doppler, color Doppler, and power Doppler as needed of all accessible portions of each vessel. Bilateral testing is considered an integral part of a complete examination. Limited examinations for reoccurring indications may be performed as noted. The reflux portion of the exam is performed with the patient in reverse Trendelenburg.  +---------+---------------+---------+-----------+----------+--------------+ RIGHT    CompressibilityPhasicitySpontaneityPropertiesThrombus Aging +---------+---------------+---------+-----------+----------+--------------+ CFV      Full           Yes      Yes                                 +---------+---------------+---------+-----------+----------+--------------+ SFJ      Full                                                        +---------+---------------+---------+-----------+----------+--------------+ FV Prox  Full                                                        +---------+---------------+---------+-----------+----------+--------------+ FV Mid   Full                                                        +---------+---------------+---------+-----------+----------+--------------+ FV DistalFull                                                        +---------+---------------+---------+-----------+----------+--------------+ PFV      Full                                                        +---------+---------------+---------+-----------+----------+--------------+  POP      Full           Yes      Yes                                 +---------+---------------+---------+-----------+----------+--------------+ PTV      None                                                         +---------+---------------+---------+-----------+----------+--------------+ PERO     None                                                        +---------+---------------+---------+-----------+----------+--------------+   +---------+---------------+---------+-----------+----------+--------------+ LEFT     CompressibilityPhasicitySpontaneityPropertiesThrombus Aging +---------+---------------+---------+-----------+----------+--------------+ CFV      Full           Yes      Yes                                 +---------+---------------+---------+-----------+----------+--------------+ SFJ      Full                                                        +---------+---------------+---------+-----------+----------+--------------+ FV Prox  Full                                                        +---------+---------------+---------+-----------+----------+--------------+ FV Mid   Full                                                        +---------+---------------+---------+-----------+----------+--------------+ FV DistalFull                                                        +---------+---------------+---------+-----------+----------+--------------+ PFV      Full                                                        +---------+---------------+---------+-----------+----------+--------------+ POP      Full           Yes      Yes                                 +---------+---------------+---------+-----------+----------+--------------+  PTV      Full                                                        +---------+---------------+---------+-----------+----------+--------------+ PERO     Full                                                        +---------+---------------+---------+-----------+----------+--------------+     Summary: RIGHT: - Findings consistent with acute deep vein thrombosis involving one right posterior tibial vein,  and both right peroneal veins. - No cystic structure found in the popliteal fossa.  LEFT: - There is no evidence of deep vein thrombosis in the lower extremity.  - No cystic structure found in the popliteal fossa.  *See table(s) above for measurements and observations. Electronically signed by Jamelle Haring on 12/10/2020 at 7:16:00 PM.    Final    VAS [place] LOWER EXTREMITY VENOUS (DVT)  Result Date: 12/03/2020  Lower Venous DVT Study Indications: Pain.  Comparison Study: Previous 12/2019 Performing Technologist: Vonzell Schlatter RVT  Examination Guidelines: A complete evaluation includes B-mode imaging, spectral Doppler, color Doppler, and power Doppler as needed of all accessible portions of each vessel. Bilateral testing is considered an integral part of a complete examination. Limited examinations for reoccurring indications may be performed as noted. The reflux portion of the exam is performed with the patient in reverse Trendelenburg.  +---------+---------------+---------+-----------+----------+--------------+ RIGHT    CompressibilityPhasicitySpontaneityPropertiesThrombus Aging +---------+---------------+---------+-----------+----------+--------------+ CFV      Full           Yes      Yes                                 +---------+---------------+---------+-----------+----------+--------------+ SFJ      Full                                                        +---------+---------------+---------+-----------+----------+--------------+ FV Prox  Full                                                        +---------+---------------+---------+-----------+----------+--------------+ FV Mid   Full                                                        +---------+---------------+---------+-----------+----------+--------------+ FV DistalFull                                                        +---------+---------------+---------+-----------+----------+--------------+  PFV       Full                                                        +---------+---------------+---------+-----------+----------+--------------+ POP      Full           Yes      Yes                                 +---------+---------------+---------+-----------+----------+--------------+ PTV      Full                                                        +---------+---------------+---------+-----------+----------+--------------+ PERO     Full                                                        +---------+---------------+---------+-----------+----------+--------------+   +---------+---------------+---------+-----------+----------+--------------+ LEFT     CompressibilityPhasicitySpontaneityPropertiesThrombus Aging +---------+---------------+---------+-----------+----------+--------------+ CFV      Full           Yes      Yes                                 +---------+---------------+---------+-----------+----------+--------------+ SFJ      Full                                                        +---------+---------------+---------+-----------+----------+--------------+ FV Prox  Full                                                        +---------+---------------+---------+-----------+----------+--------------+ FV Mid   Full                                                        +---------+---------------+---------+-----------+----------+--------------+ FV DistalFull                                                        +---------+---------------+---------+-----------+----------+--------------+ PFV      Full                                                        +---------+---------------+---------+-----------+----------+--------------+  POP      Full           Yes      Yes                                 +---------+---------------+---------+-----------+----------+--------------+ PTV      Full                                                         +---------+---------------+---------+-----------+----------+--------------+ PERO     Full                                                        +---------+---------------+---------+-----------+----------+--------------+     Summary: RIGHT: - There is no evidence of deep vein thrombosis in the lower extremity.  - No cystic structure found in the popliteal fossa.  LEFT: - There is no evidence of deep vein thrombosis in the lower extremity.  - No cystic structure found in the popliteal fossa.  *See table(s) above for measurements and observations. Electronically signed by Ruta Hinds MD on 12/03/2020 at 12:15:41 PM.    Final    ECHOCARDIOGRAM LIMITED  Result Date: 12/10/2020    ECHOCARDIOGRAM LIMITED REPORT   Patient Name:   BRAEDAN MEUTH Saville Date of Exam: 12/10/2020 Medical Rec #:  [id number]         Height:       74.0 in Accession #:    [id number]        Weight:       230.4 lb Date of Birth:  November 11, 1961        BSA:          2.308 m Patient Age:    60 years          BP:           105/72 mmHg Patient Gender: M                 HR:           99 bpm. Exam Location:  Inpatient Procedure: Limited Echo, Cardiac Doppler and Color Doppler Indications:    Acute DVT  History:        Patient has prior history of Echocardiogram examinations, most                 recent 11/30/2020. Arrythmias:Atrial Fibrillation. CKD. GERD.                 S/P heart transplant 01/22/16.  Sonographer:    Clayton Lefort RDCS (AE) Referring Phys: [id number] [facility] D Tobyn Osgood  Sonographer Comments: Technically difficult study due to poor echo windows, suboptimal apical window and suboptimal subcostal window. Unable to roll patient due to herniated discs. IMPRESSIONS  1. S/P Heart Transplant. Left ventricular ejection fraction, by estimation, is 60 to 65%. The left ventricle has normal function. Left ventricular endocardial border not optimally defined to evaluate regional wall motion. There is moderate concentric left ventricular  hypertrophy. Left ventricular diastolic function could not be evaluated.  2. Right ventricular systolic function is moderately reduced. The right ventricular size is mildly  enlarged. Tricuspid regurgitation signal is inadequate for assessing PA pressure.  3. A small pericardial effusion is present. The pericardial effusion is surrounding the apex.  4. Tricuspid valve regurgitation is mild to moderate. FINDINGS  Left Ventricle: S/P Heart Transplant. Left ventricular ejection fraction, by estimation, is 60 to 65%. The left ventricle has normal function. Left ventricular endocardial border not optimally defined to evaluate regional wall motion. There is moderate concentric left ventricular hypertrophy. Left ventricular diastolic function could not be evaluated. Right Ventricle: The right ventricular size is mildly enlarged. Right ventricular systolic function is moderately reduced. Tricuspid regurgitation signal is inadequate for assessing PA pressure. Pericardium: A small pericardial effusion is present. The pericardial effusion is surrounding the apex. Tricuspid Valve: Tricuspid valve regurgitation is mild to moderate. Venous: The inferior vena cava was not well visualized. LEFT VENTRICLE PLAX 2D LVIDd:         3.60 cm LVIDs:         2.30 cm LV PW:         1.40 cm LV IVS:        1.50 cm LVOT diam:     2.50 cm LVOT Area:     4.91 cm  LEFT ATRIUM         Index LA diam:    2.80 cm 1.21 cm/m   AORTA Ao Root diam: 3.70 cm Ao Asc diam:  3.10 cm TRICUSPID VALVE TR Peak grad:   52.4 mmHg TR Vmax:        362.00 cm/s  SHUNTS Systemic Diam: 2.50 cm Fransico Him MD Electronically signed by Fransico Him MD Signature Date/Time: 12/10/2020/3:35:06 PM    Final      Time Spent in minutes  30     Desiree Hane M.D on 12/10/2020 at 9:02 PM  To page go to [email or web address] - password [facility]

## 2020-12-10 NOTE — Progress Notes (Signed)
PMT brief progress note.   Patient seen briefly, currently on the phone, appears comfortable, undergoing a procedure.   BP 131/76 (BP Location: Right Arm)   Pulse (!) 107   Temp 98 F (36.7 C) (Oral)   Resp 20   Ht 6\' 2"  (1.88 m)   Wt 104.5 kg   SpO2 99%   BMI 29.58 kg/m  Appears comfortable.  Chart reviewed, CSW note reviewed.  Medication history noted. Patient started on scheduled opioids as well as remains on PO PRN opioids. Continue current opioids. No additional palliative specific recommendations at this time.  Greater than 50%  of this time was spent counseling and coordinating care related to the above assessment and plan. 15 minutes spent.  Loistine Chance MD [place] palliative.

## 2020-12-10 NOTE — Progress Notes (Signed)
  Echocardiogram 2D Echocardiogram has been performed.  Glenn Gonzalez 12/10/2020, 3:14 PM

## 2020-12-10 NOTE — Consult Note (Signed)
Renal Service Consult Note [facility]  [place] 12/10/2020 Sol Blazing, MD Requesting Physician: Dr Lonny Prude, Chauncey Cruel.   Reason for Consult: Renal failure HPI: The patient is a 60 y.o. year-old w/ hx of Hodgkin's lymphoma, bladder cancer, combined CHF, hx cardiac transplant, GERD presenting w/ back pain. Recent admit for back pain and hydronephrosis. At that time he had stent placed and new LAN showed up on imaging. He was told to go to oncology. He went to urology for worsening back pain. Stents were in good position. He was sent to hospital for further eval. He was admitting for observation for back pain w/ known RP lymphadenopathy. During hosp stay pt developed some hyponatremia, w/ poor appetite, ^WBC also. CT abd showed RP adenopathy and dilatation for the R renal pelvis and collecting systems concerning for metastatic process. Pt w/ hx heart transplant, high-grade T1 bladder cancer, and prior hx of Hodgkin's lymphoma, f/b Dr Irene Limbo.       ROS  denies CP  no joint pain   no HA  no blurry vision  no rash  no diarrhea  no nausea/ vomiting  no dysuria  no difficulty voiding  no change in urine color    Past Medical History  Past Medical History:  Diagnosis Date  . A-fib ([place])    IN PAST with old heart   . Bladder tumor   . Bulging lumbar disc    states " i threw my back out on tuesday 6-16, i ahve a bulging disc, but i can lie on my back a side "   . Chronic fatigue   . Chronic kidney disease    CREATININE NOW 1.5 stage 1 ckd  . Colon cancer ([place]) DX 2019   SURGERY DONE  . Complication of anesthesia    AFTER HEART TRANSPLANT TOOK 2 YEARS TO GET BACK TO SELF; patient does not want aneshesia for endoscopy procedures   . Complication of anesthesia    likes propofol wants as few of anesthesia drugs as possible  . Depression    SITUATIONAL  . GERD (gastroesophageal reflux disease)   . Gout yrs ago  . History of chemotherapy 1982 for hodgkins    DAMAGED HEART MUSCLE WITH RADIATION ALSO  . History of kidney stones   . History of radiation therapy 1982   for hodgkins lymphoma  . Hodgkin's lymphoma ([place]) 1980   IIIB. 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  . Hypothyroidism   . Pneumonia FROM CHF 2016   recurrent pneumonia sedf.  rad tx for lymphoma  . Skin abnormality    right chest small area with squamous cell area removed 3 weeks ago as of 07-27-2020  . Skin cancer    AREAS REMOVED FROM [facility], CHECK AND BACK AND HEAD SEPT 2019  . Status post heart transplantation ([place]) 2017   caused by chemo drug adriomycin   Past Surgical History  Past Surgical History:  Procedure Laterality Date  . BIOPSY  07/02/2018   Procedure: BIOPSY;  Surgeon: Carol Ada, MD;  Location: [facility];  Service: Endoscopy;;  . BIOPSY  05/20/2019   Procedure: BIOPSY;  Surgeon: Carol Ada, MD;  Location: [facility];  Service: Endoscopy;;  . CARDIAC CATHETERIZATION N/A 04/20/2015   Procedure: Right/Left Heart Cath and Coronary Angiography;  Surgeon: Jolaine Artist, MD;  Location: [place] [facility];  Service: Cardiovascular;  Laterality: N/A;  . CARDIAC CATHETERIZATION  10-11-15   pt states routine heart cath to be  done at [facility]   . CARDIAC CATHETERIZATION N/A 04/02/2016   Procedure: Right Heart Cath;  Surgeon: Jolaine Artist, MD;  Location: [place] [facility];  Service: Cardiovascular;  Laterality: N/A;  . CARDIAC CATHETERIZATION  JULY 2019 AT [facility]  . COLONOSCOPY N/A 07/02/2018   Procedure: COLONOSCOPY;  Surgeon: Carol Ada, MD;  [facility];  Service: Endoscopy;  Laterality: N/A;  . COLONOSCOPY N/A 08/05/2019   Procedure: COLONOSCOPY;  Surgeon: Carol Ada, MD;  [facility];  Service: Endoscopy;  Laterality: N/A;  . COLONOSCOPY WITH PROPOFOL N/A 08/03/2015   Procedure: COLONOSCOPY WITH PROPOFOL;  Surgeon: Carol Ada, MD;  [facility];  Service: Endoscopy;  Laterality: N/A;  wants to try to do procedure  without sedation  . COLONOSCOPY WITH PROPOFOL N/A 08/10/2020   Procedure: COLONOSCOPY WITH PROPOFOL;  Surgeon: Carol Ada, MD;  [facility];  Service: Endoscopy;  Laterality: N/A;  . CYSTOSCOPY N/A 08/28/2020   Procedure: CYSTOSCOPY;  Surgeon: Remi Haggard, MD;  [facility]: [facility];  Service: Urology;  Laterality: N/A;  . CYSTOSCOPY W/ RETROGRADES Bilateral 07/31/2020   Procedure: CYSTOSCOPY WITH RETROGRADE PYELOGRAM;  Surgeon: Remi Haggard, MD;  Location: [facility];  Service: Urology;  Laterality: Bilateral;  . CYSTOSCOPY W/ URETERAL STENT PLACEMENT Right 11/29/2020   Procedure: CYSTOSCOPY WITH RETROGRADE PYELOGRAM/URETERAL STENT PLACEMENT;  Surgeon: Festus Aloe, MD;  [facility];  Service: Urology;  Laterality: Right;  . CYSTOSCOPY WITH INSERTION OF UROLIFT N/A 10/18/2018   Procedure: CYSTOSCOPY WITH INSERTION OF UROLIFT;  Surgeon: Cleon Gustin, MD;  Location: [facility];  Service: Urology;  Laterality: N/A;  30 MINS  . CYSTOSCOPY WITH RETROGRADE PYELOGRAM, URETEROSCOPY AND STENT PLACEMENT Right 10/12/2015   Procedure: CYSTOSCOPY WITH RETROGRADE PYELOGRAM, URETEROSCOPY AND STENT PLACEMENT;  Surgeon: Cleon Gustin, MD;  [facility];  Service: Urology;  Laterality: Right;  . ESOPHAGOGASTRODUODENOSCOPY    . ESOPHAGOGASTRODUODENOSCOPY (EGD) WITH PROPOFOL N/A 09/29/2019   Procedure: ESOPHAGOGASTRODUODENOSCOPY (EGD) WITH PROPOFOL;  Surgeon: Carol Ada, MD;  [facility];  Service: Endoscopy;  Laterality: N/A;  . ESOPHAGOGASTRODUODENOSCOPY (EGD) WITH PROPOFOL N/A 08/10/2020   Procedure: ESOPHAGOGASTRODUODENOSCOPY (EGD) WITH PROPOFOL;  Surgeon: Carol Ada, MD;  [facility];  Service: Endoscopy;  Laterality: N/A;  . EXPLORATORY LAPAROTOMY  1982   staging laparotomy  . FLEXIBLE SIGMOIDOSCOPY N/A 05/20/2019   Procedure: FLEXIBLE SIGMOIDOSCOPY;  Surgeon: Carol Ada, MD;  [facility];   Service: Endoscopy;  Laterality: N/A;  . FLEXIBLE SIGMOIDOSCOPY N/A 04/20/2020   Procedure: FLEXIBLE SIGMOIDOSCOPY;  Surgeon: Carol Ada, MD;  [facility];  Service: Endoscopy;  Laterality: N/A;  . gynemastia Bilateral   . HEART TRANSPLANT  06/20/2016   at [facility]  . HOT HEMOSTASIS N/A 07/02/2018   Procedure: HOT HEMOSTASIS (ARGON PLASMA COAGULATION/BICAP);  Surgeon: Carol Ada, MD;  [facility];  Service: Endoscopy;  Laterality: N/A;  . LUMBAR LAMINECTOMY/DECOMPRESSION MICRODISCECTOMY Right 01/04/2016   Procedure: Microdiscectomy Lumbar four Lumbar five right;  Surgeon: Eustace Moore, MD;  Location: [place] [facility];  Service: Neurosurgery;  Laterality: Right;  . neck biopses     x5  . 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 POLYPECTOMY  07/02/2018   Procedure: POLYPECTOMY;  Surgeon: Carol Ada, MD;  [facility];  Service: Endoscopy;;  . POLYPECTOMY  08/05/2019   Procedure: POLYPECTOMY;  Surgeon: Carol Ada, MD;  [facility];  Service: Endoscopy;;  . POLYPECTOMY  04/20/2020   Procedure: POLYPECTOMY;  Surgeon: Carol Ada, MD;  Location[facility];  Service: Endoscopy;;  . POLYPECTOMY  08/10/2020   Procedure: POLYPECTOMY;  Surgeon: Carol Ada, MD;  [facility];  Service: Endoscopy;;  . PORTACATH PLACEMENT     06-29-15 inserted, now removed.  . SPLENECTOMY, TOTAL  1981  . STONE EXTRACTION WITH BASKET Right 10/12/2015   Procedure: STONE EXTRACTION WITH BASKET;  Surgeon: Cleon Gustin, MD;  [facility];  Service: Urology;  Laterality: Right;  . SUBMUCOSAL INJECTION  07/02/2018   Procedure: SUBMUCOSAL INJECTION;  Surgeon: Carol Ada, MD;  [facility];  Service: Endoscopy;;  . TONSILLECTOMY  as child  . TRANSURETHRAL RESECTION OF BLADDER TUMOR N/A 07/31/2020   Procedure: TRANSURETHRAL RESECTION OF BLADDER TUMOR (TURBT) FULGERATION;  Surgeon: Remi Haggard, MD;   Location: [facility];  Service: Urology;  Laterality: N/A;  . TRANSURETHRAL RESECTION OF BLADDER TUMOR N/A 08/28/2020   Procedure: TRANSURETHRAL RESECTION OF BLADDER TUMOR (TURBT);  Surgeon: Remi Haggard, MD;  [facility];  Service: Urology;  Laterality: N/A;  30 MINS  . VARICELE REPAIR  77 YRS AGO   Family History  Family History  Problem Relation Age of Onset  . Hyperthyroidism Mother   . Insomnia Mother   . Hypertension Father   . Depression Father   . Insomnia Father    Social History  reports that he has never smoked. He has never used smokeless tobacco. He reports that he does not drink alcohol and does not use drugs. Allergies  Allergies  Allergen Reactions  . Digoxin Other (See Comments)    gynecomastia   . Phencyclidine Other (See Comments)    PCP derived antibiotic > Insomnia  . Spironolactone Other (See Comments)    Gynecomastia  . Oxybutynin Chloride     Other reaction(s): low BP  . Lactose Intolerance (Gi) Diarrhea and Other (See Comments)    cramps  . Penicillins Other (See Comments)    Cramps in stomach Did it involve swelling of the face/tongue/throat, SOB, or low BP? No Did it involve sudden or severe rash/hives, skin peeling, or any reaction on the inside of your mouth or nose? No Did you need to seek medical attention at a hospital or doctor's office? No When did it last happen?unknown If all above answers are "NO", may proceed with cephalosporin use.     . Sulfur Rash   Home medications Prior to Admission medications   Medication Sig Start Date End Date Taking? Authorizing Provider  acetaminophen (TYLENOL) 650 MG CR tablet Take 1,300 mg by mouth daily as needed for pain.   Yes [provider]  allopurinol (ZYLOPRIM) 100 MG tablet Take 200 mg by mouth daily.  01/04/18  Yes [provider]  Azelastine-Fluticasone 137-50 MCG/ACT SUSP Place 1 spray into both nostrils daily. 11/27/20  Yes [provider]  calcium carbonate (TUMS) 500 MG chewable tablet Chew 2,000 mg by mouth daily as needed for indigestion or heartburn.   Yes [provider]  cefdinir (OMNICEF) 300 MG capsule Take 1 capsule (300 mg total) by mouth 2 (two) times daily for 7 days. Patient taking differently: Take 300 mg by mouth 2 (two) times daily. Start date: 12/03/20 12/03/20 12/10/20 Yes Dahal, Marlowe Aschoff, MD  cetirizine (ZYRTEC) 10 MG tablet Take 10 mg by mouth at bedtime.    Yes [provider]  cyclobenzaprine (FLEXERIL) 10 MG tablet Take 10 mg by mouth 3 (three) times daily as  needed for muscle spasms.   Yes [provider]  ENVARSUS XR 0.75 MG TB24 Take 1.5 mg by mouth daily. 08/07/20  Yes [provider]  levothyroxine (SYNTHROID, LEVOTHROID) 125 MCG tablet Take 125 mcg by mouth daily.  01/09/16  Yes [provider]  NON FORMULARY Apply 1 application topically daily. [facility] Anti-fungal (Nail)  (3) Refills *added the urea 40% to the anti-fungal (nail) -#1   Yes [provider]  predniSONE (DELTASONE) 2.5 MG tablet Take 1 tablet (2.5 mg total) by mouth daily. Patient taking differently: Take 5 mg by mouth daily. 02/25/18  Yes Regalado, Belkys A, MD  RABEprazole (ACIPHEX) 20 MG tablet Take 2 tablets (40 mg total) by mouth 2 (two) times daily before a meal. 12/03/20 03/03/21 Yes Dahal, Marlowe Aschoff, MD  traMADol (ULTRAM) 50 MG tablet Take 1 tablet (50 mg total) by mouth every 6 (six) hours as needed for up to 7 days. Patient taking differently: Take 50 mg by mouth every 6 (six) hours as needed for severe pain. 12/03/20 12/10/20 Yes Dahal, Marlowe Aschoff, MD  saccharomyces boulardii (FLORASTOR) 250 MG capsule Take 1 capsule (250 mg total) by mouth 2 (two) times daily for 7 days. 12/03/20 12/10/20  Terrilee Croak, MD  trimethoprim (TRIMPEX) 100 MG tablet TAKE 1 TABLET BY MOUTH EVERY DAY Patient not taking: No sig reported 10/11/20   Cleon Gustin, MD  trimethoprim (TRIMPEX) 100 MG tablet  TAKE 1 TABLET BY MOUTH EVERY DAY Patient not taking: No sig reported 10/11/20   Cleon Gustin, MD     Vitals:   12/10/20 0943 12/10/20 1036 12/10/20 1216 12/10/20 1433  BP: 106/65 (!) 95/56 105/72 131/76  Pulse: (!) 132 (!) 110 99 (!) 107  Resp: (!) _0 Temp: 97.7 F (36.5 C)  97.8 F (36.6 C) 98 F (36.7 C)  TempSrc:   Oral Oral  SpO2: 90% 94% 94% 99%  Weight:      Height:       Exam Gen alert, chron ill appearing No rash, cyanosis or gangrene Sclera anicteric, throat clear  No jvd or bruits Chest clear bilat to bases RRR no MRG Abd protuberant, no ascites per se, no hsm, +BS GU normal male MS no joint effusions or deformity Ext 2+ pitting bilat LE from feet to hips Neuro is alert, Ox 3 , nf    Home meds:  - allopurinol 200 / cefdinir 300 bid/ prn flexeril/ prn zyrtec/ synthroid 125 ug qd/ rapeprazole 40 bid/ florastor bid/ trimethoprim 1 qd  - tramadol 50 qid prn/ prednisone 35m qd  - azelastine-fluticasone qd  - prn's/ vitamins/ supplements    UA 12/19/2020 - 5 ket, 30 prot, 11-20 rbc, 11-20 wbc, 0-5 epis     UA 11/29/20  lrg Hb, small LE, neg protein, >50 rbc, 6-10 wbc    UCx no growth,  BCx's - no growth x 2    I/O complete - 5L in and 7 L out = - 2 L    Wt's 102kg admit > today 104.5 kg      Date  Creat  eGFR   2008- 16 0.9- 1.3     2017  1.1- 2.1   2018- 2020 1.3- 2.31 Aug 2020 1.2- 1.4 55-57   11/29/20 2.49  32   12/23/2020 1.81  43   12/08/20 2.07   12/10/20 2.35  31     Abd CT - Urinary Tract: Fullness of the RIGHT renal  pelvis with nephroureteral stent in place showing soft tissue density with distension of the renal pelvis and calices similar to the study acquired on the same date. Nephrolithiasis also unchanged. Perinephric nodularity along the medial upper pole the LEFT kidney measures 6.4 x 2.7 cm which is within 1-2 mm of its previous measurement when measured by this observer on the prior study. Increased in size when compared to the  study of November 30, 2020. Renal cortical scarring on the LEFT with nephrolithiasis in the lower pole unchanged from very recent comparison. Urinary bladder with small amount of gas in the urinary bladder with distal aspect of the stent in place, loop coiled in the urinary bladder.  Assessment/ Plan: 1. AKI on CKD 3a - b/l creat 1.2- 1.4 from oct 2021. Renal function worsening over last few weeks. UA w/ wbc's and rbc's, no sig proteinuria. Imaging shows stable R ureteral stent and stable R collecting system dilatation.  Has new significant leg edema bilat. The RLE DVT would not explain this. He doesn't have any ascites on CT. The chest may be early wet on CXR but not real symptomatic. May have CHF exacerbation , diastolic type and this can effect renal function. Recommend IV lasix 40- 60 mg bid for now. Get urine lytes. Will follow.  2. RP adenopathy / hepatic lesions - c/w poorly diff carcinoma, urothelial, molecular studies pending. Per notes the differential considerations given heart transplantation and immunosuppression would include posttransplant lymphoproliferative disorder/lymphoma. 3. Hyponatremia - hypervolemic, trying IV lasix 4. H/o Hodgkin's lymphoma - remote 5. H/o cardiac transplant - taking prograf 1.5 mg /qd and pred 5 /d      Kelly Splinter  MD 12/10/2020, 2:55 PM  Recent Labs  Lab 12/08/20 0511 12/09/20 0845  WBC 19.3* 17.7*  HGB 13.1 12.9*   Recent Labs  Lab 12/09/20 1334 12/10/20 0449  K 4.6 4.7  BUN 36* 39*  CREATININE 2.18* 2.35*  CALCIUM 9.2 9.1

## 2020-12-10 NOTE — Progress Notes (Signed)
Report received from [place] that patient positive for right lower ext. DVT. MD paged. Orders received.

## 2020-12-10 NOTE — Progress Notes (Addendum)
HEMATOLOGY-ONCOLOGY PROGRESS NOTE  SUBJECTIVE: Continues to have pain.  Palliative care is following and assisting with management of pain medication.  Offers no other complaints today.  REVIEW OF SYSTEMS:   Constitutional: Denies fevers, chills Eyes: Denies blurriness of vision Ears, nose, mouth, throat, and face: Denies mucositis or sore throat Respiratory: Denies cough, dyspnea or wheezes Cardiovascular: Denies palpitation, chest discomfort Gastrointestinal:  Denies nausea, heartburn or change in bowel habits Skin: Denies abnormal skin rashes Lymphatics: Denies new lymphadenopathy or easy bruising Neurological:Denies numbness, tingling or new weaknesses Behavioral/Psych: Mood is stable, no new changes  Extremities: No lower extremity edema All other systems were reviewed with the patient and are negative.  I have reviewed the past medical history, past surgical history, social history and family history with the patient and they are unchanged from previous note.   PHYSICAL EXAMINATION: ECOG PERFORMANCE STATUS: 3 - Symptomatic, >50% confined to bed  Vitals:   12/10/20 0943 12/10/20 1036  BP: 106/65 (!) 95/56  Pulse: (!) 132 (!) 110  Resp: (!) 22 11  Temp: 97.7 F (36.5 C)   SpO2: 90% 94%   Filed Weights   12/22/2020 1321 12/09/20 1148 12/10/20 0610  Weight: 102.1 kg 105.8 kg 104.5 kg    Intake/Output from previous day: 01/09 0701 - 01/10 0700 In: -  Out: 975 [Urine:975]  GENERAL:alert, no distress and comfortable SKIN: skin color, texture, turgor are normal, no rashes or significant lesions EYES: normal, Conjunctiva are pink and non-injected, sclera clear LUNGS: clear to auscultation and percussion with normal breathing effort HEART: regular rate & rhythm and no murmurs and no lower extremity edema ABDOMEN:abdomen soft, non-tender and normal bowel sounds Musculoskeletal:no cyanosis of digits and no clubbing  NEURO: alert & oriented x 3 with fluent speech, no focal  motor/sensory deficits  LABORATORY DATA:  I have reviewed the data as listed CMP Latest Ref Rng & Units 12/10/2020 12/09/2020 12/09/2020  Glucose 70 - 99 mg/dL 94 107(H) 101(H)  BUN 6 - 20 mg/dL 39(H) 36(H) 34(H)  Creatinine 0.61 - 1.24 mg/dL 2.35(H) 2.18(H) 2.10(H)  Sodium 135 - 145 mmol/L 126(L) 127(L) 123(L)  Potassium 3.5 - 5.1 mmol/L 4.7 4.6 4.2  Chloride 98 - 111 mmol/L 95(L) 95(L) 93(L)  CO2 22 - 32 mmol/L 20(L) 21(L) 21(L)  Calcium 8.9 - 10.3 mg/dL 9.1 9.2 8.7(L)  Total Protein 6.5 - 8.1 g/dL 5.2(L) - -  Total Bilirubin 0.3 - 1.2 mg/dL 0.5 - -  Alkaline Phos 38 - 126 U/L 59 - -  AST 15 - 41 U/L 19 - -  ALT 0 - 44 U/L 9 - -    Lab Results  Component Value Date   WBC 17.7 (H) 12/09/2020   HGB 12.9 (L) 12/09/2020   HCT 38.6 (L) 12/09/2020   MCV 96.0 12/09/2020   PLT 463 (H) 12/09/2020   NEUTROABS 16.6 (H) 12/02/2020    CT ABDOMEN PELVIS WO CONTRAST  Result Date: 12/11/2020 CLINICAL DATA:  Cancer of unknown primary in this 60 year old male. EXAM: CT CHEST, ABDOMEN AND PELVIS WITHOUT CONTRAST TECHNIQUE: Multidetector CT imaging of the chest, abdomen and pelvis was performed following the standard protocol without IV contrast. COMPARISON:  CT abdomen and pelvis of the same date. Also with CT of December 30th of 2021 FINDINGS: CT CHEST FINDINGS Cardiovascular: Calcified atheromatous plaque in the thoracic aorta. Heart size is normal following sternotomy for heart transplant by report a [facility]. Three-vessel coronary artery calcification. No pericardial effusion. Calcifications in the LEFT atrium  partially seen on previous imaging are of uncertain significance central pulmonary vasculature also with some signs of calcification. Mediastinum/Nodes: Ovoid structure associated with LEFT thyroid 11 mm similar to previous imaging, cervical spine from 2017. Thyroid as described above. Calcifications in the LEFT supraclavicular region may be within venous structures on the  LEFT. No axillary lymphadenopathy. Surgical clips in the RIGHT axilla and soft tissue thickening over the LEFT pectoralis musculature similar grossly compared to remote MRI evaluation 2018 and surgical clips seen on numerous priors in the RIGHT axilla. No mediastinal lymphadenopathy. No gross hilar lymphadenopathy. Lungs/Pleura: Small RIGHT of pleural effusion and basilar airspace disease. Small nodule in the superior aspect of the RIGHT middle lobe approximately 3 mm on image 57 of series 5. Biapical scarring. Small nodule in the LEFT upper lobe 6 mm (image 51, series 5) airways are patent. Musculoskeletal: Evidence ir sternotomy without significant bony bridging but with sclerotic margins along the sternotomy line similar to studies dating back to August of 2021 with respect to visualized portions on prior abdomen studies no destructive bone finding or acute bone process. CT ABDOMEN PELVIS FINDINGS Hepatobiliary: 1.9 cm lesion in the RIGHT hepatic lobe near the dome of the RIGHT hemi liver does not display attenuation values of simple cyst is unchanged from the study acquired on the same date at another facility. Additionally a 1.4 cm lesion in the RIGHT hepatic lobe on image 23 of series 2 is unchanged. Sludge in the gallbladder. Tiny low-density foci in the LEFT hepatic lobe without change 1 on image 19 of series 2 in the lateral segment measuring 5 mm and another in the medial segment of the LEFT hepatic lobe measuring 12 mm on image 21 of series 2 these areas appears similar dating back to August of 2020, lesions in the RIGHT hepatic lobe are new. Pancreas: Normal pancreatic contour without signs of inflammation. Spleen: Post splenectomy. Adrenals/Urinary Tract: Adrenal glands, normal on the LEFT and obscured on the RIGHT by perinephric and Peri adrenal nodularity. Fullness of the RIGHT renal pelvis with nephroureteral stent in place showing soft tissue density with distension of the renal pelvis and calices  similar to the study acquired on the same date. Nephrolithiasis also unchanged. Perinephric nodularity along the medial upper pole the LEFT kidney measures 6.4 x 2.7 cm which is within 1-2 mm of its previous measurement when measured by this observer on the prior study. Increased in size when compared to the study of November 30, 2020. Renal cortical scarring on the LEFT with nephrolithiasis in the lower pole unchanged from very recent comparison. Urinary bladder with small amount of gas in the urinary bladder with distal aspect of the stent in place, loop coiled in the urinary bladder. Stomach/Bowel: Scattered colonic diverticulosis without acute gastrointestinal process. Normal appendix. Stool and contrast throughout the colon. Vascular/Lymphatic: Calcified atheromatous plaque in the abdominal aorta without aneurysmal dilation. Retroperitoneal adenopathy unchanged from scan earlier the same day, largest lymph nodes behind the inferior vena cava and in the intra-aortocaval groove and in the upper abdomen, largest on image 41 of series 2 measuring 1.9 cm. Smaller lymph nodes along the LEFT periaortic chain. Postoperative changes in the retroperitoneum associated with previous lymphadenectomy No pelvic lymphadenopathy. Reproductive: Prostate with uro lift device deployment bilaterally similar to recent priors. Other: Extensive retroperitoneal fascial thickening/stranding extending into the root of the small bowel mesentery, nodular changes associated with this thickening on the RIGHT outlining the renal fascia this crosses the midline where it is without significant nodularity  but with fascial thickening and stranding, also tracking into the pelvis. Small amount of fluid in the pelvis. Musculoskeletal: Spinal degenerative changes. No destructive bone process or acute bone finding. IMPRESSION: 1. Retroperitoneal adenopathy and perinephric nodularity with dilation of the RIGHT renal pelvis and collecting systems,  filled with what is suspected to represent neoplasm. 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 Differential considerations given heart transplantation and presumed immunosuppression would include posttransplant lymphoproliferative disorder/lymphoma. 2. Dense material in collecting systems may represent a mixture of tumor and or blood and there is profound dilation of collecting systems of the RIGHT kidney. This is unchanged compared to the recent comparison study. 3. Signs of hepatic involvement with new lesions in the RIGHT hepatic lobe. 4. Ascites with small amount of ascites with very subtle infiltration of fat in the omentum not mentioned above, for instance on image 50 of series 2 raising the question of peritoneal involvement as well. This area is in the range of 2-3 mm. 5. Small RIGHT pleural effusion and basilar airspace disease. 6. Postoperative changes about the chest likely related to prior heart transplantation with surgical clips in the RIGHT axilla and thickening and calcification along the anterior pectoralis musculature not changed since 2018. 7. Calcification in the LEFT atrium also about the aortic root and pulmonary artery and to a lesser degree in the RIGHT atrium likely reflects changes of vascular anastomotic sites in the setting of heart transplantation. 8. 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 9. Post splenectomy. 10. Aortic atherosclerosis. 11. Small nodule in the LEFT upper lobe 6 mm airways are patent. These results will be called to the ordering clinician or representative by the Radiologist Assistant, and communication documented in the PACS or [employer]. Aortic Atherosclerosis (ICD10-I70.0). Electronically Signed   By: Zetta Bills M.D.   On: 12/08/2020 17:38   DG Chest 1 View  Result Date:  11/29/2020 CLINICAL DATA:  Hypoxia EXAM: CHEST  1 VIEW COMPARISON:  08/03/2020 FINDINGS: Since the prior examination, there has developed right basilar consolidation, possibly infectious in the appropriate clinical setting. Mild focal infiltrate is also noted within the right apex. Left lung is clear. No pneumothorax or pleural effusion. Median sternotomy has been performed. Cardiac size within normal limits. Multiple healed right rib fractures are noted. Multiple surgical clips are seen within the right axilla. IMPRESSION: Interval development of multifocal pulmonary infiltrate within the right lung, more focal within the right lung base, suspicious for atypical infection in the acute setting. Electronically Signed   By: Fidela Salisbury MD   On: 11/29/2020 22:27   DG Chest 2 View  Result Date: 12/17/2020 CLINICAL DATA:  Bladder cancer.  Follow-up multifocal pneumonia. EXAM: CHEST - 2 VIEW COMPARISON:  11/29/2020 FINDINGS: Prior median sternotomy. Heart is normal size. Patchy right lung airspace disease in the right apex and right lower lung. Minimal left base linear atelectasis or scarring. Overall aeration in the right lung slightly improved. No effusions. IMPRESSION: Patchy right lung airspace disease with slight improvement since prior study. Left base atelectasis or scarring. Electronically Signed   By: Rolm Baptise M.D.   On: 12/02/2020 13:25   CT CHEST WO CONTRAST  Result Date: 12/04/2020 CLINICAL DATA:  Cancer of unknown primary in this 60 year old male. EXAM: CT CHEST, ABDOMEN AND PELVIS WITHOUT CONTRAST TECHNIQUE: Multidetector CT imaging of the chest, abdomen and pelvis was performed following  the standard protocol without IV contrast. COMPARISON:  CT abdomen and pelvis of the same date. Also with CT of December 30th of 2021 FINDINGS: CT CHEST FINDINGS Cardiovascular: Calcified atheromatous plaque in the thoracic aorta. Heart size is normal following sternotomy for heart transplant by report a [name]. Three-vessel coronary artery calcification. No pericardial effusion. Calcifications in the LEFT atrium partially seen on previous imaging are of uncertain significance central pulmonary vasculature also with some signs of calcification. Mediastinum/Nodes: Ovoid structure associated with LEFT thyroid 11 mm similar to previous imaging, cervical spine from 2017. Thyroid as described above. Calcifications in the LEFT supraclavicular region may be within venous structures on the LEFT. No axillary lymphadenopathy. Surgical clips in the RIGHT axilla and soft tissue thickening over the LEFT pectoralis musculature similar grossly compared to remote MRI evaluation 2018 and surgical clips seen on numerous priors in the RIGHT axilla. No mediastinal lymphadenopathy. No gross hilar lymphadenopathy. Lungs/Pleura: Small RIGHT of pleural effusion and basilar airspace disease. Small nodule in the superior aspect of the RIGHT middle lobe approximately 3 mm on image 57 of series 5. Biapical scarring. Small nodule in the LEFT upper lobe 6 mm (image 51, series 5) airways are patent. Musculoskeletal: Evidence ir sternotomy without significant bony bridging but with sclerotic margins along the sternotomy line similar to studies dating back to August of 2021 with respect to visualized portions on prior abdomen studies no destructive bone finding or acute bone process. CT ABDOMEN PELVIS FINDINGS Hepatobiliary: 1.9 cm lesion in the RIGHT hepatic lobe near the dome of the RIGHT hemi liver does not display attenuation values of simple cyst is unchanged from the study acquired on the same date at another facility. Additionally a 1.4 cm lesion in the RIGHT hepatic lobe on image 23 of series 2 is unchanged. Sludge in the gallbladder. Tiny low-density foci in the LEFT hepatic lobe without change 1 on image 19 of series 2 in the lateral segment measuring 5 mm and another in the medial segment of the LEFT hepatic lobe  measuring 12 mm on image 21 of series 2 these areas appears similar dating back to August of 2020, lesions in the RIGHT hepatic lobe are new. Pancreas: Normal pancreatic contour without signs of inflammation. Spleen: Post splenectomy. Adrenals/Urinary Tract: Adrenal glands, normal on the LEFT and obscured on the RIGHT by perinephric and Peri adrenal nodularity. Fullness of the RIGHT renal pelvis with nephroureteral stent in place showing soft tissue density with distension of the renal pelvis and calices similar to the study acquired on the same date. Nephrolithiasis also unchanged. Perinephric nodularity along the medial upper pole the LEFT kidney measures 6.4 x 2.7 cm which is within 1-2 mm of its previous measurement when measured by this observer on the prior study. Increased in size when compared to the study of November 30, 2020. Renal cortical scarring on the LEFT with nephrolithiasis in the lower pole unchanged from very recent comparison. Urinary bladder with small amount of gas in the urinary bladder with distal aspect of the stent in place, loop coiled in the urinary bladder. Stomach/Bowel: Scattered colonic diverticulosis without acute gastrointestinal process. Normal appendix. Stool and contrast throughout the colon. Vascular/Lymphatic: Calcified atheromatous plaque in the abdominal aorta without aneurysmal dilation. Retroperitoneal adenopathy unchanged from scan earlier the same day, largest lymph nodes behind the inferior vena cava and in the intra-aortocaval groove and in the upper abdomen, largest on image 41 of series 2 measuring 1.9 cm. Smaller lymph nodes along the  LEFT periaortic chain. Postoperative changes in the retroperitoneum associated with previous lymphadenectomy No pelvic lymphadenopathy. Reproductive: Prostate with uro lift device deployment bilaterally similar to recent priors. Other: Extensive retroperitoneal fascial thickening/stranding extending into the root of the small bowel  mesentery, nodular changes associated with this thickening on the RIGHT outlining the renal fascia this crosses the midline where it is without significant nodularity but with fascial thickening and stranding, also tracking into the pelvis. Small amount of fluid in the pelvis. Musculoskeletal: Spinal degenerative changes. No destructive bone process or acute bone finding. IMPRESSION: 1. Retroperitoneal adenopathy and perinephric nodularity with dilation of the RIGHT renal pelvis and collecting systems, filled with what is suspected to represent neoplasm. 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 Differential considerations given heart transplantation and presumed immunosuppression would include posttransplant lymphoproliferative disorder/lymphoma. 2. Dense material in collecting systems may represent a mixture of tumor and or blood and there is profound dilation of collecting systems of the RIGHT kidney. This is unchanged compared to the recent comparison study. 3. Signs of hepatic involvement with new lesions in the RIGHT hepatic lobe. 4. Ascites with small amount of ascites with very subtle infiltration of fat in the omentum not mentioned above, for instance on image 50 of series 2 raising the question of peritoneal involvement as well. This area is in the range of 2-3 mm. 5. Small RIGHT pleural effusion and basilar airspace disease. 6. Postoperative changes about the chest likely related to prior heart transplantation with surgical clips in the RIGHT axilla and thickening and calcification along the anterior pectoralis musculature not changed since 2018. 7. Calcification in the LEFT atrium also about the aortic root and pulmonary artery and to a lesser degree in the RIGHT atrium likely reflects changes of vascular anastomotic sites in the setting of heart transplantation. 8. 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 9. Post splenectomy. 10. Aortic atherosclerosis. 11. Small nodule in the LEFT upper lobe 6 mm airways are patent. These results will be called to the ordering clinician or representative by the Radiologist Assistant, and communication documented in the PACS or [employer]. Aortic Atherosclerosis (ICD10-I70.0). Electronically Signed   By: Zetta Bills M.D.   On: 12/25/2020 17:38   NM Pulmonary Perfusion  Result Date: 12/15/2020 CLINICAL DATA:  Respiratory failure EXAM: NUCLEAR MEDICINE PERFUSION LUNG SCAN TECHNIQUE: Perfusion images were obtained in multiple projections after intravenous injection of radiopharmaceutical. Ventilation scans intentionally deferred if perfusion scan and chest x-ray adequate for interpretation during COVID 19 epidemic. RADIOPHARMACEUTICALS:  4.1 mCi Tc-64m MAA IV COMPARISON:  Chest x-ray 12/29/2020 FINDINGS: Slightly heterogeneous distribution of radiotracer within the lung fields. Multiple small bilateral wedge-shaped perfusion defects. No corresponding radiographic abnormality. No large mismatched segmental perfusion defect. IMPRESSION: Intermediate probability for pulmonary embolism. Electronically Signed   By: Davina Poke D.O.   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 ANESTHESIA/SEDATION: Fentanyl 100 mcg IV; Versed 4 mg IV Sedation time: 14 minutes; The patient was continuously monitored during the procedure by the interventional radiology nurse under my direct supervision. CONTRAST:  None. COMPLICATIONS: SIR Level A - No therapy, no consequence. Retroperitoneal hemorrhage at site of biopsy, controlled with Gel-Foam slurry administration along needle track.  PROCEDURE: Informed  consent was obtained from the patient following an explanation of the procedure, risks, benefits and alternatives. A time out was performed prior to the initiation of the procedure. The patient was positioned in a partial left lateral decubitus position on the CT table and a limited CT was performed for procedural planning demonstrating similar appearing multifocal right retroperitoneal lymphadenopathy. The procedure was planned. The operative site was prepped and draped in the usual sterile fashion. Appropriate trajectory was confirmed with a 22 gauge spinal needle after the adjacent tissues were anesthetized with 1% Lidocaine with epinephrine. Under intermittent CT guidance, a 17 gauge coaxial needle was advanced into the peripheral aspect of the mass. Appropriate positioning was confirmed and a total of 4 samples were obtained with an 18 gauge core needle biopsy device. A limited CT demonstrated focal hemorrhage about the biopsied lymph node. Gel-Foam slurry was injected through the introducer needle along the needle track. The co-axial needle was removed and hemostasis was achieved with manual compression. Additional limited postprocedural CT was negative for expanding hemorrhage or additional complication. A dressing was placed. The patient tolerated the procedure well without immediate postprocedural complication. IMPRESSION: Technically successful CT guided core needle biopsy of right retroperitoneal lymph node. Ruthann Cancer, MD [facility] Electronically Signed   By: Ruthann Cancer MD   On: 12/07/2020 08:53   DG CHEST PORT 1 VIEW  Result Date: 12/10/2020 CLINICAL DATA:  Hypoxia.  History of cardiac transplant EXAM: PORTABLE CHEST 1 VIEW COMPARISON:  12/03/2020 FINDINGS: Prior median sternotomy. Heart size within normal limits. Atherosclerotic calcification of the aortic knob. Subtle right lower lobe opacity, slightly improved  from prior. Trace right pleural effusion. No pneumothorax. IMPRESSION: 1. Subtle right lower lobe opacity, slightly improved from prior. 2. Trace right pleural effusion. Electronically Signed   By: Davina Poke D.O.   On: 12/10/2020 10:33   DG C-Arm 1-60 Min-No Report  Result Date: 11/29/2020 Fluoroscopy was utilized by the requesting physician.  No radiographic interpretation.   ECHOCARDIOGRAM COMPLETE  Result Date: 11/30/2020    ECHOCARDIOGRAM REPORT   Patient Name:   MAXIMINO COZZOLINO Loiseau Date of Exam: 11/30/2020 Medical Rec #:  [id number]         Height:       74.0 in Accession #:    [id number]        Weight:       221.0 lb Date of Birth:  1961/02/08        BSA:          2.268 m Patient Age:    60 years          BP:           103/59 mmHg Patient Gender: M                 HR:           94 bpm. Exam Location:  Inpatient Procedure: 2D Echo, Cardiac Doppler, Color Doppler and Intracardiac            Opacification Agent Indications:    Dyspnea R06.00  History:        Patient has prior history of Echocardiogram examinations, most                 recent 05/12/2017. Arrythmias:Atrial Fibrillation; Risk                 Factors:Non-Smoker. GERD. 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  Sonographer:    Vickie Epley RDCS Referring Phys: [id number]  BRADLEY S CHOTINER  Sonographer Comments: Suboptimal apical window and suboptimal subcostal window. Pt unable to turn on side due to herniated discs. IMPRESSIONS  1. Left ventricular ejection fraction, by estimation, is 60 to 65%. The left ventricle has normal function. The left ventricle has no regional wall motion abnormalities. Left ventricular diastolic parameters were normal.  2. Right ventricular systolic function is normal. The right ventricular size is normal. There is normal pulmonary artery systolic pressure. The estimated right ventricular systolic pressure is 16.1 mmHg.  3. The mitral valve is normal in structure. No evidence of mitral valve regurgitation. No evidence of  mitral stenosis.  4. The aortic valve is normal in structure. Aortic valve regurgitation is not visualized. No aortic stenosis is present.  5. The inferior vena cava is normal in size with greater than 50% respiratory variability, suggesting right atrial pressure of 3 mmHg. FINDINGS  Left Ventricle: Left ventricular ejection fraction, by estimation, is 60 to 65%. The left ventricle has normal function. The left ventricle has no regional wall motion abnormalities. Definity contrast agent was given IV to delineate the left ventricular  endocardial borders. The left ventricular internal cavity size was normal in size. There is no left ventricular hypertrophy. Left ventricular diastolic parameters were normal. Normal left ventricular filling pressure. Right Ventricle: The right ventricular size is normal. No increase in right ventricular wall thickness. Right ventricular systolic function is normal. There is normal pulmonary artery systolic pressure. The tricuspid regurgitant velocity is 2.39 m/s, and  with an assumed right atrial pressure of 3 mmHg, the estimated right ventricular systolic pressure is 09.6 mmHg. Left Atrium: Left atrial size was normal in size. Right Atrium: Right atrial size was normal in size. Pericardium: There is no evidence of pericardial effusion. Mitral Valve: The mitral valve is normal in structure. No evidence of mitral valve regurgitation. No evidence of mitral valve stenosis. Tricuspid Valve: The tricuspid valve is normal in structure. Tricuspid valve regurgitation is mild . No evidence of tricuspid stenosis. Aortic Valve: The aortic valve is normal in structure. Aortic valve regurgitation is not visualized. No aortic stenosis is present. Pulmonic Valve: The pulmonic valve was normal in structure. Pulmonic valve regurgitation is not visualized. No evidence of pulmonic stenosis. Aorta: The aortic root is normal in size and structure. Venous: The inferior vena cava is normal in size with  greater than 50% respiratory variability, suggesting right atrial pressure of 3 mmHg. IAS/Shunts: No atrial level shunt detected by color flow Doppler.  LEFT VENTRICLE PLAX 2D LVIDd:         4.40 cm     Diastology LVIDs:         3.20 cm     LV e' medial:    10.00 cm/s LV PW:         0.90 cm     LV E/e' medial:  7.5 LV IVS:        0.90 cm     LV e' lateral:   11.30 cm/s LVOT diam:     2.50 cm     LV E/e' lateral: 6.7 LV SV:         48 LV SV Index:   21 LVOT Area:     4.91 cm  LV Volumes (MOD) LV vol d, MOD A4C: 80.2 ml LV vol s, MOD A4C: 30.4 ml LV SV MOD A4C:     80.2 ml LEFT ATRIUM           Index LA diam:  2.70 cm 1.19 cm/m LA Vol (A4C): 16.3 ml 7.19 ml/m  AORTIC VALVE LVOT Vmax:   50.20 cm/s LVOT Vmean:  36.700 cm/s LVOT VTI:    0.099 m  AORTA Ao Root diam: 3.40 cm MITRAL VALVE               TRICUSPID VALVE MV Area (PHT): 5.38 cm    TR Peak grad:   22.8 mmHg MV Decel Time: 141 msec    TR Vmax:        239.00 cm/s MV E velocity: 75.40 cm/s MV A velocity: 36.00 cm/s  SHUNTS MV E/A ratio:  2.09        Systemic VTI:  0.10 m                            Systemic Diam: 2.50 cm Dani Gobble Croitoru MD Electronically signed by Sanda Klein MD Signature Date/Time: 11/30/2020/12:30:40 PM    Final    VAS [place] LOWER EXTREMITY VENOUS (DVT)  Result Date: 12/03/2020  Lower Venous DVT Study Indications: Pain.  Comparison Study: Previous 12/2019 Performing Technologist: Vonzell Schlatter RVT  Examination Guidelines: A complete evaluation includes B-mode imaging, spectral Doppler, color Doppler, and power Doppler as needed of all accessible portions of each vessel. Bilateral testing is considered an integral part of a complete examination. Limited examinations for reoccurring indications may be performed as noted. The reflux portion of the exam is performed with the patient in reverse Trendelenburg.  +---------+---------------+---------+-----------+----------+--------------+ RIGHT     CompressibilityPhasicitySpontaneityPropertiesThrombus Aging +---------+---------------+---------+-----------+----------+--------------+ CFV      Full           Yes      Yes                                 +---------+---------------+---------+-----------+----------+--------------+ SFJ      Full                                                        +---------+---------------+---------+-----------+----------+--------------+ FV Prox  Full                                                        +---------+---------------+---------+-----------+----------+--------------+ FV Mid   Full                                                        +---------+---------------+---------+-----------+----------+--------------+ FV DistalFull                                                        +---------+---------------+---------+-----------+----------+--------------+ PFV      Full                                                        +---------+---------------+---------+-----------+----------+--------------+  POP      Full           Yes      Yes                                 +---------+---------------+---------+-----------+----------+--------------+ PTV      Full                                                        +---------+---------------+---------+-----------+----------+--------------+ PERO     Full                                                        +---------+---------------+---------+-----------+----------+--------------+   +---------+---------------+---------+-----------+----------+--------------+ LEFT     CompressibilityPhasicitySpontaneityPropertiesThrombus Aging +---------+---------------+---------+-----------+----------+--------------+ CFV      Full           Yes      Yes                                 +---------+---------------+---------+-----------+----------+--------------+ SFJ      Full                                                         +---------+---------------+---------+-----------+----------+--------------+ FV Prox  Full                                                        +---------+---------------+---------+-----------+----------+--------------+ FV Mid   Full                                                        +---------+---------------+---------+-----------+----------+--------------+ FV DistalFull                                                        +---------+---------------+---------+-----------+----------+--------------+ PFV      Full                                                        +---------+---------------+---------+-----------+----------+--------------+ POP      Full           Yes      Yes                                 +---------+---------------+---------+-----------+----------+--------------+  PTV      Full                                                        +---------+---------------+---------+-----------+----------+--------------+ PERO     Full                                                        +---------+---------------+---------+-----------+----------+--------------+     Summary: RIGHT: - There is no evidence of deep vein thrombosis in the lower extremity.  - No cystic structure found in the popliteal fossa.  LEFT: - There is no evidence of deep vein thrombosis in the lower extremity.  - No cystic structure found in the popliteal fossa.  *See table(s) above for measurements and observations. Electronically signed by Ruta Hinds MD on 12/03/2020 at 12:15:41 PM.    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  Molecular studies pending. -12/23/2020 CT chest/abdomen/pelvis without contrast showed "1. Retroperitoneal adenopathy and perinephric nodularity with dilation of the RIGHT renal pelvis and collecting  systems, filled with what is suspected to represent neoplasm. 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 Differential considerations given heart transplantation and presumed immunosuppression would include posttransplant lymphoproliferative disorder/lymphoma. 2. Dense material in collecting systems may represent a mixture of tumor and or blood and there is profound dilation of collecting systems of the RIGHT kidney. This is unchanged compared to the recent comparison study. 3. Signs of hepatic involvement with new lesions in the RIGHT hepatic lobe. 4. Ascites with small amount of ascites with very subtle infiltration of fat in the omentum not mentioned above, for instance on image 50 of series 2 raising the question of peritoneal involvement as well. This area is in the range of 2-3 mm. 5. Small RIGHT pleural effusion and basilar airspace disease. 6. Postoperative changes about the chest likely related to prior heart transplantation with surgical clips in the RIGHT axilla and thickening and calcification along the anterior pectoralis musculature not changed since 2018. 7. Calcification in the LEFT atrium also about the aortic root and pulmonary artery and to a lesser degree in the RIGHT atrium likely reflects changes of vascular anastomotic sites in the setting of heart transplantation. 8. 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 9. Post splenectomy. 10. Aortic atherosclerosis. 11. Small nodule in the LEFT upper lobe 6 mm airways are patent."  2) Back pain secondary to #1  3) Hyponatremia  4) Leukocytosis and Thrombocytosis  5) CKD  6) High-grade T1 bladder cancer  7) History of Hodgkin's lymphoma  8) status post heart transplant  PLAN: -Biopsy has resulted and results of been discussed with the patient.  Biopsy  showed poorly differentiated carcinoma and based on pathology, differential includes poorly differentiated urothelial carcinoma.  We will send for molecular testing. -Discussed treatment options with the patient including systemic chemotherapy.  Typically cisplatin and gemcitabine would be used but given renal insufficiency, cannot use cisplatin.  Would instead consider  him for carboplatin and gemcitabine. -We will check PD-L1 status but may be difficult to give immunotherapy to a patient on immunosuppressants due to prior heart transplant.  We will reach out to his transplant team at [facility] for other discussion.  Final recommendations regarding treatment per Dr. Irene Limbo.  -Management of pain per palliative care team.   LOS: 3 days   Mikey Bussing, DNP, AGPCNP-BC, AOCNP 12/10/20    ADDENDUM  Patient was Personally and independently interviewed, examined and relevant elements of the history of present illness were reviewed in details and an assessment and plan was created. All elements of the patient's history of present illness , assessment and plan were discussed in details with Mikey Bussing, DNP, AGPCNP-BC, AOCNP. The above documentation reflects our combined findings assessment and plan.  I discussed the results of the biopsy and imaging studies in detail with the patient. Imaging and pathology results consistent with metastatic urothelial carcinoma likely arising from the right renal pelvis with mets to abdominal/retroperitoneal lymph nodes and liver. Plan -We discussed that this is not a curable condition. -His chronic immunosuppression for his cardiac transplantation status he has an additional complicating factor that might limit immunotherapy options. -We discussed that in the right setting the standard treatment would be cisplatin gemcitabine followed by avelumab maintenance.  He is not a good candidate for cisplatin given his renal function at this time and immunotherapy may not be an  option given his need for chronic immunosuppression and risk for cardiac rejection with immunotherapy and the lack of its efficacy with T-cell immune suppression. -We discussed the possibility of using carboplatin/gemcitabine for palliative chemotherapy.  Patient is hesitant to commit to this and wanted some time to talk to his family and think about whether he would want to pursue palliative chemotherapy.  Especially given his significant risk of infections, limited renal reserve and heavy previous treatment with chemotherapy for Hodgkin's lymphoma. -PD-L1 and foundation 1 has been requested on his tissue sample. -He was given the option of being referred to [facility] for additional input given his complex situation and to evaluate for possibility of clinical trials.  He is not keen to go to [facility] in person and declined a direct transfer. -He was open to the idea of possibly having a telephone consultation with [facility] medical oncology if that is possible.  I will place a referral to Dr. Lawanna Kobus to see if an initial telephone consultation is possible. -Palliative care following for pain management and goals of care discussion.   Sullivan Lone MD MS

## 2020-12-11 ENCOUNTER — Other Ambulatory Visit: Payer: Self-pay | Admitting: Hematology

## 2020-12-11 DIAGNOSIS — C7910 Secondary malignant neoplasm of unspecified urinary organs: Secondary | ICD-10-CM

## 2020-12-11 DIAGNOSIS — R590 Localized enlarged lymph nodes: Secondary | ICD-10-CM | POA: Diagnosis not present

## 2020-12-11 DIAGNOSIS — G893 Neoplasm related pain (acute) (chronic): Secondary | ICD-10-CM | POA: Diagnosis not present

## 2020-12-11 DIAGNOSIS — K5903 Drug induced constipation: Secondary | ICD-10-CM

## 2020-12-11 DIAGNOSIS — I5022 Chronic systolic (congestive) heart failure: Secondary | ICD-10-CM | POA: Diagnosis not present

## 2020-12-11 DIAGNOSIS — R58 Hemorrhage, not elsewhere classified: Secondary | ICD-10-CM

## 2020-12-11 DIAGNOSIS — C801 Malignant (primary) neoplasm, unspecified: Secondary | ICD-10-CM | POA: Diagnosis not present

## 2020-12-11 DIAGNOSIS — N179 Acute kidney failure, unspecified: Secondary | ICD-10-CM | POA: Diagnosis not present

## 2020-12-11 DIAGNOSIS — M545 Low back pain, unspecified: Secondary | ICD-10-CM | POA: Diagnosis not present

## 2020-12-11 DIAGNOSIS — I82441 Acute embolism and thrombosis of right tibial vein: Secondary | ICD-10-CM | POA: Diagnosis not present

## 2020-12-11 DIAGNOSIS — T402X5A Adverse effect of other opioids, initial encounter: Secondary | ICD-10-CM

## 2020-12-11 LAB — COMPREHENSIVE METABOLIC PANEL
ALT: 10 U/L (ref 0–44)
AST: 19 U/L (ref 15–41)
Albumin: 2.5 g/dL — ABNORMAL LOW (ref 3.5–5.0)
Alkaline Phosphatase: 60 U/L (ref 38–126)
Anion gap: 11 (ref 5–15)
BUN: 47 mg/dL — ABNORMAL HIGH (ref 6–20)
CO2: 22 mmol/L (ref 22–32)
Calcium: 9.6 mg/dL (ref 8.9–10.3)
Chloride: 93 mmol/L — ABNORMAL LOW (ref 98–111)
Creatinine, Ser: 2.84 mg/dL — ABNORMAL HIGH (ref 0.61–1.24)
GFR, Estimated: 25 mL/min — ABNORMAL LOW (ref >60)
Glucose, Bld: 105 mg/dL — ABNORMAL HIGH (ref 70–99)
Potassium: 4.6 mmol/L (ref 3.5–5.1)
Sodium: 126 mmol/L — ABNORMAL LOW (ref 135–145)
Total Bilirubin: 0.5 mg/dL (ref 0.3–1.2)
Total Protein: 5.5 g/dL — ABNORMAL LOW (ref 6.5–8.1)

## 2020-12-11 LAB — CBC
HCT: 38.1 % — ABNORMAL LOW (ref 39.0–52.0)
Hemoglobin: 13 g/dL (ref 13.0–17.0)
MCH: 32.1 pg (ref 26.0–34.0)
MCHC: 34.1 g/dL (ref 30.0–36.0)
MCV: 94.1 fL (ref 80.0–100.0)
Platelets: 404 10*3/uL — ABNORMAL HIGH (ref 150–400)
RBC: 4.05 MIL/uL — ABNORMAL LOW (ref 4.22–5.81)
RDW: 15.5 % (ref 11.5–15.5)
WBC: 20.7 10*3/uL — ABNORMAL HIGH (ref 4.0–10.5)
nRBC: 0 % (ref 0.0–0.2)

## 2020-12-11 LAB — HEPARIN LEVEL (UNFRACTIONATED)
Heparin Unfractionated: 0.56 IU/mL (ref 0.30–0.70)
Heparin Unfractionated: 0.51 IU/mL (ref 0.30–0.70)

## 2020-12-11 MED ORDER — LACTULOSE 10 GM/15ML PO SOLN
10.0000 g | Freq: Two times a day (BID) | ORAL | Status: DC
  Administered 2020-12-11 – 2020-12-23 (×21): 10 g via ORAL
  Filled 2020-12-11 (×22): qty 15

## 2020-12-11 NOTE — Progress Notes (Signed)
Daily Progress Note   Glenn Gonzalez Name: Glenn Gonzalez       Date: 12/11/2020 DOB: 1961-09-01  Age: 60 y.o. MRN#: [id number] Attending Physician: Desiree Hane, MD Primary Care Physician: Lajean Manes, MD Admit Date: 12/04/2020  Reason for Consultation/Follow-up: Establishing goals of care and Pain control  Subjective: Chart reviewed and discussed case with Dr. Lonny Prude.  We discussed ongoing issues with pain management and constipation.  I saw and examined Glenn Gonzalez today.  His wife was also at the bedside.  We discussed current pain management.  He reports that whenever he is lying still, his pain is well controlled with current dose of 10 mg of OxyContin twice daily.  He does have incident pain that shoots up significantly whenever he tries to move or stand up.  He reports it is tolerable whenever he has rescue medication, but he is still not comfortable with any movements.  MAR reveals his last rescue medications were dose of Dilaudid yesterday morning and a dose of oxycodone in the afternoon.  I encouraged him to continue to use rescue medication as needed and reminded him that he has more available on an as-needed basis if his pain is poorly controlled.  We did briefly discuss incident pain and why this can be difficult pain to control as pain occurs with activity and increasing long-acting agent may cause increased sleepiness or hypotension.  We also discussed constipation.  Dr. Lonny Prude has already scheduled lactulose.  We will follow.  Length of Stay: 4  Current Medications: Scheduled Meds:  . allopurinol  200 mg Oral Daily  . azelastine  1 spray Each Nare Daily   And  . fluticasone  1 spray Each Nare Daily  . docusate sodium  100 mg Oral BID  . lactulose  10 g Oral BID  .  levothyroxine  125 mcg Oral Daily  . loratadine  10 mg Oral Daily  . metoCLOPramide  5 mg Oral TID AC & HS  . oxyCODONE  10 mg Oral Q12H  . pantoprazole  40 mg Oral BID  . predniSONE  5 mg Oral Daily  . senna-docusate  2 tablet Oral BID  . sucralfate  1 g Oral BID AC & HS  . Tacrolimus ER  1.5 mg Oral Daily  . traZODone  100 mg Oral QHS  Continuous Infusions: . heparin 1,750 Units/hr (12/11/20 0727)    PRN Meds: acetaminophen **OR** acetaminophen, calcium carbonate, Glycerin (Adult), HYDROmorphone (DILAUDID) injection, magnesium hydroxide, ondansetron **OR** ondansetron (ZOFRAN) IV, oxyCODONE  Physical Exam        General: Alert, awake, in no acute distress.  HEENT: No JVD Lungs: Good air movement, no wheezing Abdomen:  Nondistended Ext: + LE edema Skin: Warm and dry Neuro: Grossly intact, nonfocal.   Vital Signs: BP 113/65 (BP Location: Left Arm)   Pulse 96   Temp 98.4 F (36.9 C) (Oral)   Resp 12   Ht 6\' 2"  (1.88 m)   Wt 109.6 kg   SpO2 99%   BMI 31.02 kg/m  SpO2: SpO2: 99 % O2 Device: O2 Device: Room Air O2 Flow Rate: O2 Flow Rate (L/min): 1 L/min  Intake/output summary:   Intake/Output Summary (Last 24 hours) at 12/11/2020 1511 Last data filed at 12/11/2020 1400 Gross per 24 hour  Intake 550.49 ml  Output 1200 ml  Net -649.51 ml   LBM: Last BM Date: 12/09/20 Baseline Weight: Weight: 102.1 kg Most recent weight: Weight: 109.6 kg       Palliative Assessment/Data:      Glenn Gonzalez Active Problem List   Diagnosis Date Noted  . Acute on chronic combined systolic and diastolic CHF (congestive heart failure) ([place]) 12/10/2020  . Acute DVT of right tibial vein ([place]) 12/10/2020  . Retroperitoneal lymphadenopathy   . Liver lesion   . Back pain 12/10/2020  . Multifocal pneumonia 11/29/2020  . Tachycardia 11/29/2020  . Hypoxia 11/29/2020  . Hyponatremia 11/29/2020  . Sepsis ([place]) 11/29/2020  . Hydronephrosis 11/29/2020  . Iron deficiency anemia due to  chronic blood loss 07/21/2019  . Onychomycosis 11/08/2018  . Sepsis due to gram-negative UTI ([place]) 02/22/2018  . Status post heart transplantation ([place]) 02/22/2018  . Acute kidney injury superimposed on CKD ([place]) 02/22/2018  . Hypothyroidism 02/22/2018  . Atrial fibrillation ([place]) [I48.91] 05/15/2016  . S/P lumbar laminectomy 01/04/2016  . Palpitations 04/11/2015  . Dyspnea 01/02/2015  . Atrial tachycardia ([place]) 09/19/2014  . Fatigue 08/10/2014  . Hypotension 10/23/2013  . Insomnia 08/04/2013  . Chronotropic incompetence 05/31/2013  . Biventricular implantable cardioverter-defibrillator BSx 05/31/2013  . Nonischemic cardiomyopathy ([place]) 10/10/2011  . SHORTNESS OF BREATH 06/20/2010  . VENTRICULAR TACHYCARDIA 06/04/2010  . SYSTOLIC HEART FAILURE, CHRONIC 06/04/2010  . DEPRESSION 03/15/2008  . VERTIGO, BENIGN PAROXYSMAL POSITION 09/23/2007  . NEOPLASM, BENIGN, SKIN, TRUNK 08/26/2007  . VIRAL URI 08/14/2007    Palliative Care Assessment & Plan   Glenn Gonzalez: 60 year old Gonzalez with medical history of cardiac transplant, combined heart failure, GERD, high-grade T1 bladder cancer with AKI complicated by hydronephrosis in setting of new retroperitoneal lymphadenopathy with biopsy showing poorly differentiated carcinoma  Recommendations/Plan:  Pain, cancer related: Continue current interventions.  He is currently on OxyContin 10 mg twice daily for long-acting agent.  He is also taking as needed Dilaudid 1 mg IV or oxycodone 10 mg every 4 hours as needed.  We will continue to monitor usage and increase long-acting if appropriate.  A lot of his pain is being driven by incident pain and discussed with him premedicated prior to activities that cause pain.  He has been doing this and while pain is not completely relieved, it has improved his ability to participate in some daily activities.  We will follow and continue to consider up titration of pain medication based upon his clinical  course.  Constipation, opioid related: Agree with scheduled lactulose.  Continue senna.  Goals of Care and Additional Recommendations:  Limitations on Scope of Treatment: Full Scope Treatment  Code Status:    Code Status Orders  (From admission, onward)         Start     Ordered   12/14/2020 1713  Full code  Continuous        12/16/2020 1716        Code Status History    Date Active Date Inactive Code Status Order ID Comments User Context   11/30/2020 0140 12/03/2020 1851 Full Code [id number]  Chotiner, Yevonne Aline, MD Inpatient   02/22/2018 1459 02/24/2018 1704 Full Code [id number]  Karmen Bongo, MD ED   04/20/2015 1145 04/20/2015 1813 Full Code [id number]  Bensimhon, Shaune Pascal, MD Inpatient   Advance Care Planning Activity       Prognosis:   Guarded  Discharge Planning:  To Be Determined  Care plan was discussed with Glenn Gonzalez, wife, Dr. Teryl Lucy  Thank you for allowing the Palliative Medicine Team to assist in the care of this Glenn Gonzalez.   Time In: 1150 Time Out: 1215 Total Time 25 Prolonged Time Billed No      Greater than 50%  of this time was spent counseling and coordinating care related to the above assessment and plan.  Micheline Rough, MD  Please contact Palliative Medicine Team phone at [phone] for questions and concerns.

## 2020-12-11 NOTE — Progress Notes (Signed)
Pharmacy - IV heparin  Assessment:    Please see note from Dia Sitter, PharmD earlier today for full details.  Briefly, 60 y.o. male on IV heparin for RLE DVT. PMH notable for hematuria while recently on heparin, and now with retroperitoneal hemorrhage following RP node biopsy on 1/6.    Heparin level therapeutic (goal 0.3-0.7) now x 2 on IV heparin rate of 1750 units/hr  No bleeding or infusion issues per RN  Hx cardiac transplant  Plan:   Continue heparin at current rate of 1750 units/hr; no bolus  Daily CBC and heparin level    Adrian Saran, PharmD, BCPS 12/11/2020 1:42 PM

## 2020-12-11 NOTE — Progress Notes (Signed)
[facility] Progress Note  Subjective: creat up 2.8, 1.0 L out yesterday.   Vitals:   12/10/20 1804 12/10/20 2205 12/11/20 0228 12/11/20 0614  BP: 109/74 108/71 104/68 115/74  Pulse: (!) 109 100 90 89  Resp: _0 Temp: 98.7 F (37.1 C) 97.9 F (36.6 C) 98.1 F (36.7 C) 97.7 F (36.5 C)  TempSrc: Oral Oral Oral Oral  SpO2: 94% 93% 93% 94%  Weight: 109.6 kg     Height: _1  (1.88 m)       Exam: Gen alert, chron ill appearing No jvd or bruits Chest clear bilat to bases RRR no MRG Abd protuberant, no ascites by exam, no hsm, +BS Ext 2+ pitting bilat LE from feet to hips Neuro is alert, Ox 3 , nf    Home meds:  - allopurinol 200 / cefdinir 300 bid/ prn flexeril/ prn zyrtec/ synthroid 125 ug qd/ rapeprazole 40 bid/ florastor bid/ trimethoprim 1 qd  - tramadol 50 qid prn/ prednisone 1m qd  - azelastine-fluticasone qd  - prn's/ vitamins/ supplements    UA 12/10/2020 - 5 ket, 30 prot, 11-20 rbc, 11-20 wbc, 0-5 epis     UA 11/29/20  lrg Hb, small LE, neg protein, >50 rbc, 6-10 wbc    UCx no growth,  BCx's - no growth x 2    I/O complete - 5L in and 7 L out = - 2 L    Wt's 102kg admit > today 104.5 kg      Date              Creat               eGFR   2008- 16        0.9- 1.3               2017              1.1- 2.1   2018- 2020    1.3- 2.31 Aug 2020       1.2- 1.4            55-57   11/29/20        2.49                 32   12/08/2020            1.81                 43   12/08/20            2.07   12/10/20          2.35                 31     Abd CT - Urinary Tract: Fullness of the RIGHT renal pelvis with nephroureteral stent in place showing soft tissue density with distension of the renal pelvis and calices similar to the study acquired on the same date. Nephrolithiasis also unchanged. Perinephric nodularity along the medial upper pole the LEFT kidney measures 6.4 x 2.7 cm which is within 1-2 mm of its previous measurement when measured by this  observer on the prior study. Increased in size when compared to the study of November 30, 2020. Renal cortical scarring on the LEFT with nephrolithiasis in the lower pole unchanged from very recent comparison. Urinary bladder with small amount of gas in the urinary bladder with distal aspect of the stent in  place, loop coiled in the urinary bladder.  Assessment/ Plan: 1. AKI on CKD 3a - b/l creat 1.2- 1.4 from oct 2021. Renal function worsening over last few weeks. UA +wbc's and rbc's, no sig proteinuria. Imaging shows stable R ureteral stent and stable R collecting system dilatation.  Has new significant leg edema bilaterally. ECHO showed normal EF. Creat 1.8 on admission > up to 2.3 yest and 2.8 today after starting IV lasix yesterday.  Will have to dc lasix due to rising creatinine. Suspect lymphatic obstruction d/t malignancy + low albumin causing his new edema. Do not see a treatable entity here to improve renal function or edema. Will add bilat LE hose.  The active malignancy seems to be the cause of most of his problems.  2. High-grade bladder cancer- recent dx , treated BCG in Aug 2021, sp TURBT x 2 Aug and Sept. New RP adenopathy w/ R hydro in Dec 2021 > urology did cysto and R ureteral stent.  Biopsy of RP mass done last week showed poorly diff urothelial carcinoma.  3. Edema - new bilat sig leg edema, no CHF. Tried IV lasix but creat up so now dc'ing lasix as above.  Poor prognosis.  4. H/o Hodgkin's lymphoma - remote 5. H/o cardiac transplant - taking prograf 1.5 mg /qd and pred 5 /d 6. Acute RLE DVT - per primary team 7. Chronic diast CHF - had prior LVEF 10% but now has corrected to 60-65%.        Rob Ausencio Vaden 12/11/2020, 8:14 AM   Recent Labs  Lab 12/09/20 0845 12/09/20 1334 12/10/20 0449 12/11/20 0604  K 4.2   < > 4.7 4.6  BUN 34*   < > 39* 47*  CREATININE 2.10*   < > 2.35* 2.84*  CALCIUM 8.7*   < > 9.1 9.6  HGB 12.9*  --   --  13.0   < > = values in this interval not  displayed.   Inpatient medications: . allopurinol  200 mg Oral Daily  . azelastine  1 spray Each Nare Daily   And  . fluticasone  1 spray Each Nare Daily  . docusate sodium  100 mg Oral BID  . levothyroxine  125 mcg Oral Daily  . loratadine  10 mg Oral Daily  . metoCLOPramide  5 mg Oral TID AC & HS  . oxyCODONE  10 mg Oral Q12H  . pantoprazole  40 mg Oral BID  . predniSONE  5 mg Oral Daily  . senna-docusate  2 tablet Oral BID  . sucralfate  1 g Oral BID AC & HS  . Tacrolimus ER  1.5 mg Oral Daily  . traZODone  100 mg Oral QHS   . heparin 1,750 Units/hr (12/11/20 0727)   acetaminophen **OR** acetaminophen, calcium carbonate, Glycerin (Adult), HYDROmorphone (DILAUDID) injection, lactulose, magnesium hydroxide, ondansetron **OR** ondansetron (ZOFRAN) IV, oxyCODONE

## 2020-12-11 NOTE — Progress Notes (Signed)
[facility] for heparin Indication: acute DVT  Allergies  Allergen Reactions  . Digoxin Other (See Comments)    gynecomastia   . Phencyclidine Other (See Comments)    PCP derived antibiotic > Insomnia  . Spironolactone Other (See Comments)    Gynecomastia  . Oxybutynin Chloride     Other reaction(s): low BP  . Lactose Intolerance (Gi) Diarrhea and Other (See Comments)    cramps  . Penicillins Other (See Comments)    Cramps in stomach Did it involve swelling of the face/tongue/throat, SOB, or low BP? No Did it involve sudden or severe rash/hives, skin peeling, or any reaction on the inside of your mouth or nose? No Did you need to seek medical attention at a hospital or doctor's office? No When did it last happen?unknown If all above answers are "NO", may proceed with cephalosporin use.     . Sulfur Rash    Patient Measurements: Height: 6\' 2"  (188 cm) Weight: 109.6 kg (241 lb 9.6 oz) IBW/kg (Calculated) : 82.2 Heparin Dosing Weight: 102 kg  Vital Signs: Temp: 97.7 F (36.5 C) (01/11 0614) Temp Source: Oral (01/11 0614) BP: 115/74 (01/11 4193) Pulse Rate: 89 (01/11 0614)  Labs: Recent Labs    12/09/20 0845 12/09/20 1334 12/10/20 0449 12/10/20 2046 12/11/20 0604  HGB 12.9*  --   --   --  13.0  HCT 38.6*  --   --   --  38.1*  PLT 463*  --   --   --  404*  HEPARINUNFRC  --   --   --  0.12* 0.56  CREATININE 2.10* 2.18* 2.35*  --  2.84*    Estimated Creatinine Clearance: 36.9 mL/min (A) (by C-G formula based on SCr of 2.84 mg/dL (H)).  Assessment: Patient is a 60 y.o M with hx heart transplant, Hodgkin lymphoma and bladder cancer who was recently hospitalized from 12/30 to 1/3 for PNA.  He also underwent cystoscopy with right ureteral stent placement on 11/29/20 for right hydronephrosis and was also found to have new retroperitoneal lymphadenopathy during this admission.  He went to see his urologist on 1/5 with c/o back  pain and was sent to [facility] for further workup.  He underwent CT guided right retroperitoneal lymph node biopsy on 1/6 with retroperitoneal hemorrhage noted on post-biopsy CT. DDimer was >20 on 1/10 with LE doppler positive for acute RLE DVT. Pharmacy is consulted to start heparin drip for acute VTE.   Heparin level now therapeutic this AM after IV heparin rate increased last PM  CBC ok  SCr rising  No reported bleeding or issues of note  Goal of Therapy:  Heparin level 0.3-0.7 units/ml Monitor platelets by anticoagulation protocol: Yes   Plan:   Continue IV heparin at current rate of 1750 units/hr (no bolus per MD's request)  Recheck heparin level at noon to confirm continued goal levels at current heparin rate  Monitor for s/sx bleeding  Daily CBC, heparin level  Kara Mead 12/11/2020,8:03 AM

## 2020-12-11 NOTE — Progress Notes (Signed)
Chaplain engaged in initial visit with Glenn Gonzalez.  Chaplain introduced herself and offered support.  Glenn Gonzalez shared that he is from [place] and has family support.  Chaplain offered prayer over Glenn Gonzalez and he welcomed it.  Chaplain prayed over Glenn Gonzalez and asked if would be ok for chaplain to continue to check on him and he stated yes.  Chaplain will continue to follow-up.     12/11/20 1100  Clinical Encounter Type  Visited With Patient  Visit Type Initial;Spiritual support

## 2020-12-11 NOTE — Progress Notes (Signed)
Referral to [facility]-- Dr Lawanna Kobus placed.

## 2020-12-11 NOTE — Progress Notes (Addendum)
[facility]  PROGRESS NOTE  Glenn Gonzalez YBO:[id number] DOB: 1960-12-04 DOA: 12/27/2020 PCP: Lajean Manes, MD Admit date - 12/28/2020   Admitting Physician Desiree Hane, MD  Outpatient Primary MD for the patient is Lajean Manes, MD  LOS - 4 Brief Narrative   Glenn Gonzalez is a 60 y.o. year old male with medical history significant for Cardiac transplant on tacrolimus and prednisone, chronic combined systolic/diastolic CHF, GERD, high-grade T1 bladder cancer with recent hospitalization from 27/78-2/4 for AKI complicated by hydronephrosis in the setting of new retroperitoneal lymphadenopathy patient underwent stent placement by urology and was treated for postoperative multifocal pneumonia.  He presented from urology office with reports of increased back pain not well controlled on oral pain medication as well as generalized fatigue, poor appetite was brought in under observation due to concern for back pain in the setting of known retroperitoneal lymphadenopathy.  Hospital course complicated by acute hyponatremia in the setting of poor appetite requiring IV fluids and persistent leukocytosis.  CT abdomen imaging consistent with retroperitoneal adenopathy and dilation of right renal pelvis and collecting systems concerning for urothelial neoplasm with extensive metastatic process. Found to have Acute right sided DVT on venous duplex started on heparin on 1/10  Subjective  Pain controlled at rest but worried about his pain when he has to move to use the bathroom. He had really bad reflux overnight but it seems a bit better this morning with breakfast  A & P   Acute Right DVT, new diagnosis Venous duplex confirmed. Last duplex a week ago negative D dimer > 20. Unable to get CTA because of AKI on CKD. TTE shows moderate reduced RV systolic function with TV regurgitation making difficult to measure PA pressure. Required upwards of 5 L O2 with exertion earlier, but at rest  requiring 1 L and now on room air. V/q scan last admission with intermediate probability of PE.  --IV heparin without bolus dosing because of issues with gross hematuria on anticoagulation during last hospital stay and recent small retroperitoneal hemorrhage after IR biopsy on 1/6, pharmacy managing --monitor CBC, hgb remains stable --monitor for signs and symptoms of bleeding, none currently  Retroperitoneal adenopathy and perinephric nodularity with ongoing back pain.   Biopsy positive for poorly differentiated carcinoma on pathology.  ddx includes urothelial carcinoma. Pain better controlled. in setting of immunosuppression differential also includes posttransplant lymphoproliferative disorder/follow-up, patient has prior history of Hodgkin's lymphoma.  Status post IR guided retroperitoneal biopsy on 1/6 - appreciate medical oncology recommendations, sending for molecular testing, and discussing treatment options given complicated immunosuppression and heart transplant hx -continue neurochecks - Appreciate palliative medicine assistance with pain control: OxyContin 10 mg twice daily, oxycodone 10 mg every 4 hours as needed moderate pain, IV dilaudid 1 mg every 4 hours as needed severe pain (he needed to start low to ensure good tolerance). - Optimize bowel regimen : schedule colace, and will schedule lactulose 10 g BID and monitor  Constipation, worse No BM in 48 hours Stool noted throughout the colon on CT abdomen on admission.  Patient reports no bowel movement for the past week.  Likely related to opioid regimen for pain control.  - Senna docusate scheduled -lactulose 10 g to be scheduled to BID, if not improved can add glycerin suppository  Acute on chronic hyponatremia, slowly improving.  Likely all be worsened by lymph obstruction from retroperitoneal lymphadenopathyTSH, cortisol wnl.Nadir of 119. Initially improving with IVF as etiology believed to be diminished oral intake and poor  appetite, but now looks volume overloaded on exam with pitting edema and small pleural effusion on cxr concerning for acute on chronic diastolic chf exacerbation but kidney function worsened.  --off IVF --appreciate nephrology assistance, discontinue IV lasix -Continue monitor BMP  Pericardial effusion, small. Incidentally noted on TTE. No tamponade physiology. Tachycardia and hypotension presumed related to volume overload in setting of chf flare -closely monitor on telemetry  Diminished oral intake, improving.   Likely complicated by retroperitoneal lymphadenopathy as well as some noted ascites though small volume on CT abdomen.  Suspect further complicated by poorly controlled reflux symptoms that are likely exacerbating in setting of his acute illness - Monitor on regular diet -continue IV fluids we will monitor ability to tolerate oral intake  S/p IR guided biopsy with post procedural CT showing focal hemorrhage around the biopsied lymph node on 1/6. Small retroperitoneal hemorrhage noted on post-biopsy CT on 1/6 but additional limited postprocedural CT was negative for expanding hemorrhage or additional complication. Hgb was trended overnight and remained stable and remained hemodynamically stable --low threshold to repeat CT abd if low BP or drop in hgb while on Iv heparin  GERD.  Last EGD 08/2020 within normal limits.  Reports increase reflux symptoms but no overt nausea or vomiting; however unable to tolerate p.o. intake.  Suspect exacerbated in the setting of retroperitoneal lymphadenopathy, small volume ascites, and ongoing constipation - Continue home supplied PPI (Aciphex) -Added sucralfate twice daily - Scheduled Reglan for hiccups  Leukocytosis, stable.  After recent discharge  WBC was 19.7, remains essentially the same.  Remains afebrile. -Daily CBC - Blood cultures if becomes febrile  Prior multifocal pneumonia hospitalization.   NO cough. Opacity looks improved on recent  CXR -Complete cefdinir course from previous hospitalization, end date 1/10  Chronic combined systolic and diastolic CHF.  Prior EF of 15% in 2016 last EF on 10/2020 preserved with 60 to 65%. Repeat TTE shows preserved EF.Small effusion on CXR.  Edema on bilateral lower extremities not CHF exacerbation because with IV lasix --discontinue IVF --start IV lasix BID - Daily weights - Monitor ins and outs  Gout, stable -continue allopurinol  History of cardiac transplant.  Denies any chest pain. -Continue home supplied tacrolimus, prednisone  AKI on CKD stage III, worsening Most recent baseline creatinine 1.8 from last hospitalization.  Continues to go up to 2.9. Did not improve with IV lasix for presumed CHF exacerbation.  -avoid nephrotoxins.  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 Irene Limbo also followed by urology as outpatient, appreciate their assistance  Hypothyroidism, TSH stable -continue Synthroid        Family Communication  : None  Code Status : Full  Disposition Plan  :  Patient is from home. Anticipated d/c date: 2 to 3 days. Barriers to d/c or necessity for inpatient status:  discontinue Lasix, IV heparin for acute DVT, close monitoring of Na, kidney function, determining treatment of poorly differentiated cancer  Persistent pain for retroperitoneal lymphadenopathy Consults  :  IR, oncology, palliative medicine  Procedures  : IR guided biopsy on 1/6  DVT Prophylaxis  :  SCDS  MDM: The below labs and imaging reports were reviewed and summarized above.  Medication management as above.  Lab Results  Component Value Date   PLT 404 (H) 12/11/2020    Diet :  Diet Order            DIET SOFT Room service appropriate?  Yes; Fluid consistency: Thin  Diet effective now                  Inpatient Medications Scheduled Meds: . allopurinol  200 mg Oral Daily  . azelastine  1  spray Each Nare Daily   And  . fluticasone  1 spray Each Nare Daily  . docusate sodium  100 mg Oral BID  . lactulose  10 g Oral BID  . levothyroxine  125 mcg Oral Daily  . loratadine  10 mg Oral Daily  . metoCLOPramide  5 mg Oral TID AC & HS  . oxyCODONE  10 mg Oral Q12H  . pantoprazole  40 mg Oral BID  . predniSONE  5 mg Oral Daily  . senna-docusate  2 tablet Oral BID  . sucralfate  1 g Oral BID AC & HS  . Tacrolimus ER  1.5 mg Oral Daily  . traZODone  100 mg Oral QHS   Continuous Infusions: . heparin 1,750 Units/hr (12/11/20 0727)   PRN Meds:.acetaminophen **OR** acetaminophen, calcium carbonate, Glycerin (Adult), HYDROmorphone (DILAUDID) injection, magnesium hydroxide, ondansetron **OR** ondansetron (ZOFRAN) IV, oxyCODONE  Antibiotics  :   Anti-infectives (From admission, onward)   Start     Dose/Rate Route Frequency Ordered Stop   12/03/2020 2200  cefdinir (OMNICEF) capsule 300 mg        300 mg Oral 2 times daily 12/09/2020 1716 12/10/20 2159       Objective   Vitals:   12/10/20 2205 12/11/20 0228 12/11/20 0614 12/11/20 1344  BP: 108/71 104/68 115/74 113/65  Pulse: 100 90 89 96  Resp: 16 16 16 12   Temp: 97.9 F (36.6 C) 98.1 F (36.7 C) 97.7 F (36.5 C) 98.4 F (36.9 C)  TempSrc: Oral Oral Oral Oral  SpO2: 93% 93% 94% 99%  Weight:      Height:        SpO2: 99 % O2 Flow Rate (L/min): 1 L/min  Wt Readings from Last 3 Encounters:  12/10/20 109.6 kg  11/29/20 100.2 kg  08/31/20 101.2 kg     Intake/Output Summary (Last 24 hours) at 12/11/2020 1449 Last data filed at 12/11/2020 1044 Gross per 24 hour  Intake 430.49 ml  Output 1200 ml  Net -769.51 ml    Physical Exam:  Awake Alert, Oriented X 3, Normal affect Normal oral mucosa Able to move all extremities without any focal deficits appreciated Arthur.AT, Regular rate and rhythm, 2+ pitting edema of bilateral lower extremities to mid thighs, no appreciable JVD  Normal respiratory effort on room air, no  conversation dyspnea +ve B.Sounds, Abd Soft and distended, No tenderness, No rebound, guarding or rigidity. No Cyanosis, No new Rash or bruise    I have personally reviewed the following:   Data Reviewed:  CBC Recent Labs  Lab 12/21/2020 1923 12/06/20 0519 12/06/20 1756 12/07/20 0503 12/08/20 0511 12/09/20 0845 12/11/20 0604  WBC 19.7*   < > 18.4* 17.1*  17.2* 19.3* 17.7* 20.7*  HGB 14.1   < > 13.6 13.9  13.7 13.1 12.9* 13.0  HCT 42.0   < > 40.4 40.8  40.6 39.0 38.6* 38.1*  PLT 418*   < > 446* 438*  440* 446* 463* 404*  MCV 94.0   < > 94.2 94.2  94.2 95.8 96.0 94.1  MCH 31.5   < > 31.7 32.1  31.8 32.2 32.1 32.1  MCHC 33.6   < > 33.7 34.1  33.7 33.6 33.4 34.1  RDW 14.7   < >  14.9 15.0  15.1 15.4 15.6* 15.5  LYMPHSABS 0.8  --   --   --   --   --   --   MONOABS 2.0*  --   --   --   --   --   --   EOSABS 0.0  --   --   --   --   --   --   BASOSABS 0.1  --   --   --   --   --   --    < > = values in this interval not displayed.    Chemistries  Recent Labs  Lab 12/06/2020 1923 12/06/20 0519 12/06/20 1020 12/08/20 0511 12/09/20 0845 12/09/20 1334 12/10/20 0449 12/11/20 0604  NA 119* 121*   < > 127* 123* 127* 126* 126*  K 4.2 3.7   < > 4.3 4.2 4.6 4.7 4.6  CL 82* 85*   < > 95* 93* 95* 95* 93*  CO2 25 22   < > 22 21* 21* 20* 22  GLUCOSE 106* 81   < > 104* 101* 107* 94 105*  BUN 32* 30*   < > 34* 34* 36* 39* 47*  CREATININE 1.81* 1.87*   < > 2.07* 2.10* 2.18* 2.35* 2.84*  CALCIUM 9.2 8.6*   < > 8.5* 8.7* 9.2 9.1 9.6  MG 1.7  --   --   --   --   --   --   --   AST 22 21  --   --   --   --  19 19  ALT 12 11  --   --   --   --  9 10  ALKPHOS 58 54  --   --   --   --  59 60  BILITOT 0.7 0.9  --   --   --   --  0.5 0.5   < > = values in this interval not displayed.   ------------------------------------------------------------------------------------------------------------------ No results for input(s): CHOL, HDL, LDLCALC, TRIG, CHOLHDL, LDLDIRECT in the last 72  hours.  No results found for: HGBA1C ------------------------------------------------------------------------------------------------------------------ No results for input(s): TSH, T4TOTAL, T3FREE, THYROIDAB in the last 72 hours.  Invalid input(s): FREET3 ------------------------------------------------------------------------------------------------------------------ No results for input(s): VITAMINB12, FOLATE, FERRITIN, TIBC, IRON, RETICCTPCT in the last 72 hours.  Coagulation profile No results for input(s): INR, PROTIME in the last 168 hours.  Recent Labs    12/10/20 1156  DDIMER >20.00*    Cardiac Enzymes No results for input(s): CKMB, TROPONINI, MYOGLOBIN in the last 168 hours.  Invalid input(s): CK ------------------------------------------------------------------------------------------------------------------    Component Value Date/Time   BNP 121.1 (H) 11/30/2020 0031    Micro Results Recent Results (from the past 240 hour(s))  SARS CORONAVIRUS 2 (TAT 6-24 HRS) Nasopharyngeal Nasopharyngeal Swab     Status: None   Collection Time: 12/26/2020  1:48 PM   Specimen: Nasopharyngeal Swab  Result Value Ref Range Status   SARS Coronavirus 2 NEGATIVE NEGATIVE Final    Comment: (NOTE) SARS-CoV-2 target nucleic acids are NOT DETECTED.  The SARS-CoV-2 RNA is generally detectable in upper and lower respiratory specimens during the acute phase of infection. Negative results do not preclude SARS-CoV-2 infection, do not rule out co-infections with other pathogens, and should not be used as the sole basis for treatment or other patient management decisions. Negative results must be combined with clinical observations, patient history, and epidemiological information. The expected result is Negative.  Fact Sheet for Patients: SugarRoll.be  Fact  Sheet for Healthcare Providers: https://www.woods-mathews.com/  This test is not yet  approved or cleared by the [place] FDA and  has been authorized for detection and/or diagnosis of SARS-CoV-2 by FDA under an Emergency Use Authorization (EUA). This EUA will remain  in effect (meaning this test can be used) for the duration of the COVID-19 declaration under Se ction 564(b)(1) of the Act, 21 U.S.C. section 360bbb-3(b)(1), unless the authorization is terminated or revoked sooner.  Performed at [facility], [place] [address] [place], [place] [address]     Radiology Reports CT ABDOMEN PELVIS WO CONTRAST  Result Date: 12/21/2020 CLINICAL DATA:  Cancer of unknown primary in this 60 year old male. EXAM: CT CHEST, ABDOMEN AND PELVIS WITHOUT CONTRAST TECHNIQUE: Multidetector CT imaging of the chest, abdomen and pelvis was performed following the standard protocol without IV contrast. COMPARISON:  CT abdomen and pelvis of the same date. Also with CT of December 30th of 2021 FINDINGS: CT CHEST FINDINGS Cardiovascular: Calcified atheromatous plaque in the thoracic aorta. Heart size is normal following sternotomy for heart transplant by report a [facility] medical center. Three-vessel coronary artery calcification. No pericardial effusion. Calcifications in the LEFT atrium partially seen on previous imaging are of uncertain significance central pulmonary vasculature also with some signs of calcification. Mediastinum/Nodes: Ovoid structure associated with LEFT thyroid 11 mm similar to previous imaging, cervical spine from 2017. Thyroid as described above. Calcifications in the LEFT supraclavicular region may be within venous structures on the LEFT. No axillary lymphadenopathy. Surgical clips in the RIGHT axilla and soft tissue thickening over the LEFT pectoralis musculature similar grossly compared to remote MRI evaluation 2018 and surgical clips seen on numerous priors in the RIGHT axilla. No mediastinal lymphadenopathy. No gross hilar lymphadenopathy. Lungs/Pleura: Small RIGHT of  pleural effusion and basilar airspace disease. Small nodule in the superior aspect of the RIGHT middle lobe approximately 3 mm on image 57 of series 5. Biapical scarring. Small nodule in the LEFT upper lobe 6 mm (image 51, series 5) airways are patent. Musculoskeletal: Evidence ir sternotomy without significant bony bridging but with sclerotic margins along the sternotomy line similar to studies dating back to August of 2021 with respect to visualized portions on prior abdomen studies no destructive bone finding or acute bone process. CT ABDOMEN PELVIS FINDINGS Hepatobiliary: 1.9 cm lesion in the RIGHT hepatic lobe near the dome of the RIGHT hemi liver does not display attenuation values of simple cyst is unchanged from the study acquired on the same date at another facility. Additionally a 1.4 cm lesion in the RIGHT hepatic lobe on image 23 of series 2 is unchanged. Sludge in the gallbladder. Tiny low-density foci in the LEFT hepatic lobe without change 1 on image 19 of series 2 in the lateral segment measuring 5 mm and another in the medial segment of the LEFT hepatic lobe measuring 12 mm on image 21 of series 2 these areas appears similar dating back to August of 2020, lesions in the RIGHT hepatic lobe are new. Pancreas: Normal pancreatic contour without signs of inflammation. Spleen: Post splenectomy. Adrenals/Urinary Tract: Adrenal glands, normal on the LEFT and obscured on the RIGHT by perinephric and Peri adrenal nodularity. Fullness of the RIGHT renal pelvis with nephroureteral stent in place showing soft tissue density with distension of the renal pelvis and calices similar to the study acquired on the same date. Nephrolithiasis also unchanged. Perinephric nodularity along the medial upper pole the LEFT kidney measures 6.4 x 2.7 cm which is within  1-2 mm of its previous measurement when measured by this observer on the prior study. Increased in size when compared to the study of November 30, 2020. Renal  cortical scarring on the LEFT with nephrolithiasis in the lower pole unchanged from very recent comparison. Urinary bladder with small amount of gas in the urinary bladder with distal aspect of the stent in place, loop coiled in the urinary bladder. Stomach/Bowel: Scattered colonic diverticulosis without acute gastrointestinal process. Normal appendix. Stool and contrast throughout the colon. Vascular/Lymphatic: Calcified atheromatous plaque in the abdominal aorta without aneurysmal dilation. Retroperitoneal adenopathy unchanged from scan earlier the same day, largest lymph nodes behind the inferior vena cava and in the intra-aortocaval groove and in the upper abdomen, largest on image 41 of series 2 measuring 1.9 cm. Smaller lymph nodes along the LEFT periaortic chain. Postoperative changes in the retroperitoneum associated with previous lymphadenectomy No pelvic lymphadenopathy. Reproductive: Prostate with uro lift device deployment bilaterally similar to recent priors. Other: Extensive retroperitoneal fascial thickening/stranding extending into the root of the small bowel mesentery, nodular changes associated with this thickening on the RIGHT outlining the renal fascia this crosses the midline where it is without significant nodularity but with fascial thickening and stranding, also tracking into the pelvis. Small amount of fluid in the pelvis. Musculoskeletal: Spinal degenerative changes. No destructive bone process or acute bone finding. IMPRESSION: 1. Retroperitoneal adenopathy and perinephric nodularity with dilation of the RIGHT renal pelvis and collecting systems, filled with what is suspected to represent neoplasm. 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 Differential considerations given heart transplantation and presumed immunosuppression would include posttransplant lymphoproliferative disorder/lymphoma.  2. Dense material in collecting systems may represent a mixture of tumor and or blood and there is profound dilation of collecting systems of the RIGHT kidney. This is unchanged compared to the recent comparison study. 3. Signs of hepatic involvement with new lesions in the RIGHT hepatic lobe. 4. Ascites with small amount of ascites with very subtle infiltration of fat in the omentum not mentioned above, for instance on image 50 of series 2 raising the question of peritoneal involvement as well. This area is in the range of 2-3 mm. 5. Small RIGHT pleural effusion and basilar airspace disease. 6. Postoperative changes about the chest likely related to prior heart transplantation with surgical clips in the RIGHT axilla and thickening and calcification along the anterior pectoralis musculature not changed since 2018. 7. Calcification in the LEFT atrium also about the aortic root and pulmonary artery and to a lesser degree in the RIGHT atrium likely reflects changes of vascular anastomotic sites in the setting of heart transplantation. 8. 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 9. Post splenectomy. 10. Aortic atherosclerosis. 11. Small nodule in the LEFT upper lobe 6 mm airways are patent. These results will be called to the ordering clinician or representative by the Radiologist Assistant, and communication documented in the PACS or [employer]. Aortic Atherosclerosis (ICD10-I70.0). Electronically Signed   By: Zetta Bills M.D.   On: 12/26/2020 17:38   DG Chest 1 View  Result Date: 11/29/2020 CLINICAL DATA:  Hypoxia EXAM: CHEST  1 VIEW COMPARISON:  08/03/2020 FINDINGS: Since the prior examination, there has developed right basilar consolidation, possibly infectious in the appropriate clinical setting. Mild focal infiltrate is also noted within the right apex. Left lung is clear. No pneumothorax or pleural effusion. Median  sternotomy has been performed. Cardiac size within normal limits. Multiple healed right rib fractures are noted. Multiple surgical clips are seen within the right axilla. IMPRESSION: Interval development of multifocal pulmonary infiltrate within the right lung, more focal within the right lung base, suspicious for atypical infection in the acute setting. Electronically Signed   By: Fidela Salisbury MD   On: 11/29/2020 22:27   DG Chest 2 View  Result Date: 12/23/2020 CLINICAL DATA:  Bladder cancer.  Follow-up multifocal pneumonia. EXAM: CHEST - 2 VIEW COMPARISON:  11/29/2020 FINDINGS: Prior median sternotomy. Heart is normal size. Patchy right lung airspace disease in the right apex and right lower lung. Minimal left base linear atelectasis or scarring. Overall aeration in the right lung slightly improved. No effusions. IMPRESSION: Patchy right lung airspace disease with slight improvement since prior study. Left base atelectasis or scarring. Electronically Signed   By: Rolm Baptise M.D.   On: 12/19/2020 13:25   CT CHEST WO CONTRAST  Result Date: 12/06/2020 CLINICAL DATA:  Cancer of unknown primary in this 60 year old male. EXAM: CT CHEST, ABDOMEN AND PELVIS WITHOUT CONTRAST TECHNIQUE: Multidetector CT imaging of the chest, abdomen and pelvis was performed following the standard protocol without IV contrast. COMPARISON:  CT abdomen and pelvis of the same date. Also with CT of December 30th of 2021 FINDINGS: CT CHEST FINDINGS Cardiovascular: Calcified atheromatous plaque in the thoracic aorta. Heart size is normal following sternotomy for heart transplant by report a [facility] medical center. Three-vessel coronary artery calcification. No pericardial effusion. Calcifications in the LEFT atrium partially seen on previous imaging are of uncertain significance central pulmonary vasculature also with some signs of calcification. Mediastinum/Nodes: Ovoid structure associated with LEFT thyroid 11 mm similar to  previous imaging, cervical spine from 2017. Thyroid as described above. Calcifications in the LEFT supraclavicular region may be within venous structures on the LEFT. No axillary lymphadenopathy. Surgical clips in the RIGHT axilla and soft tissue thickening over the LEFT pectoralis musculature similar grossly compared to remote MRI evaluation 2018 and surgical clips seen on numerous priors in the RIGHT axilla. No mediastinal lymphadenopathy. No gross hilar lymphadenopathy. Lungs/Pleura: Small RIGHT of pleural effusion and basilar airspace disease. Small nodule in the superior aspect of the RIGHT middle lobe approximately 3 mm on image 57 of series 5. Biapical scarring. Small nodule in the LEFT upper lobe 6 mm (image 51, series 5) airways are patent. Musculoskeletal: Evidence ir sternotomy without significant bony bridging but with sclerotic margins along the sternotomy line similar to studies dating back to August of 2021 with respect to visualized portions on prior abdomen studies no destructive bone finding or acute bone process. CT ABDOMEN PELVIS FINDINGS Hepatobiliary: 1.9 cm lesion in the RIGHT hepatic lobe near the dome of the RIGHT hemi liver does not display attenuation values of simple cyst is unchanged from the study acquired on the same date at another facility. Additionally a 1.4 cm lesion in the RIGHT hepatic lobe on image 23 of series 2 is unchanged. Sludge in the gallbladder. Tiny low-density foci in the LEFT hepatic lobe without change 1 on image 19 of series 2 in the lateral segment measuring 5 mm and another in the medial segment of the LEFT hepatic lobe measuring 12 mm on image 21 of series 2 these areas appears similar dating back to August of 2020, lesions in the RIGHT hepatic lobe are new. Pancreas: Normal pancreatic contour without signs of inflammation. Spleen: Post splenectomy. Adrenals/Urinary Tract: Adrenal glands, normal on the LEFT  and obscured on the RIGHT by perinephric and Peri  adrenal nodularity. Fullness of the RIGHT renal pelvis with nephroureteral stent in place showing soft tissue density with distension of the renal pelvis and calices similar to the study acquired on the same date. Nephrolithiasis also unchanged. Perinephric nodularity along the medial upper pole the LEFT kidney measures 6.4 x 2.7 cm which is within 1-2 mm of its previous measurement when measured by this observer on the prior study. Increased in size when compared to the study of November 30, 2020. Renal cortical scarring on the LEFT with nephrolithiasis in the lower pole unchanged from very recent comparison. Urinary bladder with small amount of gas in the urinary bladder with distal aspect of the stent in place, loop coiled in the urinary bladder. Stomach/Bowel: Scattered colonic diverticulosis without acute gastrointestinal process. Normal appendix. Stool and contrast throughout the colon. Vascular/Lymphatic: Calcified atheromatous plaque in the abdominal aorta without aneurysmal dilation. Retroperitoneal adenopathy unchanged from scan earlier the same day, largest lymph nodes behind the inferior vena cava and in the intra-aortocaval groove and in the upper abdomen, largest on image 41 of series 2 measuring 1.9 cm. Smaller lymph nodes along the LEFT periaortic chain. Postoperative changes in the retroperitoneum associated with previous lymphadenectomy No pelvic lymphadenopathy. Reproductive: Prostate with uro lift device deployment bilaterally similar to recent priors. Other: Extensive retroperitoneal fascial thickening/stranding extending into the root of the small bowel mesentery, nodular changes associated with this thickening on the RIGHT outlining the renal fascia this crosses the midline where it is without significant nodularity but with fascial thickening and stranding, also tracking into the pelvis. Small amount of fluid in the pelvis. Musculoskeletal: Spinal degenerative changes. No destructive bone  process or acute bone finding. IMPRESSION: 1. Retroperitoneal adenopathy and perinephric nodularity with dilation of the RIGHT renal pelvis and collecting systems, filled with what is suspected to represent neoplasm. 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 Differential considerations given heart transplantation and presumed immunosuppression would include posttransplant lymphoproliferative disorder/lymphoma. 2. Dense material in collecting systems may represent a mixture of tumor and or blood and there is profound dilation of collecting systems of the RIGHT kidney. This is unchanged compared to the recent comparison study. 3. Signs of hepatic involvement with new lesions in the RIGHT hepatic lobe. 4. Ascites with small amount of ascites with very subtle infiltration of fat in the omentum not mentioned above, for instance on image 50 of series 2 raising the question of peritoneal involvement as well. This area is in the range of 2-3 mm. 5. Small RIGHT pleural effusion and basilar airspace disease. 6. Postoperative changes about the chest likely related to prior heart transplantation with surgical clips in the RIGHT axilla and thickening and calcification along the anterior pectoralis musculature not changed since 2018. 7. Calcification in the LEFT atrium also about the aortic root and pulmonary artery and to a lesser degree in the RIGHT atrium likely reflects changes of vascular anastomotic sites in the setting of heart transplantation. 8. 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 9. Post splenectomy. 10. Aortic atherosclerosis. 11. Small nodule in the LEFT upper lobe 6 mm airways are patent. These results will be called to the ordering clinician or representative by the Radiologist Assistant, and communication documented in the PACS or  [employer]. Aortic Atherosclerosis (ICD10-I70.0). Electronically Signed   By: Jewel Baize.D.  On: 12/14/2020 17:38   NM Pulmonary Perfusion  Result Date: 12/09/2020 CLINICAL DATA:  Respiratory failure EXAM: NUCLEAR MEDICINE PERFUSION LUNG SCAN TECHNIQUE: Perfusion images were obtained in multiple projections after intravenous injection of radiopharmaceutical. Ventilation scans intentionally deferred if perfusion scan and chest x-ray adequate for interpretation during COVID 19 epidemic. RADIOPHARMACEUTICALS:  4.1 mCi Tc-65m MAA IV COMPARISON:  Chest x-ray 12/11/2020 FINDINGS: Slightly heterogeneous distribution of radiotracer within the lung fields. Multiple small bilateral wedge-shaped perfusion defects. No corresponding radiographic abnormality. No large mismatched segmental perfusion defect. IMPRESSION: Intermediate probability for pulmonary embolism. Electronically Signed   By: Davina Poke D.O.   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 ANESTHESIA/SEDATION: Fentanyl 100 mcg IV; Versed 4 mg IV Sedation time: 14 minutes; The patient was continuously monitored during the procedure by the interventional radiology nurse under my direct supervision. CONTRAST:  None. COMPLICATIONS: SIR Level A - No therapy, no consequence. Retroperitoneal hemorrhage at site of biopsy, controlled with Gel-Foam slurry administration along needle track. PROCEDURE: Informed consent was obtained from the patient following an explanation of the procedure, risks, benefits and alternatives. A time out was performed prior to the initiation of the procedure. The patient was positioned in a partial left lateral decubitus position on the CT table and a limited CT was performed for procedural planning  demonstrating similar appearing multifocal right retroperitoneal lymphadenopathy. The procedure was planned. The operative site was prepped and draped in the usual sterile fashion. Appropriate trajectory was confirmed with a 22 gauge spinal needle after the adjacent tissues were anesthetized with 1% Lidocaine with epinephrine. Under intermittent CT guidance, a 17 gauge coaxial needle was advanced into the peripheral aspect of the mass. Appropriate positioning was confirmed and a total of 4 samples were obtained with an 18 gauge core needle biopsy device. A limited CT demonstrated focal hemorrhage about the biopsied lymph node. Gel-Foam slurry was injected through the introducer needle along the needle track. The co-axial needle was removed and hemostasis was achieved with manual compression. Additional limited postprocedural CT was negative for expanding hemorrhage or additional complication. A dressing was placed. The patient tolerated the procedure well without immediate postprocedural complication. IMPRESSION: Technically successful CT guided core needle biopsy of right retroperitoneal lymph node. Ruthann Cancer, MD [facility] Electronically Signed   By: Ruthann Cancer MD   On: 12/07/2020 08:53   DG CHEST PORT 1 VIEW  Result Date: 12/10/2020 CLINICAL DATA:  Hypoxia.  History of cardiac transplant EXAM: PORTABLE CHEST 1 VIEW COMPARISON:  12/21/2020 FINDINGS: Prior median sternotomy. Heart size within normal limits. Atherosclerotic calcification of the aortic knob. Subtle right lower lobe opacity, slightly improved from prior. Trace right pleural effusion. No pneumothorax. IMPRESSION: 1. Subtle right lower lobe opacity, slightly improved from prior. 2. Trace right pleural effusion. Electronically Signed   By: Davina Poke D.O.   On: 12/10/2020 10:33   DG C-Arm 1-60 Min-No Report  Result Date: 11/29/2020 Fluoroscopy was utilized by the requesting  physician.  No radiographic interpretation.   ECHOCARDIOGRAM COMPLETE  Result Date: 11/30/2020    ECHOCARDIOGRAM REPORT   Patient Name:   KOLTON KIENLE Voorhies Date of Exam: 11/30/2020 Medical Rec #:  [id number]         Height:       74.0 in Accession #:    [id number]  Weight:       221.0 lb Date of Birth:  02-05-1961        BSA:          2.268 m Patient Age:    76 years          BP:           103/59 mmHg Patient Gender: M                 HR:           94 bpm. Exam Location:  Inpatient Procedure: 2D Echo, Cardiac Doppler, Color Doppler and Intracardiac            Opacification Agent Indications:    Dyspnea R06.00  History:        Patient has prior history of Echocardiogram examinations, most                 recent 05/12/2017. Arrythmias:Atrial Fibrillation; Risk                 Factors:Non-Smoker. GERD. 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  Sonographer:    Vickie Epley RDCS Referring Phys: [id number] [facility]  Sonographer Comments: Suboptimal apical window and suboptimal subcostal window. Pt unable to turn on side due to herniated discs. IMPRESSIONS  1. Left ventricular ejection fraction, by estimation, is 60 to 65%. The left ventricle has normal function. The left ventricle has no regional wall motion abnormalities. Left ventricular diastolic parameters were normal.  2. Right ventricular systolic function is normal. The right ventricular size is normal. There is normal pulmonary artery systolic pressure. The estimated right ventricular systolic pressure is 24.4 mmHg.  3. The mitral valve is normal in structure. No evidence of mitral valve regurgitation. No evidence of mitral stenosis.  4. The aortic valve is normal in structure. Aortic valve regurgitation is not visualized. No aortic stenosis is present.  5. The inferior vena cava is normal in size with greater than 50% respiratory variability, suggesting right atrial pressure of 3 mmHg. FINDINGS  Left Ventricle: Left ventricular ejection fraction, by  estimation, is 60 to 65%. The left ventricle has normal function. The left ventricle has no regional wall motion abnormalities. Definity contrast agent was given IV to delineate the left ventricular  endocardial borders. The left ventricular internal cavity size was normal in size. There is no left ventricular hypertrophy. Left ventricular diastolic parameters were normal. Normal left ventricular filling pressure. Right Ventricle: The right ventricular size is normal. No increase in right ventricular wall thickness. Right ventricular systolic function is normal. There is normal pulmonary artery systolic pressure. The tricuspid regurgitant velocity is 2.39 m/s, and  with an assumed right atrial pressure of 3 mmHg, the estimated right ventricular systolic pressure is 01.0 mmHg. Left Atrium: Left atrial size was normal in size. Right Atrium: Right atrial size was normal in size. Pericardium: There is no evidence of pericardial effusion. Mitral Valve: The mitral valve is normal in structure. No evidence of mitral valve regurgitation. No evidence of mitral valve stenosis. Tricuspid Valve: The tricuspid valve is normal in structure. Tricuspid valve regurgitation is mild . No evidence of tricuspid stenosis. Aortic Valve: The aortic valve is normal in structure. Aortic valve regurgitation is not visualized. No aortic stenosis is present. Pulmonic Valve: The pulmonic valve was normal in structure. Pulmonic valve regurgitation is not visualized. No evidence of pulmonic stenosis. Aorta: The aortic root is normal in size and structure. Venous: The inferior vena cava is normal in size with greater  than 50% respiratory variability, suggesting right atrial pressure of 3 mmHg. IAS/Shunts: No atrial level shunt detected by color flow Doppler.  LEFT VENTRICLE PLAX 2D LVIDd:         4.40 cm     Diastology LVIDs:         3.20 cm     LV e' medial:    10.00 cm/s LV PW:         0.90 cm     LV E/e' medial:  7.5 LV IVS:        0.90 cm      LV e' lateral:   11.30 cm/s LVOT diam:     2.50 cm     LV E/e' lateral: 6.7 LV SV:         48 LV SV Index:   21 LVOT Area:     4.91 cm  LV Volumes (MOD) LV vol d, MOD A4C: 80.2 ml LV vol s, MOD A4C: 30.4 ml LV SV MOD A4C:     80.2 ml LEFT ATRIUM           Index LA diam:      2.70 cm 1.19 cm/m LA Vol (A4C): 16.3 ml 7.19 ml/m  AORTIC VALVE LVOT Vmax:   50.20 cm/s LVOT Vmean:  36.700 cm/s LVOT VTI:    0.099 m  AORTA Ao Root diam: 3.40 cm MITRAL VALVE               TRICUSPID VALVE MV Area (PHT): 5.38 cm    TR Peak grad:   22.8 mmHg MV Decel Time: 141 msec    TR Vmax:        239.00 cm/s MV E velocity: 75.40 cm/s MV A velocity: 36.00 cm/s  SHUNTS MV E/A ratio:  2.09        Systemic VTI:  0.10 m                            Systemic Diam: 2.50 cm Dani Gobble Croitoru MD Electronically signed by Sanda Klein MD Signature Date/Time: 11/30/2020/12:30:40 PM    Final    VAS [place] LOWER EXTREMITY VENOUS (DVT)  Result Date: 12/10/2020  Lower Venous DVT Study Indications: Pain.  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 Comparison Study: Previous 12/02/20 Negative Performing Technologist: Vonzell Schlatter RVT  Examination Guidelines: A complete evaluation includes B-mode imaging, spectral Doppler, color Doppler, and power Doppler as needed of all accessible portions of each vessel. Bilateral testing is considered an integral part of a complete examination. Limited examinations for reoccurring indications may be performed as noted. The reflux portion of the exam is performed with the patient in reverse Trendelenburg.  +---------+---------------+---------+-----------+----------+--------------+ RIGHT    CompressibilityPhasicitySpontaneityPropertiesThrombus Aging +---------+---------------+---------+-----------+----------+--------------+ CFV      Full           Yes      Yes                                 +---------+---------------+---------+-----------+----------+--------------+  SFJ      Full                                                        +---------+---------------+---------+-----------+----------+--------------+  FV Prox  Full                                                        +---------+---------------+---------+-----------+----------+--------------+ FV Mid   Full                                                        +---------+---------------+---------+-----------+----------+--------------+ FV DistalFull                                                        +---------+---------------+---------+-----------+----------+--------------+ PFV      Full                                                        +---------+---------------+---------+-----------+----------+--------------+ POP      Full           Yes      Yes                                 +---------+---------------+---------+-----------+----------+--------------+ PTV      None                                                        +---------+---------------+---------+-----------+----------+--------------+ PERO     None                                                        +---------+---------------+---------+-----------+----------+--------------+   +---------+---------------+---------+-----------+----------+--------------+ LEFT     CompressibilityPhasicitySpontaneityPropertiesThrombus Aging +---------+---------------+---------+-----------+----------+--------------+ CFV      Full           Yes      Yes                                 +---------+---------------+---------+-----------+----------+--------------+ SFJ      Full                                                        +---------+---------------+---------+-----------+----------+--------------+ FV Prox  Full                                                        +---------+---------------+---------+-----------+----------+--------------+  FV Mid   Full                                                         +---------+---------------+---------+-----------+----------+--------------+ FV DistalFull                                                        +---------+---------------+---------+-----------+----------+--------------+ PFV      Full                                                        +---------+---------------+---------+-----------+----------+--------------+ POP      Full           Yes      Yes                                 +---------+---------------+---------+-----------+----------+--------------+ PTV      Full                                                        +---------+---------------+---------+-----------+----------+--------------+ PERO     Full                                                        +---------+---------------+---------+-----------+----------+--------------+     Summary: RIGHT: - Findings consistent with acute deep vein thrombosis involving one right posterior tibial vein, and both right peroneal veins. - No cystic structure found in the popliteal fossa.  LEFT: - There is no evidence of deep vein thrombosis in the lower extremity.  - No cystic structure found in the popliteal fossa.  *See table(s) above for measurements and observations. Electronically signed by Jamelle Haring on 12/10/2020 at 7:16:00 PM.    Final    VAS [place] LOWER EXTREMITY VENOUS (DVT)  Result Date: 12/03/2020  Lower Venous DVT Study Indications: Pain.  Comparison Study: Previous 12/2019 Performing Technologist: Vonzell Schlatter RVT  Examination Guidelines: A complete evaluation includes B-mode imaging, spectral Doppler, color Doppler, and power Doppler as needed of all accessible portions of each vessel. Bilateral testing is considered an integral part of a complete examination. Limited examinations for reoccurring indications may be performed as noted. The reflux portion of the exam is performed with the patient in reverse Trendelenburg.   +---------+---------------+---------+-----------+----------+--------------+ RIGHT    CompressibilityPhasicitySpontaneityPropertiesThrombus Aging +---------+---------------+---------+-----------+----------+--------------+ CFV      Full           Yes      Yes                                 +---------+---------------+---------+-----------+----------+--------------+  SFJ      Full                                                        +---------+---------------+---------+-----------+----------+--------------+ FV Prox  Full                                                        +---------+---------------+---------+-----------+----------+--------------+ FV Mid   Full                                                        +---------+---------------+---------+-----------+----------+--------------+ FV DistalFull                                                        +---------+---------------+---------+-----------+----------+--------------+ PFV      Full                                                        +---------+---------------+---------+-----------+----------+--------------+ POP      Full           Yes      Yes                                 +---------+---------------+---------+-----------+----------+--------------+ PTV      Full                                                        +---------+---------------+---------+-----------+----------+--------------+ PERO     Full                                                        +---------+---------------+---------+-----------+----------+--------------+   +---------+---------------+---------+-----------+----------+--------------+ LEFT     CompressibilityPhasicitySpontaneityPropertiesThrombus Aging +---------+---------------+---------+-----------+----------+--------------+ CFV      Full           Yes      Yes                                  +---------+---------------+---------+-----------+----------+--------------+ SFJ      Full                                                        +---------+---------------+---------+-----------+----------+--------------+  FV Prox  Full                                                        +---------+---------------+---------+-----------+----------+--------------+ FV Mid   Full                                                        +---------+---------------+---------+-----------+----------+--------------+ FV DistalFull                                                        +---------+---------------+---------+-----------+----------+--------------+ PFV      Full                                                        +---------+---------------+---------+-----------+----------+--------------+ POP      Full           Yes      Yes                                 +---------+---------------+---------+-----------+----------+--------------+ PTV      Full                                                        +---------+---------------+---------+-----------+----------+--------------+ PERO     Full                                                        +---------+---------------+---------+-----------+----------+--------------+     Summary: RIGHT: - There is no evidence of deep vein thrombosis in the lower extremity.  - No cystic structure found in the popliteal fossa.  LEFT: - There is no evidence of deep vein thrombosis in the lower extremity.  - No cystic structure found in the popliteal fossa.  *See table(s) above for measurements and observations. Electronically signed by Ruta Hinds MD on 12/03/2020 at 12:15:41 PM.    Final    ECHOCARDIOGRAM LIMITED  Result Date: 12/10/2020    ECHOCARDIOGRAM LIMITED REPORT   Patient Name:   HARROL NOVELLO Leng Date of Exam: 12/10/2020 Medical Rec #:  [id number]         Height:       74.0 in Accession #:    [id number]        Weight:        230.4 lb Date of Birth:  1961/03/10        BSA:          2.308 m Patient Age:  59 years          BP:           105/72 mmHg Patient Gender: M                 HR:           99 bpm. Exam Location:  Inpatient Procedure: Limited Echo, Cardiac Doppler and Color Doppler Indications:    Acute DVT  History:        Patient has prior history of Echocardiogram examinations, most                 recent 11/30/2020. Arrythmias:Atrial Fibrillation. CKD. GERD.                 S/P heart transplant 01/22/16.  Sonographer:    Clayton Lefort RDCS (AE) Referring Phys: [id number] [facility]  Sonographer Comments: Technically difficult study due to poor echo windows, suboptimal apical window and suboptimal subcostal window. Unable to roll patient due to herniated discs. IMPRESSIONS  1. S/P Heart Transplant. Left ventricular ejection fraction, by estimation, is 60 to 65%. The left ventricle has normal function. Left ventricular endocardial border not optimally defined to evaluate regional wall motion. There is moderate concentric left ventricular hypertrophy. Left ventricular diastolic function could not be evaluated.  2. Right ventricular systolic function is moderately reduced. The right ventricular size is mildly enlarged. Tricuspid regurgitation signal is inadequate for assessing PA pressure.  3. A small pericardial effusion is present. The pericardial effusion is surrounding the apex.  4. Tricuspid valve regurgitation is mild to moderate. FINDINGS  Left Ventricle: S/P Heart Transplant. Left ventricular ejection fraction, by estimation, is 60 to 65%. The left ventricle has normal function. Left ventricular endocardial border not optimally defined to evaluate regional wall motion. There is moderate concentric left ventricular hypertrophy. Left ventricular diastolic function could not be evaluated. Right Ventricle: The right ventricular size is mildly enlarged. Right ventricular systolic function is moderately reduced. Tricuspid  regurgitation signal is inadequate for assessing PA pressure. Pericardium: A small pericardial effusion is present. The pericardial effusion is surrounding the apex. Tricuspid Valve: Tricuspid valve regurgitation is mild to moderate. Venous: The inferior vena cava was not well visualized. LEFT VENTRICLE PLAX 2D LVIDd:         3.60 cm LVIDs:         2.30 cm LV PW:         1.40 cm LV IVS:        1.50 cm LVOT diam:     2.50 cm LVOT Area:     4.91 cm  LEFT ATRIUM         Index LA diam:    2.80 cm 1.21 cm/m   AORTA Ao Root diam: 3.70 cm Ao Asc diam:  3.10 cm TRICUSPID VALVE TR Peak grad:   52.4 mmHg TR Vmax:        362.00 cm/s  SHUNTS Systemic Diam: 2.50 cm Fransico Him MD Electronically signed by Fransico Him MD Signature Date/Time: 12/10/2020/3:35:06 PM    Final      Time Spent in minutes  30     Desiree Hane M.D on 12/11/2020 at 2:49 PM  To page go to [email or web address] - password [facility]

## 2020-12-11 NOTE — Progress Notes (Signed)
Subjective: Patient reports continued pain but controlled with opioids..  Objective: Vital signs in last 24 hours: Temp:  [97.7 F (36.5 C)-98.7 F (37.1 C)] 98.4 F (36.9 C) (01/11 1344) Pulse Rate:  [89-109] 96 (01/11 1344) Resp:  [12-16] 12 (01/11 1344) BP: (104-115)/(65-74) 113/65 (01/11 1344) SpO2:  [93 %-99 %] 99 % (01/11 1344) Weight:  [106.2 kg-109.6 kg] 109.6 kg (01/10 1804)  Intake/Output from previous day: 01/10 0701 - 01/11 0700 In: 430.5 [P.O.:240; I.V.:180.5] Out: 1000 [Urine:1000] Intake/Output this shift: Total I/O In: 360 [P.O.:360] Out: 400 [Urine:400]  Physical Exam:  General:ill appearing in NAD GI: not done Male genitalia: not done   Lab Results: Recent Labs    12/09/20 0845 12/11/20 0604  HGB 12.9* 13.0  HCT 38.6* 38.1*   BMET Recent Labs    12/10/20 0449 12/11/20 0604  NA 126* 126*  K 4.7 4.6  CL 95* 93*  CO2 20* 22  GLUCOSE 94 105*  BUN 39* 47*  CREATININE 2.35* 2.84*  CALCIUM 9.1 9.6   No results for input(s): LABPT, INR in the last 72 hours. No results for input(s): LABURIN in the last 72 hours. Results for orders placed or performed during the hospital encounter of 12/18/2020  SARS CORONAVIRUS 2 (TAT 6-24 HRS) Nasopharyngeal Nasopharyngeal Swab     Status: None   Collection Time: 12/02/2020  1:48 PM   Specimen: Nasopharyngeal Swab  Result Value Ref Range Status   SARS Coronavirus 2 NEGATIVE NEGATIVE Final    Comment: (NOTE) SARS-CoV-2 target nucleic acids are NOT DETECTED.  The SARS-CoV-2 RNA is generally detectable in upper and lower respiratory specimens during the acute phase of infection. Negative results do not preclude SARS-CoV-2 infection, do not rule out co-infections with other pathogens, and should not be used as the sole basis for treatment or other patient management decisions. Negative results must be combined with clinical observations, patient history, and epidemiological information. The expected result is  Negative.  Fact Sheet for Patients: SugarRoll.be  Fact Sheet for Healthcare Providers: https://www.woods-mathews.com/  This test is not yet approved or cleared by the [place] FDA and  has been authorized for detection and/or diagnosis of SARS-CoV-2 by FDA under an Emergency Use Authorization (EUA). This EUA will remain  in effect (meaning this test can be used) for the duration of the COVID-19 declaration under Se ction 564(b)(1) of the Act, 21 U.S.C. section 360bbb-3(b)(1), unless the authorization is terminated or revoked sooner.  Performed at [facility], [address] [place], [place] [address]     Studies/Results: [place] CHEST PORT 1 VIEW  Result Date: 12/10/2020 CLINICAL DATA:  Hypoxia.  History of cardiac transplant EXAM: PORTABLE CHEST 1 VIEW COMPARISON:  12/21/2020 FINDINGS: Prior median sternotomy. Heart size within normal limits. Atherosclerotic calcification of the aortic knob. Subtle right lower lobe opacity, slightly improved from prior. Trace right pleural effusion. No pneumothorax. IMPRESSION: 1. Subtle right lower lobe opacity, slightly improved from prior. 2. Trace right pleural effusion. Electronically Signed   By: Davina Poke D.O.   On: 12/10/2020 10:33   VAS [place] LOWER EXTREMITY VENOUS (DVT)  Result Date: 12/10/2020  Lower Venous DVT Study Indications: Pain.  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 Comparison Study: Previous 12/02/20 Negative Performing Technologist: Vonzell Schlatter RVT  Examination Guidelines: A complete evaluation includes B-mode imaging, spectral Doppler, color Doppler, and power Doppler as needed of all accessible portions of each vessel. Bilateral testing is considered an integral part  of a complete examination. Limited examinations for reoccurring indications may be performed as noted. The reflux portion of the exam is performed with the patient in  reverse Trendelenburg.  +---------+---------------+---------+-----------+----------+--------------+ RIGHT    CompressibilityPhasicitySpontaneityPropertiesThrombus Aging +---------+---------------+---------+-----------+----------+--------------+ CFV      Full           Yes      Yes                                 +---------+---------------+---------+-----------+----------+--------------+ SFJ      Full                                                        +---------+---------------+---------+-----------+----------+--------------+ FV Prox  Full                                                        +---------+---------------+---------+-----------+----------+--------------+ FV Mid   Full                                                        +---------+---------------+---------+-----------+----------+--------------+ FV DistalFull                                                        +---------+---------------+---------+-----------+----------+--------------+ PFV      Full                                                        +---------+---------------+---------+-----------+----------+--------------+ POP      Full           Yes      Yes                                 +---------+---------------+---------+-----------+----------+--------------+ PTV      None                                                        +---------+---------------+---------+-----------+----------+--------------+ PERO     None                                                        +---------+---------------+---------+-----------+----------+--------------+   +---------+---------------+---------+-----------+----------+--------------+ LEFT     CompressibilityPhasicitySpontaneityPropertiesThrombus Aging +---------+---------------+---------+-----------+----------+--------------+ CFV      Full  Yes      Yes                                  +---------+---------------+---------+-----------+----------+--------------+ SFJ      Full                                                        +---------+---------------+---------+-----------+----------+--------------+ FV Prox  Full                                                        +---------+---------------+---------+-----------+----------+--------------+ FV Mid   Full                                                        +---------+---------------+---------+-----------+----------+--------------+ FV DistalFull                                                        +---------+---------------+---------+-----------+----------+--------------+ PFV      Full                                                        +---------+---------------+---------+-----------+----------+--------------+ POP      Full           Yes      Yes                                 +---------+---------------+---------+-----------+----------+--------------+ PTV      Full                                                        +---------+---------------+---------+-----------+----------+--------------+ PERO     Full                                                        +---------+---------------+---------+-----------+----------+--------------+     Summary: RIGHT: - Findings consistent with acute deep vein thrombosis involving one right posterior tibial vein, and both right peroneal veins. - No cystic structure found in the popliteal fossa.  LEFT: - There is no evidence of deep vein thrombosis in the lower extremity.  - No cystic structure found in the popliteal fossa.  *See table(s) above for measurements and observations. Electronically signed by Jamelle Haring  on 12/10/2020 at 7:16:00 PM.    Final    ECHOCARDIOGRAM LIMITED  Result Date: 12/10/2020    ECHOCARDIOGRAM LIMITED REPORT   Patient Name:   ZAKAREE MCCLENAHAN Tandy Date of Exam: 12/10/2020 Medical Rec #:  [id number]         Height:        74.0 in Accession #:    [id number]        Weight:       230.4 lb Date of Birth:  27-Nov-1961        BSA:          2.308 m Patient Age:    89 years          BP:           105/72 mmHg Patient Gender: M                 HR:           99 bpm. Exam Location:  Inpatient Procedure: Limited Echo, Cardiac Doppler and Color Doppler Indications:    Acute DVT  History:        Patient has prior history of Echocardiogram examinations, most                 recent 11/30/2020. Arrythmias:Atrial Fibrillation. CKD. GERD.                 S/P heart transplant 01/22/16.  Sonographer:    Clayton Lefort RDCS (AE) Referring Phys: [id number] [place] D NETTEY  Sonographer Comments: Technically difficult study due to poor echo windows, suboptimal apical window and suboptimal subcostal window. Unable to roll patient due to herniated discs. IMPRESSIONS  1. S/P Heart Transplant. Left ventricular ejection fraction, by estimation, is 60 to 65%. The left ventricle has normal function. Left ventricular endocardial border not optimally defined to evaluate regional wall motion. There is moderate concentric left ventricular hypertrophy. Left ventricular diastolic function could not be evaluated.  2. Right ventricular systolic function is moderately reduced. The right ventricular size is mildly enlarged. Tricuspid regurgitation signal is inadequate for assessing PA pressure.  3. A small pericardial effusion is present. The pericardial effusion is surrounding the apex.  4. Tricuspid valve regurgitation is mild to moderate. FINDINGS  Left Ventricle: S/P Heart Transplant. Left ventricular ejection fraction, by estimation, is 60 to 65%. The left ventricle has normal function. Left ventricular endocardial border not optimally defined to evaluate regional wall motion. There is moderate concentric left ventricular hypertrophy. Left ventricular diastolic function could not be evaluated. Right Ventricle: The right ventricular size is mildly enlarged. Right ventricular  systolic function is moderately reduced. Tricuspid regurgitation signal is inadequate for assessing PA pressure. Pericardium: A small pericardial effusion is present. The pericardial effusion is surrounding the apex. Tricuspid Valve: Tricuspid valve regurgitation is mild to moderate. Venous: The inferior vena cava was not well visualized. LEFT VENTRICLE PLAX 2D LVIDd:         3.60 cm LVIDs:         2.30 cm LV PW:         1.40 cm LV IVS:        1.50 cm LVOT diam:     2.50 cm LVOT Area:     4.91 cm  LEFT ATRIUM         Index LA diam:    2.80 cm 1.21 cm/m   AORTA Ao Root diam: 3.70 cm Ao Asc diam:  3.10 cm TRICUSPID VALVE TR Peak grad:   52.4 mmHg  TR Vmax:        362.00 cm/s  SHUNTS Systemic Diam: 2.50 cm Fransico Him MD Electronically signed by Fransico Him MD Signature Date/Time: 12/10/2020/3:35:06 PM    Final     Assessment/Plan: High grade carcinoma based on recent retroperitoneal Bx,awaiting further eval of tissue to see if cell type can be delineated but 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 Whether any systemic therapy could be tried-per Hemeonc recs. ? Role for palliative RT based on cell type? Will follow along    LOS: 4 days   Remi Haggard 12/11/2020, 3:21 PM

## 2020-12-12 DIAGNOSIS — C7910 Secondary malignant neoplasm of unspecified urinary organs: Secondary | ICD-10-CM | POA: Diagnosis not present

## 2020-12-12 DIAGNOSIS — Z515 Encounter for palliative care: Secondary | ICD-10-CM | POA: Diagnosis not present

## 2020-12-12 DIAGNOSIS — K5903 Drug induced constipation: Secondary | ICD-10-CM | POA: Diagnosis not present

## 2020-12-12 DIAGNOSIS — G893 Neoplasm related pain (acute) (chronic): Secondary | ICD-10-CM | POA: Diagnosis not present

## 2020-12-12 DIAGNOSIS — C801 Malignant (primary) neoplasm, unspecified: Secondary | ICD-10-CM | POA: Diagnosis not present

## 2020-12-12 LAB — CBC
HCT: 40.1 % (ref 39.0–52.0)
Hemoglobin: 13.2 g/dL (ref 13.0–17.0)
MCH: 31.6 pg (ref 26.0–34.0)
MCHC: 32.9 g/dL (ref 30.0–36.0)
MCV: 95.9 fL (ref 80.0–100.0)
Platelets: 408 10*3/uL — ABNORMAL HIGH (ref 150–400)
RBC: 4.18 MIL/uL — ABNORMAL LOW (ref 4.22–5.81)
RDW: 15.6 % — ABNORMAL HIGH (ref 11.5–15.5)
WBC: 21.4 10*3/uL — ABNORMAL HIGH (ref 4.0–10.5)
nRBC: 0 % (ref 0.0–0.2)

## 2020-12-12 LAB — COMPREHENSIVE METABOLIC PANEL
ALT: 9 U/L (ref 0–44)
AST: 20 U/L (ref 15–41)
Albumin: 2.8 g/dL — ABNORMAL LOW (ref 3.5–5.0)
Alkaline Phosphatase: 60 U/L (ref 38–126)
Anion gap: 14 (ref 5–15)
BUN: 55 mg/dL — ABNORMAL HIGH (ref 6–20)
CO2: 21 mmol/L — ABNORMAL LOW (ref 22–32)
Calcium: 9.3 mg/dL (ref 8.9–10.3)
Chloride: 91 mmol/L — ABNORMAL LOW (ref 98–111)
Creatinine, Ser: 3.17 mg/dL — ABNORMAL HIGH (ref 0.61–1.24)
GFR, Estimated: 22 mL/min — ABNORMAL LOW (ref >60)
Glucose, Bld: 108 mg/dL — ABNORMAL HIGH (ref 70–99)
Potassium: 4.3 mmol/L (ref 3.5–5.1)
Sodium: 126 mmol/L — ABNORMAL LOW (ref 135–145)
Total Bilirubin: 0.8 mg/dL (ref 0.3–1.2)
Total Protein: 5.8 g/dL — ABNORMAL LOW (ref 6.5–8.1)

## 2020-12-12 LAB — HEPARIN LEVEL (UNFRACTIONATED): Heparin Unfractionated: 0.58 IU/mL (ref 0.30–0.70)

## 2020-12-12 MED ORDER — BISACODYL 10 MG RE SUPP
10.0000 mg | Freq: Every day | RECTAL | Status: DC | PRN
  Administered 2020-12-12 – 2020-12-18 (×2): 10 mg via RECTAL
  Filled 2020-12-12 (×3): qty 1

## 2020-12-12 MED ORDER — DEXAMETHASONE 4 MG PO TABS
4.0000 mg | ORAL_TABLET | Freq: Two times a day (BID) | ORAL | Status: DC
  Administered 2020-12-12 – 2020-12-18 (×11): 4 mg via ORAL
  Filled 2020-12-12 (×15): qty 1

## 2020-12-12 MED ORDER — HYDROMORPHONE HCL 1 MG/ML IJ SOLN
1.0000 mg | INTRAMUSCULAR | Status: DC | PRN
  Administered 2020-12-12 – 2020-12-13 (×2): 1 mg via INTRAVENOUS
  Filled 2020-12-12 (×2): qty 1

## 2020-12-12 NOTE — Progress Notes (Signed)
[place] for heparin Indication: acute DVT  Allergies  Allergen Reactions  . Digoxin Other (See Comments)    gynecomastia   . Phencyclidine Other (See Comments)    PCP derived antibiotic > Insomnia  . Spironolactone Other (See Comments)    Gynecomastia  . Oxybutynin Chloride     Other reaction(s): low BP  . Lactose Intolerance (Gi) Diarrhea and Other (See Comments)    cramps  . Penicillins Other (See Comments)    Cramps in stomach Did it involve swelling of the face/tongue/throat, SOB, or low BP? No Did it involve sudden or severe rash/hives, skin peeling, or any reaction on the inside of your mouth or nose? No Did you need to seek medical attention at a hospital or doctor's office? No When did it last happen?unknown If all above answers are "NO", may proceed with cephalosporin use.     . Sulfur Rash    Patient Measurements: Height: 6\' 2"  (188 cm) Weight: 109.6 kg (241 lb 9.6 oz) IBW/kg (Calculated) : 82.2 Heparin Dosing Weight: 102 kg  Vital Signs: Temp: 98.4 F (36.9 C) (01/12 0536) Temp Source: Oral (01/12 0536) BP: 123/72 (01/12 0536) Pulse Rate: 93 (01/12 0536)  Labs: Recent Labs    12/09/20 0845 12/09/20 1334 12/10/20 0449 12/10/20 2046 12/11/20 0604 12/11/20 1156 12/12/20 0616  HGB 12.9*  --   --   --  13.0  --  13.2  HCT 38.6*  --   --   --  38.1*  --  40.1  PLT 463*  --   --   --  404*  --  408*  HEPARINUNFRC  --   --   --    < > 0.56 0.51 0.58  CREATININE 2.10*   < > 2.35*  --  2.84*  --  3.17*   < > = values in this interval not displayed.    Estimated Creatinine Clearance: 33.1 mL/min (A) (by C-G formula based on SCr of 3.17 mg/dL (H)).  Assessment: Patient is a 60 y.o M with hx heart transplant, Hodgkin lymphoma and bladder cancer who was recently hospitalized from 12/30 to 1/3 for PNA.  He also underwent cystoscopy with right ureteral stent placement on 11/29/20 for right hydronephrosis and was  also found to have new retroperitoneal lymphadenopathy during this admission.  He went to see his urologist on 1/5 with c/o back pain and was sent to [facility] for further workup.  He underwent CT guided right retroperitoneal lymph node biopsy on 1/6 with retroperitoneal hemorrhage noted on post-biopsy CT. DDimer was >20 on 1/10 with LE doppler positive for acute RLE DVT. Pharmacy is consulted to start heparin drip for acute VTE.   Heparin level therapeutic on current IV heparin rate of 1750 units/hr  CBC ok  SCr rising, renal consult  No reported bleeding or issues of note  Goal of Therapy:  Heparin level 0.3-0.7 units/ml Monitor platelets by anticoagulation protocol: Yes   Plan:   Continue IV heparin at current rate of 1750 units/hr (no bolus per MD's request)  Monitor for s/sx bleeding  Daily CBC, heparin level  Kara Mead 12/12/2020,8:20 AM

## 2020-12-12 NOTE — Progress Notes (Signed)
[facility] Progress Note  Subjective: creat up 3.1, Na+ 126, CO2 21.  Main c/o remains R flank pain.   Vitals:   12/11/20 0614 12/11/20 1344 12/11/20 2112 12/12/20 0536  BP: 115/74 113/65 118/78 123/72  Pulse: 89 96 95 93  Resp: _0 Temp: 97.7 F (36.5 C) 98.4 F (36.9 C) 98.6 F (37 C) 98.4 F (36.9 C)  TempSrc: Oral Oral Oral Oral  SpO2: 94% 99% 94% 92%  Weight:      Height:        Exam: Gen alert, not in distress No jvd or bruits Chest clear bilat to bases RRR no MRG Abd protuberant, no ascites by exam, no hsm, +BS Ext 2+ pitting bilat LE from feet to hips Neuro is alert, Ox 3 , nf    Home meds:  - allopurinol 200  - synthroid 125 ug qd  - rapeprazole 40 bid/ florastor bid  - tramadol 50 qid prn/ prednisone 93m qd/  / prn flexeril  - azelastine-fluticasone qd  - prn's/ vitamins/ supplements    UA 12/04/2020 - 5 ket, 30 prot, 11-20 rbc, 11-20 wbc, 0-5 epis     UA 11/29/20  lrg Hb, small LE, neg protein, >50 rbc, 6-10 wbc    UCx no growth,  BCx's - no growth x 2    I/O complete - 5L in and 7 L out = - 2 L    Wt's 102kg admit > today 104.5 kg      Date              Creat               eGFR   2008- 16        0.9- 1.3               2017              1.1- 2.1   2018- 2020    1.3- 2.31 Aug 2020       1.2- 1.4            55-57   11/29/20        2.49                 32   12/15/2020            1.81                 43   12/08/20            2.07   12/10/20          2.35                 31     Abd CT - Urinary Tract: Fullness of the RIGHT renal pelvis with nephroureteral stent in place showing soft tissue density with distension of the renal pelvis and calices similar to the study acquired on the same date. Nephrolithiasis also unchanged. Perinephric nodularity along the medial upper pole the LEFT kidney measures 6.4 x 2.7 cm which is within 1-2 mm of its previous measurement when measured by this observer on the prior study. Increased in size when  compared to the study of November 30, 2020. Renal cortical scarring on the LEFT with nephrolithiasis in the lower pole unchanged from very recent comparison. Urinary bladder with small amount of gas in the urinary bladder with distal aspect of the stent in place,  loop coiled in the urinary bladder.  Assessment/ Plan: 1. AKI on CKD 3a - b/l creat 1.2- 1.4 from oct 2021. Renal function worsening over last few weeks. UA +wbc's and rbc's, no sig proteinuria. Imaging shows stable R ureteral stent and stable R collecting system dilatation.  Has new significant leg edema bilaterally. ECHO showed normal EF. Creat 1.8 on admission > up to 2.3 > 2.8 on 1/11 and 3.1 today.  Lasix IV tried to but dc'd due to creat rise. Suspect lymphatic obstruction d/t malignancy + low albumin causing LE edema. Do not see a treatable entity here to improve renal function or edema. Cont supportive care, hopefully kidney function will recover soon. Get labs qam, tried ted hose. Have d/w pt and questions answered.   2. High-grade bladder cancer- recent dx , treated BCG in Aug 2021, sp TURBT x 2 Aug and Sept. New RP adenopathy w/ R hydro in Dec 2021 > cysto and R ureteral stent per urology. Biopsy of RP mass done last week showed poorly diff urothelial carcinoma, molecular markers are pending. Onc consulting.  3. Edema - new bilat sig leg edema, no CHF. Failed trial of IV lasix. Supportive care.  4. H/o Hodgkin's lymphoma - remote 5. H/o cardiac transplant - taking prograf 1.5 mg /d and pred 5 /d 6. Acute RLE DVT - per primary team 7. Chronic diast CHF - had prior LVEF 10% but now has corrected to 60-65%.        Rob Schertz 12/12/2020, 2:10 PM   Recent Labs  Lab 12/11/20 0604 12/12/20 0616  K 4.6 4.3  BUN 47* 55*  CREATININE 2.84* 3.17*  CALCIUM 9.6 9.3  HGB 13.0 13.2   Inpatient medications: . allopurinol  200 mg Oral Daily  . azelastine  1 spray Each Nare Daily   And  . fluticasone  1 spray Each Nare Daily  .  dexamethasone  4 mg Oral BID WC  . docusate sodium  100 mg Oral BID  . lactulose  10 g Oral BID  . levothyroxine  125 mcg Oral Daily  . loratadine  10 mg Oral Daily  . metoCLOPramide  5 mg Oral TID AC & HS  . pantoprazole  40 mg Oral BID  . senna-docusate  2 tablet Oral BID  . sucralfate  1 g Oral BID AC & HS  . Tacrolimus ER  1.5 mg Oral Daily  . traZODone  100 mg Oral QHS   . heparin 1,750 Units/hr (12/12/20 1134)   acetaminophen **OR** acetaminophen, bisacodyl, calcium carbonate, HYDROmorphone (DILAUDID) injection, magnesium hydroxide, ondansetron **OR** ondansetron (ZOFRAN) IV       

## 2020-12-12 NOTE — Progress Notes (Signed)
PROGRESS NOTE    Glenn Gonzalez  SLH:[id number] DOB: [birth year] DOA: 12/18/2020 PCP: Lajean Manes, MD     Brief Narrative:  Glenn Gonzalez is a 60 y.o. year old male with medical history significant for Hodgkin lymphoma, cardiac transplant on tacrolimus and prednisone, chronic combined systolic/diastolic CHF, GERD, high-grade T1 bladder cancer with recent hospitalization from 15/72-6/2 for AKI complicated by hydronephrosis in the setting of new retroperitoneal lymphadenopathy. Patient underwent stent placement by urology and was treated for postoperative multifocal pneumonia.  He presented from urology office with reports of increased back pain not well controlled on oral pain medication as well as generalized fatigue, poor appetite. He was brought in under observation due to concern for back pain in the setting of known retroperitoneal lymphadenopathy.  Hospital course complicated by acute hyponatremia in the setting of poor appetite requiring IV fluids and persistent leukocytosis.  CT abdomen imaging consistent with retroperitoneal adenopathy and dilation of right renal pelvis and collecting systems concerning for urothelial neoplasm with extensive metastatic process. Found to have acute right lower extremity DVT on venous duplex started on heparin on 1/10.   New events last 24 hours / Subjective: Patient continues to feel weak, with uncontrolled pain with movement, still without bowel movement. Patient's long-standing outpatient oncologist of 40 years Dr. Rosezetta Schlatter is at bedside and able to answer questions regarding patient's history and current issues.   Assessment & Plan:   Active Problems:   SYSTOLIC HEART FAILURE, CHRONIC   Acute kidney injury superimposed on CKD (HCC)   Hypothyroidism   Hyponatremia   Back pain   Retroperitoneal lymphadenopathy   Liver lesion   Acute on chronic combined systolic and diastolic CHF (congestive heart failure) (HCC)   Acute DVT of right  tibial vein (HCC)   Retroperitoneal hemorrhage   Metastatic urothelial carcinoma (HCC)   Retroperitoneal adenopathy and perinephric nodularity -Status post IR guided retroperitoneal biopsy, pathology positive for poorly differentiated carcinoma -Urology following -Oncology following, sent for molecular testing and hoping to discuss with [facility] oncology regarding patient's options -Appreciate palliative medicine assistance with symptom management as well as constipation  AKI on CKD stage 3a -Most recent baseline creatinine 1.8 from last hospitalization, baseline creatinine 1.2-1.4 from October 2021 -Suspect lymphatic obstruction due to malignancy, low albumin -Continues to worsen without reversible treatable entity -Nephrology following  Acute right lower extremity DVT -Remains on IV heparin  Small retroperitoneal hemorrhage status post biopsy -Continue to trend CBC.  Repeat CT abdomen if drop in hemoglobin or hypotensive while on IV heparin  Acute on chronic hyponatremia -Initially improved with IV fluid and thought to be due to diminished p.o. intake but now patient is volume overloaded -Nephrology following  Small pericardial effusion -Incidentally noted on echocardiogram, without tamponade physiology  Chronic combined systolic and diastolic heart failure -Patient is status post heart transplant and EF has now corrected to 60 to 65% -Has failed trial of IV Lasix -TED hose  History of cardiac transplant -Continue tacrolimus -Change prednisone to dexamethasone per Dr. Cinda Quest recommendation as this may help with patient's energy   Hypothyroidism -Continue Synthroid     DVT prophylaxis:  Place TED hose Start: 12/11/20 1137 SCDs Start: 12/09/2020 1712  Code Status: DNR, confirmed by Dr. Sonny Dandy and myself this morning Family Communication: None at bedside Disposition Plan:  Status is: Inpatient  Remains inpatient appropriate because:Inpatient level of care appropriate due  to severity of illness   Dispo: The patient is from: Home  Anticipated d/c is to: TBD              Anticipated d/c date is: > 3 days              Patient currently is not medically stable to d/c. Awaiting further recommendation from oncology/[facility]. Patient continues to have worsening kidney function without reversible treatments. Nephrology continues to follow. Palliative care on board.     Antimicrobials:  Anti-infectives (From admission, onward)   Start     Dose/Rate Route Frequency Ordered Stop   12/18/2020 2200  cefdinir (OMNICEF) capsule 300 mg        300 mg Oral 2 times daily 12/20/2020 1716 12/10/20 2159        Objective: Vitals:   12/11/20 1344 12/11/20 2112 12/12/20 0536 12/12/20 1500  BP: 113/65 118/78 123/72 104/76  Pulse: 96 95 93 93  Resp: 12 16 16 16   Temp: 98.4 F (36.9 C) 98.6 F (37 C) 98.4 F (36.9 C) 98.7 F (37.1 C)  TempSrc: Oral Oral Oral Oral  SpO2: 99% 94% 92% 96%  Weight:      Height:        Intake/Output Summary (Last 24 hours) at 12/12/2020 1503 Last data filed at 12/12/2020 0947 Gross per 24 hour  Intake 240 ml  Output 575 ml  Net -335 ml   Filed Weights   12/10/20 0610 12/10/20 1553 12/10/20 1804  Weight: 104.5 kg 106.2 kg 109.6 kg    Examination:  General exam: Appears calm and comfortable, weak and ill-appearing Respiratory system: Clear to auscultation. Respiratory effort normal. No respiratory distress. No conversational dyspnea.  Cardiovascular system: S1 & S2 heard, RRR. No murmurs. +pedal edema. Gastrointestinal system: Abdomen is nondistended Central nervous system: Alert and oriented. No focal neurological deficits. Speech clear.  Extremities: Symmetric in appearance  Skin: No rashes, lesions or ulcers on exposed skin  Psychiatry: Judgement and insight appear normal. Mood & affect appropriate.   Data Reviewed: I have personally reviewed following labs and imaging studies  CBC: Recent Labs  Lab 12/28/2020 1923  12/06/20 0519 12/07/20 0503 12/08/20 0511 12/09/20 0845 12/11/20 0604 12/12/20 0616  WBC 19.7*   < > 17.1*  17.2* 19.3* 17.7* 20.7* 21.4*  NEUTROABS 16.6*  --   --   --   --   --   --   HGB 14.1   < > 13.9  13.7 13.1 12.9* 13.0 13.2  HCT 42.0   < > 40.8  40.6 39.0 38.6* 38.1* 40.1  MCV 94.0   < > 94.2  94.2 95.8 96.0 94.1 95.9  PLT 418*   < > 438*  440* 446* 463* 404* 408*   < > = values in this interval not displayed.   Basic Metabolic Panel: Recent Labs  Lab 12/18/2020 1923 12/06/20 0519 12/09/20 0845 12/09/20 1334 12/10/20 0449 12/11/20 0604 12/12/20 0616  NA 119*   < > 123* 127* 126* 126* 126*  K 4.2   < > 4.2 4.6 4.7 4.6 4.3  CL 82*   < > 93* 95* 95* 93* 91*  CO2 25   < > 21* 21* 20* 22 21*  GLUCOSE 106*   < > 101* 107* 94 105* 108*  BUN 32*   < > 34* 36* 39* 47* 55*  CREATININE 1.81*   < > 2.10* 2.18* 2.35* 2.84* 3.17*  CALCIUM 9.2   < > 8.7* 9.2 9.1 9.6 9.3  MG 1.7  --   --   --   --   --   --    < > =  values in this interval not displayed.   GFR: Estimated Creatinine Clearance: 33.1 mL/min (A) (by C-G formula based on SCr of 3.17 mg/dL (H)). Liver Function Tests: Recent Labs  Lab 12/16/2020 1923 12/06/20 0519 12/09/20 1334 12/10/20 0449 12/11/20 0604 12/12/20 0616  AST 22 21  --  19 19 20   ALT 12 11  --  9 10 9   ALKPHOS 58 54  --  59 60 60  BILITOT 0.7 0.9  --  0.5 0.5 0.8  PROT 6.4* 5.9*  --  5.2* 5.5* 5.8*  ALBUMIN 3.2* 3.0* 3.0* 2.6* 2.5* 2.8*   No results for input(s): LIPASE, AMYLASE in the last 168 hours. No results for input(s): AMMONIA in the last 168 hours. Coagulation Profile: No results for input(s): INR, PROTIME in the last 168 hours. Cardiac Enzymes: No results for input(s): CKTOTAL, CKMB, CKMBINDEX, TROPONINI in the last 168 hours. BNP (last 3 results) No results for input(s): PROBNP in the last 8760 hours. HbA1C: No results for input(s): HGBA1C in the last 72 hours. CBG: No results for input(s): GLUCAP in the last 168  hours. Lipid Profile: No results for input(s): CHOL, HDL, LDLCALC, TRIG, CHOLHDL, LDLDIRECT in the last 72 hours. Thyroid Function Tests: No results for input(s): TSH, T4TOTAL, FREET4, T3FREE, THYROIDAB in the last 72 hours. Anemia Panel: No results for input(s): VITAMINB12, FOLATE, FERRITIN, TIBC, IRON, RETICCTPCT in the last 72 hours. Sepsis Labs: No results for input(s): PROCALCITON, LATICACIDVEN in the last 168 hours.  Recent Results (from the past 240 hour(s))  SARS CORONAVIRUS 2 (TAT 6-24 HRS) Nasopharyngeal Nasopharyngeal Swab     Status: None   Collection Time: 12/25/2020  1:48 PM   Specimen: Nasopharyngeal Swab  Result Value Ref Range Status   SARS Coronavirus 2 NEGATIVE NEGATIVE Final    Comment: (NOTE) SARS-CoV-2 target nucleic acids are NOT DETECTED.  The SARS-CoV-2 RNA is generally detectable in upper and lower respiratory specimens during the acute phase of infection. Negative results do not preclude SARS-CoV-2 infection, do not rule out co-infections with other pathogens, and should not be used as the sole basis for treatment or other patient management decisions. Negative results must be combined with clinical observations, patient history, and epidemiological information. The expected result is Negative.  Fact Sheet for Patients: SugarRoll.be  Fact Sheet for Healthcare Providers: https://www.woods-mathews.com/  This test is not yet approved or cleared by the [place] FDA and  has been authorized for detection and/or diagnosis of SARS-CoV-2 by FDA under an Emergency Use Authorization (EUA). This EUA will remain  in effect (meaning this test can be used) for the duration of the COVID-19 declaration under Se ction 564(b)(1) of the Act, 21 U.S.C. section 360bbb-3(b)(1), unless the authorization is terminated or revoked sooner.  Performed at [facility], [place] [address] [place], [place] [address]        Radiology Studies: ECHOCARDIOGRAM LIMITED  Result Date: 12/10/2020    ECHOCARDIOGRAM LIMITED REPORT   Patient Name:   Glenn Gonzalez Date of Exam: 12/10/2020 Medical Rec #:  [id number]         Height:       74.0 in Accession #:    [id number]        Weight:       230.4 lb Date of Birth:  Apr 14, 1961        BSA:          2.308 m Patient Age:    77 years  BP:           105/72 mmHg Patient Gender: M                 HR:           99 bpm. Exam Location:  Inpatient Procedure: Limited Echo, Cardiac Doppler and Color Doppler Indications:    Acute DVT  History:        Patient has prior history of Echocardiogram examinations, most                 recent 11/30/2020. Arrythmias:Atrial Fibrillation. CKD. GERD.                 S/P heart transplant 01/22/16.  Sonographer:    Clayton Lefort RDCS (AE) Referring Phys: [id number] [facility] D NETTEY  Sonographer Comments: Technically difficult study due to poor echo windows, suboptimal apical window and suboptimal subcostal window. Unable to roll patient due to herniated discs. IMPRESSIONS  1. S/P Heart Transplant. Left ventricular ejection fraction, by estimation, is 60 to 65%. The left ventricle has normal function. Left ventricular endocardial border not optimally defined to evaluate regional wall motion. There is moderate concentric left ventricular hypertrophy. Left ventricular diastolic function could not be evaluated.  2. Right ventricular systolic function is moderately reduced. The right ventricular size is mildly enlarged. Tricuspid regurgitation signal is inadequate for assessing PA pressure.  3. A small pericardial effusion is present. The pericardial effusion is surrounding the apex.  4. Tricuspid valve regurgitation is mild to moderate. FINDINGS  Left Ventricle: S/P Heart Transplant. Left ventricular ejection fraction, by estimation, is 60 to 65%. The left ventricle has normal function. Left ventricular endocardial border not optimally defined to evaluate regional  wall motion. There is moderate concentric left ventricular hypertrophy. Left ventricular diastolic function could not be evaluated. Right Ventricle: The right ventricular size is mildly enlarged. Right ventricular systolic function is moderately reduced. Tricuspid regurgitation signal is inadequate for assessing PA pressure. Pericardium: A small pericardial effusion is present. The pericardial effusion is surrounding the apex. Tricuspid Valve: Tricuspid valve regurgitation is mild to moderate. Venous: The inferior vena cava was not well visualized. LEFT VENTRICLE PLAX 2D LVIDd:         3.60 cm LVIDs:         2.30 cm LV PW:         1.40 cm LV IVS:        1.50 cm LVOT diam:     2.50 cm LVOT Area:     4.91 cm  LEFT ATRIUM         Index LA diam:    2.80 cm 1.21 cm/m   AORTA Ao Root diam: 3.70 cm Ao Asc diam:  3.10 cm TRICUSPID VALVE TR Peak grad:   52.4 mmHg TR Vmax:        362.00 cm/s  SHUNTS Systemic Diam: 2.50 cm Fransico Him MD Electronically signed by Fransico Him MD Signature Date/Time: 12/10/2020/3:35:06 PM    Final       Scheduled Meds: . allopurinol  200 mg Oral Daily  . azelastine  1 spray Each Nare Daily   And  . fluticasone  1 spray Each Nare Daily  . dexamethasone  4 mg Oral BID WC  . docusate sodium  100 mg Oral BID  . lactulose  10 g Oral BID  . levothyroxine  125 mcg Oral Daily  . loratadine  10 mg Oral Daily  . metoCLOPramide  5 mg Oral TID AC &  HS  . pantoprazole  40 mg Oral BID  . senna-docusate  2 tablet Oral BID  . sucralfate  1 g Oral BID AC & HS  . Tacrolimus ER  1.5 mg Oral Daily  . traZODone  100 mg Oral QHS   Continuous Infusions: . heparin 1,750 Units/hr (12/12/20 1134)     LOS: 5 days      Time spent: 40 minutes   Dessa Phi, DO [facility] 12/12/2020, 3:03 PM   Available via Epic secure chat 7am-7pm After these hours, please refer to coverage provider listed on [email or web address]

## 2020-12-13 DIAGNOSIS — C801 Malignant (primary) neoplasm, unspecified: Secondary | ICD-10-CM | POA: Diagnosis not present

## 2020-12-13 DIAGNOSIS — G893 Neoplasm related pain (acute) (chronic): Secondary | ICD-10-CM

## 2020-12-13 DIAGNOSIS — R590 Localized enlarged lymph nodes: Secondary | ICD-10-CM | POA: Diagnosis not present

## 2020-12-13 DIAGNOSIS — K5903 Drug induced constipation: Secondary | ICD-10-CM | POA: Diagnosis not present

## 2020-12-13 DIAGNOSIS — C7910 Secondary malignant neoplasm of unspecified urinary organs: Secondary | ICD-10-CM | POA: Diagnosis not present

## 2020-12-13 LAB — BASIC METABOLIC PANEL
Anion gap: 14 (ref 5–15)
BUN: 65 mg/dL — ABNORMAL HIGH (ref 6–20)
CO2: 20 mmol/L — ABNORMAL LOW (ref 22–32)
Calcium: 9.2 mg/dL (ref 8.9–10.3)
Chloride: 90 mmol/L — ABNORMAL LOW (ref 98–111)
Creatinine, Ser: 3.25 mg/dL — ABNORMAL HIGH (ref 0.61–1.24)
GFR, Estimated: 21 mL/min — ABNORMAL LOW (ref >60)
Glucose, Bld: 124 mg/dL — ABNORMAL HIGH (ref 70–99)
Potassium: 4.9 mmol/L (ref 3.5–5.1)
Sodium: 124 mmol/L — ABNORMAL LOW (ref 135–145)

## 2020-12-13 LAB — CBC
HCT: 41.2 % (ref 39.0–52.0)
Hemoglobin: 13.8 g/dL (ref 13.0–17.0)
MCH: 31.8 pg (ref 26.0–34.0)
MCHC: 33.5 g/dL (ref 30.0–36.0)
MCV: 94.9 fL (ref 80.0–100.0)
Platelets: 444 10*3/uL — ABNORMAL HIGH (ref 150–400)
RBC: 4.34 MIL/uL (ref 4.22–5.81)
RDW: 15.4 % (ref 11.5–15.5)
WBC: 23.5 10*3/uL — ABNORMAL HIGH (ref 4.0–10.5)
nRBC: 0 % (ref 0.0–0.2)

## 2020-12-13 LAB — HEPARIN LEVEL (UNFRACTIONATED): Heparin Unfractionated: 0.59 IU/mL (ref 0.30–0.70)

## 2020-12-13 MED ORDER — HYDROMORPHONE HCL 1 MG/ML IJ SOLN: 1.0000 mg | INTRAMUSCULAR | Status: DC | PRN

## 2020-12-13 MED ORDER — FENTANYL 12 MCG/HR TD PT72
1.0000 | MEDICATED_PATCH | TRANSDERMAL | Status: DC
  Administered 2020-12-13: 1 via TRANSDERMAL
  Filled 2020-12-13: qty 1

## 2020-12-13 MED ORDER — SUCRALFATE 1 GM/10ML PO SUSP
1.0000 g | Freq: Two times a day (BID) | ORAL | Status: AC
  Administered 2020-12-14 – 2020-12-15 (×2): 1 g via ORAL
  Filled 2020-12-13 (×2): qty 10

## 2020-12-13 MED ORDER — HYDROMORPHONE HCL 4 MG PO TABS
4.0000 mg | ORAL_TABLET | ORAL | Status: DC | PRN
  Administered 2020-12-13: 4 mg via ORAL
  Administered 2020-12-14 (×2): 8 mg via ORAL
  Filled 2020-12-13 (×2): qty 2
  Filled 2020-12-13: qty 1

## 2020-12-13 NOTE — Evaluation (Signed)
Physical Therapy Evaluation Only Patient Details Name: Glenn Gonzalez MRN: [id number] DOB: 03-21-1961 Today's Date: 12/13/2020   History of Present Illness  Glenn Gonzalez is a 60 y.o. male with medical history of hydronephrosis, hodgkin's lymphoma, colon cancer, bladder cancer, history of heart transplant, gout, depression, hypothyroidism. presents with c/o pain pain.  Clinical Impression  Pt requiring some assistance to come to sitting EOB, rise from seated surface and take limited steps with RW. Pt with spouse working from home and good sibling support to assist as needed. Pt declines acute therapy and home therapy services, requests WC for mobility due to increased pain with standing limiting pt's duration and tolerance to stand. Pt able to take a few steps at bedside, remaining on feet for ~2 minutes max at a time before return to seated rest break. Will sign off at this time, please re-consult if needs arise.  Patient suffers from multiple medical conditions including, but not limited to, hydronephrosis, hodgkin's lymphoma, colon cancer, bladder cancer which impairs their ability to perform daily activities like transfers and ambulation in the home.  A walker alone will not resolve the issues with performing activities of daily living. A wheelchair will allow patient to safely perform daily activities.  The patient can self propel in the home or has a caregiver who can provide assistance.         Follow Up Recommendations No PT follow up;Supervision/Assistance - 24 hour    Equipment Recommendations  Wheelchair (measurements PT);Wheelchair cushion (measurements PT)    Recommendations for Other Services       Precautions / Restrictions Precautions Precautions: Fall Precaution Comments: high pain Restrictions Weight Bearing Restrictions: No      Mobility  Bed Mobility Overal bed mobility: Needs Assistance Bed Mobility: Supine to Sit  Supine to sit: Min assist  General  bed mobility comments: pt pulls on therapist's hand like bedrail to upright trunk and scoot out to EOB, increased time to complete    Transfers Overall transfer level: Needs assistance Equipment used: Rolling walker (2 wheeled);None Transfers: Sit to/from [employer] to Stand: Min guard;From elevated surface;Min assist Stand pivot transfers: Min guard  General transfer comment: min G from elevated bed, able to power up with gentle UE assisting, min A for 1 STS rep from lower seater chair, following 3 reps min G with heavy cues for quad/glute activation with  mobility; min G to pivot over to chair due to slightly unsteady without LOB  Ambulation/Gait  General Gait Details: pt able to take a few steps at bedside with RW steadying self, requests to limit time on feet due to increasing LBP- considering WC for mobility upon discharge  Stairs            Wheelchair Mobility    Modified Rankin (Stroke Patients Only)       Balance Overall balance assessment: Mild deficits observed, not formally tested        Pertinent Vitals/Pain Pain Assessment: Faces Faces Pain Scale: Hurts even more Pain Location: L abdomen, R low back Pain Descriptors / Indicators: Aching;Grimacing;Guarding Pain Intervention(s): Limited activity within patient's tolerance;Premedicated before session;Repositioned    Home Living Family/patient expects to be discharged to:: Private residence Living Arrangements: Spouse/significant other;Children Available Help at Discharge: Family;Available 24 hours/day Type of Home: House Home Access: Stairs to enter Entrance Stairs-Rails: None Entrance Stairs-Number of Steps: 2 Home Layout: One level Home Equipment: Cane - single point;Bedside commode;Shower seat      Prior Function Level of  Independence: Independent         Comments: Pt reports independent wtih ADLs, ambulation, spouse works from home, multiple siblings able to assist if  needed.     Hand Dominance        Extremity/Trunk Assessment   Upper Extremity Assessment Upper Extremity Assessment: Generalized weakness    Lower Extremity Assessment Lower Extremity Assessment: Defer to PT evaluation    Cervical / Trunk Assessment Cervical / Trunk Assessment: Kyphotic  Communication   Communication: No difficulties  Cognition Arousal/Alertness: Awake/alert Behavior During Therapy: WFL for tasks assessed/performed Overall Cognitive Status: Within Functional Limits for tasks assessed                         General Comments      Exercises     Assessment/Plan    PT Assessment Patent does not need any further PT services  PT Problem List         PT Treatment Interventions      PT Goals (Current goals can be found in the Care Plan section)  Acute Rehab PT Goals Patient Stated Goal: no acute or home therapy, little pain with quality of life PT Goal Formulation: With patient Time For Goal Achievement: 12/27/20 Potential to Achieve Goals: Fair    Frequency     Barriers to discharge        Co-evaluation PT/OT/SLP Co-Evaluation/Treatment: Yes Reason for Co-Treatment: For patient/therapist safety;To address functional/ADL transfers (co-eval, poor activity tolerance due to pain) PT goals addressed during session: Mobility/safety with mobility OT goals addressed during session: ADL's and self-care       AM-PAC PT "6 Clicks" Mobility  Outcome Measure Help needed turning from your back to your side while in a flat bed without using bedrails?: A Little Help needed moving from lying on your back to sitting on the side of a flat bed without using bedrails?: A Little Help needed moving to and from a bed to a chair (including a wheelchair)?: A Little Help needed standing up from a chair using your arms (e.g., wheelchair or bedside chair)?: A Little Help needed to walk in hospital room?: A Little Help needed climbing 3-5 steps with a  railing? : Total 6 Click Score: 16    End of Session   Activity Tolerance: Patient limited by fatigue;Patient limited by pain Patient left: in chair;with call bell/phone within reach Nurse Communication: Mobility status;Patient requests pain meds PT Visit Diagnosis: Difficulty in walking, not elsewhere classified (R26.2)    Time: 2831-5176 PT Time Calculation (min) (ACUTE ONLY): 39 min   Charges:   PT Evaluation $PT Eval Moderate Complexity: 1 Mod           Tori Joron Velis PT, DPT 12/13/20, 1:10 PM

## 2020-12-13 NOTE — Progress Notes (Signed)
[facility] for heparin Indication: acute DVT  Allergies  Allergen Reactions  . Digoxin Other (See Comments)    gynecomastia   . Phencyclidine Other (See Comments)    PCP derived antibiotic > Insomnia  . Spironolactone Other (See Comments)    Gynecomastia  . Oxybutynin Chloride     Other reaction(s): low BP  . Lactose Intolerance (Gi) Diarrhea and Other (See Comments)    cramps  . Penicillins Other (See Comments)    Cramps in stomach Did it involve swelling of the face/tongue/throat, SOB, or low BP? No Did it involve sudden or severe rash/hives, skin peeling, or any reaction on the inside of your mouth or nose? No Did you need to seek medical attention at a hospital or doctor's office? No When did it last happen?unknown If all above answers are "NO", may proceed with cephalosporin use.     . Sulfur Rash   Patient Measurements: Height: 6\' 2"  (188 cm) Weight: 109.6 kg (241 lb 9.6 oz) IBW/kg (Calculated) : 82.2 Heparin Dosing Weight: 102 kg  Vital Signs: Temp: 97.8 F (36.6 C) (01/13 0551) Temp Source: Oral (01/13 0551) BP: 118/81 (01/13 0551) Pulse Rate: 89 (01/13 0551)  Labs: Recent Labs    12/11/20 0604 12/11/20 1156 12/12/20 0616 12/13/20 0657  HGB 13.0  --  13.2 13.8  HCT 38.1*  --  40.1 41.2  PLT 404*  --  408* 444*  HEPARINUNFRC 0.56 0.51 0.58 0.59  CREATININE 2.84*  --  3.17* 3.25*    Estimated Creatinine Clearance: 32.3 mL/min (A) (by C-G formula based on SCr of 3.25 mg/dL (H)).  Assessment: Patient is a 60 y.o M with hx heart transplant, Hodgkin lymphoma and bladder cancer who was recently hospitalized from 12/30 to 1/3 for PNA.  He also underwent cystoscopy with right ureteral stent placement on 11/29/20 for right hydronephrosis and was also found to have new retroperitoneal lymphadenopathy during this admission.  He went to see his urologist on 1/5 with c/o back pain and was sent to [facility] for further  workup.  He underwent CT guided right retroperitoneal lymph node biopsy on 1/6 with retroperitoneal hemorrhage noted on post-biopsy CT. DDimer was >20 on 1/10 with LE doppler positive for acute RLE DVT. Pharmacy is consulted to start heparin drip for acute VTE- no Bolus   HL = 0.59, therapeutic on current IV heparin rate of 1750 units/hr  CBC ok  SCr rising, renal consult  No reported bleeding or issues of note  Goal of Therapy:  Heparin level 0.3-0.7 units/ml Monitor platelets by anticoagulation protocol: Yes   Plan:   Continue IV heparin at current rate of 1750 units/hr  Monitor for s/sx bleeding  Daily CBC, heparin level  Minda Ditto PharmD 12/13/2020,10:10 AM

## 2020-12-13 NOTE — Progress Notes (Signed)
[facility] Progress Note  Subjective: creat up 3.1, Na+ 126, CO2 21.  Main c/o remains R flank pain.   Vitals:   12/12/20 1500 12/12/20 2226 12/13/20 0551 12/13/20 1330  BP: 104/76 105/77 118/81 126/87  Pulse: 93 94 89 (!) 101  Resp: _0 Temp: 98.7 F (37.1 C) 97.7 F (36.5 C) 97.8 F (36.6 C) 98 F (36.7 C)  TempSrc: Oral Oral Oral Oral  SpO2: 96% 96% 95% 95%  Weight:      Height:        Exam: Gen alert, not in distress No jvd or bruits Chest clear bilat to bases RRR no MRG Abd protuberant, no ascites by exam, no hsm, +BS Ext 2+ pitting bilat LE from feet to hips Neuro is alert, Ox 3 , nf    Home meds:  - allopurinol 200  - synthroid 125 ug qd  - rapeprazole 40 bid/ florastor bid  - tramadol 50 qid prn/ prednisone 66m qd/  / prn flexeril  - azelastine-fluticasone qd  - prn's/ vitamins/ supplements    UA 12/16/2020 - 5 ket, 30 prot, 11-20 rbc, 11-20 wbc, 0-5 epis     UA 11/29/20  lrg Hb, small LE, neg protein, >50 rbc, 6-10 wbc    UCx no growth,  BCx's - no growth x 2    I/O complete - 5L in and 7 L out = - 2 L    Wt's 102kg admit > today 104.5 kg      Date              Creat               eGFR   2008- 16        0.9- 1.3               2017              1.1- 2.1   2018- 2020    1.3- 2.31 Aug 2020       1.2- 1.4            55-57   11/29/20        2.49                 32   12/04/2020            1.81                 43   12/08/20            2.07   12/10/20          2.35                 31     Abd CT - Urinary Tract: Fullness of the RIGHT renal pelvis with nephroureteral stent in place showing soft tissue density with distension of the renal pelvis and calices similar to the study acquired on the same date. Nephrolithiasis also unchanged. Perinephric nodularity along the medial upper pole the LEFT kidney measures 6.4 x 2.7 cm which is within 1-2 mm of its previous measurement when measured by this observer on the prior study. Increased in size  when compared to the study of November 30, 2020. Renal cortical scarring on the LEFT with nephrolithiasis in the lower pole unchanged from very recent comparison. Urinary bladder with small amount of gas in the urinary bladder with distal aspect of the stent in  place, loop coiled in the urinary bladder.  Assessment/ Plan: 1. AKI on CKD 3a - b/l creat 1.2- 1.4 from oct 2021. Renal function worsening over last few weeks. UA +wbc's and rbc's, no sig proteinuria. Imaging shows stable R ureteral stent and stable R collecting system dilatation.  Has new significant leg edema bilaterally. ECHO showed normal EF. Creat 1.8 on admission > up to 2.3 then 2.8  >  3.1 yest and 3.2 today.  Lasix IV was tried to but dc'd due to creat rise. Suspect lymphatic obstruction d/t malignancy + low albumin causing LE edema and lasix causing relative intravasc vol depletion. Creat may be leveling off. Cont supportive care. Repeat UA, Uosm No uremic signs, no indication for RRT.  2. Hyponatremia - Uosm up and UNa normal. Focal LE edema not responsive to lasix. Try fluid/ water restriction as tolerated.  3. High-grade bladder cancer- recent dx , treated w/ BCG in Aug 2021, sp TURBT x 2 Aug and Sept. New RP adenopathy w/ R hydro in Dec 2021 > cysto and R ureteral stent per urology. Biopsy RP mass done last week showed poorly diff urothelial carcinoma, molecular markers pending. Onc consulting w/ [facility].   4. Edema - new bilat sig leg edema, no CHF. Failed trial of IV lasix. Supportive care.  5. H/o Hodgkin's lymphoma - remote 6. H/o cardiac transplant - taking prograf 1.5 mg /d and pred 5 /d 7. Acute RLE DVT - per primary team 8. Chronic diast CHF - had prior LVEF 10% but now has corrected to 60-65%. F/ [facility] system.        Glenn Gonzalez 12/13/2020, 2:18 PM   Recent Labs  Lab 12/12/20 0616 12/13/20 0657  K 4.3 4.9  BUN 55* 65*  CREATININE 3.17* 3.25*  CALCIUM 9.3 9.2  HGB 13.2 13.8   Inpatient medications: .  allopurinol  200 mg Oral Daily  . azelastine  1 spray Each Nare Daily   And  . fluticasone  1 spray Each Nare Daily  . dexamethasone  4 mg Oral BID WC  . docusate sodium  100 mg Oral BID  . lactulose  10 g Oral BID  . levothyroxine  125 mcg Oral Daily  . loratadine  10 mg Oral Daily  . metoCLOPramide  5 mg Oral TID AC & HS  . pantoprazole  40 mg Oral BID  . senna-docusate  2 tablet Oral BID  . sucralfate  1 g Oral BID AC & HS  . Tacrolimus ER  1.5 mg Oral Daily  . traZODone  100 mg Oral QHS   . heparin 1,750 Units/hr (12/13/20 1351)   acetaminophen **OR** acetaminophen, bisacodyl, calcium carbonate, HYDROmorphone (DILAUDID) injection, magnesium hydroxide, ondansetron **OR** ondansetron (ZOFRAN) IV       

## 2020-12-13 NOTE — Progress Notes (Signed)
Daily Progress Note   Patient Name: Glenn Gonzalez       Date: 12/13/2020 DOB: 04-17-1961  Age: 60 y.o. MRN#: [id number] Attending Physician: Dessa Phi, DO Primary Care Physician: Lajean Manes, MD Admit Date: 12/28/2020  Reason for Consultation/Follow-up: Establishing goals of care and Pain control  Subjective: MAR reviewed.  He has used 2 doses of short acting medication (1 mg of IV Dilaudid) since yesterday.  I met today with Glenn Gonzalez.  We discussed pain medication and he continues to have incident pain related to sitting up and other movements.  We talked about working to transition off of IV medications this is something that cannot be done at home and we talked about options for oral medication for pain relief.  We reviewed different opioids that would be available but as he has been having the most success with Dilaudid, I recommended we convert to oral Dilaudid to see if this is effective to relieve his pain.  We talked again about the need to pretreat prior to activities that cause pain and unfortunately oral pain medication will take approximately 30 minutes to take effect.  His wife inquired about utilizing IV medications at home.  We talked about home PCA and that this theoretically possible, however it is generally something that would be managed by home hospice.  I have not been able to arrange for outpatient management of PCA locally without hospice involvement.  We discussed his concern about being discharged too soon.  He obviously wants to be at home, however, he is worried that if he is discharged before his pain regimen is figured out that he will need to come back to the hospital.  His concern is heightened by fear about being in the ED with immense amount of  COVID-patient and long ED wait times.  Length of Stay: 6  Current Medications: Scheduled Meds:  . allopurinol  200 mg Oral Daily  . azelastine  1 spray Each Nare Daily   And  . fluticasone  1 spray Each Nare Daily  . dexamethasone  4 mg Oral BID WC  . docusate sodium  100 mg Oral BID  . fentaNYL  1 patch Transdermal Q72H  . lactulose  10 g Oral BID  . levothyroxine  125 mcg Oral Daily  . loratadine  10 mg Oral  Daily  . metoCLOPramide  5 mg Oral TID AC & HS  . pantoprazole  40 mg Oral BID  . senna-docusate  2 tablet Oral BID  . sucralfate  1 g Oral BID AC & HS  . Tacrolimus ER  1.5 mg Oral Daily  . traZODone  100 mg Oral QHS    Continuous Infusions: . heparin 1,750 Units/hr (12/13/20 1351)    PRN Meds: acetaminophen **OR** acetaminophen, bisacodyl, calcium carbonate, HYDROmorphone (DILAUDID) injection, HYDROmorphone, magnesium hydroxide, ondansetron **OR** ondansetron (ZOFRAN) IV  Physical Exam        General: Alert, awake, in no acute distress.  HEENT: No JVD Lungs: Good air movement, no wheezing Abdomen:  Nondistended Ext: + LE edema Skin: Warm and dry Neuro: Grossly intact, nonfocal.   Vital Signs: BP 120/79 (BP Location: Left Arm)   Pulse 94   Temp 99.1 F (37.3 C) (Oral)   Resp 16   Ht '6\' 2"'  (1.88 m)   Wt 109.6 kg   SpO2 95%   BMI 31.02 kg/m  SpO2: SpO2: 95 % O2 Device: O2 Device: Room Air O2 Flow Rate: O2 Flow Rate (L/min): 1 L/min  Intake/output summary:   Intake/Output Summary (Last 24 hours) at 12/13/2020 2113 Last data filed at 12/13/2020 1333 Gross per 24 hour  Intake 1165.05 ml  Output --  Net 1165.05 ml   LBM: Last BM Date: 12/13/20 Baseline Weight: Weight: 102.1 kg Most recent weight: Weight: 109.6 kg       Palliative Assessment/Data:      Patient Active Problem List   Diagnosis Date Noted  . Retroperitoneal hemorrhage 12/11/2020  . Metastatic urothelial carcinoma ([place])   . Acute on chronic combined systolic and diastolic CHF  (congestive heart failure) ([place]) 12/10/2020  . Acute DVT of right tibial vein ([place]) 12/10/2020  . Retroperitoneal lymphadenopathy   . Liver lesion   . Back pain 12/29/2020  . Multifocal pneumonia 11/29/2020  . Tachycardia 11/29/2020  . Hypoxia 11/29/2020  . Hyponatremia 11/29/2020  . Sepsis ([place]) 11/29/2020  . Hydronephrosis 11/29/2020  . Iron deficiency anemia due to chronic blood loss 07/21/2019  . Onychomycosis 11/08/2018  . Sepsis due to gram-negative UTI ([place]) 02/22/2018  . Status post heart transplantation ([place]) 02/22/2018  . Acute kidney injury superimposed on CKD ([place]) 02/22/2018  . Hypothyroidism 02/22/2018  . Atrial fibrillation ([place]) [I48.91] 05/15/2016  . S/P lumbar laminectomy 01/04/2016  . Palpitations 04/11/2015  . Dyspnea 01/02/2015  . Atrial tachycardia ([place]) 09/19/2014  . Fatigue 08/10/2014  . Hypotension 10/23/2013  . Insomnia 08/04/2013  . Chronotropic incompetence 05/31/2013  . Biventricular implantable cardioverter-defibrillator BSx 05/31/2013  . Nonischemic cardiomyopathy ([place]) 10/10/2011  . SHORTNESS OF BREATH 06/20/2010  . VENTRICULAR TACHYCARDIA 06/04/2010  . SYSTOLIC HEART FAILURE, CHRONIC 06/04/2010  . DEPRESSION 03/15/2008  . VERTIGO, BENIGN PAROXYSMAL POSITION 09/23/2007  . NEOPLASM, BENIGN, SKIN, TRUNK 08/26/2007  . VIRAL URI 08/14/2007    Palliative Care Assessment & Plan   Patient Profile: 60 year old male with medical history of cardiac transplant, combined heart failure, GERD, high-grade T1 bladder cancer with AKI complicated by hydronephrosis in setting of new retroperitoneal lymphadenopathy with biopsy showing poorly differentiated carcinoma  Recommendations/Plan: Pain, cancer related: Plan for addition of oral pain medication to begin to work toward pain regimen that can be replicated at home.  We will plan for first-line pain medication of Dilaudid p.o. 4-8 mg every 3 hours as needed for moderate or severe pain.  I also left a  secondary backup of Dilaudid 1 mg  IV that can be given 45 minutes after oral medication for medications insufficient to relieve pain. A lot of his pain is being driven by incident pain and discussed with him recommendation to premedicate prior to activities that cause pain. Constipation, opioid related: Currently on scheduled senna and lactulose.  Dulcolax suppository as needed. Goals of Care and Additional Recommendations: Limitations on Scope of Treatment: Full Scope Treatment  Code Status:    Code Status Orders  (From admission, onward)         Start     Ordered   12/29/2020 1713  Full code  Continuous        12/30/2020 1716        Code Status History    Date Active Date Inactive Code Status Order ID Comments User Context   11/30/2020 0140 12/03/2020 1851 Full Code [id number]  Chotiner, Yevonne Aline, MD Inpatient   02/22/2018 1459 02/24/2018 1704 Full Code [id number]  Karmen Bongo, MD ED   04/20/2015 1145 04/20/2015 1813 Full Code [id number]  Bensimhon, Shaune Pascal, MD Inpatient   Advance Care Planning Activity      Prognosis:  Guarded  Discharge Planning: To Be Determined  Care plan was discussed with patient, Dr. Maylene Roes  Thank you for allowing the Palliative Medicine Team to assist in the care of this patient.   Time In: 1630 Time Out: 1700 Total Time 30 Prolonged Time Billed No   Greater than 50%  of this time was spent counseling and coordinating care related to the above assessment and plan.  Micheline Rough, MD  Please contact Palliative Medicine Team phone at [phone] for questions and concerns.

## 2020-12-13 NOTE — Progress Notes (Signed)
Daily Progress Note   Patient Name: Glenn Gonzalez       Date: 12/13/2020 DOB: 1961/07/09  Age: 60 y.o. MRN#: [id number] Attending Physician: Dessa Phi, DO Primary Care Physician: Lajean Manes, MD Admit Date: 12/19/2020  Reason for Consultation/Follow-up: Establishing goals of care and Pain control  Subjective: MAR reviewed.  He has not used any short acting medication for the last day and a half.  I met today with Glenn Gonzalez.  He reports feeling poorly today but has trouble verbalizing exactly what all is bothering him.  States the pain is not his primary issue and he is having more problems with nausea, fatigue, and "brain fog."  He did not take OxyContin this morning as he wanted to see if some of the symptoms improved without long-acting medication.  We discussed plan for pain management.  He is not really using much in the way of a long-acting medication, so I believe it is reasonable to try without this and focus on short acting medications to see if it does improve some of his other symptoms.  I am afraid that these are more likely related to progression of cancer rather than side effects of opioids.  We discussed awaiting word from [facility] if they have any recommendations for treatment prior to continuing conversation regarding goals of care.  Plan is for his children to be able to come and visit him over the next couple of days and he is looking forward to this.  We also discussed his constipation.  It has been a couple days since his last bowel movement.  He wants to focus on being able to spend time with family, but he may want to try suppository later this evening once they leave.  I ordered Dulcolax as needed in case he wants to try this later this evening.  Discussed that if  he does not have bowel movement by tomorrow, we will need to consider suppository versus enema.  Length of Stay: 6  Current Medications: Scheduled Meds:  . allopurinol  200 mg Oral Daily  . azelastine  1 spray Each Nare Daily   And  . fluticasone  1 spray Each Nare Daily  . dexamethasone  4 mg Oral BID WC  . docusate sodium  100 mg Oral BID  . lactulose  10 g Oral  BID  . levothyroxine  125 mcg Oral Daily  . loratadine  10 mg Oral Daily  . metoCLOPramide  5 mg Oral TID AC & HS  . pantoprazole  40 mg Oral BID  . senna-docusate  2 tablet Oral BID  . sucralfate  1 g Oral BID AC & HS  . Tacrolimus ER  1.5 mg Oral Daily  . traZODone  100 mg Oral QHS    Continuous Infusions: . heparin 1,750 Units/hr (12/13/20 0704)    PRN Meds: acetaminophen **OR** acetaminophen, bisacodyl, calcium carbonate, HYDROmorphone (DILAUDID) injection, magnesium hydroxide, ondansetron **OR** ondansetron (ZOFRAN) IV  Physical Exam        General: Alert, awake, in no acute distress.  HEENT: No JVD Lungs: Good air movement, no wheezing Abdomen:  Nondistended Ext: + LE edema Skin: Warm and dry Neuro: Grossly intact, nonfocal.   Vital Signs: BP 118/81 (BP Location: Left Arm)   Pulse 89   Temp 97.8 F (36.6 C) (Oral)   Resp 16   Ht '6\' 2"'  (1.88 m)   Wt 109.6 kg   SpO2 95%   BMI 31.02 kg/m  SpO2: SpO2: 95 % O2 Device: O2 Device: Room Air O2 Flow Rate: O2 Flow Rate (L/min): 1 L/min  Intake/output summary:   Intake/Output Summary (Last 24 hours) at 12/13/2020 0838 Last data filed at 12/13/2020 0150 Gross per 24 hour  Intake 1045.05 ml  Output 300 ml  Net 745.05 ml   LBM: Last BM Date: 12/12/20 Baseline Weight: Weight: 102.1 kg Most recent weight: Weight: 109.6 kg       Palliative Assessment/Data:      Patient Active Problem List   Diagnosis Date Noted  . Retroperitoneal hemorrhage 12/11/2020  . Metastatic urothelial carcinoma ([place])   . Acute on chronic combined systolic and  diastolic CHF (congestive heart failure) ([place]) 12/10/2020  . Acute DVT of right tibial vein ([place]) 12/10/2020  . Retroperitoneal lymphadenopathy   . Liver lesion   . Back pain 12/08/2020  . Multifocal pneumonia 11/29/2020  . Tachycardia 11/29/2020  . Hypoxia 11/29/2020  . Hyponatremia 11/29/2020  . Sepsis ([place]) 11/29/2020  . Hydronephrosis 11/29/2020  . Iron deficiency anemia due to chronic blood loss 07/21/2019  . Onychomycosis 11/08/2018  . Sepsis due to gram-negative UTI ([place]) 02/22/2018  . Status post heart transplantation ([place]) 02/22/2018  . Acute kidney injury superimposed on CKD ([place]) 02/22/2018  . Hypothyroidism 02/22/2018  . Atrial fibrillation ([place]) [I48.91] 05/15/2016  . S/P lumbar laminectomy 01/04/2016  . Palpitations 04/11/2015  . Dyspnea 01/02/2015  . Atrial tachycardia ([place]) 09/19/2014  . Fatigue 08/10/2014  . Hypotension 10/23/2013  . Insomnia 08/04/2013  . Chronotropic incompetence 05/31/2013  . Biventricular implantable cardioverter-defibrillator BSx 05/31/2013  . Nonischemic cardiomyopathy ([place]) 10/10/2011  . SHORTNESS OF BREATH 06/20/2010  . VENTRICULAR TACHYCARDIA 06/04/2010  . SYSTOLIC HEART FAILURE, CHRONIC 06/04/2010  . DEPRESSION 03/15/2008  . VERTIGO, BENIGN PAROXYSMAL POSITION 09/23/2007  . NEOPLASM, BENIGN, SKIN, TRUNK 08/26/2007  . VIRAL URI 08/14/2007    Palliative Care Assessment & Plan   Patient Profile: 60 year old male with medical history of cardiac transplant, combined heart failure, GERD, high-grade T1 bladder cancer with AKI complicated by hydronephrosis in setting of new retroperitoneal lymphadenopathy with biopsy showing poorly differentiated carcinoma  Recommendations/Plan: Pain, cancer related: He reports today that his pain is not his primary concern as he has been having increase in nausea and fatigue.  Concern if pain medications are contributing to this nausea and his fatigue.  He did not take  OxyContin this morning as he wanted  to see if he felt better without it.  Will discontinue long-acting OxyContin and focus on utilizing Dilaudid 1 mg IV every 3 hours as needed for breakthrough pain for the next day or so to see if his nausea and fatigue/"fog" improve. A lot of his pain is being driven by incident pain and discussed with him recommendation to premedicate prior to activities that cause pain.  Constipation, opioid related: Currently on scheduled senna and lactulose.  Will add Dulcolax suppository as needed as he may want to try a suppository later this evening. He reports feeling poorly overall with nausea, fatigue, and "brain fog."  Reports trouble concentrating.  This could all be cancer related.  Could consider trial of low-dose stimulant, however, he was just started on prednisone today and will wait to see how this affects prior to adding on further medications.  Goals of Care and Additional Recommendations: Limitations on Scope of Treatment: Full Scope Treatment  Code Status:    Code Status Orders  (From admission, onward)         Start     Ordered   12/28/2020 1713  Full code  Continuous        12/17/2020 1716        Code Status History    Date Active Date Inactive Code Status Order ID Comments User Context   11/30/2020 0140 12/03/2020 1851 Full Code [id number]  Chotiner, Yevonne Aline, MD Inpatient   02/22/2018 1459 02/24/2018 1704 Full Code [id number]  Karmen Bongo, MD ED   04/20/2015 1145 04/20/2015 1813 Full Code [id number]  Bensimhon, Shaune Pascal, MD Inpatient   Advance Care Planning Activity      Prognosis:  Guarded  Discharge Planning: To Be Determined  Care plan was discussed with patient, Dr. Maylene Roes  Thank you for allowing the Palliative Medicine Team to assist in the care of this patient.   Time In: 1140 Time Out: 1220 Total Time 40 Prolonged Time Billed No   Greater than 50%  of this time was spent counseling and coordinating care related to the above assessment and plan.  Micheline Rough,  MD  Please contact Palliative Medicine Team phone at [phone] for questions and concerns.

## 2020-12-13 NOTE — Progress Notes (Signed)
PROGRESS NOTE    Glenn Gonzalez  PYP:[id number] DOB: [birth year] DOA: 12/20/2020 PCP: Lajean Manes, MD     Brief Narrative:  Glenn Gonzalez is a 60 y.o. year old male with medical history significant for Hodgkin lymphoma, cardiac transplant on tacrolimus and prednisone, chronic combined systolic/diastolic CHF, GERD, high-grade T1 bladder cancer with recent hospitalization from 24/58-0/9 for AKI complicated by hydronephrosis in the setting of new retroperitoneal lymphadenopathy. Patient underwent stent placement by urology and was treated for postoperative multifocal pneumonia.  He presented from urology office with reports of increased back pain not well controlled on oral pain medication as well as generalized fatigue, poor appetite. He was brought in under observation due to concern for back pain in the setting of known retroperitoneal lymphadenopathy.  Hospital course complicated by acute hyponatremia in the setting of poor appetite requiring IV fluids and persistent leukocytosis.  CT abdomen imaging consistent with retroperitoneal adenopathy and dilation of right renal pelvis and collecting systems concerning for urothelial neoplasm with extensive metastatic process. Found to have acute right lower extremity DVT on venous duplex started on heparin on 1/10.   New events last 24 hours / Subjective: States that he feels a little bit better today, more clear headed.  Had several bowel movements yesterday  Assessment & Plan:   Active Problems:   SYSTOLIC HEART FAILURE, CHRONIC   Acute kidney injury superimposed on CKD (HCC)   Hypothyroidism   Hyponatremia   Back pain   Retroperitoneal lymphadenopathy   Liver lesion   Acute on chronic combined systolic and diastolic CHF (congestive heart failure) (HCC)   Acute DVT of right tibial vein (HCC)   Retroperitoneal hemorrhage   Metastatic urothelial carcinoma (HCC)   Retroperitoneal adenopathy and perinephric nodularity -Status post  IR guided retroperitoneal biopsy, pathology positive for poorly differentiated carcinoma -Urology following -Oncology following, sent for molecular testing and hoping to discuss with [facility] oncology regarding patient's options -Appreciate palliative medicine assistance with symptom management as well as constipation  AKI on CKD stage 3a -Most recent baseline creatinine 1.8 from last hospitalization, baseline creatinine 1.2-1.4 from October 2021 -Suspect lymphatic obstruction due to malignancy, low albumin -Continues to worsen without reversible treatable entity -Nephrology following  Acute right lower extremity DVT -Remains on IV heparin  Small retroperitoneal hemorrhage status post biopsy -Continue to trend CBC.  Repeat CT abdomen if drop in hemoglobin or hypotensive while on IV heparin  Acute on chronic hyponatremia -Initially improved with IV fluid and thought to be due to diminished p.o. intake but now patient is volume overloaded -Nephrology following  Small pericardial effusion -Incidentally noted on echocardiogram, without tamponade physiology  Chronic combined systolic and diastolic heart failure -Patient is status post heart transplant and EF has now corrected to 60 to 65% -Has failed trial of IV Lasix -TED hose  History of cardiac transplant -Continue tacrolimus -Change prednisone to dexamethasone per Dr. Cinda Quest recommendation as this may help with patient's energy  -Per patient's request, I attempted to get in touch with Dr. Eleanora Neighbor team at [facility] to discuss his cardiac transplant medications.  Despite numerous attempts, I could not get through to their answering service.  Hypothyroidism -Continue Synthroid     DVT prophylaxis:  Place TED hose Start: 12/11/20 1137 SCDs Start: 12/22/2020 1712  Code Status: DNR, confirmed by Dr. Sonny Dandy and myself  Family Communication: None at bedside Disposition Plan:  Status is: Inpatient  Remains inpatient appropriate  because:Inpatient level of care appropriate due to severity of illness  Dispo: The patient is from: Home              Anticipated d/c is to: TBD              Anticipated d/c date is: > 3 days              Patient currently is not medically stable to d/c. Awaiting further recommendation from oncology/[facility]. Patient continues to have worsening kidney function without reversible treatments. Nephrology continues to follow. Palliative care on board.  Patient really not keen on going to SNF.  PT OT evaluation is pending.     Antimicrobials:  Anti-infectives (From admission, onward)   Start     Dose/Rate Route Frequency Ordered Stop   12/28/2020 2200  cefdinir (OMNICEF) capsule 300 mg        300 mg Oral 2 times daily 12/04/2020 1716 12/10/20 2159       Objective: Vitals:   12/12/20 0536 12/12/20 1500 12/12/20 2226 12/13/20 0551  BP: 123/72 104/76 105/77 118/81  Pulse: 93 93 94 89  Resp: 16 16 16 16   Temp: 98.4 F (36.9 C) 98.7 F (37.1 C) 97.7 F (36.5 C) 97.8 F (36.6 C)  TempSrc: Oral Oral Oral Oral  SpO2: 92% 96% 96% 95%  Weight:      Height:        Intake/Output Summary (Last 24 hours) at 12/13/2020 1306 Last data filed at 12/13/2020 1020 Gross per 24 hour  Intake 1165.05 ml  Output 300 ml  Net 865.05 ml   Filed Weights   12/10/20 0610 12/10/20 1553 12/10/20 1804  Weight: 104.5 kg 106.2 kg 109.6 kg    Examination: General exam: Appears calm and comfortable, ill-appearing Respiratory system: Clear to auscultation. Respiratory effort normal. Cardiovascular system: S1 & S2 heard, RRR. + Bilateral pedal edema. Gastrointestinal system: Abdomen is mildly distended, soft and nontender. Normal bowel sounds heard. Central nervous system: Alert and oriented. Non focal exam. Speech clear  Extremities: Symmetric in appearance bilaterally  Skin: No rashes, lesions or ulcers on exposed skin  Psychiatry: Judgement and insight appear stable. Mood & affect appropriate.    Data  Reviewed: I have personally reviewed following labs and imaging studies  CBC: Recent Labs  Lab 12/08/20 0511 12/09/20 0845 12/11/20 0604 12/12/20 0616 12/13/20 0657  WBC 19.3* 17.7* 20.7* 21.4* 23.5*  HGB 13.1 12.9* 13.0 13.2 13.8  HCT 39.0 38.6* 38.1* 40.1 41.2  MCV 95.8 96.0 94.1 95.9 94.9  PLT 446* 463* 404* 408* 161*   Basic Metabolic Panel: Recent Labs  Lab 12/09/20 1334 12/10/20 0449 12/11/20 0604 12/12/20 0616 12/13/20 0657  NA 127* 126* 126* 126* 124*  K 4.6 4.7 4.6 4.3 4.9  CL 95* 95* 93* 91* 90*  CO2 21* 20* 22 21* 20*  GLUCOSE 107* 94 105* 108* 124*  BUN 36* 39* 47* 55* 65*  CREATININE 2.18* 2.35* 2.84* 3.17* 3.25*  CALCIUM 9.2 9.1 9.6 9.3 9.2   GFR: Estimated Creatinine Clearance: 32.3 mL/min (A) (by C-G formula based on SCr of 3.25 mg/dL (H)). Liver Function Tests: Recent Labs  Lab 12/09/20 1334 12/10/20 0449 12/11/20 0604 12/12/20 0616  AST  --  19 19 20   ALT  --  9 10 9   ALKPHOS  --  59 60 60  BILITOT  --  0.5 0.5 0.8  PROT  --  5.2* 5.5* 5.8*  ALBUMIN 3.0* 2.6* 2.5* 2.8*   No results for input(s): LIPASE, AMYLASE in the last 168 hours. No results  for input(s): AMMONIA in the last 168 hours. Coagulation Profile: No results for input(s): INR, PROTIME in the last 168 hours. Cardiac Enzymes: No results for input(s): CKTOTAL, CKMB, CKMBINDEX, TROPONINI in the last 168 hours. BNP (last 3 results) No results for input(s): PROBNP in the last 8760 hours. HbA1C: No results for input(s): HGBA1C in the last 72 hours. CBG: No results for input(s): GLUCAP in the last 168 hours. Lipid Profile: No results for input(s): CHOL, HDL, LDLCALC, TRIG, CHOLHDL, LDLDIRECT in the last 72 hours. Thyroid Function Tests: No results for input(s): TSH, T4TOTAL, FREET4, T3FREE, THYROIDAB in the last 72 hours. Anemia Panel: No results for input(s): VITAMINB12, FOLATE, FERRITIN, TIBC, IRON, RETICCTPCT in the last 72 hours. Sepsis Labs: No results for input(s):  PROCALCITON, LATICACIDVEN in the last 168 hours.  Recent Results (from the past 240 hour(s))  SARS CORONAVIRUS 2 (TAT 6-24 HRS) Nasopharyngeal Nasopharyngeal Swab     Status: None   Collection Time: 12/07/2020  1:48 PM   Specimen: Nasopharyngeal Swab  Result Value Ref Range Status   SARS Coronavirus 2 NEGATIVE NEGATIVE Final    Comment: (NOTE) SARS-CoV-2 target nucleic acids are NOT DETECTED.  The SARS-CoV-2 RNA is generally detectable in upper and lower respiratory specimens during the acute phase of infection. Negative results do not preclude SARS-CoV-2 infection, do not rule out co-infections with other pathogens, and should not be used as the sole basis for treatment or other patient management decisions. Negative results must be combined with clinical observations, patient history, and epidemiological information. The expected result is Negative.  Fact Sheet for Patients: SugarRoll.be  Fact Sheet for Healthcare Providers: https://www.woods-mathews.com/  This test is not yet approved or cleared by the [place] FDA and  has been authorized for detection and/or diagnosis of SARS-CoV-2 by FDA under an Emergency Use Authorization (EUA). This EUA will remain  in effect (meaning this test can be used) for the duration of the COVID-19 declaration under Se ction 564(b)(1) of the Act, 21 U.S.C. section 360bbb-3(b)(1), unless the authorization is terminated or revoked sooner.  Performed at [facility], [place] [address] [place], [place] [address]       Radiology Studies: No results found.    Scheduled Meds: . allopurinol  200 mg Oral Daily  . azelastine  1 spray Each Nare Daily   And  . fluticasone  1 spray Each Nare Daily  . dexamethasone  4 mg Oral BID WC  . docusate sodium  100 mg Oral BID  . lactulose  10 g Oral BID  . levothyroxine  125 mcg Oral Daily  . loratadine  10 mg Oral Daily  . metoCLOPramide  5 mg Oral TID  AC & HS  . pantoprazole  40 mg Oral BID  . senna-docusate  2 tablet Oral BID  . sucralfate  1 g Oral BID AC & HS  . Tacrolimus ER  1.5 mg Oral Daily  . traZODone  100 mg Oral QHS   Continuous Infusions: . heparin 1,750 Units/hr (12/13/20 0704)     LOS: 6 days      Time spent: 50 minutes   Dessa Phi, DO [facility] 12/13/2020, 1:06 PM   Available via Epic secure chat 7am-7pm After these hours, please refer to coverage provider listed on [email or web address]

## 2020-12-13 NOTE — Progress Notes (Addendum)
HEMATOLOGY-ONCOLOGY PROGRESS NOTE  SUBJECTIVE: Continues to have pain.  Palliative care is following and assisting with management of pain medication.  Offers no other complaints today.  REVIEW OF SYSTEMS:   Constitutional: Denies fevers, chills Eyes: Denies blurriness of vision Ears, nose, mouth, throat, and face: Denies mucositis or sore throat Respiratory: Denies cough, dyspnea or wheezes Cardiovascular: Denies palpitation, chest discomfort Gastrointestinal:  Denies nausea, heartburn or change in bowel habits Skin: Denies abnormal skin rashes Lymphatics: Denies new lymphadenopathy or easy bruising Neurological:Denies numbness, tingling or new weaknesses Behavioral/Psych: Mood is stable, no new changes  Extremities: No lower extremity edema All other systems were reviewed with the patient and are negative.  I have reviewed the past medical history, past surgical history, social history and family history with the patient and they are unchanged from previous note.   PHYSICAL EXAMINATION: ECOG PERFORMANCE STATUS: 3 - Symptomatic, >50% confined to bed  Vitals:   12/13/20 0551 12/13/20 1330  BP: 118/81 126/87  Pulse: 89 (!) 101  Resp: 16 18  Temp: 97.8 F (36.6 C) 98 F (36.7 C)  SpO2: 95% 95%   Filed Weights   12/10/20 0610 12/10/20 1553 12/10/20 1804  Weight: 104.5 kg 106.2 kg 109.6 kg    Intake/Output from previous day: 01/12 0701 - 01/13 0700 In: 1045.1 [P.O.:240; I.V.:805.1] Out: 300 [Urine:300]  GENERAL:alert, no distress and comfortable SKIN: skin color, texture, turgor are normal, no rashes or significant lesions EYES: normal, Conjunctiva are pink and non-injected, sclera clear LUNGS: clear to auscultation and percussion with normal breathing effort HEART: regular rate & rhythm and no murmurs and no lower extremity edema ABDOMEN:abdomen soft, non-tender and normal bowel sounds Musculoskeletal:no cyanosis of digits and no clubbing  NEURO: alert & oriented x 3  with fluent speech, no focal motor/sensory deficits  LABORATORY DATA:  I have reviewed the data as listed CMP Latest Ref Rng & Units 12/13/2020 12/12/2020 12/11/2020  Glucose 70 - 99 mg/dL 124(H) 108(H) 105(H)  BUN 6 - 20 mg/dL 65(H) 55(H) 47(H)  Creatinine 0.61 - 1.24 mg/dL 3.25(H) 3.17(H) 2.84(H)  Sodium 135 - 145 mmol/L 124(L) 126(L) 126(L)  Potassium 3.5 - 5.1 mmol/L 4.9 4.3 4.6  Chloride 98 - 111 mmol/L 90(L) 91(L) 93(L)  CO2 22 - 32 mmol/L 20(L) 21(L) 22  Calcium 8.9 - 10.3 mg/dL 9.2 9.3 9.6  Total Protein 6.5 - 8.1 g/dL - 5.8(L) 5.5(L)  Total Bilirubin 0.3 - 1.2 mg/dL - 0.8 0.5  Alkaline Phos 38 - 126 U/L - 60 60  AST 15 - 41 U/L - 20 19  ALT 0 - 44 U/L - 9 10    Lab Results  Component Value Date   WBC 23.5 (H) 12/13/2020   HGB 13.8 12/13/2020   HCT 41.2 12/13/2020   MCV 94.9 12/13/2020   PLT 444 (H) 12/13/2020   NEUTROABS 16.6 (H) 12/10/2020    CT ABDOMEN PELVIS WO CONTRAST  Result Date: 12/03/2020 CLINICAL DATA:  Cancer of unknown primary in this 60 year old male. EXAM: CT CHEST, ABDOMEN AND PELVIS WITHOUT CONTRAST TECHNIQUE: Multidetector CT imaging of the chest, abdomen and pelvis was performed following the standard protocol without IV contrast. COMPARISON:  CT abdomen and pelvis of the same date. Also with CT of December 30th of 2021 FINDINGS: CT CHEST FINDINGS Cardiovascular: Calcified atheromatous plaque in the thoracic aorta. Heart size is normal following sternotomy for heart transplant by report a [facility]. Three-vessel coronary artery calcification. No pericardial effusion. Calcifications in the LEFT atrium partially  seen on previous imaging are of uncertain significance central pulmonary vasculature also with some signs of calcification. Mediastinum/Nodes: Ovoid structure associated with LEFT thyroid 11 mm similar to previous imaging, cervical spine from 2017. Thyroid as described above. Calcifications in the LEFT supraclavicular region may be  within venous structures on the LEFT. No axillary lymphadenopathy. Surgical clips in the RIGHT axilla and soft tissue thickening over the LEFT pectoralis musculature similar grossly compared to remote MRI evaluation 2018 and surgical clips seen on numerous priors in the RIGHT axilla. No mediastinal lymphadenopathy. No gross hilar lymphadenopathy. Lungs/Pleura: Small RIGHT of pleural effusion and basilar airspace disease. Small nodule in the superior aspect of the RIGHT middle lobe approximately 3 mm on image 57 of series 5. Biapical scarring. Small nodule in the LEFT upper lobe 6 mm (image 51, series 5) airways are patent. Musculoskeletal: Evidence ir sternotomy without significant bony bridging but with sclerotic margins along the sternotomy line similar to studies dating back to August of 2021 with respect to visualized portions on prior abdomen studies no destructive bone finding or acute bone process. CT ABDOMEN PELVIS FINDINGS Hepatobiliary: 1.9 cm lesion in the RIGHT hepatic lobe near the dome of the RIGHT hemi liver does not display attenuation values of simple cyst is unchanged from the study acquired on the same date at another facility. Additionally a 1.4 cm lesion in the RIGHT hepatic lobe on image 23 of series 2 is unchanged. Sludge in the gallbladder. Tiny low-density foci in the LEFT hepatic lobe without change 1 on image 19 of series 2 in the lateral segment measuring 5 mm and another in the medial segment of the LEFT hepatic lobe measuring 12 mm on image 21 of series 2 these areas appears similar dating back to August of 2020, lesions in the RIGHT hepatic lobe are new. Pancreas: Normal pancreatic contour without signs of inflammation. Spleen: Post splenectomy. Adrenals/Urinary Tract: Adrenal glands, normal on the LEFT and obscured on the RIGHT by perinephric and Peri adrenal nodularity. Fullness of the RIGHT renal pelvis with nephroureteral stent in place showing soft tissue density with distension  of the renal pelvis and calices similar to the study acquired on the same date. Nephrolithiasis also unchanged. Perinephric nodularity along the medial upper pole the LEFT kidney measures 6.4 x 2.7 cm which is within 1-2 mm of its previous measurement when measured by this observer on the prior study. Increased in size when compared to the study of November 30, 2020. Renal cortical scarring on the LEFT with nephrolithiasis in the lower pole unchanged from very recent comparison. Urinary bladder with small amount of gas in the urinary bladder with distal aspect of the stent in place, loop coiled in the urinary bladder. Stomach/Bowel: Scattered colonic diverticulosis without acute gastrointestinal process. Normal appendix. Stool and contrast throughout the colon. Vascular/Lymphatic: Calcified atheromatous plaque in the abdominal aorta without aneurysmal dilation. Retroperitoneal adenopathy unchanged from scan earlier the same day, largest lymph nodes behind the inferior vena cava and in the intra-aortocaval groove and in the upper abdomen, largest on image 41 of series 2 measuring 1.9 cm. Smaller lymph nodes along the LEFT periaortic chain. Postoperative changes in the retroperitoneum associated with previous lymphadenectomy No pelvic lymphadenopathy. Reproductive: Prostate with uro lift device deployment bilaterally similar to recent priors. Other: Extensive retroperitoneal fascial thickening/stranding extending into the root of the small bowel mesentery, nodular changes associated with this thickening on the RIGHT outlining the renal fascia this crosses the midline where it is without significant nodularity but  with fascial thickening and stranding, also tracking into the pelvis. Small amount of fluid in the pelvis. Musculoskeletal: Spinal degenerative changes. No destructive bone process or acute bone finding. IMPRESSION: 1. Retroperitoneal adenopathy and perinephric nodularity with dilation of the RIGHT renal  pelvis and collecting systems, filled with what is suspected to represent neoplasm. 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 Differential considerations given heart transplantation and presumed immunosuppression would include posttransplant lymphoproliferative disorder/lymphoma. 2. Dense material in collecting systems may represent a mixture of tumor and or blood and there is profound dilation of collecting systems of the RIGHT kidney. This is unchanged compared to the recent comparison study. 3. Signs of hepatic involvement with new lesions in the RIGHT hepatic lobe. 4. Ascites with small amount of ascites with very subtle infiltration of fat in the omentum not mentioned above, for instance on image 50 of series 2 raising the question of peritoneal involvement as well. This area is in the range of 2-3 mm. 5. Small RIGHT pleural effusion and basilar airspace disease. 6. Postoperative changes about the chest likely related to prior heart transplantation with surgical clips in the RIGHT axilla and thickening and calcification along the anterior pectoralis musculature not changed since 2018. 7. Calcification in the LEFT atrium also about the aortic root and pulmonary artery and to a lesser degree in the RIGHT atrium likely reflects changes of vascular anastomotic sites in the setting of heart transplantation. 8. 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 9. Post splenectomy. 10. Aortic atherosclerosis. 11. Small nodule in the LEFT upper lobe 6 mm airways are patent. These results will be called to the ordering clinician or representative by the Radiologist Assistant, and communication documented in the PACS or [employer]. Aortic Atherosclerosis (ICD10-I70.0). Electronically Signed   By: Zetta Bills M.D.   On: 12/06/2020 17:38   DG  Chest 1 View  Result Date: 11/29/2020 CLINICAL DATA:  Hypoxia EXAM: CHEST  1 VIEW COMPARISON:  08/03/2020 FINDINGS: Since the prior examination, there has developed right basilar consolidation, possibly infectious in the appropriate clinical setting. Mild focal infiltrate is also noted within the right apex. Left lung is clear. No pneumothorax or pleural effusion. Median sternotomy has been performed. Cardiac size within normal limits. Multiple healed right rib fractures are noted. Multiple surgical clips are seen within the right axilla. IMPRESSION: Interval development of multifocal pulmonary infiltrate within the right lung, more focal within the right lung base, suspicious for atypical infection in the acute setting. Electronically Signed   By: Fidela Salisbury MD   On: 11/29/2020 22:27   DG Chest 2 View  Result Date: 12/24/2020 CLINICAL DATA:  Bladder cancer.  Follow-up multifocal pneumonia. EXAM: CHEST - 2 VIEW COMPARISON:  11/29/2020 FINDINGS: Prior median sternotomy. Heart is normal size. Patchy right lung airspace disease in the right apex and right lower lung. Minimal left base linear atelectasis or scarring. Overall aeration in the right lung slightly improved. No effusions. IMPRESSION: Patchy right lung airspace disease with slight improvement since prior study. Left base atelectasis or scarring. Electronically Signed   By: Rolm Baptise M.D.   On: 12/24/2020 13:25   CT CHEST WO CONTRAST  Result Date: 12/26/2020 CLINICAL DATA:  Cancer of unknown primary in this 60 year old male. EXAM: CT CHEST, ABDOMEN AND PELVIS WITHOUT CONTRAST TECHNIQUE: Multidetector CT imaging of the chest, abdomen and pelvis was performed following the  standard protocol without IV contrast. COMPARISON:  CT abdomen and pelvis of the same date. Also with CT of December 30th of 2021 FINDINGS: CT CHEST FINDINGS Cardiovascular: Calcified atheromatous plaque in the thoracic aorta. Heart size is normal following sternotomy for heart  transplant by report a [facility] center. Three-vessel coronary artery calcification. No pericardial effusion. Calcifications in the LEFT atrium partially seen on previous imaging are of uncertain significance central pulmonary vasculature also with some signs of calcification. Mediastinum/Nodes: Ovoid structure associated with LEFT thyroid 11 mm similar to previous imaging, cervical spine from 2017. Thyroid as described above. Calcifications in the LEFT supraclavicular region may be within venous structures on the LEFT. No axillary lymphadenopathy. Surgical clips in the RIGHT axilla and soft tissue thickening over the LEFT pectoralis musculature similar grossly compared to remote MRI evaluation 2018 and surgical clips seen on numerous priors in the RIGHT axilla. No mediastinal lymphadenopathy. No gross hilar lymphadenopathy. Lungs/Pleura: Small RIGHT of pleural effusion and basilar airspace disease. Small nodule in the superior aspect of the RIGHT middle lobe approximately 3 mm on image 57 of series 5. Biapical scarring. Small nodule in the LEFT upper lobe 6 mm (image 51, series 5) airways are patent. Musculoskeletal: Evidence ir sternotomy without significant bony bridging but with sclerotic margins along the sternotomy line similar to studies dating back to August of 2021 with respect to visualized portions on prior abdomen studies no destructive bone finding or acute bone process. CT ABDOMEN PELVIS FINDINGS Hepatobiliary: 1.9 cm lesion in the RIGHT hepatic lobe near the dome of the RIGHT hemi liver does not display attenuation values of simple cyst is unchanged from the study acquired on the same date at another facility. Additionally a 1.4 cm lesion in the RIGHT hepatic lobe on image 23 of series 2 is unchanged. Sludge in the gallbladder. Tiny low-density foci in the LEFT hepatic lobe without change 1 on image 19 of series 2 in the lateral segment measuring 5 mm and another in the medial segment of  the LEFT hepatic lobe measuring 12 mm on image 21 of series 2 these areas appears similar dating back to August of 2020, lesions in the RIGHT hepatic lobe are new. Pancreas: Normal pancreatic contour without signs of inflammation. Spleen: Post splenectomy. Adrenals/Urinary Tract: Adrenal glands, normal on the LEFT and obscured on the RIGHT by perinephric and Peri adrenal nodularity. Fullness of the RIGHT renal pelvis with nephroureteral stent in place showing soft tissue density with distension of the renal pelvis and calices similar to the study acquired on the same date. Nephrolithiasis also unchanged. Perinephric nodularity along the medial upper pole the LEFT kidney measures 6.4 x 2.7 cm which is within 1-2 mm of its previous measurement when measured by this observer on the prior study. Increased in size when compared to the study of November 30, 2020. Renal cortical scarring on the LEFT with nephrolithiasis in the lower pole unchanged from very recent comparison. Urinary bladder with small amount of gas in the urinary bladder with distal aspect of the stent in place, loop coiled in the urinary bladder. Stomach/Bowel: Scattered colonic diverticulosis without acute gastrointestinal process. Normal appendix. Stool and contrast throughout the colon. Vascular/Lymphatic: Calcified atheromatous plaque in the abdominal aorta without aneurysmal dilation. Retroperitoneal adenopathy unchanged from scan earlier the same day, largest lymph nodes behind the inferior vena cava and in the intra-aortocaval groove and in the upper abdomen, largest on image 41 of series 2 measuring 1.9 cm. Smaller lymph nodes along the LEFT  periaortic chain. Postoperative changes in the retroperitoneum associated with previous lymphadenectomy No pelvic lymphadenopathy. Reproductive: Prostate with uro lift device deployment bilaterally similar to recent priors. Other: Extensive retroperitoneal fascial thickening/stranding extending into the root  of the small bowel mesentery, nodular changes associated with this thickening on the RIGHT outlining the renal fascia this crosses the midline where it is without significant nodularity but with fascial thickening and stranding, also tracking into the pelvis. Small amount of fluid in the pelvis. Musculoskeletal: Spinal degenerative changes. No destructive bone process or acute bone finding. IMPRESSION: 1. Retroperitoneal adenopathy and perinephric nodularity with dilation of the RIGHT renal pelvis and collecting systems, filled with what is suspected to represent neoplasm. 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 Differential considerations given heart transplantation and presumed immunosuppression would include posttransplant lymphoproliferative disorder/lymphoma. 2. Dense material in collecting systems may represent a mixture of tumor and or blood and there is profound dilation of collecting systems of the RIGHT kidney. This is unchanged compared to the recent comparison study. 3. Signs of hepatic involvement with new lesions in the RIGHT hepatic lobe. 4. Ascites with small amount of ascites with very subtle infiltration of fat in the omentum not mentioned above, for instance on image 50 of series 2 raising the question of peritoneal involvement as well. This area is in the range of 2-3 mm. 5. Small RIGHT pleural effusion and basilar airspace disease. 6. Postoperative changes about the chest likely related to prior heart transplantation with surgical clips in the RIGHT axilla and thickening and calcification along the anterior pectoralis musculature not changed since 2018. 7. Calcification in the LEFT atrium also about the aortic root and pulmonary artery and to a lesser degree in the RIGHT atrium likely reflects changes of vascular anastomotic sites in the setting of heart transplantation. 8. 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 9. Post splenectomy. 10. Aortic atherosclerosis. 11. Small nodule in the LEFT upper lobe 6 mm airways are patent. These results will be called to the ordering clinician or representative by the Radiologist Assistant, and communication documented in the PACS or [employer]. Aortic Atherosclerosis (ICD10-I70.0). Electronically Signed   By: Zetta Bills M.D.   On: 12/12/2020 17:38   NM Pulmonary Perfusion  Result Date: 12/08/2020 CLINICAL DATA:  Respiratory failure EXAM: NUCLEAR MEDICINE PERFUSION LUNG SCAN TECHNIQUE: Perfusion images were obtained in multiple projections after intravenous injection of radiopharmaceutical. Ventilation scans intentionally deferred if perfusion scan and chest x-ray adequate for interpretation during COVID 19 epidemic. RADIOPHARMACEUTICALS:  4.1 mCi Tc-12m MAA IV COMPARISON:  Chest x-ray 12/29/2020 FINDINGS: Slightly heterogeneous distribution of radiotracer within the lung fields. Multiple small bilateral wedge-shaped perfusion defects. No corresponding radiographic abnormality. No large mismatched segmental perfusion defect. IMPRESSION: Intermediate probability for pulmonary embolism. Electronically Signed   By: Davina Poke D.O.   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 ANESTHESIA/SEDATION: Fentanyl 100 mcg IV; Versed 4 mg IV Sedation time: 14 minutes; The patient was continuously monitored during the procedure by the interventional radiology nurse under my direct supervision. CONTRAST:  None. COMPLICATIONS: SIR Level A - No therapy, no consequence. Retroperitoneal hemorrhage at site of biopsy, controlled with Gel-Foam slurry administration along  needle track. PROCEDURE: Informed consent  was obtained from the patient following an explanation of the procedure, risks, benefits and alternatives. A time out was performed prior to the initiation of the procedure. The patient was positioned in a partial left lateral decubitus position on the CT table and a limited CT was performed for procedural planning demonstrating similar appearing multifocal right retroperitoneal lymphadenopathy. The procedure was planned. The operative site was prepped and draped in the usual sterile fashion. Appropriate trajectory was confirmed with a 22 gauge spinal needle after the adjacent tissues were anesthetized with 1% Lidocaine with epinephrine. Under intermittent CT guidance, a 17 gauge coaxial needle was advanced into the peripheral aspect of the mass. Appropriate positioning was confirmed and a total of 4 samples were obtained with an 18 gauge core needle biopsy device. A limited CT demonstrated focal hemorrhage about the biopsied lymph node. Gel-Foam slurry was injected through the introducer needle along the needle track. The co-axial needle was removed and hemostasis was achieved with manual compression. Additional limited postprocedural CT was negative for expanding hemorrhage or additional complication. A dressing was placed. The patient tolerated the procedure well without immediate postprocedural complication. IMPRESSION: Technically successful CT guided core needle biopsy of right retroperitoneal lymph node. Ruthann Cancer, MD [facility] Electronically Signed   By: Ruthann Cancer MD   On: 12/07/2020 08:53   DG CHEST PORT 1 VIEW  Result Date: 12/10/2020 CLINICAL DATA:  Hypoxia.  History of cardiac transplant EXAM: PORTABLE CHEST 1 VIEW COMPARISON:  12/18/2020 FINDINGS: Prior median sternotomy. Heart size within normal limits. Atherosclerotic calcification of the aortic knob. Subtle right lower lobe opacity,  slightly improved from prior. Trace right pleural effusion. No pneumothorax. IMPRESSION: 1. Subtle right lower lobe opacity, slightly improved from prior. 2. Trace right pleural effusion. Electronically Signed   By: Davina Poke D.O.   On: 12/10/2020 10:33   DG C-Arm 1-60 Min-No Report  Result Date: 11/29/2020 Fluoroscopy was utilized by the requesting physician.  No radiographic interpretation.   ECHOCARDIOGRAM COMPLETE  Result Date: 11/30/2020    ECHOCARDIOGRAM REPORT   Patient Name:   Glenn Gonzalez Date of Exam: 11/30/2020 Medical Rec #:  [id number]         Height:       74.0 in Accession #:    [id number]        Weight:       221.0 lb Date of Birth:  [birth year]        BSA:          2.268 m Patient Age:    60 years          BP:           103/59 mmHg Patient Gender: M                 HR:           94 bpm. Exam Location:  Inpatient Procedure: 2D Echo, Cardiac Doppler, Color Doppler and Intracardiac            Opacification Agent Indications:    Dyspnea R06.00  History:        Patient has prior history of Echocardiogram examinations, most                 recent 05/12/2017. Arrythmias:Atrial Fibrillation; Risk                 Factors:Non-Smoker. GERD. 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  Sonographer:    Vickie Epley RDCS Referring Phys: [id number] [facility]  S CHOTINER  Sonographer Comments: Suboptimal apical window and suboptimal subcostal window. Pt unable to turn on side due to herniated discs. IMPRESSIONS  1. Left ventricular ejection fraction, by estimation, is 60 to 65%. The left ventricle has normal function. The left ventricle has no regional wall motion abnormalities. Left ventricular diastolic parameters were normal.  2. Right ventricular systolic function is normal. The right ventricular size is normal. There is normal pulmonary artery systolic pressure. The estimated right ventricular systolic pressure is 14.9 mmHg.  3. The mitral valve is normal in structure. No evidence of mitral valve regurgitation.  No evidence of mitral stenosis.  4. The aortic valve is normal in structure. Aortic valve regurgitation is not visualized. No aortic stenosis is present.  5. The inferior vena cava is normal in size with greater than 50% respiratory variability, suggesting right atrial pressure of 3 mmHg. FINDINGS  Left Ventricle: Left ventricular ejection fraction, by estimation, is 60 to 65%. The left ventricle has normal function. The left ventricle has no regional wall motion abnormalities. Definity contrast agent was given IV to delineate the left ventricular  endocardial borders. The left ventricular internal cavity size was normal in size. There is no left ventricular hypertrophy. Left ventricular diastolic parameters were normal. Normal left ventricular filling pressure. Right Ventricle: The right ventricular size is normal. No increase in right ventricular wall thickness. Right ventricular systolic function is normal. There is normal pulmonary artery systolic pressure. The tricuspid regurgitant velocity is 2.39 m/s, and  with an assumed right atrial pressure of 3 mmHg, the estimated right ventricular systolic pressure is 70.2 mmHg. Left Atrium: Left atrial size was normal in size. Right Atrium: Right atrial size was normal in size. Pericardium: There is no evidence of pericardial effusion. Mitral Valve: The mitral valve is normal in structure. No evidence of mitral valve regurgitation. No evidence of mitral valve stenosis. Tricuspid Valve: The tricuspid valve is normal in structure. Tricuspid valve regurgitation is mild . No evidence of tricuspid stenosis. Aortic Valve: The aortic valve is normal in structure. Aortic valve regurgitation is not visualized. No aortic stenosis is present. Pulmonic Valve: The pulmonic valve was normal in structure. Pulmonic valve regurgitation is not visualized. No evidence of pulmonic stenosis. Aorta: The aortic root is normal in size and structure. Venous: The inferior vena cava is normal in  size with greater than 50% respiratory variability, suggesting right atrial pressure of 3 mmHg. IAS/Shunts: No atrial level shunt detected by color flow Doppler.  LEFT VENTRICLE PLAX 2D LVIDd:         4.40 cm     Diastology LVIDs:         3.20 cm     LV e' medial:    10.00 cm/s LV PW:         0.90 cm     LV E/e' medial:  7.5 LV IVS:        0.90 cm     LV e' lateral:   11.30 cm/s LVOT diam:     2.50 cm     LV E/e' lateral: 6.7 LV SV:         48 LV SV Index:   21 LVOT Area:     4.91 cm  LV Volumes (MOD) LV vol d, MOD A4C: 80.2 ml LV vol s, MOD A4C: 30.4 ml LV SV MOD A4C:     80.2 ml LEFT ATRIUM           Index LA diam:  2.70 cm 1.19 cm/m LA Vol (A4C): 16.3 ml 7.19 ml/m  AORTIC VALVE LVOT Vmax:   50.20 cm/s LVOT Vmean:  36.700 cm/s LVOT VTI:    0.099 m  AORTA Ao Root diam: 3.40 cm MITRAL VALVE               TRICUSPID VALVE MV Area (PHT): 5.38 cm    TR Peak grad:   22.8 mmHg MV Decel Time: 141 msec    TR Vmax:        239.00 cm/s MV E velocity: 75.40 cm/s MV A velocity: 36.00 cm/s  SHUNTS MV E/A ratio:  2.09        Systemic VTI:  0.10 m                            Systemic Diam: 2.50 cm Dani Gobble Croitoru MD Electronically signed by Sanda Klein MD Signature Date/Time: 11/30/2020/12:30:40 PM    Final    VAS [place] LOWER EXTREMITY VENOUS (DVT)  Result Date: 12/10/2020  Lower Venous DVT Study Indications: Pain.  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 Comparison Study: Previous 12/02/20 Negative Performing Technologist: Vonzell Schlatter RVT  Examination Guidelines: A complete evaluation includes B-mode imaging, spectral Doppler, color Doppler, and power Doppler as needed of all accessible portions of each vessel. Bilateral testing is considered an integral part of a complete examination. Limited examinations for reoccurring indications may be performed as noted. The reflux portion of the exam is performed with the patient in reverse Trendelenburg.   +---------+---------------+---------+-----------+----------+--------------+ RIGHT    CompressibilityPhasicitySpontaneityPropertiesThrombus Aging +---------+---------------+---------+-----------+----------+--------------+ CFV      Full           Yes      Yes                                 +---------+---------------+---------+-----------+----------+--------------+ SFJ      Full                                                        +---------+---------------+---------+-----------+----------+--------------+ FV Prox  Full                                                        +---------+---------------+---------+-----------+----------+--------------+ FV Mid   Full                                                        +---------+---------------+---------+-----------+----------+--------------+ FV DistalFull                                                        +---------+---------------+---------+-----------+----------+--------------+ PFV      Full                                                        +---------+---------------+---------+-----------+----------+--------------+  POP      Full           Yes      Yes                                 +---------+---------------+---------+-----------+----------+--------------+ PTV      None                                                        +---------+---------------+---------+-----------+----------+--------------+ PERO     None                                                        +---------+---------------+---------+-----------+----------+--------------+   +---------+---------------+---------+-----------+----------+--------------+ LEFT     CompressibilityPhasicitySpontaneityPropertiesThrombus Aging +---------+---------------+---------+-----------+----------+--------------+ CFV      Full           Yes      Yes                                  +---------+---------------+---------+-----------+----------+--------------+ SFJ      Full                                                        +---------+---------------+---------+-----------+----------+--------------+ FV Prox  Full                                                        +---------+---------------+---------+-----------+----------+--------------+ FV Mid   Full                                                        +---------+---------------+---------+-----------+----------+--------------+ FV DistalFull                                                        +---------+---------------+---------+-----------+----------+--------------+ PFV      Full                                                        +---------+---------------+---------+-----------+----------+--------------+ POP      Full           Yes      Yes                                 +---------+---------------+---------+-----------+----------+--------------+  PTV      Full                                                        +---------+---------------+---------+-----------+----------+--------------+ PERO     Full                                                        +---------+---------------+---------+-----------+----------+--------------+     Summary: RIGHT: - Findings consistent with acute deep vein thrombosis involving one right posterior tibial vein, and both right peroneal veins. - No cystic structure found in the popliteal fossa.  LEFT: - There is no evidence of deep vein thrombosis in the lower extremity.  - No cystic structure found in the popliteal fossa.  *See table(s) above for measurements and observations. Electronically signed by Jamelle Haring on 12/10/2020 at 7:16:00 PM.    Final    VAS [place] LOWER EXTREMITY VENOUS (DVT)  Result Date: 12/03/2020  Lower Venous DVT Study Indications: Pain.  Comparison Study: Previous 12/2019 Performing Technologist: Vonzell Schlatter RVT   Examination Guidelines: A complete evaluation includes B-mode imaging, spectral Doppler, color Doppler, and power Doppler as needed of all accessible portions of each vessel. Bilateral testing is considered an integral part of a complete examination. Limited examinations for reoccurring indications may be performed as noted. The reflux portion of the exam is performed with the patient in reverse Trendelenburg.  +---------+---------------+---------+-----------+----------+--------------+ RIGHT    CompressibilityPhasicitySpontaneityPropertiesThrombus Aging +---------+---------------+---------+-----------+----------+--------------+ CFV      Full           Yes      Yes                                 +---------+---------------+---------+-----------+----------+--------------+ SFJ      Full                                                        +---------+---------------+---------+-----------+----------+--------------+ FV Prox  Full                                                        +---------+---------------+---------+-----------+----------+--------------+ FV Mid   Full                                                        +---------+---------------+---------+-----------+----------+--------------+ FV DistalFull                                                        +---------+---------------+---------+-----------+----------+--------------+  PFV      Full                                                        +---------+---------------+---------+-----------+----------+--------------+ POP      Full           Yes      Yes                                 +---------+---------------+---------+-----------+----------+--------------+ PTV      Full                                                        +---------+---------------+---------+-----------+----------+--------------+ PERO     Full                                                         +---------+---------------+---------+-----------+----------+--------------+   +---------+---------------+---------+-----------+----------+--------------+ LEFT     CompressibilityPhasicitySpontaneityPropertiesThrombus Aging +---------+---------------+---------+-----------+----------+--------------+ CFV      Full           Yes      Yes                                 +---------+---------------+---------+-----------+----------+--------------+ SFJ      Full                                                        +---------+---------------+---------+-----------+----------+--------------+ FV Prox  Full                                                        +---------+---------------+---------+-----------+----------+--------------+ FV Mid   Full                                                        +---------+---------------+---------+-----------+----------+--------------+ FV DistalFull                                                        +---------+---------------+---------+-----------+----------+--------------+ PFV      Full                                                        +---------+---------------+---------+-----------+----------+--------------+  POP      Full           Yes      Yes                                 +---------+---------------+---------+-----------+----------+--------------+ PTV      Full                                                        +---------+---------------+---------+-----------+----------+--------------+ PERO     Full                                                        +---------+---------------+---------+-----------+----------+--------------+     Summary: RIGHT: - There is no evidence of deep vein thrombosis in the lower extremity.  - No cystic structure found in the popliteal fossa.  LEFT: - There is no evidence of deep vein thrombosis in the lower extremity.  - No cystic structure found in the popliteal fossa.   *See table(s) above for measurements and observations. Electronically signed by Ruta Hinds MD on 12/03/2020 at 12:15:41 PM.    Final    ECHOCARDIOGRAM LIMITED  Result Date: 12/10/2020    ECHOCARDIOGRAM LIMITED REPORT   Patient Name:   Glenn Gonzalez Date of Exam: 12/10/2020 Medical Rec #:  [id number]         Height:       74.0 in Accession #:    [id number]        Weight:       230.4 lb Date of Birth:  Apr 21, 1961        BSA:          2.308 m Patient Age:    13 years          BP:           105/72 mmHg Patient Gender: M                 HR:           99 bpm. Exam Location:  Inpatient Procedure: Limited Echo, Cardiac Doppler and Color Doppler Indications:    Acute DVT  History:        Patient has prior history of Echocardiogram examinations, most                 recent 11/30/2020. Arrythmias:Atrial Fibrillation. CKD. GERD.                 S/P heart transplant 01/22/16.  Sonographer:    Clayton Lefort RDCS (AE) Referring Phys: [id number] [facility] D NETTEY  Sonographer Comments: Technically difficult study due to poor echo windows, suboptimal apical window and suboptimal subcostal window. Unable to roll patient due to herniated discs. IMPRESSIONS  1. S/P Heart Transplant. Left ventricular ejection fraction, by estimation, is 60 to 65%. The left ventricle has normal function. Left ventricular endocardial border not optimally defined to evaluate regional wall motion. There is moderate concentric left ventricular hypertrophy. Left ventricular diastolic function could not be evaluated.  2. Right ventricular systolic function is moderately reduced. The right ventricular size is mildly enlarged.  Tricuspid regurgitation signal is inadequate for assessing PA pressure.  3. A small pericardial effusion is present. The pericardial effusion is surrounding the apex.  4. Tricuspid valve regurgitation is mild to moderate. FINDINGS  Left Ventricle: S/P Heart Transplant. Left ventricular ejection fraction, by estimation, is 60 to 65%. The  left ventricle has normal function. Left ventricular endocardial border not optimally defined to evaluate regional wall motion. There is moderate concentric left ventricular hypertrophy. Left ventricular diastolic function could not be evaluated. Right Ventricle: The right ventricular size is mildly enlarged. Right ventricular systolic function is moderately reduced. Tricuspid regurgitation signal is inadequate for assessing PA pressure. Pericardium: A small pericardial effusion is present. The pericardial effusion is surrounding the apex. Tricuspid Valve: Tricuspid valve regurgitation is mild to moderate. Venous: The inferior vena cava was not well visualized. 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  Molecular studies pending. -12/08/2020 CT chest/abdomen/pelvis without contrast showed "1. Retroperitoneal adenopathy and perinephric nodularity with dilation of the RIGHT renal pelvis and collecting systems, filled with what is suspected to represent neoplasm. 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 Differential considerations given heart transplantation and presumed immunosuppression would include posttransplant lymphoproliferative disorder/lymphoma. 2. Dense material in collecting systems may represent a  mixture of tumor and or blood and there is profound dilation of collecting systems of the RIGHT kidney. This is unchanged compared to the recent comparison study. 3. Signs of hepatic involvement with new lesions in the RIGHT hepatic lobe. 4. Ascites with small amount of ascites with very subtle infiltration of fat in the omentum not mentioned above, for instance on image 50 of series 2 raising the question of peritoneal involvement as well. This area is in the range of 2-3 mm. 5. Small RIGHT pleural effusion and basilar airspace disease. 6. Postoperative changes about the chest likely related to prior heart transplantation with surgical clips in the RIGHT axilla and thickening and calcification along the anterior pectoralis musculature not changed since 2018. 7. Calcification in the LEFT atrium also about the aortic root and pulmonary artery and to a lesser degree in the RIGHT atrium likely reflects changes of vascular anastomotic sites in the setting of heart transplantation. 8. 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 9. Post splenectomy. 10. Aortic atherosclerosis. 11. Small nodule in the LEFT upper lobe 6 mm airways are patent."  2) Back pain secondary to #1  3) Hyponatremia  4) Leukocytosis and Thrombocytosis  5) CKD  6) High-grade T1 bladder cancer  7) History of Hodgkin's lymphoma  8) status post heart transplant  PLAN: -Biopsy has resulted and results of been discussed with the patient.  Biopsy showed poorly differentiated carcinoma and based on pathology, differential includes poorly differentiated urothelial carcinoma.  Molecular studies and PD-L1 testing are pending. -He is aware that this is an incurable condition. -His chronic immunosuppression for his cardiac transplantation status he has an additional complicating factor that might limit immunotherapy options. -We discussed that in the right  setting the standard treatment would be cisplatin gemcitabine followed by avelumab maintenance.  He is not a good candidate for cisplatin given his renal function at this time and immunotherapy may not be an option given his need for chronic immunosuppression and risk for cardiac rejection with immunotherapy and the lack of its efficacy with T-cell immune suppression. -We discussed the possibility of using carboplatin/gemcitabine for palliative chemotherapy.  The patient has indicated that he would like to try chemotherapy.  Anticipate beginning chemotherapy as an outpatient. -We have a referral into Dr. Lawanna Kobus at [facility] to request a telephone consultation for a second opinion. -Palliative care continues to follow for pain management and goals of care discussion. -We can arrange for outpatient follow-up at the cancer center for consideration of chemotherapy.  The patient may be discharged home once pain is adequately controlled.    LOS: 6 days   Glenn Bussing, DNP, AGPCNP-BC, AOCNP 12/13/20    ADDENDUM  .Patient was Personally and independently interviewed, examined and relevant elements of the history of present illness were reviewed in details and an assessment and plan was created. All elements of the patient's history of present illness , assessment and plan were discussed in details with Glenn Bussing, DNP, AGPCNP-BC, AOCNP*. The above documentation reflects our combined findings assessment and plan.  Follow-up with Glenn Gonzalez will continue goals of care discussion and finalize treatment plan.  He still awaiting an appointment from [place] for the requested telephone consultation.  With his fluctuating renal function he is still not good treatment candidate for a platinum doublet even with carboplatin at this time.  Cannot use PD-1 or PD-L1 directed immunotherapies given has chronic immunosuppression and cardiac transplantation.  Awaiting foundation 1 and PD-L1 results.  Patient is  keen to consider palliative treatments and I recommended and discussed the possible use of Padcev.  We discussed the pros and cons of the treatment and possible toxicities.  Patient is keen to pursue this treatment.  Treatment orders have been placed and we shall try to see if we can do the first cycle of treatment as inpatient since the patient is still taking time for optimal symptom control to be able to be discharged.  Treating the cancer is going to be an important part of trying to help control his symptoms and therefore it is imperative that we start treating him immediately.  I discussed with patient and added low-dose fentanyl patch at 12.5 mcg/h to try to smooth in his pain control in addition to his as needed Dilaudid.  Sullivan Lone MD MS Total time 60 minutes more than 50% of the time in direct patient contact counseling and coordination of care.

## 2020-12-13 NOTE — Evaluation (Signed)
Occupational Therapy Evaluation Patient Details Name: COLONEL KRAUSER MRN: [id number] DOB: 1961-10-07 Today's Date: 12/13/2020    History of Present Illness ALLIN FRIX is a 60 y.o. male with medical history of hydronephrosis, hodgkin's lymphoma, colon cancer, bladder cancer, history of heart transplant, gout, depression, hypothyroidism. presents with c/o pain pain.   Clinical Impression   Mr. Nylen Creque is a 60 year old man with above medical history with a terminal illness and complaints of pain. On evaluation patient presents with decreased activity tolerance, generalized weakness, and pain. Patient min assist for supine to sit and min assist to min guard for sit to stand and transfer to recliner. Patient able to perform most parts of ADLs in seated position with set up. He needed assistance to clean periarea with standing. Patient able to perform 3 sit to stands at sink to wash his hair. Due to environment and leaning over the sink patient needed assistance. Due to increased pain with movement and overall fatigue patient declines therapy services acutely and at home - wanting to focus on comfort and limiting his use of pain medication. Therapist recommended increased activity while in hospital in order to maintain mobility for return home. Recommend wc due to patient's difficulty with pain in standing and ambulation and a shower seat for safety. No further OT needs at this time. If patient's GOC of care change please reorder as needed.    Follow Up Recommendations  No OT follow up , 24/7 assistance    Equipment Recommendations  Wheelchair (measurements OT);Tub/shower seat    Recommendations for Other Services       Precautions / Restrictions Precautions Precautions: Fall Precaution Comments: high pain Restrictions Weight Bearing Restrictions: No      Mobility Bed Mobility Overal bed mobility: Needs Assistance Bed Mobility: Supine to Sit     Supine to sit: Min  assist     General bed mobility comments: pt pulls on therapist's hand like bedrail to upright trunk and scoot out to EOB, increased time to complete    Transfers Overall transfer level: Needs assistance Equipment used: Rolling walker (2 wheeled);None Transfers: Sit to/from [employer] to Stand: Min guard;From elevated surface;Min assist Stand pivot transfers: Min guard       General transfer comment: min G from elevated bed, able to power up with gentle UE assisting, min A for 1 STS rep from lower seater chair, following 3 reps min G with heavy cues for quad/glute activation with  mobility; min G to pivot over to chair due to slightly unsteady without LOB    Balance Overall balance assessment: Mild deficits observed, not formally tested                                         ADL either performed or assessed with clinical judgement   ADL Overall ADL's : Needs assistance/impaired Eating/Feeding: Independent   Grooming: Set up;Sitting Grooming Details (indicate cue type and reason): Patient can perform all grooming with setup and in seated position. Upper Body Bathing: Set up   Lower Body Bathing: Minimal assistance;Sit to/from stand   Upper Body Dressing : Set up   Lower Body Dressing: Moderate assistance;Sit to/from stand   Toilet Transfer: Min guard;Stand-pivot;BSC   Toileting- Water quality scientist and Hygiene: Minimal assistance;Sit to/from stand       Functional mobility during ADLs: Min guard  Vision   Vision Assessment?: No apparent visual deficits     Perception     Praxis      Pertinent Vitals/Pain Pain Assessment: Faces Faces Pain Scale: Hurts even more Pain Location: L abdomen, R low back Pain Descriptors / Indicators: Aching;Grimacing;Guarding Pain Intervention(s): Limited activity within patient's tolerance;Premedicated before session;Repositioned     Hand Dominance     Extremity/Trunk Assessment  Upper Extremity Assessment Upper Extremity Assessment: Generalized weakness   Lower Extremity Assessment Lower Extremity Assessment: Defer to PT evaluation   Cervical / Trunk Assessment Cervical / Trunk Assessment: Kyphotic   Communication Communication Communication: No difficulties   Cognition Arousal/Alertness: Awake/alert Behavior During Therapy: WFL for tasks assessed/performed Overall Cognitive Status: Within Functional Limits for tasks assessed                                     General Comments       Exercises     Shoulder Instructions      Home Living Family/patient expects to be discharged to:: Private residence Living Arrangements: Spouse/significant other;Children Available Help at Discharge: Family;Available 24 hours/day Type of Home: House Home Access: Stairs to enter [employer] of Steps: 2 Entrance Stairs-Rails: None Home Layout: One level     Bathroom Shower/Tub: Occupational psychologist: Handicapped height     Home Equipment: Cane - single point;Bedside commode;Shower seat          Prior Functioning/Environment Level of Independence: Independent        Comments: Pt reports independent wtih ADLs, ambulation, spouse works from home, multiple siblings able to assist if needed.        OT Problem List: Decreased strength;Decreased activity tolerance;Impaired balance (sitting and/or standing);Pain      OT Treatment/Interventions:      OT Goals(Current goals can be found in the care plan section) Acute Rehab OT Goals Patient Stated Goal: no acute or home therapy, little pain with quality of life OT Goal Formulation: All assessment and education complete, DC therapy  OT Frequency:     Barriers to D/C:            Co-evaluation PT/OT/SLP Co-Evaluation/Treatment: Yes Reason for Co-Treatment: For patient/therapist safety;To address functional/ADL transfers (co-eval, poor activity tolerance due to  pain) PT goals addressed during session: Mobility/safety with mobility OT goals addressed during session: ADL's and self-care      AM-PAC OT "6 Clicks" Daily Activity     Outcome Measure Help from another person eating meals?: None Help from another person taking care of personal grooming?: A Little Help from another person toileting, which includes using toliet, bedpan, or urinal?: A Little Help from another person bathing (including washing, rinsing, drying)?: A Little Help from another person to put on and taking off regular upper body clothing?: A Little Help from another person to put on and taking off regular lower body clothing?: A Lot 6 Click Score: 18   End of Session Equipment Utilized During Treatment: Rolling walker Nurse Communication: Mobility status  Activity Tolerance: Patient limited by pain;Patient limited by fatigue Patient left: in chair;with call bell/phone within reach;with chair alarm set  OT Visit Diagnosis: Unsteadiness on feet (R26.81);Muscle weakness (generalized) (M62.81);Pain                Time: 2751-7001 OT Time Calculation (min): 40 min Charges:  OT General Charges $OT Visit: 1 Visit OT Evaluation $OT Eval Moderate Complexity:  1 Mod OT Treatments $Self Care/Home Management : 8-22 mins  Lillee Mooneyhan, OTR/L [facility]  Office [phone] Pager: [facility] 12/13/2020, 1:10 PM

## 2020-12-14 ENCOUNTER — Telehealth: Payer: Self-pay | Admitting: *Deleted

## 2020-12-14 ENCOUNTER — Other Ambulatory Visit: Payer: Self-pay | Admitting: Hematology

## 2020-12-14 DIAGNOSIS — G893 Neoplasm related pain (acute) (chronic): Secondary | ICD-10-CM

## 2020-12-14 DIAGNOSIS — Z5111 Encounter for antineoplastic chemotherapy: Secondary | ICD-10-CM | POA: Diagnosis not present

## 2020-12-14 DIAGNOSIS — C7910 Secondary malignant neoplasm of unspecified urinary organs: Secondary | ICD-10-CM | POA: Diagnosis not present

## 2020-12-14 LAB — CBC
HCT: 39.9 % (ref 39.0–52.0)
Hemoglobin: 13.7 g/dL (ref 13.0–17.0)
MCH: 31.7 pg (ref 26.0–34.0)
MCHC: 34.3 g/dL (ref 30.0–36.0)
MCV: 92.4 fL (ref 80.0–100.0)
Platelets: 464 10*3/uL — ABNORMAL HIGH (ref 150–400)
RBC: 4.32 MIL/uL (ref 4.22–5.81)
RDW: 15.2 % (ref 11.5–15.5)
WBC: 25 10*3/uL — ABNORMAL HIGH (ref 4.0–10.5)
nRBC: 0.1 % (ref 0.0–0.2)

## 2020-12-14 LAB — BASIC METABOLIC PANEL
Anion gap: 12 (ref 5–15)
BUN: 74 mg/dL — ABNORMAL HIGH (ref 6–20)
CO2: 22 mmol/L (ref 22–32)
Calcium: 9.1 mg/dL (ref 8.9–10.3)
Chloride: 91 mmol/L — ABNORMAL LOW (ref 98–111)
Creatinine, Ser: 3.48 mg/dL — ABNORMAL HIGH (ref 0.61–1.24)
GFR, Estimated: 19 mL/min — ABNORMAL LOW (ref >60)
Glucose, Bld: 117 mg/dL — ABNORMAL HIGH (ref 70–99)
Potassium: 4.9 mmol/L (ref 3.5–5.1)
Sodium: 125 mmol/L — ABNORMAL LOW (ref 135–145)

## 2020-12-14 LAB — HEPARIN LEVEL (UNFRACTIONATED): Heparin Unfractionated: 0.72 IU/mL — ABNORMAL HIGH (ref 0.30–0.70)

## 2020-12-14 MED ORDER — ONDANSETRON HCL 8 MG PO TABS: 8.0000 mg | ORAL_TABLET | Freq: Two times a day (BID) | ORAL | 1 refills | Status: AC | PRN

## 2020-12-14 MED ORDER — SIMETHICONE 80 MG PO CHEW
80.0000 mg | CHEWABLE_TABLET | Freq: Once | ORAL | Status: AC
  Administered 2020-12-14: 80 mg via ORAL
  Filled 2020-12-14: qty 1

## 2020-12-14 MED ORDER — PROCHLORPERAZINE MALEATE 10 MG PO TABS
10.0000 mg | ORAL_TABLET | Freq: Four times a day (QID) | ORAL | Status: DC | PRN
  Administered 2020-12-16 – 2020-12-22 (×3): 10 mg via ORAL
  Filled 2020-12-14 (×4): qty 1

## 2020-12-14 MED ORDER — ARTIFICIAL TEARS OPHTHALMIC OINT
1.0000 "application " | TOPICAL_OINTMENT | Freq: Every day | OPHTHALMIC | Status: DC
  Administered 2020-12-14 – 2020-12-29 (×16): 1 via OPHTHALMIC
  Filled 2020-12-14: qty 3.5

## 2020-12-14 MED ORDER — PALONOSETRON HCL INJECTION 0.25 MG/5ML
0.2500 mg | Freq: Once | INTRAVENOUS | Status: AC
Start: 2020-12-14 — End: 2020-12-14
  Administered 2020-12-14: 0.25 mg via INTRAVENOUS
  Filled 2020-12-14: qty 5

## 2020-12-14 MED ORDER — POLYVINYL ALCOHOL 1.4 % OP SOLN
1.0000 [drp] | Freq: Three times a day (TID) | OPHTHALMIC | Status: DC
  Administered 2020-12-14 – 2021-01-01 (×41): 1 [drp] via OPHTHALMIC
  Filled 2020-12-14: qty 15

## 2020-12-14 MED ORDER — ONDANSETRON HCL 4 MG/2ML IJ SOLN
4.0000 mg | Freq: Four times a day (QID) | INTRAMUSCULAR | Status: DC | PRN
  Filled 2020-12-14: qty 2

## 2020-12-14 MED ORDER — PROCHLORPERAZINE MALEATE 10 MG PO TABS: 10.0000 mg | ORAL_TABLET | Freq: Four times a day (QID) | ORAL | 1 refills | Status: AC | PRN

## 2020-12-14 MED ORDER — SODIUM CHLORIDE 0.9 % IV SOLN
10.0000 mg | Freq: Once | INTRAVENOUS | Status: DC
  Filled 2020-12-14: qty 1

## 2020-12-14 MED ORDER — DEXAMETHASONE SODIUM PHOSPHATE 10 MG/ML IJ SOLN
5.0000 mg | Freq: Once | INTRAMUSCULAR | Status: AC
  Administered 2020-12-14: 5 mg via INTRAVENOUS
  Filled 2020-12-14: qty 1

## 2020-12-14 MED ORDER — ONDANSETRON HCL 4 MG PO TABS
4.0000 mg | ORAL_TABLET | Freq: Four times a day (QID) | ORAL | Status: DC | PRN
  Administered 2020-12-25: 4 mg via ORAL
  Filled 2020-12-14: qty 1

## 2020-12-14 MED ORDER — SODIUM CHLORIDE 0.9 % IV SOLN: Freq: Once | INTRAVENOUS | Status: AC

## 2020-12-14 MED ORDER — SODIUM CHLORIDE 0.9 % IV SOLN
1.0000 mg/kg | Freq: Once | INTRAVENOUS | Status: AC
  Administered 2020-12-14: 110 mg via INTRAVENOUS
  Filled 2020-12-14: qty 9

## 2020-12-14 MED ORDER — LORAZEPAM 0.5 MG PO TABS: 0.5000 mg | ORAL_TABLET | Freq: Four times a day (QID) | ORAL | 0 refills | Status: AC | PRN

## 2020-12-14 MED ORDER — DEXAMETHASONE 4 MG PO TABS: 8.0000 mg | ORAL_TABLET | Freq: Every day | ORAL | 1 refills | Status: AC

## 2020-12-14 NOTE — Progress Notes (Signed)
[facility] for heparin Indication: acute DVT  Allergies  Allergen Reactions  . Digoxin Other (See Comments)    gynecomastia   . Phencyclidine Other (See Comments)    PCP derived antibiotic > Insomnia  . Spironolactone Other (See Comments)    Gynecomastia  . Oxybutynin Chloride     Other reaction(s): low BP  . Lactose Intolerance (Gi) Diarrhea and Other (See Comments)    cramps  . Penicillins Other (See Comments)    Cramps in stomach Did it involve swelling of the face/tongue/throat, SOB, or low BP? No Did it involve sudden or severe rash/hives, skin peeling, or any reaction on the inside of your mouth or nose? No Did you need to seek medical attention at a hospital or doctor's office? No When did it last happen?unknown If all above answers are "NO", may proceed with cephalosporin use.     . Sulfur Rash   Patient Measurements: Height: 6\' 2"  (188 cm) Weight: 109.6 kg (241 lb 9.6 oz) IBW/kg (Calculated) : 82.2 Heparin Dosing Weight: 102 kg  Vital Signs: Temp: 97.6 F (36.4 C) (01/14 0430) Temp Source: Oral (01/14 0430) BP: 125/88 (01/14 0430) Pulse Rate: 97 (01/14 0430)  Labs: Recent Labs    12/12/20 0616 12/13/20 0657 12/14/20 0717  HGB 13.2 13.8 13.7  HCT 40.1 41.2 39.9  PLT 408* 444* 464*  HEPARINUNFRC 0.58 0.59 0.72*  CREATININE 3.17* 3.25* 3.48*    Estimated Creatinine Clearance: 30.1 mL/min (A) (by C-G formula based on SCr of 3.48 mg/dL (H)).  Assessment: Patient is a 60 y.o M with hx heart transplant, Hodgkin lymphoma and bladder cancer who was recently hospitalized from 12/30 to 1/3 for PNA.  He also underwent cystoscopy with right ureteral stent placement on 11/29/20 for right hydronephrosis and was also found to have new retroperitoneal lymphadenopathy during this admission.  He went to see his urologist on 1/5 with c/o back pain and was sent to [facility] for further workup.  He underwent CT guided right  retroperitoneal lymph node biopsy on 1/6 with retroperitoneal hemorrhage noted on post-biopsy CT. DDimer was >20 on 1/10 with LE doppler positive for acute RLE DVT. Pharmacy is consulted to start heparin drip for acute VTE- no Bolus   HL = 0.72, Slightly above therapeutic range at rate of 1750 units/hr  CBC ok  SCr rising, renal consult  No reported bleeding or issues of note  Goal of Therapy:  Heparin level 0.3-0.7 units/ml Monitor platelets by anticoagulation protocol: Yes   Plan:   Reduce Heparin to 1600 units/hr, recheck level in am  Monitor for s/sx bleeding  Daily CBC, heparin level  Minda Ditto PharmD 12/14/2020,8:03 AM

## 2020-12-14 NOTE — Progress Notes (Signed)
[facility] Progress Note  Subjective: creat up 3.4, Na+ 125, stable. Flank pain not as bad. No n/v or confusion.   Vitals:   12/13/20 0551 12/13/20 1330 12/13/20 2022 12/14/20 0430  BP: 118/81 126/87 120/79 125/88  Pulse: 89 (!) 101 94 97  Resp: '16 18 16 16  ' Temp: 97.8 F (36.6 C) 98 F (36.7 C) 99.1 F (37.3 C) 97.6 F (36.4 C)  TempSrc: Oral Oral Oral Oral  SpO2: 95% 95% 95% 93%  Weight:      Height:        Exam: Gen alert, not in distress No jvd or bruits Chest clear bilat to bases RRR no MRG Abd protuberant, no ascites by exam, no hsm, +BS Ext 2+ pitting bilat LE from feet to hips Neuro is alert, Ox 3 , nf    Home meds:  - allopurinol 200  - synthroid 125 ug qd  - rapeprazole 40 bid/ florastor bid  - tramadol 50 qid prn/ prednisone 52m qd/  / prn flexeril  - azelastine-fluticasone qd  - prn's/ vitamins/ supplements    UA 12/30/2020 - 5 ket, 30 prot, 11-20 rbc, 11-20 wbc, 0-5 epis     UA 11/29/20  lrg Hb, small LE, neg protein, >50 rbc, 6-10 wbc    UCx no growth,  BCx's - no growth x 2    I/O complete - 5L in and 7 L out = - 2 L    Wt's 102kg admit > today 104.5 kg      Date              Creat               eGFR   2008- 16        0.9- 1.3               2017              1.1- 2.1   2018- 2020    1.3- 2.31 Aug 2020       1.2- 1.4            55-57   11/29/20        2.49                 32   12/23/2020            1.81                 43   12/08/20            2.07   12/10/20          2.35                 31     Abd CT - Urinary Tract: Fullness of the RIGHT renal pelvis with nephroureteral stent in place showing soft tissue density with distension of the renal pelvis and calices similar to the study acquired on the same date. Nephrolithiasis also unchanged. Perinephric nodularity along the medial upper pole the LEFT kidney measures 6.4 x 2.7 cm which is within 1-2 mm of its previous measurement when measured by this observer on the prior study.  Increased in size when compared to the study of November 30, 2020. Renal cortical scarring on the LEFT with nephrolithiasis in the lower pole unchanged from very recent comparison. Urinary bladder with small amount of gas in the urinary bladder with distal aspect of the stent  in place, loop coiled in the urinary bladder.  Assessment/ Plan: 1. AKI on CKD 3a - b/l creat 1.2- 1.4 from oct 2021. Renal function worsening over last few weeks. UA +wbc's and rbc's, no sig proteinuria. Imaging shows stable R ureteral stent and stable R collecting system dilatation.  Has new significant leg edema bilaterally. ECHO showed normal EF. Creat 1.8 on admission > up to 2.3 then 2.8 after lasix tried, then 3.1 >  3.2 > 3.4 today. Lasix IV tried x 1. Possibly lymphatic obstruction d/t malignancy + low albumin causing LE edema. BP's wnl, up 8kg here w/ sig LE edema. No new suggestions, cont supportive care, no indication for RRT at this time.   2. Hyponatremia - Uosm up and UNa normal. Focal LE edema not responsive to lasix.  3. High-grade bladder cancer- recent dx , treated w/ BCG in Aug 2021, sp TURBT x 2 Aug and Sept. New RP adenopathy w/ R hydro in Dec 2021 > cysto and R ureteral stent per urology. Biopsy RP mass done last week showed poorly diff urothelial carcinoma, molecular markers pending. Onc consulting w/ [facility].   4. Edema - new bilat sig leg edema, no CHF. Failed trial of IV lasix. Supportive care.  5. H/o Hodgkin's lymphoma - remote 6. H/o cardiac transplant - taking prograf 1.5 mg /d and pred 5 /d 7. Acute RLE DVT - per primary team 8. Chronic diast CHF - had prior LVEF 10% but now has corrected to 60-65%. F/ [facility] system.        Glenn Gonzalez 12/14/2020, 4:15 PM   Recent Labs  Lab 12/13/20 0657 12/14/20 0717  K 4.9 4.9  BUN 65* 74*  CREATININE 3.25* 3.48*  CALCIUM 9.2 9.1  HGB 13.8 13.7   Inpatient medications: . allopurinol  200 mg Oral Daily  . artificial tears  1 application Both Eyes  QHS  . azelastine  1 spray Each Nare Daily   And  . fluticasone  1 spray Each Nare Daily  . dexamethasone (DECADRON) injection  5 mg Intravenous Once  . dexamethasone  4 mg Oral BID WC  . docusate sodium  100 mg Oral BID  . enfortumab vedotin-ejfv (PADCEV) CHEMO IV infusion  1 mg/kg (Treatment Plan Recorded) Intravenous Once  . fentaNYL  1 patch Transdermal Q72H  . lactulose  10 g Oral BID  . levothyroxine  125 mcg Oral Daily  . loratadine  10 mg Oral Daily  . metoCLOPramide  5 mg Oral TID AC & HS  . palonosetron  0.25 mg Intravenous Once  . pantoprazole  40 mg Oral BID  . polyvinyl alcohol  1 drop Both Eyes TID  . senna-docusate  2 tablet Oral BID  . sucralfate  1 g Oral BID AC & HS  . Tacrolimus ER  1.5 mg Oral Daily  . traZODone  100 mg Oral QHS   . sodium chloride    . heparin 1,600 Units/hr (12/14/20 1000)   acetaminophen **OR** acetaminophen, bisacodyl, calcium carbonate, HYDROmorphone (DILAUDID) injection, HYDROmorphone, magnesium hydroxide, [START ON 12/17/2020] ondansetron **OR** [START ON 12/17/2020] ondansetron (ZOFRAN) IV, prochlorperazine

## 2020-12-14 NOTE — Progress Notes (Signed)
Palliative care brief note  Reviewed chart and discussed with Dr. Maylene Roes as well as bedside RN.  Plan is for initiation of chemotherapy today.  He is reported to have better pain control today with combination of oral dilaudid and low dose fentanyl patch.  I stopped by to see [name] and he was sleeping soundly.  I did not wake him today as symptoms seem well managed currently.  I will plan to follow-up tomorrow.  Micheline Rough, MD [facility] Team [phone]  NO CHARGE NOTE

## 2020-12-14 NOTE — Progress Notes (Addendum)
HEMATOLOGY-ONCOLOGY PROGRESS NOTE  SUBJECTIVE: The patient was started on a fentanyl patch last evening reports that his pain is overall better controlled. However, he reports that he has had some sharp pains over his right upper quadrant.  He still requires Dilaudid for breakthrough pain.  Offers no other complaints today.  REVIEW OF SYSTEMS:   Constitutional: Denies fevers, chills Eyes: Denies blurriness of vision Ears, nose, mouth, throat, and face: Denies mucositis or sore throat Respiratory: Denies cough, dyspnea or wheezes Cardiovascular: Denies palpitation, chest discomfort Gastrointestinal:  Denies nausea, heartburn or change in bowel habits Skin: Denies abnormal skin rashes Lymphatics: Denies new lymphadenopathy or easy bruising Neurological:Denies numbness, tingling or new weaknesses Behavioral/Psych: Mood is stable, no new changes  Extremities: No lower extremity edema All other systems were reviewed with the patient and are negative.  I have reviewed the past medical history, past surgical history, social history and family history with the patient and they are unchanged from previous note.   PHYSICAL EXAMINATION: ECOG PERFORMANCE STATUS: 3 - Symptomatic, >50% confined to bed  Vitals:   12/13/20 2022 12/14/20 0430  BP: 120/79 125/88  Pulse: 94 97  Resp: 16 16  Temp: 99.1 F (37.3 C) 97.6 F (36.4 C)  SpO2: 95% 93%   Filed Weights   12/10/20 0610 12/10/20 1553 12/10/20 1804  Weight: 104.5 kg 106.2 kg 109.6 kg    Intake/Output from previous day: 01/13 0701 - 01/14 0700 In: 1102.3 [P.O.:600; I.V.:502.3] Out: -   GENERAL:alert, no distress and comfortable SKIN: skin color, texture, turgor are normal, no rashes or significant lesions EYES: normal, Conjunctiva are pink and non-injected, sclera clear LUNGS: clear to auscultation and percussion with normal breathing effort HEART: regular rate & rhythm and no murmurs and no lower extremity edema ABDOMEN:abdomen  soft, non-tender and normal bowel sounds Musculoskeletal:no cyanosis of digits and no clubbing  NEURO: alert & oriented x 3 with fluent speech, no focal motor/sensory deficits  LABORATORY DATA:  I have reviewed the data as listed CMP Latest Ref Rng & Units 12/14/2020 12/13/2020 12/12/2020  Glucose 70 - 99 mg/dL 117(H) 124(H) 108(H)  BUN 6 - 20 mg/dL 74(H) 65(H) 55(H)  Creatinine 0.61 - 1.24 mg/dL 3.48(H) 3.25(H) 3.17(H)  Sodium 135 - 145 mmol/L 125(L) 124(L) 126(L)  Potassium 3.5 - 5.1 mmol/L 4.9 4.9 4.3  Chloride 98 - 111 mmol/L 91(L) 90(L) 91(L)  CO2 22 - 32 mmol/L 22 20(L) 21(L)  Calcium 8.9 - 10.3 mg/dL 9.1 9.2 9.3  Total Protein 6.5 - 8.1 g/dL - - 5.8(L)  Total Bilirubin 0.3 - 1.2 mg/dL - - 0.8  Alkaline Phos 38 - 126 U/L - - 60  AST 15 - 41 U/L - - 20  ALT 0 - 44 U/L - - 9    Lab Results  Component Value Date   WBC 25.0 (H) 12/14/2020   HGB 13.7 12/14/2020   HCT 39.9 12/14/2020   MCV 92.4 12/14/2020   PLT 464 (H) 12/14/2020   NEUTROABS 16.6 (H) 12/31/2020    CT ABDOMEN PELVIS WO CONTRAST  Result Date: 12/21/2020 CLINICAL DATA:  Cancer of unknown primary in this 60 year old male. EXAM: CT CHEST, ABDOMEN AND PELVIS WITHOUT CONTRAST TECHNIQUE: Multidetector CT imaging of the chest, abdomen and pelvis was performed following the standard protocol without IV contrast. COMPARISON:  CT abdomen and pelvis of the same date. Also with CT of December 30th of 2021 FINDINGS: CT CHEST FINDINGS Cardiovascular: Calcified atheromatous plaque in the thoracic aorta. Heart size is normal following  sternotomy for heart transplant by report a [facility] medical center. Three-vessel coronary artery calcification. No pericardial effusion. Calcifications in the LEFT atrium partially seen on previous imaging are of uncertain significance central pulmonary vasculature also with some signs of calcification. Mediastinum/Nodes: Ovoid structure associated with LEFT thyroid 11 mm similar to previous  imaging, cervical spine from 2017. Thyroid as described above. Calcifications in the LEFT supraclavicular region may be within venous structures on the LEFT. No axillary lymphadenopathy. Surgical clips in the RIGHT axilla and soft tissue thickening over the LEFT pectoralis musculature similar grossly compared to remote MRI evaluation 2018 and surgical clips seen on numerous priors in the RIGHT axilla. No mediastinal lymphadenopathy. No gross hilar lymphadenopathy. Lungs/Pleura: Small RIGHT of pleural effusion and basilar airspace disease. Small nodule in the superior aspect of the RIGHT middle lobe approximately 3 mm on image 57 of series 5. Biapical scarring. Small nodule in the LEFT upper lobe 6 mm (image 51, series 5) airways are patent. Musculoskeletal: Evidence ir sternotomy without significant bony bridging but with sclerotic margins along the sternotomy line similar to studies dating back to August of 2021 with respect to visualized portions on prior abdomen studies no destructive bone finding or acute bone process. CT ABDOMEN PELVIS FINDINGS Hepatobiliary: 1.9 cm lesion in the RIGHT hepatic lobe near the dome of the RIGHT hemi liver does not display attenuation values of simple cyst is unchanged from the study acquired on the same date at another facility. Additionally a 1.4 cm lesion in the RIGHT hepatic lobe on image 23 of series 2 is unchanged. Sludge in the gallbladder. Tiny low-density foci in the LEFT hepatic lobe without change 1 on image 19 of series 2 in the lateral segment measuring 5 mm and another in the medial segment of the LEFT hepatic lobe measuring 12 mm on image 21 of series 2 these areas appears similar dating back to August of 2020, lesions in the RIGHT hepatic lobe are new. Pancreas: Normal pancreatic contour without signs of inflammation. Spleen: Post splenectomy. Adrenals/Urinary Tract: Adrenal glands, normal on the LEFT and obscured on the RIGHT by perinephric and Peri adrenal  nodularity. Fullness of the RIGHT renal pelvis with nephroureteral stent in place showing soft tissue density with distension of the renal pelvis and calices similar to the study acquired on the same date. Nephrolithiasis also unchanged. Perinephric nodularity along the medial upper pole the LEFT kidney measures 6.4 x 2.7 cm which is within 1-2 mm of its previous measurement when measured by this observer on the prior study. Increased in size when compared to the study of November 30, 2020. Renal cortical scarring on the LEFT with nephrolithiasis in the lower pole unchanged from very recent comparison. Urinary bladder with small amount of gas in the urinary bladder with distal aspect of the stent in place, loop coiled in the urinary bladder. Stomach/Bowel: Scattered colonic diverticulosis without acute gastrointestinal process. Normal appendix. Stool and contrast throughout the colon. Vascular/Lymphatic: Calcified atheromatous plaque in the abdominal aorta without aneurysmal dilation. Retroperitoneal adenopathy unchanged from scan earlier the same day, largest lymph nodes behind the inferior vena cava and in the intra-aortocaval groove and in the upper abdomen, largest on image 41 of series 2 measuring 1.9 cm. Smaller lymph nodes along the LEFT periaortic chain. Postoperative changes in the retroperitoneum associated with previous lymphadenectomy No pelvic lymphadenopathy. Reproductive: Prostate with uro lift device deployment bilaterally similar to recent priors. Other: Extensive retroperitoneal fascial thickening/stranding extending into the root of the small bowel mesentery,  nodular changes associated with this thickening on the RIGHT outlining the renal fascia this crosses the midline where it is without significant nodularity but with fascial thickening and stranding, also tracking into the pelvis. Small amount of fluid in the pelvis. Musculoskeletal: Spinal degenerative changes. No destructive bone process or  acute bone finding. IMPRESSION: 1. Retroperitoneal adenopathy and perinephric nodularity with dilation of the RIGHT renal pelvis and collecting systems, filled with what is suspected to represent neoplasm. 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 Differential considerations given heart transplantation and presumed immunosuppression would include posttransplant lymphoproliferative disorder/lymphoma. 2. Dense material in collecting systems may represent a mixture of tumor and or blood and there is profound dilation of collecting systems of the RIGHT kidney. This is unchanged compared to the recent comparison study. 3. Signs of hepatic involvement with new lesions in the RIGHT hepatic lobe. 4. Ascites with small amount of ascites with very subtle infiltration of fat in the omentum not mentioned above, for instance on image 50 of series 2 raising the question of peritoneal involvement as well. This area is in the range of 2-3 mm. 5. Small RIGHT pleural effusion and basilar airspace disease. 6. Postoperative changes about the chest likely related to prior heart transplantation with surgical clips in the RIGHT axilla and thickening and calcification along the anterior pectoralis musculature not changed since 2018. 7. Calcification in the LEFT atrium also about the aortic root and pulmonary artery and to a lesser degree in the RIGHT atrium likely reflects changes of vascular anastomotic sites in the setting of heart transplantation. 8. 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 9. Post splenectomy. 10. Aortic atherosclerosis. 11. Small nodule in the LEFT upper lobe 6 mm airways are patent. These results will be called to the ordering clinician or representative by the Radiologist Assistant, and communication documented in the PACS or [employer]. Aortic Atherosclerosis (ICD10-I70.0). Electronically Signed   By: Zetta Bills M.D.   On: 12/31/2020 17:38   DG Chest 1 View  Result Date: 11/29/2020 CLINICAL DATA:  Hypoxia EXAM: CHEST  1 VIEW COMPARISON:  08/03/2020 FINDINGS: Since the prior examination, there has developed right basilar consolidation, possibly infectious in the appropriate clinical setting. Mild focal infiltrate is also noted within the right apex. Left lung is clear. No pneumothorax or pleural effusion. Median sternotomy has been performed. Cardiac size within normal limits. Multiple healed right rib fractures are noted. Multiple surgical clips are seen within the right axilla. IMPRESSION: Interval development of multifocal pulmonary infiltrate within the right lung, more focal within the right lung base, suspicious for atypical infection in the acute setting. Electronically Signed   By: Fidela Salisbury MD   On: 11/29/2020 22:27   DG Chest 2 View  Result Date: 12/13/2020 CLINICAL DATA:  Bladder cancer.  Follow-up multifocal pneumonia. EXAM: CHEST - 2 VIEW COMPARISON:  11/29/2020 FINDINGS: Prior median sternotomy. Heart is normal size. Patchy right lung airspace disease in the right apex and right lower lung. Minimal left base linear atelectasis or scarring. Overall aeration in the right lung slightly improved. No effusions. IMPRESSION: Patchy right lung airspace disease with slight improvement since prior study. Left base atelectasis or scarring. Electronically Signed   By: Rolm Baptise M.D.   On: 12/23/2020 13:25   CT CHEST WO CONTRAST  Result Date: 12/19/2020 CLINICAL DATA:  Cancer of unknown primary in this  60 year old male. EXAM: CT CHEST, ABDOMEN AND PELVIS WITHOUT CONTRAST TECHNIQUE: Multidetector CT imaging of the chest, abdomen and pelvis was performed following the standard protocol without IV contrast. COMPARISON:  CT abdomen and pelvis of the same date. Also with CT of December 30th of 2021 FINDINGS: CT CHEST  FINDINGS Cardiovascular: Calcified atheromatous plaque in the thoracic aorta. Heart size is normal following sternotomy for heart transplant by report a [facility] medical center. Three-vessel coronary artery calcification. No pericardial effusion. Calcifications in the LEFT atrium partially seen on previous imaging are of uncertain significance central pulmonary vasculature also with some signs of calcification. Mediastinum/Nodes: Ovoid structure associated with LEFT thyroid 11 mm similar to previous imaging, cervical spine from 2017. Thyroid as described above. Calcifications in the LEFT supraclavicular region may be within venous structures on the LEFT. No axillary lymphadenopathy. Surgical clips in the RIGHT axilla and soft tissue thickening over the LEFT pectoralis musculature similar grossly compared to remote MRI evaluation 2018 and surgical clips seen on numerous priors in the RIGHT axilla. No mediastinal lymphadenopathy. No gross hilar lymphadenopathy. Lungs/Pleura: Small RIGHT of pleural effusion and basilar airspace disease. Small nodule in the superior aspect of the RIGHT middle lobe approximately 3 mm on image 57 of series 5. Biapical scarring. Small nodule in the LEFT upper lobe 6 mm (image 51, series 5) airways are patent. Musculoskeletal: Evidence ir sternotomy without significant bony bridging but with sclerotic margins along the sternotomy line similar to studies dating back to August of 2021 with respect to visualized portions on prior abdomen studies no destructive bone finding or acute bone process. CT ABDOMEN PELVIS FINDINGS Hepatobiliary: 1.9 cm lesion in the RIGHT hepatic lobe near the dome of the RIGHT hemi liver does not display attenuation values of simple cyst is unchanged from the study acquired on the same date at another facility. Additionally a 1.4 cm lesion in the RIGHT hepatic lobe on image 23 of series 2 is unchanged. Sludge in the gallbladder. Tiny low-density foci in the  LEFT hepatic lobe without change 1 on image 19 of series 2 in the lateral segment measuring 5 mm and another in the medial segment of the LEFT hepatic lobe measuring 12 mm on image 21 of series 2 these areas appears similar dating back to August of 2020, lesions in the RIGHT hepatic lobe are new. Pancreas: Normal pancreatic contour without signs of inflammation. Spleen: Post splenectomy. Adrenals/Urinary Tract: Adrenal glands, normal on the LEFT and obscured on the RIGHT by perinephric and Peri adrenal nodularity. Fullness of the RIGHT renal pelvis with nephroureteral stent in place showing soft tissue density with distension of the renal pelvis and calices similar to the study acquired on the same date. Nephrolithiasis also unchanged. Perinephric nodularity along the medial upper pole the LEFT kidney measures 6.4 x 2.7 cm which is within 1-2 mm of its previous measurement when measured by this observer on the prior study. Increased in size when compared to the study of November 30, 2020. Renal cortical scarring on the LEFT with nephrolithiasis in the lower pole unchanged from very recent comparison. Urinary bladder with small amount of gas in the urinary bladder with distal aspect of the stent in place, loop coiled in the urinary bladder. Stomach/Bowel: Scattered colonic diverticulosis without acute gastrointestinal process. Normal appendix. Stool and contrast throughout the colon. Vascular/Lymphatic: Calcified atheromatous plaque in the abdominal aorta without aneurysmal dilation. Retroperitoneal adenopathy unchanged from scan earlier the same day, largest lymph nodes behind the inferior vena cava and in  the intra-aortocaval groove and in the upper abdomen, largest on image 41 of series 2 measuring 1.9 cm. Smaller lymph nodes along the LEFT periaortic chain. Postoperative changes in the retroperitoneum associated with previous lymphadenectomy No pelvic lymphadenopathy. Reproductive: Prostate with uro lift device  deployment bilaterally similar to recent priors. Other: Extensive retroperitoneal fascial thickening/stranding extending into the root of the small bowel mesentery, nodular changes associated with this thickening on the RIGHT outlining the renal fascia this crosses the midline where it is without significant nodularity but with fascial thickening and stranding, also tracking into the pelvis. Small amount of fluid in the pelvis. Musculoskeletal: Spinal degenerative changes. No destructive bone process or acute bone finding. IMPRESSION: 1. Retroperitoneal adenopathy and perinephric nodularity with dilation of the RIGHT renal pelvis and collecting systems, filled with what is suspected to represent neoplasm. 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 Differential considerations given heart transplantation and presumed immunosuppression would include posttransplant lymphoproliferative disorder/lymphoma. 2. Dense material in collecting systems may represent a mixture of tumor and or blood and there is profound dilation of collecting systems of the RIGHT kidney. This is unchanged compared to the recent comparison study. 3. Signs of hepatic involvement with new lesions in the RIGHT hepatic lobe. 4. Ascites with small amount of ascites with very subtle infiltration of fat in the omentum not mentioned above, for instance on image 50 of series 2 raising the question of peritoneal involvement as well. This area is in the range of 2-3 mm. 5. Small RIGHT pleural effusion and basilar airspace disease. 6. Postoperative changes about the chest likely related to prior heart transplantation with surgical clips in the RIGHT axilla and thickening and calcification along the anterior pectoralis musculature not changed since 2018. 7. Calcification in the LEFT atrium also about the aortic root and pulmonary artery and to a lesser degree in  the RIGHT atrium likely reflects changes of vascular anastomotic sites in the setting of heart transplantation. 8. 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 9. Post splenectomy. 10. Aortic atherosclerosis. 11. Small nodule in the LEFT upper lobe 6 mm airways are patent. These results will be called to the ordering clinician or representative by the Radiologist Assistant, and communication documented in the PACS or [employer]. Aortic Atherosclerosis (ICD10-I70.0). Electronically Signed   By: Zetta Bills M.D.   On: 12/20/2020 17:38   NM Pulmonary Perfusion  Result Date: 12/26/2020 CLINICAL DATA:  Respiratory failure EXAM: NUCLEAR MEDICINE PERFUSION LUNG SCAN TECHNIQUE: Perfusion images were obtained in multiple projections after intravenous injection of radiopharmaceutical. Ventilation scans intentionally deferred if perfusion scan and chest x-ray adequate for interpretation during COVID 19 epidemic. RADIOPHARMACEUTICALS:  4.1 mCi Tc-44m MAA IV COMPARISON:  Chest x-ray 12/15/2020 FINDINGS: Slightly heterogeneous distribution of radiotracer within the lung fields. Multiple small bilateral wedge-shaped perfusion defects. No corresponding radiographic abnormality. No large mismatched segmental perfusion defect. IMPRESSION: Intermediate probability for pulmonary embolism. Electronically Signed   By: Davina Poke D.O.   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 ANESTHESIA/SEDATION: Fentanyl 100 mcg IV; Versed 4 mg IV Sedation time: 14 minutes; The patient was continuously monitored during the procedure by the interventional radiology nurse under my direct supervision. CONTRAST:  None. COMPLICATIONS: SIR  Level A - No therapy, no consequence. Retroperitoneal hemorrhage at site of biopsy, controlled with Gel-Foam slurry administration along needle track. PROCEDURE: Informed consent was obtained from the patient following an explanation of the procedure, risks, benefits and alternatives. A time out was performed prior to the initiation of the procedure. The patient was positioned in a partial left lateral decubitus position on the CT table and a limited CT was performed for procedural planning demonstrating similar appearing multifocal right retroperitoneal lymphadenopathy. The procedure was planned. The operative site was prepped and draped in the usual sterile fashion. Appropriate trajectory was confirmed with a 22 gauge spinal needle after the adjacent tissues were anesthetized with 1% Lidocaine with epinephrine. Under intermittent CT guidance, a 17 gauge coaxial needle was advanced into the peripheral aspect of the mass. Appropriate positioning was confirmed and a total of 4 samples were obtained with an 18 gauge core needle biopsy device. A limited CT demonstrated focal hemorrhage about the biopsied lymph node. Gel-Foam slurry was injected through the introducer needle along the needle track. The co-axial needle was removed and hemostasis was achieved with manual compression. Additional limited postprocedural CT was negative for expanding hemorrhage or additional complication. A dressing was placed. The patient tolerated the procedure well without immediate postprocedural complication. IMPRESSION: Technically successful CT guided core needle biopsy of right retroperitoneal lymph node. Ruthann Cancer, MD [facility] Electronically Signed   By: Ruthann Cancer MD   On: 12/07/2020 08:53   DG CHEST PORT 1 VIEW  Result Date: 12/10/2020 CLINICAL DATA:  Hypoxia.  History of cardiac transplant EXAM: PORTABLE CHEST 1 VIEW COMPARISON:  12/15/2020 FINDINGS: Prior  median sternotomy. Heart size within normal limits. Atherosclerotic calcification of the aortic knob. Subtle right lower lobe opacity, slightly improved from prior. Trace right pleural effusion. No pneumothorax. IMPRESSION: 1. Subtle right lower lobe opacity, slightly improved from prior. 2. Trace right pleural effusion. Electronically Signed   By: Davina Poke D.O.   On: 12/10/2020 10:33   DG C-Arm 1-60 Min-No Report  Result Date: 11/29/2020 Fluoroscopy was utilized by the requesting physician.  No radiographic interpretation.   ECHOCARDIOGRAM COMPLETE  Result Date: 11/30/2020    ECHOCARDIOGRAM REPORT   Patient Name:   Glenn Gonzalez Date of Exam: 11/30/2020 Medical Rec #:  [id number]         Height:       74.0 in Accession #:    [id number]        Weight:       221.0 lb Date of Birth:  1961-08-17        BSA:          2.268 m Patient Age:    25 years          BP:           103/59 mmHg Patient Gender: M                 HR:           94 bpm. Exam Location:  Inpatient Procedure: 2D Echo, Cardiac Doppler, Color Doppler and Intracardiac            Opacification Agent Indications:    Dyspnea R06.00  History:        Patient has prior history of Echocardiogram examinations, most                 recent 05/12/2017. Arrythmias:Atrial Fibrillation; Risk  Factors:Non-Smoker. GERD. 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  Sonographer:    Vickie Epley RDCS Referring Phys: [id number] [facility]  Sonographer Comments: Suboptimal apical window and suboptimal subcostal window. Pt unable to turn on side due to herniated discs. IMPRESSIONS  1. Left ventricular ejection fraction, by estimation, is 60 to 65%. The left ventricle has normal function. The left ventricle has no regional wall motion abnormalities. Left ventricular diastolic parameters were normal.  2. Right ventricular systolic function is normal. The right ventricular size is normal. There is normal pulmonary artery systolic pressure. The estimated  right ventricular systolic pressure is 24.2 mmHg.  3. The mitral valve is normal in structure. No evidence of mitral valve regurgitation. No evidence of mitral stenosis.  4. The aortic valve is normal in structure. Aortic valve regurgitation is not visualized. No aortic stenosis is present.  5. The inferior vena cava is normal in size with greater than 50% respiratory variability, suggesting right atrial pressure of 3 mmHg. FINDINGS  Left Ventricle: Left ventricular ejection fraction, by estimation, is 60 to 65%. The left ventricle has normal function. The left ventricle has no regional wall motion abnormalities. Definity contrast agent was given IV to delineate the left ventricular  endocardial borders. The left ventricular internal cavity size was normal in size. There is no left ventricular hypertrophy. Left ventricular diastolic parameters were normal. Normal left ventricular filling pressure. Right Ventricle: The right ventricular size is normal. No increase in right ventricular wall thickness. Right ventricular systolic function is normal. There is normal pulmonary artery systolic pressure. The tricuspid regurgitant velocity is 2.39 m/s, and  with an assumed right atrial pressure of 3 mmHg, the estimated right ventricular systolic pressure is 35.3 mmHg. Left Atrium: Left atrial size was normal in size. Right Atrium: Right atrial size was normal in size. Pericardium: There is no evidence of pericardial effusion. Mitral Valve: The mitral valve is normal in structure. No evidence of mitral valve regurgitation. No evidence of mitral valve stenosis. Tricuspid Valve: The tricuspid valve is normal in structure. Tricuspid valve regurgitation is mild . No evidence of tricuspid stenosis. Aortic Valve: The aortic valve is normal in structure. Aortic valve regurgitation is not visualized. No aortic stenosis is present. Pulmonic Valve: The pulmonic valve was normal in structure. Pulmonic valve regurgitation is not  visualized. No evidence of pulmonic stenosis. Aorta: The aortic root is normal in size and structure. Venous: The inferior vena cava is normal in size with greater than 50% respiratory variability, suggesting right atrial pressure of 3 mmHg. IAS/Shunts: No atrial level shunt detected by color flow Doppler.  LEFT VENTRICLE PLAX 2D LVIDd:         4.40 cm     Diastology LVIDs:         3.20 cm     LV e' medial:    10.00 cm/s LV PW:         0.90 cm     LV E/e' medial:  7.5 LV IVS:        0.90 cm     LV e' lateral:   11.30 cm/s LVOT diam:     2.50 cm     LV E/e' lateral: 6.7 LV SV:         48 LV SV Index:   21 LVOT Area:     4.91 cm  LV Volumes (MOD) LV vol d, MOD A4C: 80.2 ml LV vol s, MOD A4C: 30.4 ml LV SV MOD A4C:     80.2 ml LEFT ATRIUM  Index LA diam:      2.70 cm 1.19 cm/m LA Vol (A4C): 16.3 ml 7.19 ml/m  AORTIC VALVE LVOT Vmax:   50.20 cm/s LVOT Vmean:  36.700 cm/s LVOT VTI:    0.099 m  AORTA Ao Root diam: 3.40 cm MITRAL VALVE               TRICUSPID VALVE MV Area (PHT): 5.38 cm    TR Peak grad:   22.8 mmHg MV Decel Time: 141 msec    TR Vmax:        239.00 cm/s MV E velocity: 75.40 cm/s MV A velocity: 36.00 cm/s  SHUNTS MV E/A ratio:  2.09        Systemic VTI:  0.10 m                            Systemic Diam: 2.50 cm Dani Gobble Croitoru MD Electronically signed by Sanda Klein MD Signature Date/Time: 11/30/2020/12:30:40 PM    Final    VAS [place] LOWER EXTREMITY VENOUS (DVT)  Result Date: 12/10/2020  Lower Venous DVT Study Indications: Pain.  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 Comparison Study: Previous 12/02/20 Negative Performing Technologist: Vonzell Schlatter RVT  Examination Guidelines: A complete evaluation includes B-mode imaging, spectral Doppler, color Doppler, and power Doppler as needed of all accessible portions of each vessel. Bilateral testing is considered an integral part of a complete examination. Limited examinations for reoccurring  indications may be performed as noted. The reflux portion of the exam is performed with the patient in reverse Trendelenburg.  +---------+---------------+---------+-----------+----------+--------------+ RIGHT    CompressibilityPhasicitySpontaneityPropertiesThrombus Aging +---------+---------------+---------+-----------+----------+--------------+ CFV      Full           Yes      Yes                                 +---------+---------------+---------+-----------+----------+--------------+ SFJ      Full                                                        +---------+---------------+---------+-----------+----------+--------------+ FV Prox  Full                                                        +---------+---------------+---------+-----------+----------+--------------+ FV Mid   Full                                                        +---------+---------------+---------+-----------+----------+--------------+ FV DistalFull                                                        +---------+---------------+---------+-----------+----------+--------------+ PFV      Full                                                        +---------+---------------+---------+-----------+----------+--------------+  POP      Full           Yes      Yes                                 +---------+---------------+---------+-----------+----------+--------------+ PTV      None                                                        +---------+---------------+---------+-----------+----------+--------------+ PERO     None                                                        +---------+---------------+---------+-----------+----------+--------------+   +---------+---------------+---------+-----------+----------+--------------+ LEFT     CompressibilityPhasicitySpontaneityPropertiesThrombus Aging  +---------+---------------+---------+-----------+----------+--------------+ CFV      Full           Yes      Yes                                 +---------+---------------+---------+-----------+----------+--------------+ SFJ      Full                                                        +---------+---------------+---------+-----------+----------+--------------+ FV Prox  Full                                                        +---------+---------------+---------+-----------+----------+--------------+ FV Mid   Full                                                        +---------+---------------+---------+-----------+----------+--------------+ FV DistalFull                                                        +---------+---------------+---------+-----------+----------+--------------+ PFV      Full                                                        +---------+---------------+---------+-----------+----------+--------------+ POP      Full           Yes      Yes                                 +---------+---------------+---------+-----------+----------+--------------+  PTV      Full                                                        +---------+---------------+---------+-----------+----------+--------------+ PERO     Full                                                        +---------+---------------+---------+-----------+----------+--------------+     Summary: RIGHT: - Findings consistent with acute deep vein thrombosis involving one right posterior tibial vein, and both right peroneal veins. - No cystic structure found in the popliteal fossa.  LEFT: - There is no evidence of deep vein thrombosis in the lower extremity.  - No cystic structure found in the popliteal fossa.  *See table(s) above for measurements and observations. Electronically signed by Jamelle Haring on 12/10/2020 at 7:16:00 PM.    Final    VAS [place] LOWER EXTREMITY VENOUS  (DVT)  Result Date: 12/03/2020  Lower Venous DVT Study Indications: Pain.  Comparison Study: Previous 12/2019 Performing Technologist: Vonzell Schlatter RVT  Examination Guidelines: A complete evaluation includes B-mode imaging, spectral Doppler, color Doppler, and power Doppler as needed of all accessible portions of each vessel. Bilateral testing is considered an integral part of a complete examination. Limited examinations for reoccurring indications may be performed as noted. The reflux portion of the exam is performed with the patient in reverse Trendelenburg.  +---------+---------------+---------+-----------+----------+--------------+ RIGHT    CompressibilityPhasicitySpontaneityPropertiesThrombus Aging +---------+---------------+---------+-----------+----------+--------------+ CFV      Full           Yes      Yes                                 +---------+---------------+---------+-----------+----------+--------------+ SFJ      Full                                                        +---------+---------------+---------+-----------+----------+--------------+ FV Prox  Full                                                        +---------+---------------+---------+-----------+----------+--------------+ FV Mid   Full                                                        +---------+---------------+---------+-----------+----------+--------------+ FV DistalFull                                                        +---------+---------------+---------+-----------+----------+--------------+  PFV      Full                                                        +---------+---------------+---------+-----------+----------+--------------+ POP      Full           Yes      Yes                                 +---------+---------------+---------+-----------+----------+--------------+ PTV      Full                                                         +---------+---------------+---------+-----------+----------+--------------+ PERO     Full                                                        +---------+---------------+---------+-----------+----------+--------------+   +---------+---------------+---------+-----------+----------+--------------+ LEFT     CompressibilityPhasicitySpontaneityPropertiesThrombus Aging +---------+---------------+---------+-----------+----------+--------------+ CFV      Full           Yes      Yes                                 +---------+---------------+---------+-----------+----------+--------------+ SFJ      Full                                                        +---------+---------------+---------+-----------+----------+--------------+ FV Prox  Full                                                        +---------+---------------+---------+-----------+----------+--------------+ FV Mid   Full                                                        +---------+---------------+---------+-----------+----------+--------------+ FV DistalFull                                                        +---------+---------------+---------+-----------+----------+--------------+ PFV      Full                                                        +---------+---------------+---------+-----------+----------+--------------+  POP      Full           Yes      Yes                                 +---------+---------------+---------+-----------+----------+--------------+ PTV      Full                                                        +---------+---------------+---------+-----------+----------+--------------+ PERO     Full                                                        +---------+---------------+---------+-----------+----------+--------------+     Summary: RIGHT: - There is no evidence of deep vein thrombosis in the lower extremity.  - No cystic structure found in  the popliteal fossa.  LEFT: - There is no evidence of deep vein thrombosis in the lower extremity.  - No cystic structure found in the popliteal fossa.  *See table(s) above for measurements and observations. Electronically signed by Fabienne Bruns MD on 12/03/2020 at 12:15:41 PM.    Final    ECHOCARDIOGRAM LIMITED  Result Date: 12/10/2020    ECHOCARDIOGRAM LIMITED REPORT   Patient Name:   Glenn Gonzalez Date of Exam: 12/10/2020 Medical Rec #:  [id number]         Height:       74.0 in Accession #:    [id number]        Weight:       230.4 lb Date of Birth:  [birth year]        BSA:          2.308 m Patient Age:    59 years          BP:           105/72 mmHg Patient Gender: M                 HR:           99 bpm. Exam Location:  Inpatient Procedure: Limited Echo, Cardiac Doppler and Color Doppler Indications:    Acute DVT  History:        Patient has prior history of Echocardiogram examinations, most                 recent 11/30/2020. Arrythmias:Atrial Fibrillation. CKD. GERD.                 S/P heart transplant 01/22/16.  Sonographer:    Ross Ludwig RDCS (AE) Referring Phys: [id number] [facility] D NETTEY  Sonographer Comments: Technically difficult study due to poor echo windows, suboptimal apical window and suboptimal subcostal window. Unable to roll patient due to herniated discs. IMPRESSIONS  1. S/P Heart Transplant. Left ventricular ejection fraction, by estimation, is 60 to 65%. The left ventricle has normal function. Left ventricular endocardial border not optimally defined to evaluate regional wall motion. There is moderate concentric left ventricular hypertrophy. Left ventricular diastolic function could not be evaluated.  2. Right ventricular systolic function is moderately reduced. The right ventricular size is mildly enlarged.  Tricuspid regurgitation signal is inadequate for assessing PA pressure.  3. A small pericardial effusion is present. The pericardial effusion is surrounding the apex.  4. Tricuspid valve  regurgitation is mild to moderate. FINDINGS  Left Ventricle: S/P Heart Transplant. Left ventricular ejection fraction, by estimation, is 60 to 65%. The left ventricle has normal function. Left ventricular endocardial border not optimally defined to evaluate regional wall motion. There is moderate concentric left ventricular hypertrophy. Left ventricular diastolic function could not be evaluated. Right Ventricle: The right ventricular size is mildly enlarged. Right ventricular systolic function is moderately reduced. Tricuspid regurgitation signal is inadequate for assessing PA pressure. Pericardium: A small pericardial effusion is present. The pericardial effusion is surrounding the apex. Tricuspid Valve: Tricuspid valve regurgitation is mild to moderate. Venous: The inferior vena cava was not well visualized. 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  Molecular studies pending. -12/24/2020 CT chest/abdomen/pelvis without contrast showed "1. Retroperitoneal adenopathy and perinephric nodularity with dilation of the RIGHT renal pelvis and collecting systems, filled with what is suspected to represent neoplasm. 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 Differential considerations given heart transplantation and  presumed immunosuppression would include posttransplant lymphoproliferative disorder/lymphoma. 2. Dense material in collecting systems may represent a mixture of tumor and or blood and there is profound dilation of collecting systems of the RIGHT kidney. This is unchanged compared to the recent comparison study. 3. Signs of hepatic involvement with new lesions in the RIGHT hepatic lobe. 4. Ascites with small amount of ascites with very subtle infiltration of fat in the omentum not mentioned above, for instance on image 50 of series 2 raising the question of peritoneal involvement as well. This area is in the range of 2-3 mm. 5. Small RIGHT pleural effusion and basilar airspace disease. 6. Postoperative changes about the chest likely related to prior heart transplantation with surgical clips in the RIGHT axilla and thickening and calcification along the anterior pectoralis musculature not changed since 2018. 7. Calcification in the LEFT atrium also about the aortic root and pulmonary artery and to a lesser degree in the RIGHT atrium likely reflects changes of vascular anastomotic sites in the setting of heart transplantation. 8. 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 9. Post splenectomy. 10. Aortic atherosclerosis. 11. Small nodule in the LEFT upper lobe 6 mm airways are patent."  2) Back pain secondary to #1  3) Hyponatremia  4) Leukocytosis and Thrombocytosis  5) CKD  6) High-grade T1 bladder cancer  7) History of Hodgkin's lymphoma  8) status post heart transplant  PLAN: -Biopsy has resulted and results of been discussed with the patient.  Biopsy showed poorly differentiated carcinoma and based on pathology, differential includes poorly differentiated urothelial carcinoma.  Molecular studies and PD-L1 testing are pending. -He is aware that this is an incurable condition. -His chronic immunosuppression for  his cardiac transplantation status he has an additional complicating factor that might limit immunotherapy options. -We discussed that in the right setting the standard treatment would be cisplatin gemcitabine followed by avelumab maintenance.  He is not a good candidate for cisplatin given his renal function at this time and immunotherapy may not be an option given his need for chronic immunosuppression and risk for cardiac rejection with immunotherapy and the lack of its efficacy with T-cell immune suppression.  Additionally, given his ongoing renal insufficiency, carboplatin is not an ideal option. -Discussed proceeding with Padcev.  Adverse effects have been discussed with the patient and he agrees to proceed. -We have a referral into Dr. Lawanna Kobus at [facility] to request a telephone consultation for a second opinion. -Palliative care continues to follow for pain management and goals of care discussion.  Pain seems to be improving with fentanyl patch at a low dose. -We can arrange for outpatient follow-up at the cancer center.     LOS: 7 days   Mikey Bussing, DNP, AGPCNP-BC, AOCNP 12/14/20     Addendum  .Patient was Personally and independently interviewed, examined and relevant elements of the history of present illness were reviewed in details and an assessment and plan was created. All elements of the patient's history of present illness , assessment and plan were discussed in details with Mikey Bussing, DNP, AGPCNP-BC, AOCNP. The above documentation reflects our combined findings assessment and plan.   Pain better controlled today. Patient able to get some rest. Receiving Padcev C1D1 dose today. No other acute new symptoms On IV Heparin for LE calf DVT likely triggered by malignancy and immobility. Continue IV heparin -- if renal function stable could transition to lovenox $RemoveBe'1mg'LnzMjpZGb$ /kg once daily and check Xa levels to adjust dose.  Appreciate excellent hospital medicine and nursing  cares.  Sullivan Lone MD MS

## 2020-12-14 NOTE — Progress Notes (Signed)
PROGRESS NOTE    Glenn Gonzalez  NAT:[id number] DOB: 1960-12-10 DOA: 12/04/2020 PCP: Lajean Manes, MD     Brief Narrative:  Glenn Gonzalez is a 60 y.o. year old male with medical history significant for Hodgkin lymphoma, cardiac transplant on tacrolimus and prednisone, chronic combined systolic/diastolic CHF, GERD, high-grade T1 bladder cancer with recent hospitalization from 42/70-6/2 for AKI complicated by hydronephrosis in the setting of new retroperitoneal lymphadenopathy. Patient underwent stent placement by urology and was treated for postoperative multifocal pneumonia.  He presented from urology office with reports of increased back pain not well controlled on oral pain medication as well as generalized fatigue, poor appetite. He was brought in under observation due to concern for back pain in the setting of known retroperitoneal lymphadenopathy.  Hospital course complicated by acute hyponatremia in the setting of poor appetite requiring IV fluids and persistent leukocytosis.  CT abdomen imaging consistent with retroperitoneal adenopathy and dilation of right renal pelvis and collecting systems concerning for urothelial neoplasm with extensive metastatic process. Found to have acute right lower extremity DVT on venous duplex started on heparin on 1/10.   New events last 24 hours / Subjective: Pain seems to be better controlled with fentanyl patch as well as oral Dilaudid.  Appetite still remains poor  Assessment & Plan:   Active Problems:   SYSTOLIC HEART FAILURE, CHRONIC   Acute kidney injury superimposed on CKD (HCC)   Hypothyroidism   Hyponatremia   Back pain   Retroperitoneal lymphadenopathy   Liver lesion   Acute on chronic combined systolic and diastolic CHF (congestive heart failure) (HCC)   Acute DVT of right tibial vein (HCC)   Retroperitoneal hemorrhage   Metastatic urothelial carcinoma (HCC)   Neoplasm related pain   Retroperitoneal adenopathy and  perinephric nodularity -Status post IR guided retroperitoneal biopsy, pathology positive for poorly differentiated carcinoma -Urology following -Oncology following, sent for molecular testing and hoping to discuss with [facility] regarding patient's options -Appreciate palliative medicine assistance with symptom management as well as constipation -Starting on palliative chemo  AKI on CKD stage 3a -Most recent baseline creatinine 1.8 from last hospitalization, baseline creatinine 1.2-1.4 from October 2021 -Suspect lymphatic obstruction due to malignancy, low albumin -Continues to worsen without reversible treatable entity, hopefully will plateau -Nephrology following  Acute right lower extremity DVT -Remains on IV heparin   Small retroperitoneal hemorrhage status post biopsy -Continue to trend CBC.  Repeat CT abdomen if drop in hemoglobin or hypotensive while on IV heparin  Acute on chronic hyponatremia -Initially improved with IV fluid and thought to be due to diminished p.o. intake but now patient is volume overloaded -Nephrology following  Small pericardial effusion -Incidentally noted on echocardiogram, without tamponade physiology  Chronic combined systolic and diastolic heart failure -Patient is status post heart transplant and EF has now corrected to 60 to 65% -Has failed trial of IV Lasix -TED hose  History of cardiac transplant -Continue tacrolimus -Change prednisone to dexamethasone per Dr. Cinda Quest recommendation as this may help with patient's energy  -Per patient's request, I attempted to get in touch with Dr. Eleanora Neighbor team at [facility] to discuss his cardiac transplant medications.  Despite numerous attempts, I could not get through to their answering service.  Hypothyroidism -Continue Synthroid     DVT prophylaxis: IV heparin Place TED hose Start: 12/11/20 1137 SCDs Start: 12/11/2020 1712  Code Status: DNR, confirmed by Dr. Sonny Dandy and myself  Family Communication:  None at bedside Disposition Plan:  Status is: Inpatient  Remains inpatient appropriate because:Inpatient level of care appropriate due to severity of illness   Dispo: The patient is from: Home              Anticipated d/c is to: Home              Anticipated d/c date is: > 3 days              Patient currently is not medically stable to d/c.  Starting on palliative chemotherapy.  Once his pain regimen is better established, kidney function remained stable, hopeful to discharge home with home health.   Antimicrobials:  Anti-infectives (From admission, onward)   Start     Dose/Rate Route Frequency Ordered Stop   12/20/2020 2200  cefdinir (OMNICEF) capsule 300 mg        300 mg Oral 2 times daily 12/22/2020 1716 12/10/20 2159       Objective: Vitals:   12/13/20 0551 12/13/20 1330 12/13/20 2022 12/14/20 0430  BP: 118/81 126/87 120/79 125/88  Pulse: 89 (!) 101 94 97  Resp: 16 18 16 16   Temp: 97.8 F (36.6 C) 98 F (36.7 C) 99.1 F (37.3 C) 97.6 F (36.4 C)  TempSrc: Oral Oral Oral Oral  SpO2: 95% 95% 95% 93%  Weight:      Height:        Intake/Output Summary (Last 24 hours) at 12/14/2020 1211 Last data filed at 12/14/2020 1054 Gross per 24 hour  Intake 1137.32 ml  Output 100 ml  Net 1037.32 ml   Filed Weights   12/10/20 0610 12/10/20 1553 12/10/20 1804  Weight: 104.5 kg 106.2 kg 109.6 kg    Examination: General exam: Appears calm and comfortable, ill-appearing Respiratory system: Clear to auscultation. Respiratory effort normal. Cardiovascular system: S1 & S2 heard, RRR.  Bilateral pedal edema. Gastrointestinal system: Abdomen is nondistended, soft and nontender. Normal bowel sounds heard. Central nervous system: Alert and oriented. Non focal exam. Speech clear  Extremities: Symmetric in appearance bilaterally  Skin: No rashes, lesions or ulcers on exposed skin  Psychiatry: Judgement and insight appear stable. Mood & affect appropriate.   Data Reviewed: I have  personally reviewed following labs and imaging studies  CBC: Recent Labs  Lab 12/09/20 0845 12/11/20 0604 12/12/20 0616 12/13/20 0657 12/14/20 0717  WBC 17.7* 20.7* 21.4* 23.5* 25.0*  HGB 12.9* 13.0 13.2 13.8 13.7  HCT 38.6* 38.1* 40.1 41.2 39.9  MCV 96.0 94.1 95.9 94.9 92.4  PLT 463* 404* 408* 444* 976*   Basic Metabolic Panel: Recent Labs  Lab 12/10/20 0449 12/11/20 0604 12/12/20 0616 12/13/20 0657 12/14/20 0717  NA 126* 126* 126* 124* 125*  K 4.7 4.6 4.3 4.9 4.9  CL 95* 93* 91* 90* 91*  CO2 20* 22 21* 20* 22  GLUCOSE 94 105* 108* 124* 117*  BUN 39* 47* 55* 65* 74*  CREATININE 2.35* 2.84* 3.17* 3.25* 3.48*  CALCIUM 9.1 9.6 9.3 9.2 9.1   GFR: Estimated Creatinine Clearance: 30.1 mL/min (A) (by C-G formula based on SCr of 3.48 mg/dL (H)). Liver Function Tests: Recent Labs  Lab 12/09/20 1334 12/10/20 0449 12/11/20 0604 12/12/20 0616  AST  --  19 19 20   ALT  --  9 10 9   ALKPHOS  --  59 60 60  BILITOT  --  0.5 0.5 0.8  PROT  --  5.2* 5.5* 5.8*  ALBUMIN 3.0* 2.6* 2.5* 2.8*   No results for input(s): LIPASE, AMYLASE in the last 168 hours. No results for input(s):  AMMONIA in the last 168 hours. Coagulation Profile: No results for input(s): INR, PROTIME in the last 168 hours. Cardiac Enzymes: No results for input(s): CKTOTAL, CKMB, CKMBINDEX, TROPONINI in the last 168 hours. BNP (last 3 results) No results for input(s): PROBNP in the last 8760 hours. HbA1C: No results for input(s): HGBA1C in the last 72 hours. CBG: No results for input(s): GLUCAP in the last 168 hours. Lipid Profile: No results for input(s): CHOL, HDL, LDLCALC, TRIG, CHOLHDL, LDLDIRECT in the last 72 hours. Thyroid Function Tests: No results for input(s): TSH, T4TOTAL, FREET4, T3FREE, THYROIDAB in the last 72 hours. Anemia Panel: No results for input(s): VITAMINB12, FOLATE, FERRITIN, TIBC, IRON, RETICCTPCT in the last 72 hours. Sepsis Labs: No results for input(s): PROCALCITON,  LATICACIDVEN in the last 168 hours.  Recent Results (from the past 240 hour(s))  SARS CORONAVIRUS 2 (TAT 6-24 HRS) Nasopharyngeal Nasopharyngeal Swab     Status: None   Collection Time: 12/13/2020  1:48 PM   Specimen: Nasopharyngeal Swab  Result Value Ref Range Status   SARS Coronavirus 2 NEGATIVE NEGATIVE Final    Comment: (NOTE) SARS-CoV-2 target nucleic acids are NOT DETECTED.  The SARS-CoV-2 RNA is generally detectable in upper and lower respiratory specimens during the acute phase of infection. Negative results do not preclude SARS-CoV-2 infection, do not rule out co-infections with other pathogens, and should not be used as the sole basis for treatment or other patient management decisions. Negative results must be combined with clinical observations, patient history, and epidemiological information. The expected result is Negative.  Fact Sheet for Patients: SugarRoll.be  Fact Sheet for Healthcare Providers: https://www.woods-mathews.com/  This test is not yet approved or cleared by the [place] FDA and  has been authorized for detection and/or diagnosis of SARS-CoV-2 by FDA under an Emergency Use Authorization (EUA). This EUA will remain  in effect (meaning this test can be used) for the duration of the COVID-19 declaration under Se ction 564(b)(1) of the Act, 21 U.S.C. section 360bbb-3(b)(1), unless the authorization is terminated or revoked sooner.  Performed at [facility], [place] [address], [place], [place] [address]       Radiology Studies: No results found.    Scheduled Meds: . allopurinol  200 mg Oral Daily  . azelastine  1 spray Each Nare Daily   And  . fluticasone  1 spray Each Nare Daily  . dexamethasone (DECADRON) injection  5 mg Intravenous Once  . dexamethasone  4 mg Oral BID WC  . docusate sodium  100 mg Oral BID  . enfortumab vedotin-ejfv (PADCEV) CHEMO IV infusion  1 mg/kg (Treatment Plan  Recorded) Intravenous Once  . fentaNYL  1 patch Transdermal Q72H  . lactulose  10 g Oral BID  . levothyroxine  125 mcg Oral Daily  . loratadine  10 mg Oral Daily  . metoCLOPramide  5 mg Oral TID AC & HS  . palonosetron  0.25 mg Intravenous Once  . pantoprazole  40 mg Oral BID  . senna-docusate  2 tablet Oral BID  . sucralfate  1 g Oral BID AC & HS  . Tacrolimus ER  1.5 mg Oral Daily  . traZODone  100 mg Oral QHS   Continuous Infusions: . sodium chloride    . heparin 1,600 Units/hr (12/14/20 1000)     LOS: 7 days      Time spent: 35 minutes   Dessa Phi, DO [facility] 12/14/2020, 12:11 PM   Available via Epic secure chat 7am-7pm After  these hours, please refer to coverage provider listed on [email or web address]

## 2020-12-14 NOTE — Telephone Encounter (Signed)
At Dr.Kale's request, contacted [facility] [phone] to provide referral for patient:" telephone consultation with Dr Lawanna Kobus @ [facility] for input regarding management of metastatic urothelial cancer from the rt renal pelvis in context of chronic immunosuppression for cardiac transplantation. Remote h/o hodgkins lymphoma". Per Caryl Pina, fax patient demographics and most recent MD note to [phone] - Attn: New Patient Coordinator. They will contact patient. Faxed documents as requested - fax confirmation received.

## 2020-12-14 NOTE — Progress Notes (Signed)
START OFF PATHWAY REGIMEN - Bladder   [id number]:Enfortumab vedotin-ejfv 1.25 mg/kg IV D1,8,15 q28 Days:   A cycle is every 28 days:     Enfortumab vedotin-ejfv   **Always confirm dose/schedule in your pharmacy ordering system**  Patient Characteristics: Advanced/Metastatic Disease, First Line, No Prior Platinum-Based Therapy, Poor Renal Function (CrCl < 50 mL/min), Unknown PD-L1 Expression Therapeutic Status: Advanced/Metastatic Disease Line of Therapy: First Line Prior Platinum-Based Therapy<= No Renal Function: Poor Renal Function (CrCl < 50 mL/min) PD-L1 Expression Status: Unknown PD-L1 Expression Intent of Therapy: Non-Curative / Palliative Intent, Discussed with Patient

## 2020-12-15 DIAGNOSIS — Z5111 Encounter for antineoplastic chemotherapy: Secondary | ICD-10-CM

## 2020-12-15 DIAGNOSIS — K5903 Drug induced constipation: Secondary | ICD-10-CM

## 2020-12-15 DIAGNOSIS — C7910 Secondary malignant neoplasm of unspecified urinary organs: Secondary | ICD-10-CM | POA: Diagnosis not present

## 2020-12-15 DIAGNOSIS — G893 Neoplasm related pain (acute) (chronic): Secondary | ICD-10-CM | POA: Diagnosis not present

## 2020-12-15 DIAGNOSIS — C801 Malignant (primary) neoplasm, unspecified: Secondary | ICD-10-CM | POA: Diagnosis not present

## 2020-12-15 DIAGNOSIS — T402X5A Adverse effect of other opioids, initial encounter: Secondary | ICD-10-CM

## 2020-12-15 DIAGNOSIS — Z515 Encounter for palliative care: Secondary | ICD-10-CM | POA: Diagnosis not present

## 2020-12-15 LAB — URINALYSIS, ROUTINE W REFLEX MICROSCOPIC
Bilirubin Urine: NEGATIVE
Glucose, UA: NEGATIVE mg/dL
Ketones, ur: NEGATIVE mg/dL
Nitrite: NEGATIVE
Protein, ur: 30 mg/dL — AB
RBC / HPF: >50 RBC/hpf — ABNORMAL HIGH (ref 0–5)
Specific Gravity, Urine: 1.011 (ref 1.005–1.030)
pH: 5 (ref 5.0–8.0)

## 2020-12-15 LAB — HEPARIN LEVEL (UNFRACTIONATED)
Heparin Unfractionated: 1.08 IU/mL — ABNORMAL HIGH (ref 0.30–0.70)
Heparin Unfractionated: 1.06 IU/mL — ABNORMAL HIGH (ref 0.30–0.70)

## 2020-12-15 LAB — CBC
HCT: 41.7 % (ref 39.0–52.0)
Hemoglobin: 14.1 g/dL (ref 13.0–17.0)
MCH: 31.8 pg (ref 26.0–34.0)
MCHC: 33.8 g/dL (ref 30.0–36.0)
MCV: 94.1 fL (ref 80.0–100.0)
Platelets: 472 10*3/uL — ABNORMAL HIGH (ref 150–400)
RBC: 4.43 MIL/uL (ref 4.22–5.81)
RDW: 15.5 % (ref 11.5–15.5)
WBC: 19.9 10*3/uL — ABNORMAL HIGH (ref 4.0–10.5)
nRBC: 0.3 % — ABNORMAL HIGH (ref 0.0–0.2)

## 2020-12-15 LAB — BASIC METABOLIC PANEL
Anion gap: 16 — ABNORMAL HIGH (ref 5–15)
BUN: 87 mg/dL — ABNORMAL HIGH (ref 6–20)
CO2: 19 mmol/L — ABNORMAL LOW (ref 22–32)
Calcium: 9.2 mg/dL (ref 8.9–10.3)
Chloride: 90 mmol/L — ABNORMAL LOW (ref 98–111)
Creatinine, Ser: 3.51 mg/dL — ABNORMAL HIGH (ref 0.61–1.24)
GFR, Estimated: 19 mL/min — ABNORMAL LOW (ref >60)
Glucose, Bld: 122 mg/dL — ABNORMAL HIGH (ref 70–99)
Potassium: 5.4 mmol/L — ABNORMAL HIGH (ref 3.5–5.1)
Sodium: 125 mmol/L — ABNORMAL LOW (ref 135–145)

## 2020-12-15 LAB — OSMOLALITY, URINE: Osmolality, Ur: 418 mOsm/kg (ref 300–900)

## 2020-12-15 MED ORDER — HEPARIN (PORCINE) 25000 UT/250ML-% IV SOLN
1350.0000 [IU]/h | INTRAVENOUS | Status: DC
  Administered 2020-12-16: 1100 [IU]/h via INTRAVENOUS
  Administered 2020-12-17: 950 [IU]/h via INTRAVENOUS
  Administered 2020-12-18: 1100 [IU]/h via INTRAVENOUS
  Administered 2020-12-19 – 2020-12-20 (×2): 1400 [IU]/h via INTRAVENOUS
  Administered 2020-12-20: 1500 [IU]/h via INTRAVENOUS
  Administered 2020-12-21 – 2020-12-22 (×2): 1550 [IU]/h via INTRAVENOUS
  Administered 2020-12-23: 1500 [IU]/h via INTRAVENOUS
  Administered 2020-12-23 – 2020-12-24 (×2): 1000 [IU]/h via INTRAVENOUS
  Administered 2020-12-25: 1150 [IU]/h via INTRAVENOUS
  Administered 2020-12-26 – 2021-01-01 (×8): 1350 [IU]/h via INTRAVENOUS
  Filled 2020-12-15 (×26): qty 250

## 2020-12-15 MED ORDER — SENNOSIDES-DOCUSATE SODIUM 8.6-50 MG PO TABS
3.0000 | ORAL_TABLET | Freq: Two times a day (BID) | ORAL | Status: DC
  Administered 2020-12-15 – 2020-12-26 (×15): 3 via ORAL
  Filled 2020-12-15 (×18): qty 3

## 2020-12-15 MED ORDER — HEPARIN (PORCINE) 25000 UT/250ML-% IV SOLN
1350.0000 [IU]/h | INTRAVENOUS | Status: DC
  Administered 2020-12-15: 1350 [IU]/h via INTRAVENOUS
  Filled 2020-12-15: qty 250

## 2020-12-15 MED ORDER — LORATADINE 10 MG PO TABS: 10.0000 mg | ORAL_TABLET | Freq: Every day | ORAL | Status: DC

## 2020-12-15 MED ORDER — HYDROMORPHONE HCL 1 MG/ML IJ SOLN
1.5000 mg | INTRAMUSCULAR | Status: DC | PRN
  Administered 2020-12-15 – 2021-01-01 (×28): 1.5 mg via INTRAVENOUS
  Filled 2020-12-15 (×29): qty 2

## 2020-12-15 MED ORDER — PANTOPRAZOLE SODIUM 40 MG PO TBEC
40.0000 mg | DELAYED_RELEASE_TABLET | Freq: Two times a day (BID) | ORAL | Status: DC | PRN
  Administered 2020-12-31: 40 mg via ORAL
  Filled 2020-12-15 (×2): qty 1

## 2020-12-15 MED ORDER — HYDROMORPHONE HCL 2 MG/ML IJ SOLN
1.5000 mg | INTRAMUSCULAR | Status: DC | PRN
  Administered 2020-12-15: 1.5 mg via INTRAVENOUS
  Filled 2020-12-15: qty 1

## 2020-12-15 MED ORDER — CETIRIZINE HCL 10 MG PO TABS
10.0000 mg | ORAL_TABLET | Freq: Every day | ORAL | Status: DC
  Administered 2020-12-16 – 2020-12-28 (×13): 10 mg via ORAL
  Filled 2020-12-15 (×18): qty 1

## 2020-12-15 MED ORDER — FENTANYL 12 MCG/HR TD PT72
2.0000 | MEDICATED_PATCH | TRANSDERMAL | Status: AC
  Administered 2020-12-15: 2 via TRANSDERMAL
  Filled 2020-12-15 (×2): qty 2

## 2020-12-15 MED ORDER — FUROSEMIDE 10 MG/ML IJ SOLN
80.0000 mg | Freq: Three times a day (TID) | INTRAMUSCULAR | Status: DC
  Administered 2020-12-15 – 2020-12-16 (×3): 80 mg via INTRAVENOUS
  Filled 2020-12-15 (×3): qty 8

## 2020-12-15 NOTE — Progress Notes (Signed)
PROGRESS NOTE    Glenn Gonzalez  IHK:[id number] DOB: June 03, 1961 DOA: 12/31/2020 PCP: Lajean Manes, MD     Brief Narrative:  Glenn Gonzalez is a 60 y.o. year old male with medical history significant for Hodgkin lymphoma, cardiac transplant on tacrolimus and prednisone, chronic combined systolic/diastolic CHF, GERD, high-grade T1 bladder cancer with recent hospitalization from 75/64-3/3 for AKI complicated by hydronephrosis in the setting of new retroperitoneal lymphadenopathy. Patient underwent stent placement by urology and was treated for postoperative multifocal pneumonia.  He presented from urology office with reports of increased back pain not well controlled on oral pain medication as well as generalized fatigue, poor appetite. He was brought in under observation due to concern for back pain in the setting of known retroperitoneal lymphadenopathy.  Hospital course complicated by acute hyponatremia in the setting of poor appetite requiring IV fluids and persistent leukocytosis.  CT abdomen imaging consistent with retroperitoneal adenopathy and dilation of right renal pelvis and collecting systems concerning for urothelial neoplasm with extensive metastatic process. Found to have acute right lower extremity DVT on venous duplex started on heparin on 1/10.   New events last 24 hours / Subjective: Continues to have edema. Discussed Dr. Barkley Bruns earlier assessment, due to patient's worsening kidney function and edema, patient has overall very poor prognosis. We could trial HD vs transition to hospice.   Dr. Sonny Dandy, patient's previous oncologist now-retired, is at bedside. He tells the patient that patient has 50% chance of improvement in his oncologic disease process and 20% chance of total cure, base on his literature research. I re-iterated Dr. Grier Mitts previous discussion that this is NOT a curable disease and chemo is for palliation only. We could trial HD to give patient more time vs  hospice care.   Assessment & Plan:   Principal Problem:   Metastatic urothelial carcinoma (HCC) Active Problems:   SYSTOLIC HEART FAILURE, CHRONIC   Acute kidney injury superimposed on CKD (HCC)   Hypothyroidism   Hyponatremia   Back pain   Retroperitoneal lymphadenopathy   Liver lesion   Acute on chronic combined systolic and diastolic CHF (congestive heart failure) (HCC)   Acute DVT of right tibial vein (HCC)   Retroperitoneal hemorrhage   Neoplasm related pain   Retroperitoneal adenopathy and perinephric nodularity -Status post IR guided retroperitoneal biopsy, pathology positive for poorly differentiated carcinoma -Urology following -Oncology following, sent for molecular testing and hoping to discuss with [facility] regarding patient's options -Appreciate palliative medicine assistance with symptom management as well as constipation -Started on palliative chemo 1/14   AKI on CKD stage 3a -Most recent baseline creatinine 1.8 from last hospitalization, baseline creatinine 1.2-1.4 from October 2021 -Suspect lymphatic obstruction due to malignancy, low albumin -Nephrology following, patient deciding on HD and leaning toward starting HD  -Lasix added today   Acute right lower extremity DVT -Remains on IV heparin   Small retroperitoneal hemorrhage status post biopsy -Hgb remains stable   Acute on chronic hyponatremia -Initially improved with IV fluid and thought to be due to diminished p.o. intake but now patient is volume overloaded -Nephrology following  Small pericardial effusion -Incidentally noted on echocardiogram, without tamponade physiology  Chronic combined systolic and diastolic heart failure -Patient is status post heart transplant and EF has now corrected to 60 to 65% -IV lasix   History of cardiac transplant -Continue tacrolimus -Change prednisone to dexamethasone per Dr. Cinda Quest recommendation as this may help with patient's energy  -Per patient's  request, I attempted to get in  touch with Dr. Eleanora Neighbor team at [facility] to discuss his cardiac transplant medications.  Despite numerous attempts, I could not get through to their answering service.  Hypothyroidism -Continue Synthroid     DVT prophylaxis: IV heparin Place TED hose Start: 12/11/20 1137 SCDs Start: 12/19/2020 1712  Code Status: DNR Family Communication: None at bedside Disposition Plan:  Status is: Inpatient  Remains inpatient appropriate because:Inpatient level of care appropriate due to severity of illness   Dispo: The patient is from: Home              Anticipated d/c is to: Home              Anticipated d/c date is: > 3 days              Patient currently is not medically stable to d/c.  Started on palliative chemotherapy. Patient leaning toward starting HD    Antimicrobials:  Anti-infectives (From admission, onward)   Start     Dose/Rate Route Frequency Ordered Stop   12/29/2020 2200  cefdinir (OMNICEF) capsule 300 mg        300 mg Oral 2 times daily 12/21/2020 1716 12/10/20 2159       Objective: Vitals:   12/14/20 0430 12/14/20 1855 12/14/20 2005 12/15/20 0521  BP: 125/88 123/83 124/86 121/82  Pulse: 97 87 85 88  Resp: 16 17 18 20   Temp: 97.6 F (36.4 C) 97.8 F (36.6 C) 97.9 F (36.6 C) 98.1 F (36.7 C)  TempSrc: Oral Oral Oral Oral  SpO2: 93% 92% 94% 94%  Weight:    107 kg  Height:        Intake/Output Summary (Last 24 hours) at 12/15/2020 1024 Last data filed at 12/15/2020 0601 Gross per 24 hour  Intake 440 ml  Output 750 ml  Net -310 ml   Filed Weights   12/10/20 1553 12/10/20 1804 12/15/20 0521  Weight: 106.2 kg 109.6 kg 107 kg    Examination: General exam: Appears calm and comfortable but ill-appearing  Respiratory system: Respiratory effort normal. Cardiovascular system: +Pedal edema. Central nervous system: Alert and oriented. Speech clear  Extremities: Symmetric in appearance bilaterally  Psychiatry: Judgement and insight appear  stable. Mood & affect appropriate.     Data Reviewed: I have personally reviewed following labs and imaging studies  CBC: Recent Labs  Lab 12/11/20 0604 12/12/20 0616 12/13/20 0657 12/14/20 0717 12/15/20 0755  WBC 20.7* 21.4* 23.5* 25.0* 19.9*  HGB 13.0 13.2 13.8 13.7 14.1  HCT 38.1* 40.1 41.2 39.9 41.7  MCV 94.1 95.9 94.9 92.4 94.1  PLT 404* 408* 444* 464* 161*   Basic Metabolic Panel: Recent Labs  Lab 12/11/20 0604 12/12/20 0616 12/13/20 0657 12/14/20 0717 12/15/20 0755  NA 126* 126* 124* 125* 125*  K 4.6 4.3 4.9 4.9 5.4*  CL 93* 91* 90* 91* 90*  CO2 22 21* 20* 22 19*  GLUCOSE 105* 108* 124* 117* 122*  BUN 47* 55* 65* 74* 87*  CREATININE 2.84* 3.17* 3.25* 3.48* 3.51*  CALCIUM 9.6 9.3 9.2 9.1 9.2   GFR: Estimated Creatinine Clearance: 29.5 mL/min (A) (by C-G formula based on SCr of 3.51 mg/dL (H)). Liver Function Tests: Recent Labs  Lab 12/09/20 1334 12/10/20 0449 12/11/20 0604 12/12/20 0616  AST  --  19 19 20   ALT  --  9 10 9   ALKPHOS  --  59 60 60  BILITOT  --  0.5 0.5 0.8  PROT  --  5.2* 5.5* 5.8*  ALBUMIN 3.0*  2.6* 2.5* 2.8*   No results for input(s): LIPASE, AMYLASE in the last 168 hours. No results for input(s): AMMONIA in the last 168 hours. Coagulation Profile: No results for input(s): INR, PROTIME in the last 168 hours. Cardiac Enzymes: No results for input(s): CKTOTAL, CKMB, CKMBINDEX, TROPONINI in the last 168 hours. BNP (last 3 results) No results for input(s): PROBNP in the last 8760 hours. HbA1C: No results for input(s): HGBA1C in the last 72 hours. CBG: No results for input(s): GLUCAP in the last 168 hours. Lipid Profile: No results for input(s): CHOL, HDL, LDLCALC, TRIG, CHOLHDL, LDLDIRECT in the last 72 hours. Thyroid Function Tests: No results for input(s): TSH, T4TOTAL, FREET4, T3FREE, THYROIDAB in the last 72 hours. Anemia Panel: No results for input(s): VITAMINB12, FOLATE, FERRITIN, TIBC, IRON, RETICCTPCT in the last 72  hours. Sepsis Labs: No results for input(s): PROCALCITON, LATICACIDVEN in the last 168 hours.  Recent Results (from the past 240 hour(s))  SARS CORONAVIRUS 2 (TAT 6-24 HRS) Nasopharyngeal Nasopharyngeal Swab     Status: None   Collection Time: 12/30/2020  1:48 PM   Specimen: Nasopharyngeal Swab  Result Value Ref Range Status   SARS Coronavirus 2 NEGATIVE NEGATIVE Final    Comment: (NOTE) SARS-CoV-2 target nucleic acids are NOT DETECTED.  The SARS-CoV-2 RNA is generally detectable in upper and lower respiratory specimens during the acute phase of infection. Negative results do not preclude SARS-CoV-2 infection, do not rule out co-infections with other pathogens, and should not be used as the sole basis for treatment or other patient management decisions. Negative results must be combined with clinical observations, patient history, and epidemiological information. The expected result is Negative.  Fact Sheet for Patients: SugarRoll.be  Fact Sheet for Healthcare Providers: https://www.woods-mathews.com/  This test is not yet approved or cleared by the [place] FDA and  has been authorized for detection and/or diagnosis of SARS-CoV-2 by FDA under an Emergency Use Authorization (EUA). This EUA will remain  in effect (meaning this test can be used) for the duration of the COVID-19 declaration under Se ction 564(b)(1) of the Act, 21 U.S.C. section 360bbb-3(b)(1), unless the authorization is terminated or revoked sooner.  Performed at [facility], [place] [address], [place], [place] [address]       Radiology Studies: No results found.    Scheduled Meds: . allopurinol  200 mg Oral Daily  . artificial tears  1 application Both Eyes QHS  . azelastine  1 spray Each Nare Daily   And  . fluticasone  1 spray Each Nare Daily  . dexamethasone  4 mg Oral BID WC  . docusate sodium  100 mg Oral BID  . fentaNYL  1 patch Transdermal  Q72H  . furosemide  80 mg Intravenous Q8H  . lactulose  10 g Oral BID  . levothyroxine  125 mcg Oral Daily  . loratadine  10 mg Oral Daily  . metoCLOPramide  5 mg Oral TID AC & HS  . polyvinyl alcohol  1 drop Both Eyes TID  . senna-docusate  2 tablet Oral BID  . sucralfate  1 g Oral BID AC & HS  . Tacrolimus ER  1.5 mg Oral Daily  . traZODone  100 mg Oral QHS   Continuous Infusions: . heparin 1,600 Units/hr (12/14/20 2020)     LOS: 8 days      Time spent: 35 minutes   Dessa Phi, DO [facility] 12/15/2020, 10:24 AM   Available via Epic secure chat 7am-7pm After  these hours, please refer to coverage provider listed on [email or web address]

## 2020-12-15 NOTE — Progress Notes (Addendum)
[place] for heparin Indication: acute DVT  Allergies  Allergen Reactions  . Digoxin Other (See Comments)    gynecomastia   . Phencyclidine Other (See Comments)    PCP derived antibiotic > Insomnia  . Spironolactone Other (See Comments)    Gynecomastia  . Oxybutynin Chloride     Other reaction(s): low BP  . Lactose Intolerance (Gi) Diarrhea and Other (See Comments)    cramps  . Penicillins Other (See Comments)    Cramps in stomach Did it involve swelling of the face/tongue/throat, SOB, or low BP? No Did it involve sudden or severe rash/hives, skin peeling, or any reaction on the inside of your mouth or nose? No Did you need to seek medical attention at a hospital or doctor's office? No When did it last happen?unknown If all above answers are "NO", may proceed with cephalosporin use.     . Sulfur Rash   Patient Measurements: Height: 6\' 2"  (188 cm) Weight: 107 kg (235 lb 14.3 oz) IBW/kg (Calculated) : 82.2 Heparin Dosing Weight: 102 kg  Vital Signs: Temp: 98.1 F (36.7 C) (01/15 0521) Temp Source: Oral (01/15 0521) BP: 121/82 (01/15 0521) Pulse Rate: 88 (01/15 0521)  Labs: Recent Labs    12/13/20 0657 12/14/20 0717 12/15/20 0755  HGB 13.8 13.7 14.1  HCT 41.2 39.9 41.7  PLT 444* 464* 472*  HEPARINUNFRC 0.59 0.72*  --   CREATININE 3.25* 3.48* 3.51*    Estimated Creatinine Clearance: 29.5 mL/min (A) (by C-G formula based on SCr of 3.51 mg/dL (H)).  Assessment: Patient is a 60 y.o M with hx heart transplant, Hodgkin lymphoma and bladder cancer who was recently hospitalized from 12/30 to 1/3 for PNA.  He also underwent cystoscopy with right ureteral stent placement on 11/29/20 for right hydronephrosis and was also found to have new retroperitoneal lymphadenopathy during this admission.  He went to see his urologist on 1/5 with c/o back pain and was sent to [facility] for further workup.  He underwent CT guided right  retroperitoneal lymph node biopsy on 1/6 with retroperitoneal hemorrhage noted on post-biopsy CT. DDimer was >20 on 1/10 with LE doppler positive for acute RLE DVT. Pharmacy is consulted to start heparin drip for acute VTE- no Bolus   HL =  1.08 - above goal on heparin rate of 1600 units/hr- probably accumulating d/t renal failure  CBC stable  SCr rising, up to 3.41  No reported bleeding or issues of note per RN   Goal of Therapy:  Heparin level 0.3-0.7 units/ml Monitor platelets by anticoagulation protocol: Yes   Plan:   Hold heparin x 1 hour, resume at lower rate of 1350 units/hr and check 8 hr heparin level  Monitor for s/sx bleeding  Daily CBC, heparin level  Eudelia Bunch, Pharm.D 12/15/2020 10:42 AM

## 2020-12-15 NOTE — Progress Notes (Signed)
Pt stated he was going to continue to take his home medications Rabeprazole for heartburn and his Zyrtec for his allergies. RN messaged MD on call to make aware due to not having an order for either med.   Eleanora Neighbor, RN

## 2020-12-15 NOTE — Plan of Care (Signed)
  Problem: Clinical Measurements: Goal: Diagnostic test results will improve Outcome: Progressing Goal: Respiratory complications will improve Outcome: Progressing   Problem: Activity: Goal: Risk for activity intolerance will decrease Outcome: Progressing   Problem: Nutrition: Goal: Adequate nutrition will be maintained Outcome: Progressing   

## 2020-12-15 NOTE — Progress Notes (Signed)
[place] for Heparin Indication: acute DVT  Allergies  Allergen Reactions  . Digoxin Other (See Comments)    gynecomastia   . Phencyclidine Other (See Comments)    PCP derived antibiotic > Insomnia  . Spironolactone Other (See Comments)    Gynecomastia  . Oxybutynin Chloride     Other reaction(s): low BP  . Lactose Intolerance (Gi) Diarrhea and Other (See Comments)    cramps  . Penicillins Other (See Comments)    Cramps in stomach Did it involve swelling of the face/tongue/throat, SOB, or low BP? No Did it involve sudden or severe rash/hives, skin peeling, or any reaction on the inside of your mouth or nose? No Did you need to seek medical attention at a hospital or doctor's office? No When did it last happen?unknown If all above answers are "NO", may proceed with cephalosporin use.     . Sulfur Rash   Patient Measurements: Height: 6\' 2"  (188 cm) Weight: 107 kg (235 lb 14.3 oz) IBW/kg (Calculated) : 82.2 Heparin Dosing Weight: 102 kg  Vital Signs: Temp: 97.4 F (36.3 C) (01/15 2140) Temp Source: Oral (01/15 2140) BP: 115/67 (01/15 2140) Pulse Rate: 98 (01/15 2140)  Labs: Recent Labs    12/13/20 0657 12/14/20 0717 12/15/20 0755  HGB 13.8 13.7 14.1  HCT 41.2 39.9 41.7  PLT 444* 464* 472*  HEPARINUNFRC 0.59 0.72* 1.08*  CREATININE 3.25* 3.48* 3.51*    Estimated Creatinine Clearance: 29.5 mL/min (A) (by C-G formula based on SCr of 3.51 mg/dL (H)).  Assessment: Patient is a 60 y.o M with hx heart transplant, Hodgkin lymphoma and bladder cancer who was recently hospitalized from 12/30 to 1/3 for PNA.  He also underwent cystoscopy with right ureteral stent placement on 11/29/20 for right hydronephrosis and was also found to have new retroperitoneal lymphadenopathy during this admission.  He went to see his urologist on 1/5 with c/o back pain and was sent to [facility] for further workup.  He underwent CT guided right  retroperitoneal lymph node biopsy on 1/6 with retroperitoneal hemorrhage noted on post-biopsy CT. DDimer was >20 on 1/10 with LE doppler positive for acute RLE DVT. Pharmacy is consulted to start heparin drip for acute VTE- no Bolus  1/15 PM update:  -Heparin level is elevated at 1.06  >>>>>>Level is currently not crossing over in Epic, but I confirmed this result with the lab -RN notes that his foley bag has a slightly pink color to it, previous Hgb stable, monitor this closely   Goal of Therapy:  Heparin level 0.3-0.7 units/ml Monitor platelets by anticoagulation protocol: Yes   Plan:   Hold heparin x 1.5 hours, resume at lower rate of 1100 units/hr at 0030 on 1/16 and check 8 hr heparin level   Cont to monitor for s/sx bleeding, check with RN about foley  Daily CBC, heparin level  Narda Bonds, PharmD, BCPS Clinical Pharmacist Phone: [phone]

## 2020-12-15 NOTE — Progress Notes (Signed)
[facility] Progress Note  Subjective: creat 3.5 today, up slightly, UOP 350 yest, feeling "foggy" today, very swollen  Vitals:   12/14/20 0430 12/14/20 1855 12/14/20 2005 12/15/20 0521  BP: 125/88 123/83 124/86 121/82  Pulse: 97 87 85 88  Resp: _0 Temp: 97.6 F (36.4 C) 97.8 F (36.6 C) 97.9 F (36.6 C) 98.1 F (36.7 C)  TempSrc: Oral Oral Oral Oral  SpO2: 93% 92% 94% 94%  Weight:    107 kg  Height:        Exam: Gen alert, not in distress No jvd or bruits Chest clear bilat to bases RRR no MRG Abd protuberant, no ascites by exam, no hsm, +BS Ext 2-3+ pitting bilat LE from feet to hips to abd/chest wall Neuro is alert, Ox 3 , nf     Date              Creat               eGFR   2008- 16        0.9- 1.3               2017              1.1- 2.1   2018- 2020    1.3- 2.31 Aug 2020       1.2- 1.4            55-57   11/29/20        2.49                 32   12/20/2020            1.81                 43   12/08/20            2.07   12/10/20          2.35                 31   Home meds:  - allopurinol 200  - synthroid 125 ug qd  - rapeprazole 40 bid/ florastor bid  - tramadol 50 qid prn/ prednisone 13m qd/  / prn flexeril  - azelastine-fluticasone qd  - prn's/ vitamins/ supplements    UA 12/07/2020 - 5 ket, 30 prot, 11-20 rbc, 11-20 wbc, 0-5 epis     UA 11/29/20  lrg Hb, small LE, neg protein, >50 rbc, 6-10 wbc    UCx no growth,  BCx's - no growth x 2     Abd CT - Urinary Tract: Fullness of the RIGHT renal pelvis with nephroureteral stent in place showing soft tissue density with distension of the renal pelvis and calices similar to the study acquired on the same date. Nephrolithiasis also unchanged. Perinephric nodularity along the medial upper pole the LEFT kidney measures 6.4 x 2.7 cm which is within 1-2 mm of its previous measurement when measured by this observer on the prior study. Increased in size when compared to the study of November 30, 2020.  Renal cortical scarring on the LEFT with nephrolithiasis in the lower pole unchanged from very recent comparison. Urinary bladder with small amount of gas in the urinary bladder with distal aspect of the stent in place, loop coiled in the urinary bladder.  Assessment/ Plan: 1. AKI on CKD 3a - b/l creat 1.2- 1.4 from oct 2021. Renal function worsening over  last few weeks. UA +wbc's and rbc's, no sig proteinuria. Imaging shows stable R ureteral stent and stable R collecting system dilatation.  Has new significant leg edema bilaterally. ECHO showed normal EF. Creat 1.8 on admission > up to 2.3 then 2.8 after lasix tried, then 3.1 >  3.2 > 3.4 today. Lasix IV tried x 1. Suspect lymphatic obstruction d/t malignancy + low albumin causing LE edema. BP's wnl, creat continues to rise slowly and vol overload is quite significant. Pt is not doing well at all. I told him he will need to consider whether he wants to try dialysis or not in the short-term as it seems like we are headed in that direction, vs hospice care as the alternative. He will d/w his wife. In the meantime will retry IV lasix. Will follow.  2. Hyponatremia - Uosm up and UNa normal. Focal LE edema not responsive to lasix.  3. High-grade bladder cancer- recent dx , treated w/ BCG in Aug 2021, sp TURBT x 2 Aug and Sept. New RP adenopathy w/ R hydro in Dec 2021 > cysto and R ureteral stent per urology. Biopsy RP mass done last week showed poorly diff urothelial carcinoma, molecular markers pending. Onc consulting w/ [facility].   4. Edema - new bilat sig leg edema, no CHF. Failed trial of IV lasix. Supportive care.  5. H/o Hodgkin's lymphoma - remote 6. H/o cardiac transplant - taking prograf 1.5 mg /d and pred 5 /d 7. Acute RLE DVT - per primary team 8. Chronic diast CHF - had prior LVEF 10% but now has corrected to 60-65%. F/ [facility] system.        Glenn Gonzalez 12/15/2020, 9:54 AM   Recent Labs  Lab 12/14/20 0717 12/15/20 0755  K 4.9 5.4*  BUN  74* 87*  CREATININE 3.48* 3.51*  CALCIUM 9.1 9.2  HGB 13.7 14.1   Inpatient medications: . allopurinol  200 mg Oral Daily  . artificial tears  1 application Both Eyes QHS  . azelastine  1 spray Each Nare Daily   And  . fluticasone  1 spray Each Nare Daily  . dexamethasone  4 mg Oral BID WC  . docusate sodium  100 mg Oral BID  . fentaNYL  1 patch Transdermal Q72H  . lactulose  10 g Oral BID  . levothyroxine  125 mcg Oral Daily  . loratadine  10 mg Oral Daily  . metoCLOPramide  5 mg Oral TID AC & HS  . polyvinyl alcohol  1 drop Both Eyes TID  . senna-docusate  2 tablet Oral BID  . sucralfate  1 g Oral BID AC & HS  . Tacrolimus ER  1.5 mg Oral Daily  . traZODone  100 mg Oral QHS   . heparin 1,600 Units/hr (12/14/20 2020)   acetaminophen **OR** acetaminophen, bisacodyl, calcium carbonate, HYDROmorphone (DILAUDID) injection, HYDROmorphone, magnesium hydroxide, [START ON 12/17/2020] ondansetron **OR** [START ON 12/17/2020] ondansetron (ZOFRAN) IV, pantoprazole, prochlorperazine

## 2020-12-16 DIAGNOSIS — N1831 Chronic kidney disease, stage 3a: Secondary | ICD-10-CM

## 2020-12-16 DIAGNOSIS — I82401 Acute embolism and thrombosis of unspecified deep veins of right lower extremity: Secondary | ICD-10-CM

## 2020-12-16 DIAGNOSIS — J90 Pleural effusion, not elsewhere classified: Secondary | ICD-10-CM

## 2020-12-16 DIAGNOSIS — I5042 Chronic combined systolic (congestive) and diastolic (congestive) heart failure: Secondary | ICD-10-CM

## 2020-12-16 DIAGNOSIS — K7689 Other specified diseases of liver: Secondary | ICD-10-CM

## 2020-12-16 DIAGNOSIS — N179 Acute kidney failure, unspecified: Secondary | ICD-10-CM | POA: Diagnosis not present

## 2020-12-16 DIAGNOSIS — I313 Pericardial effusion (noninflammatory): Secondary | ICD-10-CM

## 2020-12-16 DIAGNOSIS — C7910 Secondary malignant neoplasm of unspecified urinary organs: Secondary | ICD-10-CM | POA: Diagnosis not present

## 2020-12-16 LAB — CBC
HCT: 44.1 % (ref 39.0–52.0)
Hemoglobin: 14.3 g/dL (ref 13.0–17.0)
MCH: 31.1 pg (ref 26.0–34.0)
MCHC: 32.4 g/dL (ref 30.0–36.0)
MCV: 95.9 fL (ref 80.0–100.0)
Platelets: 472 10*3/uL — ABNORMAL HIGH (ref 150–400)
RBC: 4.6 MIL/uL (ref 4.22–5.81)
RDW: 15.6 % — ABNORMAL HIGH (ref 11.5–15.5)
WBC: 23.5 10*3/uL — ABNORMAL HIGH (ref 4.0–10.5)
nRBC: 0.3 % — ABNORMAL HIGH (ref 0.0–0.2)

## 2020-12-16 LAB — HEPARIN LEVEL (UNFRACTIONATED)
Heparin Unfractionated: 0.87 IU/mL — ABNORMAL HIGH (ref 0.30–0.70)
Heparin Unfractionated: 0.6 IU/mL (ref 0.30–0.70)

## 2020-12-16 LAB — BASIC METABOLIC PANEL
Anion gap: 17 — ABNORMAL HIGH (ref 5–15)
BUN: 105 mg/dL — ABNORMAL HIGH (ref 6–20)
CO2: 20 mmol/L — ABNORMAL LOW (ref 22–32)
Calcium: 8.9 mg/dL (ref 8.9–10.3)
Chloride: 86 mmol/L — ABNORMAL LOW (ref 98–111)
Creatinine, Ser: 4.68 mg/dL — ABNORMAL HIGH (ref 0.61–1.24)
GFR, Estimated: 14 mL/min — ABNORMAL LOW (ref >60)
Glucose, Bld: 138 mg/dL — ABNORMAL HIGH (ref 70–99)
Potassium: 5.9 mmol/L — ABNORMAL HIGH (ref 3.5–5.1)
Sodium: 123 mmol/L — ABNORMAL LOW (ref 135–145)

## 2020-12-16 MED ORDER — FENTANYL 25 MCG/HR TD PT72
1.0000 | MEDICATED_PATCH | TRANSDERMAL | Status: DC
  Administered 2020-12-18 – 2020-12-30 (×5): 1 via TRANSDERMAL
  Filled 2020-12-16 (×5): qty 1

## 2020-12-16 MED ORDER — CHLORHEXIDINE GLUCONATE CLOTH 2 % EX PADS
6.0000 | MEDICATED_PAD | Freq: Every day | CUTANEOUS | Status: DC
  Administered 2020-12-17 – 2020-12-23 (×7): 6 via TOPICAL

## 2020-12-16 MED ORDER — SODIUM ZIRCONIUM CYCLOSILICATE 10 G PO PACK
10.0000 g | PACK | Freq: Two times a day (BID) | ORAL | Status: DC
  Administered 2020-12-16 – 2020-12-18 (×5): 10 g via ORAL
  Filled 2020-12-16 (×7): qty 1

## 2020-12-16 MED ORDER — CHLORHEXIDINE GLUCONATE CLOTH 2 % EX PADS
6.0000 | MEDICATED_PAD | Freq: Every day | CUTANEOUS | Status: DC
  Administered 2020-12-16 – 2020-12-22 (×6): 6 via TOPICAL

## 2020-12-16 NOTE — Progress Notes (Signed)
Patient transferred to [facility] 5M06 via [facility], report called to [employer].

## 2020-12-16 NOTE — Plan of Care (Signed)
  Problem: Nutrition: Goal: Adequate nutrition will be maintained Outcome: Progressing   Problem: Coping: Goal: Level of anxiety will decrease Outcome: Progressing   Problem: Pain Managment: Goal: General experience of comfort will improve Outcome: Progressing   

## 2020-12-16 NOTE — Progress Notes (Signed)
[facility] Progress Note  Subjective: creat up mid 4's , pt tired, no confusion / jerking  Vitals:   12/15/20 1700 12/15/20 2140 12/16/20 0101 12/16/20 0918  BP: 91/78 115/67 91/63 (!) 88/62  Pulse: 97 98 100 96  Resp: '18 16 14 17  ' Temp:  (!) 97.4 F (36.3 C) 97.9 F (36.6 C) 98.2 F (36.8 C)  TempSrc:  Oral Oral Oral  SpO2: (!) 89% 96% 94% 97%  Weight:      Height:        Exam: Gen alert, not in distress No jvd or bruits Chest clear bilat to bases RRR no MRG Abd protuberant, no ascites by exam, no hsm, +BS Ext 2-3+ pitting bilat LE from feet to hips to abd/chest wall Neuro is alert, Ox 3 , nf     Date              Creat               eGFR   2008- 16        0.9- 1.3               2017              1.1- 2.1   2018- 2020    1.3- 2.31 Aug 2020       1.2- 1.4            55-57   11/29/20        2.49                 32   12/24/2020            1.81                 43   12/08/20            2.07   12/10/20          2.35                 31   Home meds:  - allopurinol 200  - synthroid 125 ug qd  - rapeprazole 40 bid/ florastor bid  - tramadol 50 qid prn/ prednisone 70m qd/  / prn flexeril  - azelastine-fluticasone qd  - prn's/ vitamins/ supplements    UA 12/21/2020 - 5 ket, 30 prot, 11-20 rbc, 11-20 wbc, 0-5 epis     UA 11/29/20  lrg Hb, small LE, neg protein, >50 rbc, 6-10 wbc    UCx no growth,  BCx's - no growth x 2     Abd CT - Urinary Tract: Fullness of the RIGHT renal pelvis with nephroureteral stent in place showing soft tissue density with distension of the renal pelvis and calices similar to the study acquired on the same date. Nephrolithiasis also unchanged. Perinephric nodularity along the medial upper pole the LEFT kidney measures 6.4 x 2.7 cm which is within 1-2 mm of its previous measurement when measured by this observer on the prior study. Increased in size when compared to the study of November 30, 2020. Renal cortical scarring on the LEFT with  nephrolithiasis in the lower pole unchanged from very recent comparison. Urinary bladder with small amount of gas in the urinary bladder with distal aspect of the stent in place, loop coiled in the urinary bladder.  Assessment/ Plan: 1. AKI on CKD 3a - b/l creat 1.2- 1.4 from oct 2021. Renal function worsening over last few weeks.  UA +wbc's and rbc's, no sig proteinuria. Imaging shows stable R ureteral stent and stable R collecting system dilatation.  Has new significant leg edema. ECHO showed normal EF. Creat 1.8 on admission > up to 2.3 then 2.8 after lasix tried, then 3.1 >  3.2 > 3.4 > up to 4.5 today. Will need dialysis, consulting IR for HD cath on Monday then HD thereafter. Pt's questions answered.  2. Hyperkalemia - lokelma bid and renal diet  3. Hyponatremia - Uosm up and UNa normal. Focal LE edema not responsive to lasix. Will improve w/ HD 4. High-grade bladder cancer- recent dx , treated w/ BCG in Aug 2021, sp TURBT x 2 Aug and Sept. New RP adenopathy w/ R hydro in Dec 2021 > cysto and R ureteral stent per urology. Biopsy RP mass done last week showed poorly diff urothelial carcinoma, molecular markers pending. Onc consulting w/ [facility].   5. Volume excess - minimize fluid intake 6. H/o Hodgkin's lymphoma - remote 7. H/o cardiac transplant - taking prograf 1.5 mg /d and pred 5 /d 8. Acute RLE DVT - per primary team 9. Chronic diast CHF - had prior LVEF 10% but now has corrected to 60-65%       Rob Teran Daughenbaugh 12/16/2020, 1:03 PM   Recent Labs  Lab 12/15/20 0755 12/16/20 0857  K 5.4* 5.9*  BUN 87* 105*  CREATININE 3.51* 4.68*  CALCIUM 9.2 8.9  HGB 14.1 14.3   Inpatient medications: . allopurinol  200 mg Oral Daily  . artificial tears  1 application Both Eyes QHS  . azelastine  1 spray Each Nare Daily   And  . fluticasone  1 spray Each Nare Daily  . cetirizine  10 mg Oral QHS  . Chlorhexidine Gluconate Cloth  6 each Topical Daily  . dexamethasone  4 mg Oral BID WC  .  fentaNYL  2 patch Transdermal Q72H  . [START ON 12/18/2020] fentaNYL  1 patch Transdermal Q72H  . lactulose  10 g Oral BID  . levothyroxine  125 mcg Oral Daily  . metoCLOPramide  5 mg Oral TID AC & HS  . polyvinyl alcohol  1 drop Both Eyes TID  . senna-docusate  3 tablet Oral BID  . sodium zirconium cyclosilicate  10 g Oral BID  . Tacrolimus ER  1.5 mg Oral Daily  . traZODone  100 mg Oral QHS   . heparin 950 Units/hr (12/16/20 1109)   acetaminophen **OR** acetaminophen, bisacodyl, calcium carbonate, HYDROmorphone (DILAUDID) injection, magnesium hydroxide, [START ON 12/17/2020] ondansetron **OR** [START ON 12/17/2020] ondansetron (ZOFRAN) IV, pantoprazole, prochlorperazine

## 2020-12-16 NOTE — Progress Notes (Signed)
ANTICOAGULATION CONSULT NOTE - Follow Up Consult  Pharmacy Consult for heparin Indication: DVT  Allergies  Allergen Reactions  . Digoxin Other (See Comments)    gynecomastia   . Phencyclidine Other (See Comments)    PCP derived antibiotic > Insomnia  . Spironolactone Other (See Comments)    Gynecomastia  . Oxybutynin Chloride     Other reaction(s): low BP  . Lactose Intolerance (Gi) Diarrhea and Other (See Comments)    cramps  . Penicillins Other (See Comments)    Cramps in stomach Did it involve swelling of the face/tongue/throat, SOB, or low BP? No Did it involve sudden or severe rash/hives, skin peeling, or any reaction on the inside of your mouth or nose? No Did you need to seek medical attention at a hospital or doctor's office? No When did it last happen?unknown If all above answers are "NO", may proceed with cephalosporin use.     . Sulfur Rash    Patient Measurements: Height: 6\' 2"  (188 cm) Weight: 107 kg (235 lb 14.3 oz) IBW/kg (Calculated) : 82.2 Heparin Dosing Weight: 104 kg  Vital Signs: Temp: 98 F (36.7 C) (01/16 2057) Temp Source: Oral (01/16 2057) BP: 105/69 (01/16 2057) Pulse Rate: 91 (01/16 2057)  Labs: Recent Labs    12/14/20 0717 12/15/20 0755 12/16/20 0857 12/16/20 1942  HGB 13.7 14.1 14.3  --   HCT 39.9 41.7 44.1  --   PLT 464* 472* 472*  --   HEPARINUNFRC 0.72* 1.08* 0.87* 0.60  CREATININE 3.48* 3.51* 4.68*  --     Estimated Creatinine Clearance: 22.1 mL/min (A) (by C-G formula based on SCr of 4.68 mg/dL (H)).   Medications:  Scheduled:  . allopurinol  200 mg Oral Daily  . artificial tears  1 application Both Eyes QHS  . azelastine  1 spray Each Nare Daily   And  . fluticasone  1 spray Each Nare Daily  . cetirizine  10 mg Oral QHS  . Chlorhexidine Gluconate Cloth  6 each Topical Daily  . [START ON 12/17/2020] Chlorhexidine Gluconate Cloth  6 each Topical Q0600  . dexamethasone  4 mg Oral BID WC  . fentaNYL  2 patch  Transdermal Q72H  . [START ON 12/18/2020] fentaNYL  1 patch Transdermal Q72H  . lactulose  10 g Oral BID  . levothyroxine  125 mcg Oral Daily  . metoCLOPramide  5 mg Oral TID AC & HS  . polyvinyl alcohol  1 drop Both Eyes TID  . senna-docusate  3 tablet Oral BID  . sodium zirconium cyclosilicate  10 g Oral BID  . Tacrolimus ER  1.5 mg Oral Daily  . traZODone  100 mg Oral QHS   Infusions:  . heparin 950 Units/hr (12/16/20 1629)    Assessment: 60 yo M continues on heparin for acute DVT.  Heparin level 0.6 is at goal after rate change 950 uts/hr   No bleeding noted.  Noted SCr continues to trend up and pt requesting trial of HD.  Goal of Therapy:  Heparin level 0.3-0.7 units/ml Monitor platelets by anticoagulation protocol: Yes   Plan:  Continue  heparin  950 units/hr. Daily CBC  And heparin level    Bonnita Nasuti Pharm.D. CPP, BCPS Clinical Pharmacist [phone] 12/16/2020 9:39 PM     **Pharmacist phone directory can be found on [email or web address] listed under [place].  12/16/2020 9:38 PM

## 2020-12-16 NOTE — Progress Notes (Signed)
ANTICOAGULATION CONSULT NOTE - Follow Up Consult  Pharmacy Consult for heparin Indication: DVT  Allergies  Allergen Reactions  . Digoxin Other (See Comments)    gynecomastia   . Phencyclidine Other (See Comments)    PCP derived antibiotic > Insomnia  . Spironolactone Other (See Comments)    Gynecomastia  . Oxybutynin Chloride     Other reaction(s): low BP  . Lactose Intolerance (Gi) Diarrhea and Other (See Comments)    cramps  . Penicillins Other (See Comments)    Cramps in stomach Did it involve swelling of the face/tongue/throat, SOB, or low BP? No Did it involve sudden or severe rash/hives, skin peeling, or any reaction on the inside of your mouth or nose? No Did you need to seek medical attention at a hospital or doctor's office? No When did it last happen?unknown If all above answers are "NO", may proceed with cephalosporin use.     . Sulfur Rash    Patient Measurements: Height: 6\' 2"  (188 cm) Weight: 107 kg (235 lb 14.3 oz) IBW/kg (Calculated) : 82.2 Heparin Dosing Weight: 104 kg  Vital Signs: Temp: 98.2 F (36.8 C) (01/16 0918) Temp Source: Oral (01/16 0918) BP: 88/62 (01/16 0918) Pulse Rate: 96 (01/16 0918)  Labs: Recent Labs    12/14/20 0717 12/15/20 0755 12/16/20 0857  HGB 13.7 14.1 14.3  HCT 39.9 41.7 44.1  PLT 464* 472* 472*  HEPARINUNFRC 0.72* 1.08* 0.87*  CREATININE 3.48* 3.51* 4.68*    Estimated Creatinine Clearance: 22.1 mL/min (A) (by C-G formula based on SCr of 4.68 mg/dL (H)).   Medications:  Scheduled:  . allopurinol  200 mg Oral Daily  . artificial tears  1 application Both Eyes QHS  . azelastine  1 spray Each Nare Daily   And  . fluticasone  1 spray Each Nare Daily  . cetirizine  10 mg Oral QHS  . Chlorhexidine Gluconate Cloth  6 each Topical Daily  . dexamethasone  4 mg Oral BID WC  . fentaNYL  2 patch Transdermal Q72H  . furosemide  80 mg Intravenous Q8H  . lactulose  10 g Oral BID  . levothyroxine  125 mcg Oral  Daily  . metoCLOPramide  5 mg Oral TID AC & HS  . polyvinyl alcohol  1 drop Both Eyes TID  . senna-docusate  3 tablet Oral BID  . Tacrolimus ER  1.5 mg Oral Daily  . traZODone  100 mg Oral QHS   Infusions:  . heparin 1,100 Units/hr (12/16/20 6503)    Assessment: 60 yo M continues on heparin for acute DVT.  Heparin level is above goal but trending down after rate change overnight.  No bleeding noted.  Noted SCr continues to trend up and pt requesting trial of HD.  Goal of Therapy:  Heparin level 0.3-0.7 units/ml Monitor platelets by anticoagulation protocol: Yes   Plan:  Reduce heparin to 950 units/hr. Heparin level in 8 hours.  [employer], Pharm.D., BCPS Clinical Pharmacist  **Pharmacist phone directory can be found on [email or web address] listed under [place].  12/16/2020 10:18 AM

## 2020-12-16 NOTE — Progress Notes (Signed)
PROGRESS NOTE    Glenn Gonzalez  WCH:[id number] DOB: November 24, 1961 DOA: 12/09/2020 PCP: Lajean Manes, MD    Brief Narrative:  Glenn Gonzalez is a 60 y.o.year old malewith medical history significant for Hodgkin lymphoma, cardiac transplant on tacrolimus and prednisone, chronic combined systolic/diastolic CHF, GERD, high-grade T1 bladder cancer with recent hospitalization from 35/36-1/4 for AKI complicated by hydronephrosis in the setting of new retroperitoneal lymphadenopathy. Patient underwent stent placement by urology and was treated for postoperative multifocal pneumonia.  He presented from urology office with reports of increased back pain not well controlled on oral pain medication as well as generalized fatigue, poor appetite. He was brought in under observation due to concern for back pain in the setting of known retroperitoneal lymphadenopathy.  Hospital course complicated by acute hyponatremia in the setting of poor appetite requiring IV fluids and persistent leukocytosis. CT abdomen imaging consistent with retroperitoneal adenopathy and dilation of right renal pelvis and collecting systems concerning for urothelial neoplasm with extensive metastatic process. Found to have acute right lower extremity DVT on venous duplex started on heparin on 1/10. Has opted for dialysis while awaiting [facility] response.   Assessment & Plan:   Principal Problem:   Metastatic urothelial carcinoma (HCC) Active Problems:   SYSTOLIC HEART FAILURE, CHRONIC   Acute kidney injury superimposed on CKD (HCC)   Hypothyroidism   Hyponatremia   Back pain   Retroperitoneal lymphadenopathy   Liver lesion   Acute on chronic combined systolic and diastolic CHF (congestive heart failure) (HCC)   Acute DVT of right tibial vein (HCC)   Retroperitoneal hemorrhage   Neoplasm related pain   Encounter for antineoplastic chemotherapy   Retroperitoneal adenopathy and perinephric nodularity -Status post IR  guided retroperitoneal biopsy, pathology positive for poorly differentiated carcinoma -Urology following -Oncology following, sent for molecular testing and hoping to discuss with [facility] oncology regarding patient's options -Appreciate palliative medicine assistance with symptom management as well as constipation -Started on palliative chemo 1/14   AKI on CKD stage 3a -Most recent baseline creatinine 1.8 from last hospitalization, baseline creatinine 1.2-1.4 from October 2021 -Suspect lymphatic obstruction due to malignancy, low albumin -Nephrology following--will begin HD next week--discussed with Dr. Jonnie Finner today -Lasix added though it has not been effective  -Potassium is up to 5.9 and all electrolytes are abnormal. Cr is 4.68 -will await Nephrology recs.  Acute right lower extremity DVT -Remains on IV heparin   Small retroperitoneal hemorrhage status post biopsy -Hgb remains stable   Acute on chronic hyponatremia -Initially improved with IV fluid and thought to be due to diminished p.o. intake but now patient is volume overloaded -Nephrology following  Small pericardial effusion -Incidentally noted on echocardiogram, without tamponade physiology  Chronic combined systolic and diastolic heart failure -Patient is status post heart transplant and EF has now corrected to 60 to 65% -IV lasix   History of cardiac transplant -Continue tacrolimus -Change prednisone to dexamethasone per Dr. Cinda Quest recommendation as this may help with patient's energy  -Per patient's request, attempted to get in touch with Dr. Eleanora Neighbor team at [facility] to discuss his cardiac transplant medications.  Despite numerous attempts, could not get through to their answering service.  Hypothyroidism -Continue Synthroid  DVT prophylaxis: ER:[id number] gtt Code Status: DNR  Family Communication: Patient at bedside Disposition Plan: Unknown pending ongoing work-up and medical needs   Consultants:    Nephrology  Oncology  Palliative  IR  Procedures:  IR guided Biopsy 01/06  Antimicrobials: Anti-infectives (From admission, onward)  Start     Dose/Rate Route Frequency Ordered Stop   12/28/2020 2200  cefdinir (OMNICEF) capsule 300 mg        300 mg Oral 2 times daily 12/19/2020 1716 12/10/20 2159        Subjective: Having some difficulty getting his thoughts together this am. Ready to start HD.  Objective: Vitals:   12/15/20 1700 12/15/20 2140 12/16/20 0101 12/16/20 0918  BP: 91/78 115/67 91/63 (!) 88/62  Pulse: 97 98 100 96  Resp: 18 16 14 17   Temp:  (!) 97.4 F (36.3 C) 97.9 F (36.6 C) 98.2 F (36.8 C)  TempSrc:  Oral Oral Oral  SpO2: (!) 89% 96% 94% 97%  Weight:      Height:        Intake/Output Summary (Last 24 hours) at 12/16/2020 0933 Last data filed at 12/16/2020 0803 Gross per 24 hour  Intake 493.96 ml  Output 750 ml  Net -256.04 ml   Filed Weights   12/10/20 1553 12/10/20 1804 12/15/20 0521  Weight: 106.2 kg 109.6 kg 107 kg    Examination:  General exam: Appears calm and comfortable  Respiratory system: Clear to auscultation. Respiratory effort normal. Cardiovascular system: S1 & S2 heard, RRR.  Gastrointestinal system: Abdomen is nondistended, soft and nontender.  Central nervous system: Alert and oriented. No focal neurological deficits. Extremities: Symmetric, marked edema  Skin: No rashes Psychiatry:  Mood & affect appropriate.     Data Reviewed: I have personally reviewed following labs and imaging studies  CBC: Recent Labs  Lab 12/12/20 0616 12/13/20 0657 12/14/20 0717 12/15/20 0755 12/16/20 0857  WBC 21.4* 23.5* 25.0* 19.9* 23.5*  HGB 13.2 13.8 13.7 14.1 14.3  HCT 40.1 41.2 39.9 41.7 44.1  MCV 95.9 94.9 92.4 94.1 95.9  PLT 408* 444* 464* 472* 945*   Basic Metabolic Panel: Recent Labs  Lab 12/11/20 0604 12/12/20 0616 12/13/20 0657 12/14/20 0717 12/15/20 0755  NA 126* 126* 124* 125* 125*  K 4.6 4.3 4.9 4.9 5.4*   CL 93* 91* 90* 91* 90*  CO2 22 21* 20* 22 19*  GLUCOSE 105* 108* 124* 117* 122*  BUN 47* 55* 65* 74* 87*  CREATININE 2.84* 3.17* 3.25* 3.48* 3.51*  CALCIUM 9.6 9.3 9.2 9.1 9.2   GFR: Estimated Creatinine Clearance: 29.5 mL/min (A) (by C-G formula based on SCr of 3.51 mg/dL (H)). Liver Function Tests: Recent Labs  Lab 12/09/20 1334 12/10/20 0449 12/11/20 0604 12/12/20 0616  AST  --  19 19 20   ALT  --  9 10 9   ALKPHOS  --  59 60 60  BILITOT  --  0.5 0.5 0.8  PROT  --  5.2* 5.5* 5.8*  ALBUMIN 3.0* 2.6* 2.5* 2.8*      Radiology Studies: No results found.   Scheduled Meds: . allopurinol  200 mg Oral Daily  . artificial tears  1 application Both Eyes QHS  . azelastine  1 spray Each Nare Daily   And  . fluticasone  1 spray Each Nare Daily  . cetirizine  10 mg Oral QHS  . Chlorhexidine Gluconate Cloth  6 each Topical Daily  . dexamethasone  4 mg Oral BID WC  . fentaNYL  2 patch Transdermal Q72H  . furosemide  80 mg Intravenous Q8H  . lactulose  10 g Oral BID  . levothyroxine  125 mcg Oral Daily  . metoCLOPramide  5 mg Oral TID AC & HS  . polyvinyl alcohol  1 drop Both Eyes TID  .  senna-docusate  3 tablet Oral BID  . Tacrolimus ER  1.5 mg Oral Daily  . traZODone  100 mg Oral QHS   Continuous Infusions: . heparin 1,100 Units/hr (12/16/20 0803)     LOS: 9 days    Donnamae Jude, MD 12/16/2020 9:33 AM [phone] [facility] If 7PM-7AM, please contact night-coverage 12/16/2020, 9:33 AM

## 2020-12-16 NOTE — Progress Notes (Addendum)
Daily Progress Note   Patient Name: Glenn Gonzalez       Date: 12/16/2020 DOB: [birth year]  Age: 60 y.o. MRN#: [id number] Attending Physician: Glenn Jude, MD Primary Care Physician: Glenn Manes, MD Admit Date: 12/30/2020  Reason for Consultation/Follow-up: Establishing goals of care and Pain control  Subjective: MAR reviewed.  He has used 2 doses of short acting medication (8 mg of PO Dilaudid) since yesterday.  I met today with [name], his wife, and his mother.    Discussed renal failure and conversation that he had with Dr. Jonnie Gonzalez regarding decreased kidney function and options for consideration of dialysis vs hospice.  Dr. Sonny Gonzalez called and told [name] that he has spoken with Dr. Irene Gonzalez and remains optimistic that chemotherapy may provide significant benefit and even possible cure of his illness and recommended that he pursue dialysis.  [name] endorsed that he would like to pursue dialysis, at least for a few weeks, to see how he feels on dialysis and how he responds to chemotherapy.  We discussed that, if any point he wants to discontinue dialysis, this is certainly something that we can readdress.  We discussed pain medication and he continues to have incident pain related to sitting up and other movements.  As he is going to be in the hospital longer due to renal failure likely requiring initiation of dialysis, will transition back to IV rescue medication per his desire for quicker breakthrough relief.  He also requested to increase fentanyl patch.   Length of Stay: 9  Current Medications: Scheduled Meds:  . allopurinol  200 mg Oral Daily  . artificial tears  1 application Both Eyes QHS  . azelastine  1 spray Each Nare Daily   And  . fluticasone  1 spray  Each Nare Daily  . cetirizine  10 mg Oral QHS  . Chlorhexidine Gluconate Cloth  6 each Topical Daily  . dexamethasone  4 mg Oral BID WC  . fentaNYL  2 patch Transdermal Q72H  . furosemide  80 mg Intravenous Q8H  . lactulose  10 g Oral BID  . levothyroxine  125 mcg Oral Daily  . metoCLOPramide  5 mg Oral TID AC & HS  . polyvinyl alcohol  1 drop Both Eyes TID  . senna-docusate  3 tablet Oral BID  .  Tacrolimus ER  1.5 mg Oral Daily  . traZODone  100 mg Oral QHS    Continuous Infusions: . heparin 1,100 Units/hr (12/16/20 0803)    PRN Meds: acetaminophen **OR** acetaminophen, bisacodyl, calcium carbonate, HYDROmorphone (DILAUDID) injection, magnesium hydroxide, [START ON 12/17/2020] ondansetron **OR** [START ON 12/17/2020] ondansetron (ZOFRAN) IV, pantoprazole, prochlorperazine  Physical Exam        General: Alert, awake, in no acute distress.  HEENT: No JVD Lungs: Good air movement, no wheezing Abdomen:  Nondistended Ext: + LE edema Skin: Warm and dry Neuro: Grossly intact, nonfocal.   Vital Signs: BP 91/63 (BP Location: Right Arm)   Pulse 100   Temp 97.9 F (36.6 C) (Oral)   Resp 14   Ht 6' 2" (1.88 m)   Wt 107 kg   SpO2 94%   BMI 30.29 kg/m  SpO2: SpO2: 94 % O2 Device: O2 Device: Room Air O2 Flow Rate: O2 Flow Rate (L/min): 1 L/min  Intake/output summary:   Intake/Output Summary (Last 24 hours) at 12/16/2020 0904 Last data filed at 12/16/2020 5188 Gross per 24 hour  Intake 493.96 ml  Output 750 ml  Net -256.04 ml   LBM: Last BM Date: 12/13/20 Baseline Weight: Weight: 102.1 kg Most recent weight: Weight: 107 kg       Palliative Assessment/Data:      Patient Active Problem List   Diagnosis Date Noted  . Encounter for antineoplastic chemotherapy   . Neoplasm related pain   . Retroperitoneal hemorrhage 12/11/2020  . Metastatic urothelial carcinoma ([place])   . Acute on chronic combined systolic and diastolic CHF (congestive heart failure) ([place]) 12/10/2020   . Acute DVT of right tibial vein ([place]) 12/10/2020  . Retroperitoneal lymphadenopathy   . Liver lesion   . Back pain 12/30/2020  . Multifocal pneumonia 11/29/2020  . Tachycardia 11/29/2020  . Hypoxia 11/29/2020  . Hyponatremia 11/29/2020  . Sepsis ([place]) 11/29/2020  . Hydronephrosis 11/29/2020  . Iron deficiency anemia due to chronic blood loss 07/21/2019  . Onychomycosis 11/08/2018  . Sepsis due to gram-negative UTI ([place]) 02/22/2018  . Status post heart transplantation ([place]) 02/22/2018  . Acute kidney injury superimposed on CKD ([place]) 02/22/2018  . Hypothyroidism 02/22/2018  . Atrial fibrillation ([place]) [I48.91] 05/15/2016  . S/P lumbar laminectomy 01/04/2016  . Palpitations 04/11/2015  . Dyspnea 01/02/2015  . Atrial tachycardia ([place]) 09/19/2014  . Fatigue 08/10/2014  . Hypotension 10/23/2013  . Insomnia 08/04/2013  . Chronotropic incompetence 05/31/2013  . Biventricular implantable cardioverter-defibrillator BSx 05/31/2013  . Nonischemic cardiomyopathy ([place]) 10/10/2011  . SHORTNESS OF BREATH 06/20/2010  . VENTRICULAR TACHYCARDIA 06/04/2010  . SYSTOLIC HEART FAILURE, CHRONIC 06/04/2010  . DEPRESSION 03/15/2008  . VERTIGO, BENIGN PAROXYSMAL POSITION 09/23/2007  . NEOPLASM, BENIGN, SKIN, TRUNK 08/26/2007  . VIRAL URI 08/14/2007    Palliative Care Assessment & Plan   Patient Profile: 60 year old male with medical history of cardiac transplant, combined heart failure, GERD, high-grade T1 bladder cancer with AKI complicated by hydronephrosis in setting of new retroperitoneal lymphadenopathy with biopsy showing poorly differentiated carcinoma  Recommendations/Plan:  Pain, cancer related: Increase fentanyl patch.  Will change rescue medication back to IV as he will be inpatient longer and his pain is very quick onset incident pain that is more quickly relieved with IV medication.  A lot of his pain is being driven by incident pain and discussed with him recommendation to  premedicate prior to activities that cause pain.  Constipation, opioid related: Currently on scheduled senna and lactulose.  Dulcolax suppository as  needed.  Goals of Care and Additional Recommendations:  Limitations on Scope of Treatment: Full Scope Treatment- He is invested in plan for trial of dialysis.  I notified Dr. Maylene Roes and Dr. Jonnie Gonzalez and will begin process to transition to [place].  Code Status:    Code Status Orders  (From admission, onward)         Start     Ordered   12/31/2020 1713  Full code  Continuous        12/12/2020 1716        Code Status History    Date Active Date Inactive Code Status Order ID Comments User Context   11/30/2020 0140 12/03/2020 1851 Full Code [id number]  Chotiner, Yevonne Aline, MD Inpatient   02/22/2018 1459 02/24/2018 1704 Full Code [id number]  Karmen Bongo, MD ED   04/20/2015 1145 04/20/2015 1813 Full Code [id number]  Bensimhon, Shaune Pascal, MD Inpatient   Advance Care Planning Activity       Prognosis:   Guarded  Discharge Planning:  To Be Determined  Care plan was discussed with patient, Dr. Maylene Roes  Thank you for allowing the Palliative Medicine Team to assist in the care of this patient.   Time In: 1100 Time Out: 1150 Total Time 50 Prolonged Time Billed No   Greater than 50%  of this time was spent counseling and coordinating care related to the above assessment and plan.  Micheline Rough, MD  Please contact Palliative Medicine Team phone at [phone] for questions and concerns.

## 2020-12-17 ENCOUNTER — Inpatient Hospital Stay (HOSPITAL_COMMUNITY): Payer: 59

## 2020-12-17 DIAGNOSIS — R140 Abdominal distension (gaseous): Secondary | ICD-10-CM

## 2020-12-17 HISTORY — PX: IR FLUORO GUIDE CV LINE LEFT: IMG2282

## 2020-12-17 HISTORY — PX: IR US GUIDE VASC ACCESS LEFT: IMG2389

## 2020-12-17 LAB — CBC
HCT: 43.8 % (ref 39.0–52.0)
Hemoglobin: 14.5 g/dL (ref 13.0–17.0)
MCH: 31.1 pg (ref 26.0–34.0)
MCHC: 33.1 g/dL (ref 30.0–36.0)
MCV: 94 fL (ref 80.0–100.0)
Platelets: 443 10*3/uL — ABNORMAL HIGH (ref 150–400)
RBC: 4.66 MIL/uL (ref 4.22–5.81)
RDW: 15.5 % (ref 11.5–15.5)
WBC: 22.1 10*3/uL — ABNORMAL HIGH (ref 4.0–10.5)
nRBC: 0.2 % (ref 0.0–0.2)

## 2020-12-17 LAB — BASIC METABOLIC PANEL
Anion gap: 17 — ABNORMAL HIGH (ref 5–15)
BUN: 120 mg/dL — ABNORMAL HIGH (ref 6–20)
CO2: 18 mmol/L — ABNORMAL LOW (ref 22–32)
Calcium: 8.6 mg/dL — ABNORMAL LOW (ref 8.9–10.3)
Chloride: 87 mmol/L — ABNORMAL LOW (ref 98–111)
Creatinine, Ser: 5.26 mg/dL — ABNORMAL HIGH (ref 0.61–1.24)
GFR, Estimated: 12 mL/min — ABNORMAL LOW (ref >60)
Glucose, Bld: 122 mg/dL — ABNORMAL HIGH (ref 70–99)
Potassium: 5.9 mmol/L — ABNORMAL HIGH (ref 3.5–5.1)
Sodium: 122 mmol/L — ABNORMAL LOW (ref 135–145)

## 2020-12-17 LAB — HEPATITIS B CORE ANTIBODY, TOTAL: Hep B Core Total Ab: NONREACTIVE

## 2020-12-17 LAB — HEPARIN LEVEL (UNFRACTIONATED)
Heparin Unfractionated: 0.2 IU/mL — ABNORMAL LOW (ref 0.30–0.70)
Heparin Unfractionated: 0.36 IU/mL (ref 0.30–0.70)

## 2020-12-17 LAB — HEPATITIS C ANTIBODY: HCV Ab: NONREACTIVE

## 2020-12-17 LAB — HEPATITIS B SURFACE ANTIGEN: Hepatitis B Surface Ag: NONREACTIVE

## 2020-12-17 MED ORDER — HEPARIN SODIUM (PORCINE) 1000 UNIT/ML IJ SOLN
INTRAMUSCULAR | Status: AC
  Filled 2020-12-17: qty 4

## 2020-12-17 MED ORDER — LIDOCAINE HCL 1 % IJ SOLN
INTRAMUSCULAR | Status: AC | PRN
Start: 2020-12-17 — End: 2020-12-17
  Administered 2020-12-17: 10 mL

## 2020-12-17 MED ORDER — GELATIN ABSORBABLE 12-7 MM EX MISC
CUTANEOUS | Status: AC
  Filled 2020-12-17: qty 1

## 2020-12-17 MED ORDER — MIDAZOLAM HCL 2 MG/2ML IJ SOLN
INTRAMUSCULAR | Status: AC
  Filled 2020-12-17: qty 2

## 2020-12-17 MED ORDER — MIDAZOLAM HCL 2 MG/2ML IJ SOLN
INTRAMUSCULAR | Status: AC | PRN
  Administered 2020-12-17: 0.5 mg via INTRAVENOUS

## 2020-12-17 MED ORDER — LIDOCAINE HCL 1 % IJ SOLN
INTRAMUSCULAR | Status: AC
  Filled 2020-12-17: qty 20

## 2020-12-17 MED ORDER — ALBUMIN HUMAN 25 % IV SOLN
INTRAVENOUS | Status: AC
  Filled 2020-12-17: qty 100

## 2020-12-17 MED ORDER — FENTANYL CITRATE (PF) 100 MCG/2ML IJ SOLN
INTRAMUSCULAR | Status: AC
  Filled 2020-12-17: qty 2

## 2020-12-17 MED ORDER — VANCOMYCIN HCL IN DEXTROSE 1-5 GM/200ML-% IV SOLN
INTRAVENOUS | Status: AC
  Administered 2020-12-17: 1000 mg via INTRAVENOUS
  Filled 2020-12-17: qty 200

## 2020-12-17 MED ORDER — HEPARIN SODIUM (PORCINE) 1000 UNIT/ML IJ SOLN
INTRAMUSCULAR | Status: AC
  Filled 2020-12-17: qty 1

## 2020-12-17 MED ORDER — HEPARIN SODIUM (PORCINE) 1000 UNIT/ML IJ SOLN
INTRAMUSCULAR | Status: AC | PRN
  Administered 2020-12-17: 2000 [IU] via INTRAVENOUS

## 2020-12-17 MED ORDER — VANCOMYCIN HCL IN DEXTROSE 1-5 GM/200ML-% IV SOLN
1000.0000 mg | INTRAVENOUS | Status: AC
  Filled 2020-12-17: qty 200

## 2020-12-17 NOTE — Progress Notes (Addendum)
[facility] Progress Note   UA 12/17/2020 - 5 ket, 30 prot, 11-20 rbc, 11-20 wbc, 0-5 epis UA 11/29/20 lrg Hb, small LE, neg protein, >50 rbc, 6-10 wbc UCx no growth, BCx's - no growth x 2   Assessment/ Plan:   1. AKI on CKD 3a - b/l creat 1.2- 1.4 from oct 2021. Renal function worsening over last few weeks. UA +wbc's and rbc's, no sig proteinuria. Imaging shows stable R ureteral stent and stable R collecting system dilatation. Has new significant leg edema. ECHO showed normal EF. Creat 1.8 on admission > up to 2.3 then 2.8 after lasix tried, then 3.1 >  3.2 > 3.4 > up to 4.5 today. 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 But CIN does not explain the acute worsening of renal function.  Will need dialysis today and tomorrow; appreciate VIR placing tunneled catheter today.  - Hematuria present as well; may be from the ureteral stent which was recently placed. He has retroperitoneal LAD as well has h/o high grade bladder cancer. - Will check serologies and UPC but AKI more likely to be from the malignancy + diuresis. 2. Hyperkalemia - lokelma bid and renal diet but definitive therapy today with RRT. 3. Hyponatremia - Uosm up and UNa normal. Focal LE edema not responsive to lasix. Will improve w/ HD 4. High-grade bladder cancer- recent dx , treated w/ BCG in Aug 2021, sp TURBT x 2 Aug and Sept. New RP adenopathy w/ R hydro in Dec 2021 > cysto and R ureteral stent per urology. Biopsy RP mass done last week showed poorly diff urothelial carcinoma, molecular markers pending. Onc consulting w/ [facility].   5. Volume excess - minimize fluid intake 6. H/o Hodgkin's lymphoma - remote 7. H/o cardiac transplant - taking prograf 1.5 mg /d and pred 5 /d 8. Acute RLE DVT - per primary team. Positive for superficial venous thrombosis/thrombophlebitis of the mid right leg greater saphenous vein but neg for DVT. 9. Chronic  diast CHF - had prior LVEF 10% but now has corrected to 60-65%   Subjective:   Denies f/c/n/v/dyspnea. No events overnight   Objective:   BP 105/74 (BP Location: Right Arm)   Pulse 85   Temp 97.9 F (36.6 C) (Oral)   Resp 16   Ht 6\' 2"  (1.88 m)   Wt 103.6 kg   SpO2 92%   BMI 29.32 kg/m   Intake/Output Summary (Last 24 hours) at 12/17/2020 0750 Last data filed at 12/17/2020 0600 Gross per 24 hour  Intake 593.46 ml  Output 575 ml  Net 18.46 ml   Weight change:   Physical Exam: Genalert, not in distress No jvd or bruits Chest clear bilatto bases RRR no MRG Abdprotuberant, no ascites by exam, no hsm, +BS Ext2-3+ pitting bilat LE from feet to hips to abd/chest wall Neuro is alert, Ox 3 , nf  Imaging: No results found.  Labs: BMET Recent Labs  Lab 12/11/20 0604 12/12/20 0616 12/13/20 0657 12/14/20 0717 12/15/20 0755 12/16/20 0857 12/17/20 0319  NA 126* 126* 124* 125* 125* 123* 122*  K 4.6 4.3 4.9 4.9 5.4* 5.9* 5.9*  CL 93* 91* 90* 91* 90* 86* 87*  CO2 22 21* 20* 22 19* 20* 18*  GLUCOSE 105* 108* 124* 117* 122* 138* 122*  BUN 47* 55* 65* 74* 87* 105* 120*  CREATININE 2.84* 3.17* 3.25* 3.48* 3.51* 4.68* 5.26*  CALCIUM 9.6 9.3 9.2 9.1  9.2 8.9 8.6*   CBC Recent Labs  Lab 12/14/20 0717 12/15/20 0755 12/16/20 0857 12/17/20 0319  WBC 25.0* 19.9* 23.5* 22.1*  HGB 13.7 14.1 14.3 14.5  HCT 39.9 41.7 44.1 43.8  MCV 92.4 94.1 95.9 94.0  PLT 464* 472* 472* 443*    Medications:    . allopurinol  200 mg Oral Daily  . artificial tears  1 application Both Eyes QHS  . azelastine  1 spray Each Nare Daily   And  . fluticasone  1 spray Each Nare Daily  . cetirizine  10 mg Oral QHS  . Chlorhexidine Gluconate Cloth  6 each Topical Daily  . Chlorhexidine Gluconate Cloth  6 each Topical Q0600  . dexamethasone  4 mg Oral BID WC  . fentaNYL  2 patch Transdermal Q72H  . [START ON 12/18/2020] fentaNYL  1 patch Transdermal Q72H  . lactulose  10 g Oral BID  .  levothyroxine  125 mcg Oral Daily  . metoCLOPramide  5 mg Oral TID AC & HS  . polyvinyl alcohol  1 drop Both Eyes TID  . senna-docusate  3 tablet Oral BID  . sodium zirconium cyclosilicate  10 g Oral BID  . Tacrolimus ER  1.5 mg Oral Daily  . traZODone  100 mg Oral QHS      Otelia Santee, MD 12/17/2020, 7:50 AM

## 2020-12-17 NOTE — Consult Note (Signed)
Chief Complaint: Patient was seen in consultation today for tunneled dialysis catheter placement at the request of Glenn Gonzalez  Referring Physician(s): Glenn Gonzalez  Supervising Physician: Glenn Gonzalez  Patient Status: [facility] - In-pt  History of Present Illness: Glenn Gonzalez is a 60 y.o. male   AKI/CHD Renal function worsening few weeks Significant LE edema Bladder Cancer 07/2020: Rt renal nephroureteral stent Acute RLE DVT-- on heparin drip  To initiate dialysis per nephrology Scheduled now for tunneled dialysis catheter placement in IR    Past Medical History:  Diagnosis Date  . A-fib ([place])    IN PAST with old heart   . Bladder tumor   . Bulging lumbar disc    states " i threw my back out on tuesday 6-16, i ahve a bulging disc, but i can lie on my back a side "   . Chronic fatigue   . Chronic kidney disease    CREATININE NOW 1.5 stage 1 ckd  . Colon cancer ([place]) DX 2019   SURGERY DONE  . Complication of anesthesia    AFTER HEART TRANSPLANT TOOK 2 YEARS TO GET BACK TO SELF; patient does not want aneshesia for endoscopy procedures   . Complication of anesthesia    likes propofol wants as few of anesthesia drugs as possible  . Depression    SITUATIONAL  . GERD (gastroesophageal reflux disease)   . Gout yrs ago  . History of chemotherapy 1982 for hodgkins   DAMAGED HEART MUSCLE WITH RADIATION ALSO  . History of kidney stones   . History of radiation therapy 1982   for hodgkins lymphoma  . Hodgkin's lymphoma ([place]) 1980   IIIB. 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  . Hypothyroidism   . Pneumonia FROM CHF 2016   recurrent pneumonia sedf.  rad tx for lymphoma  . Skin abnormality    right chest small area with squamous cell area removed 3 weeks ago as of 07-27-2020  . Skin cancer    AREAS REMOVED FROM [facility], CHECK AND BACK AND HEAD SEPT 2019  . Status post heart transplantation ([place]) 2017   caused by chemo drug adriomycin     Past Surgical History:  Procedure Laterality Date  . BIOPSY  07/02/2018   Procedure: BIOPSY;  Surgeon: Carol Ada, MD;  [facility]: [facility];  Service: Endoscopy;;  . BIOPSY  05/20/2019   Procedure: BIOPSY;  Surgeon: Carol Ada, MD;  [facility]: [facility];  Service: Endoscopy;;  . CARDIAC CATHETERIZATION N/A 04/20/2015   Procedure: Right/Left Heart Cath and Coronary Angiography;  Surgeon: Jolaine Artist, MD;  Location: [place] [facility];  Service: Cardiovascular;  Laterality: N/A;  . CARDIAC CATHETERIZATION  10-11-15   pt states routine heart cath to be done at [facility]   . CARDIAC CATHETERIZATION N/A 04/02/2016   Procedure: Right Heart Cath;  Surgeon: Jolaine Artist, MD;  Location: [place] CV [facility];  Service: Cardiovascular;  Laterality: N/A;  . CARDIAC CATHETERIZATION  JULY 2019 AT [facility]  . COLONOSCOPY N/A 07/02/2018   Procedure: COLONOSCOPY;  Surgeon: Carol Ada, MD;  [facility];  Service: Endoscopy;  Laterality: N/A;  . COLONOSCOPY N/A 08/05/2019   Procedure: COLONOSCOPY;  Surgeon: Carol Ada, MD;  [facility];  Service: Endoscopy;  Laterality: N/A;  . COLONOSCOPY WITH PROPOFOL N/A 08/03/2015   Procedure: COLONOSCOPY WITH PROPOFOL;  Surgeon: Carol Ada, MD;  [facility];  Service: Endoscopy;  Laterality: N/A;  wants to try to do procedure without  sedation  . COLONOSCOPY WITH PROPOFOL N/A 08/10/2020   Procedure: COLONOSCOPY WITH PROPOFOL;  Surgeon: Carol Ada, MD;  [facility];  Service: Endoscopy;  Laterality: N/A;  . CYSTOSCOPY N/A 08/28/2020   Procedure: CYSTOSCOPY;  Surgeon: Remi Haggard, MD;  [facility];  Service: Urology;  Laterality: N/A;  . CYSTOSCOPY W/ RETROGRADES Bilateral 07/31/2020   Procedure: CYSTOSCOPY WITH RETROGRADE PYELOGRAM;  Surgeon: Remi Haggard, MD;  Location: [facility];  Service: Urology;  Laterality: Bilateral;  . CYSTOSCOPY W/ URETERAL STENT PLACEMENT Right 11/29/2020    Procedure: CYSTOSCOPY WITH RETROGRADE PYELOGRAM/URETERAL STENT PLACEMENT;  Surgeon: Festus Aloe, MD;  [facility];  Service: Urology;  Laterality: Right;  . CYSTOSCOPY WITH INSERTION OF UROLIFT N/A 10/18/2018   Procedure: CYSTOSCOPY WITH INSERTION OF UROLIFT;  Surgeon: Cleon Gustin, MD;  Location: [facility];  Service: Urology;  Laterality: N/A;  30 MINS  . CYSTOSCOPY WITH RETROGRADE PYELOGRAM, URETEROSCOPY AND STENT PLACEMENT Right 10/12/2015   Procedure: CYSTOSCOPY WITH RETROGRADE PYELOGRAM, URETEROSCOPY AND STENT PLACEMENT;  Surgeon: Cleon Gustin, MD;  [facility];  Service: Urology;  Laterality: Right;  . ESOPHAGOGASTRODUODENOSCOPY    . ESOPHAGOGASTRODUODENOSCOPY (EGD) WITH PROPOFOL N/A 09/29/2019   Procedure: ESOPHAGOGASTRODUODENOSCOPY (EGD) WITH PROPOFOL;  Surgeon: Carol Ada, MD;  [facility];  Service: Endoscopy;  Laterality: N/A;  . ESOPHAGOGASTRODUODENOSCOPY (EGD) WITH PROPOFOL N/A 08/10/2020   Procedure: ESOPHAGOGASTRODUODENOSCOPY (EGD) WITH PROPOFOL;  Surgeon: Carol Ada, MD;  [facility];  Service: Endoscopy;  Laterality: N/A;  . EXPLORATORY LAPAROTOMY  1982   staging laparotomy  . FLEXIBLE SIGMOIDOSCOPY N/A 05/20/2019   Procedure: FLEXIBLE SIGMOIDOSCOPY;  Surgeon: Carol Ada, MD;  [facility];  Service: Endoscopy;  Laterality: N/A;  . FLEXIBLE SIGMOIDOSCOPY N/A 04/20/2020   Procedure: FLEXIBLE SIGMOIDOSCOPY;  Surgeon: Carol Ada, MD;  [facility];  Service: Endoscopy;  Laterality: N/A;  . gynemastia Bilateral   . HEART TRANSPLANT  06/20/2016   at [facility]  . HOT HEMOSTASIS N/A 07/02/2018   Procedure: HOT HEMOSTASIS (ARGON PLASMA COAGULATION/BICAP);  Surgeon: Carol Ada, MD;  Location: [facility];  Service: Endoscopy;  Laterality: N/A;  . LUMBAR LAMINECTOMY/DECOMPRESSION MICRODISCECTOMY Right 01/04/2016   Procedure: Microdiscectomy Lumbar four Lumbar five right;  Surgeon: Eustace Moore, MD;  Location: [place] [facility];  Service: Neurosurgery;  Laterality: Right;  . neck biopses     x5  . 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 POLYPECTOMY  07/02/2018   Procedure: POLYPECTOMY;  Surgeon: Carol Ada, MD;  [facility];  Service: Endoscopy;;  . POLYPECTOMY  08/05/2019   Procedure: POLYPECTOMY;  Surgeon: Carol Ada, MD;  [facility];  Service: Endoscopy;;  . POLYPECTOMY  04/20/2020   Procedure: POLYPECTOMY;  Surgeon: Carol Ada, MD;  [facility];  Service: Endoscopy;;  . POLYPECTOMY  08/10/2020   Procedure: POLYPECTOMY;  Surgeon: Carol Ada, MD;  [facility];  Service: Endoscopy;;  . PORTACATH PLACEMENT     06-29-15 inserted, now removed.  . SPLENECTOMY, TOTAL  1981  . STONE EXTRACTION WITH BASKET Right 10/12/2015   Procedure: STONE EXTRACTION WITH BASKET;  Surgeon: Cleon Gustin, MD;  [facility];  Service: Urology;  Laterality: Right;  . SUBMUCOSAL INJECTION  07/02/2018   Procedure: SUBMUCOSAL INJECTION;  Surgeon: Carol Ada, MD;  [facility];  Service: Endoscopy;;  . TONSILLECTOMY  as child  . TRANSURETHRAL RESECTION OF BLADDER TUMOR N/A 07/31/2020  Procedure: TRANSURETHRAL RESECTION OF BLADDER TUMOR (TURBT) FULGERATION;  Surgeon: Remi Haggard, MD;  [facility];  Service: Urology;  Laterality: N/A;  . TRANSURETHRAL RESECTION OF BLADDER TUMOR N/A 08/28/2020   Procedure: TRANSURETHRAL RESECTION OF BLADDER TUMOR (TURBT);  Surgeon: Remi Haggard, MD;  [facility];  Service: Urology;  Laterality: N/A;  30 MINS  . VARICELE REPAIR  40 YRS AGO    Allergies: Digoxin, Phencyclidine, Spironolactone, Oxybutynin chloride, Lactose intolerance (gi), Penicillins, and Sulfur  Medications: Prior to Admission medications   Medication Sig Start Date End Date Taking? Authorizing Provider  acetaminophen (TYLENOL) 650 MG CR tablet Take  1,300 mg by mouth daily as needed for pain.   Yes [provider]  allopurinol (ZYLOPRIM) 100 MG tablet Take 200 mg by mouth daily.  01/04/18  Yes [provider]  Azelastine-Fluticasone 137-50 MCG/ACT SUSP Place 1 spray into both nostrils daily. 11/27/20  Yes [provider]  calcium carbonate (TUMS) 500 MG chewable tablet Chew 2,000 mg by mouth daily as needed for indigestion or heartburn.   Yes [provider]  cetirizine (ZYRTEC) 10 MG tablet Take 10 mg by mouth at bedtime.    Yes [provider]  cyclobenzaprine (FLEXERIL) 10 MG tablet Take 10 mg by mouth 3 (three) times daily as needed for muscle spasms.   Yes [provider]  ENVARSUS XR 0.75 MG TB24 Take 1.5 mg by mouth daily. 08/07/20  Yes [provider]  levothyroxine (SYNTHROID, LEVOTHROID) 125 MCG tablet Take 125 mcg by mouth daily.  01/09/16  Yes [provider]  NON FORMULARY Apply 1 application topically daily. [facility] Anti-fungal (Nail)  (3) Refills *added the urea 40% to the anti-fungal (nail) -#1   Yes [provider]  predniSONE (DELTASONE) 2.5 MG tablet Take 1 tablet (2.5 mg total) by mouth daily. Patient taking differently: Take 5 mg by mouth daily. 02/25/18  Yes Regalado, Belkys A, MD  RABEprazole (ACIPHEX) 20 MG tablet Take 2 tablets (40 mg total) by mouth 2 (two) times daily before a meal. 12/03/20 03/03/21 Yes Dahal, Marlowe Aschoff, MD  dexamethasone (DECADRON) 4 MG tablet Take 2 tablets (8 mg total) by mouth daily. Start the day after chemotherapy for 2 days. 12/14/20   Glenn Genera, MD  LORazepam (ATIVAN) 0.5 MG tablet Take 1 tablet (0.5 mg total) by mouth every 6 (six) hours as needed (Nausea or vomiting). 12/14/20   Glenn Genera, MD  ondansetron (ZOFRAN) 8 MG tablet Take 1 tablet (8 mg total) by mouth 2 (two) times daily as needed for refractory nausea / vomiting. Start on day 3 after chemo. 12/14/20   Glenn Genera, MD   prochlorperazine (COMPAZINE) 10 MG tablet Take 1 tablet (10 mg total) by mouth every 6 (six) hours as needed (Nausea or vomiting). 12/14/20   Glenn Genera, MD  trimethoprim (TRIMPEX) 100 MG tablet TAKE 1 TABLET BY MOUTH EVERY DAY Patient not taking: No sig reported 10/11/20   Cleon Gustin, MD  trimethoprim (TRIMPEX) 100 MG tablet TAKE 1 TABLET BY MOUTH EVERY DAY Patient not taking: No sig reported 10/11/20   Cleon Gustin, MD     Family History  Problem Relation Age of Onset  . Hyperthyroidism Mother   . Insomnia Mother   . Hypertension Father   . Depression Father   . Insomnia Father     Social History   Socioeconomic History  . Marital status: Married    Spouse name: Not  on file  . Number of children: Not on file  . Years of education: Not on file  . Highest education level: Not on file  Occupational History  . Occupation: disabled  Tobacco Use  . Smoking status: Never Smoker  . Smokeless tobacco: Never Used  Vaping Use  . Vaping Use: Never used  Substance and Sexual Activity  . Alcohol use: No  . Drug use: No  . Sexual activity: Not on file  Other Topics Concern  . Not on file  Social History Narrative  . Not on file   Social Determinants of Health   Financial Resource Strain: Not on file  Food Insecurity: Not on file  Transportation Needs: Not on file  Physical Activity: Not on file  Stress: Not on file  Social Connections: Not on file     Review of Systems: A 12 point ROS discussed and pertinent positives are indicated in the HPI above.  All other systems are negative.  Review of Systems  Constitutional: Positive for activity change, appetite change and fatigue.  Respiratory: Positive for shortness of breath. Negative for cough.   Cardiovascular: Positive for leg swelling. Negative for chest pain.  Gastrointestinal: Positive for nausea.  Musculoskeletal: Positive for gait problem.  Neurological: Positive for weakness.   Psychiatric/Behavioral: Negative for behavioral problems and confusion.    Vital Signs: BP 105/74 (BP Location: Right Arm)   Pulse 85   Temp 97.9 F (36.6 C) (Oral)   Resp 16   Ht 6\' 2"  (1.88 m)   Wt 228 lb 6.3 oz (103.6 kg)   SpO2 92%   BMI 29.32 kg/m   Physical Exam Vitals reviewed.  Cardiovascular:     Rate and Rhythm: Normal rate and regular rhythm.     Heart sounds: Normal heart sounds.  Pulmonary:     Breath sounds: Wheezing present.  Abdominal:     Palpations: Abdomen is soft.  Musculoskeletal:        General: Swelling present. Normal range of motion.     Right lower leg: Edema present.     Left lower leg: Edema present.  Skin:    General: Skin is warm.  Neurological:     Mental Status: He is oriented to person, place, and time.  Psychiatric:        Behavior: Behavior normal.     Imaging: CT ABDOMEN PELVIS WO CONTRAST  Result Date: 12/26/2020 CLINICAL DATA:  Cancer of unknown primary in this 60 year old male. EXAM: CT CHEST, ABDOMEN AND PELVIS WITHOUT CONTRAST TECHNIQUE: Multidetector CT imaging of the chest, abdomen and pelvis was performed following the standard protocol without IV contrast. COMPARISON:  CT abdomen and pelvis of the same date. Also with CT of December 30th of 2021 FINDINGS: CT CHEST FINDINGS Cardiovascular: Calcified atheromatous plaque in the thoracic aorta. Heart size is normal following sternotomy for heart transplant by report a [facility] medical center. Three-vessel coronary artery calcification. No pericardial effusion. Calcifications in the LEFT atrium partially seen on previous imaging are of uncertain significance central pulmonary vasculature also with some signs of calcification. Mediastinum/Nodes: Ovoid structure associated with LEFT thyroid 11 mm similar to previous imaging, cervical spine from 2017. Thyroid as described above. Calcifications in the LEFT supraclavicular region may be within venous structures on the LEFT. No  axillary lymphadenopathy. Surgical clips in the RIGHT axilla and soft tissue thickening over the LEFT pectoralis musculature similar grossly compared to remote MRI evaluation 2018 and surgical clips seen on numerous priors in the RIGHT axilla.  No mediastinal lymphadenopathy. No gross hilar lymphadenopathy. Lungs/Pleura: Small RIGHT of pleural effusion and basilar airspace disease. Small nodule in the superior aspect of the RIGHT middle lobe approximately 3 mm on image 57 of series 5. Biapical scarring. Small nodule in the LEFT upper lobe 6 mm (image 51, series 5) airways are patent. Musculoskeletal: Evidence ir sternotomy without significant bony bridging but with sclerotic margins along the sternotomy line similar to studies dating back to August of 2021 with respect to visualized portions on prior abdomen studies no destructive bone finding or acute bone process. CT ABDOMEN PELVIS FINDINGS Hepatobiliary: 1.9 cm lesion in the RIGHT hepatic lobe near the dome of the RIGHT hemi liver does not display attenuation values of simple cyst is unchanged from the study acquired on the same date at another facility. Additionally a 1.4 cm lesion in the RIGHT hepatic lobe on image 23 of series 2 is unchanged. Sludge in the gallbladder. Tiny low-density foci in the LEFT hepatic lobe without change 1 on image 19 of series 2 in the lateral segment measuring 5 mm and another in the medial segment of the LEFT hepatic lobe measuring 12 mm on image 21 of series 2 these areas appears similar dating back to August of 2020, lesions in the RIGHT hepatic lobe are new. Pancreas: Normal pancreatic contour without signs of inflammation. Spleen: Post splenectomy. Adrenals/Urinary Tract: Adrenal glands, normal on the LEFT and obscured on the RIGHT by perinephric and Peri adrenal nodularity. Fullness of the RIGHT renal pelvis with nephroureteral stent in place showing soft tissue density with distension of the renal pelvis and calices similar to  the study acquired on the same date. Nephrolithiasis also unchanged. Perinephric nodularity along the medial upper pole the LEFT kidney measures 6.4 x 2.7 cm which is within 1-2 mm of its previous measurement when measured by this observer on the prior study. Increased in size when compared to the study of November 30, 2020. Renal cortical scarring on the LEFT with nephrolithiasis in the lower pole unchanged from very recent comparison. Urinary bladder with small amount of gas in the urinary bladder with distal aspect of the stent in place, loop coiled in the urinary bladder. Stomach/Bowel: Scattered colonic diverticulosis without acute gastrointestinal process. Normal appendix. Stool and contrast throughout the colon. Vascular/Lymphatic: Calcified atheromatous plaque in the abdominal aorta without aneurysmal dilation. Retroperitoneal adenopathy unchanged from scan earlier the same day, largest lymph nodes behind the inferior vena cava and in the intra-aortocaval groove and in the upper abdomen, largest on image 41 of series 2 measuring 1.9 cm. Smaller lymph nodes along the LEFT periaortic chain. Postoperative changes in the retroperitoneum associated with previous lymphadenectomy No pelvic lymphadenopathy. Reproductive: Prostate with uro lift device deployment bilaterally similar to recent priors. Other: Extensive retroperitoneal fascial thickening/stranding extending into the root of the small bowel mesentery, nodular changes associated with this thickening on the RIGHT outlining the renal fascia this crosses the midline where it is without significant nodularity but with fascial thickening and stranding, also tracking into the pelvis. Small amount of fluid in the pelvis. Musculoskeletal: Spinal degenerative changes. No destructive bone process or acute bone finding. IMPRESSION: 1. Retroperitoneal adenopathy and perinephric nodularity with dilation of the RIGHT renal pelvis and collecting systems, filled with what  is suspected to represent neoplasm. 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 Differential considerations given heart transplantation and presumed immunosuppression would include posttransplant lymphoproliferative disorder/lymphoma. 2. Dense material in  collecting systems may represent a mixture of tumor and or blood and there is profound dilation of collecting systems of the RIGHT kidney. This is unchanged compared to the recent comparison study. 3. Signs of hepatic involvement with new lesions in the RIGHT hepatic lobe. 4. Ascites with small amount of ascites with very subtle infiltration of fat in the omentum not mentioned above, for instance on image 50 of series 2 raising the question of peritoneal involvement as well. This area is in the range of 2-3 mm. 5. Small RIGHT pleural effusion and basilar airspace disease. 6. Postoperative changes about the chest likely related to prior heart transplantation with surgical clips in the RIGHT axilla and thickening and calcification along the anterior pectoralis musculature not changed since 2018. 7. Calcification in the LEFT atrium also about the aortic root and pulmonary artery and to a lesser degree in the RIGHT atrium likely reflects changes of vascular anastomotic sites in the setting of heart transplantation. 8. 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 9. Post splenectomy. 10. Aortic atherosclerosis. 11. Small nodule in the LEFT upper lobe 6 mm airways are patent. These results will be called to the ordering clinician or representative by the Radiologist Assistant, and communication documented in the PACS or [employer]. Aortic Atherosclerosis (ICD10-I70.0). Electronically Signed   By: Zetta Bills M.D.   On: 12/30/2020 17:38   DG Chest 1 View  Result Date: 11/29/2020 CLINICAL  DATA:  Hypoxia EXAM: CHEST  1 VIEW COMPARISON:  08/03/2020 FINDINGS: Since the prior examination, there has developed right basilar consolidation, possibly infectious in the appropriate clinical setting. Mild focal infiltrate is also noted within the right apex. Left lung is clear. No pneumothorax or pleural effusion. Median sternotomy has been performed. Cardiac size within normal limits. Multiple healed right rib fractures are noted. Multiple surgical clips are seen within the right axilla. IMPRESSION: Interval development of multifocal pulmonary infiltrate within the right lung, more focal within the right lung base, suspicious for atypical infection in the acute setting. Electronically Signed   By: Fidela Salisbury MD   On: 11/29/2020 22:27   DG Chest 2 View  Result Date: 12/25/2020 CLINICAL DATA:  Bladder cancer.  Follow-up multifocal pneumonia. EXAM: CHEST - 2 VIEW COMPARISON:  11/29/2020 FINDINGS: Prior median sternotomy. Heart is normal size. Patchy right lung airspace disease in the right apex and right lower lung. Minimal left base linear atelectasis or scarring. Overall aeration in the right lung slightly improved. No effusions. IMPRESSION: Patchy right lung airspace disease with slight improvement since prior study. Left base atelectasis or scarring. Electronically Signed   By: Rolm Baptise M.D.   On: 12/27/2020 13:25   CT CHEST WO CONTRAST  Result Date: 12/21/2020 CLINICAL DATA:  Cancer of unknown primary in this 60 year old male. EXAM: CT CHEST, ABDOMEN AND PELVIS WITHOUT CONTRAST TECHNIQUE: Multidetector CT imaging of the chest, abdomen and pelvis was performed following the standard protocol without IV contrast. COMPARISON:  CT abdomen and pelvis of the same date. Also with CT of December 30th of 2021 FINDINGS: CT CHEST FINDINGS Cardiovascular: Calcified atheromatous plaque in the thoracic aorta. Heart size is normal following sternotomy for heart transplant by report a [facility] medical  center. Three-vessel coronary artery calcification. No pericardial effusion. Calcifications in the LEFT atrium partially seen on previous imaging are of uncertain significance central pulmonary vasculature also with some signs of calcification. Mediastinum/Nodes: Ovoid structure associated with LEFT thyroid 11  mm similar to previous imaging, cervical spine from 2017. Thyroid as described above. Calcifications in the LEFT supraclavicular region may be within venous structures on the LEFT. No axillary lymphadenopathy. Surgical clips in the RIGHT axilla and soft tissue thickening over the LEFT pectoralis musculature similar grossly compared to remote MRI evaluation 2018 and surgical clips seen on numerous priors in the RIGHT axilla. No mediastinal lymphadenopathy. No gross hilar lymphadenopathy. Lungs/Pleura: Small RIGHT of pleural effusion and basilar airspace disease. Small nodule in the superior aspect of the RIGHT middle lobe approximately 3 mm on image 57 of series 5. Biapical scarring. Small nodule in the LEFT upper lobe 6 mm (image 51, series 5) airways are patent. Musculoskeletal: Evidence ir sternotomy without significant bony bridging but with sclerotic margins along the sternotomy line similar to studies dating back to August of 2021 with respect to visualized portions on prior abdomen studies no destructive bone finding or acute bone process. CT ABDOMEN PELVIS FINDINGS Hepatobiliary: 1.9 cm lesion in the RIGHT hepatic lobe near the dome of the RIGHT hemi liver does not display attenuation values of simple cyst is unchanged from the study acquired on the same date at another facility. Additionally a 1.4 cm lesion in the RIGHT hepatic lobe on image 23 of series 2 is unchanged. Sludge in the gallbladder. Tiny low-density foci in the LEFT hepatic lobe without change 1 on image 19 of series 2 in the lateral segment measuring 5 mm and another in the medial segment of the LEFT hepatic lobe measuring 12 mm on image  21 of series 2 these areas appears similar dating back to August of 2020, lesions in the RIGHT hepatic lobe are new. Pancreas: Normal pancreatic contour without signs of inflammation. Spleen: Post splenectomy. Adrenals/Urinary Tract: Adrenal glands, normal on the LEFT and obscured on the RIGHT by perinephric and Peri adrenal nodularity. Fullness of the RIGHT renal pelvis with nephroureteral stent in place showing soft tissue density with distension of the renal pelvis and calices similar to the study acquired on the same date. Nephrolithiasis also unchanged. Perinephric nodularity along the medial upper pole the LEFT kidney measures 6.4 x 2.7 cm which is within 1-2 mm of its previous measurement when measured by this observer on the prior study. Increased in size when compared to the study of November 30, 2020. Renal cortical scarring on the LEFT with nephrolithiasis in the lower pole unchanged from very recent comparison. Urinary bladder with small amount of gas in the urinary bladder with distal aspect of the stent in place, loop coiled in the urinary bladder. Stomach/Bowel: Scattered colonic diverticulosis without acute gastrointestinal process. Normal appendix. Stool and contrast throughout the colon. Vascular/Lymphatic: Calcified atheromatous plaque in the abdominal aorta without aneurysmal dilation. Retroperitoneal adenopathy unchanged from scan earlier the same day, largest lymph nodes behind the inferior vena cava and in the intra-aortocaval groove and in the upper abdomen, largest on image 41 of series 2 measuring 1.9 cm. Smaller lymph nodes along the LEFT periaortic chain. Postoperative changes in the retroperitoneum associated with previous lymphadenectomy No pelvic lymphadenopathy. Reproductive: Prostate with uro lift device deployment bilaterally similar to recent priors. Other: Extensive retroperitoneal fascial thickening/stranding extending into the root of the small bowel mesentery, nodular changes  associated with this thickening on the RIGHT outlining the renal fascia this crosses the midline where it is without significant nodularity but with fascial thickening and stranding, also tracking into the pelvis. Small amount of fluid in the pelvis. Musculoskeletal: Spinal degenerative changes. No destructive bone process  or acute bone finding. IMPRESSION: 1. Retroperitoneal adenopathy and perinephric nodularity with dilation of the RIGHT renal pelvis and collecting systems, filled with what is suspected to represent neoplasm. 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 Differential considerations given heart transplantation and presumed immunosuppression would include posttransplant lymphoproliferative disorder/lymphoma. 2. Dense material in collecting systems may represent a mixture of tumor and or blood and there is profound dilation of collecting systems of the RIGHT kidney. This is unchanged compared to the recent comparison study. 3. Signs of hepatic involvement with new lesions in the RIGHT hepatic lobe. 4. Ascites with small amount of ascites with very subtle infiltration of fat in the omentum not mentioned above, for instance on image 50 of series 2 raising the question of peritoneal involvement as well. This area is in the range of 2-3 mm. 5. Small RIGHT pleural effusion and basilar airspace disease. 6. Postoperative changes about the chest likely related to prior heart transplantation with surgical clips in the RIGHT axilla and thickening and calcification along the anterior pectoralis musculature not changed since 2018. 7. Calcification in the LEFT atrium also about the aortic root and pulmonary artery and to a lesser degree in the RIGHT atrium likely reflects changes of vascular anastomotic sites in the setting of heart transplantation. 8. 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 9. Post splenectomy. 10. Aortic atherosclerosis. 11. Small nodule in the LEFT upper lobe 6 mm airways are patent. These results will be called to the ordering clinician or representative by the Radiologist Assistant, and communication documented in the PACS or [employer]. Aortic Atherosclerosis (ICD10-I70.0). Electronically Signed   By: Zetta Bills M.D.   On: 12/19/2020 17:38   NM Pulmonary Perfusion  Result Date: 12/30/2020 CLINICAL DATA:  Respiratory failure EXAM: NUCLEAR MEDICINE PERFUSION LUNG SCAN TECHNIQUE: Perfusion images were obtained in multiple projections after intravenous injection of radiopharmaceutical. Ventilation scans intentionally deferred if perfusion scan and chest x-ray adequate for interpretation during COVID 19 epidemic. RADIOPHARMACEUTICALS:  4.1 mCi Tc-60m MAA IV COMPARISON:  Chest x-ray 12/13/2020 FINDINGS: Slightly heterogeneous distribution of radiotracer within the lung fields. Multiple small bilateral wedge-shaped perfusion defects. No corresponding radiographic abnormality. No large mismatched segmental perfusion defect. IMPRESSION: Intermediate probability for pulmonary embolism. Electronically Signed   By: Davina Poke D.O.   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 ANESTHESIA/SEDATION: Fentanyl 100 mcg IV; Versed 4 mg IV Sedation time: 14 minutes; The patient was continuously monitored during the procedure by the interventional radiology nurse under my direct supervision. CONTRAST:  None. COMPLICATIONS: SIR Level A - No therapy, no consequence. Retroperitoneal hemorrhage at site of biopsy, controlled with Gel-Foam slurry administration along needle track. PROCEDURE: Informed consent was  obtained from the patient following an explanation of the procedure, risks, benefits and alternatives. A time out was performed prior to the initiation of the procedure. The patient was positioned in a partial left lateral decubitus position on the CT table and a limited CT was performed for procedural planning demonstrating similar appearing multifocal right retroperitoneal lymphadenopathy. The procedure was planned. The operative site was prepped and draped in the usual sterile fashion. Appropriate trajectory was confirmed with a 22 gauge spinal needle after the adjacent tissues were anesthetized  with 1% Lidocaine with epinephrine. Under intermittent CT guidance, a 17 gauge coaxial needle was advanced into the peripheral aspect of the mass. Appropriate positioning was confirmed and a total of 4 samples were obtained with an 18 gauge core needle biopsy device. A limited CT demonstrated focal hemorrhage about the biopsied lymph node. Gel-Foam slurry was injected through the introducer needle along the needle track. The co-axial needle was removed and hemostasis was achieved with manual compression. Additional limited postprocedural CT was negative for expanding hemorrhage or additional complication. A dressing was placed. The patient tolerated the procedure well without immediate postprocedural complication. IMPRESSION: Technically successful CT guided core needle biopsy of right retroperitoneal lymph node. Ruthann Cancer, MD [facility] Radiology Electronically Signed   By: Ruthann Cancer MD   On: 12/07/2020 08:53   DG CHEST PORT 1 VIEW  Result Date: 12/10/2020 CLINICAL DATA:  Hypoxia.  History of cardiac transplant EXAM: PORTABLE CHEST 1 VIEW COMPARISON:  12/06/2020 FINDINGS: Prior median sternotomy. Heart size within normal limits. Atherosclerotic calcification of the aortic knob. Subtle right lower lobe opacity, slightly improved from prior. Trace right pleural  effusion. No pneumothorax. IMPRESSION: 1. Subtle right lower lobe opacity, slightly improved from prior. 2. Trace right pleural effusion. Electronically Signed   By: Davina Poke D.O.   On: 12/10/2020 10:33   DG C-Arm 1-60 Min-No Report  Result Date: 11/29/2020 Fluoroscopy was utilized by the requesting physician.  No radiographic interpretation.   ECHOCARDIOGRAM COMPLETE  Result Date: 11/30/2020    ECHOCARDIOGRAM REPORT   Patient Name:   Glenn Gonzalez Date of Exam: 11/30/2020 Medical Rec #:  [id number]         Height:       74.0 in Accession #:    [id number]        Weight:       221.0 lb Date of Birth:  [birth year]        BSA:          2.268 m Patient Age:    85 years          BP:           103/59 mmHg Patient Gender: M                 HR:           94 bpm. Exam Location:  Inpatient Procedure: 2D Echo, Cardiac Doppler, Color Doppler and Intracardiac            Opacification Agent Indications:    Dyspnea R06.00  History:        Patient has prior history of Echocardiogram examinations, most                 recent 05/12/2017. Arrythmias:Atrial Fibrillation; Risk                 Factors:Non-Smoker. GERD. 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  Sonographer:    Vickie Epley RDCS Referring Phys: [id number] [facility]  Sonographer Comments: Suboptimal apical window and suboptimal subcostal window. Pt unable to turn on side due to herniated discs. IMPRESSIONS  1. Left ventricular ejection fraction, by estimation, is 60 to 65%. The left ventricle has normal function. The left ventricle has no regional wall motion abnormalities. Left ventricular diastolic parameters were normal.  2. Right ventricular systolic function is normal. The right ventricular size is normal. There is normal pulmonary artery systolic pressure. The estimated right ventricular systolic pressure is 93.8 mmHg.  3. The mitral  valve is normal in structure. No evidence of mitral valve regurgitation. No evidence of mitral stenosis.  4. The aortic  valve is normal in structure. Aortic valve regurgitation is not visualized. No aortic stenosis is present.  5. The inferior vena cava is normal in size with greater than 50% respiratory variability, suggesting right atrial pressure of 3 mmHg. FINDINGS  Left Ventricle: Left ventricular ejection fraction, by estimation, is 60 to 65%. The left ventricle has normal function. The left ventricle has no regional wall motion abnormalities. Definity contrast agent was given IV to delineate the left ventricular  endocardial borders. The left ventricular internal cavity size was normal in size. There is no left ventricular hypertrophy. Left ventricular diastolic parameters were normal. Normal left ventricular filling pressure. Right Ventricle: The right ventricular size is normal. No increase in right ventricular wall thickness. Right ventricular systolic function is normal. There is normal pulmonary artery systolic pressure. The tricuspid regurgitant velocity is 2.39 m/s, and  with an assumed right atrial pressure of 3 mmHg, the estimated right ventricular systolic pressure is 50.3 mmHg. Left Atrium: Left atrial size was normal in size. Right Atrium: Right atrial size was normal in size. Pericardium: There is no evidence of pericardial effusion. Mitral Valve: The mitral valve is normal in structure. No evidence of mitral valve regurgitation. No evidence of mitral valve stenosis. Tricuspid Valve: The tricuspid valve is normal in structure. Tricuspid valve regurgitation is mild . No evidence of tricuspid stenosis. Aortic Valve: The aortic valve is normal in structure. Aortic valve regurgitation is not visualized. No aortic stenosis is present. Pulmonic Valve: The pulmonic valve was normal in structure. Pulmonic valve regurgitation is not visualized. No evidence of pulmonic stenosis. Aorta: The aortic root is normal in size and structure. Venous: The inferior vena cava is normal in size with greater than 50% respiratory  variability, suggesting right atrial pressure of 3 mmHg. IAS/Shunts: No atrial level shunt detected by color flow Doppler.  LEFT VENTRICLE PLAX 2D LVIDd:         4.40 cm     Diastology LVIDs:         3.20 cm     LV e' medial:    10.00 cm/s LV PW:         0.90 cm     LV E/e' medial:  7.5 LV IVS:        0.90 cm     LV e' lateral:   11.30 cm/s LVOT diam:     2.50 cm     LV E/e' lateral: 6.7 LV SV:         48 LV SV Index:   21 LVOT Area:     4.91 cm  LV Volumes (MOD) LV vol d, MOD A4C: 80.2 ml LV vol s, MOD A4C: 30.4 ml LV SV MOD A4C:     80.2 ml LEFT ATRIUM           Index LA diam:      2.70 cm 1.19 cm/m LA Vol (A4C): 16.3 ml 7.19 ml/m  AORTIC VALVE LVOT Vmax:   50.20 cm/s LVOT Vmean:  36.700 cm/s LVOT VTI:    0.099 m  AORTA Ao Root diam: 3.40 cm MITRAL VALVE               TRICUSPID VALVE MV Area (PHT): 5.38 cm    TR Peak grad:   22.8 mmHg MV Decel Time: 141 msec    TR Vmax:  239.00 cm/s MV E velocity: 75.40 cm/s MV A velocity: 36.00 cm/s  SHUNTS MV E/A ratio:  2.09        Systemic VTI:  0.10 m                            Systemic Diam: 2.50 cm Dani Gobble Croitoru MD Electronically signed by Sanda Klein MD Signature Date/Time: 11/30/2020/12:30:40 PM    Final    VAS [place] LOWER EXTREMITY VENOUS (DVT)  Result Date: 12/10/2020  Lower Venous DVT Study Indications: Pain.  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 Comparison Study: Previous 12/02/20 Negative Performing Technologist: Vonzell Schlatter RVT  Examination Guidelines: A complete evaluation includes B-mode imaging, spectral Doppler, color Doppler, and power Doppler as needed of all accessible portions of each vessel. Bilateral testing is considered an integral part of a complete examination. Limited examinations for reoccurring indications may be performed as noted. The reflux portion of the exam is performed with the patient in reverse Trendelenburg.   +---------+---------------+---------+-----------+----------+--------------+ RIGHT    CompressibilityPhasicitySpontaneityPropertiesThrombus Aging +---------+---------------+---------+-----------+----------+--------------+ CFV      Full           Yes      Yes                                 +---------+---------------+---------+-----------+----------+--------------+ SFJ      Full                                                        +---------+---------------+---------+-----------+----------+--------------+ FV Prox  Full                                                        +---------+---------------+---------+-----------+----------+--------------+ FV Mid   Full                                                        +---------+---------------+---------+-----------+----------+--------------+ FV DistalFull                                                        +---------+---------------+---------+-----------+----------+--------------+ PFV      Full                                                        +---------+---------------+---------+-----------+----------+--------------+ POP      Full           Yes      Yes                                 +---------+---------------+---------+-----------+----------+--------------+  PTV      None                                                        +---------+---------------+---------+-----------+----------+--------------+ PERO     None                                                        +---------+---------------+---------+-----------+----------+--------------+   +---------+---------------+---------+-----------+----------+--------------+ LEFT     CompressibilityPhasicitySpontaneityPropertiesThrombus Aging +---------+---------------+---------+-----------+----------+--------------+ CFV      Full           Yes      Yes                                  +---------+---------------+---------+-----------+----------+--------------+ SFJ      Full                                                        +---------+---------------+---------+-----------+----------+--------------+ FV Prox  Full                                                        +---------+---------------+---------+-----------+----------+--------------+ FV Mid   Full                                                        +---------+---------------+---------+-----------+----------+--------------+ FV DistalFull                                                        +---------+---------------+---------+-----------+----------+--------------+ PFV      Full                                                        +---------+---------------+---------+-----------+----------+--------------+ POP      Full           Yes      Yes                                 +---------+---------------+---------+-----------+----------+--------------+ PTV      Full                                                        +---------+---------------+---------+-----------+----------+--------------+  PERO     Full                                                        +---------+---------------+---------+-----------+----------+--------------+     Summary: RIGHT: - Findings consistent with acute deep vein thrombosis involving one right posterior tibial vein, and both right peroneal veins. - No cystic structure found in the popliteal fossa.  LEFT: - There is no evidence of deep vein thrombosis in the lower extremity.  - No cystic structure found in the popliteal fossa.  *See table(s) above for measurements and observations. Electronically signed by Jamelle Haring on 12/10/2020 at 7:16:00 PM.    Final    VAS [place] LOWER EXTREMITY VENOUS (DVT)  Result Date: 12/03/2020  Lower Venous DVT Study Indications: Pain.  Comparison Study: Previous 12/2019 Performing Technologist: Vonzell Schlatter RVT   Examination Guidelines: A complete evaluation includes B-mode imaging, spectral Doppler, color Doppler, and power Doppler as needed of all accessible portions of each vessel. Bilateral testing is considered an integral part of a complete examination. Limited examinations for reoccurring indications may be performed as noted. The reflux portion of the exam is performed with the patient in reverse Trendelenburg.  +---------+---------------+---------+-----------+----------+--------------+ RIGHT    CompressibilityPhasicitySpontaneityPropertiesThrombus Aging +---------+---------------+---------+-----------+----------+--------------+ CFV      Full           Yes      Yes                                 +---------+---------------+---------+-----------+----------+--------------+ SFJ      Full                                                        +---------+---------------+---------+-----------+----------+--------------+ FV Prox  Full                                                        +---------+---------------+---------+-----------+----------+--------------+ FV Mid   Full                                                        +---------+---------------+---------+-----------+----------+--------------+ FV DistalFull                                                        +---------+---------------+---------+-----------+----------+--------------+ PFV      Full                                                        +---------+---------------+---------+-----------+----------+--------------+  POP      Full           Yes      Yes                                 +---------+---------------+---------+-----------+----------+--------------+ PTV      Full                                                        +---------+---------------+---------+-----------+----------+--------------+ PERO     Full                                                         +---------+---------------+---------+-----------+----------+--------------+   +---------+---------------+---------+-----------+----------+--------------+ LEFT     CompressibilityPhasicitySpontaneityPropertiesThrombus Aging +---------+---------------+---------+-----------+----------+--------------+ CFV      Full           Yes      Yes                                 +---------+---------------+---------+-----------+----------+--------------+ SFJ      Full                                                        +---------+---------------+---------+-----------+----------+--------------+ FV Prox  Full                                                        +---------+---------------+---------+-----------+----------+--------------+ FV Mid   Full                                                        +---------+---------------+---------+-----------+----------+--------------+ FV DistalFull                                                        +---------+---------------+---------+-----------+----------+--------------+ PFV      Full                                                        +---------+---------------+---------+-----------+----------+--------------+ POP      Full           Yes      Yes                                 +---------+---------------+---------+-----------+----------+--------------+  PTV      Full                                                        +---------+---------------+---------+-----------+----------+--------------+ PERO     Full                                                        +---------+---------------+---------+-----------+----------+--------------+     Summary: RIGHT: - There is no evidence of deep vein thrombosis in the lower extremity.  - No cystic structure found in the popliteal fossa.  LEFT: - There is no evidence of deep vein thrombosis in the lower extremity.  - No cystic structure found in the popliteal fossa.   *See table(s) above for measurements and observations. Electronically signed by Ruta Hinds MD on 12/03/2020 at 12:15:41 PM.    Final    ECHOCARDIOGRAM LIMITED  Result Date: 12/10/2020    ECHOCARDIOGRAM LIMITED REPORT   Patient Name:   Glenn Gonzalez Date of Exam: 12/10/2020 Medical Rec #:  [id number]         Height:       74.0 in Accession #:    [id number]        Weight:       230.4 lb Date of Birth:  1961/05/18        BSA:          2.308 m Patient Age:    67 years          BP:           105/72 mmHg Patient Gender: M                 HR:           99 bpm. Exam Location:  Inpatient Procedure: Limited Echo, Cardiac Doppler and Color Doppler Indications:    Acute DVT  History:        Patient has prior history of Echocardiogram examinations, most                 recent 11/30/2020. Arrythmias:Atrial Fibrillation. CKD. GERD.                 S/P heart transplant 01/22/16.  Sonographer:    Clayton Lefort RDCS (AE) Referring Phys: [id number] [facility] NETTE[facility]  Sonographer Comments: Technically difficult study due to poor echo windows, suboptimal apical window and suboptimal subcostal window. Unable to roll patient due to herniated discs. IMPRESSIONS  1. S/P Heart Transplant. Left ventricular ejection fraction, by estimation, is 60 to 65%. The left ventricle has normal function. Left ventricular endocardial border not optimally defined to evaluate regional wall motion. There is moderate concentric left ventricular hypertrophy. Left ventricular diastolic function could not be evaluated.  2. Right ventricular systolic function is moderately reduced. The right ventricular size is mildly enlarged. Tricuspid regurgitation signal is inadequate for assessing PA pressure.  3. A small pericardial effusion is present. The pericardial effusion is surrounding the apex.  4. Tricuspid valve regurgitation is mild to moderate. FINDINGS  Left Ventricle: S/P Heart Transplant. Left ventricular ejection fraction, by estimation, is 60 to 65%. The  left ventricle has normal function.  Left ventricular endocardial border not optimally defined to evaluate regional wall motion. There is moderate concentric left ventricular hypertrophy. Left ventricular diastolic function could not be evaluated. Right Ventricle: The right ventricular size is mildly enlarged. Right ventricular systolic function is moderately reduced. Tricuspid regurgitation signal is inadequate for assessing PA pressure. Pericardium: A small pericardial effusion is present. The pericardial effusion is surrounding the apex. Tricuspid Valve: Tricuspid valve regurgitation is mild to moderate. Venous: The inferior vena cava was not well visualized. LEFT VENTRICLE PLAX 2D LVIDd:         3.60 cm LVIDs:         2.30 cm LV PW:         1.40 cm LV IVS:        1.50 cm LVOT diam:     2.50 cm LVOT Area:     4.91 cm  LEFT ATRIUM         Index LA diam:    2.80 cm 1.21 cm/m   AORTA Ao Root diam: 3.70 cm Ao Asc diam:  3.10 cm TRICUSPID VALVE TR Peak grad:   52.4 mmHg TR Vmax:        362.00 cm/s  SHUNTS Systemic Diam: 2.50 cm Fransico Him MD Electronically signed by Fransico Him MD Signature Date/Time: 12/10/2020/3:35:06 PM    Final     Labs:  CBC: Recent Labs    12/14/20 0717 12/15/20 0755 12/16/20 0857 12/17/20 0319  WBC 25.0* 19.9* 23.5* 22.1*  HGB 13.7 14.1 14.3 14.5  HCT 39.9 41.7 44.1 43.8  PLT 464* 472* 472* 443*    COAGS: Recent Labs    11/30/20 0031  INR 1.0  APTT 22*    BMP: Recent Labs    07/27/20 0653 08/20/20 0934 08/28/20 0700 08/31/20 1126 09/06/20 0712 12/14/20 0717 12/15/20 0755 12/16/20 0857 12/17/20 0319  NA 137 137 140 139   < > 125* 125* 123* 122*  K 4.1 4.1 3.6 3.8   < > 4.9 5.4* 5.9* 5.9*  CL 100 104 103 105   < > 91* 90* 86* 87*  CO2 27 27 25 26    < > 22 19* 20* 18*  GLUCOSE 93 91 97 93   < > 117* 122* 138* 122*  BUN 19 20 20 15    < > 74* 87* 105* 120*  CALCIUM 8.9 9.4 9.3 9.1   < > 9.1 9.2 8.9 8.6*  CREATININE 1.52* 1.52* 1.45* 1.36*   < > 3.48*  3.51* 4.68* 5.26*  GFRNONAA 50* 50* 53* 57*   < > 19* 19* 14* 12*  GFRAA 58* 58* >60 >60  --   --   --   --   --    < > = values in this interval not displayed.    LIVER FUNCTION TESTS: Recent Labs    12/06/20 0519 12/09/20 1334 12/10/20 0449 12/11/20 0604 12/12/20 0616  BILITOT 0.9  --  0.5 0.5 0.8  AST 21  --  19 19 20   ALT 11  --  9 10 9   ALKPHOS 54  --  59 60 60  PROT 5.9*  --  5.2* 5.5* 5.8*  ALBUMIN 3.0* 3.0* 2.6* 2.5* 2.8*    TUMOR MARKERS: No results for input(s): AFPTM, CEA, CA199, CHROMGRNA in the last 8760 hours.  Assessment and Plan:  AKI/CKD Worsening renal function To initiate dialysis per Nephrology Scheduled for HD tunneled cath placement Risks and benefits discussed with the patient including, but not limited to bleeding, infection, vascular  injury, pneumothorax which may require chest tube placement, air embolism or even death  All of the patient's questions were answered, patient is agreeable to proceed. Consent signed and in chart.  Thank you for this interesting consult.  I greatly enjoyed meeting Glenn Gonzalez and look forward to participating in their care.  A copy of this report was sent to the requesting provider on this date.  Electronically Signed: Lavonia Drafts, PA-C 12/17/2020, 7:45 AM   I spent a total of 20 Minutes    in face to face in clinical consultation, greater than 50% of which was counseling/coordinating care for HD tunneled cath placement

## 2020-12-17 NOTE — Progress Notes (Signed)
ANTICOAGULATION CONSULT NOTE - Follow Up Consult  Pharmacy Consult for Heparin Indication: DVT  Allergies  Allergen Reactions  . Digoxin Other (See Comments)    gynecomastia   . Phencyclidine Other (See Comments)    PCP derived antibiotic > Insomnia  . Spironolactone Other (See Comments)    Gynecomastia  . Oxybutynin Chloride     Other reaction(s): low BP  . Elemental Sulfur Rash  . Lactose Intolerance (Gi) Diarrhea and Other (See Comments)    cramps  . Penicillins Other (See Comments)    Cramps in stomach Did it involve swelling of the face/tongue/throat, SOB, or low BP? No Did it involve sudden or severe rash/hives, skin peeling, or any reaction on the inside of your mouth or nose? No Did you need to seek medical attention at a hospital or doctor's office? No When did it last happen?unknown If all above answers are "NO", may proceed with cephalosporin use.       Patient Measurements: Height: 6\' 2"  (188 cm) Weight: 102.9 kg (226 lb 13.7 oz) IBW/kg (Calculated) : 82.2 Heparin Dosing Weight:  102.9 kg  Vital Signs: Temp: 98 F (36.7 C) (01/17 1454) Temp Source: Oral (01/17 1454) BP: 112/80 (01/17 1454) Pulse Rate: 88 (01/17 1454)  Labs: Recent Labs    12/15/20 0755 12/15/20 2042 12/16/20 0857 12/16/20 1942 12/17/20 0319 12/17/20 1017 12/17/20 1800  HGB 14.1  --  14.3  --  14.5  --   --   HCT 41.7  --  44.1  --  43.8  --   --   PLT 472*  --  472*  --  443*  --   --   HEPARINUNFRC 1.08*   < > 0.87* 0.60  --  0.20* 0.36  CREATININE 3.51*  --  4.68*  --  5.26*  --   --    < > = values in this interval not displayed.    Estimated Creatinine Clearance: 19.4 mL/min (A) (by C-G formula based on SCr of 5.26 mg/dL (H)).  Assessment: Anticoag: heparin for acute DVT. Hep level 0.36 now in goal. - 1/6:  IR guided  right retroperitoneal lymph node biopsy. Retroperitoneal hemorrhage noted on post-biopsy CT -1/10 LE doppler- acute deep vein thrombosis   Goal  of Therapy:  Hep level 0.3-0.7 Monitor platelets by anticoagulation protocol: Yes   Plan:  Con't Heparin at 950 units/hr  Daily HL, CBC   Marisol Giambra S. Alford Highland, PharmD, BCPS Clinical Staff Pharmacist [email or web address] Alford Highland, Demetrus Pavao Stillinger 12/17/2020,7:00 PM

## 2020-12-17 NOTE — Procedures (Addendum)
Interventional Radiology Procedure Note  Procedure: Placement of a right IJ approach tunneled HD catheter.  Tip is positioned at the superior cavoatrial junction and catheter is ready for immediate use.  Complications: None Recommendations:  - Ok to use - Do not submerge - Routine line care  - ok to restart heparin ggt now  Signed,  Dulcy Fanny. Earleen Newport, DO

## 2020-12-17 NOTE — Progress Notes (Signed)
ANTICOAGULATION CONSULT NOTE - Follow Up Consult  Pharmacy Consult for heparin Indication: DVT  Allergies  Allergen Reactions  . Digoxin Other (See Comments)    gynecomastia   . Phencyclidine Other (See Comments)    PCP derived antibiotic > Insomnia  . Spironolactone Other (See Comments)    Gynecomastia  . Oxybutynin Chloride     Other reaction(s): low BP  . Elemental Sulfur Rash  . Lactose Intolerance (Gi) Diarrhea and Other (See Comments)    cramps  . Penicillins Other (See Comments)    Cramps in stomach Did it involve swelling of the face/tongue/throat, SOB, or low BP? No Did it involve sudden or severe rash/hives, skin peeling, or any reaction on the inside of your mouth or nose? No Did you need to seek medical attention at a hospital or doctor's office? No When did it last happen?unknown If all above answers are "NO", may proceed with cephalosporin use.       Patient Measurements: Height: 6\' 2"  (188 cm) Weight: 102.9 kg (226 lb 13.7 oz) IBW/kg (Calculated) : 82.2 Heparin Dosing Weight: 104 kg  Vital Signs: Temp: 97.8 F (36.6 C) (01/17 1240) Temp Source: Oral (01/17 1240) BP: 93/70 (01/17 1235) Pulse Rate: 84 (01/17 0930)  Labs: Recent Labs    12/15/20 0755 12/15/20 2042 12/16/20 0857 12/16/20 1942 12/17/20 0319 12/17/20 1017  HGB 14.1  --  14.3  --  14.5  --   HCT 41.7  --  44.1  --  43.8  --   PLT 472*  --  472*  --  443*  --   HEPARINUNFRC 1.08*   < > 0.87* 0.60  --  0.20*  CREATININE 3.51*  --  4.68*  --  5.26*  --    < > = values in this interval not displayed.    Estimated Creatinine Clearance: 19.4 mL/min (A) (by C-G formula based on SCr of 5.26 mg/dL (H)).   Medications:  Scheduled:  . allopurinol  200 mg Oral Daily  . artificial tears  1 application Both Eyes QHS  . azelastine  1 spray Each Nare Daily   And  . fluticasone  1 spray Each Nare Daily  . cetirizine  10 mg Oral QHS  . Chlorhexidine Gluconate Cloth  6 each Topical  Daily  . Chlorhexidine Gluconate Cloth  6 each Topical Q0600  . dexamethasone  4 mg Oral BID WC  . fentaNYL  2 patch Transdermal Q72H  . [START ON 12/18/2020] fentaNYL  1 patch Transdermal Q72H  . gelatin adsorbable      . heparin sodium (porcine)      . heparin sodium (porcine)      . lactulose  10 g Oral BID  . levothyroxine  125 mcg Oral Daily  . lidocaine      . lidocaine      . metoCLOPramide  5 mg Oral TID AC & HS  . polyvinyl alcohol  1 drop Both Eyes TID  . senna-docusate  3 tablet Oral BID  . sodium zirconium cyclosilicate  10 g Oral BID  . Tacrolimus ER  1.5 mg Oral Daily  . traZODone  100 mg Oral QHS   Infusions:  . albumin human    . heparin 950 Units/hr (12/17/20 3154)    Assessment: 60 yo M continues on heparin for acute DVT.   Heparin level was therapeutic at 0.6 on heparin 950 units/hr last evening. No bleeding noted. Have been unable to get an accurate level this morning  due to heparin being held for catheter placement. Heparin was restarted ~ 0930.   Of note: Patient to receive HD today and tomorrow.  Goal of Therapy:  Heparin level 0.3-0.7 units/ml Monitor platelets by anticoagulation protocol: Yes   Plan:  Continue heparin 950 units/hr. Check anti-Xa level 8 hours after heparin restarted and daily while on heparin Continue to monitor H&H and platelets    Thank you for allowing [place] to participate in this patients care.   Jens Som, PharmD Please see amion for complete clinical pharmacist phone list. 12/17/2020 1:51 PM

## 2020-12-17 NOTE — Progress Notes (Signed)
[facility]                                                                              Patient Demographics  Glenn Gonzalez, is a 60 y.o. male, DOB - 14-Mar-1961, IZT:[id number]  Admit date - 12/06/2020   Admitting Physician Desiree Hane, MD  Outpatient Primary MD for the patient is Lajean Manes, MD  Outpatient specialists:   LOS - 10  days   Medical records reviewed and are as summarized below:    No chief complaint on file.      Brief summary  Glenn Gonzalez Duehringis a 60 y.o.year old malewith medical history significant for Hodgkin lymphoma, cardiac transplant on tacrolimus and prednisone, chronic combined systolic/diastolic CHF, GERD, high-grade T1 bladder cancer with recent hospitalization from 38/25-0/5 for AKI complicated by hydronephrosis in the setting of new retroperitoneal lymphadenopathy. Patient underwent stent placement by urology and was treated for postoperative multifocal pneumonia.  He presented from urology office with reports of increased back pain not well controlled on oral pain medication as well as generalized fatigue, poor appetite. He was brought in under observation due to concern for back pain in the setting of known retroperitoneal lymphadenopathy.  Hospital course complicated by acute hyponatremia in the setting of poor appetite requiring IV fluids and persistent leukocytosis. CT abdomen imaging consistent with retroperitoneal adenopathy and dilation of right renal pelvis and collecting systems concerning for urothelial neoplasm with extensive metastatic process. Found to have acute right lower extremity DVT on venous duplex started on heparin on 1/10. Has opted for dialysis while awaiting [facility] response.  1/17: Tunneled dialysis cath placed by IR, pending HD  Assessment & Plan    Principal Problem: Retroperitoneal adenopathy and perinephric nodularity -Status post IR guided retroperitoneal biopsy, pathology positive for  poorly differentiated carcinoma -Urology following -Oncology consulted, sent for molecular testing and hoping to discuss with [facility] oncology regarding patient's options --Startedon palliative chemo 1/14, palliative medicine following  AKI on CKD stage 3a -Baseline creatinine 1.2-1.4 from 08/2020, 1.8 from last hospitalization -Suspect lymphatic obstruction due to malignancy, low albumin, renal function worsening over the last few weeks -Nephrology following, Lasix was added though not effective. - 1/17: IR consulted and tunneled HD cath placed, plan for HD today  Hyperkalemia - Placed on Lokelma twice daily, will improve with HD  Acute right lower extremity DVT -Remains on IV heparin, H&H stable  Small retroperitoneal hemorrhage status post biopsy -H&H currently stable  Acute on chronic hyponatremia -Initially improved with IV fluid and thought to be due to diminished p.o. intake but now patient is volume overloaded -Nephrology following, plan for HD today  Small pericardial effusion -Incidentally noted on echocardiogram, without tamponade physiology  Chronic combined systolic and diastolic heart failure -Patient is status post heart transplant and EF has now corrected to 60 to 65% -Hopefully volume overload will improve with HD  History of cardiac transplant -Continue tacrolimus -Change prednisone to dexamethasone per Dr. Cinda Quest recommendation as this may help with patient's energy. Per Dr. Kennon Rounds, attempted to get in touch with Dr. Eleanora Neighbor team at [facility] to discuss his cardiac transplant medications per patient's request. Despite numerous  attempts, could not get through to their answering service.  Hypothyroidism -Continue Synthroid  Code Status: DNR DVT Prophylaxis: IV heparin Family Communication: Discussed all imaging results, lab results, explained to the patient   Disposition Plan:     Status is: Inpatient  Remains inpatient appropriate because:Inpatient level  of care appropriate due to severity of illness   Dispo: The patient is from: Home              Anticipated d/c is to: Home              Anticipated d/c date is: > 3 days              Patient currently is not medically stable to d/c.      Time Spent in minutes   35 minutes  Procedures:  HD cath 1.17 IR guided biopsy 1/06  Consultants:   Nephrology Oncology Palliative IR  Antimicrobials:   Anti-infectives (From admission, onward)   Start     Dose/Rate Route Frequency Ordered Stop   12/17/20 0830  vancomycin (VANCOCIN) IVPB 1000 mg/200 mL premix        1,000 mg 200 mL/hr over 60 Minutes Intravenous To Radiology 12/17/20 0744 12/17/20 0932   12/24/2020 2200  cefdinir (OMNICEF) capsule 300 mg        300 mg Oral 2 times daily 12/08/2020 1716 12/10/20 2159          Medications  Scheduled Meds: . allopurinol  200 mg Oral Daily  . artificial tears  1 application Both Eyes QHS  . azelastine  1 spray Each Nare Daily   And  . fluticasone  1 spray Each Nare Daily  . cetirizine  10 mg Oral QHS  . Chlorhexidine Gluconate Cloth  6 each Topical Daily  . Chlorhexidine Gluconate Cloth  6 each Topical Q0600  . dexamethasone  4 mg Oral BID WC  . fentaNYL  2 patch Transdermal Q72H  . [START ON 12/18/2020] fentaNYL  1 patch Transdermal Q72H  . gelatin adsorbable      . heparin sodium (porcine)      . heparin sodium (porcine)      . lactulose  10 g Oral BID  . levothyroxine  125 mcg Oral Daily  . lidocaine      . lidocaine      . metoCLOPramide  5 mg Oral TID AC & HS  . polyvinyl alcohol  1 drop Both Eyes TID  . senna-docusate  3 tablet Oral BID  . sodium zirconium cyclosilicate  10 g Oral BID  . Tacrolimus ER  1.5 mg Oral Daily  . traZODone  100 mg Oral QHS   Continuous Infusions: . albumin human    . heparin 950 Units/hr (12/17/20 0928)   PRN Meds:.acetaminophen **OR** acetaminophen, bisacodyl, HYDROmorphone (DILAUDID) injection, magnesium hydroxide, ondansetron **OR**  ondansetron (ZOFRAN) IV, pantoprazole, prochlorperazine      Subjective:   Glenn Gonzalez was seen and examined today.  Seen after HD cath placement, no acute complaints, feels fatigued and worn out.   No acute chest pain or shortness of breath.  No nausea vomiting, fevers or chills.  No acute events overnight.    Objective:   Vitals:   12/17/20 1150 12/17/20 1200 12/17/20 1235 12/17/20 1240  BP: 107/71 (!) 99/46 93/70   Pulse:      Resp: (!) 9 (!) 8 12   Temp:    97.8 F (36.6 C)  TempSrc:    Oral  SpO2:  98%   Weight:   102.9 kg   Height:        Intake/Output Summary (Last 24 hours) at 12/17/2020 1343 Last data filed at 12/17/2020 1235 Gross per 24 hour  Intake 204.68 ml  Output 1675 ml  Net -1470.32 ml     Wt Readings from Last 3 Encounters:  12/17/20 102.9 kg  11/29/20 100.2 kg  08/31/20 101.2 kg     Exam  General: Alert and oriented x 3, NAD  Cardiovascular: S1 S2 auscultated, no murmurs, RRR  Respiratory: CTA B anteriorly, HD cath in right upper chest wall  Gastrointestinal: Soft, nontender, nondistended, + bowel sounds  Ext: 2-3+ pitting edema bilaterally  Neuro: no new deficits  Musculoskeletal: No digital cyanosis, clubbing  Skin: No rashes  Psych: Normal affect and demeanor, alert and oriented x3    Data Reviewed:  I have personally reviewed following labs and imaging studies  Micro Results No results found for this or any previous visit (from the past 240 hour(s)).  Radiology Reports CT ABDOMEN PELVIS WO CONTRAST  Result Date: 12/18/2020 CLINICAL DATA:  Cancer of unknown primary in this 60 year old male. EXAM: CT CHEST, ABDOMEN AND PELVIS WITHOUT CONTRAST TECHNIQUE: Multidetector CT imaging of the chest, abdomen and pelvis was performed following the standard protocol without IV contrast. COMPARISON:  CT abdomen and pelvis of the same date. Also with CT of December 30th of 2021 FINDINGS: CT CHEST FINDINGS Cardiovascular: Calcified  atheromatous plaque in the thoracic aorta. Heart size is normal following sternotomy for heart transplant by report a [facility] center. Three-vessel coronary artery calcification. No pericardial effusion. Calcifications in the LEFT atrium partially seen on previous imaging are of uncertain significance central pulmonary vasculature also with some signs of calcification. Mediastinum/Nodes: Ovoid structure associated with LEFT thyroid 11 mm similar to previous imaging, cervical spine from 2017. Thyroid as described above. Calcifications in the LEFT supraclavicular region may be within venous structures on the LEFT. No axillary lymphadenopathy. Surgical clips in the RIGHT axilla and soft tissue thickening over the LEFT pectoralis musculature similar grossly compared to remote MRI evaluation 2018 and surgical clips seen on numerous priors in the RIGHT axilla. No mediastinal lymphadenopathy. No gross hilar lymphadenopathy. Lungs/Pleura: Small RIGHT of pleural effusion and basilar airspace disease. Small nodule in the superior aspect of the RIGHT middle lobe approximately 3 mm on image 57 of series 5. Biapical scarring. Small nodule in the LEFT upper lobe 6 mm (image 51, series 5) airways are patent. Musculoskeletal: Evidence ir sternotomy without significant bony bridging but with sclerotic margins along the sternotomy line similar to studies dating back to August of 2021 with respect to visualized portions on prior abdomen studies no destructive bone finding or acute bone process. CT ABDOMEN PELVIS FINDINGS Hepatobiliary: 1.9 cm lesion in the RIGHT hepatic lobe near the dome of the RIGHT hemi liver does not display attenuation values of simple cyst is unchanged from the study acquired on the same date at another facility. Additionally a 1.4 cm lesion in the RIGHT hepatic lobe on image 23 of series 2 is unchanged. Sludge in the gallbladder. Tiny low-density foci in the LEFT hepatic lobe without change 1 on  image 19 of series 2 in the lateral segment measuring 5 mm and another in the medial segment of the LEFT hepatic lobe measuring 12 mm on image 21 of series 2 these areas appears similar dating back to August of 2020, lesions in the RIGHT hepatic lobe are new. Pancreas: Normal pancreatic  contour without signs of inflammation. Spleen: Post splenectomy. Adrenals/Urinary Tract: Adrenal glands, normal on the LEFT and obscured on the RIGHT by perinephric and Peri adrenal nodularity. Fullness of the RIGHT renal pelvis with nephroureteral stent in place showing soft tissue density with distension of the renal pelvis and calices similar to the study acquired on the same date. Nephrolithiasis also unchanged. Perinephric nodularity along the medial upper pole the LEFT kidney measures 6.4 x 2.7 cm which is within 1-2 mm of its previous measurement when measured by this observer on the prior study. Increased in size when compared to the study of November 30, 2020. Renal cortical scarring on the LEFT with nephrolithiasis in the lower pole unchanged from very recent comparison. Urinary bladder with small amount of gas in the urinary bladder with distal aspect of the stent in place, loop coiled in the urinary bladder. Stomach/Bowel: Scattered colonic diverticulosis without acute gastrointestinal process. Normal appendix. Stool and contrast throughout the colon. Vascular/Lymphatic: Calcified atheromatous plaque in the abdominal aorta without aneurysmal dilation. Retroperitoneal adenopathy unchanged from scan earlier the same day, largest lymph nodes behind the inferior vena cava and in the intra-aortocaval groove and in the upper abdomen, largest on image 41 of series 2 measuring 1.9 cm. Smaller lymph nodes along the LEFT periaortic chain. Postoperative changes in the retroperitoneum associated with previous lymphadenectomy No pelvic lymphadenopathy. Reproductive: Prostate with uro lift device deployment bilaterally similar to  recent priors. Other: Extensive retroperitoneal fascial thickening/stranding extending into the root of the small bowel mesentery, nodular changes associated with this thickening on the RIGHT outlining the renal fascia this crosses the midline where it is without significant nodularity but with fascial thickening and stranding, also tracking into the pelvis. Small amount of fluid in the pelvis. Musculoskeletal: Spinal degenerative changes. No destructive bone process or acute bone finding. IMPRESSION: 1. Retroperitoneal adenopathy and perinephric nodularity with dilation of the RIGHT renal pelvis and collecting systems, filled with what is suspected to represent neoplasm. 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 Differential considerations given heart transplantation and presumed immunosuppression would include posttransplant lymphoproliferative disorder/lymphoma. 2. Dense material in collecting systems may represent a mixture of tumor and or blood and there is profound dilation of collecting systems of the RIGHT kidney. This is unchanged compared to the recent comparison study. 3. Signs of hepatic involvement with new lesions in the RIGHT hepatic lobe. 4. Ascites with small amount of ascites with very subtle infiltration of fat in the omentum not mentioned above, for instance on image 50 of series 2 raising the question of peritoneal involvement as well. This area is in the range of 2-3 mm. 5. Small RIGHT pleural effusion and basilar airspace disease. 6. Postoperative changes about the chest likely related to prior heart transplantation with surgical clips in the RIGHT axilla and thickening and calcification along the anterior pectoralis musculature not changed since 2018. 7. Calcification in the LEFT atrium also about the aortic root and pulmonary artery and to a lesser degree in the RIGHT atrium likely reflects  changes of vascular anastomotic sites in the setting of heart transplantation. 8. 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 9. Post splenectomy. 10. Aortic atherosclerosis. 11. Small nodule in the LEFT upper lobe 6 mm airways are patent. These results will be called to the ordering clinician or representative by the Radiologist Assistant, and communication documented in the PACS or  [facility]. Aortic Atherosclerosis (ICD10-I70.0). Electronically Signed   By: Zetta Bills M.D.   On: 12/07/2020 17:38   DG Chest 1 View  Result Date: 11/29/2020 CLINICAL DATA:  Hypoxia EXAM: CHEST  1 VIEW COMPARISON:  08/03/2020 FINDINGS: Since the prior examination, there has developed right basilar consolidation, possibly infectious in the appropriate clinical setting. Mild focal infiltrate is also noted within the right apex. Left lung is clear. No pneumothorax or pleural effusion. Median sternotomy has been performed. Cardiac size within normal limits. Multiple healed right rib fractures are noted. Multiple surgical clips are seen within the right axilla. IMPRESSION: Interval development of multifocal pulmonary infiltrate within the right lung, more focal within the right lung base, suspicious for atypical infection in the acute setting. Electronically Signed   By: Fidela Salisbury MD   On: 11/29/2020 22:27   DG Chest 2 View  Result Date: 12/15/2020 CLINICAL DATA:  Bladder cancer.  Follow-up multifocal pneumonia. EXAM: CHEST - 2 VIEW COMPARISON:  11/29/2020 FINDINGS: Prior median sternotomy. Heart is normal size. Patchy right lung airspace disease in the right apex and right lower lung. Minimal left base linear atelectasis or scarring. Overall aeration in the right lung slightly improved. No effusions. IMPRESSION: Patchy right lung airspace disease with slight improvement since prior study. Left base atelectasis or scarring. Electronically  Signed   By: Rolm Baptise M.D.   On: 12/15/2020 13:25   CT CHEST WO CONTRAST  Result Date: 12/30/2020 CLINICAL DATA:  Cancer of unknown primary in this 60 year old male. EXAM: CT CHEST, ABDOMEN AND PELVIS WITHOUT CONTRAST TECHNIQUE: Multidetector CT imaging of the chest, abdomen and pelvis was performed following the standard protocol without IV contrast. COMPARISON:  CT abdomen and pelvis of the same date. Also with CT of December 30th of 2021 FINDINGS: CT CHEST FINDINGS Cardiovascular: Calcified atheromatous plaque in the thoracic aorta. Heart size is normal following sternotomy for heart transplant by report a [facility] center. Three-vessel coronary artery calcification. No pericardial effusion. Calcifications in the LEFT atrium partially seen on previous imaging are of uncertain significance central pulmonary vasculature also with some signs of calcification. Mediastinum/Nodes: Ovoid structure associated with LEFT thyroid 11 mm similar to previous imaging, cervical spine from 2017. Thyroid as described above. Calcifications in the LEFT supraclavicular region may be within venous structures on the LEFT. No axillary lymphadenopathy. Surgical clips in the RIGHT axilla and soft tissue thickening over the LEFT pectoralis musculature similar grossly compared to remote MRI evaluation 2018 and surgical clips seen on numerous priors in the RIGHT axilla. No mediastinal lymphadenopathy. No gross hilar lymphadenopathy. Lungs/Pleura: Small RIGHT of pleural effusion and basilar airspace disease. Small nodule in the superior aspect of the RIGHT middle lobe approximately 3 mm on image 57 of series 5. Biapical scarring. Small nodule in the LEFT upper lobe 6 mm (image 51, series 5) airways are patent. Musculoskeletal: Evidence ir sternotomy without significant bony bridging but with sclerotic margins along the sternotomy line similar to studies dating back to August of 2021 with respect to visualized portions on  prior abdomen studies no destructive bone finding or acute bone process. CT ABDOMEN PELVIS FINDINGS Hepatobiliary: 1.9 cm lesion in the RIGHT hepatic lobe near the dome of the RIGHT hemi liver does not display attenuation values of simple cyst is unchanged from the study acquired on the same date at another facility. Additionally a 1.4 cm lesion in the RIGHT hepatic lobe on image 23 of series 2 is unchanged. Sludge in the gallbladder. Tiny  low-density foci in the LEFT hepatic lobe without change 1 on image 19 of series 2 in the lateral segment measuring 5 mm and another in the medial segment of the LEFT hepatic lobe measuring 12 mm on image 21 of series 2 these areas appears similar dating back to August of 2020, lesions in the RIGHT hepatic lobe are new. Pancreas: Normal pancreatic contour without signs of inflammation. Spleen: Post splenectomy. Adrenals/Urinary Tract: Adrenal glands, normal on the LEFT and obscured on the RIGHT by perinephric and Peri adrenal nodularity. Fullness of the RIGHT renal pelvis with nephroureteral stent in place showing soft tissue density with distension of the renal pelvis and calices similar to the study acquired on the same date. Nephrolithiasis also unchanged. Perinephric nodularity along the medial upper pole the LEFT kidney measures 6.4 x 2.7 cm which is within 1-2 mm of its previous measurement when measured by this observer on the prior study. Increased in size when compared to the study of November 30, 2020. Renal cortical scarring on the LEFT with nephrolithiasis in the lower pole unchanged from very recent comparison. Urinary bladder with small amount of gas in the urinary bladder with distal aspect of the stent in place, loop coiled in the urinary bladder. Stomach/Bowel: Scattered colonic diverticulosis without acute gastrointestinal process. Normal appendix. Stool and contrast throughout the colon. Vascular/Lymphatic: Calcified atheromatous plaque in the abdominal aorta  without aneurysmal dilation. Retroperitoneal adenopathy unchanged from scan earlier the same day, largest lymph nodes behind the inferior vena cava and in the intra-aortocaval groove and in the upper abdomen, largest on image 41 of series 2 measuring 1.9 cm. Smaller lymph nodes along the LEFT periaortic chain. Postoperative changes in the retroperitoneum associated with previous lymphadenectomy No pelvic lymphadenopathy. Reproductive: Prostate with uro lift device deployment bilaterally similar to recent priors. Other: Extensive retroperitoneal fascial thickening/stranding extending into the root of the small bowel mesentery, nodular changes associated with this thickening on the RIGHT outlining the renal fascia this crosses the midline where it is without significant nodularity but with fascial thickening and stranding, also tracking into the pelvis. Small amount of fluid in the pelvis. Musculoskeletal: Spinal degenerative changes. No destructive bone process or acute bone finding. IMPRESSION: 1. Retroperitoneal adenopathy and perinephric nodularity with dilation of the RIGHT renal pelvis and collecting systems, filled with what is suspected to represent neoplasm. 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 Differential considerations given heart transplantation and presumed immunosuppression would include posttransplant lymphoproliferative disorder/lymphoma. 2. Dense material in collecting systems may represent a mixture of tumor and or blood and there is profound dilation of collecting systems of the RIGHT kidney. This is unchanged compared to the recent comparison study. 3. Signs of hepatic involvement with new lesions in the RIGHT hepatic lobe. 4. Ascites with small amount of ascites with very subtle infiltration of fat in the omentum not mentioned above, for instance on image 50 of series 2 raising the question of  peritoneal involvement as well. This area is in the range of 2-3 mm. 5. Small RIGHT pleural effusion and basilar airspace disease. 6. Postoperative changes about the chest likely related to prior heart transplantation with surgical clips in the RIGHT axilla and thickening and calcification along the anterior pectoralis musculature not changed since 2018. 7. Calcification in the LEFT atrium also about the aortic root and pulmonary artery and to a lesser degree in the RIGHT atrium likely reflects changes of vascular anastomotic sites in the  setting of heart transplantation. 8. 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 9. Post splenectomy. 10. Aortic atherosclerosis. 11. Small nodule in the LEFT upper lobe 6 mm airways are patent. These results will be called to the ordering clinician or representative by the Radiologist Assistant, and communication documented in the PACS or [employer]. Aortic Atherosclerosis (ICD10-I70.0). Electronically Signed   By: Zetta Bills M.D.   On: 12/13/2020 17:38   NM Pulmonary Perfusion  Result Date: 12/04/2020 CLINICAL DATA:  Respiratory failure EXAM: NUCLEAR MEDICINE PERFUSION LUNG SCAN TECHNIQUE: Perfusion images were obtained in multiple projections after intravenous injection of radiopharmaceutical. Ventilation scans intentionally deferred if perfusion scan and chest x-ray adequate for interpretation during COVID 19 epidemic. RADIOPHARMACEUTICALS:  4.1 mCi Tc-55m MAA IV COMPARISON:  Chest x-ray 12/17/2020 FINDINGS: Slightly heterogeneous distribution of radiotracer within the lung fields. Multiple small bilateral wedge-shaped perfusion defects. No corresponding radiographic abnormality. No large mismatched segmental perfusion defect. IMPRESSION: Intermediate probability for pulmonary embolism. Electronically Signed   By: Davina Poke D.O.   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 ANESTHESIA/SEDATION: Fentanyl 100 mcg IV; Versed 4 mg IV Sedation time: 14 minutes; The patient was continuously monitored during the procedure by the interventional radiology nurse under my direct supervision. CONTRAST:  None. COMPLICATIONS: SIR Level A - No therapy, no consequence. Retroperitoneal hemorrhage at site of biopsy, controlled with Gel-Foam slurry administration along needle track. PROCEDURE: Informed consent was obtained from the patient following an explanation of the procedure, risks, benefits and alternatives. A time out was performed prior to the initiation of the procedure. The patient was positioned in a partial left lateral decubitus position on the CT table and a limited CT was performed for procedural planning demonstrating similar appearing multifocal right retroperitoneal lymphadenopathy. The procedure was planned. The operative site was prepped and draped in the usual sterile fashion. Appropriate trajectory was confirmed with a 22 gauge spinal needle after the adjacent tissues were anesthetized with 1% Lidocaine with epinephrine. Under intermittent CT guidance, a 17 gauge coaxial needle was advanced into the peripheral aspect of the mass. Appropriate positioning was confirmed and a total of 4 samples were obtained with an 18 gauge core needle biopsy device. A limited CT demonstrated focal hemorrhage about the biopsied lymph node. Gel-Foam slurry was injected through the introducer needle along the needle track. The co-axial needle was removed and hemostasis was achieved with manual compression. Additional limited postprocedural CT was negative for expanding hemorrhage or additional complication. A dressing was placed. The patient tolerated the procedure well without  immediate postprocedural complication. IMPRESSION: Technically successful CT guided core needle biopsy of right retroperitoneal lymph node. Ruthann Cancer, MD [facility] Radiology Electronically Signed   By: Ruthann Cancer MD   On: 12/07/2020 08:53   DG CHEST PORT 1 VIEW  Result Date: 12/10/2020 CLINICAL DATA:  Hypoxia.  History of cardiac transplant EXAM: PORTABLE CHEST 1 VIEW COMPARISON:  12/22/2020 FINDINGS: Prior median sternotomy. Heart size within normal limits. Atherosclerotic calcification of the aortic knob. Subtle right lower lobe opacity, slightly improved from prior. Trace right pleural effusion. No pneumothorax. IMPRESSION: 1. Subtle right lower lobe opacity, slightly improved from prior. 2. Trace right pleural effusion. Electronically Signed   By: Davina Poke D.O.  On: 12/10/2020 10:33   DG C-Arm 1-60 Min-No Report  Result Date: 11/29/2020 Fluoroscopy was utilized by the requesting physician.  No radiographic interpretation.   ECHOCARDIOGRAM COMPLETE  Result Date: 11/30/2020    ECHOCARDIOGRAM REPORT   Patient Name:   HANNAN HUTMACHER Mccluney Date of Exam: 11/30/2020 Medical Rec #:  [id number]         Height:       74.0 in Accession #:    [id number]        Weight:       221.0 lb Date of Birth:  1960-12-30        BSA:          2.268 m Patient Age:    35 years          BP:           103/59 mmHg Patient Gender: M                 HR:           94 bpm. Exam Location:  Inpatient Procedure: 2D Echo, Cardiac Doppler, Color Doppler and Intracardiac            Opacification Agent Indications:    Dyspnea R06.00  History:        Patient has prior history of Echocardiogram examinations, most                 recent 05/12/2017. Arrythmias:Atrial Fibrillation; Risk                 Factors:Non-Smoker. GERD. 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  Sonographer:    Vickie Epley RDCS Referring Phys: [id number] [facility]  Sonographer Comments: Suboptimal apical window and  suboptimal subcostal window. Pt unable to turn on side due to herniated discs. IMPRESSIONS  1. Left ventricular ejection fraction, by estimation, is 60 to 65%. The left ventricle has normal function. The left ventricle has no regional wall motion abnormalities. Left ventricular diastolic parameters were normal.  2. Right ventricular systolic function is normal. The right ventricular size is normal. There is normal pulmonary artery systolic pressure. The estimated right ventricular systolic pressure is 93.7 mmHg.  3. The mitral valve is normal in structure. No evidence of mitral valve regurgitation. No evidence of mitral stenosis.  4. The aortic valve is normal in structure. Aortic valve regurgitation is not visualized. No aortic stenosis is present.  5. The inferior vena cava is normal in size with greater than 50% respiratory variability, suggesting right atrial pressure of 3 mmHg. FINDINGS  Left Ventricle: Left ventricular ejection fraction, by estimation, is 60 to 65%. The left ventricle has normal function. The left ventricle has no regional wall motion abnormalities. Definity contrast agent was given IV to delineate the left ventricular  endocardial borders. The left ventricular internal cavity size was normal in size. There is no left ventricular hypertrophy. Left ventricular diastolic parameters were normal. Normal left ventricular filling pressure. Right Ventricle: The right ventricular size is normal. No increase in right ventricular wall thickness. Right ventricular systolic function is normal. There is normal pulmonary artery systolic pressure. The tricuspid regurgitant velocity is 2.39 m/s, and  with an assumed right atrial pressure of 3 mmHg, the estimated right ventricular systolic pressure is 16.9 mmHg. Left Atrium: Left atrial size was normal in size. Right Atrium: Right atrial size was normal in size. Pericardium: There is no evidence of pericardial effusion. Mitral Valve: The mitral valve is normal  in structure. No evidence of mitral valve regurgitation.  No evidence of mitral valve stenosis. Tricuspid Valve: The tricuspid valve is normal in structure. Tricuspid valve regurgitation is mild . No evidence of tricuspid stenosis. Aortic Valve: The aortic valve is normal in structure. Aortic valve regurgitation is not visualized. No aortic stenosis is present. Pulmonic Valve: The pulmonic valve was normal in structure. Pulmonic valve regurgitation is not visualized. No evidence of pulmonic stenosis. Aorta: The aortic root is normal in size and structure. Venous: The inferior vena cava is normal in size with greater than 50% respiratory variability, suggesting right atrial pressure of 3 mmHg. IAS/Shunts: No atrial level shunt detected by color flow Doppler.  LEFT VENTRICLE PLAX 2D LVIDd:         4.40 cm     Diastology LVIDs:         3.20 cm     LV e' medial:    10.00 cm/s LV PW:         0.90 cm     LV E/e' medial:  7.5 LV IVS:        0.90 cm     LV e' lateral:   11.30 cm/s LVOT diam:     2.50 cm     LV E/e' lateral: 6.7 LV SV:         48 LV SV Index:   21 LVOT Area:     4.91 cm  LV Volumes (MOD) LV vol d, MOD A4C: 80.2 ml LV vol s, MOD A4C: 30.4 ml LV SV MOD A4C:     80.2 ml LEFT ATRIUM           Index LA diam:      2.70 cm 1.19 cm/m LA Vol (A4C): 16.3 ml 7.19 ml/m  AORTIC VALVE LVOT Vmax:   50.20 cm/s LVOT Vmean:  36.700 cm/s LVOT VTI:    0.099 m  AORTA Ao Root diam: 3.40 cm MITRAL VALVE               TRICUSPID VALVE MV Area (PHT): 5.38 cm    TR Peak grad:   22.8 mmHg MV Decel Time: 141 msec    TR Vmax:        239.00 cm/s MV E velocity: 75.40 cm/s MV A velocity: 36.00 cm/s  SHUNTS MV E/A ratio:  2.09        Systemic VTI:  0.10 m                            Systemic Diam: 2.50 cm Dani Gobble Croitoru MD Electronically signed by Sanda Klein MD Signature Date/Time: 11/30/2020/12:30:40 PM    Final    VAS [place] LOWER EXTREMITY VENOUS (DVT)  Result Date: 12/10/2020  Lower Venous DVT Study Indications: Pain.  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 Comparison Study: Previous 12/02/20 Negative Performing Technologist: Vonzell Schlatter RVT  Examination Guidelines: A complete evaluation includes B-mode imaging, spectral Doppler, color Doppler, and power Doppler as needed of all accessible portions of each vessel. Bilateral testing is considered an integral part of a complete examination. Limited examinations for reoccurring indications may be performed as noted. The reflux portion of the exam is performed with the patient in reverse Trendelenburg.  +---------+---------------+---------+-----------+----------+--------------+ RIGHT    CompressibilityPhasicitySpontaneityPropertiesThrombus Aging +---------+---------------+---------+-----------+----------+--------------+ CFV      Full           Yes      Yes                                 +---------+---------------+---------+-----------+----------+--------------+  SFJ      Full                                                        +---------+---------------+---------+-----------+----------+--------------+ FV Prox  Full                                                        +---------+---------------+---------+-----------+----------+--------------+ FV Mid   Full                                                        +---------+---------------+---------+-----------+----------+--------------+ FV DistalFull                                                        +---------+---------------+---------+-----------+----------+--------------+ PFV      Full                                                        +---------+---------------+---------+-----------+----------+--------------+ POP      Full           Yes      Yes                                 +---------+---------------+---------+-----------+----------+--------------+ PTV      None                                                         +---------+---------------+---------+-----------+----------+--------------+ PERO     None                                                        +---------+---------------+---------+-----------+----------+--------------+   +---------+---------------+---------+-----------+----------+--------------+ LEFT     CompressibilityPhasicitySpontaneityPropertiesThrombus Aging +---------+---------------+---------+-----------+----------+--------------+ CFV      Full           Yes      Yes                                 +---------+---------------+---------+-----------+----------+--------------+ SFJ      Full                                                        +---------+---------------+---------+-----------+----------+--------------+  FV Prox  Full                                                        +---------+---------------+---------+-----------+----------+--------------+ FV Mid   Full                                                        +---------+---------------+---------+-----------+----------+--------------+ FV DistalFull                                                        +---------+---------------+---------+-----------+----------+--------------+ PFV      Full                                                        +---------+---------------+---------+-----------+----------+--------------+ POP      Full           Yes      Yes                                 +---------+---------------+---------+-----------+----------+--------------+ PTV      Full                                                        +---------+---------------+---------+-----------+----------+--------------+ PERO     Full                                                        +---------+---------------+---------+-----------+----------+--------------+     Summary: RIGHT: - Findings consistent with acute deep vein thrombosis involving one right posterior tibial vein,  and both right peroneal veins. - No cystic structure found in the popliteal fossa.  LEFT: - There is no evidence of deep vein thrombosis in the lower extremity.  - No cystic structure found in the popliteal fossa.  *See table(s) above for measurements and observations. Electronically signed by Jamelle Haring on 12/10/2020 at 7:16:00 PM.    Final    VAS [place] LOWER EXTREMITY VENOUS (DVT)  Result Date: 12/03/2020  Lower Venous DVT Study Indications: Pain.  Comparison Study: Previous 12/2019 Performing Technologist: Vonzell Schlatter RVT  Examination Guidelines: A complete evaluation includes B-mode imaging, spectral Doppler, color Doppler, and power Doppler as needed of all accessible portions of each vessel. Bilateral testing is considered an integral part of a complete examination. Limited examinations for reoccurring indications may be performed as noted. The reflux portion of the exam is performed with the patient in reverse Trendelenburg.  +---------+---------------+---------+-----------+----------+--------------+ RIGHT  CompressibilityPhasicitySpontaneityPropertiesThrombus Aging +---------+---------------+---------+-----------+----------+--------------+ CFV      Full           Yes      Yes                                 +---------+---------------+---------+-----------+----------+--------------+ SFJ      Full                                                        +---------+---------------+---------+-----------+----------+--------------+ FV Prox  Full                                                        +---------+---------------+---------+-----------+----------+--------------+ FV Mid   Full                                                        +---------+---------------+---------+-----------+----------+--------------+ FV DistalFull                                                        +---------+---------------+---------+-----------+----------+--------------+ PFV       Full                                                        +---------+---------------+---------+-----------+----------+--------------+ POP      Full           Yes      Yes                                 +---------+---------------+---------+-----------+----------+--------------+ PTV      Full                                                        +---------+---------------+---------+-----------+----------+--------------+ PERO     Full                                                        +---------+---------------+---------+-----------+----------+--------------+   +---------+---------------+---------+-----------+----------+--------------+ LEFT     CompressibilityPhasicitySpontaneityPropertiesThrombus Aging +---------+---------------+---------+-----------+----------+--------------+ CFV      Full           Yes      Yes                                 +---------+---------------+---------+-----------+----------+--------------+  SFJ      Full                                                        +---------+---------------+---------+-----------+----------+--------------+ FV Prox  Full                                                        +---------+---------------+---------+-----------+----------+--------------+ FV Mid   Full                                                        +---------+---------------+---------+-----------+----------+--------------+ FV DistalFull                                                        +---------+---------------+---------+-----------+----------+--------------+ PFV      Full                                                        +---------+---------------+---------+-----------+----------+--------------+ POP      Full           Yes      Yes                                 +---------+---------------+---------+-----------+----------+--------------+ PTV      Full                                                         +---------+---------------+---------+-----------+----------+--------------+ PERO     Full                                                        +---------+---------------+---------+-----------+----------+--------------+     Summary: RIGHT: - There is no evidence of deep vein thrombosis in the lower extremity.  - No cystic structure found in the popliteal fossa.  LEFT: - There is no evidence of deep vein thrombosis in the lower extremity.  - No cystic structure found in the popliteal fossa.  *See table(s) above for measurements and observations. Electronically signed by Ruta Hinds MD on 12/03/2020 at 12:15:41 PM.    Final    ECHOCARDIOGRAM LIMITED  Result Date: 12/10/2020    ECHOCARDIOGRAM LIMITED REPORT   Patient Name:   KLARK VANDERHOEF Honor Date of Exam: 12/10/2020 Medical Rec #:  [id number]  Height:       74.0 in Accession #:    [id number]        Weight:       230.4 lb Date of Birth:  [birth year]        BSA:          2.308 m Patient Age:    52 years          BP:           105/72 mmHg Patient Gender: M                 HR:           99 bpm. Exam Location:  Inpatient Procedure: Limited Echo, Cardiac Doppler and Color Doppler Indications:    Acute DVT  History:        Patient has prior history of Echocardiogram examinations, most                 recent 11/30/2020. Arrythmias:Atrial Fibrillation. CKD. GERD.                 S/P heart transplant 01/22/16.  Sonographer:    Clayton Lefort RDCS (AE) Referring Phys: [id number] [facility] D NETTEY  Sonographer Comments: Technically difficult study due to poor echo windows, suboptimal apical window and suboptimal subcostal window. Unable to roll patient due to herniated discs. IMPRESSIONS  1. S/P Heart Transplant. Left ventricular ejection fraction, by estimation, is 60 to 65%. The left ventricle has normal function. Left ventricular endocardial border not optimally defined to evaluate regional wall motion. There is moderate concentric left ventricular  hypertrophy. Left ventricular diastolic function could not be evaluated.  2. Right ventricular systolic function is moderately reduced. The right ventricular size is mildly enlarged. Tricuspid regurgitation signal is inadequate for assessing PA pressure.  3. A small pericardial effusion is present. The pericardial effusion is surrounding the apex.  4. Tricuspid valve regurgitation is mild to moderate. FINDINGS  Left Ventricle: S/P Heart Transplant. Left ventricular ejection fraction, by estimation, is 60 to 65%. The left ventricle has normal function. Left ventricular endocardial border not optimally defined to evaluate regional wall motion. There is moderate concentric left ventricular hypertrophy. Left ventricular diastolic function could not be evaluated. Right Ventricle: The right ventricular size is mildly enlarged. Right ventricular systolic function is moderately reduced. Tricuspid regurgitation signal is inadequate for assessing PA pressure. Pericardium: A small pericardial effusion is present. The pericardial effusion is surrounding the apex. Tricuspid Valve: Tricuspid valve regurgitation is mild to moderate. Venous: The inferior vena cava was not well visualized. LEFT VENTRICLE PLAX 2D LVIDd:         3.60 cm LVIDs:         2.30 cm LV PW:         1.40 cm LV IVS:        1.50 cm LVOT diam:     2.50 cm LVOT Area:     4.91 cm  LEFT ATRIUM         Index LA diam:    2.80 cm 1.21 cm/m   AORTA Ao Root diam: 3.70 cm Ao Asc diam:  3.10 cm TRICUSPID VALVE TR Peak grad:   52.4 mmHg TR Vmax:        362.00 cm/s  SHUNTS Systemic Diam: 2.50 cm Fransico Him MD Electronically signed by Fransico Him MD Signature Date/Time: 12/10/2020/3:35:06 PM    Final     Lab Data:  CBC: Recent Labs  Lab 12/13/20 0657 12/14/20 0717 12/15/20  3276 12/16/20 0857 12/17/20 0319  WBC 23.5* 25.0* 19.9* 23.5* 22.1*  HGB 13.8 13.7 14.1 14.3 14.5  HCT 41.2 39.9 41.7 44.1 43.8  MCV 94.9 92.4 94.1 95.9 94.0  PLT 444* 464* 472* 472*  147*   Basic Metabolic Panel: Recent Labs  Lab 12/13/20 0657 12/14/20 0717 12/15/20 0755 12/16/20 0857 12/17/20 0319  NA 124* 125* 125* 123* 122*  K 4.9 4.9 5.4* 5.9* 5.9*  CL 90* 91* 90* 86* 87*  CO2 20* 22 19* 20* 18*  GLUCOSE 124* 117* 122* 138* 122*  BUN 65* 74* 87* 105* 120*  CREATININE 3.25* 3.48* 3.51* 4.68* 5.26*  CALCIUM 9.2 9.1 9.2 8.9 8.6*   GFR: Estimated Creatinine Clearance: 19.4 mL/min (A) (by C-G formula based on SCr of 5.26 mg/dL (H)). Liver Function Tests: Recent Labs  Lab 12/11/20 0604 12/12/20 0616  AST 19 20  ALT 10 9  ALKPHOS 60 60  BILITOT 0.5 0.8  PROT 5.5* 5.8*  ALBUMIN 2.5* 2.8*   No results for input(s): LIPASE, AMYLASE in the last 168 hours. No results for input(s): AMMONIA in the last 168 hours. Coagulation Profile: No results for input(s): INR, PROTIME in the last 168 hours. Cardiac Enzymes: No results for input(s): CKTOTAL, CKMB, CKMBINDEX, TROPONINI in the last 168 hours. BNP (last 3 results) No results for input(s): PROBNP in the last 8760 hours. HbA1C: No results for input(s): HGBA1C in the last 72 hours. CBG: No results for input(s): GLUCAP in the last 168 hours. Lipid Profile: No results for input(s): CHOL, HDL, LDLCALC, TRIG, CHOLHDL, LDLDIRECT in the last 72 hours. Thyroid Function Tests: No results for input(s): TSH, T4TOTAL, FREET4, T3FREE, THYROIDAB in the last 72 hours. Anemia Panel: No results for input(s): VITAMINB12, FOLATE, FERRITIN, TIBC, IRON, RETICCTPCT in the last 72 hours. Urine analysis:    Component Value Date/Time   COLORURINE YELLOW 12/15/2020 1557   APPEARANCEUR CLOUDY (A) 12/15/2020 1557   LABSPEC 1.011 12/15/2020 1557   PHURINE 5.0 12/15/2020 1557   GLUCOSEU NEGATIVE 12/15/2020 1557   HGBUR LARGE (A) 12/15/2020 1557   HGBUR negative 05/12/2008 0957   BILIRUBINUR NEGATIVE 12/15/2020 [place] 12/15/2020 1557   PROTEINUR 30 (A) 12/15/2020 1557   UROBILINOGEN 0.2 07/31/2015 2311    NITRITE NEGATIVE 12/15/2020 1557   LEUKOCYTESUR TRACE (A) 12/15/2020 1557     Marshal Eskew M.D. [facility] 12/17/2020, 1:43 PM   Call night coverage person covering after 7pm

## 2020-12-17 NOTE — Sedation Documentation (Signed)
Pt transported back to 5M06 via bed accompanied by IR RNs. Heparin restarted at 950 units/hr. Pt awake. No complaints at this time. No s/s of distress.

## 2020-12-18 DIAGNOSIS — C7910 Secondary malignant neoplasm of unspecified urinary organs: Secondary | ICD-10-CM | POA: Diagnosis not present

## 2020-12-18 LAB — HEPARIN LEVEL (UNFRACTIONATED)
Heparin Unfractionated: 0.16 IU/mL — ABNORMAL LOW (ref 0.30–0.70)
Heparin Unfractionated: 0.16 IU/mL — ABNORMAL LOW (ref 0.30–0.70)

## 2020-12-18 LAB — CBC
HCT: 41.8 % (ref 39.0–52.0)
Hemoglobin: 13.9 g/dL (ref 13.0–17.0)
MCH: 31.2 pg (ref 26.0–34.0)
MCHC: 33.3 g/dL (ref 30.0–36.0)
MCV: 93.9 fL (ref 80.0–100.0)
Platelets: 360 10*3/uL (ref 150–400)
RBC: 4.45 MIL/uL (ref 4.22–5.81)
RDW: 15.6 % — ABNORMAL HIGH (ref 11.5–15.5)
WBC: 24.1 10*3/uL — ABNORMAL HIGH (ref 4.0–10.5)
nRBC: 0.1 % (ref 0.0–0.2)

## 2020-12-18 LAB — BASIC METABOLIC PANEL
Anion gap: 15 (ref 5–15)
BUN: 100 mg/dL — ABNORMAL HIGH (ref 6–20)
CO2: 21 mmol/L — ABNORMAL LOW (ref 22–32)
Calcium: 8.6 mg/dL — ABNORMAL LOW (ref 8.9–10.3)
Chloride: 89 mmol/L — ABNORMAL LOW (ref 98–111)
Creatinine, Ser: 4.56 mg/dL — ABNORMAL HIGH (ref 0.61–1.24)
GFR, Estimated: 14 mL/min — ABNORMAL LOW (ref >60)
Glucose, Bld: 105 mg/dL — ABNORMAL HIGH (ref 70–99)
Potassium: 5.2 mmol/L — ABNORMAL HIGH (ref 3.5–5.1)
Sodium: 125 mmol/L — ABNORMAL LOW (ref 135–145)

## 2020-12-18 NOTE — Progress Notes (Addendum)
HEMATOLOGY-ONCOLOGY PROGRESS NOTE  SUBJECTIVE: The patient started on dialysis on 12/17/2020.  He has slight improvement in the creatinine.  REVIEW OF SYSTEMS:   Constitutional: Denies fevers, chills Eyes: Denies blurriness of vision Ears, nose, mouth, throat, and face: Denies mucositis or sore throat Respiratory: Denies cough, dyspnea or wheezes Cardiovascular: Denies palpitation, chest discomfort Gastrointestinal:  Denies nausea, heartburn or change in bowel habits Skin: Denies abnormal skin rashes Lymphatics: Denies new lymphadenopathy or easy bruising Neurological:Denies numbness, tingling or new weaknesses Behavioral/Psych: Mood is stable, no new changes  Extremities: No lower extremity edema All other systems were reviewed with the patient and are negative.  I have reviewed the past medical history, past surgical history, social history and family history with the patient and they are unchanged from previous note.   PHYSICAL EXAMINATION: ECOG PERFORMANCE STATUS: 3 - Symptomatic, >50% confined to bed  Vitals:   12/18/20 0441 12/18/20 0930  BP: 101/67 91/63  Pulse: 86 86  Resp: 18 17  Temp: 98.2 F (36.8 C) 97.9 F (36.6 C)  SpO2: (!) 89% 91%   Filed Weights   12/17/20 1036 12/17/20 1235 12/18/20 0445  Weight: 104 kg 102.9 kg 104.7 kg    Intake/Output from previous day: 01/17 0701 - 01/18 0700 In: 811 [P.O.:300; I.V.:327] Out: 1710 [Urine:610]  Not fully examined as the patient was on the bedpan  GENERAL:alert, no distress and comfortable NEURO: alert & oriented x 3 with fluent speech, no focal motor/sensory deficits  LABORATORY DATA:  I have reviewed the data as listed CMP Latest Ref Rng & Units 12/18/2020 12/17/2020 12/16/2020  Glucose 70 - 99 mg/dL 105(H) 122(H) 138(H)  BUN 6 - 20 mg/dL 100(H) 120(H) 105(H)  Creatinine 0.61 - 1.24 mg/dL 4.56(H) 5.26(H) 4.68(H)  Sodium 135 - 145 mmol/L 125(L) 122(L) 123(L)  Potassium 3.5 - 5.1 mmol/L 5.2(H) 5.9(H) 5.9(H)   Chloride 98 - 111 mmol/L 89(L) 87(L) 86(L)  CO2 22 - 32 mmol/L 21(L) 18(L) 20(L)  Calcium 8.9 - 10.3 mg/dL 8.6(L) 8.6(L) 8.9  Total Protein 6.5 - 8.1 g/dL - - -  Total Bilirubin 0.3 - 1.2 mg/dL - - -  Alkaline Phos 38 - 126 U/L - - -  AST 15 - 41 U/L - - -  ALT 0 - 44 U/L - - -    Lab Results  Component Value Date   WBC 24.1 (H) 12/18/2020   HGB 13.9 12/18/2020   HCT 41.8 12/18/2020   MCV 93.9 12/18/2020   PLT 360 12/18/2020   NEUTROABS 16.6 (H) 12/15/2020    CT ABDOMEN PELVIS WO CONTRAST  Result Date: 12/28/2020 CLINICAL DATA:  Cancer of unknown primary in this 60 year old male. EXAM: CT CHEST, ABDOMEN AND PELVIS WITHOUT CONTRAST TECHNIQUE: Multidetector CT imaging of the chest, abdomen and pelvis was performed following the standard protocol without IV contrast. COMPARISON:  CT abdomen and pelvis of the same date. Also with CT of December 30th of 2021 FINDINGS: CT CHEST FINDINGS Cardiovascular: Calcified atheromatous plaque in the thoracic aorta. Heart size is normal following sternotomy for heart transplant by report a [facility]. Three-vessel coronary artery calcification. No pericardial effusion. Calcifications in the LEFT atrium partially seen on previous imaging are of uncertain significance central pulmonary vasculature also with some signs of calcification. Mediastinum/Nodes: Ovoid structure associated with LEFT thyroid 11 mm similar to previous imaging, cervical spine from 2017. Thyroid as described above. Calcifications in the LEFT supraclavicular region may be within venous structures on the LEFT. No axillary lymphadenopathy. Surgical  clips in the RIGHT axilla and soft tissue thickening over the LEFT pectoralis musculature similar grossly compared to remote MRI evaluation 2018 and surgical clips seen on numerous priors in the RIGHT axilla. No mediastinal lymphadenopathy. No gross hilar lymphadenopathy. Lungs/Pleura: Small RIGHT of pleural effusion and basilar  airspace disease. Small nodule in the superior aspect of the RIGHT middle lobe approximately 3 mm on image 57 of series 5. Biapical scarring. Small nodule in the LEFT upper lobe 6 mm (image 51, series 5) airways are patent. Musculoskeletal: Evidence ir sternotomy without significant bony bridging but with sclerotic margins along the sternotomy line similar to studies dating back to August of 2021 with respect to visualized portions on prior abdomen studies no destructive bone finding or acute bone process. CT ABDOMEN PELVIS FINDINGS Hepatobiliary: 1.9 cm lesion in the RIGHT hepatic lobe near the dome of the RIGHT hemi liver does not display attenuation values of simple cyst is unchanged from the study acquired on the same date at another facility. Additionally a 1.4 cm lesion in the RIGHT hepatic lobe on image 23 of series 2 is unchanged. Sludge in the gallbladder. Tiny low-density foci in the LEFT hepatic lobe without change 1 on image 19 of series 2 in the lateral segment measuring 5 mm and another in the medial segment of the LEFT hepatic lobe measuring 12 mm on image 21 of series 2 these areas appears similar dating back to August of 2020, lesions in the RIGHT hepatic lobe are new. Pancreas: Normal pancreatic contour without signs of inflammation. Spleen: Post splenectomy. Adrenals/Urinary Tract: Adrenal glands, normal on the LEFT and obscured on the RIGHT by perinephric and Peri adrenal nodularity. Fullness of the RIGHT renal pelvis with nephroureteral stent in place showing soft tissue density with distension of the renal pelvis and calices similar to the study acquired on the same date. Nephrolithiasis also unchanged. Perinephric nodularity along the medial upper pole the LEFT kidney measures 6.4 x 2.7 cm which is within 1-2 mm of its previous measurement when measured by this observer on the prior study. Increased in size when compared to the study of November 30, 2020. Renal cortical scarring on the LEFT  with nephrolithiasis in the lower pole unchanged from very recent comparison. Urinary bladder with small amount of gas in the urinary bladder with distal aspect of the stent in place, loop coiled in the urinary bladder. Stomach/Bowel: Scattered colonic diverticulosis without acute gastrointestinal process. Normal appendix. Stool and contrast throughout the colon. Vascular/Lymphatic: Calcified atheromatous plaque in the abdominal aorta without aneurysmal dilation. Retroperitoneal adenopathy unchanged from scan earlier the same day, largest lymph nodes behind the inferior vena cava and in the intra-aortocaval groove and in the upper abdomen, largest on image 41 of series 2 measuring 1.9 cm. Smaller lymph nodes along the LEFT periaortic chain. Postoperative changes in the retroperitoneum associated with previous lymphadenectomy No pelvic lymphadenopathy. Reproductive: Prostate with uro lift device deployment bilaterally similar to recent priors. Other: Extensive retroperitoneal fascial thickening/stranding extending into the root of the small bowel mesentery, nodular changes associated with this thickening on the RIGHT outlining the renal fascia this crosses the midline where it is without significant nodularity but with fascial thickening and stranding, also tracking into the pelvis. Small amount of fluid in the pelvis. Musculoskeletal: Spinal degenerative changes. No destructive bone process or acute bone finding. IMPRESSION: 1. Retroperitoneal adenopathy and perinephric nodularity with dilation of the RIGHT renal pelvis and collecting systems, filled with what is suspected to represent neoplasm. 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 Differential considerations given heart transplantation and presumed immunosuppression would include posttransplant lymphoproliferative disorder/lymphoma. 2. Dense material in  collecting systems may represent a mixture of tumor and or blood and there is profound dilation of collecting systems of the RIGHT kidney. This is unchanged compared to the recent comparison study. 3. Signs of hepatic involvement with new lesions in the RIGHT hepatic lobe. 4. Ascites with small amount of ascites with very subtle infiltration of fat in the omentum not mentioned above, for instance on image 50 of series 2 raising the question of peritoneal involvement as well. This area is in the range of 2-3 mm. 5. Small RIGHT pleural effusion and basilar airspace disease. 6. Postoperative changes about the chest likely related to prior heart transplantation with surgical clips in the RIGHT axilla and thickening and calcification along the anterior pectoralis musculature not changed since 2018. 7. Calcification in the LEFT atrium also about the aortic root and pulmonary artery and to a lesser degree in the RIGHT atrium likely reflects changes of vascular anastomotic sites in the setting of heart transplantation. 8. 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 9. Post splenectomy. 10. Aortic atherosclerosis. 11. Small nodule in the LEFT upper lobe 6 mm airways are patent. These results will be called to the ordering clinician or representative by the Radiologist Assistant, and communication documented in the PACS or [employer]. Aortic Atherosclerosis (ICD10-I70.0). Electronically Signed   By: Zetta Bills M.D.   On: 12/13/2020 17:38   DG Chest 1 View  Result Date: 11/29/2020 CLINICAL DATA:  Hypoxia EXAM: CHEST  1 VIEW COMPARISON:  08/03/2020 FINDINGS: Since the prior examination, there has developed right basilar consolidation, possibly infectious in the appropriate clinical setting. Mild focal infiltrate is also noted within the right apex. Left lung is clear. No pneumothorax or pleural effusion. Median sternotomy has been  performed. Cardiac size within normal limits. Multiple healed right rib fractures are noted. Multiple surgical clips are seen within the right axilla. IMPRESSION: Interval development of multifocal pulmonary infiltrate within the right lung, more focal within the right lung base, suspicious for atypical infection in the acute setting. Electronically Signed   By: Fidela Salisbury MD   On: 11/29/2020 22:27   DG Chest 2 View  Result Date: 12/21/2020 CLINICAL DATA:  Bladder cancer.  Follow-up multifocal pneumonia. EXAM: CHEST - 2 VIEW COMPARISON:  11/29/2020 FINDINGS: Prior median sternotomy. Heart is normal size. Patchy right lung airspace disease in the right apex and right lower lung. Minimal left base linear atelectasis or scarring. Overall aeration in the right lung slightly improved. No effusions. IMPRESSION: Patchy right lung airspace disease with slight improvement since prior study. Left base atelectasis or scarring. Electronically Signed   By: Rolm Baptise M.D.   On: 12/18/2020 13:25   CT CHEST WO CONTRAST  Result Date: 12/26/2020 CLINICAL DATA:  Cancer of unknown primary in this 60 year old male. EXAM: CT CHEST, ABDOMEN AND PELVIS WITHOUT CONTRAST TECHNIQUE: Multidetector CT imaging of the chest, abdomen and pelvis was performed following the standard protocol without IV contrast. COMPARISON:  CT abdomen and pelvis of the same date. Also with CT of December 30th of 2021 FINDINGS: CT CHEST FINDINGS Cardiovascular: Calcified atheromatous plaque in the thoracic aorta. Heart size is normal following sternotomy for heart transplant by report a [facility] center. Three-vessel coronary artery calcification. No  pericardial effusion. Calcifications in the LEFT atrium partially seen on previous imaging are of uncertain significance central pulmonary vasculature also with some signs of calcification. Mediastinum/Nodes: Ovoid structure associated with LEFT thyroid 11 mm similar to previous imaging,  cervical spine from 2017. Thyroid as described above. Calcifications in the LEFT supraclavicular region may be within venous structures on the LEFT. No axillary lymphadenopathy. Surgical clips in the RIGHT axilla and soft tissue thickening over the LEFT pectoralis musculature similar grossly compared to remote MRI evaluation 2018 and surgical clips seen on numerous priors in the RIGHT axilla. No mediastinal lymphadenopathy. No gross hilar lymphadenopathy. Lungs/Pleura: Small RIGHT of pleural effusion and basilar airspace disease. Small nodule in the superior aspect of the RIGHT middle lobe approximately 3 mm on image 57 of series 5. Biapical scarring. Small nodule in the LEFT upper lobe 6 mm (image 51, series 5) airways are patent. Musculoskeletal: Evidence ir sternotomy without significant bony bridging but with sclerotic margins along the sternotomy line similar to studies dating back to August of 2021 with respect to visualized portions on prior abdomen studies no destructive bone finding or acute bone process. CT ABDOMEN PELVIS FINDINGS Hepatobiliary: 1.9 cm lesion in the RIGHT hepatic lobe near the dome of the RIGHT hemi liver does not display attenuation values of simple cyst is unchanged from the study acquired on the same date at another facility. Additionally a 1.4 cm lesion in the RIGHT hepatic lobe on image 23 of series 2 is unchanged. Sludge in the gallbladder. Tiny low-density foci in the LEFT hepatic lobe without change 1 on image 19 of series 2 in the lateral segment measuring 5 mm and another in the medial segment of the LEFT hepatic lobe measuring 12 mm on image 21 of series 2 these areas appears similar dating back to August of 2020, lesions in the RIGHT hepatic lobe are new. Pancreas: Normal pancreatic contour without signs of inflammation. Spleen: Post splenectomy. Adrenals/Urinary Tract: Adrenal glands, normal on the LEFT and obscured on the RIGHT by perinephric and Peri adrenal nodularity.  Fullness of the RIGHT renal pelvis with nephroureteral stent in place showing soft tissue density with distension of the renal pelvis and calices similar to the study acquired on the same date. Nephrolithiasis also unchanged. Perinephric nodularity along the medial upper pole the LEFT kidney measures 6.4 x 2.7 cm which is within 1-2 mm of its previous measurement when measured by this observer on the prior study. Increased in size when compared to the study of November 30, 2020. Renal cortical scarring on the LEFT with nephrolithiasis in the lower pole unchanged from very recent comparison. Urinary bladder with small amount of gas in the urinary bladder with distal aspect of the stent in place, loop coiled in the urinary bladder. Stomach/Bowel: Scattered colonic diverticulosis without acute gastrointestinal process. Normal appendix. Stool and contrast throughout the colon. Vascular/Lymphatic: Calcified atheromatous plaque in the abdominal aorta without aneurysmal dilation. Retroperitoneal adenopathy unchanged from scan earlier the same day, largest lymph nodes behind the inferior vena cava and in the intra-aortocaval groove and in the upper abdomen, largest on image 41 of series 2 measuring 1.9 cm. Smaller lymph nodes along the LEFT periaortic chain. Postoperative changes in the retroperitoneum associated with previous lymphadenectomy No pelvic lymphadenopathy. Reproductive: Prostate with uro lift device deployment bilaterally similar to recent priors. Other: Extensive retroperitoneal fascial thickening/stranding extending into the root of the small bowel mesentery, nodular changes associated with this thickening on the RIGHT outlining the renal fascia this crosses the  midline where it is without significant nodularity but with fascial thickening and stranding, also tracking into the pelvis. Small amount of fluid in the pelvis. Musculoskeletal: Spinal degenerative changes. No destructive bone process or acute bone  finding. IMPRESSION: 1. Retroperitoneal adenopathy and perinephric nodularity with dilation of the RIGHT renal pelvis and collecting systems, filled with what is suspected to represent neoplasm. 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 Differential considerations given heart transplantation and presumed immunosuppression would include posttransplant lymphoproliferative disorder/lymphoma. 2. Dense material in collecting systems may represent a mixture of tumor and or blood and there is profound dilation of collecting systems of the RIGHT kidney. This is unchanged compared to the recent comparison study. 3. Signs of hepatic involvement with new lesions in the RIGHT hepatic lobe. 4. Ascites with small amount of ascites with very subtle infiltration of fat in the omentum not mentioned above, for instance on image 50 of series 2 raising the question of peritoneal involvement as well. This area is in the range of 2-3 mm. 5. Small RIGHT pleural effusion and basilar airspace disease. 6. Postoperative changes about the chest likely related to prior heart transplantation with surgical clips in the RIGHT axilla and thickening and calcification along the anterior pectoralis musculature not changed since 2018. 7. Calcification in the LEFT atrium also about the aortic root and pulmonary artery and to a lesser degree in the RIGHT atrium likely reflects changes of vascular anastomotic sites in the setting of heart transplantation. 8. 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 9. Post splenectomy. 10. Aortic atherosclerosis. 11. Small nodule in the LEFT upper lobe 6 mm airways are patent. These results will be called to the ordering clinician or representative by the Radiologist Assistant, and communication documented in the PACS or [employer]. Aortic  Atherosclerosis (ICD10-I70.0). Electronically Signed   By: Zetta Bills M.D.   On: 12/19/2020 17:38   NM Pulmonary Perfusion  Result Date: 12/24/2020 CLINICAL DATA:  Respiratory failure EXAM: NUCLEAR MEDICINE PERFUSION LUNG SCAN TECHNIQUE: Perfusion images were obtained in multiple projections after intravenous injection of radiopharmaceutical. Ventilation scans intentionally deferred if perfusion scan and chest x-ray adequate for interpretation during COVID 19 epidemic. RADIOPHARMACEUTICALS:  4.1 mCi Tc-70mMAA IV COMPARISON:  Chest x-ray 12/21/2020 FINDINGS: Slightly heterogeneous distribution of radiotracer within the lung fields. Multiple small bilateral wedge-shaped perfusion defects. No corresponding radiographic abnormality. No large mismatched segmental perfusion defect. IMPRESSION: Intermediate probability for pulmonary embolism. Electronically Signed   By: NDavina PokeD.O.   On: 12/03/2020 13:30   IR Fluoro Guide CV Line Left  Result Date: 12/17/2020 INDICATION: 60year old male referred for hemodialysis catheter placement EXAM: TUNNELED CENTRAL VENOUS HEMODIALYSIS CATHETER PLACEMENT WITH ULTRASOUND AND FLUOROSCOPIC GUIDANCE MEDICATIONS: 1 g vancomycin. The antibiotic was given in an appropriate time interval prior to skin puncture. ANESTHESIA/SEDATION: No moderate sedation. A total of Versed 0.5 mg and Fentanyl 0 mcg was administered intravenously. Moderate Sedation Time: 0 minutes. The patient's level of consciousness and vital signs were monitored continuously by radiology nursing throughout the procedure under my direct supervision. FLUOROSCOPY TIME:  Fluoroscopy Time: 0 minutes 54 seconds (3 mGy). COMPLICATIONS: None PROCEDURE: Informed written consent was obtained from the patient after a discussion of the risks, benefits, and alternatives to treatment. Questions regarding the procedure were encouraged and answered. The right neck and chest were prepped with chlorhexidine in a sterile  fashion, and  a sterile drape was applied covering the operative field. Maximum barrier sterile technique with sterile gowns and gloves were used for the procedure. A timeout was performed prior to the initiation of the procedure. Ultrasound survey was performed. Micropuncture kit was utilized to access the right internal jugular vein under direct, real-time ultrasound guidance after the overlying soft tissues were anesthetized with 1% lidocaine with epinephrine. Stab incision was made with 11 blade scalpel. Microwire was passed centrally. The microwire was then marked to measure appropriate internal catheter length. External tunneled length was estimated. A total tip to cuff length of 19 cm was selected. 035 guidewire was advanced to the level of the IVC. Skin and subcutaneous tissues of chest wall below the clavicle were generously infiltrated with 1% lidocaine for local anesthesia. A small stab incision was made with 11 blade scalpel. The selected hemodialysis catheter was tunneled in a retrograde fashion from the anterior chest wall to the venotomy incision. Serial dilation was performed and then a peel-away sheath was placed. The catheter was then placed through the peel-away sheath with tips ultimately positioned within the superior aspect of the right atrium. Final catheter positioning was confirmed and documented with a spot radiographic image. The catheter aspirates and flushes normally. The catheter was flushed with appropriate volume heparin dwells. The catheter exit site was secured with a 0-Prolene retention suture. Gel-Foam slurry was infused into the soft tissue tract. The venotomy incision was closed Derma bond and sterile dressing. Dressings were applied at the chest wall. Patient tolerated the procedure well and remained hemodynamically stable throughout. No complications were encountered and no significant blood loss encountered. IMPRESSION: Status post right IJ tunneled hemodialysis catheter  placement. Catheter ready for use. Signed, Dulcy Fanny. Dellia Nims, RPVI [facility] Electronically Signed   By: Corrie Mckusick D.O.   On: 12/17/2020 15:26   IR US Guide Vasc Access Left  Result Date: 12/17/2020 INDICATION: 60 year old male referred for hemodialysis catheter placement EXAM: TUNNELED CENTRAL VENOUS HEMODIALYSIS CATHETER PLACEMENT WITH ULTRASOUND AND FLUOROSCOPIC GUIDANCE MEDICATIONS: 1 g vancomycin. The antibiotic was given in an appropriate time interval prior to skin puncture. ANESTHESIA/SEDATION: No moderate sedation. A total of Versed 0.5 mg and Fentanyl 0 mcg was administered intravenously. Moderate Sedation Time: 0 minutes. The patient's level of consciousness and vital signs were monitored continuously by radiology nursing throughout the procedure under my direct supervision. FLUOROSCOPY TIME:  Fluoroscopy Time: 0 minutes 54 seconds (3 mGy). COMPLICATIONS: None PROCEDURE: Informed written consent was obtained from the patient after a discussion of the risks, benefits, and alternatives to treatment. Questions regarding the procedure were encouraged and answered. The right neck and chest were prepped with chlorhexidine in a sterile fashion, and a sterile drape was applied covering the operative field. Maximum barrier sterile technique with sterile gowns and gloves were used for the procedure. A timeout was performed prior to the initiation of the procedure. Ultrasound survey was performed. Micropuncture kit was utilized to access the right internal jugular vein under direct, real-time ultrasound guidance after the overlying soft tissues were anesthetized with 1% lidocaine with epinephrine. Stab incision was made with 11 blade scalpel. Microwire was passed centrally. The microwire was then marked to measure appropriate internal catheter length. External tunneled length was estimated. A total tip to cuff length of 19 cm was selected. 035  guidewire was advanced to the level of the IVC. Skin and subcutaneous tissues of chest wall below the clavicle were generously infiltrated with 1% lidocaine for  local anesthesia. A small stab incision was made with 11 blade scalpel. The selected hemodialysis catheter was tunneled in a retrograde fashion from the anterior chest wall to the venotomy incision. Serial dilation was performed and then a peel-away sheath was placed. The catheter was then placed through the peel-away sheath with tips ultimately positioned within the superior aspect of the right atrium. Final catheter positioning was confirmed and documented with a spot radiographic image. The catheter aspirates and flushes normally. The catheter was flushed with appropriate volume heparin dwells. The catheter exit site was secured with a 0-Prolene retention suture. Gel-Foam slurry was infused into the soft tissue tract. The venotomy incision was closed Derma bond and sterile dressing. Dressings were applied at the chest wall. Patient tolerated the procedure well and remained hemodynamically stable throughout. No complications were encountered and no significant blood loss encountered. IMPRESSION: Status post right IJ tunneled hemodialysis catheter placement. Catheter ready for use. Signed, Dulcy Fanny. Dellia Nims, RPVI [facility] Electronically Signed   By: Corrie Mckusick D.O.   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 ANESTHESIA/SEDATION: Fentanyl 100 mcg IV; Versed 4 mg IV Sedation time: 14 minutes; The patient was continuously monitored during the procedure by the interventional radiology nurse under my direct supervision. CONTRAST:  None. COMPLICATIONS: SIR Level A - No  therapy, no consequence. Retroperitoneal hemorrhage at site of biopsy, controlled with Gel-Foam slurry administration along needle track. PROCEDURE: Informed consent was obtained from the patient following an explanation of the procedure, risks, benefits and alternatives. A time out was performed prior to the initiation of the procedure. The patient was positioned in a partial left lateral decubitus position on the CT table and a limited CT was performed for procedural planning demonstrating similar appearing multifocal right retroperitoneal lymphadenopathy. The procedure was planned. The operative site was prepped and draped in the usual sterile fashion. Appropriate trajectory was confirmed with a 22 gauge spinal needle after the adjacent tissues were anesthetized with 1% Lidocaine with epinephrine. Under intermittent CT guidance, a 17 gauge coaxial needle was advanced into the peripheral aspect of the mass. Appropriate positioning was confirmed and a total of 4 samples were obtained with an 18 gauge core needle biopsy device. A limited CT demonstrated focal hemorrhage about the biopsied lymph node. Gel-Foam slurry was injected through the introducer needle along the needle track. The co-axial needle was removed and hemostasis was achieved with manual compression. Additional limited postprocedural CT was negative for expanding hemorrhage or additional complication. A dressing was placed. The patient tolerated the procedure well without immediate postprocedural complication. IMPRESSION: Technically successful CT guided core needle biopsy of right retroperitoneal lymph node. Ruthann Cancer, MD [facility] Electronically Signed   By: Ruthann Cancer MD   On: 12/07/2020 08:53   DG CHEST PORT 1 VIEW  Result Date: 12/10/2020 CLINICAL DATA:  Hypoxia.  History of cardiac transplant EXAM: PORTABLE CHEST 1 VIEW COMPARISON:  12/18/2020 FINDINGS: Prior median  sternotomy. Heart size within normal limits. Atherosclerotic calcification of the aortic knob. Subtle right lower lobe opacity, slightly improved from prior. Trace right pleural effusion. No pneumothorax. IMPRESSION: 1. Subtle right lower lobe opacity, slightly improved from prior. 2. Trace right pleural effusion. Electronically Signed   By: Davina Poke D.O.   On: 12/10/2020 10:33  DG C-Arm 1-60 Min-No Report  Result Date: 11/29/2020 Fluoroscopy was utilized by the requesting physician.  No radiographic interpretation.   ECHOCARDIOGRAM COMPLETE  Result Date: 11/30/2020    ECHOCARDIOGRAM REPORT   Patient Name:   Glenn Gonzalez Whittle Date of Exam: 11/30/2020 Medical Rec #:  [id number]         Height:       74.0 in Accession #:    [id number]        Weight:       221.0 lb Date of Birth:  Apr 30, 1961        BSA:          2.268 m Patient Age:    87 years          BP:           103/59 mmHg Patient Gender: M                 HR:           94 bpm. Exam Location:  Inpatient Procedure: 2D Echo, Cardiac Doppler, Color Doppler and Intracardiac            Opacification Agent Indications:    Dyspnea R06.00  History:        Patient has prior history of Echocardiogram examinations, most                 recent 05/12/2017. Arrythmias:Atrial Fibrillation; Risk                 Factors:Non-Smoker. GERD. 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  Sonographer:    Vickie Epley RDCS Referring Phys: [id number] [facility]  Sonographer Comments: Suboptimal apical window and suboptimal subcostal window. Pt unable to turn on side due to herniated discs. IMPRESSIONS  1. Left ventricular ejection fraction, by estimation, is 60 to 65%. The left ventricle has normal function. The left ventricle has no regional wall motion abnormalities. Left ventricular diastolic parameters were normal.  2. Right ventricular systolic function is normal. The right ventricular size is normal. There is normal pulmonary artery systolic pressure. The estimated right  ventricular systolic pressure is 84.6 mmHg.  3. The mitral valve is normal in structure. No evidence of mitral valve regurgitation. No evidence of mitral stenosis.  4. The aortic valve is normal in structure. Aortic valve regurgitation is not visualized. No aortic stenosis is present.  5. The inferior vena cava is normal in size with greater than 50% respiratory variability, suggesting right atrial pressure of 3 mmHg. FINDINGS  Left Ventricle: Left ventricular ejection fraction, by estimation, is 60 to 65%. The left ventricle has normal function. The left ventricle has no regional wall motion abnormalities. Definity contrast agent was given IV to delineate the left ventricular  endocardial borders. The left ventricular internal cavity size was normal in size. There is no left ventricular hypertrophy. Left ventricular diastolic parameters were normal. Normal left ventricular filling pressure. Right Ventricle: The right ventricular size is normal. No increase in right ventricular wall thickness. Right ventricular systolic function is normal. There is normal pulmonary artery systolic pressure. The tricuspid regurgitant velocity is 2.39 m/s, and  with an assumed right atrial pressure of 3 mmHg, the estimated right ventricular systolic pressure is 65.9 mmHg. Left Atrium: Left atrial size was normal in size. Right Atrium: Right atrial size was normal in size. Pericardium: There is no evidence of pericardial effusion. Mitral Valve: The mitral valve is normal in structure. No evidence of mitral valve regurgitation. No evidence of mitral valve stenosis.  Tricuspid Valve: The tricuspid valve is normal in structure. Tricuspid valve regurgitation is mild . No evidence of tricuspid stenosis. Aortic Valve: The aortic valve is normal in structure. Aortic valve regurgitation is not visualized. No aortic stenosis is present. Pulmonic Valve: The pulmonic valve was normal in structure. Pulmonic valve regurgitation is not visualized. No  evidence of pulmonic stenosis. Aorta: The aortic root is normal in size and structure. Venous: The inferior vena cava is normal in size with greater than 50% respiratory variability, suggesting right atrial pressure of 3 mmHg. IAS/Shunts: No atrial level shunt detected by color flow Doppler.  LEFT VENTRICLE PLAX 2D LVIDd:         4.40 cm     Diastology LVIDs:         3.20 cm     LV e' medial:    10.00 cm/s LV PW:         0.90 cm     LV E/e' medial:  7.5 LV IVS:        0.90 cm     LV e' lateral:   11.30 cm/s LVOT diam:     2.50 cm     LV E/e' lateral: 6.7 LV SV:         48 LV SV Index:   21 LVOT Area:     4.91 cm  LV Volumes (MOD) LV vol d, MOD A4C: 80.2 ml LV vol s, MOD A4C: 30.4 ml LV SV MOD A4C:     80.2 ml LEFT ATRIUM           Index LA diam:      2.70 cm 1.19 cm/m LA Vol (A4C): 16.3 ml 7.19 ml/m  AORTIC VALVE LVOT Vmax:   50.20 cm/s LVOT Vmean:  36.700 cm/s LVOT VTI:    0.099 m  AORTA Ao Root diam: 3.40 cm MITRAL VALVE               TRICUSPID VALVE MV Area (PHT): 5.38 cm    TR Peak grad:   22.8 mmHg MV Decel Time: 141 msec    TR Vmax:        239.00 cm/s MV E velocity: 75.40 cm/s MV A velocity: 36.00 cm/s  SHUNTS MV E/A ratio:  2.09        Systemic VTI:  0.10 m                            Systemic Diam: 2.50 cm Dani Gobble Croitoru MD Electronically signed by Sanda Klein MD Signature Date/Time: 11/30/2020/12:30:40 PM    Final    VAS [place] LOWER EXTREMITY VENOUS (DVT)  Result Date: 12/10/2020  Lower Venous DVT Study Indications: Pain.  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 Comparison Study: Previous 12/02/20 Negative Performing Technologist: Vonzell Schlatter RVT  Examination Guidelines: A complete evaluation includes B-mode imaging, spectral Doppler, color Doppler, and power Doppler as needed of all accessible portions of each vessel. Bilateral testing is considered an integral part of a complete examination. Limited examinations for reoccurring indications may be  performed as noted. The reflux portion of the exam is performed with the patient in reverse Trendelenburg.  +---------+---------------+---------+-----------+----------+--------------+ RIGHT    CompressibilityPhasicitySpontaneityPropertiesThrombus Aging +---------+---------------+---------+-----------+----------+--------------+ CFV      Full           Yes      Yes                                 +---------+---------------+---------+-----------+----------+--------------+  SFJ      Full                                                        +---------+---------------+---------+-----------+----------+--------------+ FV Prox  Full                                                        +---------+---------------+---------+-----------+----------+--------------+ FV Mid   Full                                                        +---------+---------------+---------+-----------+----------+--------------+ FV DistalFull                                                        +---------+---------------+---------+-----------+----------+--------------+ PFV      Full                                                        +---------+---------------+---------+-----------+----------+--------------+ POP      Full           Yes      Yes                                 +---------+---------------+---------+-----------+----------+--------------+ PTV      None                                                        +---------+---------------+---------+-----------+----------+--------------+ PERO     None                                                        +---------+---------------+---------+-----------+----------+--------------+   +---------+---------------+---------+-----------+----------+--------------+ LEFT     CompressibilityPhasicitySpontaneityPropertiesThrombus Aging +---------+---------------+---------+-----------+----------+--------------+ CFV      Full            Yes      Yes                                 +---------+---------------+---------+-----------+----------+--------------+ SFJ      Full                                                        +---------+---------------+---------+-----------+----------+--------------+  FV Prox  Full                                                        +---------+---------------+---------+-----------+----------+--------------+ FV Mid   Full                                                        +---------+---------------+---------+-----------+----------+--------------+ FV DistalFull                                                        +---------+---------------+---------+-----------+----------+--------------+ PFV      Full                                                        +---------+---------------+---------+-----------+----------+--------------+ POP      Full           Yes      Yes                                 +---------+---------------+---------+-----------+----------+--------------+ PTV      Full                                                        +---------+---------------+---------+-----------+----------+--------------+ PERO     Full                                                        +---------+---------------+---------+-----------+----------+--------------+     Summary: RIGHT: - Findings consistent with acute deep vein thrombosis involving one right posterior tibial vein, and both right peroneal veins. - No cystic structure found in the popliteal fossa.  LEFT: - There is no evidence of deep vein thrombosis in the lower extremity.  - No cystic structure found in the popliteal fossa.  *See table(s) above for measurements and observations. Electronically signed by Jamelle Haring on 12/10/2020 at 7:16:00 PM.    Final    VAS [place] LOWER EXTREMITY VENOUS (DVT)  Result Date: 12/03/2020  Lower Venous DVT Study Indications: Pain.  Comparison Study:  Previous 12/2019 Performing Technologist: Vonzell Schlatter RVT  Examination Guidelines: A complete evaluation includes B-mode imaging, spectral Doppler, color Doppler, and power Doppler as needed of all accessible portions of each vessel. Bilateral testing is considered an integral part of a complete examination. Limited examinations for reoccurring indications may be performed as noted. The reflux portion of the exam is performed with the patient in reverse Trendelenburg.  +---------+---------------+---------+-----------+----------+--------------+ RIGHT  CompressibilityPhasicitySpontaneityPropertiesThrombus Aging +---------+---------------+---------+-----------+----------+--------------+ CFV      Full           Yes      Yes                                 +---------+---------------+---------+-----------+----------+--------------+ SFJ      Full                                                        +---------+---------------+---------+-----------+----------+--------------+ FV Prox  Full                                                        +---------+---------------+---------+-----------+----------+--------------+ FV Mid   Full                                                        +---------+---------------+---------+-----------+----------+--------------+ FV DistalFull                                                        +---------+---------------+---------+-----------+----------+--------------+ PFV      Full                                                        +---------+---------------+---------+-----------+----------+--------------+ POP      Full           Yes      Yes                                 +---------+---------------+---------+-----------+----------+--------------+ PTV      Full                                                        +---------+---------------+---------+-----------+----------+--------------+ PERO     Full                                                         +---------+---------------+---------+-----------+----------+--------------+   +---------+---------------+---------+-----------+----------+--------------+ LEFT     CompressibilityPhasicitySpontaneityPropertiesThrombus Aging +---------+---------------+---------+-----------+----------+--------------+ CFV      Full           Yes      Yes                                 +---------+---------------+---------+-----------+----------+--------------+  SFJ      Full                                                        +---------+---------------+---------+-----------+----------+--------------+ FV Prox  Full                                                        +---------+---------------+---------+-----------+----------+--------------+ FV Mid   Full                                                        +---------+---------------+---------+-----------+----------+--------------+ FV DistalFull                                                        +---------+---------------+---------+-----------+----------+--------------+ PFV      Full                                                        +---------+---------------+---------+-----------+----------+--------------+ POP      Full           Yes      Yes                                 +---------+---------------+---------+-----------+----------+--------------+ PTV      Full                                                        +---------+---------------+---------+-----------+----------+--------------+ PERO     Full                                                        +---------+---------------+---------+-----------+----------+--------------+     Summary: RIGHT: - There is no evidence of deep vein thrombosis in the lower extremity.  - No cystic structure found in the popliteal fossa.  LEFT: - There is no evidence of deep vein thrombosis in the lower extremity.  - No  cystic structure found in the popliteal fossa.  *See table(s) above for measurements and observations. Electronically signed by Ruta Hinds MD on 12/03/2020 at 12:15:41 PM.    Final    ECHOCARDIOGRAM LIMITED  Result Date: 12/10/2020    ECHOCARDIOGRAM LIMITED REPORT   Patient Name:   Glenn Gonzalez Date of Exam: 12/10/2020 Medical Rec #:  [id number]  Height:       74.0 in Accession #:    [id number]        Weight:       230.4 lb Date of Birth:  04-15-1961        BSA:          2.308 m Patient Age:    21 years          BP:           105/72 mmHg Patient Gender: M                 HR:           99 bpm. Exam Location:  Inpatient Procedure: Limited Echo, Cardiac Doppler and Color Doppler Indications:    Acute DVT  History:        Patient has prior history of Echocardiogram examinations, most                 recent 11/30/2020. Arrythmias:Atrial Fibrillation. CKD. GERD.                 S/P heart transplant 01/22/16.  Sonographer:    Clayton Lefort RDCS (AE) Referring Phys: [id number] [facility]  Sonographer Comments: Technically difficult study due to poor echo windows, suboptimal apical window and suboptimal subcostal window. Unable to roll patient due to herniated discs. IMPRESSIONS  1. S/P Heart Transplant. Left ventricular ejection fraction, by estimation, is 60 to 65%. The left ventricle has normal function. Left ventricular endocardial border not optimally defined to evaluate regional wall motion. There is moderate concentric left ventricular hypertrophy. Left ventricular diastolic function could not be evaluated.  2. Right ventricular systolic function is moderately reduced. The right ventricular size is mildly enlarged. Tricuspid regurgitation signal is inadequate for assessing PA pressure.  3. A small pericardial effusion is present. The pericardial effusion is surrounding the apex.  4. Tricuspid valve regurgitation is mild to moderate. FINDINGS  Left Ventricle: S/P Heart Transplant. Left ventricular  ejection fraction, by estimation, is 60 to 65%. The left ventricle has normal function. Left ventricular endocardial border not optimally defined to evaluate regional wall motion. There is moderate concentric left ventricular hypertrophy. Left ventricular diastolic function could not be evaluated. Right Ventricle: The right ventricular size is mildly enlarged. Right ventricular systolic function is moderately reduced. Tricuspid regurgitation signal is inadequate for assessing PA pressure. Pericardium: A small pericardial effusion is present. The pericardial effusion is surrounding the apex. Tricuspid Valve: Tricuspid valve regurgitation is mild to moderate. Venous: The inferior vena cava was not well visualized. 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  Molecular studies pending. -12/22/2020 CT chest/abdomen/pelvis without contrast showed "1. Retroperitoneal adenopathy and perinephric nodularity with dilation of the RIGHT renal pelvis and collecting systems, filled with what is suspected to represent neoplasm. 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 Differential considerations given heart transplantation and presumed immunosuppression would include posttransplant lymphoproliferative disorder/lymphoma. 2. Dense  material in collecting systems may represent a mixture of tumor and or blood and there is profound dilation of collecting systems of the RIGHT kidney. This is unchanged compared to the recent comparison study. 3. Signs of hepatic involvement with new lesions in the RIGHT hepatic lobe. 4. Ascites with small amount of ascites with very subtle infiltration of fat in the omentum not mentioned above, for instance on image 50 of series 2 raising the question of peritoneal involvement as well. This area is in the range of 2-3 mm. 5. Small RIGHT pleural effusion and basilar airspace disease. 6. Postoperative changes about the chest likely related to prior heart transplantation with surgical clips in the RIGHT axilla and thickening and calcification along the anterior pectoralis musculature not changed since 2018. 7. Calcification in the LEFT atrium also about the aortic root and pulmonary artery and to a lesser degree in the RIGHT atrium likely reflects changes of vascular anastomotic sites in the setting of heart transplantation. 8. 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 9. Post splenectomy. 10. Aortic atherosclerosis. 11. Small nodule in the LEFT upper lobe 6 mm airways are patent."  2) Back pain secondary to #1  3) Hyponatremia  4) Leukocytosis and Thrombocytosis  5) CKD  6) High-grade T1 bladder cancer  7) History of Hodgkin's lymphoma  8) status post heart transplant  PLAN: -The patient was started on Padcev day 1 of cycle #1 on 12/14/2020.  He is due for day 8 of cycle #1 on 12/21/2020.  I have communicated with the cancer center pharmacy and the [facility] inpatient chemotherapy team to make arrangements for chemotherapy administration on Friday of this week. -We will follow-up on molecular studies and PD-L1 testing. -Request for second opinion at [facility] has been sent.  Awaiting appointment to be arranged. -He  is aware that this is an incurable condition. -Continue hemodialysis per nephrology. -Recommend ongoing care by the palliative care team to assist with goals of care discussion and pain management. -Continue IV heparin for lower extremity DVT which was likely triggered by malignancy and immobility.  If renal function stabilizes, can transition to Lovenox 1 mg/kg once daily and check Xa levels to adjust dose.   LOS: 11 days   Mikey Bussing, DNP, AGPCNP-BC, AOCNP 12/18/20  Addendum  .Patient was Personally and independently interviewed, examined and relevant elements of the history of present illness were reviewed in details and an assessment and plan was created. All elements of the patient's history of present illness , assessment and plan were discussed in details with Mikey Bussing, DNP, AGPCNP-BC, AOCNP. The above documentation reflects our combined findings assessment and plan.  Cardiology involved in the setting of renal failure to reflect on tacrolimus mx. Though tacrolimus is predominantly hepatically cleared it can cause/add to renal issues and can add to renal toxicities in the setting of other renal insults. Cardiology recommended continuing current dose of tac and decreasing steroids. Will defer to hospital medicine and pharmacy to evaluate tacrolimus dosing per cardiology recommendations.  Reduced Dexamethasone to 63m po daily with breakfast (from 471mpo BID-- started by Dr KaSonny Dandyor ?neoplasm related pain assume possible lymphoma). Okay to wean down to his baseline immunosuppressant dose of Prednisone 76m3mo daily.  Patient will be due for C1D8 Padcev on 12/21/2020 if still inpatient.  Appreciate hospital medicine, nephrology and cardiology cares.  GauSullivan Lone MS

## 2020-12-18 NOTE — Progress Notes (Signed)
ANTICOAGULATION CONSULT NOTE - Follow Up Consult  Pharmacy Consult for heparin Indication: DVT  Allergies  Allergen Reactions  . Digoxin Other (See Comments)    gynecomastia   . Phencyclidine Other (See Comments)    PCP derived antibiotic > Insomnia  . Spironolactone Other (See Comments)    Gynecomastia  . Oxybutynin Chloride     Other reaction(s): low BP  . Elemental Sulfur Rash  . Lactose Intolerance (Gi) Diarrhea and Other (See Comments)    cramps  . Penicillins Other (See Comments)    Cramps in stomach Did it involve swelling of the face/tongue/throat, SOB, or low BP? No Did it involve sudden or severe rash/hives, skin peeling, or any reaction on the inside of your mouth or nose? No Did you need to seek medical attention at a hospital or doctor's office? No When did it last happen?unknown If all above answers are "NO", may proceed with cephalosporin use.       Patient Measurements: Height: 6\' 2"  (188 cm) Weight: 104.7 kg (230 lb 13.2 oz) IBW/kg (Calculated) : 82.2 Heparin Dosing Weight: 104 kg  Vital Signs: Temp: 98.1 F (36.7 C) (01/18 1752) Temp Source: Oral (01/18 1752) BP: 109/68 (01/18 1752) Pulse Rate: 86 (01/18 1752)  Labs: Recent Labs    12/16/20 0857 12/16/20 1942 12/17/20 0319 12/17/20 1017 12/17/20 1800 12/18/20 0409 12/18/20 1703  HGB 14.3  --  14.5  --   --  13.9  --   HCT 44.1  --  43.8  --   --  41.8  --   PLT 472*  --  443*  --   --  360  --   HEPARINUNFRC 0.87*   < >  --    < > 0.36 0.16* 0.16*  CREATININE 4.68*  --  5.26*  --   --  4.56*  --    < > = values in this interval not displayed.    Estimated Creatinine Clearance: 22.5 mL/min (A) (by C-G formula based on SCr of 4.56 mg/dL (H)).   Medications:  Scheduled:  . allopurinol  200 mg Oral Daily  . artificial tears  1 application Both Eyes QHS  . azelastine  1 spray Each Nare Daily   And  . fluticasone  1 spray Each Nare Daily  . cetirizine  10 mg Oral QHS  .  Chlorhexidine Gluconate Cloth  6 each Topical Daily  . Chlorhexidine Gluconate Cloth  6 each Topical Q0600  . dexamethasone  4 mg Oral BID WC  . fentaNYL  1 patch Transdermal Q72H  . lactulose  10 g Oral BID  . levothyroxine  125 mcg Oral Daily  . metoCLOPramide  5 mg Oral TID AC & HS  . polyvinyl alcohol  1 drop Both Eyes TID  . senna-docusate  3 tablet Oral BID  . sodium zirconium cyclosilicate  10 g Oral BID  . Tacrolimus ER  1.5 mg Oral Daily  . traZODone  100 mg Oral QHS   Infusions:  . heparin 1,100 Units/hr (12/18/20 1655)    Assessment: 60 yo M continues on heparin for acute DVT.   - 1/6: IR guided right retroperitoneal lymph node biopsy. Retroperitoneal hemorrhage noted on post-biopsy CT - 1/10: LE doppler- acute DVT   Heparin was previously therapeutic on heparin 1100 units/hr. Pt went to dialysis yesterday. Heparin level this evening is SUBtherapeutic at 0.16.  No bleeding noted.   Of note: Patient to receive HD again today.  Goal of Therapy:  Heparin  level 0.3-0.7 units/ml Monitor platelets by anticoagulation protocol: Yes   Plan:  Increase heparin to 1400 units/hr. Check anti-Xa level with am labs and daily while on heparin Continue to monitor H&H and platelets  Thank you for allowing [place] to participate in this patients care.   Alanda Slim, PharmD, [facility] Clinical Pharmacist Please see AMION for all Pharmacists' Contact Phone Numbers 12/18/2020, 6:11 PM

## 2020-12-18 NOTE — Progress Notes (Addendum)
[facility]                                                                              Patient Demographics  Glenn Gonzalez, is a 60 y.o. male, DOB - 1961/02/23, RDE:[id number]  Admit date - 12/04/2020   Admitting Physician Desiree Hane, MD  Outpatient Primary MD for the patient is Lajean Manes, MD  Outpatient specialists:   LOS - 11  days   Medical records reviewed and are as summarized below:    No chief complaint on file.      Brief summary  Glenn Balis Duehringis a 60 y.o.year old malewith medical history significant for Hodgkin lymphoma, cardiac transplant on tacrolimus and prednisone, chronic combined systolic/diastolic CHF, GERD, high-grade T1 bladder cancer with recent hospitalization from 63/14-9/7 for AKI complicated by hydronephrosis in the setting of new retroperitoneal lymphadenopathy. Patient underwent stent placement by urology and was treated for postoperative multifocal pneumonia.  He presented from urology office with reports of increased back pain not well controlled on oral pain medication as well as generalized fatigue, poor appetite. He was brought in under observation due to concern for back pain in the setting of known retroperitoneal lymphadenopathy.  Hospital course complicated by acute hyponatremia in the setting of poor appetite requiring IV fluids and persistent leukocytosis. CT abdomen imaging consistent with retroperitoneal adenopathy and dilation of right renal pelvis and collecting systems concerning for urothelial neoplasm with extensive metastatic process. Found to have acute right lower extremity DVT on venous duplex started on heparin on 1/10. Has opted for dialysis while awaiting [facility] response.  1/17: Tunneled dialysis cath placed by IR, initiated hemodialysis, first 1/17, tolerated 1/18: second hemodialysis planned  Assessment & Plan    Principal Problem: Retroperitoneal adenopathy and perinephric nodularity,  high-grade bladder CA, recently diagnosed -Status post IR guided retroperitoneal biopsy, pathology positive for poorly differentiated carcinoma.  Recent hospitalization 02/63-7/8 for AKI complicated by hydronephrosis in the setting of new retroperitoneal LAD, underwent stent placement. -Urology and oncology following. -Oncology consulted, sent for molecular testing and hoping to discuss with [facility] oncology regarding patient's options --Startedon palliative chemo 1/14, palliative medicine following  AKI on CKD stage 3a, now dialysis dependent, new -Baseline creatinine 1.2-1.4 from 08/2020, 1.8 from last hospitalization -Suspect lymphatic obstruction due to malignancy, low albumin, renal function worsening over the last few weeks.  Nephrology was consulted. - Lasix was added for volume overload though not effective.  2D echo showed normal EF - 1/17: IR consulted and tunneled HD cath placed, initiated on HD, tolerated -1/18: HD planned today  Hyperkalemia - Placed on Lokelma twice daily, will improve with HD  Acute right lower extremity DVT -Continue IV heparin, H&H stable  Small retroperitoneal hemorrhage status post biopsy -H&H stable  Acute on chronic hyponatremia -Initially improved with IV fluid and thought to be due to diminished p.o. intake but then patient was volume overloaded -Started on hemodialysis on 1/17, Na improving  Small pericardial effusion -Incidentally noted on echocardiogram, without tamponade physiology  Chronic combined systolic and diastolic heart failure, history of cardiac transplant -Patient is status post heart transplant, prior EF 10% - EF has now  corrected to 60 to 65% -Volume control with HD, initiated on 1/17  History of cardiac transplant -Continue tacrolimus -Continue dexamethasone  -Per Dr. Kennon Rounds, attempted unsuccessfully to get in touch with Dr. Eleanora Neighbor team at [facility] to discuss his cardiac transplant medications per patient's request.   -Cardiology consulted Addendum: 4:00pm D/w Dr Haroldine Laws, EF is normal, recommended to continue the immunosuppressive regimen and follow the tacrolimus levels. Dr. Haroldine Laws has notified/updated the transplant team at [facility], nothing else to add from cardiology standpoint.    Hypothyroidism -Continue Synthroid  Code Status: DNR DVT Prophylaxis: IV heparin Family Communication: Discussed all imaging results, lab results, explained to the patient.  Called patient's wife, Glenn Gonzalez, unable to contact her, left a detailed voicemail message  Disposition Plan:     Status is: Inpatient  Remains inpatient appropriate because:Inpatient level of care appropriate due to severity of illness   Dispo: The patient is from: Home              Anticipated d/c is to: Home              Anticipated d/c date is: > 3 days              Patient currently is not medically stable to d/c. Initiated on HD, has fluid overload, multiple electrolyte abnormalities, not stable for discharge.      Time Spent in minutes   35 minutes  Procedures:  HD cath 1.17 IR guided biopsy 1/06  Consultants:   Nephrology Oncology Palliative IR  Antimicrobials:   Anti-infectives (From admission, onward)   Start     Dose/Rate Route Frequency Ordered Stop   12/17/20 0830  vancomycin (VANCOCIN) IVPB 1000 mg/200 mL premix        1,000 mg 200 mL/hr over 60 Minutes Intravenous To Radiology 12/17/20 0744 12/17/20 0932   12/16/2020 2200  cefdinir (OMNICEF) capsule 300 mg        300 mg Oral 2 times daily 12/19/2020 1716 12/10/20 2159         Medications  Scheduled Meds: . allopurinol  200 mg Oral Daily  . artificial tears  1 application Both Eyes QHS  . azelastine  1 spray Each Nare Daily   And  . fluticasone  1 spray Each Nare Daily  . cetirizine  10 mg Oral QHS  . Chlorhexidine Gluconate Cloth  6 each Topical Daily  . Chlorhexidine Gluconate Cloth  6 each Topical Q0600  . dexamethasone  4 mg Oral BID WC   . fentaNYL  2 patch Transdermal Q72H  . fentaNYL  1 patch Transdermal Q72H  . lactulose  10 g Oral BID  . levothyroxine  125 mcg Oral Daily  . metoCLOPramide  5 mg Oral TID AC & HS  . polyvinyl alcohol  1 drop Both Eyes TID  . senna-docusate  3 tablet Oral BID  . sodium zirconium cyclosilicate  10 g Oral BID  . Tacrolimus ER  1.5 mg Oral Daily  . traZODone  100 mg Oral QHS   Continuous Infusions: . heparin 1,100 Units/hr (12/18/20 0844)   PRN Meds:.acetaminophen **OR** acetaminophen, bisacodyl, HYDROmorphone (DILAUDID) injection, magnesium hydroxide, ondansetron **OR** ondansetron (ZOFRAN) IV, pantoprazole, prochlorperazine      Subjective:   Azarius Lambson was seen and examined today.  Appears very deconditioned, tolerated hemodialysis on 1/17.  No acute nausea vomiting or chest pain.  No fevers.  Objective:   Vitals:   12/17/20 2007 12/18/20 0441 12/18/20 0445 12/18/20 0930  BP: 95/65 101/67  91/63  Pulse: 85 86  86  Resp: '18 18  17  ' Temp: 98.2 F (36.8 C) 98.2 F (36.8 C)  97.9 F (36.6 C)  TempSrc: Oral Oral  Oral  SpO2: 93% (!) 89%  91%  Weight:   104.7 kg   Height:        Intake/Output Summary (Last 24 hours) at 12/18/2020 1249 Last data filed at 12/18/2020 0800 Gross per 24 hour  Intake 747.03 ml  Output 610 ml  Net 137.03 ml     Wt Readings from Last 3 Encounters:  12/18/20 104.7 kg  11/29/20 100.2 kg  08/31/20 101.2 kg   Physical Exam  General: Alert and oriented x 3, NAD, very weak and deconditioned  Cardiovascular: S1 S2 clear, RRR. No pedal edema b/l  Respiratory: CTAB, no wheezing, rales or rhonchi  Gastrointestinal: Soft, nontender, distended, NBS  Ext: 2+ pitting edema bilaterally  Neuro: no new deficits  Musculoskeletal: No cyanosis, clubbing  Skin: No rashes  Psych: Normal affect and demeanor, alert and oriented x3    Data Reviewed:  I have personally reviewed following labs and imaging studies  Micro Results No results  found for this or any previous visit (from the past 240 hour(s)).  Radiology Reports CT ABDOMEN PELVIS WO CONTRAST  Result Date: 12/13/2020 CLINICAL DATA:  Cancer of unknown primary in this 60 year old male. EXAM: CT CHEST, ABDOMEN AND PELVIS WITHOUT CONTRAST TECHNIQUE: Multidetector CT imaging of the chest, abdomen and pelvis was performed following the standard protocol without IV contrast. COMPARISON:  CT abdomen and pelvis of the same date. Also with CT of December 30th of 2021 FINDINGS: CT CHEST FINDINGS Cardiovascular: Calcified atheromatous plaque in the thoracic aorta. Heart size is normal following sternotomy for heart transplant by report a [facility] medical center. Three-vessel coronary artery calcification. No pericardial effusion. Calcifications in the LEFT atrium partially seen on previous imaging are of uncertain significance central pulmonary vasculature also with some signs of calcification. Mediastinum/Nodes: Ovoid structure associated with LEFT thyroid 11 mm similar to previous imaging, cervical spine from 2017. Thyroid as described above. Calcifications in the LEFT supraclavicular region may be within venous structures on the LEFT. No axillary lymphadenopathy. Surgical clips in the RIGHT axilla and soft tissue thickening over the LEFT pectoralis musculature similar grossly compared to remote MRI evaluation 2018 and surgical clips seen on numerous priors in the RIGHT axilla. No mediastinal lymphadenopathy. No gross hilar lymphadenopathy. Lungs/Pleura: Small RIGHT of pleural effusion and basilar airspace disease. Small nodule in the superior aspect of the RIGHT middle lobe approximately 3 mm on image 57 of series 5. Biapical scarring. Small nodule in the LEFT upper lobe 6 mm (image 51, series 5) airways are patent. Musculoskeletal: Evidence ir sternotomy without significant bony bridging but with sclerotic margins along the sternotomy line similar to studies dating back to August of 2021  with respect to visualized portions on prior abdomen studies no destructive bone finding or acute bone process. CT ABDOMEN PELVIS FINDINGS Hepatobiliary: 1.9 cm lesion in the RIGHT hepatic lobe near the dome of the RIGHT hemi liver does not display attenuation values of simple cyst is unchanged from the study acquired on the same date at another facility. Additionally a 1.4 cm lesion in the RIGHT hepatic lobe on image 23 of series 2 is unchanged. Sludge in the gallbladder. Tiny low-density foci in the LEFT hepatic lobe without change 1 on image 19 of series 2 in the lateral segment measuring 5 mm and another in the  medial segment of the LEFT hepatic lobe measuring 12 mm on image 21 of series 2 these areas appears similar dating back to August of 2020, lesions in the RIGHT hepatic lobe are new. Pancreas: Normal pancreatic contour without signs of inflammation. Spleen: Post splenectomy. Adrenals/Urinary Tract: Adrenal glands, normal on the LEFT and obscured on the RIGHT by perinephric and Peri adrenal nodularity. Fullness of the RIGHT renal pelvis with nephroureteral stent in place showing soft tissue density with distension of the renal pelvis and calices similar to the study acquired on the same date. Nephrolithiasis also unchanged. Perinephric nodularity along the medial upper pole the LEFT kidney measures 6.4 x 2.7 cm which is within 1-2 mm of its previous measurement when measured by this observer on the prior study. Increased in size when compared to the study of November 30, 2020. Renal cortical scarring on the LEFT with nephrolithiasis in the lower pole unchanged from very recent comparison. Urinary bladder with small amount of gas in the urinary bladder with distal aspect of the stent in place, loop coiled in the urinary bladder. Stomach/Bowel: Scattered colonic diverticulosis without acute gastrointestinal process. Normal appendix. Stool and contrast throughout the colon. Vascular/Lymphatic: Calcified  atheromatous plaque in the abdominal aorta without aneurysmal dilation. Retroperitoneal adenopathy unchanged from scan earlier the same day, largest lymph nodes behind the inferior vena cava and in the intra-aortocaval groove and in the upper abdomen, largest on image 41 of series 2 measuring 1.9 cm. Smaller lymph nodes along the LEFT periaortic chain. Postoperative changes in the retroperitoneum associated with previous lymphadenectomy No pelvic lymphadenopathy. Reproductive: Prostate with uro lift device deployment bilaterally similar to recent priors. Other: Extensive retroperitoneal fascial thickening/stranding extending into the root of the small bowel mesentery, nodular changes associated with this thickening on the RIGHT outlining the renal fascia this crosses the midline where it is without significant nodularity but with fascial thickening and stranding, also tracking into the pelvis. Small amount of fluid in the pelvis. Musculoskeletal: Spinal degenerative changes. No destructive bone process or acute bone finding. IMPRESSION: 1. Retroperitoneal adenopathy and perinephric nodularity with dilation of the RIGHT renal pelvis and collecting systems, filled with what is suspected to represent neoplasm. 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 Differential considerations given heart transplantation and presumed immunosuppression would include posttransplant lymphoproliferative disorder/lymphoma. 2. Dense material in collecting systems may represent a mixture of tumor and or blood and there is profound dilation of collecting systems of the RIGHT kidney. This is unchanged compared to the recent comparison study. 3. Signs of hepatic involvement with new lesions in the RIGHT hepatic lobe. 4. Ascites with small amount of ascites with very subtle infiltration of fat in the omentum not mentioned above, for instance on  image 50 of series 2 raising the question of peritoneal involvement as well. This area is in the range of 2-3 mm. 5. Small RIGHT pleural effusion and basilar airspace disease. 6. Postoperative changes about the chest likely related to prior heart transplantation with surgical clips in the RIGHT axilla and thickening and calcification along the anterior pectoralis musculature not changed since 2018. 7. Calcification in the LEFT atrium also about the aortic root and pulmonary artery and to a lesser degree in the RIGHT atrium likely reflects changes of vascular anastomotic sites in the setting of heart transplantation. 8. 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 9. Post splenectomy. 10.  Aortic atherosclerosis. 11. Small nodule in the LEFT upper lobe 6 mm airways are patent. These results will be called to the ordering clinician or representative by the Radiologist Assistant, and communication documented in the PACS or [employer]. Aortic Atherosclerosis (ICD10-I70.0). Electronically Signed   By: Zetta Bills M.D.   On: 12/22/2020 17:38   DG Chest 1 View  Result Date: 11/29/2020 CLINICAL DATA:  Hypoxia EXAM: CHEST  1 VIEW COMPARISON:  08/03/2020 FINDINGS: Since the prior examination, there has developed right basilar consolidation, possibly infectious in the appropriate clinical setting. Mild focal infiltrate is also noted within the right apex. Left lung is clear. No pneumothorax or pleural effusion. Median sternotomy has been performed. Cardiac size within normal limits. Multiple healed right rib fractures are noted. Multiple surgical clips are seen within the right axilla. IMPRESSION: Interval development of multifocal pulmonary infiltrate within the right lung, more focal within the right lung base, suspicious for atypical infection in the acute setting. Electronically Signed   By: Fidela Salisbury MD   On: 11/29/2020 22:27    DG Chest 2 View  Result Date: 12/24/2020 CLINICAL DATA:  Bladder cancer.  Follow-up multifocal pneumonia. EXAM: CHEST - 2 VIEW COMPARISON:  11/29/2020 FINDINGS: Prior median sternotomy. Heart is normal size. Patchy right lung airspace disease in the right apex and right lower lung. Minimal left base linear atelectasis or scarring. Overall aeration in the right lung slightly improved. No effusions. IMPRESSION: Patchy right lung airspace disease with slight improvement since prior study. Left base atelectasis or scarring. Electronically Signed   By: Rolm Baptise M.D.   On: 12/25/2020 13:25   CT CHEST WO CONTRAST  Result Date: 12/26/2020 CLINICAL DATA:  Cancer of unknown primary in this 60 year old male. EXAM: CT CHEST, ABDOMEN AND PELVIS WITHOUT CONTRAST TECHNIQUE: Multidetector CT imaging of the chest, abdomen and pelvis was performed following the standard protocol without IV contrast. COMPARISON:  CT abdomen and pelvis of the same date. Also with CT of December 30th of 2021 FINDINGS: CT CHEST FINDINGS Cardiovascular: Calcified atheromatous plaque in the thoracic aorta. Heart size is normal following sternotomy for heart transplant by report a [facility] [facility] medical center. Three-vessel coronary artery calcification. No pericardial effusion. Calcifications in the LEFT atrium partially seen on previous imaging are of uncertain significance central pulmonary vasculature also with some signs of calcification. Mediastinum/Nodes: Ovoid structure associated with LEFT thyroid 11 mm similar to previous imaging, cervical spine from 2017. Thyroid as described above. Calcifications in the LEFT supraclavicular region may be within venous structures on the LEFT. No axillary lymphadenopathy. Surgical clips in the RIGHT axilla and soft tissue thickening over the LEFT pectoralis musculature similar grossly compared to remote MRI evaluation 2018 and surgical clips seen on numerous priors in the RIGHT axilla. No mediastinal  lymphadenopathy. No gross hilar lymphadenopathy. Lungs/Pleura: Small RIGHT of pleural effusion and basilar airspace disease. Small nodule in the superior aspect of the RIGHT middle lobe approximately 3 mm on image 57 of series 5. Biapical scarring. Small nodule in the LEFT upper lobe 6 mm (image 51, series 5) airways are patent. Musculoskeletal: Evidence ir sternotomy without significant bony bridging but with sclerotic margins along the sternotomy line similar to studies dating back to August of 2021 with respect to visualized portions on prior abdomen studies no destructive bone finding or acute bone process. CT ABDOMEN PELVIS FINDINGS Hepatobiliary: 1.9 cm lesion in the RIGHT hepatic lobe near the dome of the RIGHT hemi liver does not display attenuation values of simple  cyst is unchanged from the study acquired on the same date at another facility. Additionally a 1.4 cm lesion in the RIGHT hepatic lobe on image 23 of series 2 is unchanged. Sludge in the gallbladder. Tiny low-density foci in the LEFT hepatic lobe without change 1 on image 19 of series 2 in the lateral segment measuring 5 mm and another in the medial segment of the LEFT hepatic lobe measuring 12 mm on image 21 of series 2 these areas appears similar dating back to August of 2020, lesions in the RIGHT hepatic lobe are new. Pancreas: Normal pancreatic contour without signs of inflammation. Spleen: Post splenectomy. Adrenals/Urinary Tract: Adrenal glands, normal on the LEFT and obscured on the RIGHT by perinephric and Peri adrenal nodularity. Fullness of the RIGHT renal pelvis with nephroureteral stent in place showing soft tissue density with distension of the renal pelvis and calices similar to the study acquired on the same date. Nephrolithiasis also unchanged. Perinephric nodularity along the medial upper pole the LEFT kidney measures 6.4 x 2.7 cm which is within 1-2 mm of its previous measurement when measured by this observer on the prior  study. Increased in size when compared to the study of November 30, 2020. Renal cortical scarring on the LEFT with nephrolithiasis in the lower pole unchanged from very recent comparison. Urinary bladder with small amount of gas in the urinary bladder with distal aspect of the stent in place, loop coiled in the urinary bladder. Stomach/Bowel: Scattered colonic diverticulosis without acute gastrointestinal process. Normal appendix. Stool and contrast throughout the colon. Vascular/Lymphatic: Calcified atheromatous plaque in the abdominal aorta without aneurysmal dilation. Retroperitoneal adenopathy unchanged from scan earlier the same day, largest lymph nodes behind the inferior vena cava and in the intra-aortocaval groove and in the upper abdomen, largest on image 41 of series 2 measuring 1.9 cm. Smaller lymph nodes along the LEFT periaortic chain. Postoperative changes in the retroperitoneum associated with previous lymphadenectomy No pelvic lymphadenopathy. Reproductive: Prostate with uro lift device deployment bilaterally similar to recent priors. Other: Extensive retroperitoneal fascial thickening/stranding extending into the root of the small bowel mesentery, nodular changes associated with this thickening on the RIGHT outlining the renal fascia this crosses the midline where it is without significant nodularity but with fascial thickening and stranding, also tracking into the pelvis. Small amount of fluid in the pelvis. Musculoskeletal: Spinal degenerative changes. No destructive bone process or acute bone finding. IMPRESSION: 1. Retroperitoneal adenopathy and perinephric nodularity with dilation of the RIGHT renal pelvis and collecting systems, filled with what is suspected to represent neoplasm. 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 Differential considerations given heart transplantation and presumed  immunosuppression would include posttransplant lymphoproliferative disorder/lymphoma. 2. Dense material in collecting systems may represent a mixture of tumor and or blood and there is profound dilation of collecting systems of the RIGHT kidney. This is unchanged compared to the recent comparison study. 3. Signs of hepatic involvement with new lesions in the RIGHT hepatic lobe. 4. Ascites with small amount of ascites with very subtle infiltration of fat in the omentum not mentioned above, for instance on image 50 of series 2 raising the question of peritoneal involvement as well. This area is in the range of 2-3 mm. 5. Small RIGHT pleural effusion and basilar airspace disease. 6. Postoperative changes about the chest likely related to prior heart transplantation with surgical clips in the RIGHT axilla and thickening and calcification along the anterior pectoralis  musculature not changed since 2018. 7. Calcification in the LEFT atrium also about the aortic root and pulmonary artery and to a lesser degree in the RIGHT atrium likely reflects changes of vascular anastomotic sites in the setting of heart transplantation. 8. 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 9. Post splenectomy. 10. Aortic atherosclerosis. 11. Small nodule in the LEFT upper lobe 6 mm airways are patent. These results will be called to the ordering clinician or representative by the Radiologist Assistant, and communication documented in the PACS or [employer]. Aortic Atherosclerosis (ICD10-I70.0). Electronically Signed   By: Zetta Bills M.D.   On: 12/14/2020 17:38   NM Pulmonary Perfusion  Result Date: 12/26/2020 CLINICAL DATA:  Respiratory failure EXAM: NUCLEAR MEDICINE PERFUSION LUNG SCAN TECHNIQUE: Perfusion images were obtained in multiple projections after intravenous injection of radiopharmaceutical. Ventilation scans intentionally deferred if perfusion  scan and chest x-ray adequate for interpretation during COVID 19 epidemic. RADIOPHARMACEUTICALS:  4.1 mCi Tc-45mMAA IV COMPARISON:  Chest x-ray 12/20/2020 FINDINGS: Slightly heterogeneous distribution of radiotracer within the lung fields. Multiple small bilateral wedge-shaped perfusion defects. No corresponding radiographic abnormality. No large mismatched segmental perfusion defect. IMPRESSION: Intermediate probability for pulmonary embolism. Electronically Signed   By: NDavina PokeD.O.   On: 12/04/2020 13:30   IR Fluoro Guide CV Line Left  Result Date: 12/17/2020 INDICATION: 60year old male referred for hemodialysis catheter placement EXAM: TUNNELED CENTRAL VENOUS HEMODIALYSIS CATHETER PLACEMENT WITH ULTRASOUND AND FLUOROSCOPIC GUIDANCE MEDICATIONS: 1 g vancomycin. The antibiotic was given in an appropriate time interval prior to skin puncture. ANESTHESIA/SEDATION: No moderate sedation. A total of Versed 0.5 mg and Fentanyl 0 mcg was administered intravenously. Moderate Sedation Time: 0 minutes. The patient's level of consciousness and vital signs were monitored continuously by radiology nursing throughout the procedure under my direct supervision. FLUOROSCOPY TIME:  Fluoroscopy Time: 0 minutes 54 seconds (3 mGy). COMPLICATIONS: None PROCEDURE: Informed written consent was obtained from the patient after a discussion of the risks, benefits, and alternatives to treatment. Questions regarding the procedure were encouraged and answered. The right neck and chest were prepped with chlorhexidine in a sterile fashion, and a sterile drape was applied covering the operative field. Maximum barrier sterile technique with sterile gowns and gloves were used for the procedure. A timeout was performed prior to the initiation of the procedure. Ultrasound survey was performed. Micropuncture kit was utilized to access the right internal jugular vein under direct, real-time ultrasound guidance after the overlying soft  tissues were anesthetized with 1% lidocaine with epinephrine. Stab incision was made with 11 blade scalpel. Microwire was passed centrally. The microwire was then marked to measure appropriate internal catheter length. External tunneled length was estimated. A total tip to cuff length of 19 cm was selected. 035 guidewire was advanced to the level of the IVC. Skin and subcutaneous tissues of chest wall below the clavicle were generously infiltrated with 1% lidocaine for local anesthesia. A small stab incision was made with 11 blade scalpel. The selected hemodialysis catheter was tunneled in a retrograde fashion from the anterior chest wall to the venotomy incision. Serial dilation was performed and then a peel-away sheath was placed. The catheter was then placed through the peel-away sheath with tips ultimately positioned within the superior aspect of the right atrium. Final catheter positioning was confirmed and documented with a spot radiographic image. The catheter aspirates and flushes normally. The catheter was flushed with appropriate volume heparin dwells. The  catheter exit site was secured with a 0-Prolene retention suture. Gel-Foam slurry was infused into the soft tissue tract. The venotomy incision was closed Derma bond and sterile dressing. Dressings were applied at the chest wall. Patient tolerated the procedure well and remained hemodynamically stable throughout. No complications were encountered and no significant blood loss encountered. IMPRESSION: Status post right IJ tunneled hemodialysis catheter placement. Catheter ready for use. Signed, Dulcy Fanny. Dellia Nims, RPVI [facility] Electronically Signed   By: Corrie Mckusick D.O.   On: 12/17/2020 15:26   IR US Guide Vasc Access Left  Result Date: 12/17/2020 INDICATION: 60 year old male referred for hemodialysis catheter placement EXAM: TUNNELED CENTRAL VENOUS HEMODIALYSIS CATHETER PLACEMENT WITH  ULTRASOUND AND FLUOROSCOPIC GUIDANCE MEDICATIONS: 1 g vancomycin. The antibiotic was given in an appropriate time interval prior to skin puncture. ANESTHESIA/SEDATION: No moderate sedation. A total of Versed 0.5 mg and Fentanyl 0 mcg was administered intravenously. Moderate Sedation Time: 0 minutes. The patient's level of consciousness and vital signs were monitored continuously by radiology nursing throughout the procedure under my direct supervision. FLUOROSCOPY TIME:  Fluoroscopy Time: 0 minutes 54 seconds (3 mGy). COMPLICATIONS: None PROCEDURE: Informed written consent was obtained from the patient after a discussion of the risks, benefits, and alternatives to treatment. Questions regarding the procedure were encouraged and answered. The right neck and chest were prepped with chlorhexidine in a sterile fashion, and a sterile drape was applied covering the operative field. Maximum barrier sterile technique with sterile gowns and gloves were used for the procedure. A timeout was performed prior to the initiation of the procedure. Ultrasound survey was performed. Micropuncture kit was utilized to access the right internal jugular vein under direct, real-time ultrasound guidance after the overlying soft tissues were anesthetized with 1% lidocaine with epinephrine. Stab incision was made with 11 blade scalpel. Microwire was passed centrally. The microwire was then marked to measure appropriate internal catheter length. External tunneled length was estimated. A total tip to cuff length of 19 cm was selected. 035 guidewire was advanced to the level of the IVC. Skin and subcutaneous tissues of chest wall below the clavicle were generously infiltrated with 1% lidocaine for local anesthesia. A small stab incision was made with 11 blade scalpel. The selected hemodialysis catheter was tunneled in a retrograde fashion from the anterior chest wall to the venotomy incision. Serial dilation was performed and then a peel-away  sheath was placed. The catheter was then placed through the peel-away sheath with tips ultimately positioned within the superior aspect of the right atrium. Final catheter positioning was confirmed and documented with a spot radiographic image. The catheter aspirates and flushes normally. The catheter was flushed with appropriate volume heparin dwells. The catheter exit site was secured with a 0-Prolene retention suture. Gel-Foam slurry was infused into the soft tissue tract. The venotomy incision was closed Derma bond and sterile dressing. Dressings were applied at the chest wall. Patient tolerated the procedure well and remained hemodynamically stable throughout. No complications were encountered and no significant blood loss encountered. IMPRESSION: Status post right IJ tunneled hemodialysis catheter placement. Catheter ready for use. Signed, Dulcy Fanny. Dellia Nims, RPVI [facility] Special[facility] Radiology Electronically Signed   By: Corrie Mckusick D.O.   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 ANESTHESIA/SEDATION: Fentanyl 100 mcg  IV; Versed 4 mg IV Sedation time: 14 minutes; The patient was continuously monitored during the procedure by the interventional radiology nurse under my direct supervision. CONTRAST:  None. COMPLICATIONS: SIR Level A - No therapy, no consequence. Retroperitoneal hemorrhage at site of biopsy, controlled with Gel-Foam slurry administration along needle track. PROCEDURE: Informed consent was obtained from the patient following an explanation of the procedure, risks, benefits and alternatives. A time out was performed prior to the initiation of the procedure. The patient was positioned in a partial left lateral decubitus position on  the CT table and a limited CT was performed for procedural planning demonstrating similar appearing multifocal right retroperitoneal lymphadenopathy. The procedure was planned. The operative site was prepped and draped in the usual sterile fashion. Appropriate trajectory was confirmed with a 22 gauge spinal needle after the adjacent tissues were anesthetized with 1% Lidocaine with epinephrine. Under intermittent CT guidance, a 17 gauge coaxial needle was advanced into the peripheral aspect of the mass. Appropriate positioning was confirmed and a total of 4 samples were obtained with an 18 gauge core needle biopsy device. A limited CT demonstrated focal hemorrhage about the biopsied lymph node. Gel-Foam slurry was injected through the introducer needle along the needle track. The co-axial needle was removed and hemostasis was achieved with manual compression. Additional limited postprocedural CT was negative for expanding hemorrhage or additional complication. A dressing was placed. The patient tolerated the procedure well without immediate postprocedural complication. IMPRESSION: Technically successful CT guided core needle biopsy of right retroperitoneal lymph node. Ruthann Cancer, MD [facility] Inter[facility] Radiology Specialists [facility] Radiology Electronically Signed   By: Ruthann Cancer MD   On: 12/07/2020 08:53   DG CHEST PORT 1 VIEW  Result Date: 12/10/2020 CLINICAL DATA:  Hypoxia.  History of cardiac transplant EXAM: PORTABLE CHEST 1 VIEW COMPARISON:  12/26/2020 FINDINGS: Prior median sternotomy. Heart size within normal limits. Atherosclerotic calcification of the aortic knob. Subtle right lower lobe opacity, slightly improved from prior. Trace right pleural effusion. No pneumothorax. IMPRESSION: 1. Subtle right lower lobe opacity, slightly improved from prior. 2. Trace right pleural effusion. Electronically Signed   By: Davina Poke D.O.   On: 12/10/2020 10:33   DG C-Arm 1-60 Min-No  Report  Result Date: 11/29/2020 Fluoroscopy was utilized by the requesting physician.  No radiographic interpretation.   ECHOCARDIOGRAM COMPLETE  Result Date: 11/30/2020    ECHOCARDIOGRAM REPORT   Patient Name:   TONEY DIFATTA Schoenherr Date of Exam: 11/30/2020 Medical Rec #:  [id number]         Height:       74.0 in Accession #:    [id number]        Weight:       221.0 lb Date of Birth:  04-02-1961        BSA:          2.268 m Patient Age:    76 years          BP:           103/59 mmHg Patient Gender: M                 HR:           94 bpm. Exam Location:  Inpatient Procedure: 2D Echo, Cardiac Doppler, Color Doppler and Intracardiac            Opacification Agent Indications:    Dyspnea R06.00  History:        Patient has prior history of Echocardiogram  examinations, most                 recent 05/12/2017. Arrythmias:Atrial Fibrillation; Risk                 Factors:Non-Smoker. GERD. 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  Sonographer:    Vickie Epley RDCS Referring Phys: [id number] [facility]  Sonographer Comments: Suboptimal apical window and suboptimal subcostal window. Pt unable to turn on side due to herniated discs. IMPRESSIONS  1. Left ventricular ejection fraction, by estimation, is 60 to 65%. The left ventricle has normal function. The left ventricle has no regional wall motion abnormalities. Left ventricular diastolic parameters were normal.  2. Right ventricular systolic function is normal. The right ventricular size is normal. There is normal pulmonary artery systolic pressure. The estimated right ventricular systolic pressure is 56.2 mmHg.  3. The mitral valve is normal in structure. No evidence of mitral valve regurgitation. No evidence of mitral stenosis.  4. The aortic valve is normal in structure. Aortic valve regurgitation is not visualized. No aortic stenosis is present.  5. The inferior vena cava is normal in size with greater than 50% respiratory variability, suggesting right atrial pressure of 3  mmHg. FINDINGS  Left Ventricle: Left ventricular ejection fraction, by estimation, is 60 to 65%. The left ventricle has normal function. The left ventricle has no regional wall motion abnormalities. Definity contrast agent was given IV to delineate the left ventricular  endocardial borders. The left ventricular internal cavity size was normal in size. There is no left ventricular hypertrophy. Left ventricular diastolic parameters were normal. Normal left ventricular filling pressure. Right Ventricle: The right ventricular size is normal. No increase in right ventricular wall thickness. Right ventricular systolic function is normal. There is normal pulmonary artery systolic pressure. The tricuspid regurgitant velocity is 2.39 m/s, and  with an assumed right atrial pressure of 3 mmHg, the estimated right ventricular systolic pressure is 56.3 mmHg. Left Atrium: Left atrial size was normal in size. Right Atrium: Right atrial size was normal in size. Pericardium: There is no evidence of pericardial effusion. Mitral Valve: The mitral valve is normal in structure. No evidence of mitral valve regurgitation. No evidence of mitral valve stenosis. Tricuspid Valve: The tricuspid valve is normal in structure. Tricuspid valve regurgitation is mild . No evidence of tricuspid stenosis. Aortic Valve: The aortic valve is normal in structure. Aortic valve regurgitation is not visualized. No aortic stenosis is present. Pulmonic Valve: The pulmonic valve was normal in structure. Pulmonic valve regurgitation is not visualized. No evidence of pulmonic stenosis. Aorta: The aortic root is normal in size and structure. Venous: The inferior vena cava is normal in size with greater than 50% respiratory variability, suggesting right atrial pressure of 3 mmHg. IAS/Shunts: No atrial level shunt detected by color flow Doppler.  LEFT VENTRICLE PLAX 2D LVIDd:         4.40 cm     Diastology LVIDs:         3.20 cm     LV e' medial:    10.00 cm/s LV  PW:         0.90 cm     LV E/e' medial:  7.5 LV IVS:        0.90 cm     LV e' lateral:   11.30 cm/s LVOT diam:     2.50 cm     LV E/e' lateral: 6.7 LV SV:         48 LV SV Index:  21 LVOT Area:     4.91 cm  LV Volumes (MOD) LV vol d, MOD A4C: 80.2 ml LV vol s, MOD A4C: 30.4 ml LV SV MOD A4C:     80.2 ml LEFT ATRIUM           Index LA diam:      2.70 cm 1.19 cm/m LA Vol (A4C): 16.3 ml 7.19 ml/m  AORTIC VALVE LVOT Vmax:   50.20 cm/s LVOT Vmean:  36.700 cm/s LVOT VTI:    0.099 m  AORTA Ao Root diam: 3.40 cm MITRAL VALVE               TRICUSPID VALVE MV Area (PHT): 5.38 cm    TR Peak grad:   22.8 mmHg MV Decel Time: 141 msec    TR Vmax:        239.00 cm/s MV E velocity: 75.40 cm/s MV A velocity: 36.00 cm/s  SHUNTS MV E/A ratio:  2.09        Systemic VTI:  0.10 m                            Systemic Diam: 2.50 cm Dani Gobble Croitoru MD Electronically signed by Sanda Klein MD Signature Date/Time: 11/30/2020/12:30:40 PM    Final    VAS [place] LOWER EXTREMITY VENOUS (DVT)  Result Date: 12/10/2020  Lower Venous DVT Study Indications: Pain.  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 Comparison Study: Previous 12/02/20 Negative Performing Technologist: Vonzell Schlatter RVT  Examination Guidelines: A complete evaluation includes B-mode imaging, spectral Doppler, color Doppler, and power Doppler as needed of all accessible portions of each vessel. Bilateral testing is considered an integral part of a complete examination. Limited examinations for reoccurring indications may be performed as noted. The reflux portion of the exam is performed with the patient in reverse Trendelenburg.  +---------+---------------+---------+-----------+----------+--------------+ RIGHT    CompressibilityPhasicitySpontaneityPropertiesThrombus Aging +---------+---------------+---------+-----------+----------+--------------+ CFV      Full           Yes      Yes                                  +---------+---------------+---------+-----------+----------+--------------+ SFJ      Full                                                        +---------+---------------+---------+-----------+----------+--------------+ FV Prox  Full                                                        +---------+---------------+---------+-----------+----------+--------------+ FV Mid   Full                                                        +---------+---------------+---------+-----------+----------+--------------+ FV DistalFull                                                        +---------+---------------+---------+-----------+----------+--------------+  PFV      Full                                                        +---------+---------------+---------+-----------+----------+--------------+ POP      Full           Yes      Yes                                 +---------+---------------+---------+-----------+----------+--------------+ PTV      None                                                        +---------+---------------+---------+-----------+----------+--------------+ PERO     None                                                        +---------+---------------+---------+-----------+----------+--------------+   +---------+---------------+---------+-----------+----------+--------------+ LEFT     CompressibilityPhasicitySpontaneityPropertiesThrombus Aging +---------+---------------+---------+-----------+----------+--------------+ CFV      Full           Yes      Yes                                 +---------+---------------+---------+-----------+----------+--------------+ SFJ      Full                                                        +---------+---------------+---------+-----------+----------+--------------+ FV Prox  Full                                                         +---------+---------------+---------+-----------+----------+--------------+ FV Mid   Full                                                        +---------+---------------+---------+-----------+----------+--------------+ FV DistalFull                                                        +---------+---------------+---------+-----------+----------+--------------+ PFV      Full                                                        +---------+---------------+---------+-----------+----------+--------------+  POP      Full           Yes      Yes                                 +---------+---------------+---------+-----------+----------+--------------+ PTV      Full                                                        +---------+---------------+---------+-----------+----------+--------------+ PERO     Full                                                        +---------+---------------+---------+-----------+----------+--------------+     Summary: RIGHT: - Findings consistent with acute deep vein thrombosis involving one right posterior tibial vein, and both right peroneal veins. - No cystic structure found in the popliteal fossa.  LEFT: - There is no evidence of deep vein thrombosis in the lower extremity.  - No cystic structure found in the popliteal fossa.  *See table(s) above for measurements and observations. Electronically signed by Jamelle Haring on 12/10/2020 at 7:16:00 PM.    Final    VAS [place] LOWER EXTREMITY VENOUS (DVT)  Result Date: 12/03/2020  Lower Venous DVT Study Indications: Pain.  Comparison Study: Previous 12/2019 Performing Technologist: Vonzell Schlatter RVT  Examination Guidelines: A complete evaluation includes B-mode imaging, spectral Doppler, color Doppler, and power Doppler as needed of all accessible portions of each vessel. Bilateral testing is considered an integral part of a complete examination. Limited examinations for reoccurring indications may be  performed as noted. The reflux portion of the exam is performed with the patient in reverse Trendelenburg.  +---------+---------------+---------+-----------+----------+--------------+ RIGHT    CompressibilityPhasicitySpontaneityPropertiesThrombus Aging +---------+---------------+---------+-----------+----------+--------------+ CFV      Full           Yes      Yes                                 +---------+---------------+---------+-----------+----------+--------------+ SFJ      Full                                                        +---------+---------------+---------+-----------+----------+--------------+ FV Prox  Full                                                        +---------+---------------+---------+-----------+----------+--------------+ FV Mid   Full                                                        +---------+---------------+---------+-----------+----------+--------------+  FV DistalFull                                                        +---------+---------------+---------+-----------+----------+--------------+ PFV      Full                                                        +---------+---------------+---------+-----------+----------+--------------+ POP      Full           Yes      Yes                                 +---------+---------------+---------+-----------+----------+--------------+ PTV      Full                                                        +---------+---------------+---------+-----------+----------+--------------+ PERO     Full                                                        +---------+---------------+---------+-----------+----------+--------------+   +---------+---------------+---------+-----------+----------+--------------+ LEFT     CompressibilityPhasicitySpontaneityPropertiesThrombus Aging +---------+---------------+---------+-----------+----------+--------------+ CFV      Full            Yes      Yes                                 +---------+---------------+---------+-----------+----------+--------------+ SFJ      Full                                                        +---------+---------------+---------+-----------+----------+--------------+ FV Prox  Full                                                        +---------+---------------+---------+-----------+----------+--------------+ FV Mid   Full                                                        +---------+---------------+---------+-----------+----------+--------------+ FV DistalFull                                                        +---------+---------------+---------+-----------+----------+--------------+  PFV      Full                                                        +---------+---------------+---------+-----------+----------+--------------+ POP      Full           Yes      Yes                                 +---------+---------------+---------+-----------+----------+--------------+ PTV      Full                                                        +---------+---------------+---------+-----------+----------+--------------+ PERO     Full                                                        +---------+---------------+---------+-----------+----------+--------------+     Summary: RIGHT: - There is no evidence of deep vein thrombosis in the lower extremity.  - No cystic structure found in the popliteal fossa.  LEFT: - There is no evidence of deep vein thrombosis in the lower extremity.  - No cystic structure found in the popliteal fossa.  *See table(s) above for measurements and observations. Electronically signed by Ruta Hinds MD on 12/03/2020 at 12:15:41 PM.    Final    ECHOCARDIOGRAM LIMITED  Result Date: 12/10/2020    ECHOCARDIOGRAM LIMITED REPORT   Patient Name:   DUQUAN GILLOOLY Pennix Date of Exam: 12/10/2020 Medical Rec #:  [id number]          Height:       74.0 in Accession #:    [id number]        Weight:       230.4 lb Date of Birth:  04/12/1961        BSA:          2.308 m Patient Age:    67 years          BP:           105/72 mmHg Patient Gender: M                 HR:           99 bpm. Exam Location:  Inpatient Procedure: Limited Echo, Cardiac Doppler and Color Doppler Indications:    Acute DVT  History:        Patient has prior history of Echocardiogram examinations, most                 recent 11/30/2020. Arrythmias:Atrial Fibrillation. CKD. GERD.                 S/P heart transplant 01/22/16.  Sonographer:    Clayton Lefort RDCS (AE) Referring Phys: [id number] [facility]  Sonographer Comments: Technically difficult study due to poor echo windows, suboptimal apical window and suboptimal subcostal window. Unable to roll patient due to herniated discs. IMPRESSIONS  1. S/P Heart Transplant. Left ventricular ejection fraction, by estimation, is 60 to 65%. The left ventricle has normal function. Left ventricular endocardial border not optimally defined to evaluate regional wall motion. There is moderate concentric left ventricular hypertrophy. Left ventricular diastolic function could not be evaluated.  2. Right ventricular systolic function is moderately reduced. The right ventricular size is mildly enlarged. Tricuspid regurgitation signal is inadequate for assessing PA pressure.  3. A small pericardial effusion is present. The pericardial effusion is surrounding the apex.  4. Tricuspid valve regurgitation is mild to moderate. FINDINGS  Left Ventricle: S/P Heart Transplant. Left ventricular ejection fraction, by estimation, is 60 to 65%. The left ventricle has normal function. Left ventricular endocardial border not optimally defined to evaluate regional wall motion. There is moderate concentric left ventricular hypertrophy. Left ventricular diastolic function could not be evaluated. Right Ventricle: The right ventricular size is mildly enlarged. Right  ventricular systolic function is moderately reduced. Tricuspid regurgitation signal is inadequate for assessing PA pressure. Pericardium: A small pericardial effusion is present. The pericardial effusion is surrounding the apex. Tricuspid Valve: Tricuspid valve regurgitation is mild to moderate. Venous: The inferior vena cava was not well visualized. LEFT VENTRICLE PLAX 2D LVIDd:         3.60 cm LVIDs:         2.30 cm LV PW:         1.40 cm LV IVS:        1.50 cm LVOT diam:     2.50 cm LVOT Area:     4.91 cm  LEFT ATRIUM         Index LA diam:    2.80 cm 1.21 cm/m   AORTA Ao Root diam: 3.70 cm Ao Asc diam:  3.10 cm TRICUSPID VALVE TR Peak grad:   52.4 mmHg TR Vmax:        362.00 cm/s  SHUNTS Systemic Diam: 2.50 cm Fransico Him MD Electronically signed by Fransico Him MD Signature Date/Time: 12/10/2020/3:35:06 PM    Final     Lab Data:  CBC: Recent Labs  Lab 12/14/20 0717 12/15/20 0755 12/16/20 0857 12/17/20 0319 12/18/20 0409  WBC 25.0* 19.9* 23.5* 22.1* 24.1*  HGB 13.7 14.1 14.3 14.5 13.9  HCT 39.9 41.7 44.1 43.8 41.8  MCV 92.4 94.1 95.9 94.0 93.9  PLT 464* 472* 472* 443* 662   Basic Metabolic Panel: Recent Labs  Lab 12/14/20 0717 12/15/20 0755 12/16/20 0857 12/17/20 0319 12/18/20 0409  NA 125* 125* 123* 122* 125*  K 4.9 5.4* 5.9* 5.9* 5.2*  CL 91* 90* 86* 87* 89*  CO2 22 19* 20* 18* 21*  GLUCOSE 117* 122* 138* 122* 105*  BUN 74* 87* 105* 120* 100*  CREATININE 3.48* 3.51* 4.68* 5.26* 4.56*  CALCIUM 9.1 9.2 8.9 8.6* 8.6*   GFR: Estimated Creatinine Clearance: 22.5 mL/min (A) (by C-G formula based on SCr of 4.56 mg/dL (H)). Liver Function Tests: Recent Labs  Lab 12/12/20 0616  AST 20  ALT 9  ALKPHOS 60  BILITOT 0.8  PROT 5.8*  ALBUMIN 2.8*   No results for input(s): LIPASE, AMYLASE in the last 168 hours. No results for input(s): AMMONIA in the last 168 hours. Coagulation Profile: No results for input(s): INR, PROTIME in the last 168 hours. Cardiac Enzymes: No  results for input(s): CKTOTAL, CKMB, CKMBINDEX, TROPONINI in the last 168 hours. BNP (last 3 results) No results for input(s): PROBNP in the last 8760 hours. HbA1C: No results for input(s): HGBA1C in the last  72 hours. CBG: No results for input(s): GLUCAP in the last 168 hours. Lipid Profile: No results for input(s): CHOL, HDL, LDLCALC, TRIG, CHOLHDL, LDLDIRECT in the last 72 hours. Thyroid Function Tests: No results for input(s): TSH, T4TOTAL, FREET4, T3FREE, THYROIDAB in the last 72 hours. Anemia Panel: No results for input(s): VITAMINB12, FOLATE, FERRITIN, TIBC, IRON, RETICCTPCT in the last 72 hours. Urine analysis:    Component Value Date/Time   COLORURINE YELLOW 12/15/2020 1557   APPEARANCEUR CLOUDY (A) 12/15/2020 1557   LABSPEC 1.011 12/15/2020 1557   PHURINE 5.0 12/15/2020 1557   GLUCOSEU NEGATIVE 12/15/2020 1557   HGBUR LARGE (A) 12/15/2020 1557   HGBUR negative 05/12/2008 0957   BILIRUBINUR NEGATIVE 12/15/2020 [place] 12/15/2020 1557   PROTEINUR 30 (A) 12/15/2020 1557   UROBILINOGEN 0.2 07/31/2015 2311   NITRITE NEGATIVE 12/15/2020 1557   LEUKOCYTESUR TRACE (A) 12/15/2020 1557     Ripudeep Rai M.D. [facility] 12/18/2020, 12:49 PM   Call night coverage person covering after 7pm

## 2020-12-18 NOTE — Progress Notes (Signed)
[facility] Progress Note     Assessment/ Plan:   1. Dialysis Dependent AKI on CKD 3a - b/l creat 1.2- 1.4 from oct 2021.   Renal function worsening over last few weeks. UA +wbc's and rbc's, no sig proteinuria. Imaging shows stable R ureteral stent and stable R collecting system dilatation. Has new significant leg edema. ECHO showed normal EF.   Creat 1.8 on admission > up to 2.3 then 2.8 after lasix tried, then 3.1 >  3.2 > 3.4 > up to 4.5 -- HD.  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   IR placed [facility] 1/17 R IJ, HD #1 1/17  HD #2 1/19: 2K, 2.5L, 2.5h, TDC 2. Hyperkalemia - Improved, Cont lokelma for now until on full HD dosing 3. Hyponatremia -  Inability to excrete free water 2/2 low GFR.  LImit PO, will correct with HD 4. High-grade bladder cancer- recent dx , treated w/ BCG in Aug 2021, sp TURBT x 2 Aug and Sept. New RP adenopathy w/ R hydro in Dec 2021 > cysto and R ureteral stent per urology. Biopsy RP mass done last week showed poorly diff urothelial carcinoma, molecular markers pending. Onc consulting w/ [facility].   5. Volume excess - minimize fluid intake 6. H/o Hodgkin's lymphoma - remote 7. H/o cardiac transplant - taking prograf 1.5 mg /d and pred 5 /d 8. Acute RLE DVT - per primary team. Positive for superficial venous thrombosis/thrombophlebitis of the mid right leg greater saphenous vein but neg for DVT. 9. Chronic diast CHF - had prior LVEF 10% but now has corrected to 60-65%   Subjective:   Denies f/c/n/v/dyspnea. No events overnight   Objective:   BP 91/63 (BP Location: Right Arm)   Pulse 86   Temp 97.9 F (36.6 C) (Oral)   Resp 17   Ht _0  (1.88 m)   Wt 104.7 kg   SpO2 91%   BMI 29.64 kg/m   Intake/Output Summary (Last 24 hours) at 12/18/2020 1122 Last data filed at 12/18/2020 0800 Gross per 24 hour  Intake 747.03 ml  Output 1710 ml  Net -962.97 ml   Weight  change: 0.4 kg  Physical Exam: Genalert, not in distress No jvd or bruits Chest clear bilatto bases RRR no MRG Abdprotuberant, no ascites by exam, no hsm, +BS Ext3+ pitting bilat LE from feet to hips to abd/chest wall Neuro is alert, Ox 3 , nf  Imaging: IR Fluoro Guide CV Line Left  Result Date: 12/17/2020 INDICATION: 60 year old male referred for hemodialysis catheter placement EXAM: TUNNELED CENTRAL VENOUS HEMODIALYSIS CATHETER PLACEMENT WITH ULTRASOUND AND FLUOROSCOPIC GUIDANCE MEDICATIONS: 1 g vancomycin. The antibiotic was given in an appropriate time interval prior to skin puncture. ANESTHESIA/SEDATION: No moderate sedation. A total of Versed 0.5 mg and Fentanyl 0 mcg was administered intravenously. Moderate Sedation Time: 0 minutes. The patient's level of consciousness and vital signs were monitored continuously by radiology nursing throughout the procedure under my direct supervision. FLUOROSCOPY TIME:  Fluoroscopy Time: 0 minutes 54 seconds (3 mGy). COMPLICATIONS: None PROCEDURE: Informed written consent was obtained from the patient after a discussion of the risks, benefits, and alternatives to treatment. Questions regarding the procedure were encouraged and answered. The right neck and chest were prepped with chlorhexidine in a sterile fashion, and a sterile drape was applied covering the operative field. Maximum barrier sterile technique with sterile gowns and gloves were used for the procedure. A  timeout was performed prior to the initiation of the procedure. Ultrasound survey was performed. Micropuncture kit was utilized to access the right internal jugular vein under direct, real-time ultrasound guidance after the overlying soft tissues were anesthetized with 1% lidocaine with epinephrine. Stab incision was made with 11 blade scalpel. Microwire was passed centrally. The microwire was then marked to measure appropriate internal catheter length. External tunneled length was estimated. A  total tip to cuff length of 19 cm was selected. 035 guidewire was advanced to the level of the IVC. Skin and subcutaneous tissues of chest wall below the clavicle were generously infiltrated with 1% lidocaine for local anesthesia. A small stab incision was made with 11 blade scalpel. The selected hemodialysis catheter was tunneled in a retrograde fashion from the anterior chest wall to the venotomy incision. Serial dilation was performed and then a peel-away sheath was placed. The catheter was then placed through the peel-away sheath with tips ultimately positioned within the superior aspect of the right atrium. Final catheter positioning was confirmed and documented with a spot radiographic image. The catheter aspirates and flushes normally. The catheter was flushed with appropriate volume heparin dwells. The catheter exit site was secured with a 0-Prolene retention suture. Gel-Foam slurry was infused into the soft tissue tract. The venotomy incision was closed Derma bond and sterile dressing. Dressings were applied at the chest wall. Patient tolerated the procedure well and remained hemodynamically stable throughout. No complications were encountered and no significant blood loss encountered. IMPRESSION: Status post right IJ tunneled hemodialysis catheter placement. Catheter ready for use. Signed, Dulcy Fanny. Dellia Nims, RPVI [facility] Electronically Signed   By: Corrie Mckusick D.O.   On: 12/17/2020 15:26   IR US Guide Vasc Access Left  Result Date: 12/17/2020 INDICATION: 60 year old male referred for hemodialysis catheter placement EXAM: TUNNELED CENTRAL VENOUS HEMODIALYSIS CATHETER PLACEMENT WITH ULTRASOUND AND FLUOROSCOPIC GUIDANCE MEDICATIONS: 1 g vancomycin. The antibiotic was given in an appropriate time interval prior to skin puncture. ANESTHESIA/SEDATION: No moderate sedation. A total of Versed 0.5 mg and Fentanyl 0 mcg was administered  intravenously. Moderate Sedation Time: 0 minutes. The patient's level of consciousness and vital signs were monitored continuously by radiology nursing throughout the procedure under my direct supervision. FLUOROSCOPY TIME:  Fluoroscopy Time: 0 minutes 54 seconds (3 mGy). COMPLICATIONS: None PROCEDURE: Informed written consent was obtained from the patient after a discussion of the risks, benefits, and alternatives to treatment. Questions regarding the procedure were encouraged and answered. The right neck and chest were prepped with chlorhexidine in a sterile fashion, and a sterile drape was applied covering the operative field. Maximum barrier sterile technique with sterile gowns and gloves were used for the procedure. A timeout was performed prior to the initiation of the procedure. Ultrasound survey was performed. Micropuncture kit was utilized to access the right internal jugular vein under direct, real-time ultrasound guidance after the overlying soft tissues were anesthetized with 1% lidocaine with epinephrine. Stab incision was made with 11 blade scalpel. Microwire was passed centrally. The microwire was then marked to measure appropriate internal catheter length. External tunneled length was estimated. A total tip to cuff length of 19 cm was selected. 035 guidewire was advanced to the level of the IVC. Skin and subcutaneous tissues of chest wall below the clavicle were generously infiltrated with 1% lidocaine for local anesthesia. A small stab incision was made with 11 blade scalpel. The selected hemodialysis catheter was tunneled in a retrograde fashion from the  anterior chest wall to the venotomy incision. Serial dilation was performed and then a peel-away sheath was placed. The catheter was then placed through the peel-away sheath with tips ultimately positioned within the superior aspect of the right atrium. Final catheter positioning was confirmed and documented with a spot radiographic image. The  catheter aspirates and flushes normally. The catheter was flushed with appropriate volume heparin dwells. The catheter exit site was secured with a 0-Prolene retention suture. Gel-Foam slurry was infused into the soft tissue tract. The venotomy incision was closed Derma bond and sterile dressing. Dressings were applied at the chest wall. Patient tolerated the procedure well and remained hemodynamically stable throughout. No complications were encountered and no significant blood loss encountered. IMPRESSION: Status post right IJ tunneled hemodialysis catheter placement. Catheter ready for use. Signed, Dulcy Fanny. Dellia Nims, RPVI [facility] Electronically Signed   By: Corrie Mckusick D.O.   On: 12/17/2020 15:26    Labs: BMET Recent Labs  Lab 12/12/20 0616 12/13/20 0657 12/14/20 0717 12/15/20 0755 12/16/20 0857 12/17/20 0319 12/18/20 0409  NA 126* 124* 125* 125* 123* 122* 125*  K 4.3 4.9 4.9 5.4* 5.9* 5.9* 5.2*  CL 91* 90* 91* 90* 86* 87* 89*  CO2 21* 20* 22 19* 20* 18* 21*  GLUCOSE 108* 124* 117* 122* 138* 122* 105*  BUN 55* 65* 74* 87* 105* 120* 100*  CREATININE 3.17* 3.25* 3.48* 3.51* 4.68* 5.26* 4.56*  CALCIUM 9.3 9.2 9.1 9.2 8.9 8.6* 8.6*   CBC Recent Labs  Lab 12/15/20 0755 12/16/20 0857 12/17/20 0319 12/18/20 0409  WBC 19.9* 23.5* 22.1* 24.1*  HGB 14.1 14.3 14.5 13.9  HCT 41.7 44.1 43.8 41.8  MCV 94.1 95.9 94.0 93.9  PLT 472* 472* 443* 360    Medications:    . allopurinol  200 mg Oral Daily  . artificial tears  1 application Both Eyes QHS  . azelastine  1 spray Each Nare Daily   And  . fluticasone  1 spray Each Nare Daily  . cetirizine  10 mg Oral QHS  . Chlorhexidine Gluconate Cloth  6 each Topical Daily  . Chlorhexidine Gluconate Cloth  6 each Topical Q0600  . dexamethasone  4 mg Oral BID WC  . fentaNYL  2 patch Transdermal Q72H  . fentaNYL  1 patch Transdermal Q72H  . lactulose  10 g Oral BID  .  levothyroxine  125 mcg Oral Daily  . metoCLOPramide  5 mg Oral TID AC & HS  . polyvinyl alcohol  1 drop Both Eyes TID  . senna-docusate  3 tablet Oral BID  . sodium zirconium cyclosilicate  10 g Oral BID  . Tacrolimus ER  1.5 mg Oral Daily  . traZODone  100 mg Oral QHS      Rexene Agent, MD  12/18/2020, 11:22 AM

## 2020-12-18 NOTE — Progress Notes (Signed)
Oncology Short Note  Would recommend hospitalist consulting cardiology @ [facility] and co-ordinating with his transplant Cardiologist Dr Melvyn Novas @ [phone]/heart transplant [phone] and inpatient pharmacist to monitor and adjust his immunosuppression/tacrolimus dose in the setting of changing renal function.  Sullivan Lone MD MS

## 2020-12-18 NOTE — Progress Notes (Signed)
  ADVANCED HF NOTE  We are asked by Dr. Irene Limbo to weigh in on Glenn Gonzalez's immunosuppression regimen.   Patient well known to me as I cared for him prior to his heart transplant in 2017. I have not cared for him routinely since. He has been followed by Dr. Mosetta Pigeon at [facility].   Recently found to have metastatic poorly differentiated urothelial carcinoma, CA with retroperitoneal adenopathy and R hydronephrosis. Developed AKI and now on HD.   Echo this admit with normal EF.   Home immunosuppression regimen is Tacrolimus 1.5 mg per day + prednisone 5mg  day.  Last TAC level 4.7 om 11/30/20 (Goal level 5-8)  While in hospital his prednisone was switched to dexamethasone 4 bid per Dr. Cinda Quest recommendation that it might help with his energy level (per Hospitalist note 12/15/20 - there is no note from Dr. Sonny Dandy).   I spoke with Dr. Mosetta Pigeon today and agrees with continuing the tacrolimus at current dosing with routine serum monitoring to keep level 5-8 (tacrolimus predominantly hepatically cleared). Would also recommend decreasing steroid dose as quickly as possible.   Please feel free to reach out to me personally if there are further questions.   [name], MD  4:23 PM

## 2020-12-18 NOTE — Progress Notes (Signed)
MEDICATION RELATED CONSULT NOTE - INITIAL   Pharmacy Consult for Tacrolimus monitoring Indication: heart transplant  Allergies  Allergen Reactions  . Digoxin Other (See Comments)    gynecomastia   . Phencyclidine Other (See Comments)    PCP derived antibiotic > Insomnia  . Spironolactone Other (See Comments)    Gynecomastia  . Oxybutynin Chloride     Other reaction(s): low BP  . Elemental Sulfur Rash  . Lactose Intolerance (Gi) Diarrhea and Other (See Comments)    cramps  . Penicillins Other (See Comments)    Cramps in stomach Did it involve swelling of the face/tongue/throat, SOB, or low BP? No Did it involve sudden or severe rash/hives, skin peeling, or any reaction on the inside of your mouth or nose? No Did you need to seek medical attention at a hospital or doctor's office? No When did it last happen?unknown If all above answers are "NO", may proceed with cephalosporin use.       Patient Measurements: Height: 6\' 2"  (188 cm) Weight: 104.7 kg (230 lb 13.2 oz) IBW/kg (Calculated) : 82.2  Vital Signs: Temp: 97.9 F (36.6 C) (01/18 0930) Temp Source: Oral (01/18 0930) BP: 91/63 (01/18 0930) Pulse Rate: 86 (01/18 0930) Intake/Output from previous day: 01/17 0701 - 01/18 0700 In: 096 [P.O.:300; I.V.:327] Out: 1710 [Urine:610] Intake/Output from this shift: Total I/O In: 240 [P.O.:240] Out: 250 [Urine:250]  Labs: Recent Labs    12/16/20 0857 12/17/20 0319 12/18/20 0409  WBC 23.5* 22.1* 24.1*  HGB 14.3 14.5 13.9  HCT 44.1 43.8 41.8  PLT 472* 443* 360  CREATININE 4.68* 5.26* 4.56*   Estimated Creatinine Clearance: 22.5 mL/min (A) (by C-G formula based on SCr of 4.56 mg/dL (H)).   Microbiology: Recent Results (from the past 720 hour(s))  SARS Coronavirus 2 by RT PCR (hospital order, performed in [facility] hospital lab) Nasopharyngeal Nasopharyngeal Swab     Status: None   Collection Time: 11/29/20  2:45 PM   Specimen: Nasopharyngeal Swab   Result Value Ref Range Status   SARS Coronavirus 2 NEGATIVE NEGATIVE Final    Comment: (NOTE) SARS-CoV-2 target nucleic acids are NOT DETECTED.  The SARS-CoV-2 RNA is generally detectable in upper and lower respiratory specimens during the acute phase of infection. The lowest concentration of SARS-CoV-2 viral copies this assay can detect is 250 copies / mL. A negative result does not preclude SARS-CoV-2 infection and should not be used as the sole basis for treatment or other patient management decisions.  A negative result may occur with improper specimen collection / handling, submission of specimen other than nasopharyngeal swab, presence of viral mutation(s) within the areas targeted by this assay, and inadequate number of viral copies (<250 copies / mL). A negative result must be combined with clinical observations, patient history, and epidemiological information.  Fact Sheet for Patients:   StrictlyIdeas.no  Fact Sheet for Healthcare Providers: BankingDealers.co.za  This test is not yet approved or  cleared by the [place] FDA and has been authorized for detection and/or diagnosis of SARS-CoV-2 by FDA under an Emergency Use Authorization (EUA).  This EUA will remain in effect (meaning this test can be used) for the duration of the COVID-19 declaration under Section 564(b)(1) of the Act, 21 U.S.C. section 360bbb-3(b)(1), unless the authorization is terminated or revoked sooner.  Performed at [facility], [place] [address], [place], [place] [address]   Urine Culture     Status: None   Collection Time: 11/29/20  6:35 PM  Specimen: PATH Other; Tissue  Result Value Ref Range Status   Specimen Description   Final    URINE, RANDOM Performed at [place] [address] [place], [place] [address]    Special Requests   Final    NONE Performed at [facility], [place] [address] [place], [place] [address]    Culture   Final    NO GROWTH Performed at [facility], [place] [address] [place], [place] [address]    Report Status 12/18/2020 FINAL  Final  Culture, blood (x 2)     Status: None   Collection Time: 11/30/20 12:31 AM   Specimen: BLOOD  Result Value Ref Range Status   Specimen Description   Final    BLOOD RIGHT HAND Performed at [place] [address] [place], [place] [address]    Special Requests   Final    BOTTLES DRAWN AEROBIC ONLY Blood Culture adequate volume Performed at [place] [address] [place], [place] [address]    Culture   Final    NO GROWTH 5 DAYS Performed at [facility], [place] [address], [place], [place] [address]    Report Status 12/18/2020 FINAL  Final  Culture, blood (x 2)     Status: None   Collection Time: 11/30/20 12:31 AM   Specimen: BLOOD  Result Value Ref Range Status   Specimen Description   Final    BLOOD LEFT HAND Performed at [place] [address] [place], [place] [address]    Special Requests   Final    BOTTLES DRAWN AEROBIC ONLY Blood Culture adequate volume Performed at [place] [address] [place], [place] [address]    Culture   Final    NO GROWTH 5 DAYS Performed at [facility], [place] [address] [place],  [address]    Report Status 12/18/2020 FINAL  Final  Resp Panel by RT-PCR (Flu A&B, Covid) Nasopharyngeal Swab     Status: None   Collection Time: 11/30/20  4:30 AM   Specimen: Nasopharyngeal Swab; Nasopharyngeal(NP) swabs in vial transport medium  Result Value Ref Range Status   SARS Coronavirus 2 by RT PCR NEGATIVE NEGATIVE Final    Comment: (NOTE) SARS-CoV-2 target nucleic acids are NOT DETECTED.  The SARS-CoV-2 RNA is generally detectable in upper respiratory specimens during the acute phase of infection. The lowest concentration of SARS-CoV-2 viral copies  this assay can detect is 138 copies/mL. A negative result does not preclude SARS-Cov-2 infection and should not be used as the sole basis for treatment or other patient management decisions. A negative result may occur with  improper specimen collection/handling, submission of specimen other than nasopharyngeal swab, presence of viral mutation(s) within the areas targeted by this assay, and inadequate number of viral copies(<138 copies/mL). A negative result must be combined with clinical observations, patient history, and epidemiological information. The expected result is Negative.  Fact Sheet for Patients:  EntrepreneurPulse.com.au  Fact Sheet for Healthcare Providers:  IncredibleEmployment.be  This test is no t yet approved or cleared by the [place] FDA and  has been authorized for detection and/or diagnosis of SARS-CoV-2 by FDA under an Emergency Use Authorization (EUA). This EUA will remain  in effect (meaning this test can be used) for the duration of the COVID-19 declaration under Section 564(b)(1) of the Act, 21 U.S.C.section 360bbb-3(b)(1), unless the authorization is terminated  or revoked sooner.  Influenza A by PCR NEGATIVE NEGATIVE Final   Influenza B by PCR NEGATIVE NEGATIVE Final    Comment: (NOTE) The Xpert Xpress SARS-CoV-2/FLU/RSV plus assay is intended as an aid in the diagnosis of influenza from Nasopharyngeal swab specimens and should not be used as a sole basis for treatment. Nasal washings and aspirates are unacceptable for Xpert Xpress SARS-CoV-2/FLU/RSV testing.  Fact Sheet for Patients: EntrepreneurPulse.com.au  Fact Sheet for Healthcare Providers: IncredibleEmployment.be  This test is not yet approved or cleared by the [place] FDA and has been authorized for detection and/or diagnosis of SARS-CoV-2 by FDA under an Emergency Use Authorization (EUA). This EUA  will remain in effect (meaning this test can be used) for the duration of the COVID-19 declaration under Section 564(b)(1) of the Act, 21 U.S.C. section 360bbb-3(b)(1), unless the authorization is terminated or revoked.  Performed at [facility], [place] [address] [place], [place] [address]   MRSA PCR Screening     Status: None   Collection Time: 11/30/20 10:22 AM   Specimen: Nasopharyngeal  Result Value Ref Range Status   MRSA by PCR NEGATIVE NEGATIVE Final    Comment:        The GeneXpert MRSA Assay (FDA approved for NASAL specimens only), is one component of a comprehensive MRSA colonization surveillance program. It is not intended to diagnose MRSA infection nor to guide or monitor treatment for MRSA infections. Performed at [facility], [place] [address], [place], [place] [address]   SARS CORONAVIRUS 2 (TAT 6-24 HRS) Nasopharyngeal Nasopharyngeal Swab     Status: None   Collection Time: 12/28/2020  1:48 PM   Specimen: Nasopharyngeal Swab  Result Value Ref Range Status   SARS Coronavirus 2 NEGATIVE NEGATIVE Final    Comment: (NOTE) SARS-CoV-2 target nucleic acids are NOT DETECTED.  The SARS-CoV-2 RNA is generally detectable in upper and lower respiratory specimens during the acute phase of infection. Negative results do not preclude SARS-CoV-2 infection, do not rule out co-infections with other pathogens, and should not be used as the sole basis for treatment or other patient management decisions. Negative results must be combined with clinical observations, patient history, and epidemiological information. The expected result is Negative.  Fact Sheet for Patients: SugarRoll.be  Fact Sheet for Healthcare Providers: https://www.woods-mathews.com/  This test is not yet approved or cleared by the [place] FDA and  has been authorized for detection and/or diagnosis of SARS-CoV-2 by FDA  under an Emergency Use Authorization (EUA). This EUA will remain  in effect (meaning this test can be used) for the duration of the COVID-19 declaration under Se ction 564(b)(1) of the Act, 21 U.S.C. section 360bbb-3(b)(1), unless the authorization is terminated or revoked sooner.  Performed at [facility], [place] [address] [place], [place] [address]     Medical History: Past Medical History:  Diagnosis Date  . A-fib ([place])    IN PAST with old heart   . Bladder tumor   . Bulging lumbar disc    states " i threw my back out on tuesday 6-16, i ahve a bulging disc, but i can lie on my back a side "   . Chronic fatigue   . Chronic kidney disease    CREATININE NOW 1.5 stage 1 ckd  . Colon cancer ([place]) DX 2019   SURGERY DONE  . Complication of anesthesia    AFTER HEART TRANSPLANT TOOK 2 YEARS TO GET BACK TO SELF; patient does not want aneshesia for endoscopy  procedures   . Complication of anesthesia    likes propofol wants as few of anesthesia drugs as possible  . Depression    SITUATIONAL  . GERD (gastroesophageal reflux disease)   . Gout yrs ago  . History of chemotherapy 1982 for hodgkins   DAMAGED HEART MUSCLE WITH RADIATION ALSO  . History of kidney stones   . History of radiation therapy 1982   for hodgkins lymphoma  . Hodgkin's lymphoma ([place]) 1980   IIIB. 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  . Hypothyroidism   . Pneumonia FROM CHF 2016   recurrent pneumonia sedf.  rad tx for lymphoma  . Skin abnormality    right chest small area with squamous cell area removed 3 weeks ago as of 07-27-2020  . Skin cancer    AREAS REMOVED FROM [facility], CHECK AND BACK AND HEAD SEPT 2019  . Status post heart transplantation ([place]) 2017   caused by chemo drug adriomycin    Medications:  Medications Prior to Admission  Medication Sig Dispense Refill Last Dose  . acetaminophen (TYLENOL) 650 MG CR tablet Take 1,300 mg by mouth daily as needed for pain.   12/03/2020  .  allopurinol (ZYLOPRIM) 100 MG tablet Take 200 mg by mouth daily.   10 12/10/2020 at Unknown time  . Azelastine-Fluticasone 137-50 MCG/ACT SUSP Place 1 spray into both nostrils daily.   12/04/2020 at Unknown time  . calcium carbonate (TUMS) 500 MG chewable tablet Chew 2,000 mg by mouth daily as needed for indigestion or heartburn.   12/04/2020 at Unknown time  . [EXPIRED] cefdinir (OMNICEF) 300 MG capsule Take 1 capsule (300 mg total) by mouth 2 (two) times daily for 7 days. (Patient taking differently: Take 300 mg by mouth 2 (two) times daily. Start date: 12/03/20) 14 capsule 0 12/30/2020 at Unknown time  . cetirizine (ZYRTEC) 10 MG tablet Take 10 mg by mouth at bedtime.    12/04/2020 at Unknown time  . cyclobenzaprine (FLEXERIL) 10 MG tablet Take 10 mg by mouth 3 (three) times daily as needed for muscle spasms.   12/03/2020  . ENVARSUS XR 0.75 MG TB24 Take 1.5 mg by mouth daily.   12/04/2020 at Unknown time  . levothyroxine (SYNTHROID, LEVOTHROID) 125 MCG tablet Take 125 mcg by mouth daily.    12/26/2020 at Unknown time  . NON FORMULARY Apply 1 application topically daily. [facility] Anti-fungal (Nail)  (3) Refills *added the urea 40% to the anti-fungal (nail) -#1   12/04/2020 at Unknown time  . predniSONE (DELTASONE) 2.5 MG tablet Take 1 tablet (2.5 mg total) by mouth daily. (Patient taking differently: Take 5 mg by mouth daily.) 20 tablet 0 12/23/2020 at Unknown time  . RABEprazole (ACIPHEX) 20 MG tablet Take 2 tablets (40 mg total) by mouth 2 (two) times daily before a meal. 120 tablet 2 12/29/2020 at Unknown time  . [EXPIRED] traMADol (ULTRAM) 50 MG tablet Take 1 tablet (50 mg total) by mouth every 6 (six) hours as needed for up to 7 days. (Patient taking differently: Take 50 mg by mouth every 6 (six) hours as needed for severe pain.) 28 tablet 0 12/13/2020 at Unknown time  . [EXPIRED] saccharomyces boulardii (FLORASTOR) 250 MG capsule Take 1 capsule (250 mg total) by mouth 2 (two) times daily for 7 days. 14  capsule 0   . trimethoprim (TRIMPEX) 100 MG tablet TAKE 1 TABLET BY MOUTH EVERY DAY (Patient not taking: No sig reported) 30 tablet 1 Not Taking at Unknown  time  . trimethoprim (TRIMPEX) 100 MG tablet TAKE 1 TABLET BY MOUTH EVERY DAY (Patient not taking: No sig reported) 30 tablet 11 Not Taking at Unknown time    Assessment: 60 y.o. M known to pharmacy from heparin dosing.   Pt is s/p heart transplant on Envarsus XR (tacrolimus) + prednisone PTA. Home tacrolimus resumed and prednisone changed to dexamethasone per Dr. Kirby Crigler. Noted that pt was also on sirolumus but this has been held since 1/10 due to kidney cancer (per [facility] records). Last TAC level 4.7 on 11/30/20 per [facility] records - appear to be checking every 6 mos.  Tacrolimus primarily cleared hepatically  Goal of Therapy:  Goal 5-8 ng/ml (per Dr. Mosetta Pigeon at [facility])  Plan:  Continue tacrolimus at current dose (see Dr. Haroldine Laws note 1/18) Will monitor tacrolimus level - check tomorrow morning- send out lab so will take some time to result  Sherlon Handing, PharmD, BCPS Please see amion for complete clinical pharmacist phone list 12/18/2020,4:25 PM

## 2020-12-18 NOTE — Progress Notes (Signed)
ANTICOAGULATION CONSULT NOTE - Follow Up Consult  Pharmacy Consult for heparin Indication: DVT  Allergies  Allergen Reactions  . Digoxin Other (See Comments)    gynecomastia   . Phencyclidine Other (See Comments)    PCP derived antibiotic > Insomnia  . Spironolactone Other (See Comments)    Gynecomastia  . Oxybutynin Chloride     Other reaction(s): low BP  . Elemental Sulfur Rash  . Lactose Intolerance (Gi) Diarrhea and Other (See Comments)    cramps  . Penicillins Other (See Comments)    Cramps in stomach Did it involve swelling of the face/tongue/throat, SOB, or low BP? No Did it involve sudden or severe rash/hives, skin peeling, or any reaction on the inside of your mouth or nose? No Did you need to seek medical attention at a hospital or doctor's office? No When did it last happen?unknown If all above answers are "NO", may proceed with cephalosporin use.       Patient Measurements: Height: 6\' 2"  (188 cm) Weight: 104.7 kg (230 lb 13.2 oz) IBW/kg (Calculated) : 82.2 Heparin Dosing Weight: 104 kg  Vital Signs: Temp: 98.2 F (36.8 C) (01/18 0441) Temp Source: Oral (01/18 0441) BP: 101/67 (01/18 0441) Pulse Rate: 86 (01/18 0441)  Labs: Recent Labs    12/16/20 0857 12/16/20 1942 12/17/20 0319 12/17/20 1017 12/17/20 1800 12/18/20 0409  HGB 14.3  --  14.5  --   --  13.9  HCT 44.1  --  43.8  --   --  41.8  PLT 472*  --  443*  --   --  360  HEPARINUNFRC 0.87*   < >  --  0.20* 0.36 0.16*  CREATININE 4.68*  --  5.26*  --   --  4.56*   < > = values in this interval not displayed.    Estimated Creatinine Clearance: 22.5 mL/min (A) (by C-G formula based on SCr of 4.56 mg/dL (H)).   Medications:  Scheduled:  . allopurinol  200 mg Oral Daily  . artificial tears  1 application Both Eyes QHS  . azelastine  1 spray Each Nare Daily   And  . fluticasone  1 spray Each Nare Daily  . cetirizine  10 mg Oral QHS  . Chlorhexidine Gluconate Cloth  6 each Topical  Daily  . Chlorhexidine Gluconate Cloth  6 each Topical Q0600  . dexamethasone  4 mg Oral BID WC  . fentaNYL  2 patch Transdermal Q72H  . fentaNYL  1 patch Transdermal Q72H  . lactulose  10 g Oral BID  . levothyroxine  125 mcg Oral Daily  . metoCLOPramide  5 mg Oral TID AC & HS  . polyvinyl alcohol  1 drop Both Eyes TID  . senna-docusate  3 tablet Oral BID  . sodium zirconium cyclosilicate  10 g Oral BID  . Tacrolimus ER  1.5 mg Oral Daily  . traZODone  100 mg Oral QHS   Infusions:  . heparin 950 Units/hr (12/18/20 0447)    Assessment: 60 yo M continues on heparin for acute DVT.   - 1/6: IR guided right retroperitoneal lymph node biopsy. Retroperitoneal hemorrhage noted on post-biopsy CT - 1/10: LE doppler- acute DVT   Heparin was previously therapeutic on heparin 950 units/hr. Pt went to dialysis yesterday. Heparin level this morning is SUBtherapeutic at 0.16. Verified with RN that there have been no issues with the heparin infusion and that it is still running. No bleeding noted.   Of note: Patient to receive HD  again today.  Goal of Therapy:  Heparin level 0.3-0.7 units/ml Monitor platelets by anticoagulation protocol: Yes   Plan:  Increase heparin to 1100 units/hr. Check anti-Xa level 8 hours after heparin restarted and daily while on heparin Continue to monitor H&H and platelets    Thank you for allowing [place] to participate in this patients care.   Jens Som, PharmD Please see amion for complete clinical pharmacist phone list. 12/18/2020 8:15 AM

## 2020-12-19 ENCOUNTER — Encounter (HOSPITAL_COMMUNITY): Payer: Self-pay | Admitting: Hematology

## 2020-12-19 LAB — CBC
HCT: 38.1 % — ABNORMAL LOW (ref 39.0–52.0)
Hemoglobin: 13.3 g/dL (ref 13.0–17.0)
MCH: 32.5 pg (ref 26.0–34.0)
MCHC: 34.9 g/dL (ref 30.0–36.0)
MCV: 93.2 fL (ref 80.0–100.0)
Platelets: 366 10*3/uL (ref 150–400)
RBC: 4.09 MIL/uL — ABNORMAL LOW (ref 4.22–5.81)
RDW: 15.8 % — ABNORMAL HIGH (ref 11.5–15.5)
WBC: 26.8 10*3/uL — ABNORMAL HIGH (ref 4.0–10.5)
nRBC: 0.1 % (ref 0.0–0.2)

## 2020-12-19 LAB — C3 COMPLEMENT: C3 Complement: 161 mg/dL (ref 82–167)

## 2020-12-19 LAB — HEPARIN LEVEL (UNFRACTIONATED)
Heparin Unfractionated: 0.45 IU/mL (ref 0.30–0.70)
Heparin Unfractionated: 0.35 IU/mL (ref 0.30–0.70)

## 2020-12-19 LAB — C4 COMPLEMENT: Complement C4, Body Fluid: 62 mg/dL — ABNORMAL HIGH (ref 12–38)

## 2020-12-19 LAB — BASIC METABOLIC PANEL
Anion gap: 15 (ref 5–15)
BUN: 117 mg/dL — ABNORMAL HIGH (ref 6–20)
CO2: 20 mmol/L — ABNORMAL LOW (ref 22–32)
Calcium: 8.5 mg/dL — ABNORMAL LOW (ref 8.9–10.3)
Chloride: 90 mmol/L — ABNORMAL LOW (ref 98–111)
Creatinine, Ser: 4.78 mg/dL — ABNORMAL HIGH (ref 0.61–1.24)
GFR, Estimated: 13 mL/min — ABNORMAL LOW (ref >60)
Glucose, Bld: 118 mg/dL — ABNORMAL HIGH (ref 70–99)
Potassium: 5.4 mmol/L — ABNORMAL HIGH (ref 3.5–5.1)
Sodium: 125 mmol/L — ABNORMAL LOW (ref 135–145)

## 2020-12-19 LAB — ANCA TITERS
Atypical P-ANCA titer: <1:20 {titer}
C-ANCA: <1:20 {titer}
P-ANCA: <1:20 {titer}

## 2020-12-19 LAB — ANTI-DNA ANTIBODY, DOUBLE-STRANDED: ds DNA Ab: <1 IU/mL (ref 0–9)

## 2020-12-19 LAB — HEPATITIS B SURFACE ANTIBODY, QUANTITATIVE: Hep B S AB Quant (Post): <3.1 m[IU]/mL — ABNORMAL LOW (ref >9.9)

## 2020-12-19 LAB — RHEUMATOID FACTOR: Rheumatoid fact SerPl-aCnc: <10 IU/mL (ref <14.0)

## 2020-12-19 LAB — GLOMERULAR BASEMENT MEMBRANE ANTIBODIES: GBM Ab: 3 units (ref 0–20)

## 2020-12-19 MED ORDER — ALBUMIN HUMAN 25 % IV SOLN: 12.5000 g | Freq: Once | INTRAVENOUS | Status: AC

## 2020-12-19 MED ORDER — ALBUMIN HUMAN 25 % IV SOLN
INTRAVENOUS | Status: AC
  Administered 2020-12-19: 25 g via INTRAVENOUS
  Filled 2020-12-19: qty 100

## 2020-12-19 MED ORDER — HEPARIN SODIUM (PORCINE) 1000 UNIT/ML IJ SOLN
INTRAMUSCULAR | Status: AC
  Administered 2020-12-19: 3200 [IU]
  Filled 2020-12-19: qty 4

## 2020-12-19 MED ORDER — DEXAMETHASONE 4 MG PO TABS
4.0000 mg | ORAL_TABLET | Freq: Every day | ORAL | Status: DC
  Administered 2020-12-19 – 2020-12-22 (×4): 4 mg via ORAL
  Filled 2020-12-19 (×6): qty 1

## 2020-12-19 NOTE — Progress Notes (Signed)
ANTICOAGULATION CONSULT NOTE - Follow Up Consult  Pharmacy Consult for heparin Indication: DVT  Allergies  Allergen Reactions  . Digoxin Other (See Comments)    gynecomastia   . Phencyclidine Other (See Comments)    PCP derived antibiotic > Insomnia  . Spironolactone Other (See Comments)    Gynecomastia  . Oxybutynin Chloride     Other reaction(s): low BP  . Elemental Sulfur Rash  . Lactose Intolerance (Gi) Diarrhea and Other (See Comments)    cramps  . Penicillins Other (See Comments)    Cramps in stomach Did it involve swelling of the face/tongue/throat, SOB, or low BP? No Did it involve sudden or severe rash/hives, skin peeling, or any reaction on the inside of your mouth or nose? No Did you need to seek medical attention at a hospital or doctor's office? No When did it last happen?unknown If all above answers are "NO", may proceed with cephalosporin use.       Patient Measurements: Height: 6\' 2"  (188 cm) Weight: 103.2 kg (227 lb 8.2 oz) IBW/kg (Calculated) : 82.2 Heparin Dosing Weight: 104 kg  Vital Signs: Temp: 97.5 F (36.4 C) (01/19 1100) Temp Source: Oral (01/19 1100) BP: 114/86 (01/19 1100) Pulse Rate: 80 (01/19 0606)  Labs: Recent Labs    12/17/20 0319 12/17/20 1017 12/18/20 0409 12/18/20 1703 12/19/20 0720  HGB 14.5  --  13.9  --  13.3  HCT 43.8  --  41.8  --  38.1*  PLT 443*  --  360  --  366  HEPARINUNFRC  --    < > 0.16* 0.16* 0.45  CREATININE 5.26*  --  4.56*  --  4.78*   < > = values in this interval not displayed.    Estimated Creatinine Clearance: 21.3 mL/min (A) (by C-G formula based on SCr of 4.78 mg/dL (H)).   Medications:  Scheduled:  . allopurinol  200 mg Oral Daily  . artificial tears  1 application Both Eyes QHS  . azelastine  1 spray Each Nare Daily   And  . fluticasone  1 spray Each Nare Daily  . cetirizine  10 mg Oral QHS  . Chlorhexidine Gluconate Cloth  6 each Topical Daily  . Chlorhexidine Gluconate Cloth  6  each Topical Q0600  . dexamethasone  4 mg Oral Q breakfast  . fentaNYL  1 patch Transdermal Q72H  . lactulose  10 g Oral BID  . levothyroxine  125 mcg Oral Daily  . metoCLOPramide  5 mg Oral TID AC & HS  . polyvinyl alcohol  1 drop Both Eyes TID  . senna-docusate  3 tablet Oral BID  . sodium zirconium cyclosilicate  10 g Oral BID  . Tacrolimus ER  1.5 mg Oral Daily  . traZODone  100 mg Oral QHS   Infusions:  . heparin 1,400 Units/hr (12/18/20 1825)    Assessment: 60 yo M continues on heparin for acute DVT.   - 1/6: IR guided right retroperitoneal lymph node biopsy. Retroperitoneal hemorrhage noted on post-biopsy CT - 1/10: LE doppler- acute DVT   Heparin level this morning is therapeutic at 0.45.  No bleeding noted.   Of note: Patient to receiving HD today.  Goal of Therapy:  Heparin level 0.3-0.7 units/ml Monitor platelets by anticoagulation protocol: Yes   Plan:  Continue heparin 1400 units/hr. Check -8 hour anti-Xa level to confirm therapeutic Check anti-Xa level and CBC daily while on heparin Continue to monitor H&H and platelets  Thank you for allowing [place] to  participate in this patients care.  Nicole Cella, RPh Clinical Pharmacist Please see AMION for all Pharmacists' Contact Phone Numbers 12/19/2020, 11:35 AM

## 2020-12-19 NOTE — Plan of Care (Signed)
  Problem: Nutritional: Goal: Maintenance of adequate nutrition will improve Outcome: Progressing   Problem: Elimination: Goal: Will not experience complications related to urinary retention Outcome: Not Progressing

## 2020-12-19 NOTE — Progress Notes (Signed)
Paged provider regarding bladder scan showed 426 ml and patient concerned my be more and backing up into kidneys.

## 2020-12-19 NOTE — Progress Notes (Signed)
ANTICOAGULATION CONSULT NOTE - Follow Up Consult  Pharmacy Consult for IV Heparin Indication: DVT  Allergies  Allergen Reactions  . Digoxin Other (See Comments)    gynecomastia   . Phencyclidine Other (See Comments)    PCP derived antibiotic > Insomnia  . Spironolactone Other (See Comments)    Gynecomastia  . Oxybutynin Chloride     Other reaction(s): low BP  . Elemental Sulfur Rash  . Lactose Intolerance (Gi) Diarrhea and Other (See Comments)    cramps  . Penicillins Other (See Comments)    Cramps in stomach Did it involve swelling of the face/tongue/throat, SOB, or low BP? No Did it involve sudden or severe rash/hives, skin peeling, or any reaction on the inside of your mouth or nose? No Did you need to seek medical attention at a hospital or doctor's office? No When did it last happen?unknown If all above answers are "NO", may proceed with cephalosporin use.       Patient Measurements: Height: 6\' 2"  (188 cm) Weight: 103.2 kg (227 lb 8.2 oz) IBW/kg (Calculated) : 82.2 Heparin Dosing Weight: 104 kg  Vital Signs: Temp: 97.5 F (36.4 C) (01/19 1302) Temp Source: Oral (01/19 1302) BP: 91/64 (01/19 1302) Pulse Rate: 94 (01/19 1302)  Labs: Recent Labs    12/17/20 0319 12/17/20 1017 12/18/20 0409 12/18/20 1703 12/19/20 0720 12/19/20 1507  HGB 14.5  --  13.9  --  13.3  --   HCT 43.8  --  41.8  --  38.1*  --   PLT 443*  --  360  --  366  --   HEPARINUNFRC  --    < > 0.16* 0.16* 0.45 0.35  CREATININE 5.26*  --  4.56*  --  4.78*  --    < > = values in this interval not displayed.    Estimated Creatinine Clearance: 21.3 mL/min (A) (by C-G formula based on SCr of 4.78 mg/dL (H)).   Assessment: 60 yr old man with medical hx significant for Hodgkin lymphoma, cardiac transplant (on tacrolimus/prednisone), combined systolic/diastolic HF, high-grade T1 bladder cancer with recent hospitalization for AKI complicated by hydronephrosis in setting of new  retroperitoneal lymphadenopathy continues on heparin for acute RLE DVT  - 1/6: IR guided right retroperitoneal lymph node biopsy Retroperitoneal hemorrhage noted on post-biopsy CT - 1/10: LE doppler- acute DVT   Confirmatory heparin level drawn ~7.5 hrs after initial therapeutic heparin level earlier today remains within the goal range for this pt at 0.35 units/ml. H/H 13.3/38.1, plt 366. Per RN, no issues with IV or bleeding observed.  1/17: tunneled HD catheter placed by IR, initiated HD; also had HD today.  Goal of Therapy:  Heparin level 0.3-0.7 units/ml Monitor platelets by anticoagulation protocol: Yes   Plan:  Continue heparin infusion at 1400 units/hr Monitor daily heparin level, CBC Monitor for bleeding  Thank you for allowing [place] to participate in this patients care.  Gillermina Hu, PharmD, BCPS, [facility] Clinical Pharmacist 12/19/2020, 4:44 PM

## 2020-12-19 NOTE — Procedures (Signed)
I was present at this dialysis session. I have reviewed the session itself and made appropriate changes.   HD#2.  2K bath 1.5L UF via R IJ Tunneled HD cath.    Today he tells me he is open to all / full scope of care, including HD.    WIll start outpt CLIP as AKI.  Next HD 1/21    Filed Weights   12/17/20 1235 12/18/20 0445 12/19/20 0745  Weight: 102.9 kg 104.7 kg 104.2 kg    Recent Labs  Lab 12/19/20 0720  NA 125*  K 5.4*  CL 90*  CO2 20*  GLUCOSE 118*  BUN 117*  CREATININE 4.78*  CALCIUM 8.5*    Recent Labs  Lab 12/17/20 0319 12/18/20 0409 12/19/20 0720  WBC 22.1* 24.1* 26.8*  HGB 14.5 13.9 13.3  HCT 43.8 41.8 38.1*  MCV 94.0 93.9 93.2  PLT 443* 360 366    Scheduled Meds: . allopurinol  200 mg Oral Daily  . artificial tears  1 application Both Eyes QHS  . azelastine  1 spray Each Nare Daily   And  . fluticasone  1 spray Each Nare Daily  . cetirizine  10 mg Oral QHS  . Chlorhexidine Gluconate Cloth  6 each Topical Daily  . Chlorhexidine Gluconate Cloth  6 each Topical Q0600  . dexamethasone  4 mg Oral Q breakfast  . fentaNYL  1 patch Transdermal Q72H  . lactulose  10 g Oral BID  . levothyroxine  125 mcg Oral Daily  . metoCLOPramide  5 mg Oral TID AC & HS  . polyvinyl alcohol  1 drop Both Eyes TID  . senna-docusate  3 tablet Oral BID  . sodium zirconium cyclosilicate  10 g Oral BID  . Tacrolimus ER  1.5 mg Oral Daily  . traZODone  100 mg Oral QHS   Continuous Infusions: . heparin 1,400 Units/hr (12/18/20 1825)   PRN Meds:.acetaminophen **OR** acetaminophen, bisacodyl, HYDROmorphone (DILAUDID) injection, magnesium hydroxide, ondansetron **OR** ondansetron (ZOFRAN) IV, pantoprazole, prochlorperazine   Pearson Grippe  MD 12/19/2020, 9:22 AM

## 2020-12-19 NOTE — Progress Notes (Signed)
         [facility]                                                                              Patient Demographics  Glenn Gonzalez, is a 59 y.o. male, DOB - 09/25/1961, MRN:[id number]  Admit date - 12/21/2020   Admitting Physician Shayla D Nettey, MD  Outpatient Primary MD for the patient is Stoneking, Hal, MD  Outpatient specialists:   LOS - 12  days   Medical records reviewed and are as summarized below:    No chief complaint on file.      Brief summary  Glenn Gonzalezis a 59 y.o.year old malewith medical history significant for Hodgkin lymphoma, cardiac transplant on tacrolimus and prednisone, chronic combined systolic/diastolic CHF, GERD, high-grade T1 bladder cancer with recent hospitalization from 12/30-1/3 for AKI complicated by hydronephrosis in the setting of new retroperitoneal lymphadenopathy. Patient underwent stent placement by urology and was treated for postoperative multifocal pneumonia.  He presented from urology office with reports of increased back pain not well controlled on oral pain medication as well as generalized fatigue, poor appetite. He was brought in under observation due to concern for back pain in the setting of known retroperitoneal lymphadenopathy.  Hospital course complicated by acute hyponatremia in the setting of poor appetite requiring IV fluids and persistent leukocytosis. CT abdomen imaging consistent with retroperitoneal adenopathy and dilation of right renal pelvis and collecting systems concerning for urothelial neoplasm with extensive metastatic process. Found to have acute right lower extremity DVT on venous duplex started on heparin on 1/10. Has opted for dialysis while awaiting [facility] response.  1/17: Tunneled dialysis cath placed by IR, initiated hemodialysis, first 1/17, tolerated 1/18: second hemodialysis planned  Assessment & Plan    Principal Problem: Retroperitoneal adenopathy and perinephric nodularity,  high-grade bladder CA, recently diagnosed -Status post IR guided retroperitoneal biopsy, pathology positive for poorly differentiated carcinoma.  Recent hospitalization 12/30-1/3 for AKI complicated by hydronephrosis in the setting of new retroperitoneal LAD, underwent stent placement. - Urology and oncology following. - Oncology consulted, sent for molecular testing and hoping to discuss with [facility] oncology regarding patient's options --Startedon palliative chemo 1/14, palliative medicine following - Per oncology, patient will be due for C1D8 Padcev chemo on 1/21 if still inpatient.  AKI on CKD stage 3a, now ? dialysis dependent, new -Baseline creatinine 1.2-1.4 from 08/2020, 1.8 from last hospitalization -Suspect lymphatic obstruction due to malignancy, low albumin, renal function worsening over the last few weeks.  Nephrology was consulted. - Lasix was added for volume overload though not effective.  2D echo showed normal EF - 1/17: IR consulted and tunneled HD cath placed, initiated on HD, tolerated -1/18: Tolerated hemodialysis, 1/19 undergoing hemodialysis without any issues  Hyperkalemia -Was placed on Lokelma twice daily, now on HD, nephrology following  Acute right lower extremity DVT -Continue IV heparin, H&H stable  Small retroperitoneal hemorrhage status post biopsy -H&H stable  Acute on chronic hyponatremia -Initially improved with IV fluid and thought to be due to diminished p.o. intake but then patient was volume overloaded -Started on hemodialysis on 1/17, Na currently stable at 125  Small pericardial effusion -Incidentally noted on echocardiogram,   without tamponade physiology  Chronic combined systolic and diastolic heart failure, history of cardiac transplant -Patient is status post heart transplant, prior EF 10% - EF has now corrected to 60 to 65% -Volume control with HD, initiated on 1/17  History of cardiac transplant -Continue tacrolimus, to keep goal  levels between 5-8 -Continue dexamethasone, reduced dose to 4 mg daily by Dr. Kale on 1/18 and okay to wean down to his baseline prednisone 5 mg daily -Cardiology was consulted, per Dr Bensimhon, EF is normal, recommended to continue the immunosuppressive regimen and follow the tacrolimus levels. Dr. Bensimhon has notified/updated the transplant team at [facility].   Hypothyroidism -Continue Synthroid  Code Status: DNR DVT Prophylaxis: IV heparin Family Communication: Discussed all imaging results, lab results, explained to the patient.  Called patient's wife, Ms. Lisa Choudhry on phone #[phone] and updated in detail today 1/19  Disposition Plan:     Status is: Inpatient  Remains inpatient appropriate because:Inpatient level of care appropriate due to severity of illness   Dispo: The patient is from: Home              Anticipated d/c is to: Home              Anticipated d/c date is: > 3 days              Patient currently is not medically stable to d/c. Initiated on HD, has fluid overload, multiple electrolyte abnormalities, not stable for discharge.      Time Spent in minutes   35 minutes  Procedures:  HD cath 1.17 IR guided biopsy 1/06  Consultants:   Nephrology Oncology Palliative IR  Antimicrobials:   Anti-infectives (From admission, onward)   Start     Dose/Rate Route Frequency Ordered Stop   12/17/20 0830  vancomycin (VANCOCIN) IVPB 1000 mg/200 mL premix        1,000 mg 200 mL/hr over 60 Minutes Intravenous To Radiology 12/17/20 0744 12/17/20 0932   12/30/2020 2200  cefdinir (OMNICEF) capsule 300 mg        300 mg Oral 2 times daily 12/12/2020 1716 12/10/20 2159         Medications  Scheduled Meds: . allopurinol  200 mg Oral Daily  . artificial tears  1 application Both Eyes QHS  . azelastine  1 spray Each Nare Daily   And  . fluticasone  1 spray Each Nare Daily  . cetirizine  10 mg Oral QHS  . Chlorhexidine Gluconate Cloth  6 each Topical Daily  .  Chlorhexidine Gluconate Cloth  6 each Topical Q0600  . dexamethasone  4 mg Oral Q breakfast  . fentaNYL  1 patch Transdermal Q72H  . lactulose  10 g Oral BID  . levothyroxine  125 mcg Oral Daily  . metoCLOPramide  5 mg Oral TID AC & HS  . polyvinyl alcohol  1 drop Both Eyes TID  . senna-docusate  3 tablet Oral BID  . sodium zirconium cyclosilicate  10 g Oral BID  . Tacrolimus ER  1.5 mg Oral Daily  . traZODone  100 mg Oral QHS   Continuous Infusions: . heparin 1,400 Units/hr (12/18/20 1825)   PRN Meds:.acetaminophen **OR** acetaminophen, bisacodyl, HYDROmorphone (DILAUDID) injection, magnesium hydroxide, ondansetron **OR** ondansetron (ZOFRAN) IV, pantoprazole, prochlorperazine      Subjective:   Glenn Gonzalez was seen and examined today.  Patient seen in hemodialysis today, much more alert oriented and pleasant.  No acute issues overnight.  Tolerating back-to-back hemodialysis.  No new   chest pain, shortness of breath or fevers.  Objective:   Vitals:   12/19/20 0900 12/19/20 1003 12/19/20 1020 12/19/20 1100  BP: 90/66 90/72 92/63 114/86  Pulse:      Resp: 15   19  Temp:    (!) 97.5 F (36.4 C)  TempSrc:    Oral  SpO2:      Weight:    103.2 kg  Height:        Intake/Output Summary (Last 24 hours) at 12/19/2020 1202 Last data filed at 12/19/2020 1100 Gross per 24 hour  Intake 845.43 ml  Output 1434 ml  Net -588.57 ml     Wt Readings from Last 3 Encounters:  12/19/20 103.2 kg  11/29/20 100.2 kg  08/31/20 101.2 kg    Physical Exam  General: Alert and oriented x 3, NAD  Cardiovascular: S1 S2 clear, RRR.  1-2+ pedal edema b/l  Respiratory: CTAB, no wheezing, rales or rhonchi. TDC in upper right chest wall  Gastrointestinal: Soft, nontender, nondistended, NBS  Ext: 1-2+ pedal edema bilaterally  Neuro: no new deficits  Musculoskeletal: No cyanosis, clubbing  Skin: No rashes  Psych: Normal affect and demeanor, alert and oriented x3       Data  Reviewed:  I have personally reviewed following labs and imaging studies  Micro Results No results found for this or any previous visit (from the past 240 hour(s)).  Radiology Reports CT ABDOMEN PELVIS WO CONTRAST  Result Date: 12/09/2020 CLINICAL DATA:  Cancer of unknown primary in this 60 year old male. EXAM: CT CHEST, ABDOMEN AND PELVIS WITHOUT CONTRAST TECHNIQUE: Multidetector CT imaging of the chest, abdomen and pelvis was performed following the standard protocol without IV contrast. COMPARISON:  CT abdomen and pelvis of the same date. Also with CT of December 30th of 2021 FINDINGS: CT CHEST FINDINGS Cardiovascular: Calcified atheromatous plaque in the thoracic aorta. Heart size is normal following sternotomy for heart transplant by report a [facility]. Three-vessel coronary artery calcification. No pericardial effusion. Calcifications in the LEFT atrium partially seen on previous imaging are of uncertain significance central pulmonary vasculature also with some signs of calcification. Mediastinum/Nodes: Ovoid structure associated with LEFT thyroid 11 mm similar to previous imaging, cervical spine from 2017. Thyroid as described above. Calcifications in the LEFT supraclavicular region may be within venous structures on the LEFT. No axillary lymphadenopathy. Surgical clips in the RIGHT axilla and soft tissue thickening over the LEFT pectoralis musculature similar grossly compared to remote MRI evaluation 2018 and surgical clips seen on numerous priors in the RIGHT axilla. No mediastinal lymphadenopathy. No gross hilar lymphadenopathy. Lungs/Pleura: Small RIGHT of pleural effusion and basilar airspace disease. Small nodule in the superior aspect of the RIGHT middle lobe approximately 3 mm on image 57 of series 5. Biapical scarring. Small nodule in the LEFT upper lobe 6 mm (image 51, series 5) airways are patent. Musculoskeletal: Evidence ir sternotomy without significant bony bridging  but with sclerotic margins along the sternotomy line similar to studies dating back to August of 2021 with respect to visualized portions on prior abdomen studies no destructive bone finding or acute bone process. CT ABDOMEN PELVIS FINDINGS Hepatobiliary: 1.9 cm lesion in the RIGHT hepatic lobe near the dome of the RIGHT hemi liver does not display attenuation values of simple cyst is unchanged from the study acquired on the same date at another facility. Additionally a 1.4 cm lesion in the RIGHT hepatic lobe on image 23 of series 2 is unchanged. Sludge in the gallbladder.  Tiny low-density foci in the LEFT hepatic lobe without change 1 on image 19 of series 2 in the lateral segment measuring 5 mm and another in the medial segment of the LEFT hepatic lobe measuring 12 mm on image 21 of series 2 these areas appears similar dating back to August of 2020, lesions in the RIGHT hepatic lobe are new. Pancreas: Normal pancreatic contour without signs of inflammation. Spleen: Post splenectomy. Adrenals/Urinary Tract: Adrenal glands, normal on the LEFT and obscured on the RIGHT by perinephric and Peri adrenal nodularity. Fullness of the RIGHT renal pelvis with nephroureteral stent in place showing soft tissue density with distension of the renal pelvis and calices similar to the study acquired on the same date. Nephrolithiasis also unchanged. Perinephric nodularity along the medial upper pole the LEFT kidney measures 6.4 x 2.7 cm which is within 1-2 mm of its previous measurement when measured by this observer on the prior study. Increased in size when compared to the study of November 30, 2020. Renal cortical scarring on the LEFT with nephrolithiasis in the lower pole unchanged from very recent comparison. Urinary bladder with small amount of gas in the urinary bladder with distal aspect of the stent in place, loop coiled in the urinary bladder. Stomach/Bowel: Scattered colonic diverticulosis without acute gastrointestinal  process. Normal appendix. Stool and contrast throughout the colon. Vascular/Lymphatic: Calcified atheromatous plaque in the abdominal aorta without aneurysmal dilation. Retroperitoneal adenopathy unchanged from scan earlier the same day, largest lymph nodes behind the inferior vena cava and in the intra-aortocaval groove and in the upper abdomen, largest on image 41 of series 2 measuring 1.9 cm. Smaller lymph nodes along the LEFT periaortic chain. Postoperative changes in the retroperitoneum associated with previous lymphadenectomy No pelvic lymphadenopathy. Reproductive: Prostate with uro lift device deployment bilaterally similar to recent priors. Other: Extensive retroperitoneal fascial thickening/stranding extending into the root of the small bowel mesentery, nodular changes associated with this thickening on the RIGHT outlining the renal fascia this crosses the midline where it is without significant nodularity but with fascial thickening and stranding, also tracking into the pelvis. Small amount of fluid in the pelvis. Musculoskeletal: Spinal degenerative changes. No destructive bone process or acute bone finding. IMPRESSION: 1. Retroperitoneal adenopathy and perinephric nodularity with dilation of the RIGHT renal pelvis and collecting systems, filled with what is suspected to represent neoplasm. 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 Differential considerations given heart transplantation and presumed immunosuppression would include posttransplant lymphoproliferative disorder/lymphoma. 2. Dense material in collecting systems may represent a mixture of tumor and or blood and there is profound dilation of collecting systems of the RIGHT kidney. This is unchanged compared to the recent comparison study. 3. Signs of hepatic involvement with new lesions in the RIGHT hepatic lobe. 4. Ascites with small amount of  ascites with very subtle infiltration of fat in the omentum not mentioned above, for instance on image 50 of series 2 raising the question of peritoneal involvement as well. This area is in the range of 2-3 mm. 5. Small RIGHT pleural effusion and basilar airspace disease. 6. Postoperative changes about the chest likely related to prior heart transplantation with surgical clips in the RIGHT axilla and thickening and calcification along the anterior pectoralis musculature not changed since 2018. 7. Calcification in the LEFT atrium also about the aortic root and pulmonary artery and to a lesser degree in the RIGHT atrium likely reflects changes of vascular anastomotic sites in   the setting of heart transplantation. 8. 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 9. Post splenectomy. 10. Aortic atherosclerosis. 11. Small nodule in the LEFT upper lobe 6 mm airways are patent. These results will be called to the ordering clinician or representative by the Radiologist Assistant, and communication documented in the PACS or [facility] Dashboard. Aortic Atherosclerosis (ICD10-I70.0). Electronically Signed   By: Geoffrey  Wile M.D.   On: 12/27/2020 17:38   DG Chest 1 View  Result Date: 11/29/2020 CLINICAL DATA:  Hypoxia EXAM: CHEST  1 VIEW COMPARISON:  08/03/2020 FINDINGS: Since the prior examination, there has developed right basilar consolidation, possibly infectious in the appropriate clinical setting. Mild focal infiltrate is also noted within the right apex. Left lung is clear. No pneumothorax or pleural effusion. Median sternotomy has been performed. Cardiac size within normal limits. Multiple healed right rib fractures are noted. Multiple surgical clips are seen within the right axilla. IMPRESSION: Interval development of multifocal pulmonary infiltrate within the right lung, more focal within the right lung base, suspicious for atypical  infection in the acute setting. Electronically Signed   By: Ashesh  Parikh MD   On: 11/29/2020 22:27   DG Chest 2 View  Result Date: 12/28/2020 CLINICAL DATA:  Bladder cancer.  Follow-up multifocal pneumonia. EXAM: CHEST - 2 VIEW COMPARISON:  11/29/2020 FINDINGS: Prior median sternotomy. Heart is normal size. Patchy right lung airspace disease in the right apex and right lower lung. Minimal left base linear atelectasis or scarring. Overall aeration in the right lung slightly improved. No effusions. IMPRESSION: Patchy right lung airspace disease with slight improvement since prior study. Left base atelectasis or scarring. Electronically Signed   By: Kevin  Dover M.D.   On: 12/06/2020 13:25   CT CHEST WO CONTRAST  Result Date: 12/17/2020 CLINICAL DATA:  Cancer of unknown primary in this 59-year-old male. EXAM: CT CHEST, ABDOMEN AND PELVIS WITHOUT CONTRAST TECHNIQUE: Multidetector CT imaging of the chest, abdomen and pelvis was performed following the standard protocol without IV contrast. COMPARISON:  CT abdomen and pelvis of the same date. Also with CT of December 30th of 2021 FINDINGS: CT CHEST FINDINGS Cardiovascular: Calcified atheromatous plaque in the thoracic aorta. Heart size is normal following sternotomy for heart transplant by report a [facility]. Three-vessel coronary artery calcification. No pericardial effusion. Calcifications in the LEFT atrium partially seen on previous imaging are of uncertain significance central pulmonary vasculature also with some signs of calcification. Mediastinum/Nodes: Ovoid structure associated with LEFT thyroid 11 mm similar to previous imaging, cervical spine from 2017. Thyroid as described above. Calcifications in the LEFT supraclavicular region may be within venous structures on the LEFT. No axillary lymphadenopathy. Surgical clips in the RIGHT axilla and soft tissue thickening over the LEFT pectoralis musculature similar grossly compared to  remote MRI evaluation 2018 and surgical clips seen on numerous priors in the RIGHT axilla. No mediastinal lymphadenopathy. No gross hilar lymphadenopathy. Lungs/Pleura: Small RIGHT of pleural effusion and basilar airspace disease. Small nodule in the superior aspect of the RIGHT middle lobe approximately 3 mm on image 57 of series 5. Biapical scarring. Small nodule in the LEFT upper lobe 6 mm (image 51, series 5) airways are patent. Musculoskeletal: Evidence ir sternotomy without significant bony bridging but with sclerotic margins along the sternotomy line similar to studies dating back to August of 2021 with respect to visualized portions on prior abdomen studies no destructive bone finding or acute bone process.   CT ABDOMEN PELVIS FINDINGS Hepatobiliary: 1.9 cm lesion in the RIGHT hepatic lobe near the dome of the RIGHT hemi liver does not display attenuation values of simple cyst is unchanged from the study acquired on the same date at another facility. Additionally a 1.4 cm lesion in the RIGHT hepatic lobe on image 23 of series 2 is unchanged. Sludge in the gallbladder. Tiny low-density foci in the LEFT hepatic lobe without change 1 on image 19 of series 2 in the lateral segment measuring 5 mm and another in the medial segment of the LEFT hepatic lobe measuring 12 mm on image 21 of series 2 these areas appears similar dating back to August of 2020, lesions in the RIGHT hepatic lobe are new. Pancreas: Normal pancreatic contour without signs of inflammation. Spleen: Post splenectomy. Adrenals/Urinary Tract: Adrenal glands, normal on the LEFT and obscured on the RIGHT by perinephric and Peri adrenal nodularity. Fullness of the RIGHT renal pelvis with nephroureteral stent in place showing soft tissue density with distension of the renal pelvis and calices similar to the study acquired on the same date. Nephrolithiasis also unchanged. Perinephric nodularity along the medial upper pole the LEFT kidney measures 6.4 x  2.7 cm which is within 1-2 mm of its previous measurement when measured by this observer on the prior study. Increased in size when compared to the study of November 30, 2020. Renal cortical scarring on the LEFT with nephrolithiasis in the lower pole unchanged from very recent comparison. Urinary bladder with small amount of gas in the urinary bladder with distal aspect of the stent in place, loop coiled in the urinary bladder. Stomach/Bowel: Scattered colonic diverticulosis without acute gastrointestinal process. Normal appendix. Stool and contrast throughout the colon. Vascular/Lymphatic: Calcified atheromatous plaque in the abdominal aorta without aneurysmal dilation. Retroperitoneal adenopathy unchanged from scan earlier the same day, largest lymph nodes behind the inferior vena cava and in the intra-aortocaval groove and in the upper abdomen, largest on image 41 of series 2 measuring 1.9 cm. Smaller lymph nodes along the LEFT periaortic chain. Postoperative changes in the retroperitoneum associated with previous lymphadenectomy No pelvic lymphadenopathy. Reproductive: Prostate with uro lift device deployment bilaterally similar to recent priors. Other: Extensive retroperitoneal fascial thickening/stranding extending into the root of the small bowel mesentery, nodular changes associated with this thickening on the RIGHT outlining the renal fascia this crosses the midline where it is without significant nodularity but with fascial thickening and stranding, also tracking into the pelvis. Small amount of fluid in the pelvis. Musculoskeletal: Spinal degenerative changes. No destructive bone process or acute bone finding. IMPRESSION: 1. Retroperitoneal adenopathy and perinephric nodularity with dilation of the RIGHT renal pelvis and collecting systems, filled with what is suspected to represent neoplasm. Constellation of findings is highly concerning for upper tract urothelial neoplasm with extensive metastatic  process in the retroperitoneum. Differential considerations given heart transplantation and presumed immunosuppression would include posttransplant lymphoproliferative disorder/lymphoma. 2. Dense material in collecting systems may represent a mixture of tumor and or blood and there is profound dilation of collecting systems of the RIGHT kidney. This is unchanged compared to the recent comparison study. 3. Signs of hepatic involvement with new lesions in the RIGHT hepatic lobe. 4. Ascites with small amount of ascites with very subtle infiltration of fat in the omentum not mentioned above, for instance on image 50 of series 2 raising the question of peritoneal involvement as well. This area is in the range of 2-3 mm. 5. Small RIGHT pleural effusion and basilar airspace   disease. 6. Postoperative changes about the chest likely related to prior heart transplantation with surgical clips in the RIGHT axilla and thickening and calcification along the anterior pectoralis musculature not changed since 2018. 7. Calcification in the LEFT atrium also about the aortic root and pulmonary artery and to a lesser degree in the RIGHT atrium likely reflects changes of vascular anastomotic sites in the setting of heart transplantation. 8. 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 9. Post splenectomy. 10. Aortic atherosclerosis. 11. Small nodule in the LEFT upper lobe 6 mm airways are patent. These results will be called to the ordering clinician or representative by the Radiologist Assistant, and communication documented in the PACS or [facility] Dashboard. Aortic Atherosclerosis (ICD10-I70.0). Electronically Signed   By: Geoffrey  Wile M.D.   On: 12/20/2020 17:38   NM Pulmonary Perfusion  Result Date: 12/04/2020 CLINICAL DATA:  Respiratory failure EXAM: NUCLEAR MEDICINE PERFUSION LUNG SCAN TECHNIQUE: Perfusion images were obtained in multiple projections  after intravenous injection of radiopharmaceutical. Ventilation scans intentionally deferred if perfusion scan and chest x-ray adequate for interpretation during COVID 19 epidemic. RADIOPHARMACEUTICALS:  4.1 mCi Tc-99m MAA IV COMPARISON:  Chest x-ray 12/03/2020 FINDINGS: Slightly heterogeneous distribution of radiotracer within the lung fields. Multiple small bilateral wedge-shaped perfusion defects. No corresponding radiographic abnormality. No large mismatched segmental perfusion defect. IMPRESSION: Intermediate probability for pulmonary embolism. Electronically Signed   By: Nicholas  Plundo D.O.   On: 12/04/2020 13:30   IR Fluoro Guide CV Line Left  Result Date: 12/17/2020 INDICATION: 59-year-old male referred for hemodialysis catheter placement EXAM: TUNNELED CENTRAL VENOUS HEMODIALYSIS CATHETER PLACEMENT WITH ULTRASOUND AND FLUOROSCOPIC GUIDANCE MEDICATIONS: 1 g vancomycin. The antibiotic was given in an appropriate time interval prior to skin puncture. ANESTHESIA/SEDATION: No moderate sedation. A total of Versed 0.5 mg and Fentanyl 0 mcg was administered intravenously. Moderate Sedation Time: 0 minutes. The patient's level of consciousness and vital signs were monitored continuously by radiology nursing throughout the procedure under my direct supervision. FLUOROSCOPY TIME:  Fluoroscopy Time: 0 minutes 54 seconds (3 mGy). COMPLICATIONS: None PROCEDURE: Informed written consent was obtained from the patient after a discussion of the risks, benefits, and alternatives to treatment. Questions regarding the procedure were encouraged and answered. The right neck and chest were prepped with chlorhexidine in a sterile fashion, and a sterile drape was applied covering the operative field. Maximum barrier sterile technique with sterile gowns and gloves were used for the procedure. A timeout was performed prior to the initiation of the procedure. Ultrasound survey was performed. Micropuncture kit was utilized to  access the right internal jugular vein under direct, real-time ultrasound guidance after the overlying soft tissues were anesthetized with 1% lidocaine with epinephrine. Stab incision was made with 11 blade scalpel. Microwire was passed centrally. The microwire was then marked to measure appropriate internal catheter length. External tunneled length was estimated. A total tip to cuff length of 19 cm was selected. 035 guidewire was advanced to the level of the IVC. Skin and subcutaneous tissues of chest wall below the clavicle were generously infiltrated with 1% lidocaine for local anesthesia. A small stab incision was made with 11 blade scalpel. The selected hemodialysis catheter was tunneled in a retrograde fashion from the anterior chest wall to the venotomy incision. Serial dilation was performed and then a peel-away sheath was placed. The catheter was then placed through the peel-away sheath with tips ultimately positioned within the superior aspect of the right atrium.   Final catheter positioning was confirmed and documented with a spot radiographic image. The catheter aspirates and flushes normally. The catheter was flushed with appropriate volume heparin dwells. The catheter exit site was secured with a 0-Prolene retention suture. Gel-Foam slurry was infused into the soft tissue tract. The venotomy incision was closed Derma bond and sterile dressing. Dressings were applied at the chest wall. Patient tolerated the procedure well and remained hemodynamically stable throughout. No complications were encountered and no significant blood loss encountered. IMPRESSION: Status post right IJ tunneled hemodialysis catheter placement. Catheter ready for use. Signed, Dulcy Fanny. Dellia Nims, RPVI [facility] Radiology Electronically Signed   By: Corrie Mckusick D.O.   On: 12/17/2020 15:26   IR US Guide Vasc Access Left  Result Date: 12/17/2020 INDICATION: 60 year old male  referred for hemodialysis catheter placement EXAM: TUNNELED CENTRAL VENOUS HEMODIALYSIS CATHETER PLACEMENT WITH ULTRASOUND AND FLUOROSCOPIC GUIDANCE MEDICATIONS: 1 g vancomycin. The antibiotic was given in an appropriate time interval prior to skin puncture. ANESTHESIA/SEDATION: No moderate sedation. A total of Versed 0.5 mg and Fentanyl 0 mcg was administered intravenously. Moderate Sedation Time: 0 minutes. The patient's level of consciousness and vital signs were monitored continuously by radiology nursing throughout the procedure under my direct supervision. FLUOROSCOPY TIME:  Fluoroscopy Time: 0 minutes 54 seconds (3 mGy). COMPLICATIONS: None PROCEDURE: Informed written consent was obtained from the patient after a discussion of the risks, benefits, and alternatives to treatment. Questions regarding the procedure were encouraged and answered. The right neck and chest were prepped with chlorhexidine in a sterile fashion, and a sterile drape was applied covering the operative field. Maximum barrier sterile technique with sterile gowns and gloves were used for the procedure. A timeout was performed prior to the initiation of the procedure. Ultrasound survey was performed. Micropuncture kit was utilized to access the right internal jugular vein under direct, real-time ultrasound guidance after the overlying soft tissues were anesthetized with 1% lidocaine with epinephrine. Stab incision was made with 11 blade scalpel. Microwire was passed centrally. The microwire was then marked to measure appropriate internal catheter length. External tunneled length was estimated. A total tip to cuff length of 19 cm was selected. 035 guidewire was advanced to the level of the IVC. Skin and subcutaneous tissues of chest wall below the clavicle were generously infiltrated with 1% lidocaine for local anesthesia. A small stab incision was made with 11 blade scalpel. The selected hemodialysis catheter was tunneled in a retrograde  fashion from the anterior chest wall to the venotomy incision. Serial dilation was performed and then a peel-away sheath was placed. The catheter was then placed through the peel-away sheath with tips ultimately positioned within the superior aspect of the right atrium. Final catheter positioning was confirmed and documented with a spot radiographic image. The catheter aspirates and flushes normally. The catheter was flushed with appropriate volume heparin dwells. The catheter exit site was secured with a 0-Prolene retention suture. Gel-Foam slurry was infused into the soft tissue tract. The venotomy incision was closed Derma bond and sterile dressing. Dressings were applied at the chest wall. Patient tolerated the procedure well and remained hemodynamically stable throughout. No complications were encountered and no significant blood loss encountered. IMPRESSION: Status post right IJ tunneled hemodialysis catheter placement. Catheter ready for use. Signed, Dulcy Fanny. Dellia Nims, RPVI [facility] Inter[facility] Radiology Special[facility] Radiology Electronically Signed   By: Corrie Mckusick D.O.   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 ANESTHESIA/SEDATION: Fentanyl 100 mcg IV; Versed 4 mg IV Sedation time: 14 minutes; The patient was continuously monitored during the procedure by the interventional radiology nurse under my direct supervision. CONTRAST:  None. COMPLICATIONS: SIR Level A - No therapy, no consequence. Retroperitoneal hemorrhage at site of biopsy, controlled with Gel-Foam slurry administration along needle track. PROCEDURE: Informed consent was obtained from the patient following an explanation of the procedure, risks, benefits and alternatives. A time out was performed  prior to the initiation of the procedure. The patient was positioned in a partial left lateral decubitus position on the CT table and a limited CT was performed for procedural planning demonstrating similar appearing multifocal right retroperitoneal lymphadenopathy. The procedure was planned. The operative site was prepped and draped in the usual sterile fashion. Appropriate trajectory was confirmed with a 22 gauge spinal needle after the adjacent tissues were anesthetized with 1% Lidocaine with epinephrine. Under intermittent CT guidance, a 17 gauge coaxial needle was advanced into the peripheral aspect of the mass. Appropriate positioning was confirmed and a total of 4 samples were obtained with an 18 gauge core needle biopsy device. A limited CT demonstrated focal hemorrhage about the biopsied lymph node. Gel-Foam slurry was injected through the introducer needle along the needle track. The co-axial needle was removed and hemostasis was achieved with manual compression. Additional limited postprocedural CT was negative for expanding hemorrhage or additional complication. A dressing was placed. The patient tolerated the procedure well without immediate postprocedural complication. IMPRESSION: Technically successful CT guided core needle biopsy of right retroperitoneal lymph node. Ruthann Cancer, MD [facility] Inter[facility] Radiology Specialists [facility] Radiology Electronically Signed   By: Ruthann Cancer MD   On: 12/07/2020 08:53   DG CHEST PORT 1 VIEW  Result Date: 12/10/2020 CLINICAL DATA:  Hypoxia.  History of cardiac transplant EXAM: PORTABLE CHEST 1 VIEW COMPARISON:  12/04/2020 FINDINGS: Prior median sternotomy. Heart size within normal limits. Atherosclerotic calcification of the aortic knob. Subtle right lower lobe opacity, slightly improved from prior. Trace right pleural effusion. No pneumothorax. IMPRESSION: 1. Subtle right lower lobe opacity, slightly improved from prior. 2. Trace right pleural  effusion. Electronically Signed   By: Davina Poke D.O.   On: 12/10/2020 10:33   DG C-Arm 1-60 Min-No Report  Result Date: 11/29/2020 Fluoroscopy was utilized by the requesting physician.  No radiographic interpretation.   ECHOCARDIOGRAM COMPLETE  Result Date: 11/30/2020    ECHOCARDIOGRAM REPORT   Patient Name:   MARS SCHEAFFER Mcminn Date of Exam: 11/30/2020 Medical Rec #:  [id number]         Height:       74.0 in Accession #:    [id number]        Weight:       221.0 lb Date of Birth:  October 15, 1961        BSA:          2.268 m Patient Age:    59 years          BP:           103/59 mmHg Patient Gender: M                 HR:           94 bpm. Exam Location:  Inpatient Procedure: 2D Echo, Cardiac Doppler, Color Doppler and Intracardiac  Opacification Agent Indications:    Dyspnea R06.00  History:        Patient has prior history of Echocardiogram examinations, most                 recent 05/12/2017. Arrythmias:Atrial Fibrillation; Risk                 Factors:Non-Smoker. GERD. 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  Sonographer:    Julia Swaim RDCS Referring Phys: [id number] BRADLEY S CHOTINER  Sonographer Comments: Suboptimal apical window and suboptimal subcostal window. Pt unable to turn on side due to herniated discs. IMPRESSIONS  1. Left ventricular ejection fraction, by estimation, is 60 to 65%. The left ventricle has normal function. The left ventricle has no regional wall motion abnormalities. Left ventricular diastolic parameters were normal.  2. Right ventricular systolic function is normal. The right ventricular size is normal. There is normal pulmonary artery systolic pressure. The estimated right ventricular systolic pressure is 25.8 mmHg.  3. The mitral valve is normal in structure. No evidence of mitral valve regurgitation. No evidence of mitral stenosis.  4. The aortic valve is normal in structure. Aortic valve regurgitation is not visualized. No aortic stenosis is present.  5. The inferior vena  cava is normal in size with greater than 50% respiratory variability, suggesting right atrial pressure of 3 mmHg. FINDINGS  Left Ventricle: Left ventricular ejection fraction, by estimation, is 60 to 65%. The left ventricle has normal function. The left ventricle has no regional wall motion abnormalities. Definity contrast agent was given IV to delineate the left ventricular  endocardial borders. The left ventricular internal cavity size was normal in size. There is no left ventricular hypertrophy. Left ventricular diastolic parameters were normal. Normal left ventricular filling pressure. Right Ventricle: The right ventricular size is normal. No increase in right ventricular wall thickness. Right ventricular systolic function is normal. There is normal pulmonary artery systolic pressure. The tricuspid regurgitant velocity is 2.39 m/s, and  with an assumed right atrial pressure of 3 mmHg, the estimated right ventricular systolic pressure is 25.8 mmHg. Left Atrium: Left atrial size was normal in size. Right Atrium: Right atrial size was normal in size. Pericardium: There is no evidence of pericardial effusion. Mitral Valve: The mitral valve is normal in structure. No evidence of mitral valve regurgitation. No evidence of mitral valve stenosis. Tricuspid Valve: The tricuspid valve is normal in structure. Tricuspid valve regurgitation is mild . No evidence of tricuspid stenosis. Aortic Valve: The aortic valve is normal in structure. Aortic valve regurgitation is not visualized. No aortic stenosis is present. Pulmonic Valve: The pulmonic valve was normal in structure. Pulmonic valve regurgitation is not visualized. No evidence of pulmonic stenosis. Aorta: The aortic root is normal in size and structure. Venous: The inferior vena cava is normal in size with greater than 50% respiratory variability, suggesting right atrial pressure of 3 mmHg. IAS/Shunts: No atrial level shunt detected by color flow Doppler.  LEFT  VENTRICLE PLAX 2D LVIDd:         4.40 cm     Diastology LVIDs:         3.20 cm     LV e' medial:    10.00 cm/s LV PW:         0.90 cm     LV E/e' medial:  7.5 LV IVS:        0.90 cm     LV e' lateral:   11.30 cm/s LVOT diam:     2.50 cm       LV E/e' lateral: 6.7 LV SV:         48 LV SV Index:   21 LVOT Area:     4.91 cm  LV Volumes (MOD) LV vol d, MOD A4C: 80.2 ml LV vol s, MOD A4C: 30.4 ml LV SV MOD A4C:     80.2 ml LEFT ATRIUM           Index LA diam:      2.70 cm 1.19 cm/m LA Vol (A4C): 16.3 ml 7.19 ml/m  AORTIC VALVE LVOT Vmax:   50.20 cm/s LVOT Vmean:  36.700 cm/s LVOT VTI:    0.099 m  AORTA Ao Root diam: 3.40 cm MITRAL VALVE               TRICUSPID VALVE MV Area (PHT): 5.38 cm    TR Peak grad:   22.8 mmHg MV Decel Time: 141 msec    TR Vmax:        239.00 cm/s MV E velocity: 75.40 cm/s MV A velocity: 36.00 cm/s  SHUNTS MV E/A ratio:  2.09        Systemic VTI:  0.10 m                            Systemic Diam: 2.50 cm Dani Gobble Croitoru MD Electronically signed by Sanda Klein MD Signature Date/Time: 11/30/2020/12:30:40 PM    Final    VAS [place] LOWER EXTREMITY VENOUS (DVT)  Result Date: 12/10/2020  Lower Venous DVT Study Indications: Pain.  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 Comparison Study: Previous 12/02/20 Negative Performing Technologist: Vonzell Schlatter RVT  Examination Guidelines: A complete evaluation includes B-mode imaging, spectral Doppler, color Doppler, and power Doppler as needed of all accessible portions of each vessel. Bilateral testing is considered an integral part of a complete examination. Limited examinations for reoccurring indications may be performed as noted. The reflux portion of the exam is performed with the patient in reverse Trendelenburg.  +---------+---------------+---------+-----------+----------+--------------+ RIGHT    CompressibilityPhasicitySpontaneityPropertiesThrombus Aging  +---------+---------------+---------+-----------+----------+--------------+ CFV      Full           Yes      Yes                                 +---------+---------------+---------+-----------+----------+--------------+ SFJ      Full                                                        +---------+---------------+---------+-----------+----------+--------------+ FV Prox  Full                                                        +---------+---------------+---------+-----------+----------+--------------+ FV Mid   Full                                                        +---------+---------------+---------+-----------+----------+--------------+  FV DistalFull                                                        +---------+---------------+---------+-----------+----------+--------------+ PFV      Full                                                        +---------+---------------+---------+-----------+----------+--------------+ POP      Full           Yes      Yes                                 +---------+---------------+---------+-----------+----------+--------------+ PTV      None                                                        +---------+---------------+---------+-----------+----------+--------------+ PERO     None                                                        +---------+---------------+---------+-----------+----------+--------------+   +---------+---------------+---------+-----------+----------+--------------+ LEFT     CompressibilityPhasicitySpontaneityPropertiesThrombus Aging +---------+---------------+---------+-----------+----------+--------------+ CFV      Full           Yes      Yes                                 +---------+---------------+---------+-----------+----------+--------------+ SFJ      Full                                                         +---------+---------------+---------+-----------+----------+--------------+ FV Prox  Full                                                        +---------+---------------+---------+-----------+----------+--------------+ FV Mid   Full                                                        +---------+---------------+---------+-----------+----------+--------------+ FV DistalFull                                                        +---------+---------------+---------+-----------+----------+--------------+  PFV      Full                                                        +---------+---------------+---------+-----------+----------+--------------+ POP      Full           Yes      Yes                                 +---------+---------------+---------+-----------+----------+--------------+ PTV      Full                                                        +---------+---------------+---------+-----------+----------+--------------+ PERO     Full                                                        +---------+---------------+---------+-----------+----------+--------------+     Summary: RIGHT: - Findings consistent with acute deep vein thrombosis involving one right posterior tibial vein, and both right peroneal veins. - No cystic structure found in the popliteal fossa.  LEFT: - There is no evidence of deep vein thrombosis in the lower extremity.  - No cystic structure found in the popliteal fossa.  *See table(s) above for measurements and observations. Electronically signed by Thomas Hawken on 12/10/2020 at 7:16:00 PM.    Final    VAS US LOWER EXTREMITY VENOUS (DVT)  Result Date: 12/03/2020  Lower Venous DVT Study Indications: Pain.  Comparison Study: Previous 12/2019 Performing Technologist: Lisa Gibson RVT  Examination Guidelines: A complete evaluation includes B-mode imaging, spectral Doppler, color Doppler, and power Doppler as needed of all accessible portions  of each vessel. Bilateral testing is considered an integral part of a complete examination. Limited examinations for reoccurring indications may be performed as noted. The reflux portion of the exam is performed with the patient in reverse Trendelenburg.  +---------+---------------+---------+-----------+----------+--------------+ RIGHT    CompressibilityPhasicitySpontaneityPropertiesThrombus Aging +---------+---------------+---------+-----------+----------+--------------+ CFV      Full           Yes      Yes                                 +---------+---------------+---------+-----------+----------+--------------+ SFJ      Full                                                        +---------+---------------+---------+-----------+----------+--------------+ FV Prox  Full                                                        +---------+---------------+---------+-----------+----------+--------------+   FV Mid   Full                                                        +---------+---------------+---------+-----------+----------+--------------+ FV DistalFull                                                        +---------+---------------+---------+-----------+----------+--------------+ PFV      Full                                                        +---------+---------------+---------+-----------+----------+--------------+ POP      Full           Yes      Yes                                 +---------+---------------+---------+-----------+----------+--------------+ PTV      Full                                                        +---------+---------------+---------+-----------+----------+--------------+ PERO     Full                                                        +---------+---------------+---------+-----------+----------+--------------+   +---------+---------------+---------+-----------+----------+--------------+ LEFT      CompressibilityPhasicitySpontaneityPropertiesThrombus Aging +---------+---------------+---------+-----------+----------+--------------+ CFV      Full           Yes      Yes                                 +---------+---------------+---------+-----------+----------+--------------+ SFJ      Full                                                        +---------+---------------+---------+-----------+----------+--------------+ FV Prox  Full                                                        +---------+---------------+---------+-----------+----------+--------------+ FV Mid   Full                                                        +---------+---------------+---------+-----------+----------+--------------+   FV DistalFull                                                        +---------+---------------+---------+-----------+----------+--------------+ PFV      Full                                                        +---------+---------------+---------+-----------+----------+--------------+ POP      Full           Yes      Yes                                 +---------+---------------+---------+-----------+----------+--------------+ PTV      Full                                                        +---------+---------------+---------+-----------+----------+--------------+ PERO     Full                                                        +---------+---------------+---------+-----------+----------+--------------+     Summary: RIGHT: - There is no evidence of deep vein thrombosis in the lower extremity.  - No cystic structure found in the popliteal fossa.  LEFT: - There is no evidence of deep vein thrombosis in the lower extremity.  - No cystic structure found in the popliteal fossa.  *See table(s) above for measurements and observations. Electronically signed by Ruta Hinds MD on 12/03/2020 at 12:15:41 PM.    Final    ECHOCARDIOGRAM  LIMITED  Result Date: 12/10/2020    ECHOCARDIOGRAM LIMITED REPORT   Patient Name:   Glenn Gonzalez Date of Exam: 12/10/2020 Medical Rec #:  [id number]         Height:       74.0 in Accession #:    [id number]        Weight:       230.4 lb Date of Birth:  10/20/1961        BSA:          2.308 m Patient Age:    15 years          BP:           105/72 mmHg Patient Gender: M                 HR:           99 bpm. Exam Location:  Inpatient Procedure: Limited Echo, Cardiac Doppler and Color Doppler Indications:    Acute DVT  History:        Patient has prior history of Echocardiogram examinations, most                 recent 11/30/2020. Arrythmias:Atrial Fibrillation. CKD. GERD.  S/P heart transplant 01/22/16.  Sonographer:    Arthur Guy RDCS (AE) Referring Phys: [id number] SHAYLA D NETTEY  Sonographer Comments: Technically difficult study due to poor echo windows, suboptimal apical window and suboptimal subcostal window. Unable to roll patient due to herniated discs. IMPRESSIONS  1. S/P Heart Transplant. Left ventricular ejection fraction, by estimation, is 60 to 65%. The left ventricle has normal function. Left ventricular endocardial border not optimally defined to evaluate regional wall motion. There is moderate concentric left ventricular hypertrophy. Left ventricular diastolic function could not be evaluated.  2. Right ventricular systolic function is moderately reduced. The right ventricular size is mildly enlarged. Tricuspid regurgitation signal is inadequate for assessing PA pressure.  3. A small pericardial effusion is present. The pericardial effusion is surrounding the apex.  4. Tricuspid valve regurgitation is mild to moderate. FINDINGS  Left Ventricle: S/P Heart Transplant. Left ventricular ejection fraction, by estimation, is 60 to 65%. The left ventricle has normal function. Left ventricular endocardial border not optimally defined to evaluate regional wall motion. There is moderate concentric  left ventricular hypertrophy. Left ventricular diastolic function could not be evaluated. Right Ventricle: The right ventricular size is mildly enlarged. Right ventricular systolic function is moderately reduced. Tricuspid regurgitation signal is inadequate for assessing PA pressure. Pericardium: A small pericardial effusion is present. The pericardial effusion is surrounding the apex. Tricuspid Valve: Tricuspid valve regurgitation is mild to moderate. Venous: The inferior vena cava was not well visualized. LEFT VENTRICLE PLAX 2D LVIDd:         3.60 cm LVIDs:         2.30 cm LV PW:         1.40 cm LV IVS:        1.50 cm LVOT diam:     2.50 cm LVOT Area:     4.91 cm  LEFT ATRIUM         Index LA diam:    2.80 cm 1.21 cm/m   AORTA Ao Root diam: 3.70 cm Ao Asc diam:  3.10 cm TRICUSPID VALVE TR Peak grad:   52.4 mmHg TR Vmax:        362.00 cm/s  SHUNTS Systemic Diam: 2.50 cm Traci Turner MD Electronically signed by Traci Turner MD Signature Date/Time: 12/10/2020/3:35:06 PM    Final     Lab Data:  CBC: Recent Labs  Lab 12/15/20 0755 12/16/20 0857 12/17/20 0319 12/18/20 0409 12/19/20 0720  WBC 19.9* 23.5* 22.1* 24.1* 26.8*  HGB 14.1 14.3 14.5 13.9 13.3  HCT 41.7 44.1 43.8 41.8 38.1*  MCV 94.1 95.9 94.0 93.9 93.2  PLT 472* 472* 443* 360 366   Basic Metabolic Panel: Recent Labs  Lab 12/15/20 0755 12/16/20 0857 12/17/20 0319 12/18/20 0409 12/19/20 0720  NA 125* 123* 122* 125* 125*  K 5.4* 5.9* 5.9* 5.2* 5.4*  CL 90* 86* 87* 89* 90*  CO2 19* 20* 18* 21* 20*  GLUCOSE 122* 138* 122* 105* 118*  BUN 87* 105* 120* 100* 117*  CREATININE 3.51* 4.68* 5.26* 4.56* 4.78*  CALCIUM 9.2 8.9 8.6* 8.6* 8.5*   GFR: Estimated Creatinine Clearance: 21.3 mL/min (A) (by C-G formula based on SCr of 4.78 mg/dL (H)). Liver Function Tests: No results for input(s): AST, ALT, ALKPHOS, BILITOT, PROT, ALBUMIN in the last 168 hours. No results for input(s): LIPASE, AMYLASE in the last 168 hours. No results for  input(s): AMMONIA in the last 168 hours. Coagulation Profile: No results for input(s): INR, PROTIME in the last 168 hours. Cardiac Enzymes:   No results for input(s): CKTOTAL, CKMB, CKMBINDEX, TROPONINI in the last 168 hours. BNP (last 3 results) No results for input(s): PROBNP in the last 8760 hours. HbA1C: No results for input(s): HGBA1C in the last 72 hours. CBG: No results for input(s): GLUCAP in the last 168 hours. Lipid Profile: No results for input(s): CHOL, HDL, LDLCALC, TRIG, CHOLHDL, LDLDIRECT in the last 72 hours. Thyroid Function Tests: No results for input(s): TSH, T4TOTAL, FREET4, T3FREE, THYROIDAB in the last 72 hours. Anemia Panel: No results for input(s): VITAMINB12, FOLATE, FERRITIN, TIBC, IRON, RETICCTPCT in the last 72 hours. Urine analysis:    Component Value Date/Time   COLORURINE YELLOW 12/15/2020 1557   APPEARANCEUR CLOUDY (A) 12/15/2020 1557   LABSPEC 1.011 12/15/2020 1557   PHURINE 5.0 12/15/2020 1557   GLUCOSEU NEGATIVE 12/15/2020 1557   HGBUR LARGE (A) 12/15/2020 1557   HGBUR negative 05/12/2008 0957   BILIRUBINUR NEGATIVE 12/15/2020 [place] 12/15/2020 1557   PROTEINUR 30 (A) 12/15/2020 1557   UROBILINOGEN 0.2 07/31/2015 2311   NITRITE NEGATIVE 12/15/2020 1557   LEUKOCYTESUR TRACE (A) 12/15/2020 1557     Kjuan Seipp M.D. [facility] 12/19/2020, 12:02 PM   Call night coverage person covering after 7pm

## 2020-12-20 ENCOUNTER — Inpatient Hospital Stay (HOSPITAL_COMMUNITY): Payer: 59

## 2020-12-20 DIAGNOSIS — Z515 Encounter for palliative care: Secondary | ICD-10-CM | POA: Diagnosis not present

## 2020-12-20 DIAGNOSIS — G893 Neoplasm related pain (acute) (chronic): Secondary | ICD-10-CM | POA: Diagnosis not present

## 2020-12-20 DIAGNOSIS — C7910 Secondary malignant neoplasm of unspecified urinary organs: Secondary | ICD-10-CM | POA: Diagnosis not present

## 2020-12-20 DIAGNOSIS — N183 Chronic kidney disease, stage 3 unspecified: Secondary | ICD-10-CM

## 2020-12-20 LAB — CBC
HCT: 37.6 % — ABNORMAL LOW (ref 39.0–52.0)
Hemoglobin: 12.4 g/dL — ABNORMAL LOW (ref 13.0–17.0)
MCH: 31.2 pg (ref 26.0–34.0)
MCHC: 33 g/dL (ref 30.0–36.0)
MCV: 94.5 fL (ref 80.0–100.0)
Platelets: 328 10*3/uL (ref 150–400)
RBC: 3.98 MIL/uL — ABNORMAL LOW (ref 4.22–5.81)
RDW: 15.8 % — ABNORMAL HIGH (ref 11.5–15.5)
WBC: 25.7 10*3/uL — ABNORMAL HIGH (ref 4.0–10.5)
nRBC: 0.2 % (ref 0.0–0.2)

## 2020-12-20 LAB — BASIC METABOLIC PANEL
Anion gap: 14 (ref 5–15)
BUN: 84 mg/dL — ABNORMAL HIGH (ref 6–20)
CO2: 21 mmol/L — ABNORMAL LOW (ref 22–32)
Calcium: 8.4 mg/dL — ABNORMAL LOW (ref 8.9–10.3)
Chloride: 93 mmol/L — ABNORMAL LOW (ref 98–111)
Creatinine, Ser: 4.04 mg/dL — ABNORMAL HIGH (ref 0.61–1.24)
GFR, Estimated: 16 mL/min — ABNORMAL LOW (ref >60)
Glucose, Bld: 127 mg/dL — ABNORMAL HIGH (ref 70–99)
Potassium: 4.8 mmol/L (ref 3.5–5.1)
Sodium: 128 mmol/L — ABNORMAL LOW (ref 135–145)

## 2020-12-20 LAB — HEPARIN LEVEL (UNFRACTIONATED)
Heparin Unfractionated: 0.24 IU/mL — ABNORMAL LOW (ref 0.30–0.70)
Heparin Unfractionated: 0.69 IU/mL (ref 0.30–0.70)

## 2020-12-20 LAB — ANTINUCLEAR ANTIBODIES, IFA: ANA Ab, IFA: NEGATIVE

## 2020-12-20 MED ORDER — BISACODYL 5 MG PO TBEC
5.0000 mg | DELAYED_RELEASE_TABLET | Freq: Once | ORAL | Status: AC
  Administered 2020-12-20: 5 mg via ORAL
  Filled 2020-12-20: qty 1

## 2020-12-20 MED ORDER — POLYETHYLENE GLYCOL 3350 17 G PO PACK
17.0000 g | PACK | Freq: Once | ORAL | Status: AC
  Administered 2020-12-20: 17 g via ORAL
  Filled 2020-12-20: qty 1

## 2020-12-20 NOTE — Plan of Care (Signed)
  Problem: Health Behavior/Discharge Planning: Goal: Ability to manage health-related needs will improve Outcome: Progressing   Problem: Pain Managment: Goal: General experience of comfort will improve Outcome: Progressing   Problem: Education: Goal: Knowledge of the prescribed therapeutic regimen will improve Outcome: Progressing   Problem: Nutritional: Goal: Maintenance of adequate nutrition will improve Outcome: Progressing

## 2020-12-20 NOTE — Progress Notes (Signed)
Subjective: Patient reports patient reports continued abdominal discomfort flank discomfort. I reviewed patient's renal ultrasound showing mild dilation of the collecting system on the right which is expected with indwelling stent.  I also reviewed KUB which shows the right JJ stent has not migrated and is in similar position to when it was placed on 11/29/2020.  Objective: Vital signs in last 24 hours: Temp:  [98.1 F (36.7 C)-98.3 F (36.8 C)] 98.3 F (36.8 C) (01/20 0859) Pulse Rate:  [80-89] 81 (01/20 0859) Resp:  [16-17] 17 (01/20 0859) BP: (88-105)/(60-68) 88/61 (01/20 0859) SpO2:  [94 %-95 %] 94 % (01/20 0859)  Intake/Output from previous day: 01/19 0701 - 01/20 0700 In: 1290 [P.O.:1290] Out: 1684 [Urine:500] Intake/Output this shift: Total I/O In: 360 [P.O.:360] Out: 0   Physical Exam:  General:cachectic, fatigued, no distress and pale  Male genitalia: not done   Lab Results: Recent Labs    12/18/20 0409 12/19/20 0720 12/20/20 0527  HGB 13.9 13.3 12.4*  HCT 41.8 38.1* 37.6*   BMET Recent Labs    12/19/20 0720 12/20/20 0527  NA 125* 128*  K 5.4* 4.8  CL 90* 93*  CO2 20* 21*  GLUCOSE 118* 127*  BUN 117* 84*  CREATININE 4.78* 4.04*  CALCIUM 8.5* 8.4*   No results for input(s): LABPT, INR in the last 72 hours. No results for input(s): LABURIN in the last 72 hours. Results for orders placed or performed during the hospital encounter of 12/31/2020  SARS CORONAVIRUS 2 (TAT 6-24 HRS) Nasopharyngeal Nasopharyngeal Swab     Status: None   Collection Time: 12/29/2020  1:48 PM   Specimen: Nasopharyngeal Swab  Result Value Ref Range Status   SARS Coronavirus 2 NEGATIVE NEGATIVE Final    Comment: (NOTE) SARS-CoV-2 target nucleic acids are NOT DETECTED.  The SARS-CoV-2 RNA is generally detectable in upper and lower respiratory specimens during the acute phase of infection. Negative results do not preclude SARS-CoV-2 infection, do not rule out co-infections  with other pathogens, and should not be used as the sole basis for treatment or other patient management decisions. Negative results must be combined with clinical observations, patient history, and epidemiological information. The expected result is Negative.  Fact Sheet for Patients: SugarRoll.be  Fact Sheet for Healthcare Providers: https://www.woods-mathews.com/  This test is not yet approved or cleared by the [place] FDA and  has been authorized for detection and/or diagnosis of SARS-CoV-2 by FDA under an Emergency Use Authorization (EUA). This EUA will remain  in effect (meaning this test can be used) for the duration of the COVID-19 declaration under Se ction 564(b)(1) of the Act, 21 U.S.C. section 360bbb-3(b)(1), unless the authorization is terminated or revoked sooner.  Performed at [facility], [place] [address] [place], [place] [address]     Studies/Results: DG Abd 1 View  Result Date: 12/20/2020 CLINICAL DATA:  Ureteral stent placement EXAM: ABDOMEN - 1 VIEW COMPARISON:  CT 12/04/2020 FINDINGS: Double-J LEFT ureteral stent in place. No renal or ureteral calculi identified. Normal bowel-gas pattern. IMPRESSION: RIGHT ureteral stent in place Electronically Signed   By: Suzy Bouchard M.D.   On: 12/20/2020 15:30   US RENAL  Result Date: 12/20/2020 CLINICAL DATA:  Urinary retention. 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 EXAM: RENAL / URINARY TRACT ULTRASOUND COMPLETE COMPARISON:  CT 12/20/2020 FINDINGS: Right Kidney: Renal measurements: 14.4 x 9.2 x 7.9 cm = volume: 547 mL. Normal echogenicity. Mild right hydronephrosis, relatively similar to 12/10/2020 CT. Left Kidney: Renal measurements: 12.5 x  3.5 x 6.4 cm = volume: 231 mL. Echogenicity within normal limits. No mass or hydronephrosis visualized. Bladder: Echogenicity within the posterior bladder is likely due to the right ureteric stent. Other: Mild exam  degradation secondary to patient's tenderness. IMPRESSION: 1. Mild right-sided hydronephrosis, relatively similar to 12/02/2020 CT. 2. No other explanation for urinary retention. Electronically Signed   By: Abigail Miyamoto M.D.   On: 12/20/2020 11:18    Assessment/Plan: Metastatic urothelial carcinoma status post cystoscopy insertion of right JJ stent, JJ stent in good position based on KUB.  Ultrasound findings expected with indwelling stent.  I relayed all of these findings to the patient and wife today. Plan/recommendation.  Continue with chemo and hemodialysis.  Follow bladder ultrasound to ensure bladder is not over distended.  If residuals remain over 400 cc consider indwelling Foley for bladder decompression to maximize renal function.   LOS: 13 days   Remi Haggard 12/20/2020, 4:11 PM

## 2020-12-20 NOTE — Progress Notes (Signed)
ANTICOAGULATION CONSULT NOTE - Follow Up Consult  Pharmacy Consult for IV Heparin Indication: DVT  Allergies  Allergen Reactions  . Digoxin Other (See Comments)    gynecomastia   . Phencyclidine Other (See Comments)    PCP derived antibiotic > Insomnia  . Spironolactone Other (See Comments)    Gynecomastia  . Oxybutynin Chloride     Other reaction(s): low BP  . Elemental Sulfur Rash  . Lactose Intolerance (Gi) Diarrhea and Other (See Comments)    cramps  . Penicillins Other (See Comments)    Cramps in stomach Did it involve swelling of the face/tongue/throat, SOB, or low BP? No Did it involve sudden or severe rash/hives, skin peeling, or any reaction on the inside of your mouth or nose? No Did you need to seek medical attention at a hospital or doctor's office? No When did it last happen?unknown If all above answers are "NO", may proceed with cephalosporin use.       Patient Measurements: Height: 6\' 2"  (188 cm) Weight: 103.2 kg (227 lb 8.2 oz) IBW/kg (Calculated) : 82.2 Heparin Dosing Weight: 104 kg  Vital Signs: Temp: 98.1 F (36.7 C) (01/20 0500) Temp Source: Oral (01/20 0500) BP: 97/60 (01/20 0500) Pulse Rate: 80 (01/20 0500)  Labs: Recent Labs    12/18/20 0409 12/18/20 1703 12/19/20 0720 12/19/20 1507 12/20/20 0527  HGB 13.9  --  13.3  --  12.4*  HCT 41.8  --  38.1*  --  37.6*  PLT 360  --  366  --  328  HEPARINUNFRC 0.16*   < > 0.45 0.35 0.24*  CREATININE 4.56*  --  4.78*  --   --    < > = values in this interval not displayed.    Estimated Creatinine Clearance: 21.3 mL/min (A) (by C-G formula based on SCr of 4.78 mg/dL (H)).   Assessment: 60 yr old man with medical hx significant for Hodgkin lymphoma, cardiac transplant (on tacrolimus/prednisone), combined systolic/diastolic HF, high-grade T1 bladder cancer with recent hospitalization for AKI complicated by hydronephrosis in setting of new retroperitoneal lymphadenopathy continues on heparin  for acute RLE DVT  - 1/6: IR guided right retroperitoneal lymph node biopsy Retroperitoneal hemorrhage noted on post-biopsy CT - 1/10: LE doppler- acute DVT   Heparin level subtherapeutic (0.24) on gtt at 1400 units/hr. No issues with line or bleeding reported per RN.  Goal of Therapy:  Heparin level 0.3-0.7 units/ml Monitor platelets by anticoagulation protocol: Yes   Plan:   Increase heparin infusion to 1550 units/hr F/u 8 hr heparin level  Sherlon Handing, PharmD, BCPS Please see amion for complete clinical pharmacist phone list 12/20/2020, 6:26 AM

## 2020-12-20 NOTE — Progress Notes (Signed)
[facility] Progress Note     Assessment/ Plan:   1. Dialysis Dependent AKI on CKD 3a - b/l creat 1.2- 1.4 from oct 2021.   Renal function worsening over last few weeks. UA +wbc's and rbc's, no sig proteinuria. Imaging shows stable R ureteral stent and stable R collecting system dilatation. Has new significant leg edema. ECHO showed normal EF.   Creat 1.8 on admission > up to 2.3 then 2.8 after lasix tried, then 3.1 >  3.2 > 3.4 > up to 4.5 -- HD.  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   IR placed [facility] 1/17 R IJ, HD #1 1/17  HD #3 1/21: 3h, 2-3L UF, 300/500, 2K, Tight heparin 2. Hyperkalemia - Improved, Stop lokelma 3. Hyponatremia -  Inability to excrete free water 2/2 low GFR.  LImit PO, will correct with HD 4. High-grade bladder cancer- recent dx , treated w/ BCG in Aug 2021, sp TURBT x 2 Aug and Sept. New RP adenopathy w/ R hydro in Dec 2021 > cysto and R ureteral stent per urology. Biopsy RP mass done last week showed poorly diff urothelial carcinoma, molecular markers pending. Onc consulting w/ [facility].  Has started CTX with Dr. Irene Limbo 5. Volume excess - increase UF as able 6. H/o Hodgkin's lymphoma - remote 7. H/o cardiac transplant - taking prograf 1.5 mg /d and pred 5 /d per CV 8. Acute RLE DVT - per primary team. Positive for superficial venous thrombosis/thrombophlebitis of the mid right leg greater saphenous vein but neg for DVT. 9. Chronic diast CHF - had prior LVEF 10% but now has corrected to 60-65%   Subjective:     Tolerated HD #2 yesterday, 1.1L UF  No complaints this morning   Objective:   BP (!) 88/61 (BP Location: Right Arm)   Pulse 81   Temp 98.3 F (36.8 C)   Resp 17   Ht 6\' 2"  (1.88 m)   Wt 103.2 kg   SpO2 94%   BMI 29.21 kg/m   Intake/Output Summary (Last 24 hours) at 12/20/2020 1246 Last data filed at 12/20/2020 1000 Gross per 24 hour  Intake 1020 ml  Output  500 ml  Net 520 ml   Weight change:   Physical Exam: Genalert, not in distress No jvd or bruits Chest clear bilatto bases RRR no MRG Abdprotuberant, no ascites by exam, no hsm, +BS Ext3+ pitting bilat LE from feet to hips to abd/chest wall Neuro is alert, Ox 3 , nf  Imaging: US RENAL  Result Date: 12/20/2020 CLINICAL DATA:  Urinary retention. 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 EXAM: RENAL / URINARY TRACT ULTRASOUND COMPLETE COMPARISON:  CT 12/08/2020 FINDINGS: Right Kidney: Renal measurements: 14.4 x 9.2 x 7.9 cm = volume: 547 mL. Normal echogenicity. Mild right hydronephrosis, relatively similar to 12/27/2020 CT. Left Kidney: Renal measurements: 12.5 x 3.5 x 6.4 cm = volume: 231 mL. Echogenicity within normal limits. No mass or hydronephrosis visualized. Bladder: Echogenicity within the posterior bladder is likely due to the right ureteric stent. Other: Mild exam degradation secondary to patient's tenderness. IMPRESSION: 1. Mild right-sided hydronephrosis, relatively similar to 12/29/2020 CT. 2. No other explanation for urinary retention. Electronically Signed   By: Abigail Miyamoto M.D.   On: 12/20/2020 11:18    Labs: BMET Recent Labs  Lab 12/14/20 0717 12/15/20 0755 12/16/20 0857 12/17/20 0319 12/18/20 0409 12/19/20 0720 12/20/20 0527  NA 125* 125* 123* 122* 125*  125* 128*  K 4.9 5.4* 5.9* 5.9* 5.2* 5.4* 4.8  CL 91* 90* 86* 87* 89* 90* 93*  CO2 22 19* 20* 18* 21* 20* 21*  GLUCOSE 117* 122* 138* 122* 105* 118* 127*  BUN 74* 87* 105* 120* 100* 117* 84*  CREATININE 3.48* 3.51* 4.68* 5.26* 4.56* 4.78* 4.04*  CALCIUM 9.1 9.2 8.9 8.6* 8.6* 8.5* 8.4*   CBC Recent Labs  Lab 12/17/20 0319 12/18/20 0409 12/19/20 0720 12/20/20 0527  WBC 22.1* 24.1* 26.8* 25.7*  HGB 14.5 13.9 13.3 12.4*  HCT 43.8 41.8 38.1* 37.6*  MCV 94.0 93.9 93.2 94.5  PLT 443* 360 366 328    Medications:    . allopurinol  200 mg Oral Daily  . artificial tears  1 application Both Eyes QHS   . azelastine  1 spray Each Nare Daily   And  . fluticasone  1 spray Each Nare Daily  . cetirizine  10 mg Oral QHS  . Chlorhexidine Gluconate Cloth  6 each Topical Daily  . Chlorhexidine Gluconate Cloth  6 each Topical Q0600  . dexamethasone  4 mg Oral Q breakfast  . fentaNYL  1 patch Transdermal Q72H  . lactulose  10 g Oral BID  . levothyroxine  125 mcg Oral Daily  . metoCLOPramide  5 mg Oral TID AC & HS  . polyvinyl alcohol  1 drop Both Eyes TID  . senna-docusate  3 tablet Oral BID  . sodium zirconium cyclosilicate  10 g Oral BID  . Tacrolimus ER  1.5 mg Oral Daily  . traZODone  100 mg Oral QHS      Rexene Agent, MD  12/20/2020, 12:46 PM

## 2020-12-20 NOTE — Progress Notes (Signed)
ANTICOAGULATION CONSULT NOTE - Follow Up Consult  Pharmacy Consult for IV Heparin Indication: DVT  Allergies  Allergen Reactions  . Digoxin Other (See Comments)    gynecomastia   . Phencyclidine Other (See Comments)    PCP derived antibiotic > Insomnia  . Spironolactone Other (See Comments)    Gynecomastia  . Oxybutynin Chloride     Other reaction(s): low BP  . Elemental Sulfur Rash  . Lactose Intolerance (Gi) Diarrhea and Other (See Comments)    cramps  . Penicillins Other (See Comments)    Cramps in stomach Did it involve swelling of the face/tongue/throat, SOB, or low BP? No Did it involve sudden or severe rash/hives, skin peeling, or any reaction on the inside of your mouth or nose? No Did you need to seek medical attention at a hospital or doctor's office? No When did it last happen?unknown If all above answers are "NO", may proceed with cephalosporin use.       Patient Measurements: Height: 6\' 2"  (188 cm) Weight: 103.2 kg (227 lb 8.2 oz) IBW/kg (Calculated) : 82.2 Heparin Dosing Weight: 104 kg  Vital Signs: Temp: 98.3 F (36.8 C) (01/20 0859) Temp Source: Oral (01/20 0500) BP: 88/61 (01/20 0859) Pulse Rate: 81 (01/20 0859)  Labs: Recent Labs    12/18/20 0409 12/18/20 1703 12/19/20 0720 12/19/20 1507 12/20/20 0527 12/20/20 1555  HGB 13.9  --  13.3  --  12.4*  --   HCT 41.8  --  38.1*  --  37.6*  --   PLT 360  --  366  --  328  --   HEPARINUNFRC 0.16*   < > 0.45 0.35 0.24* 0.69  CREATININE 4.56*  --  4.78*  --  4.04*  --    < > = values in this interval not displayed.    Estimated Creatinine Clearance: 25.2 mL/min (A) (by C-G formula based on SCr of 4.04 mg/dL (H)).   Assessment: 60 yr old man with medical hx significant for Hodgkin lymphoma, cardiac transplant (on tacrolimus/prednisone), combined systolic/diastolic HF, high-grade T1 bladder cancer with recent hospitalization for AKI complicated by hydronephrosis in setting of new  retroperitoneal lymphadenopathy continues on heparin for acute RLE DVT  - 1/6: IR guided right retroperitoneal lymph node biopsy Retroperitoneal hemorrhage noted on post-biopsy CT - 1/10: LE doppler- acute DVT   Heparin level therapeutic at top of range at 0.69 after rate increase this AM. Hg down a bit to 12.4, plt wnl. No bleeding or issues with infusion per discussion with RN.  Goal of Therapy:  Heparin level 0.3-0.7 units/ml Monitor platelets by anticoagulation protocol: Yes   Plan:   Decrease heparin infusion slightly to 1500 units/hr to ensure stays in range Check confirmatory heparin level with AM labs Monitor daily CBC, s/sx bleeding   Arturo Morton, PharmD, BCPS Please check AMION for all [place] contact numbers Clinical Pharmacist 12/20/2020 4:47 PM

## 2020-12-20 NOTE — Progress Notes (Signed)
HEMATOLOGY-ONCOLOGY PROGRESS NOTE  SUBJECTIVE: Pain overall well controlled.  Completed second dialysis yesterday and tolerated well.  Scheduled again tomorrow.  REVIEW OF SYSTEMS:   Constitutional: Denies fevers, chills Eyes: Denies blurriness of vision Ears, nose, mouth, throat, and face: Denies mucositis or sore throat Respiratory: Denies cough, dyspnea or wheezes Cardiovascular: Denies palpitation, chest discomfort Gastrointestinal:  Denies nausea, heartburn or change in bowel habits Skin: Denies abnormal skin rashes Lymphatics: Denies new lymphadenopathy or easy bruising Neurological:Denies numbness, tingling or new weaknesses Behavioral/Psych: Mood is stable, no new changes  Extremities: No lower extremity edema All other systems were reviewed with the patient and are negative.  I have reviewed the past medical history, past surgical history, social history and family history with the patient and they are unchanged from previous note.   PHYSICAL EXAMINATION: ECOG PERFORMANCE STATUS: 3 - Symptomatic, >50% confined to bed  Vitals:   12/20/20 0500 12/20/20 0859  BP: 97/60 (!) 88/61  Pulse: 80 81  Resp: 16 17  Temp: 98.1 F (36.7 C) 98.3 F (36.8 C)  SpO2:  94%   Filed Weights   12/18/20 0445 12/19/20 0745 12/19/20 1100  Weight: 104.7 kg 104.2 kg 103.2 kg    Intake/Output from previous day: 01/19 0701 - 01/20 0700 In: 1290 [P.O.:1290] Out: 1684 [Urine:500]  GENERAL:alert, no distress and comfortable NEURO: alert & oriented x 3 with fluent speech, no focal motor/sensory deficits  LABORATORY DATA:  I have reviewed the data as listed CMP Latest Ref Rng & Units 12/20/2020 12/19/2020 12/18/2020  Glucose 70 - 99 mg/dL 127(H) 118(H) 105(H)  BUN 6 - 20 mg/dL 84(H) 117(H) 100(H)  Creatinine 0.61 - 1.24 mg/dL 4.04(H) 4.78(H) 4.56(H)  Sodium 135 - 145 mmol/L 128(L) 125(L) 125(L)  Potassium 3.5 - 5.1 mmol/L 4.8 5.4(H) 5.2(H)  Chloride 98 - 111 mmol/L 93(L) 90(L) 89(L)  CO2  22 - 32 mmol/L 21(L) 20(L) 21(L)  Calcium 8.9 - 10.3 mg/dL 8.4(L) 8.5(L) 8.6(L)  Total Protein 6.5 - 8.1 g/dL - - -  Total Bilirubin 0.3 - 1.2 mg/dL - - -  Alkaline Phos 38 - 126 U/L - - -  AST 15 - 41 U/L - - -  ALT 0 - 44 U/L - - -    Lab Results  Component Value Date   WBC 25.7 (H) 12/20/2020   HGB 12.4 (L) 12/20/2020   HCT 37.6 (L) 12/20/2020   MCV 94.5 12/20/2020   PLT 328 12/20/2020   NEUTROABS 16.6 (H) 12/28/2020    CT ABDOMEN PELVIS WO CONTRAST  Result Date: 12/02/2020 CLINICAL DATA:  Cancer of unknown primary in this 60 year old male. EXAM: CT CHEST, ABDOMEN AND PELVIS WITHOUT CONTRAST TECHNIQUE: Multidetector CT imaging of the chest, abdomen and pelvis was performed following the standard protocol without IV contrast. COMPARISON:  CT abdomen and pelvis of the same date. Also with CT of December 30th of 2021 FINDINGS: CT CHEST FINDINGS Cardiovascular: Calcified atheromatous plaque in the thoracic aorta. Heart size is normal following sternotomy for heart transplant by report a [facility]. Three-vessel coronary artery calcification. No pericardial effusion. Calcifications in the LEFT atrium partially seen on previous imaging are of uncertain significance central pulmonary vasculature also with some signs of calcification. Mediastinum/Nodes: Ovoid structure associated with LEFT thyroid 11 mm similar to previous imaging, cervical spine from 2017. Thyroid as described above. Calcifications in the LEFT supraclavicular region may be within venous structures on the LEFT. No axillary lymphadenopathy. Surgical clips in the RIGHT axilla and soft tissue thickening  over the LEFT pectoralis musculature similar grossly compared to remote MRI evaluation 2018 and surgical clips seen on numerous priors in the RIGHT axilla. No mediastinal lymphadenopathy. No gross hilar lymphadenopathy. Lungs/Pleura: Small RIGHT of pleural effusion and basilar airspace disease. Small nodule in the  superior aspect of the RIGHT middle lobe approximately 3 mm on image 57 of series 5. Biapical scarring. Small nodule in the LEFT upper lobe 6 mm (image 51, series 5) airways are patent. Musculoskeletal: Evidence ir sternotomy without significant bony bridging but with sclerotic margins along the sternotomy line similar to studies dating back to August of 2021 with respect to visualized portions on prior abdomen studies no destructive bone finding or acute bone process. CT ABDOMEN PELVIS FINDINGS Hepatobiliary: 1.9 cm lesion in the RIGHT hepatic lobe near the dome of the RIGHT hemi liver does not display attenuation values of simple cyst is unchanged from the study acquired on the same date at another facility. Additionally a 1.4 cm lesion in the RIGHT hepatic lobe on image 23 of series 2 is unchanged. Sludge in the gallbladder. Tiny low-density foci in the LEFT hepatic lobe without change 1 on image 19 of series 2 in the lateral segment measuring 5 mm and another in the medial segment of the LEFT hepatic lobe measuring 12 mm on image 21 of series 2 these areas appears similar dating back to August of 2020, lesions in the RIGHT hepatic lobe are new. Pancreas: Normal pancreatic contour without signs of inflammation. Spleen: Post splenectomy. Adrenals/Urinary Tract: Adrenal glands, normal on the LEFT and obscured on the RIGHT by perinephric and Peri adrenal nodularity. Fullness of the RIGHT renal pelvis with nephroureteral stent in place showing soft tissue density with distension of the renal pelvis and calices similar to the study acquired on the same date. Nephrolithiasis also unchanged. Perinephric nodularity along the medial upper pole the LEFT kidney measures 6.4 x 2.7 cm which is within 1-2 mm of its previous measurement when measured by this observer on the prior study. Increased in size when compared to the study of November 30, 2020. Renal cortical scarring on the LEFT with nephrolithiasis in the lower pole  unchanged from very recent comparison. Urinary bladder with small amount of gas in the urinary bladder with distal aspect of the stent in place, loop coiled in the urinary bladder. Stomach/Bowel: Scattered colonic diverticulosis without acute gastrointestinal process. Normal appendix. Stool and contrast throughout the colon. Vascular/Lymphatic: Calcified atheromatous plaque in the abdominal aorta without aneurysmal dilation. Retroperitoneal adenopathy unchanged from scan earlier the same day, largest lymph nodes behind the inferior vena cava and in the intra-aortocaval groove and in the upper abdomen, largest on image 41 of series 2 measuring 1.9 cm. Smaller lymph nodes along the LEFT periaortic chain. Postoperative changes in the retroperitoneum associated with previous lymphadenectomy No pelvic lymphadenopathy. Reproductive: Prostate with uro lift device deployment bilaterally similar to recent priors. Other: Extensive retroperitoneal fascial thickening/stranding extending into the root of the small bowel mesentery, nodular changes associated with this thickening on the RIGHT outlining the renal fascia this crosses the midline where it is without significant nodularity but with fascial thickening and stranding, also tracking into the pelvis. Small amount of fluid in the pelvis. Musculoskeletal: Spinal degenerative changes. No destructive bone process or acute bone finding. IMPRESSION: 1. Retroperitoneal adenopathy and perinephric nodularity with dilation of the RIGHT renal pelvis and collecting systems, filled with what is suspected to represent neoplasm. 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 Differential considerations given heart transplantation and presumed immunosuppression would include posttransplant lymphoproliferative disorder/lymphoma. 2. Dense material in collecting systems may represent a mixture of  tumor and or blood and there is profound dilation of collecting systems of the RIGHT kidney. This is unchanged compared to the recent comparison study. 3. Signs of hepatic involvement with new lesions in the RIGHT hepatic lobe. 4. Ascites with small amount of ascites with very subtle infiltration of fat in the omentum not mentioned above, for instance on image 50 of series 2 raising the question of peritoneal involvement as well. This area is in the range of 2-3 mm. 5. Small RIGHT pleural effusion and basilar airspace disease. 6. Postoperative changes about the chest likely related to prior heart transplantation with surgical clips in the RIGHT axilla and thickening and calcification along the anterior pectoralis musculature not changed since 2018. 7. Calcification in the LEFT atrium also about the aortic root and pulmonary artery and to a lesser degree in the RIGHT atrium likely reflects changes of vascular anastomotic sites in the setting of heart transplantation. 8. 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 9. Post splenectomy. 10. Aortic atherosclerosis. 11. Small nodule in the LEFT upper lobe 6 mm airways are patent. These results will be called to the ordering clinician or representative by the Radiologist Assistant, and communication documented in the PACS or [employer]. Aortic Atherosclerosis (ICD10-I70.0). Electronically Signed   By: Zetta Bills M.D.   On: 12/06/2020 17:38   DG Chest 1 View  Result Date: 11/29/2020 CLINICAL DATA:  Hypoxia EXAM: CHEST  1 VIEW COMPARISON:  08/03/2020 FINDINGS: Since the prior examination, there has developed right basilar consolidation, possibly infectious in the appropriate clinical setting. Mild focal infiltrate is also noted within the right apex. Left lung is clear. No pneumothorax or pleural effusion. Median sternotomy has been performed. Cardiac size within normal limits.  Multiple healed right rib fractures are noted. Multiple surgical clips are seen within the right axilla. IMPRESSION: Interval development of multifocal pulmonary infiltrate within the right lung, more focal within the right lung base, suspicious for atypical infection in the acute setting. Electronically Signed   By: Fidela Salisbury MD   On: 11/29/2020 22:27   DG Chest 2 View  Result Date: 12/05/2020 CLINICAL DATA:  Bladder cancer.  Follow-up multifocal pneumonia. EXAM: CHEST - 2 VIEW COMPARISON:  11/29/2020 FINDINGS: Prior median sternotomy. Heart is normal size. Patchy right lung airspace disease in the right apex and right lower lung. Minimal left base linear atelectasis or scarring. Overall aeration in the right lung slightly improved. No effusions. IMPRESSION: Patchy right lung airspace disease with slight improvement since prior study. Left base atelectasis or scarring. Electronically Signed   By: Rolm Baptise M.D.   On: 12/08/2020 13:25   CT CHEST WO CONTRAST  Result Date: 12/24/2020 CLINICAL DATA:  Cancer of unknown primary in this 60 year old male. EXAM: CT CHEST, ABDOMEN AND PELVIS WITHOUT CONTRAST TECHNIQUE: Multidetector CT imaging of the chest, abdomen and pelvis was performed following the standard protocol without IV contrast. COMPARISON:  CT abdomen and pelvis of the same date. Also with CT of December 30th of 2021 FINDINGS: CT CHEST FINDINGS Cardiovascular: Calcified atheromatous plaque in the thoracic aorta. Heart size is normal following sternotomy for heart transplant by report a [facility] center. Three-vessel coronary artery calcification. No pericardial effusion. Calcifications in the LEFT atrium partially seen  on previous imaging are of uncertain significance central pulmonary vasculature also with some signs of calcification. Mediastinum/Nodes: Ovoid structure associated with LEFT thyroid 11 mm similar to previous imaging, cervical spine from 2017. Thyroid as described  above. Calcifications in the LEFT supraclavicular region may be within venous structures on the LEFT. No axillary lymphadenopathy. Surgical clips in the RIGHT axilla and soft tissue thickening over the LEFT pectoralis musculature similar grossly compared to remote MRI evaluation 2018 and surgical clips seen on numerous priors in the RIGHT axilla. No mediastinal lymphadenopathy. No gross hilar lymphadenopathy. Lungs/Pleura: Small RIGHT of pleural effusion and basilar airspace disease. Small nodule in the superior aspect of the RIGHT middle lobe approximately 3 mm on image 57 of series 5. Biapical scarring. Small nodule in the LEFT upper lobe 6 mm (image 51, series 5) airways are patent. Musculoskeletal: Evidence ir sternotomy without significant bony bridging but with sclerotic margins along the sternotomy line similar to studies dating back to August of 2021 with respect to visualized portions on prior abdomen studies no destructive bone finding or acute bone process. CT ABDOMEN PELVIS FINDINGS Hepatobiliary: 1.9 cm lesion in the RIGHT hepatic lobe near the dome of the RIGHT hemi liver does not display attenuation values of simple cyst is unchanged from the study acquired on the same date at another facility. Additionally a 1.4 cm lesion in the RIGHT hepatic lobe on image 23 of series 2 is unchanged. Sludge in the gallbladder. Tiny low-density foci in the LEFT hepatic lobe without change 1 on image 19 of series 2 in the lateral segment measuring 5 mm and another in the medial segment of the LEFT hepatic lobe measuring 12 mm on image 21 of series 2 these areas appears similar dating back to August of 2020, lesions in the RIGHT hepatic lobe are new. Pancreas: Normal pancreatic contour without signs of inflammation. Spleen: Post splenectomy. Adrenals/Urinary Tract: Adrenal glands, normal on the LEFT and obscured on the RIGHT by perinephric and Peri adrenal nodularity. Fullness of the RIGHT renal pelvis with  nephroureteral stent in place showing soft tissue density with distension of the renal pelvis and calices similar to the study acquired on the same date. Nephrolithiasis also unchanged. Perinephric nodularity along the medial upper pole the LEFT kidney measures 6.4 x 2.7 cm which is within 1-2 mm of its previous measurement when measured by this observer on the prior study. Increased in size when compared to the study of November 30, 2020. Renal cortical scarring on the LEFT with nephrolithiasis in the lower pole unchanged from very recent comparison. Urinary bladder with small amount of gas in the urinary bladder with distal aspect of the stent in place, loop coiled in the urinary bladder. Stomach/Bowel: Scattered colonic diverticulosis without acute gastrointestinal process. Normal appendix. Stool and contrast throughout the colon. Vascular/Lymphatic: Calcified atheromatous plaque in the abdominal aorta without aneurysmal dilation. Retroperitoneal adenopathy unchanged from scan earlier the same day, largest lymph nodes behind the inferior vena cava and in the intra-aortocaval groove and in the upper abdomen, largest on image 41 of series 2 measuring 1.9 cm. Smaller lymph nodes along the LEFT periaortic chain. Postoperative changes in the retroperitoneum associated with previous lymphadenectomy No pelvic lymphadenopathy. Reproductive: Prostate with uro lift device deployment bilaterally similar to recent priors. Other: Extensive retroperitoneal fascial thickening/stranding extending into the root of the small bowel mesentery, nodular changes associated with this thickening on the RIGHT outlining the renal fascia this crosses the midline where it is without significant nodularity but with  fascial thickening and stranding, also tracking into the pelvis. Small amount of fluid in the pelvis. Musculoskeletal: Spinal degenerative changes. No destructive bone process or acute bone finding. IMPRESSION: 1. Retroperitoneal  adenopathy and perinephric nodularity with dilation of the RIGHT renal pelvis and collecting systems, filled with what is suspected to represent neoplasm. 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 Differential considerations given heart transplantation and presumed immunosuppression would include posttransplant lymphoproliferative disorder/lymphoma. 2. Dense material in collecting systems may represent a mixture of tumor and or blood and there is profound dilation of collecting systems of the RIGHT kidney. This is unchanged compared to the recent comparison study. 3. Signs of hepatic involvement with new lesions in the RIGHT hepatic lobe. 4. Ascites with small amount of ascites with very subtle infiltration of fat in the omentum not mentioned above, for instance on image 50 of series 2 raising the question of peritoneal involvement as well. This area is in the range of 2-3 mm. 5. Small RIGHT pleural effusion and basilar airspace disease. 6. Postoperative changes about the chest likely related to prior heart transplantation with surgical clips in the RIGHT axilla and thickening and calcification along the anterior pectoralis musculature not changed since 2018. 7. Calcification in the LEFT atrium also about the aortic root and pulmonary artery and to a lesser degree in the RIGHT atrium likely reflects changes of vascular anastomotic sites in the setting of heart transplantation. 8. 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 9. Post splenectomy. 10. Aortic atherosclerosis. 11. Small nodule in the LEFT upper lobe 6 mm airways are patent. These results will be called to the ordering clinician or representative by the Radiologist Assistant, and communication documented in the PACS or [employer]. Aortic Atherosclerosis (ICD10-I70.0).  Electronically Signed   By: Zetta Bills M.D.   On: 12/08/2020 17:38   NM Pulmonary Perfusion  Result Date: 12/21/2020 CLINICAL DATA:  Respiratory failure EXAM: NUCLEAR MEDICINE PERFUSION LUNG SCAN TECHNIQUE: Perfusion images were obtained in multiple projections after intravenous injection of radiopharmaceutical. Ventilation scans intentionally deferred if perfusion scan and chest x-ray adequate for interpretation during COVID 19 epidemic. RADIOPHARMACEUTICALS:  4.1 mCi Tc-26m MAA IV COMPARISON:  Chest x-ray 12/27/2020 FINDINGS: Slightly heterogeneous distribution of radiotracer within the lung fields. Multiple small bilateral wedge-shaped perfusion defects. No corresponding radiographic abnormality. No large mismatched segmental perfusion defect. IMPRESSION: Intermediate probability for pulmonary embolism. Electronically Signed   By: Davina Poke D.O.   On: 12/31/2020 13:30   US RENAL  Result Date: 12/20/2020 CLINICAL DATA:  Urinary retention. 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 EXAM: RENAL / URINARY TRACT ULTRASOUND COMPLETE COMPARISON:  CT 12/27/2020 FINDINGS: Right Kidney: Renal measurements: 14.4 x 9.2 x 7.9 cm = volume: 547 mL. Normal echogenicity. Mild right hydronephrosis, relatively similar to 12/13/2020 CT. Left Kidney: Renal measurements: 12.5 x 3.5 x 6.4 cm = volume: 231 mL. Echogenicity within normal limits. No mass or hydronephrosis visualized. Bladder: Echogenicity within the posterior bladder is likely due to the right ureteric stent. Other: Mild exam degradation secondary to patient's tenderness. IMPRESSION: 1. Mild right-sided hydronephrosis, relatively similar to 12/22/2020 CT. 2. No other explanation for urinary retention. Electronically Signed   By: Abigail Miyamoto M.D.   On: 12/20/2020 11:18   IR Fluoro Guide CV Line Left  Result Date: 12/17/2020 INDICATION: 60 year old male referred for hemodialysis catheter placement EXAM: TUNNELED CENTRAL VENOUS HEMODIALYSIS CATHETER  PLACEMENT WITH ULTRASOUND AND FLUOROSCOPIC GUIDANCE MEDICATIONS: 1 g vancomycin. The antibiotic was given in an appropriate time interval prior to skin puncture. ANESTHESIA/SEDATION: No moderate sedation. A total of Versed 0.5 mg and Fentanyl 0 mcg was administered intravenously. Moderate Sedation Time: 0 minutes. The patient's level of consciousness and vital signs were monitored continuously by radiology nursing throughout the procedure under my direct supervision. FLUOROSCOPY TIME:  Fluoroscopy Time: 0 minutes 54 seconds (3 mGy). COMPLICATIONS: None PROCEDURE: Informed written consent was obtained from the patient after a discussion of the risks, benefits, and alternatives to treatment. Questions regarding the procedure were encouraged and answered. The right neck and chest were prepped with chlorhexidine in a sterile fashion, and a sterile drape was applied covering the operative field. Maximum barrier sterile technique with sterile gowns and gloves were used for the procedure. A timeout was performed prior to the initiation of the procedure. Ultrasound survey was performed. Micropuncture kit was utilized to access the right internal jugular vein under direct, real-time ultrasound guidance after the overlying soft tissues were anesthetized with 1% lidocaine with epinephrine. Stab incision was made with 11 blade scalpel. Microwire was passed centrally. The microwire was then marked to measure appropriate internal catheter length. External tunneled length was estimated. A total tip to cuff length of 19 cm was selected. 035 guidewire was advanced to the level of the IVC. Skin and subcutaneous tissues of chest wall below the clavicle were generously infiltrated with 1% lidocaine for local anesthesia. A small stab incision was made with 11 blade scalpel. The selected hemodialysis catheter was tunneled in a retrograde fashion from the anterior chest wall to the venotomy incision. Serial dilation was performed and then  a peel-away sheath was placed. The catheter was then placed through the peel-away sheath with tips ultimately positioned within the superior aspect of the right atrium. Final catheter positioning was confirmed and documented with a spot radiographic image. The catheter aspirates and flushes normally. The catheter was flushed with appropriate volume heparin dwells. The catheter exit site was secured with a 0-Prolene retention suture. Gel-Foam slurry was infused into the soft tissue tract. The venotomy incision was closed Derma bond and sterile dressing. Dressings were applied at the chest wall. Patient tolerated the procedure well and remained hemodynamically stable throughout. No complications were encountered and no significant blood loss encountered. IMPRESSION: Status post right IJ tunneled hemodialysis catheter placement. Catheter ready for use. Signed, Dulcy Fanny. Dellia Nims, RPVI [facility] Radiology Electronically Signed   By: Corrie Mckusick D.O.   On: 12/17/2020 15:26   IR US Guide Vasc Access Left  Result Date: 12/17/2020 INDICATION: 60 year old male referred for hemodialysis catheter placement EXAM: TUNNELED CENTRAL VENOUS HEMODIALYSIS CATHETER PLACEMENT WITH ULTRASOUND AND FLUOROSCOPIC GUIDANCE MEDICATIONS: 1 g vancomycin. The antibiotic was given in an appropriate time interval prior to skin puncture. ANESTHESIA/SEDATION: No moderate sedation. A total of Versed 0.5 mg and Fentanyl 0 mcg was administered intravenously. Moderate Sedation Time: 0 minutes. The patient's level of consciousness and vital signs were monitored continuously by radiology nursing throughout the procedure under my direct supervision. FLUOROSCOPY TIME:  Fluoroscopy Time: 0 minutes 54 seconds (3 mGy). COMPLICATIONS: None PROCEDURE: Informed written consent was obtained from the patient after a discussion of the risks, benefits, and alternatives to treatment. Questions regarding the  procedure were encouraged and answered. The right neck and chest were prepped with chlorhexidine in a sterile fashion, and a sterile drape was applied covering the operative field. Maximum barrier  sterile technique with sterile gowns and gloves were used for the procedure. A timeout was performed prior to the initiation of the procedure. Ultrasound survey was performed. Micropuncture kit was utilized to access the right internal jugular vein under direct, real-time ultrasound guidance after the overlying soft tissues were anesthetized with 1% lidocaine with epinephrine. Stab incision was made with 11 blade scalpel. Microwire was passed centrally. The microwire was then marked to measure appropriate internal catheter length. External tunneled length was estimated. A total tip to cuff length of 19 cm was selected. 035 guidewire was advanced to the level of the IVC. Skin and subcutaneous tissues of chest wall below the clavicle were generously infiltrated with 1% lidocaine for local anesthesia. A small stab incision was made with 11 blade scalpel. The selected hemodialysis catheter was tunneled in a retrograde fashion from the anterior chest wall to the venotomy incision. Serial dilation was performed and then a peel-away sheath was placed. The catheter was then placed through the peel-away sheath with tips ultimately positioned within the superior aspect of the right atrium. Final catheter positioning was confirmed and documented with a spot radiographic image. The catheter aspirates and flushes normally. The catheter was flushed with appropriate volume heparin dwells. The catheter exit site was secured with a 0-Prolene retention suture. Gel-Foam slurry was infused into the soft tissue tract. The venotomy incision was closed Derma bond and sterile dressing. Dressings were applied at the chest wall. Patient tolerated the procedure well and remained hemodynamically stable throughout. No complications were encountered  and no significant blood loss encountered. IMPRESSION: Status post right IJ tunneled hemodialysis catheter placement. Catheter ready for use. Signed, Dulcy Fanny. Dellia Nims, RPVI [facility] Radiology Electronically Signed   By: Corrie Mckusick D.O.   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 ANESTHESIA/SEDATION: Fentanyl 100 mcg IV; Versed 4 mg IV Sedation time: 14 minutes; The patient was continuously monitored during the procedure by the interventional radiology nurse under my direct supervision. CONTRAST:  None. COMPLICATIONS: SIR Level A - No therapy, no consequence. Retroperitoneal hemorrhage at site of biopsy, controlled with Gel-Foam slurry administration along needle track. PROCEDURE: Informed consent was obtained from the patient following an explanation of the procedure, risks, benefits and alternatives. A time out was performed prior to the initiation of the procedure. The patient was positioned in a partial left lateral decubitus position on the CT table and a limited CT was performed for procedural planning demonstrating similar appearing multifocal right retroperitoneal lymphadenopathy. The procedure was planned. The operative site was prepped and draped in the usual sterile fashion. Appropriate trajectory was confirmed with a 22 gauge spinal needle after the adjacent tissues were anesthetized with 1% Lidocaine with epinephrine. Under intermittent CT guidance, a 17 gauge coaxial needle was advanced into the peripheral aspect of the mass. Appropriate positioning was confirmed and a total of 4 samples were obtained with an 18 gauge core needle biopsy device. A limited CT demonstrated focal hemorrhage about the biopsied lymph node.  Gel-Foam slurry was injected through the introducer needle along the needle track. The co-axial needle was removed and hemostasis was achieved with manual compression. Additional limited postprocedural CT was negative for expanding hemorrhage or additional complication. A dressing was placed. The patient tolerated the procedure well without immediate postprocedural complication. IMPRESSION: Technically successful CT guided  core needle biopsy of right retroperitoneal lymph node. Ruthann Cancer, MD [facility] Radiology Electronically Signed   By: Ruthann Cancer MD   On: 12/07/2020 08:53   DG CHEST PORT 1 VIEW  Result Date: 12/10/2020 CLINICAL DATA:  Hypoxia.  History of cardiac transplant EXAM: PORTABLE CHEST 1 VIEW COMPARISON:  12/28/2020 FINDINGS: Prior median sternotomy. Heart size within normal limits. Atherosclerotic calcification of the aortic knob. Subtle right lower lobe opacity, slightly improved from prior. Trace right pleural effusion. No pneumothorax. IMPRESSION: 1. Subtle right lower lobe opacity, slightly improved from prior. 2. Trace right pleural effusion. Electronically Signed   By: Davina Poke D.O.   On: 12/10/2020 10:33   DG C-Arm 1-60 Min-No Report  Result Date: 11/29/2020 Fluoroscopy was utilized by the requesting physician.  No radiographic interpretation.   ECHOCARDIOGRAM COMPLETE  Result Date: 11/30/2020    ECHOCARDIOGRAM REPORT   Patient Name:   LUSTER HECHLER Sessa Date of Exam: 11/30/2020 Medical Rec #:  [id number]         Height:       74.0 in Accession #:    [id number]        Weight:       221.0 lb Date of Birth:  10/05/1961        BSA:          2.268 m Patient Age:    29 years          BP:           103/59 mmHg Patient Gender: M                 HR:           94 bpm. Exam Location:  Inpatient Procedure: 2D Echo, Cardiac Doppler, Color Doppler and Intracardiac            Opacification Agent Indications:    Dyspnea R06.00   History:        Patient has prior history of Echocardiogram examinations, most                 recent 05/12/2017. Arrythmias:Atrial Fibrillation; Risk                 Factors:Non-Smoker. GERD. 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  Sonographer:    Vickie Epley RDCS Referring Phys: [id number] [facility]  Sonographer Comments: Suboptimal apical window and suboptimal subcostal window. Pt unable to turn on side due to herniated discs. IMPRESSIONS  1. Left ventricular ejection fraction, by estimation, is 60 to 65%. The left ventricle has normal function. The left ventricle has no regional wall motion abnormalities. Left ventricular diastolic parameters were normal.  2. Right ventricular systolic function is normal. The right ventricular size is normal. There is normal pulmonary artery systolic pressure. The estimated right ventricular systolic pressure is 93.2 mmHg.  3. The mitral valve is normal in structure. No evidence of mitral valve regurgitation. No evidence of mitral stenosis.  4. The aortic valve is normal in structure. Aortic valve regurgitation is not visualized. No aortic stenosis is present.  5. The inferior vena cava is normal in size with greater than 50% respiratory variability, suggesting right atrial pressure of 3 mmHg. FINDINGS  Left Ventricle: Left ventricular ejection fraction, by estimation, is 60 to 65%. The left ventricle has normal function. The left ventricle has no regional wall motion abnormalities. Definity contrast agent was given IV to delineate the left ventricular  endocardial borders. The left ventricular internal cavity size was  normal in size. There is no left ventricular hypertrophy. Left ventricular diastolic parameters were normal. Normal left ventricular filling pressure. Right Ventricle: The right ventricular size is normal. No increase in right ventricular wall thickness. Right ventricular systolic function is normal. There is normal pulmonary artery systolic pressure. The tricuspid  regurgitant velocity is 2.39 m/s, and  with an assumed right atrial pressure of 3 mmHg, the estimated right ventricular systolic pressure is 55.7 mmHg. Left Atrium: Left atrial size was normal in size. Right Atrium: Right atrial size was normal in size. Pericardium: There is no evidence of pericardial effusion. Mitral Valve: The mitral valve is normal in structure. No evidence of mitral valve regurgitation. No evidence of mitral valve stenosis. Tricuspid Valve: The tricuspid valve is normal in structure. Tricuspid valve regurgitation is mild . No evidence of tricuspid stenosis. Aortic Valve: The aortic valve is normal in structure. Aortic valve regurgitation is not visualized. No aortic stenosis is present. Pulmonic Valve: The pulmonic valve was normal in structure. Pulmonic valve regurgitation is not visualized. No evidence of pulmonic stenosis. Aorta: The aortic root is normal in size and structure. Venous: The inferior vena cava is normal in size with greater than 50% respiratory variability, suggesting right atrial pressure of 3 mmHg. IAS/Shunts: No atrial level shunt detected by color flow Doppler.  LEFT VENTRICLE PLAX 2D LVIDd:         4.40 cm     Diastology LVIDs:         3.20 cm     LV e' medial:    10.00 cm/s LV PW:         0.90 cm     LV E/e' medial:  7.5 LV IVS:        0.90 cm     LV e' lateral:   11.30 cm/s LVOT diam:     2.50 cm     LV E/e' lateral: 6.7 LV SV:         48 LV SV Index:   21 LVOT Area:     4.91 cm  LV Volumes (MOD) LV vol d, MOD A4C: 80.2 ml LV vol s, MOD A4C: 30.4 ml LV SV MOD A4C:     80.2 ml LEFT ATRIUM           Index LA diam:      2.70 cm 1.19 cm/m LA Vol (A4C): 16.3 ml 7.19 ml/m  AORTIC VALVE LVOT Vmax:   50.20 cm/s LVOT Vmean:  36.700 cm/s LVOT VTI:    0.099 m  AORTA Ao Root diam: 3.40 cm MITRAL VALVE               TRICUSPID VALVE MV Area (PHT): 5.38 cm    TR Peak grad:   22.8 mmHg MV Decel Time: 141 msec    TR Vmax:        239.00 cm/s MV E velocity: 75.40 cm/s MV A velocity:  36.00 cm/s  SHUNTS MV E/A ratio:  2.09        Systemic VTI:  0.10 m                            Systemic Diam: 2.50 cm Dani Gobble Croitoru MD Electronically signed by Sanda Klein MD Signature Date/Time: 11/30/2020/12:30:40 PM    Final    VAS [place] LOWER EXTREMITY VENOUS (DVT)  Result Date: 12/10/2020  Lower Venous DVT Study Indications: Pain.  Risk Factors: Immobility Cancer Terminal Cancer Surgery Multiple surgeries  in past 6 mths. Comparison Study: Previous 12/02/20 Negative Performing Technologist: Vonzell Schlatter RVT  Examination Guidelines: A complete evaluation includes B-mode imaging, spectral Doppler, color Doppler, and power Doppler as needed of all accessible portions of each vessel. Bilateral testing is considered an integral part of a complete examination. Limited examinations for reoccurring indications may be performed as noted. The reflux portion of the exam is performed with the patient in reverse Trendelenburg.  +---------+---------------+---------+-----------+----------+--------------+ RIGHT    CompressibilityPhasicitySpontaneityPropertiesThrombus Aging +---------+---------------+---------+-----------+----------+--------------+ CFV      Full           Yes      Yes                                 +---------+---------------+---------+-----------+----------+--------------+ SFJ      Full                                                        +---------+---------------+---------+-----------+----------+--------------+ FV Prox  Full                                                        +---------+---------------+---------+-----------+----------+--------------+ FV Mid   Full                                                        +---------+---------------+---------+-----------+----------+--------------+ FV DistalFull                                                        +---------+---------------+---------+-----------+----------+--------------+ PFV      Full                                                         +---------+---------------+---------+-----------+----------+--------------+ POP      Full           Yes      Yes                                 +---------+---------------+---------+-----------+----------+--------------+ PTV      None                                                        +---------+---------------+---------+-----------+----------+--------------+ PERO     None                                                        +---------+---------------+---------+-----------+----------+--------------+   +---------+---------------+---------+-----------+----------+--------------+  LEFT     CompressibilityPhasicitySpontaneityPropertiesThrombus Aging +---------+---------------+---------+-----------+----------+--------------+ CFV      Full           Yes      Yes                                 +---------+---------------+---------+-----------+----------+--------------+ SFJ      Full                                                        +---------+---------------+---------+-----------+----------+--------------+ FV Prox  Full                                                        +---------+---------------+---------+-----------+----------+--------------+ FV Mid   Full                                                        +---------+---------------+---------+-----------+----------+--------------+ FV DistalFull                                                        +---------+---------------+---------+-----------+----------+--------------+ PFV      Full                                                        +---------+---------------+---------+-----------+----------+--------------+ POP      Full           Yes      Yes                                 +---------+---------------+---------+-----------+----------+--------------+ PTV      Full                                                         +---------+---------------+---------+-----------+----------+--------------+ PERO     Full                                                        +---------+---------------+---------+-----------+----------+--------------+     Summary: RIGHT: - Findings consistent with acute deep vein thrombosis involving one right posterior tibial vein, and both right peroneal veins. - No cystic structure found in the popliteal fossa.  LEFT: - There is no evidence of deep vein thrombosis in the  lower extremity.  - No cystic structure found in the popliteal fossa.  *See table(s) above for measurements and observations. Electronically signed by Jamelle Haring on 12/10/2020 at 7:16:00 PM.    Final    VAS [place] LOWER EXTREMITY VENOUS (DVT)  Result Date: 12/03/2020  Lower Venous DVT Study Indications: Pain.  Comparison Study: Previous 12/2019 Performing Technologist: Vonzell Schlatter RVT  Examination Guidelines: A complete evaluation includes B-mode imaging, spectral Doppler, color Doppler, and power Doppler as needed of all accessible portions of each vessel. Bilateral testing is considered an integral part of a complete examination. Limited examinations for reoccurring indications may be performed as noted. The reflux portion of the exam is performed with the patient in reverse Trendelenburg.  +---------+---------------+---------+-----------+----------+--------------+ RIGHT    CompressibilityPhasicitySpontaneityPropertiesThrombus Aging +---------+---------------+---------+-----------+----------+--------------+ CFV      Full           Yes      Yes                                 +---------+---------------+---------+-----------+----------+--------------+ SFJ      Full                                                        +---------+---------------+---------+-----------+----------+--------------+ FV Prox  Full                                                         +---------+---------------+---------+-----------+----------+--------------+ FV Mid   Full                                                        +---------+---------------+---------+-----------+----------+--------------+ FV DistalFull                                                        +---------+---------------+---------+-----------+----------+--------------+ PFV      Full                                                        +---------+---------------+---------+-----------+----------+--------------+ POP      Full           Yes      Yes                                 +---------+---------------+---------+-----------+----------+--------------+ PTV      Full                                                        +---------+---------------+---------+-----------+----------+--------------+  PERO     Full                                                        +---------+---------------+---------+-----------+----------+--------------+   +---------+---------------+---------+-----------+----------+--------------+ LEFT     CompressibilityPhasicitySpontaneityPropertiesThrombus Aging +---------+---------------+---------+-----------+----------+--------------+ CFV      Full           Yes      Yes                                 +---------+---------------+---------+-----------+----------+--------------+ SFJ      Full                                                        +---------+---------------+---------+-----------+----------+--------------+ FV Prox  Full                                                        +---------+---------------+---------+-----------+----------+--------------+ FV Mid   Full                                                        +---------+---------------+---------+-----------+----------+--------------+ FV DistalFull                                                         +---------+---------------+---------+-----------+----------+--------------+ PFV      Full                                                        +---------+---------------+---------+-----------+----------+--------------+ POP      Full           Yes      Yes                                 +---------+---------------+---------+-----------+----------+--------------+ PTV      Full                                                        +---------+---------------+---------+-----------+----------+--------------+ PERO     Full                                                        +---------+---------------+---------+-----------+----------+--------------+  Summary: RIGHT: - There is no evidence of deep vein thrombosis in the lower extremity.  - No cystic structure found in the popliteal fossa.  LEFT: - There is no evidence of deep vein thrombosis in the lower extremity.  - No cystic structure found in the popliteal fossa.  *See table(s) above for measurements and observations. Electronically signed by Ruta Hinds MD on 12/03/2020 at 12:15:41 PM.    Final    ECHOCARDIOGRAM LIMITED  Result Date: 12/10/2020    ECHOCARDIOGRAM LIMITED REPORT   Patient Name:   REYNOLD MANTELL Maradiaga Date of Exam: 12/10/2020 Medical Rec #:  [id number]         Height:       74.0 in Accession #:    [id number]        Weight:       230.4 lb Date of Birth:  [birth year]        BSA:          2.308 m Patient Age:    77 years          BP:           105/72 mmHg Patient Gender: M                 HR:           99 bpm. Exam Location:  Inpatient Procedure: Limited Echo, Cardiac Doppler and Color Doppler Indications:    Acute DVT  History:        Patient has prior history of Echocardiogram examinations, most                 recent 11/30/2020. Arrythmias:Atrial Fibrillation. CKD. GERD.                 S/P heart transplant 01/22/16.  Sonographer:    Clayton Lefort RDCS (AE) Referring Phys: [id number] [facility]  Sonographer Comments:  Technically difficult study due to poor echo windows, suboptimal apical window and suboptimal subcostal window. Unable to roll patient due to herniated discs. IMPRESSIONS  1. S/P Heart Transplant. Left ventricular ejection fraction, by estimation, is 60 to 65%. The left ventricle has normal function. Left ventricular endocardial border not optimally defined to evaluate regional wall motion. There is moderate concentric left ventricular hypertrophy. Left ventricular diastolic function could not be evaluated.  2. Right ventricular systolic function is moderately reduced. The right ventricular size is mildly enlarged. Tricuspid regurgitation signal is inadequate for assessing PA pressure.  3. A small pericardial effusion is present. The pericardial effusion is surrounding the apex.  4. Tricuspid valve regurgitation is mild to moderate. FINDINGS  Left Ventricle: S/P Heart Transplant. Left ventricular ejection fraction, by estimation, is 60 to 65%. The left ventricle has normal function. Left ventricular endocardial border not optimally defined to evaluate regional wall motion. There is moderate concentric left ventricular hypertrophy. Left ventricular diastolic function could not be evaluated. Right Ventricle: The right ventricular size is mildly enlarged. Right ventricular systolic function is moderately reduced. Tricuspid regurgitation signal is inadequate for assessing PA pressure. Pericardium: A small pericardial effusion is present. The pericardial effusion is surrounding the apex. Tricuspid Valve: Tricuspid valve regurgitation is mild to moderate. Venous: The inferior vena cava was not well visualized. 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  Molecular studies pending. -12/18/2020 CT chest/abdomen/pelvis without contrast showed "1. Retroperitoneal adenopathy and perinephric nodularity with dilation of the RIGHT renal pelvis and collecting systems, filled with what is suspected to represent neoplasm. 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 Differential considerations given heart transplantation and presumed immunosuppression would include posttransplant lymphoproliferative disorder/lymphoma. 2. Dense material in collecting systems may represent a mixture of tumor and or blood and there is profound dilation of collecting systems of the RIGHT kidney. This is unchanged compared to the recent comparison study. 3. Signs of hepatic involvement with new lesions in the RIGHT hepatic lobe. 4. Ascites with small amount of ascites with very subtle infiltration of fat in the omentum not mentioned above, for instance on image 50 of series 2 raising the question of peritoneal involvement as well. This area is in the range of 2-3 mm. 5. Small RIGHT pleural effusion and basilar airspace disease. 6. Postoperative changes about the chest likely related to prior heart transplantation with surgical clips in the RIGHT axilla and thickening and calcification along the anterior pectoralis musculature not changed since 2018. 7. Calcification in the LEFT atrium also about the aortic root and pulmonary artery and to a lesser degree in the RIGHT atrium likely reflects changes of vascular anastomotic sites in the setting of heart transplantation.  8. 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 9. Post splenectomy. 10. Aortic atherosclerosis. 11. Small nodule in the LEFT upper lobe 6 mm airways are patent."  2) Back pain secondary to #1  3) Hyponatremia  4) Leukocytosis and Thrombocytosis  5) CKD  6) High-grade T1 bladder cancer  7) History of Hodgkin's lymphoma  8) status post heart transplant  PLAN: -The patient was started on Padcev day 1 of cycle #1 on 12/14/2020.  We will plan to proceed with day 8 cycle #1 of Padcev on 12/21/2020.  Padcev dose will be increased to 1.25 mg/kg for day 8 of cycle #1.  Okay to proceed with chemotherapy despite elevated creatinine. -We will follow-up on molecular studies and PD-L1 testing. -Request for second opinion at [facility] has been sent.  Awaiting appointment to be arranged. -He is aware that this is an incurable condition. -Continue hemodialysis per nephrology. -Recommend ongoing care by the palliative care team to assist with goals of care discussion and pain management. -Continue IV heparin for lower extremity DVT which was likely triggered by malignancy and immobility.  If renal function stabilizes, can transition to Lovenox 1 mg/kg once daily and check Xa levels to adjust dose.   LOS: 13 days   Mikey Bussing, DNP, AGPCNP-BC, AOCNP 12/20/20

## 2020-12-20 NOTE — Progress Notes (Signed)
[facility]                                                                              Patient Demographics  Kolbi Altadonna, is a 60 y.o. male, DOB - [birth year], ZOX:[id number]  Admit date - 12/31/2020   Admitting Physician Desiree Hane, MD  Outpatient Primary MD for the patient is Lajean Manes, MD  Outpatient specialists:   LOS - 13  days   Medical records reviewed and are as summarized below:    No chief complaint on file.      Brief summary  Ronne Stefanski Duehringis a 60 y.o.year old malewith medical history significant for Hodgkin lymphoma, cardiac transplant on tacrolimus and prednisone, chronic combined systolic/diastolic CHF, GERD, high-grade T1 bladder cancer with recent hospitalization from 81/19-1/4 for AKI complicated by hydronephrosis in the setting of new retroperitoneal lymphadenopathy. Patient underwent stent placement by urology and was treated for postoperative multifocal pneumonia.  He presented from urology office with reports of increased back pain not well controlled on oral pain medication as well as generalized fatigue, poor appetite. He was brought in under observation due to concern for back pain in the setting of known retroperitoneal lymphadenopathy.  Hospital course complicated by acute hyponatremia in the setting of poor appetite requiring IV fluids and persistent leukocytosis. CT abdomen imaging consistent with retroperitoneal adenopathy and dilation of right renal pelvis and collecting systems concerning for urothelial neoplasm with extensive metastatic process. Found to have acute right lower extremity DVT on venous duplex started on heparin on 1/10. Has opted for dialysis while awaiting [facility] response.  1/17: Tunneled dialysis cath placed by IR, initiated hemodialysis, first 1/17, tolerated 1/18: second hemodialysis planned  Assessment & Plan    Principal Problem: Retroperitoneal adenopathy and perinephric nodularity,  high-grade bladder CA, recently diagnosed -Status post IR guided retroperitoneal biopsy, pathology positive for poorly differentiated carcinoma.  Recent hospitalization 78/29-5/6 for AKI complicated by hydronephrosis in the setting of new retroperitoneal LAD, underwent stent placement. - Urology and oncology following. - Oncology consulted, sent for molecular testing and hoping to discuss with [facility] oncology regarding patient's options --Startedon palliative chemo 1/14, palliative medicine following - Per oncology, patient will be due for C1D8 Padcev chemo on 1/21  AKI on CKD stage 3a, now ? dialysis dependent, new -Baseline creatinine 1.2-1.4 from 08/2020, 1.8 from last hospitalization -Suspect lymphatic obstruction due to malignancy, low albumin, renal function worsening over the last few weeks.  Nephrology was consulted. - Lasix was added for volume overload though not effective.  2D echo showed normal EF - 1/17: IR consulted and tunneled HD cath placed, initiated on HD, tolerated -1/18: Tolerated hemodialysis, 1/19 undergoing hemodialysis without any issues -1/20, patient reported urinary retention overnight, renal ultrasound obtained which showed similar right-sided hydronephrosis, no acute worsening.  Patient continues to remain apprehensive because of urinary retention and peripheral edema.  I contacted patient's urologist, Dr. Milford Cage who reviewed the renal ultrasound and will follow-up with patient today.  Recommended Foley cath if patient starts having retention again and leave urine for 1 to 2 weeks for bladder decompression. -I was also contacted by patient's oncologist, Dr Sonny Dandy, who  requested abdominal x-ray to further evaluate the patient's stent  Hyperkalemia -Was placed on Lokelma twice daily, now on HD, nephrology following -Potassium currently improved 4.8  Acute right lower extremity DVT -Continue IV heparin, H&H stable  Small retroperitoneal hemorrhage status post  biopsy -H&H stable  Acute on chronic hyponatremia -Initially improved with IV fluid and thought to be due to diminished p.o. intake but then patient was volume overloaded -Started on hemodialysis on 1/17, underwent back-to-back HD on 1/18 and 1/19. -Sodium improving 128  Small pericardial effusion -Incidentally noted on echocardiogram, without tamponade physiology  Chronic combined systolic and diastolic heart failure, history of cardiac transplant -Patient is status post heart transplant, prior EF 10% - EF has now corrected to 60 to 65% -Volume control with HD, initiated on 1/17  History of cardiac transplant -Continue tacrolimus, to keep goal levels between 5-8 -Continue dexamethasone, reduced dose to 4 mg daily by Dr. Irene Limbo on 1/18 and okay to wean down to his baseline prednisone 5 mg daily -Cardiology was consulted, per Dr Haroldine Laws, EF is normal, recommended to continue the immunosuppressive regimen and follow the tacrolimus levels. Dr. Haroldine Laws has notified/updated the transplant team at [facility].   Hypothyroidism -Continue Synthroid  Code Status: DNR DVT Prophylaxis: IV heparin Family Communication: Discussed all imaging results, lab results, explained to the patient.  Called patient's wife, Ms. Ephraim Reichel on phone [phone] and updated on 1/19  Disposition Plan:     Status is: Inpatient  Remains inpatient appropriate because:Inpatient level of care appropriate due to severity of illness   Dispo: The patient is from: Home              Anticipated d/c is to: Home              Anticipated d/c date is: > 3 days              Patient currently is not medically stable to d/c. Initiated on HD, has fluid overload, multiple electrolyte abnormalities, not stable for discharge.      Time Spent in minutes   35 minutes  Procedures:  HD cath 1.17 IR guided biopsy 1/06  Consultants:   Nephrology Oncology Palliative IR  Antimicrobials:   Anti-infectives (From  admission, onward)   Start     Dose/Rate Route Frequency Ordered Stop   12/17/20 0830  vancomycin (VANCOCIN) IVPB 1000 mg/200 mL premix        1,000 mg 200 mL/hr over 60 Minutes Intravenous To Radiology 12/17/20 0744 12/17/20 0932   12/15/2020 2200  cefdinir (OMNICEF) capsule 300 mg        300 mg Oral 2 times daily 12/31/2020 1716 12/10/20 2159         Medications  Scheduled Meds: . allopurinol  200 mg Oral Daily  . artificial tears  1 application Both Eyes QHS  . azelastine  1 spray Each Nare Daily   And  . fluticasone  1 spray Each Nare Daily  . cetirizine  10 mg Oral QHS  . Chlorhexidine Gluconate Cloth  6 each Topical Daily  . Chlorhexidine Gluconate Cloth  6 each Topical Q0600  . dexamethasone  4 mg Oral Q breakfast  . fentaNYL  1 patch Transdermal Q72H  . lactulose  10 g Oral BID  . levothyroxine  125 mcg Oral Daily  . metoCLOPramide  5 mg Oral TID AC & HS  . polyvinyl alcohol  1 drop Both Eyes TID  . senna-docusate  3 tablet Oral BID  . Tacrolimus  ER  1.5 mg Oral Daily  . traZODone  100 mg Oral QHS   Continuous Infusions: . heparin 1,550 Units/hr (12/20/20 3810)   PRN Meds:.acetaminophen **OR** acetaminophen, bisacodyl, HYDROmorphone (DILAUDID) injection, magnesium hydroxide, ondansetron **OR** ondansetron (ZOFRAN) IV, pantoprazole, prochlorperazine      Subjective:   Eleazar Kimmey was seen and examined today.  Had issues with urinary retention in the evening yesterday.  No acute complaints this morning, alert and oriented.  Tolerated hemodialysis.  No fevers, chest pain, shortness of breath.   Objective:   Vitals:   12/19/20 1302 12/19/20 1900 12/20/20 0500 12/20/20 0859  BP: 91/64 105/68 97/60 (!) 88/61  Pulse: 94 89 80 81  Resp: $Remo'17 17 16 17  'HPMmc$ Temp: (!) 97.5 F (36.4 C) 98.2 F (36.8 C) 98.1 F (36.7 C) 98.3 F (36.8 C)  TempSrc: Oral Oral Oral   SpO2: 91% 95%  94%  Weight:      Height:        Intake/Output Summary (Last 24 hours) at 12/20/2020  1524 Last data filed at 12/20/2020 1300 Gross per 24 hour  Intake 1020 ml  Output 500 ml  Net 520 ml     Wt Readings from Last 3 Encounters:  12/19/20 103.2 kg  11/29/20 100.2 kg  08/31/20 101.2 kg   Physical Exam  General: Alert and oriented x 3, NAD  Cardiovascular: S1 S2 clear, RRR.  2+ pedal edema b/l  Respiratory: CTAB, no wheezing, rales or rhonchi.  TDC in upper right chest wall  Gastrointestinal: Soft, nontender, nondistended, NBS  Ext: 2+ pedal edema bilaterally  Neuro: no new deficits  Musculoskeletal: No cyanosis, clubbing  Skin: No rashes  Psych: Normal affect and demeanor, alert and oriented x3      Data Reviewed:  I have personally reviewed following labs and imaging studies  Micro Results No results found for this or any previous visit (from the past 240 hour(s)).  Radiology Reports CT ABDOMEN PELVIS WO CONTRAST  Result Date: 12/07/2020 CLINICAL DATA:  Cancer of unknown primary in this 60 year old male. EXAM: CT CHEST, ABDOMEN AND PELVIS WITHOUT CONTRAST TECHNIQUE: Multidetector CT imaging of the chest, abdomen and pelvis was performed following the standard protocol without IV contrast. COMPARISON:  CT abdomen and pelvis of the same date. Also with CT of December 30th of 2021 FINDINGS: CT CHEST FINDINGS Cardiovascular: Calcified atheromatous plaque in the thoracic aorta. Heart size is normal following sternotomy for heart transplant by report a [facility] center. Three-vessel coronary artery calcification. No pericardial effusion. Calcifications in the LEFT atrium partially seen on previous imaging are of uncertain significance central pulmonary vasculature also with some signs of calcification. Mediastinum/Nodes: Ovoid structure associated with LEFT thyroid 11 mm similar to previous imaging, cervical spine from 2017. Thyroid as described above. Calcifications in the LEFT supraclavicular region may be within venous structures on the LEFT. No  axillary lymphadenopathy. Surgical clips in the RIGHT axilla and soft tissue thickening over the LEFT pectoralis musculature similar grossly compared to remote MRI evaluation 2018 and surgical clips seen on numerous priors in the RIGHT axilla. No mediastinal lymphadenopathy. No gross hilar lymphadenopathy. Lungs/Pleura: Small RIGHT of pleural effusion and basilar airspace disease. Small nodule in the superior aspect of the RIGHT middle lobe approximately 3 mm on image 57 of series 5. Biapical scarring. Small nodule in the LEFT upper lobe 6 mm (image 51, series 5) airways are patent. Musculoskeletal: Evidence ir sternotomy without significant bony bridging but with sclerotic margins along the sternotomy line  similar to studies dating back to August of 2021 with respect to visualized portions on prior abdomen studies no destructive bone finding or acute bone process. CT ABDOMEN PELVIS FINDINGS Hepatobiliary: 1.9 cm lesion in the RIGHT hepatic lobe near the dome of the RIGHT hemi liver does not display attenuation values of simple cyst is unchanged from the study acquired on the same date at another facility. Additionally a 1.4 cm lesion in the RIGHT hepatic lobe on image 23 of series 2 is unchanged. Sludge in the gallbladder. Tiny low-density foci in the LEFT hepatic lobe without change 1 on image 19 of series 2 in the lateral segment measuring 5 mm and another in the medial segment of the LEFT hepatic lobe measuring 12 mm on image 21 of series 2 these areas appears similar dating back to August of 2020, lesions in the RIGHT hepatic lobe are new. Pancreas: Normal pancreatic contour without signs of inflammation. Spleen: Post splenectomy. Adrenals/Urinary Tract: Adrenal glands, normal on the LEFT and obscured on the RIGHT by perinephric and Peri adrenal nodularity. Fullness of the RIGHT renal pelvis with nephroureteral stent in place showing soft tissue density with distension of the renal pelvis and calices similar to  the study acquired on the same date. Nephrolithiasis also unchanged. Perinephric nodularity along the medial upper pole the LEFT kidney measures 6.4 x 2.7 cm which is within 1-2 mm of its previous measurement when measured by this observer on the prior study. Increased in size when compared to the study of November 30, 2020. Renal cortical scarring on the LEFT with nephrolithiasis in the lower pole unchanged from very recent comparison. Urinary bladder with small amount of gas in the urinary bladder with distal aspect of the stent in place, loop coiled in the urinary bladder. Stomach/Bowel: Scattered colonic diverticulosis without acute gastrointestinal process. Normal appendix. Stool and contrast throughout the colon. Vascular/Lymphatic: Calcified atheromatous plaque in the abdominal aorta without aneurysmal dilation. Retroperitoneal adenopathy unchanged from scan earlier the same day, largest lymph nodes behind the inferior vena cava and in the intra-aortocaval groove and in the upper abdomen, largest on image 41 of series 2 measuring 1.9 cm. Smaller lymph nodes along the LEFT periaortic chain. Postoperative changes in the retroperitoneum associated with previous lymphadenectomy No pelvic lymphadenopathy. Reproductive: Prostate with uro lift device deployment bilaterally similar to recent priors. Other: Extensive retroperitoneal fascial thickening/stranding extending into the root of the small bowel mesentery, nodular changes associated with this thickening on the RIGHT outlining the renal fascia this crosses the midline where it is without significant nodularity but with fascial thickening and stranding, also tracking into the pelvis. Small amount of fluid in the pelvis. Musculoskeletal: Spinal degenerative changes. No destructive bone process or acute bone finding. IMPRESSION: 1. Retroperitoneal adenopathy and perinephric nodularity with dilation of the RIGHT renal pelvis and collecting systems, filled with what  is suspected to represent neoplasm. 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 Differential considerations given heart transplantation and presumed immunosuppression would include posttransplant lymphoproliferative disorder/lymphoma. 2. Dense material in collecting systems may represent a mixture of tumor and or blood and there is profound dilation of collecting systems of the RIGHT kidney. This is unchanged compared to the recent comparison study. 3. Signs of hepatic involvement with new lesions in the RIGHT hepatic lobe. 4. Ascites with small amount of ascites with very subtle infiltration of fat in the omentum not mentioned above, for instance on image 50 of series  2 raising the question of peritoneal involvement as well. This area is in the range of 2-3 mm. 5. Small RIGHT pleural effusion and basilar airspace disease. 6. Postoperative changes about the chest likely related to prior heart transplantation with surgical clips in the RIGHT axilla and thickening and calcification along the anterior pectoralis musculature not changed since 2018. 7. Calcification in the LEFT atrium also about the aortic root and pulmonary artery and to a lesser degree in the RIGHT atrium likely reflects changes of vascular anastomotic sites in the setting of heart transplantation. 8. 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 9. Post splenectomy. 10. Aortic atherosclerosis. 11. Small nodule in the LEFT upper lobe 6 mm airways are patent. These results will be called to the ordering clinician or representative by the Radiologist Assistant, and communication documented in the PACS or [employer]. Aortic Atherosclerosis (ICD10-I70.0). Electronically Signed   By: Zetta Bills M.D.   On: 12/11/2020 17:38   DG Chest 1 View  Result Date: 11/29/2020 CLINICAL  DATA:  Hypoxia EXAM: CHEST  1 VIEW COMPARISON:  08/03/2020 FINDINGS: Since the prior examination, there has developed right basilar consolidation, possibly infectious in the appropriate clinical setting. Mild focal infiltrate is also noted within the right apex. Left lung is clear. No pneumothorax or pleural effusion. Median sternotomy has been performed. Cardiac size within normal limits. Multiple healed right rib fractures are noted. Multiple surgical clips are seen within the right axilla. IMPRESSION: Interval development of multifocal pulmonary infiltrate within the right lung, more focal within the right lung base, suspicious for atypical infection in the acute setting. Electronically Signed   By: Fidela Salisbury MD   On: 11/29/2020 22:27   DG Chest 2 View  Result Date: 12/21/2020 CLINICAL DATA:  Bladder cancer.  Follow-up multifocal pneumonia. EXAM: CHEST - 2 VIEW COMPARISON:  11/29/2020 FINDINGS: Prior median sternotomy. Heart is normal size. Patchy right lung airspace disease in the right apex and right lower lung. Minimal left base linear atelectasis or scarring. Overall aeration in the right lung slightly improved. No effusions. IMPRESSION: Patchy right lung airspace disease with slight improvement since prior study. Left base atelectasis or scarring. Electronically Signed   By: Rolm Baptise M.D.   On: 12/21/2020 13:25   CT CHEST WO CONTRAST  Result Date: 12/20/2020 CLINICAL DATA:  Cancer of unknown primary in this 60 year old male. EXAM: CT CHEST, ABDOMEN AND PELVIS WITHOUT CONTRAST TECHNIQUE: Multidetector CT imaging of the chest, abdomen and pelvis was performed following the standard protocol without IV contrast. COMPARISON:  CT abdomen and pelvis of the same date. Also with CT of December 30th of 2021 FINDINGS: CT CHEST FINDINGS Cardiovascular: Calcified atheromatous plaque in the thoracic aorta. Heart size is normal following sternotomy for heart transplant by report a [facility] [facility] medical  center. Three-vessel coronary artery calcification. No pericardial effusion. Calcifications in the LEFT atrium partially seen on previous imaging are of uncertain significance central pulmonary vasculature also with some signs of calcification. Mediastinum/Nodes: Ovoid structure associated with LEFT thyroid 11 mm similar to previous imaging, cervical spine from 2017. Thyroid as described above. Calcifications in the LEFT supraclavicular region may be within venous structures on the LEFT. No axillary lymphadenopathy. Surgical clips in the RIGHT axilla and soft tissue thickening over the LEFT pectoralis musculature similar grossly compared to remote MRI evaluation 2018 and surgical clips seen on numerous priors in the RIGHT axilla. No mediastinal lymphadenopathy. No gross hilar lymphadenopathy.  Lungs/Pleura: Small RIGHT of pleural effusion and basilar airspace disease. Small nodule in the superior aspect of the RIGHT middle lobe approximately 3 mm on image 57 of series 5. Biapical scarring. Small nodule in the LEFT upper lobe 6 mm (image 51, series 5) airways are patent. Musculoskeletal: Evidence ir sternotomy without significant bony bridging but with sclerotic margins along the sternotomy line similar to studies dating back to August of 2021 with respect to visualized portions on prior abdomen studies no destructive bone finding or acute bone process. CT ABDOMEN PELVIS FINDINGS Hepatobiliary: 1.9 cm lesion in the RIGHT hepatic lobe near the dome of the RIGHT hemi liver does not display attenuation values of simple cyst is unchanged from the study acquired on the same date at another facility. Additionally a 1.4 cm lesion in the RIGHT hepatic lobe on image 23 of series 2 is unchanged. Sludge in the gallbladder. Tiny low-density foci in the LEFT hepatic lobe without change 1 on image 19 of series 2 in the lateral segment measuring 5 mm and another in the medial segment of the LEFT hepatic lobe measuring 12 mm on image  21 of series 2 these areas appears similar dating back to August of 2020, lesions in the RIGHT hepatic lobe are new. Pancreas: Normal pancreatic contour without signs of inflammation. Spleen: Post splenectomy. Adrenals/Urinary Tract: Adrenal glands, normal on the LEFT and obscured on the RIGHT by perinephric and Peri adrenal nodularity. Fullness of the RIGHT renal pelvis with nephroureteral stent in place showing soft tissue density with distension of the renal pelvis and calices similar to the study acquired on the same date. Nephrolithiasis also unchanged. Perinephric nodularity along the medial upper pole the LEFT kidney measures 6.4 x 2.7 cm which is within 1-2 mm of its previous measurement when measured by this observer on the prior study. Increased in size when compared to the study of November 30, 2020. Renal cortical scarring on the LEFT with nephrolithiasis in the lower pole unchanged from very recent comparison. Urinary bladder with small amount of gas in the urinary bladder with distal aspect of the stent in place, loop coiled in the urinary bladder. Stomach/Bowel: Scattered colonic diverticulosis without acute gastrointestinal process. Normal appendix. Stool and contrast throughout the colon. Vascular/Lymphatic: Calcified atheromatous plaque in the abdominal aorta without aneurysmal dilation. Retroperitoneal adenopathy unchanged from scan earlier the same day, largest lymph nodes behind the inferior vena cava and in the intra-aortocaval groove and in the upper abdomen, largest on image 41 of series 2 measuring 1.9 cm. Smaller lymph nodes along the LEFT periaortic chain. Postoperative changes in the retroperitoneum associated with previous lymphadenectomy No pelvic lymphadenopathy. Reproductive: Prostate with uro lift device deployment bilaterally similar to recent priors. Other: Extensive retroperitoneal fascial thickening/stranding extending into the root of the small bowel mesentery, nodular changes  associated with this thickening on the RIGHT outlining the renal fascia this crosses the midline where it is without significant nodularity but with fascial thickening and stranding, also tracking into the pelvis. Small amount of fluid in the pelvis. Musculoskeletal: Spinal degenerative changes. No destructive bone process or acute bone finding. IMPRESSION: 1. Retroperitoneal adenopathy and perinephric nodularity with dilation of the RIGHT renal pelvis and collecting systems, filled with what is suspected to represent neoplasm. 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 Differential considerations given heart transplantation and presumed immunosuppression would include posttransplant lymphoproliferative disorder/lymphoma. 2. Dense material in collecting systems may represent a mixture of  tumor and or blood and there is profound dilation of collecting systems of the RIGHT kidney. This is unchanged compared to the recent comparison study. 3. Signs of hepatic involvement with new lesions in the RIGHT hepatic lobe. 4. Ascites with small amount of ascites with very subtle infiltration of fat in the omentum not mentioned above, for instance on image 50 of series 2 raising the question of peritoneal involvement as well. This area is in the range of 2-3 mm. 5. Small RIGHT pleural effusion and basilar airspace disease. 6. Postoperative changes about the chest likely related to prior heart transplantation with surgical clips in the RIGHT axilla and thickening and calcification along the anterior pectoralis musculature not changed since 2018. 7. Calcification in the LEFT atrium also about the aortic root and pulmonary artery and to a lesser degree in the RIGHT atrium likely reflects changes of vascular anastomotic sites in the setting of heart transplantation. 8. 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 9. Post splenectomy. 10. Aortic atherosclerosis. 11. Small nodule in the LEFT upper lobe 6 mm airways are patent. These results will be called to the ordering clinician or representative by the Radiologist Assistant, and communication documented in the PACS or [employer]. Aortic Atherosclerosis (ICD10-I70.0). Electronically Signed   By: Zetta Bills M.D.   On: 12/27/2020 17:38   NM Pulmonary Perfusion  Result Date: 12/18/2020 CLINICAL DATA:  Respiratory failure EXAM: NUCLEAR MEDICINE PERFUSION LUNG SCAN TECHNIQUE: Perfusion images were obtained in multiple projections after intravenous injection of radiopharmaceutical. Ventilation scans intentionally deferred if perfusion scan and chest x-ray adequate for interpretation during COVID 19 epidemic. RADIOPHARMACEUTICALS:  4.1 mCi Tc-44m MAA IV COMPARISON:  Chest x-ray 12/06/2020 FINDINGS: Slightly heterogeneous distribution of radiotracer within the lung fields. Multiple small bilateral wedge-shaped perfusion defects. No corresponding radiographic abnormality. No large mismatched segmental perfusion defect. IMPRESSION: Intermediate probability for pulmonary embolism. Electronically Signed   By: Davina Poke D.O.   On: 12/29/2020 13:30   US RENAL  Result Date: 12/20/2020 CLINICAL DATA:  Urinary retention. 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 EXAM: RENAL / URINARY TRACT ULTRASOUND COMPLETE COMPARISON:  CT 12/20/2020 FINDINGS: Right Kidney: Renal measurements: 14.4 x 9.2 x 7.9 cm = volume: 547 mL. Normal echogenicity. Mild right hydronephrosis, relatively similar to 12/07/2020 CT. Left Kidney: Renal measurements: 12.5 x 3.5 x 6.4 cm = volume: 231 mL. Echogenicity within normal limits. No mass or hydronephrosis visualized. Bladder: Echogenicity within the posterior bladder is likely due to the right ureteric stent. Other: Mild exam degradation secondary to patient's tenderness. IMPRESSION: 1.  Mild right-sided hydronephrosis, relatively similar to 12/15/2020 CT. 2. No other explanation for urinary retention. Electronically Signed   By: Abigail Miyamoto M.D.   On: 12/20/2020 11:18   IR Fluoro Guide CV Line Left  Result Date: 12/17/2020 INDICATION: 60 year old male referred for hemodialysis catheter placement EXAM: TUNNELED CENTRAL VENOUS HEMODIALYSIS CATHETER PLACEMENT WITH ULTRASOUND AND FLUOROSCOPIC GUIDANCE MEDICATIONS: 1 g vancomycin. The antibiotic was given in an appropriate time interval prior to skin puncture. ANESTHESIA/SEDATION: No moderate sedation. A total of Versed 0.5 mg and Fentanyl 0 mcg was administered intravenously. Moderate Sedation Time: 0 minutes. The patient's level of consciousness and vital signs were monitored continuously by radiology nursing throughout the procedure under my direct supervision. FLUOROSCOPY TIME:  Fluoroscopy Time: 0 minutes 54 seconds (3 mGy). COMPLICATIONS: None PROCEDURE: Informed written consent was obtained from the patient after a discussion of the risks, benefits, and  alternatives to treatment. Questions regarding the procedure were encouraged and answered. The right neck and chest were prepped with chlorhexidine in a sterile fashion, and a sterile drape was applied covering the operative field. Maximum barrier sterile technique with sterile gowns and gloves were used for the procedure. A timeout was performed prior to the initiation of the procedure. Ultrasound survey was performed. Micropuncture kit was utilized to access the right internal jugular vein under direct, real-time ultrasound guidance after the overlying soft tissues were anesthetized with 1% lidocaine with epinephrine. Stab incision was made with 11 blade scalpel. Microwire was passed centrally. The microwire was then marked to measure appropriate internal catheter length. External tunneled length was estimated. A total tip to cuff length of 19 cm was selected. 035 guidewire was advanced to  the level of the IVC. Skin and subcutaneous tissues of chest wall below the clavicle were generously infiltrated with 1% lidocaine for local anesthesia. A small stab incision was made with 11 blade scalpel. The selected hemodialysis catheter was tunneled in a retrograde fashion from the anterior chest wall to the venotomy incision. Serial dilation was performed and then a peel-away sheath was placed. The catheter was then placed through the peel-away sheath with tips ultimately positioned within the superior aspect of the right atrium. Final catheter positioning was confirmed and documented with a spot radiographic image. The catheter aspirates and flushes normally. The catheter was flushed with appropriate volume heparin dwells. The catheter exit site was secured with a 0-Prolene retention suture. Gel-Foam slurry was infused into the soft tissue tract. The venotomy incision was closed Derma bond and sterile dressing. Dressings were applied at the chest wall. Patient tolerated the procedure well and remained hemodynamically stable throughout. No complications were encountered and no significant blood loss encountered. IMPRESSION: Status post right IJ tunneled hemodialysis catheter placement. Catheter ready for use. Signed, Dulcy Fanny. Dellia Nims, RPVI [facility] Electronically Signed   By: Corrie Mckusick D.O.   On: 12/17/2020 15:26   IR US Guide Vasc Access Left  Result Date: 12/17/2020 INDICATION: 60 year old male referred for hemodialysis catheter placement EXAM: TUNNELED CENTRAL VENOUS HEMODIALYSIS CATHETER PLACEMENT WITH ULTRASOUND AND FLUOROSCOPIC GUIDANCE MEDICATIONS: 1 g vancomycin. The antibiotic was given in an appropriate time interval prior to skin puncture. ANESTHESIA/SEDATION: No moderate sedation. A total of Versed 0.5 mg and Fentanyl 0 mcg was administered intravenously. Moderate Sedation Time: 0 minutes. The patient's level of consciousness  and vital signs were monitored continuously by radiology nursing throughout the procedure under my direct supervision. FLUOROSCOPY TIME:  Fluoroscopy Time: 0 minutes 54 seconds (3 mGy). COMPLICATIONS: None PROCEDURE: Informed written consent was obtained from the patient after a discussion of the risks, benefits, and alternatives to treatment. Questions regarding the procedure were encouraged and answered. The right neck and chest were prepped with chlorhexidine in a sterile fashion, and a sterile drape was applied covering the operative field. Maximum barrier sterile technique with sterile gowns and gloves were used for the procedure. A timeout was performed prior to the initiation of the procedure. Ultrasound survey was performed. Micropuncture kit was utilized to access the right internal jugular vein under direct, real-time ultrasound guidance after the overlying soft tissues were anesthetized with 1% lidocaine with epinephrine. Stab incision was made with 11 blade scalpel. Microwire was passed centrally. The microwire was then marked to measure appropriate internal catheter length. External tunneled length was estimated. A total tip to cuff length of 19 cm was selected. 035 guidewire  was advanced to the level of the IVC. Skin and subcutaneous tissues of chest wall below the clavicle were generously infiltrated with 1% lidocaine for local anesthesia. A small stab incision was made with 11 blade scalpel. The selected hemodialysis catheter was tunneled in a retrograde fashion from the anterior chest wall to the venotomy incision. Serial dilation was performed and then a peel-away sheath was placed. The catheter was then placed through the peel-away sheath with tips ultimately positioned within the superior aspect of the right atrium. Final catheter positioning was confirmed and documented with a spot radiographic image. The catheter aspirates and flushes normally. The catheter was flushed with appropriate volume  heparin dwells. The catheter exit site was secured with a 0-Prolene retention suture. Gel-Foam slurry was infused into the soft tissue tract. The venotomy incision was closed Derma bond and sterile dressing. Dressings were applied at the chest wall. Patient tolerated the procedure well and remained hemodynamically stable throughout. No complications were encountered and no significant blood loss encountered. IMPRESSION: Status post right IJ tunneled hemodialysis catheter placement. Catheter ready for use. Signed, Dulcy Fanny. Dellia Nims, RPVI [facility] Radiology Electronically Signed   By: Corrie Mckusick D.O.   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 ANESTHESIA/SEDATION: Fentanyl 100 mcg IV; Versed 4 mg IV Sedation time: 14 minutes; The patient was continuously monitored during the procedure by the interventional radiology nurse under my direct supervision. CONTRAST:  None. COMPLICATIONS: SIR Level A - No therapy, no consequence. Retroperitoneal hemorrhage at site of biopsy, controlled with Gel-Foam slurry administration along needle track. PROCEDURE: Informed consent was obtained from the patient following an explanation of the procedure, risks, benefits and alternatives. A time out was performed prior to the initiation of the procedure. The patient was positioned in a partial left lateral decubitus position on the CT table and a limited CT was performed for procedural planning demonstrating similar appearing multifocal right retroperitoneal lymphadenopathy. The procedure was planned. The operative site was prepped and draped in the usual sterile fashion. Appropriate trajectory was confirmed with a 22 gauge spinal needle after the adjacent  tissues were anesthetized with 1% Lidocaine with epinephrine. Under intermittent CT guidance, a 17 gauge coaxial needle was advanced into the peripheral aspect of the mass. Appropriate positioning was confirmed and a total of 4 samples were obtained with an 18 gauge core needle biopsy device. A limited CT demonstrated focal hemorrhage about the biopsied lymph node. Gel-Foam slurry was injected through the introducer needle along the needle track. The co-axial needle was removed and hemostasis was achieved with manual compression. Additional limited postprocedural CT was negative for expanding hemorrhage or additional complication. A dressing was placed. The patient tolerated the procedure well without immediate postprocedural complication. IMPRESSION: Technically successful CT guided core needle biopsy of right retroperitoneal lymph node. Ruthann Cancer, MD [facility] Radiology Electronically Signed   By: Ruthann Cancer MD   On: 12/07/2020 08:53   DG CHEST PORT 1 VIEW  Result Date: 12/10/2020 CLINICAL DATA:  Hypoxia.  History of cardiac transplant EXAM: PORTABLE CHEST 1 VIEW COMPARISON:  12/27/2020 FINDINGS: Prior median sternotomy. Heart size within normal limits. Atherosclerotic calcification of the aortic knob. Subtle right lower lobe opacity, slightly improved from prior. Trace right pleural effusion. No pneumothorax. IMPRESSION: 1. Subtle right lower lobe  opacity, slightly improved from prior. 2. Trace right pleural effusion. Electronically Signed   By: Davina Poke D.O.   On: 12/10/2020 10:33   DG C-Arm 1-60 Min-No Report  Result Date: 11/29/2020 Fluoroscopy was utilized by the requesting physician.  No radiographic interpretation.   ECHOCARDIOGRAM COMPLETE  Result Date: 11/30/2020    ECHOCARDIOGRAM REPORT   Patient Name:   JOSEP LUVIANO Rabinovich Date of Exam: 11/30/2020 Medical Rec #:  [id number]         Height:       74.0 in Accession #:     [id number]        Weight:       221.0 lb Date of Birth:  Apr 02, 1961        BSA:          2.268 m Patient Age:    28 years          BP:           103/59 mmHg Patient Gender: M                 HR:           94 bpm. Exam Location:  Inpatient Procedure: 2D Echo, Cardiac Doppler, Color Doppler and Intracardiac            Opacification Agent Indications:    Dyspnea R06.00  History:        Patient has prior history of Echocardiogram examinations, most                 recent 05/12/2017. Arrythmias:Atrial Fibrillation; Risk                 Factors:Non-Smoker. GERD. 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  Sonographer:    Vickie Epley RDCS Referring Phys: [id number] [facility]  Sonographer Comments: Suboptimal apical window and suboptimal subcostal window. Pt unable to turn on side due to herniated discs. IMPRESSIONS  1. Left ventricular ejection fraction, by estimation, is 60 to 65%. The left ventricle has normal function. The left ventricle has no regional wall motion abnormalities. Left ventricular diastolic parameters were normal.  2. Right ventricular systolic function is normal. The right ventricular size is normal. There is normal pulmonary artery systolic pressure. The estimated right ventricular systolic pressure is 22.2 mmHg.  3. The mitral valve is normal in structure. No evidence of mitral valve regurgitation. No evidence of mitral stenosis.  4. The aortic valve is normal in structure. Aortic valve regurgitation is not visualized. No aortic stenosis is present.  5. The inferior vena cava is normal in size with greater than 50% respiratory variability, suggesting right atrial pressure of 3 mmHg. FINDINGS  Left Ventricle: Left ventricular ejection fraction, by estimation, is 60 to 65%. The left ventricle has normal function. The left ventricle has no regional wall motion abnormalities. Definity contrast agent was given IV to delineate the left ventricular  endocardial borders. The left ventricular internal cavity size was  normal in size. There is no left ventricular hypertrophy. Left ventricular diastolic parameters were normal. Normal left ventricular filling pressure. Right Ventricle: The right ventricular size is normal. No increase in right ventricular wall thickness. Right ventricular systolic function is normal. There is normal pulmonary artery systolic pressure. The tricuspid regurgitant velocity is 2.39 m/s, and  with an assumed right atrial pressure of 3 mmHg, the estimated right ventricular systolic pressure is 97.9 mmHg. Left Atrium: Left atrial size was normal in size. Right Atrium: Right atrial size was normal in size. Pericardium: There  is no evidence of pericardial effusion. Mitral Valve: The mitral valve is normal in structure. No evidence of mitral valve regurgitation. No evidence of mitral valve stenosis. Tricuspid Valve: The tricuspid valve is normal in structure. Tricuspid valve regurgitation is mild . No evidence of tricuspid stenosis. Aortic Valve: The aortic valve is normal in structure. Aortic valve regurgitation is not visualized. No aortic stenosis is present. Pulmonic Valve: The pulmonic valve was normal in structure. Pulmonic valve regurgitation is not visualized. No evidence of pulmonic stenosis. Aorta: The aortic root is normal in size and structure. Venous: The inferior vena cava is normal in size with greater than 50% respiratory variability, suggesting right atrial pressure of 3 mmHg. IAS/Shunts: No atrial level shunt detected by color flow Doppler.  LEFT VENTRICLE PLAX 2D LVIDd:         4.40 cm     Diastology LVIDs:         3.20 cm     LV e' medial:    10.00 cm/s LV PW:         0.90 cm     LV E/e' medial:  7.5 LV IVS:        0.90 cm     LV e' lateral:   11.30 cm/s LVOT diam:     2.50 cm     LV E/e' lateral: 6.7 LV SV:         48 LV SV Index:   21 LVOT Area:     4.91 cm  LV Volumes (MOD) LV vol d, MOD A4C: 80.2 ml LV vol s, MOD A4C: 30.4 ml LV SV MOD A4C:     80.2 ml LEFT ATRIUM           Index LA  diam:      2.70 cm 1.19 cm/m LA Vol (A4C): 16.3 ml 7.19 ml/m  AORTIC VALVE LVOT Vmax:   50.20 cm/s LVOT Vmean:  36.700 cm/s LVOT VTI:    0.099 m  AORTA Ao Root diam: 3.40 cm MITRAL VALVE               TRICUSPID VALVE MV Area (PHT): 5.38 cm    TR Peak grad:   22.8 mmHg MV Decel Time: 141 msec    TR Vmax:        239.00 cm/s MV E velocity: 75.40 cm/s MV A velocity: 36.00 cm/s  SHUNTS MV E/A ratio:  2.09        Systemic VTI:  0.10 m                            Systemic Diam: 2.50 cm Dani Gobble Croitoru MD Electronically signed by Sanda Klein MD Signature Date/Time: 11/30/2020/12:30:40 PM    Final    VAS [place] LOWER EXTREMITY VENOUS (DVT)  Result Date: 12/10/2020  Lower Venous DVT Study Indications: Pain.  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 Comparison Study: Previous 12/02/20 Negative Performing Technologist: Vonzell Schlatter RVT  Examination Guidelines: A complete evaluation includes B-mode imaging, spectral Doppler, color Doppler, and power Doppler as needed of all accessible portions of each vessel. Bilateral testing is considered an integral part of a complete examination. Limited examinations for reoccurring indications may be performed as noted. The reflux portion of the exam is performed with the patient in reverse Trendelenburg.  +---------+---------------+---------+-----------+----------+--------------+ RIGHT    CompressibilityPhasicitySpontaneityPropertiesThrombus Aging +---------+---------------+---------+-----------+----------+--------------+ CFV      Full  Yes      Yes                                 +---------+---------------+---------+-----------+----------+--------------+ SFJ      Full                                                        +---------+---------------+---------+-----------+----------+--------------+ FV Prox  Full                                                         +---------+---------------+---------+-----------+----------+--------------+ FV Mid   Full                                                        +---------+---------------+---------+-----------+----------+--------------+ FV DistalFull                                                        +---------+---------------+---------+-----------+----------+--------------+ PFV      Full                                                        +---------+---------------+---------+-----------+----------+--------------+ POP      Full           Yes      Yes                                 +---------+---------------+---------+-----------+----------+--------------+ PTV      None                                                        +---------+---------------+---------+-----------+----------+--------------+ PERO     None                                                        +---------+---------------+---------+-----------+----------+--------------+   +---------+---------------+---------+-----------+----------+--------------+ LEFT     CompressibilityPhasicitySpontaneityPropertiesThrombus Aging +---------+---------------+---------+-----------+----------+--------------+ CFV      Full           Yes      Yes                                 +---------+---------------+---------+-----------+----------+--------------+ SFJ  Full                                                        +---------+---------------+---------+-----------+----------+--------------+ FV Prox  Full                                                        +---------+---------------+---------+-----------+----------+--------------+ FV Mid   Full                                                        +---------+---------------+---------+-----------+----------+--------------+ FV DistalFull                                                         +---------+---------------+---------+-----------+----------+--------------+ PFV      Full                                                        +---------+---------------+---------+-----------+----------+--------------+ POP      Full           Yes      Yes                                 +---------+---------------+---------+-----------+----------+--------------+ PTV      Full                                                        +---------+---------------+---------+-----------+----------+--------------+ PERO     Full                                                        +---------+---------------+---------+-----------+----------+--------------+     Summary: RIGHT: - Findings consistent with acute deep vein thrombosis involving one right posterior tibial vein, and both right peroneal veins. - No cystic structure found in the popliteal fossa.  LEFT: - There is no evidence of deep vein thrombosis in the lower extremity.  - No cystic structure found in the popliteal fossa.  *See table(s) above for measurements and observations. Electronically signed by Jamelle Haring on 12/10/2020 at 7:16:00 PM.    Final    VAS [place] LOWER EXTREMITY VENOUS (DVT)  Result Date: 12/03/2020  Lower Venous DVT Study Indications: Pain.  Comparison Study: Previous 12/2019 Performing Technologist: Vonzell Schlatter RVT  Examination Guidelines: A complete evaluation includes  B-mode imaging, spectral Doppler, color Doppler, and power Doppler as needed of all accessible portions of each vessel. Bilateral testing is considered an integral part of a complete examination. Limited examinations for reoccurring indications may be performed as noted. The reflux portion of the exam is performed with the patient in reverse Trendelenburg.  +---------+---------------+---------+-----------+----------+--------------+ RIGHT    CompressibilityPhasicitySpontaneityPropertiesThrombus Aging  +---------+---------------+---------+-----------+----------+--------------+ CFV      Full           Yes      Yes                                 +---------+---------------+---------+-----------+----------+--------------+ SFJ      Full                                                        +---------+---------------+---------+-----------+----------+--------------+ FV Prox  Full                                                        +---------+---------------+---------+-----------+----------+--------------+ FV Mid   Full                                                        +---------+---------------+---------+-----------+----------+--------------+ FV DistalFull                                                        +---------+---------------+---------+-----------+----------+--------------+ PFV      Full                                                        +---------+---------------+---------+-----------+----------+--------------+ POP      Full           Yes      Yes                                 +---------+---------------+---------+-----------+----------+--------------+ PTV      Full                                                        +---------+---------------+---------+-----------+----------+--------------+ PERO     Full                                                        +---------+---------------+---------+-----------+----------+--------------+   +---------+---------------+---------+-----------+----------+--------------+  LEFT     CompressibilityPhasicitySpontaneityPropertiesThrombus Aging +---------+---------------+---------+-----------+----------+--------------+ CFV      Full           Yes      Yes                                 +---------+---------------+---------+-----------+----------+--------------+ SFJ      Full                                                         +---------+---------------+---------+-----------+----------+--------------+ FV Prox  Full                                                        +---------+---------------+---------+-----------+----------+--------------+ FV Mid   Full                                                        +---------+---------------+---------+-----------+----------+--------------+ FV DistalFull                                                        +---------+---------------+---------+-----------+----------+--------------+ PFV      Full                                                        +---------+---------------+---------+-----------+----------+--------------+ POP      Full           Yes      Yes                                 +---------+---------------+---------+-----------+----------+--------------+ PTV      Full                                                        +---------+---------------+---------+-----------+----------+--------------+ PERO     Full                                                        +---------+---------------+---------+-----------+----------+--------------+     Summary: RIGHT: - There is no evidence of deep vein thrombosis in the lower extremity.  - No cystic structure found in the popliteal fossa.  LEFT: - There is no evidence of deep vein thrombosis in the lower extremity.  - No  cystic structure found in the popliteal fossa.  *See table(s) above for measurements and observations. Electronically signed by Ruta Hinds MD on 12/03/2020 at 12:15:41 PM.    Final    ECHOCARDIOGRAM LIMITED  Result Date: 12/10/2020    ECHOCARDIOGRAM LIMITED REPORT   Patient Name:   TYREQUE FINKEN Bullen Date of Exam: 12/10/2020 Medical Rec #:  [id number]         Height:       74.0 in Accession #:    [id number]        Weight:       230.4 lb Date of Birth:  [birth year]        BSA:          2.308 m Patient Age:    82 years          BP:           105/72 mmHg Patient Gender: M                  HR:           99 bpm. Exam Location:  Inpatient Procedure: Limited Echo, Cardiac Doppler and Color Doppler Indications:    Acute DVT  History:        Patient has prior history of Echocardiogram examinations, most                 recent 11/30/2020. Arrythmias:Atrial Fibrillation. CKD. GERD.                 S/P heart transplant 01/22/16.  Sonographer:    Clayton Lefort RDCS (AE) Referring Phys: [id number] [facility] D NETTEY  Sonographer Comments: Technically difficult study due to poor echo windows, suboptimal apical window and suboptimal subcostal window. Unable to roll patient due to herniated discs. IMPRESSIONS  1. S/P Heart Transplant. Left ventricular ejection fraction, by estimation, is 60 to 65%. The left ventricle has normal function. Left ventricular endocardial border not optimally defined to evaluate regional wall motion. There is moderate concentric left ventricular hypertrophy. Left ventricular diastolic function could not be evaluated.  2. Right ventricular systolic function is moderately reduced. The right ventricular size is mildly enlarged. Tricuspid regurgitation signal is inadequate for assessing PA pressure.  3. A small pericardial effusion is present. The pericardial effusion is surrounding the apex.  4. Tricuspid valve regurgitation is mild to moderate. FINDINGS  Left Ventricle: S/P Heart Transplant. Left ventricular ejection fraction, by estimation, is 60 to 65%. The left ventricle has normal function. Left ventricular endocardial border not optimally defined to evaluate regional wall motion. There is moderate concentric left ventricular hypertrophy. Left ventricular diastolic function could not be evaluated. Right Ventricle: The right ventricular size is mildly enlarged. Right ventricular systolic function is moderately reduced. Tricuspid regurgitation signal is inadequate for assessing PA pressure. Pericardium: A small pericardial effusion is present. The pericardial effusion is surrounding  the apex. Tricuspid Valve: Tricuspid valve regurgitation is mild to moderate. Venous: The inferior vena cava was not well visualized. LEFT VENTRICLE PLAX 2D LVIDd:         3.60 cm LVIDs:         2.30 cm LV PW:         1.40 cm LV IVS:        1.50 cm LVOT diam:     2.50 cm LVOT Area:     4.91 cm  LEFT ATRIUM         Index LA diam:    2.80 cm 1.21 cm/m  AORTA Ao Root diam: 3.70 cm Ao Asc diam:  3.10 cm TRICUSPID VALVE TR Peak grad:   52.4 mmHg TR Vmax:        362.00 cm/s  SHUNTS Systemic Diam: 2.50 cm Fransico Him MD Electronically signed by Fransico Him MD Signature Date/Time: 12/10/2020/3:35:06 PM    Final     Lab Data:  CBC: Recent Labs  Lab 12/16/20 0857 12/17/20 0319 12/18/20 0409 12/19/20 0720 12/20/20 0527  WBC 23.5* 22.1* 24.1* 26.8* 25.7*  HGB 14.3 14.5 13.9 13.3 12.4*  HCT 44.1 43.8 41.8 38.1* 37.6*  MCV 95.9 94.0 93.9 93.2 94.5  PLT 472* 443* 360 366 778   Basic Metabolic Panel: Recent Labs  Lab 12/16/20 0857 12/17/20 0319 12/18/20 0409 12/19/20 0720 12/20/20 0527  NA 123* 122* 125* 125* 128*  K 5.9* 5.9* 5.2* 5.4* 4.8  CL 86* 87* 89* 90* 93*  CO2 20* 18* 21* 20* 21*  GLUCOSE 138* 122* 105* 118* 127*  BUN 105* 120* 100* 117* 84*  CREATININE 4.68* 5.26* 4.56* 4.78* 4.04*  CALCIUM 8.9 8.6* 8.6* 8.5* 8.4*   GFR: Estimated Creatinine Clearance: 25.2 mL/min (A) (by C-G formula based on SCr of 4.04 mg/dL (H)). Liver Function Tests: No results for input(s): AST, ALT, ALKPHOS, BILITOT, PROT, ALBUMIN in the last 168 hours. No results for input(s): LIPASE, AMYLASE in the last 168 hours. No results for input(s): AMMONIA in the last 168 hours. Coagulation Profile: No results for input(s): INR, PROTIME in the last 168 hours. Cardiac Enzymes: No results for input(s): CKTOTAL, CKMB, CKMBINDEX, TROPONINI in the last 168 hours. BNP (last 3 results) No results for input(s): PROBNP in the last 8760 hours. HbA1C: No results for input(s): HGBA1C in the last 72 hours. CBG: No  results for input(s): GLUCAP in the last 168 hours. Lipid Profile: No results for input(s): CHOL, HDL, LDLCALC, TRIG, CHOLHDL, LDLDIRECT in the last 72 hours. Thyroid Function Tests: No results for input(s): TSH, T4TOTAL, FREET4, T3FREE, THYROIDAB in the last 72 hours. Anemia Panel: No results for input(s): VITAMINB12, FOLATE, FERRITIN, TIBC, IRON, RETICCTPCT in the last 72 hours. Urine analysis:    Component Value Date/Time   COLORURINE YELLOW 12/15/2020 1557   APPEARANCEUR CLOUDY (A) 12/15/2020 1557   LABSPEC 1.011 12/15/2020 1557   PHURINE 5.0 12/15/2020 1557   GLUCOSEU NEGATIVE 12/15/2020 1557   HGBUR LARGE (A) 12/15/2020 1557   HGBUR negative 05/12/2008 0957   BILIRUBINUR NEGATIVE 12/15/2020 [place] 12/15/2020 1557   PROTEINUR 30 (A) 12/15/2020 1557   UROBILINOGEN 0.2 07/31/2015 2311   NITRITE NEGATIVE 12/15/2020 1557   LEUKOCYTESUR TRACE (A) 12/15/2020 1557     Annalei Friesz M.D. [facility] 12/20/2020, 3:24 PM   Call night coverage person covering after 7pm

## 2020-12-20 NOTE — Progress Notes (Signed)
Secure chat informed at 2145 bladder scan for 2 ml and not urine out put. Arthor Captain LPN

## 2020-12-20 NOTE — Progress Notes (Signed)
Patient ID: Glenn Gonzalez, male   DOB: 01-18-1961, 60 y.o.   MRN: [id number]   Reviewed medical records  This NP visited patient at the bedside as a follow up for palliative medicine needs and emotional support. Initial PMT consult on 12-09-20  Glenn Gonzalez a 60 y.o.year old malewith medical history significant for Hodgkin lymphoma/remote, cardiac transplant on tacrolimus and prednisone, chronic combined systolic/diastolic CHF, GERD, high-grade T1 bladder cancer with recent hospitalization from 51/76-1/6 for AKI complicated by hydronephrosis in the setting of new retroperitoneal lymphadenopathy. Patient underwent stent placement by urology and was treated for postoperative multifocal pneumonia.  He presented from urology office with reports of increased back pain not well controlled on oral pain medication as well as generalized fatigue, poor appetite. He was brought in under observation due to concern for back pain in the setting of known retroperitoneal lymphadenopathy.  Hospital course complicated by acute hyponatremia in the setting of poor appetite requiring IV fluids and persistent leukocytosis. CT abdomen imaging consistent with retroperitoneal adenopathy and dilation of right renal pelvis and collecting systems concerning for urothelial neoplasm with extensive metastatic process. Found to have acute right lower extremity DVT on venous duplex started on heparin on 1/10.Has opted for dialysis while awaiting [facility] response.  1/17: Tunneled dialysis cath placed by IR, initiated hemodialysis, first 1/17, tolerated 1/18: second hemodialysis planned  Complex serious medical situation with overall poor prognosis.   Patient and his family face treatment option decisions, advanced directive decisions and anticipatory care needs.  Initially I met with patient alone, he reports his pain is controlled on current medications.  His main concern was renal function on viable cancer treatment  options.  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  Molecular studies pending. Signs of hepatic involvement with new lesions in the RIGHT hepatic lobe.  Oncology plan: -The patient was started on Padcev day 1 of cycle #1 on 12/14/2020.  He is due for day 8 of cycle #1 on 12/21/2020.   -follow-up on molecular studies and PD-L1 testing. -Request for second opinion at [facility] has been sent.  Awaiting appointment to be arranged. -He is aware that this is an incurable condition. -Continue hemodialysis per nephrology.  Education had regarding the limited options 2/2 to renal excreted argents and his heart  transplant protocols.  Later in the afternoon I met at the bedside with his wife Lattie Haw present.  Both understand the seriousness of his current medical situation.   Education/conversation offered as they consider possible future outcomes and the limitations of medical interventions to prolong life.  Wife verbalizes support for her husbands choices  Discussed with patient the importance of continued conversation with his family and the medical providers regarding overall plan of care and treatment options,  ensuring decisions are within the context of the patients values and GOCs.  Questions and concerns addressed   Discussed with Emelda Fear NP, Dr Irene Limbo and Dr Joelyn Oms  Total time spent on the unit was 45 minutes  This nurse practitioner informed  the patient/family  that I will be out of the hospital until Monday morning.   I will follow-up at that time.  Call palliative medicine team phone # [phone] with questions or concerns in the interim  Greater than 50% of the time was spent in counseling and coordination of care  Wadie Lessen NP  Palliative Medicine Team Team Phone # [phone]  Pager [phone]

## 2020-12-20 NOTE — Plan of Care (Signed)
  Problem: Health Behavior/Discharge Planning: Goal: Ability to manage health-related needs will improve Outcome: Progressing   Problem: Clinical Measurements: Goal: Ability to maintain clinical measurements within normal limits will improve Outcome: Progressing Goal: Will remain free from infection Outcome: Progressing Goal: Diagnostic test results will improve Outcome: Progressing Goal: Respiratory complications will improve Outcome: Progressing   Problem: Activity: Goal: Risk for activity intolerance will decrease Outcome: Progressing   Problem: Nutrition: Goal: Adequate nutrition will be maintained Outcome: Progressing   Problem: Coping: Goal: Level of anxiety will decrease Outcome: Progressing   Problem: Elimination: Goal: Will not experience complications related to bowel motility Outcome: Progressing   Problem: Pain Managment: Goal: General experience of comfort will improve Outcome: Progressing   Problem: Safety: Goal: Ability to remain free from injury will improve Outcome: Progressing   Problem: Skin Integrity: Goal: Risk for impaired skin integrity will decrease Outcome: Progressing   Problem: Elimination: Goal: Will not experience complications related to urinary retention Outcome: Progressing   Problem: Education: Goal: Knowledge of the prescribed therapeutic regimen will improve Outcome: Progressing

## 2020-12-20 NOTE — Progress Notes (Signed)
Renal Navigator aware of patient's need for outpatient HD referral for AKI treatment, however, we are still evaluating where to place patient at this time as outpatient HD seats are unfortunately extremely limited at this time. Navigator working on referral and will update as information is available.   Glenn Gonzalez, [place] [facility] [phone]

## 2020-12-21 DIAGNOSIS — R140 Abdominal distension (gaseous): Secondary | ICD-10-CM | POA: Diagnosis not present

## 2020-12-21 DIAGNOSIS — C7910 Secondary malignant neoplasm of unspecified urinary organs: Secondary | ICD-10-CM | POA: Diagnosis not present

## 2020-12-21 DIAGNOSIS — I82441 Acute embolism and thrombosis of right tibial vein: Secondary | ICD-10-CM | POA: Diagnosis not present

## 2020-12-21 DIAGNOSIS — Z5111 Encounter for antineoplastic chemotherapy: Secondary | ICD-10-CM | POA: Diagnosis not present

## 2020-12-21 DIAGNOSIS — N179 Acute kidney failure, unspecified: Secondary | ICD-10-CM | POA: Diagnosis not present

## 2020-12-21 LAB — COMPREHENSIVE METABOLIC PANEL
ALT: 13 U/L (ref 0–44)
AST: 37 U/L (ref 15–41)
Albumin: 2.9 g/dL — ABNORMAL LOW (ref 3.5–5.0)
Alkaline Phosphatase: 49 U/L (ref 38–126)
Anion gap: 14 (ref 5–15)
BUN: 104 mg/dL — ABNORMAL HIGH (ref 6–20)
CO2: 21 mmol/L — ABNORMAL LOW (ref 22–32)
Calcium: 8.6 mg/dL — ABNORMAL LOW (ref 8.9–10.3)
Chloride: 92 mmol/L — ABNORMAL LOW (ref 98–111)
Creatinine, Ser: 4.42 mg/dL — ABNORMAL HIGH (ref 0.61–1.24)
GFR, Estimated: 15 mL/min — ABNORMAL LOW (ref >60)
Glucose, Bld: 110 mg/dL — ABNORMAL HIGH (ref 70–99)
Potassium: 5 mmol/L (ref 3.5–5.1)
Sodium: 127 mmol/L — ABNORMAL LOW (ref 135–145)
Total Bilirubin: 0.6 mg/dL (ref 0.3–1.2)
Total Protein: 5.7 g/dL — ABNORMAL LOW (ref 6.5–8.1)

## 2020-12-21 LAB — CBC WITH DIFFERENTIAL/PLATELET
Abs Immature Granulocytes: 0.35 10*3/uL — ABNORMAL HIGH (ref 0.00–0.07)
Basophils Absolute: 0 10*3/uL (ref 0.0–0.1)
Basophils Relative: 0 %
Eosinophils Absolute: 0 10*3/uL (ref 0.0–0.5)
Eosinophils Relative: 0 %
HCT: 40.3 % (ref 39.0–52.0)
Hemoglobin: 13.2 g/dL (ref 13.0–17.0)
Immature Granulocytes: 1 %
Lymphocytes Relative: 2 %
Lymphs Abs: 0.4 10*3/uL — ABNORMAL LOW (ref 0.7–4.0)
MCH: 31.3 pg (ref 26.0–34.0)
MCHC: 32.8 g/dL (ref 30.0–36.0)
MCV: 95.5 fL (ref 80.0–100.0)
Monocytes Absolute: 2.4 10*3/uL — ABNORMAL HIGH (ref 0.1–1.0)
Monocytes Relative: 10 %
Neutro Abs: 22.3 10*3/uL — ABNORMAL HIGH (ref 1.7–7.7)
Neutrophils Relative %: 87 %
Platelets: 355 10*3/uL (ref 150–400)
RBC: 4.22 MIL/uL (ref 4.22–5.81)
RDW: 16 % — ABNORMAL HIGH (ref 11.5–15.5)
WBC: 25.4 10*3/uL — ABNORMAL HIGH (ref 4.0–10.5)
nRBC: 0.1 % (ref 0.0–0.2)

## 2020-12-21 LAB — HEPARIN LEVEL (UNFRACTIONATED)
Heparin Unfractionated: 0.29 IU/mL — ABNORMAL LOW (ref 0.30–0.70)
Heparin Unfractionated: 0.35 IU/mL (ref 0.30–0.70)

## 2020-12-21 MED ORDER — ALBUMIN HUMAN 25 % IV SOLN
INTRAVENOUS | Status: AC
  Filled 2020-12-21: qty 100

## 2020-12-21 MED ORDER — ALBUMIN HUMAN 25 % IV SOLN
25.0000 g | Freq: Once | INTRAVENOUS | Status: AC
  Administered 2020-12-21: 25 g via INTRAVENOUS

## 2020-12-21 MED ORDER — ENFORTUMAB VEDOTIN-EJFV CHEMO 20 MG IV SOLR
120.0000 mg | Freq: Once | INTRAVENOUS | Status: AC
  Administered 2020-12-21: 120 mg via INTRAVENOUS
  Filled 2020-12-21: qty 12

## 2020-12-21 MED ORDER — DRONABINOL 2.5 MG PO CAPS
2.5000 mg | ORAL_CAPSULE | Freq: Two times a day (BID) | ORAL | Status: DC
  Administered 2020-12-21 – 2021-01-01 (×16): 2.5 mg via ORAL
  Filled 2020-12-21 (×18): qty 1

## 2020-12-21 MED ORDER — HEPARIN SODIUM (PORCINE) 1000 UNIT/ML IJ SOLN: 1000.0000 [IU] | Freq: Once | INTRAMUSCULAR | Status: AC

## 2020-12-21 MED ORDER — PALONOSETRON HCL INJECTION 0.25 MG/5ML
0.2500 mg | Freq: Once | INTRAVENOUS | Status: AC
  Administered 2020-12-21: 0.25 mg via INTRAVENOUS
  Filled 2020-12-21: qty 5

## 2020-12-21 MED ORDER — HEPARIN SODIUM (PORCINE) 1000 UNIT/ML IJ SOLN
INTRAMUSCULAR | Status: AC
  Administered 2020-12-21: 1000 [IU] via INTRAVENOUS
  Filled 2020-12-21: qty 1

## 2020-12-21 MED ORDER — SODIUM CHLORIDE 0.9 % IV SOLN: Freq: Once | INTRAVENOUS | Status: DC

## 2020-12-21 MED ORDER — SODIUM CHLORIDE 0.9 % IV SOLN
10.0000 mg | Freq: Once | INTRAVENOUS | Status: AC
  Administered 2020-12-21: 10 mg via INTRAVENOUS
  Filled 2020-12-21: qty 1

## 2020-12-21 MED ORDER — MIDODRINE HCL 5 MG PO TABS
ORAL_TABLET | ORAL | Status: AC
  Filled 2020-12-21: qty 2

## 2020-12-21 MED ORDER — MIDODRINE HCL 5 MG PO TABS
10.0000 mg | ORAL_TABLET | ORAL | Status: DC | PRN
  Administered 2020-12-24: 10 mg via ORAL
  Filled 2020-12-21 (×2): qty 2

## 2020-12-21 NOTE — Progress Notes (Signed)
[facility]                                                                              Patient Demographics  Glenn Gonzalez, is a 60 y.o. male, DOB - [birth year], AJO:[id number]  Admit date - 12/20/2020   Admitting Physician Glenn Hane, MD  Outpatient Primary MD for the patient is Glenn Manes, MD  Outpatient specialists:   LOS - 14  days   Medical records reviewed and are as summarized below:    No chief complaint on file.      Brief summary  Glenn Gonzalez Duehringis a 60 y.o.year old malewith medical history significant for Hodgkin lymphoma, cardiac transplant on tacrolimus and prednisone, chronic combined systolic/diastolic CHF, GERD, high-grade T1 bladder cancer with recent hospitalization from 94/70-9/6 for AKI complicated by hydronephrosis in the setting of new retroperitoneal lymphadenopathy. Patient underwent stent placement by urology and was treated for postoperative multifocal pneumonia.  He presented from urology office with reports of increased back pain not well controlled on oral pain medication as well as generalized fatigue, poor appetite. He was brought in under observation due to concern for back pain in the setting of known retroperitoneal lymphadenopathy.  Hospital course complicated by acute hyponatremia in the setting of poor appetite requiring IV fluids and persistent leukocytosis. CT abdomen imaging consistent with retroperitoneal adenopathy and dilation of right renal pelvis and collecting systems concerning for urothelial neoplasm with extensive metastatic process. Found to have acute right lower extremity DVT on venous duplex started on heparin on 1/10. Has opted for dialysis while awaiting [facility] response.  1/17: Tunneled dialysis cath placed by IR, initiated hemodialysis, first 1/17, tolerated 1/18: second hemodialysis planned 1/21: Chemotherapy, second planned today 1/21, undergoing HD  Assessment & Plan    Principal  Problem: Retroperitoneal adenopathy and perinephric nodularity, high-grade bladder CA, recently diagnosed -Status post IR guided retroperitoneal biopsy, pathology positive for poorly differentiated carcinoma.  Recent hospitalization 28/36-6/2 for AKI complicated by hydronephrosis in the setting of new retroperitoneal LAD, underwent stent placement. - Urology and oncology following. - Oncology consulted, sent for molecular testing and hoping to discuss with [facility] oncology regarding patient's options --Startedon palliative chemo 1/14, palliative medicine following -  C1D8 Padcev chemo planned today on 1/21  AKI on CKD stage 3a, now ? dialysis dependent, new -Baseline creatinine 1.2-1.4 from 08/2020, 1.8 from last hospitalization -Suspect lymphatic obstruction due to malignancy, low albumin, renal function worsening over the last few weeks.  Nephrology was consulted. - Lasix was added for volume overload though not effective.  2D echo showed normal EF - 1/17: IR consulted and tunneled HD cath placed, initiated on HD, tolerated -1/18: Tolerated hemodialysis, 1/19 undergoing hemodialysis without any issues -1/20, patient reported urinary retention overnight, renal ultrasound obtained which showed similar right-sided hydronephrosis, no acute worsening.  Appreciate urology recommendations, recommended Foley catheter if starts having retention again and leave it in for 1 to 2 weeks for better decompression. Abdominal x-ray did not show any stent dislodgment -Per patient he is urinating with no issues today.  Hyperkalemia -Was placed on Lokelma twice daily, now on HD, nephrology following   Acute right  lower extremity DVT -Continue IV heparin, H&H stable  Small retroperitoneal hemorrhage status post biopsy -H&H stable  Acute on chronic hyponatremia -Initially improved with IV fluid and thought to be due to diminished p.o. intake but then patient was volume overloaded -Started on hemodialysis on  1/17, 1/18 and 1/19.  Undergoing HD 1/21 -Sodium improving 127  Small pericardial effusion -Incidentally noted on echocardiogram, without tamponade physiology  Chronic combined systolic and diastolic heart failure, history of cardiac transplant -Patient is status post heart transplant, prior EF 10% - EF has now corrected to 60 to 65% -Volume control with HD, initiated on 1/17  History of cardiac transplant -Continue tacrolimus, to keep goal levels between 5-8 -Continue dexamethasone, reduced dose to 4 mg daily by Dr. Irene Gonzalez on 1/18 and okay to wean down to his baseline prednisone 5 mg daily -Cardiology was consulted, per Dr Glenn Gonzalez, EF is normal, recommended to continue the immunosuppressive regimen and follow the tacrolimus levels. Dr. Haroldine Gonzalez has notified/updated the transplant team at [facility].   Hypothyroidism -Continue Synthroid  Code Status: DNR DVT Prophylaxis: IV heparin Family Communication: Discussed all imaging results, lab results, explained to the patient. Updated patient's wife, Ms. Glenn Gonzalez on phone [phone] on 1/19, called today, went straight to voicemail.  Disposition Plan:     Status is: Inpatient  Remains inpatient appropriate because:Inpatient level of care appropriate due to severity of illness   Dispo: The patient is from: Home              Anticipated d/c is to: Home              Anticipated d/c date is: > 3 days              Patient currently is not medically stable to d/c. Initiated on HD, has fluid overload, multiple electrolyte abnormalities, not stable for discharge.      Time Spent in minutes   35 minutes  Procedures:  HD cath 1.17 IR guided biopsy 1/06  Consultants:   Nephrology Oncology Palliative IR  Antimicrobials:   Anti-infectives (From admission, onward)   Start     Dose/Rate Route Frequency Ordered Stop   12/17/20 0830  vancomycin (VANCOCIN) IVPB 1000 mg/200 mL premix        1,000 mg 200 mL/hr over 60 Minutes  Intravenous To Radiology 12/17/20 0744 12/17/20 0932   12/09/2020 2200  cefdinir (OMNICEF) capsule 300 mg        300 mg Oral 2 times daily 12/04/2020 1716 12/10/20 2159         Medications  Scheduled Meds: . allopurinol  200 mg Oral Daily  . artificial tears  1 application Both Eyes QHS  . azelastine  1 spray Each Nare Daily   And  . fluticasone  1 spray Each Nare Daily  . cetirizine  10 mg Oral QHS  . Chlorhexidine Gluconate Cloth  6 each Topical Daily  . Chlorhexidine Gluconate Cloth  6 each Topical Q0600  . dexamethasone  4 mg Oral Q breakfast  . enfortumab vedotin-ejfv (PADCEV) CHEMO IV infusion  120 mg Intravenous Once  . fentaNYL  1 patch Transdermal Q72H  . heparin sodium (porcine)      . lactulose  10 g Oral BID  . levothyroxine  125 mcg Oral Daily  . metoCLOPramide  5 mg Oral TID AC & HS  . midodrine      . palonosetron  0.25 mg Intravenous Once  . polyvinyl alcohol  1 drop Both Eyes TID  .  senna-docusate  3 tablet Oral BID  . Tacrolimus ER  1.5 mg Oral Daily  . traZODone  100 mg Oral QHS   Continuous Infusions: . sodium chloride    . dexamethasone (DECADRON) IVPB ([facility])    . heparin 1,550 Units/hr (12/21/20 0556)   PRN Meds:.acetaminophen **OR** acetaminophen, bisacodyl, HYDROmorphone (DILAUDID) injection, magnesium hydroxide, midodrine, ondansetron **OR** ondansetron (ZOFRAN) IV, pantoprazole, prochlorperazine      Subjective:   Lorn Butcher was seen and examined today.  No acute issues today or overnight.  No fever chills or acute urinary retention.  Seen during hemodialysis, tolerating well.  Chemo planned for today.     Objective:   Vitals:   12/21/20 1036 12/21/20 1048 12/21/20 1100 12/21/20 1135  BP:  115/72  106/67  Pulse: 88 89 88 89  Resp: 15 (!) _0 Temp:   98 F (36.7 C) 98 F (36.7 C)  TempSrc:   Oral   SpO2: 98% 97% 96% 97%  Weight:      Height:        Intake/Output Summary (Last 24 hours) at 12/21/2020 1253 Last data filed  at 12/21/2020 1240 Gross per 24 hour  Intake 372 ml  Output 1357 ml  Net -985 ml     Wt Readings from Last 3 Encounters:  12/20/20 103.2 kg  11/29/20 100.2 kg  08/31/20 101.2 kg   Physical Exam  General: Alert and oriented x 3, NAD  Cardiovascular: S1 S2 clear, RRR.  2+ pedal edema bilaterally  Respiratory: CTAB, TDC in right upper chest wall  Gastrointestinal: Soft, nontender, nondistended, NBS  Ext: 2+ pedal edema bilaterally  Neuro: no new deficits  Musculoskeletal: No cyanosis, clubbing  Skin: No rashes  Psych: Normal affect and demeanor, alert and oriented x3       Data Reviewed:  I have personally reviewed following labs and imaging studies  Micro Results No results found for this or any previous visit (from the past 240 hour(s)).  Radiology Reports CT ABDOMEN PELVIS WO CONTRAST  Result Date: 12/20/2020 CLINICAL DATA:  Cancer of unknown primary in this 60 year old male. EXAM: CT CHEST, ABDOMEN AND PELVIS WITHOUT CONTRAST TECHNIQUE: Multidetector CT imaging of the chest, abdomen and pelvis was performed following the standard protocol without IV contrast. COMPARISON:  CT abdomen and pelvis of the same date. Also with CT of December 30th of 2021 FINDINGS: CT CHEST FINDINGS Cardiovascular: Calcified atheromatous plaque in the thoracic aorta. Heart size is normal following sternotomy for heart transplant by report a [facility] medical center. Three-vessel coronary artery calcification. No pericardial effusion. Calcifications in the LEFT atrium partially seen on previous imaging are of uncertain significance central pulmonary vasculature also with some signs of calcification. Mediastinum/Nodes: Ovoid structure associated with LEFT thyroid 11 mm similar to previous imaging, cervical spine from 2017. Thyroid as described above. Calcifications in the LEFT supraclavicular region may be within venous structures on the LEFT. No axillary lymphadenopathy. Surgical clips in  the RIGHT axilla and soft tissue thickening over the LEFT pectoralis musculature similar grossly compared to remote MRI evaluation 2018 and surgical clips seen on numerous priors in the RIGHT axilla. No mediastinal lymphadenopathy. No gross hilar lymphadenopathy. Lungs/Pleura: Small RIGHT of pleural effusion and basilar airspace disease. Small nodule in the superior aspect of the RIGHT middle lobe approximately 3 mm on image 57 of series 5. Biapical scarring. Small nodule in the LEFT upper lobe 6 mm (image 51, series 5) airways are patent. Musculoskeletal: Evidence ir sternotomy without significant  bony bridging but with sclerotic margins along the sternotomy line similar to studies dating back to August of 2021 with respect to visualized portions on prior abdomen studies no destructive bone finding or acute bone process. CT ABDOMEN PELVIS FINDINGS Hepatobiliary: 1.9 cm lesion in the RIGHT hepatic lobe near the dome of the RIGHT hemi liver does not display attenuation values of simple cyst is unchanged from the study acquired on the same date at another facility. Additionally a 1.4 cm lesion in the RIGHT hepatic lobe on image 23 of series 2 is unchanged. Sludge in the gallbladder. Tiny low-density foci in the LEFT hepatic lobe without change 1 on image 19 of series 2 in the lateral segment measuring 5 mm and another in the medial segment of the LEFT hepatic lobe measuring 12 mm on image 21 of series 2 these areas appears similar dating back to August of 2020, lesions in the RIGHT hepatic lobe are new. Pancreas: Normal pancreatic contour without signs of inflammation. Spleen: Post splenectomy. Adrenals/Urinary Tract: Adrenal glands, normal on the LEFT and obscured on the RIGHT by perinephric and Peri adrenal nodularity. Fullness of the RIGHT renal pelvis with nephroureteral stent in place showing soft tissue density with distension of the renal pelvis and calices similar to the study acquired on the same date.  Nephrolithiasis also unchanged. Perinephric nodularity along the medial upper pole the LEFT kidney measures 6.4 x 2.7 cm which is within 1-2 mm of its previous measurement when measured by this observer on the prior study. Increased in size when compared to the study of November 30, 2020. Renal cortical scarring on the LEFT with nephrolithiasis in the lower pole unchanged from very recent comparison. Urinary bladder with small amount of gas in the urinary bladder with distal aspect of the stent in place, loop coiled in the urinary bladder. Stomach/Bowel: Scattered colonic diverticulosis without acute gastrointestinal process. Normal appendix. Stool and contrast throughout the colon. Vascular/Lymphatic: Calcified atheromatous plaque in the abdominal aorta without aneurysmal dilation. Retroperitoneal adenopathy unchanged from scan earlier the same day, largest lymph nodes behind the inferior vena cava and in the intra-aortocaval groove and in the upper abdomen, largest on image 41 of series 2 measuring 1.9 cm. Smaller lymph nodes along the LEFT periaortic chain. Postoperative changes in the retroperitoneum associated with previous lymphadenectomy No pelvic lymphadenopathy. Reproductive: Prostate with uro lift device deployment bilaterally similar to recent priors. Other: Extensive retroperitoneal fascial thickening/stranding extending into the root of the small bowel mesentery, nodular changes associated with this thickening on the RIGHT outlining the renal fascia this crosses the midline where it is without significant nodularity but with fascial thickening and stranding, also tracking into the pelvis. Small amount of fluid in the pelvis. Musculoskeletal: Spinal degenerative changes. No destructive bone process or acute bone finding. IMPRESSION: 1. Retroperitoneal adenopathy and perinephric nodularity with dilation of the RIGHT renal pelvis and collecting systems, filled with what is suspected to represent neoplasm.  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 Differential considerations given heart transplantation and presumed immunosuppression would include posttransplant lymphoproliferative disorder/lymphoma. 2. Dense material in collecting systems may represent a mixture of tumor and or blood and there is profound dilation of collecting systems of the RIGHT kidney. This is unchanged compared to the recent comparison study. 3. Signs of hepatic involvement with new lesions in the RIGHT hepatic lobe. 4. Ascites with small amount of ascites with very subtle infiltration of fat in the omentum  not mentioned above, for instance on image 50 of series 2 raising the question of peritoneal involvement as well. This area is in the range of 2-3 mm. 5. Small RIGHT pleural effusion and basilar airspace disease. 6. Postoperative changes about the chest likely related to prior heart transplantation with surgical clips in the RIGHT axilla and thickening and calcification along the anterior pectoralis musculature not changed since 2018. 7. Calcification in the LEFT atrium also about the aortic root and pulmonary artery and to a lesser degree in the RIGHT atrium likely reflects changes of vascular anastomotic sites in the setting of heart transplantation. 8. 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 9. Post splenectomy. 10. Aortic atherosclerosis. 11. Small nodule in the LEFT upper lobe 6 mm airways are patent. These results will be called to the ordering clinician or representative by the Radiologist Assistant, and communication documented in the PACS or [employer]. Aortic Atherosclerosis (ICD10-I70.0). Electronically Signed   By: Zetta Bills M.D.   On: 12/19/2020 17:38   DG Chest 1 View  Result Date: 11/29/2020 CLINICAL DATA:  Hypoxia EXAM: CHEST  1 VIEW  COMPARISON:  08/03/2020 FINDINGS: Since the prior examination, there has developed right basilar consolidation, possibly infectious in the appropriate clinical setting. Mild focal infiltrate is also noted within the right apex. Left lung is clear. No pneumothorax or pleural effusion. Median sternotomy has been performed. Cardiac size within normal limits. Multiple healed right rib fractures are noted. Multiple surgical clips are seen within the right axilla. IMPRESSION: Interval development of multifocal pulmonary infiltrate within the right lung, more focal within the right lung base, suspicious for atypical infection in the acute setting. Electronically Signed   By: Fidela Salisbury MD   On: 11/29/2020 22:27   DG Chest 2 View  Result Date: 12/27/2020 CLINICAL DATA:  Bladder cancer.  Follow-up multifocal pneumonia. EXAM: CHEST - 2 VIEW COMPARISON:  11/29/2020 FINDINGS: Prior median sternotomy. Heart is normal size. Patchy right lung airspace disease in the right apex and right lower lung. Minimal left base linear atelectasis or scarring. Overall aeration in the right lung slightly improved. No effusions. IMPRESSION: Patchy right lung airspace disease with slight improvement since prior study. Left base atelectasis or scarring. Electronically Signed   By: Rolm Baptise M.D.   On: 12/26/2020 13:25   DG Abd 1 View  Result Date: 12/20/2020 CLINICAL DATA:  Ureteral stent placement EXAM: ABDOMEN - 1 VIEW COMPARISON:  CT 12/09/2020 FINDINGS: Double-J LEFT ureteral stent in place. No renal or ureteral calculi identified. Normal bowel-gas pattern. IMPRESSION: RIGHT ureteral stent in place Electronically Signed   By: Suzy Bouchard M.D.   On: 12/20/2020 15:30   CT CHEST WO CONTRAST  Result Date: 12/03/2020 CLINICAL DATA:  Cancer of unknown primary in this 60 year old male. EXAM: CT CHEST, ABDOMEN AND PELVIS WITHOUT CONTRAST TECHNIQUE: Multidetector CT imaging of the chest, abdomen and pelvis was performed following  the standard protocol without IV contrast. COMPARISON:  CT abdomen and pelvis of the same date. Also with CT of December 30th of 2021 FINDINGS: CT CHEST FINDINGS Cardiovascular: Calcified atheromatous plaque in the thoracic aorta. Heart size is normal following sternotomy for heart transplant by report a [facility] medical center. Three-vessel coronary artery calcification. No pericardial effusion. Calcifications in the LEFT atrium partially seen on previous imaging are of uncertain significance central pulmonary vasculature also with some signs of calcification. Mediastinum/Nodes: Ovoid structure associated with LEFT thyroid 11  mm similar to previous imaging, cervical spine from 2017. Thyroid as described above. Calcifications in the LEFT supraclavicular region may be within venous structures on the LEFT. No axillary lymphadenopathy. Surgical clips in the RIGHT axilla and soft tissue thickening over the LEFT pectoralis musculature similar grossly compared to remote MRI evaluation 2018 and surgical clips seen on numerous priors in the RIGHT axilla. No mediastinal lymphadenopathy. No gross hilar lymphadenopathy. Lungs/Pleura: Small RIGHT of pleural effusion and basilar airspace disease. Small nodule in the superior aspect of the RIGHT middle lobe approximately 3 mm on image 57 of series 5. Biapical scarring. Small nodule in the LEFT upper lobe 6 mm (image 51, series 5) airways are patent. Musculoskeletal: Evidence ir sternotomy without significant bony bridging but with sclerotic margins along the sternotomy line similar to studies dating back to August of 2021 with respect to visualized portions on prior abdomen studies no destructive bone finding or acute bone process. CT ABDOMEN PELVIS FINDINGS Hepatobiliary: 1.9 cm lesion in the RIGHT hepatic lobe near the dome of the RIGHT hemi liver does not display attenuation values of simple cyst is unchanged from the study acquired on the same date at another facility.  Additionally a 1.4 cm lesion in the RIGHT hepatic lobe on image 23 of series 2 is unchanged. Sludge in the gallbladder. Tiny low-density foci in the LEFT hepatic lobe without change 1 on image 19 of series 2 in the lateral segment measuring 5 mm and another in the medial segment of the LEFT hepatic lobe measuring 12 mm on image 21 of series 2 these areas appears similar dating back to August of 2020, lesions in the RIGHT hepatic lobe are new. Pancreas: Normal pancreatic contour without signs of inflammation. Spleen: Post splenectomy. Adrenals/Urinary Tract: Adrenal glands, normal on the LEFT and obscured on the RIGHT by perinephric and Peri adrenal nodularity. Fullness of the RIGHT renal pelvis with nephroureteral stent in place showing soft tissue density with distension of the renal pelvis and calices similar to the study acquired on the same date. Nephrolithiasis also unchanged. Perinephric nodularity along the medial upper pole the LEFT kidney measures 6.4 x 2.7 cm which is within 1-2 mm of its previous measurement when measured by this observer on the prior study. Increased in size when compared to the study of November 30, 2020. Renal cortical scarring on the LEFT with nephrolithiasis in the lower pole unchanged from very recent comparison. Urinary bladder with small amount of gas in the urinary bladder with distal aspect of the stent in place, loop coiled in the urinary bladder. Stomach/Bowel: Scattered colonic diverticulosis without acute gastrointestinal process. Normal appendix. Stool and contrast throughout the colon. Vascular/Lymphatic: Calcified atheromatous plaque in the abdominal aorta without aneurysmal dilation. Retroperitoneal adenopathy unchanged from scan earlier the same day, largest lymph nodes behind the inferior vena cava and in the intra-aortocaval groove and in the upper abdomen, largest on image 41 of series 2 measuring 1.9 cm. Smaller lymph nodes along the LEFT periaortic chain.  Postoperative changes in the retroperitoneum associated with previous lymphadenectomy No pelvic lymphadenopathy. Reproductive: Prostate with uro lift device deployment bilaterally similar to recent priors. Other: Extensive retroperitoneal fascial thickening/stranding extending into the root of the small bowel mesentery, nodular changes associated with this thickening on the RIGHT outlining the renal fascia this crosses the midline where it is without significant nodularity but with fascial thickening and stranding, also tracking into the pelvis. Small amount of fluid in the pelvis. Musculoskeletal: Spinal degenerative changes. No destructive bone process  or acute bone finding. IMPRESSION: 1. Retroperitoneal adenopathy and perinephric nodularity with dilation of the RIGHT renal pelvis and collecting systems, filled with what is suspected to represent neoplasm. 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 Differential considerations given heart transplantation and presumed immunosuppression would include posttransplant lymphoproliferative disorder/lymphoma. 2. Dense material in collecting systems may represent a mixture of tumor and or blood and there is profound dilation of collecting systems of the RIGHT kidney. This is unchanged compared to the recent comparison study. 3. Signs of hepatic involvement with new lesions in the RIGHT hepatic lobe. 4. Ascites with small amount of ascites with very subtle infiltration of fat in the omentum not mentioned above, for instance on image 50 of series 2 raising the question of peritoneal involvement as well. This area is in the range of 2-3 mm. 5. Small RIGHT pleural effusion and basilar airspace disease. 6. Postoperative changes about the chest likely related to prior heart transplantation with surgical clips in the RIGHT axilla and thickening and calcification along the anterior  pectoralis musculature not changed since 2018. 7. Calcification in the LEFT atrium also about the aortic root and pulmonary artery and to a lesser degree in the RIGHT atrium likely reflects changes of vascular anastomotic sites in the setting of heart transplantation. 8. 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 9. Post splenectomy. 10. Aortic atherosclerosis. 11. Small nodule in the LEFT upper lobe 6 mm airways are patent. These results will be called to the ordering clinician or representative by the Radiologist Assistant, and communication documented in the PACS or [employer]. Aortic Atherosclerosis (ICD10-I70.0). Electronically Signed   By: Zetta Bills M.D.   On: 12/12/2020 17:38   NM Pulmonary Perfusion  Result Date: 12/18/2020 CLINICAL DATA:  Respiratory failure EXAM: NUCLEAR MEDICINE PERFUSION LUNG SCAN TECHNIQUE: Perfusion images were obtained in multiple projections after intravenous injection of radiopharmaceutical. Ventilation scans intentionally deferred if perfusion scan and chest x-ray adequate for interpretation during COVID 19 epidemic. RADIOPHARMACEUTICALS:  4.1 mCi Tc-1mMAA IV COMPARISON:  Chest x-ray 12/17/2020 FINDINGS: Slightly heterogeneous distribution of radiotracer within the lung fields. Multiple small bilateral wedge-shaped perfusion defects. No corresponding radiographic abnormality. No large mismatched segmental perfusion defect. IMPRESSION: Intermediate probability for pulmonary embolism. Electronically Signed   By: NDavina PokeD.O.   On: 12/31/2020 13:30   [place]  Result Date: 12/20/2020 CLINICAL DATA:  Urinary retention. 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 EXAM: RENAL / URINARY TRACT ULTRASOUND COMPLETE COMPARISON:  CT 12/19/2020 FINDINGS: Right Kidney: Renal measurements: 14.4 x 9.2 x 7.9 cm = volume: 547 mL. Normal echogenicity. Mild right hydronephrosis, relatively  similar to 12/13/2020 CT. Left Kidney: Renal measurements: 12.5 x 3.5 x 6.4 cm = volume: 231 mL. Echogenicity within normal limits. No mass or hydronephrosis visualized. Bladder: Echogenicity within the posterior bladder is likely due to the right ureteric stent. Other: Mild exam degradation secondary to patient's tenderness. IMPRESSION: 1. Mild right-sided hydronephrosis, relatively similar to 12/11/2020 CT. 2. No other explanation for urinary retention. Electronically Signed   By: KAbigail MiyamotoM.D.   On: 12/20/2020 11:18   IR Fluoro Guide CV Line Left  Result Date: 12/17/2020 INDICATION: 60year old male referred for hemodialysis catheter placement EXAM: TUNNELED CENTRAL VENOUS HEMODIALYSIS CATHETER PLACEMENT WITH ULTRASOUND AND FLUOROSCOPIC GUIDANCE MEDICATIONS: 1 g vancomycin. The antibiotic was given in an appropriate time interval prior to skin puncture. ANESTHESIA/SEDATION: No  moderate sedation. A total of Versed 0.5 mg and Fentanyl 0 mcg was administered intravenously. Moderate Sedation Time: 0 minutes. The patient's level of consciousness and vital signs were monitored continuously by radiology nursing throughout the procedure under my direct supervision. FLUOROSCOPY TIME:  Fluoroscopy Time: 0 minutes 54 seconds (3 mGy). COMPLICATIONS: None PROCEDURE: Informed written consent was obtained from the patient after a discussion of the risks, benefits, and alternatives to treatment. Questions regarding the procedure were encouraged and answered. The right neck and chest were prepped with chlorhexidine in a sterile fashion, and a sterile drape was applied covering the operative field. Maximum barrier sterile technique with sterile gowns and gloves were used for the procedure. A timeout was performed prior to the initiation of the procedure. Ultrasound survey was performed. Micropuncture kit was utilized to access the right internal jugular vein under direct, real-time ultrasound guidance after the overlying  soft tissues were anesthetized with 1% lidocaine with epinephrine. Stab incision was made with 11 blade scalpel. Microwire was passed centrally. The microwire was then marked to measure appropriate internal catheter length. External tunneled length was estimated. A total tip to cuff length of 19 cm was selected. 035 guidewire was advanced to the level of the IVC. Skin and subcutaneous tissues of chest wall below the clavicle were generously infiltrated with 1% lidocaine for local anesthesia. A small stab incision was made with 11 blade scalpel. The selected hemodialysis catheter was tunneled in a retrograde fashion from the anterior chest wall to the venotomy incision. Serial dilation was performed and then a peel-away sheath was placed. The catheter was then placed through the peel-away sheath with tips ultimately positioned within the superior aspect of the right atrium. Final catheter positioning was confirmed and documented with a spot radiographic image. The catheter aspirates and flushes normally. The catheter was flushed with appropriate volume heparin dwells. The catheter exit site was secured with a 0-Prolene retention suture. Gel-Foam slurry was infused into the soft tissue tract. The venotomy incision was closed Derma bond and sterile dressing. Dressings were applied at the chest wall. Patient tolerated the procedure well and remained hemodynamically stable throughout. No complications were encountered and no significant blood loss encountered. IMPRESSION: Status post right IJ tunneled hemodialysis catheter placement. Catheter ready for use. Signed, Dulcy Fanny. Dellia Nims, RPVI [facility] Radiology Electronically Signed   By: Corrie Mckusick D.O.   On: 12/17/2020 15:26   IR US Guide Vasc Access Left  Result Date: 12/17/2020 INDICATION: 60 year old male referred for hemodialysis catheter placement EXAM: TUNNELED CENTRAL VENOUS HEMODIALYSIS CATHETER PLACEMENT  WITH ULTRASOUND AND FLUOROSCOPIC GUIDANCE MEDICATIONS: 1 g vancomycin. The antibiotic was given in an appropriate time interval prior to skin puncture. ANESTHESIA/SEDATION: No moderate sedation. A total of Versed 0.5 mg and Fentanyl 0 mcg was administered intravenously. Moderate Sedation Time: 0 minutes. The patient's level of consciousness and vital signs were monitored continuously by radiology nursing throughout the procedure under my direct supervision. FLUOROSCOPY TIME:  Fluoroscopy Time: 0 minutes 54 seconds (3 mGy). COMPLICATIONS: None PROCEDURE: Informed written consent was obtained from the patient after a discussion of the risks, benefits, and alternatives to treatment. Questions regarding the procedure were encouraged and answered. The right neck and chest were prepped with chlorhexidine in a sterile fashion, and a sterile drape was applied covering the operative field. Maximum barrier sterile technique with sterile gowns and gloves were used for the procedure. A timeout was performed prior to the initiation of the procedure. Ultrasound survey  was performed. Micropuncture kit was utilized to access the right internal jugular vein under direct, real-time ultrasound guidance after the overlying soft tissues were anesthetized with 1% lidocaine with epinephrine. Stab incision was made with 11 blade scalpel. Microwire was passed centrally. The microwire was then marked to measure appropriate internal catheter length. External tunneled length was estimated. A total tip to cuff length of 19 cm was selected. 035 guidewire was advanced to the level of the IVC. Skin and subcutaneous tissues of chest wall below the clavicle were generously infiltrated with 1% lidocaine for local anesthesia. A small stab incision was made with 11 blade scalpel. The selected hemodialysis catheter was tunneled in a retrograde fashion from the anterior chest wall to the venotomy incision. Serial dilation was performed and then a  peel-away sheath was placed. The catheter was then placed through the peel-away sheath with tips ultimately positioned within the superior aspect of the right atrium. Final catheter positioning was confirmed and documented with a spot radiographic image. The catheter aspirates and flushes normally. The catheter was flushed with appropriate volume heparin dwells. The catheter exit site was secured with a 0-Prolene retention suture. Gel-Foam slurry was infused into the soft tissue tract. The venotomy incision was closed Derma bond and sterile dressing. Dressings were applied at the chest wall. Patient tolerated the procedure well and remained hemodynamically stable throughout. No complications were encountered and no significant blood loss encountered. IMPRESSION: Status post right IJ tunneled hemodialysis catheter placement. Catheter ready for use. Signed, Dulcy Fanny. Dellia Nims, RPVI [facility] Radiology Electronically Signed   By: Corrie Mckusick D.O.   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 ANESTHESIA/SEDATION: Fentanyl 100 mcg IV; Versed 4 mg IV Sedation time: 14 minutes; The patient was continuously monitored during the procedure by the interventional radiology nurse under my direct supervision. CONTRAST:  None. COMPLICATIONS: SIR Level A - No therapy, no consequence. Retroperitoneal hemorrhage at site of biopsy, controlled with Gel-Foam slurry administration along needle track. PROCEDURE: Informed consent was obtained from the patient following an explanation of the procedure, risks, benefits and alternatives. A time out was performed prior to the initiation of the procedure. The patient was positioned in a partial left lateral decubitus  position on the CT table and a limited CT was performed for procedural planning demonstrating similar appearing multifocal right retroperitoneal lymphadenopathy. The procedure was planned. The operative site was prepped and draped in the usual sterile fashion. Appropriate trajectory was confirmed with a 22 gauge spinal needle after the adjacent tissues were anesthetized with 1% Lidocaine with epinephrine. Under intermittent CT guidance, a 17 gauge coaxial needle was advanced into the peripheral aspect of the mass. Appropriate positioning was confirmed and a total of 4 samples were obtained with an 18 gauge core needle biopsy device. A limited CT demonstrated focal hemorrhage about the biopsied lymph node. Gel-Foam slurry was injected through the introducer needle along the needle track. The co-axial needle was removed and hemostasis was achieved with manual compression. Additional limited postprocedural CT was negative for expanding hemorrhage or additional complication. A dressing was placed. The patient tolerated the procedure well without immediate postprocedural complication. IMPRESSION: Technically successful CT guided core needle biopsy of right retroperitoneal lymph node. Ruthann Cancer, MD [facility]iology Special[facility] Radiology Electronically Signed   By: Camillia Herter  Suttle MD   On: 12/07/2020 08:53   DG CHEST PORT 1 VIEW  Result Date: 12/10/2020 CLINICAL DATA:  Hypoxia.  History of cardiac transplant EXAM: PORTABLE CHEST 1 VIEW COMPARISON:  12/28/2020 FINDINGS: Prior median sternotomy. Heart size within normal limits. Atherosclerotic calcification of the aortic knob. Subtle right lower lobe opacity, slightly improved from prior. Trace right pleural effusion. No pneumothorax. IMPRESSION: 1. Subtle right lower lobe opacity, slightly improved from prior. 2. Trace right pleural effusion. Electronically Signed   By: Davina Poke D.O.   On: 12/10/2020 10:33   DG C-Arm 1-60  Min-No Report  Result Date: 11/29/2020 Fluoroscopy was utilized by the requesting physician.  No radiographic interpretation.   ECHOCARDIOGRAM COMPLETE  Result Date: 11/30/2020    ECHOCARDIOGRAM REPORT   Patient Name:   LAW CORSINO Peckman Date of Exam: 11/30/2020 Medical Rec #:  [id number]         Height:       74.0 in Accession #:    [id number]        Weight:       221.0 lb Date of Birth:  Nov 02, 1961        BSA:          2.268 m Patient Age:    19 years          BP:           103/59 mmHg Patient Gender: M                 HR:           94 bpm. Exam Location:  Inpatient Procedure: 2D Echo, Cardiac Doppler, Color Doppler and Intracardiac            Opacification Agent Indications:    Dyspnea R06.00  History:        Patient has prior history of Echocardiogram examinations, most                 recent 05/12/2017. Arrythmias:Atrial Fibrillation; Risk                 Factors:Non-Smoker. GERD. 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  Sonographer:    Vickie Epley RDCS Referring Phys: [id number] [facility]  Sonographer Comments: Suboptimal apical window and suboptimal subcostal window. Pt unable to turn on side due to herniated discs. IMPRESSIONS  1. Left ventricular ejection fraction, by estimation, is 60 to 65%. The left ventricle has normal function. The left ventricle has no regional wall motion abnormalities. Left ventricular diastolic parameters were normal.  2. Right ventricular systolic function is normal. The right ventricular size is normal. There is normal pulmonary artery systolic pressure. The estimated right ventricular systolic pressure is 68.1 mmHg.  3. The mitral valve is normal in structure. No evidence of mitral valve regurgitation. No evidence of mitral stenosis.  4. The aortic valve is normal in structure. Aortic valve regurgitation is not visualized. No aortic stenosis is present.  5. The inferior vena cava is normal in size with greater than 50% respiratory variability, suggesting right atrial pressure  of 3 mmHg. FINDINGS  Left Ventricle: Left ventricular ejection fraction, by estimation, is 60 to 65%. The left ventricle has normal function. The left ventricle has no regional wall motion abnormalities. Definity contrast agent was given IV to delineate the left ventricular  endocardial borders. The left ventricular internal cavity size was normal in size. There is no left ventricular hypertrophy. Left ventricular diastolic parameters were normal. Normal left ventricular filling pressure. Right Ventricle: The right  ventricular size is normal. No increase in right ventricular wall thickness. Right ventricular systolic function is normal. There is normal pulmonary artery systolic pressure. The tricuspid regurgitant velocity is 2.39 m/s, and  with an assumed right atrial pressure of 3 mmHg, the estimated right ventricular systolic pressure is 75.4 mmHg. Left Atrium: Left atrial size was normal in size. Right Atrium: Right atrial size was normal in size. Pericardium: There is no evidence of pericardial effusion. Mitral Valve: The mitral valve is normal in structure. No evidence of mitral valve regurgitation. No evidence of mitral valve stenosis. Tricuspid Valve: The tricuspid valve is normal in structure. Tricuspid valve regurgitation is mild . No evidence of tricuspid stenosis. Aortic Valve: The aortic valve is normal in structure. Aortic valve regurgitation is not visualized. No aortic stenosis is present. Pulmonic Valve: The pulmonic valve was normal in structure. Pulmonic valve regurgitation is not visualized. No evidence of pulmonic stenosis. Aorta: The aortic root is normal in size and structure. Venous: The inferior vena cava is normal in size with greater than 50% respiratory variability, suggesting right atrial pressure of 3 mmHg. IAS/Shunts: No atrial level shunt detected by color flow Doppler.  LEFT VENTRICLE PLAX 2D LVIDd:         4.40 cm     Diastology LVIDs:         3.20 cm     LV e' medial:    10.00 cm/s  LV PW:         0.90 cm     LV E/e' medial:  7.5 LV IVS:        0.90 cm     LV e' lateral:   11.30 cm/s LVOT diam:     2.50 cm     LV E/e' lateral: 6.7 LV SV:         48 LV SV Index:   21 LVOT Area:     4.91 cm  LV Volumes (MOD) LV vol d, MOD A4C: 80.2 ml LV vol s, MOD A4C: 30.4 ml LV SV MOD A4C:     80.2 ml LEFT ATRIUM           Index LA diam:      2.70 cm 1.19 cm/m LA Vol (A4C): 16.3 ml 7.19 ml/m  AORTIC VALVE LVOT Vmax:   50.20 cm/s LVOT Vmean:  36.700 cm/s LVOT VTI:    0.099 m  AORTA Ao Root diam: 3.40 cm MITRAL VALVE               TRICUSPID VALVE MV Area (PHT): 5.38 cm    TR Peak grad:   22.8 mmHg MV Decel Time: 141 msec    TR Vmax:        239.00 cm/s MV E velocity: 75.40 cm/s MV A velocity: 36.00 cm/s  SHUNTS MV E/A ratio:  2.09        Systemic VTI:  0.10 m                            Systemic Diam: 2.50 cm Dani Gobble Croitoru MD Electronically signed by Sanda Klein MD Signature Date/Time: 11/30/2020/12:30:40 PM    Final    VAS [place] LOWER EXTREMITY VENOUS (DVT)  Result Date: 12/10/2020  Lower Venous DVT Study Indications: Pain.  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 Comparison Study: Previous 12/02/20 Negative Performing Technologist: Vonzell Schlatter RVT  Examination Guidelines: A complete evaluation includes B-mode imaging, spectral Doppler,  color Doppler, and power Doppler as needed of all accessible portions of each vessel. Bilateral testing is considered an integral part of a complete examination. Limited examinations for reoccurring indications may be performed as noted. The reflux portion of the exam is performed with the patient in reverse Trendelenburg.  +---------+---------------+---------+-----------+----------+--------------+ RIGHT    CompressibilityPhasicitySpontaneityPropertiesThrombus Aging +---------+---------------+---------+-----------+----------+--------------+ CFV      Full           Yes      Yes                                  +---------+---------------+---------+-----------+----------+--------------+ SFJ      Full                                                        +---------+---------------+---------+-----------+----------+--------------+ FV Prox  Full                                                        +---------+---------------+---------+-----------+----------+--------------+ FV Mid   Full                                                        +---------+---------------+---------+-----------+----------+--------------+ FV DistalFull                                                        +---------+---------------+---------+-----------+----------+--------------+ PFV      Full                                                        +---------+---------------+---------+-----------+----------+--------------+ POP      Full           Yes      Yes                                 +---------+---------------+---------+-----------+----------+--------------+ PTV      None                                                        +---------+---------------+---------+-----------+----------+--------------+ PERO     None                                                        +---------+---------------+---------+-----------+----------+--------------+   +---------+---------------+---------+-----------+----------+--------------+  LEFT     CompressibilityPhasicitySpontaneityPropertiesThrombus Aging +---------+---------------+---------+-----------+----------+--------------+ CFV      Full           Yes      Yes                                 +---------+---------------+---------+-----------+----------+--------------+ SFJ      Full                                                        +---------+---------------+---------+-----------+----------+--------------+ FV Prox  Full                                                         +---------+---------------+---------+-----------+----------+--------------+ FV Mid   Full                                                        +---------+---------------+---------+-----------+----------+--------------+ FV DistalFull                                                        +---------+---------------+---------+-----------+----------+--------------+ PFV      Full                                                        +---------+---------------+---------+-----------+----------+--------------+ POP      Full           Yes      Yes                                 +---------+---------------+---------+-----------+----------+--------------+ PTV      Full                                                        +---------+---------------+---------+-----------+----------+--------------+ PERO     Full                                                        +---------+---------------+---------+-----------+----------+--------------+     Summary: RIGHT: - Findings consistent with acute deep vein thrombosis involving one right posterior tibial vein, and both right peroneal veins. - No cystic structure found in the popliteal fossa.  LEFT: - There is no evidence of deep vein thrombosis in the  lower extremity.  - No cystic structure found in the popliteal fossa.  *See table(s) above for measurements and observations. Electronically signed by Jamelle Haring on 12/10/2020 at 7:16:00 PM.    Final    VAS [place] LOWER EXTREMITY VENOUS (DVT)  Result Date: 12/03/2020  Lower Venous DVT Study Indications: Pain.  Comparison Study: Previous 12/2019 Performing Technologist: Vonzell Schlatter RVT  Examination Guidelines: A complete evaluation includes B-mode imaging, spectral Doppler, color Doppler, and power Doppler as needed of all accessible portions of each vessel. Bilateral testing is considered an integral part of a complete examination. Limited examinations for reoccurring indications may be  performed as noted. The reflux portion of the exam is performed with the patient in reverse Trendelenburg.  +---------+---------------+---------+-----------+----------+--------------+ RIGHT    CompressibilityPhasicitySpontaneityPropertiesThrombus Aging +---------+---------------+---------+-----------+----------+--------------+ CFV      Full           Yes      Yes                                 +---------+---------------+---------+-----------+----------+--------------+ SFJ      Full                                                        +---------+---------------+---------+-----------+----------+--------------+ FV Prox  Full                                                        +---------+---------------+---------+-----------+----------+--------------+ FV Mid   Full                                                        +---------+---------------+---------+-----------+----------+--------------+ FV DistalFull                                                        +---------+---------------+---------+-----------+----------+--------------+ PFV      Full                                                        +---------+---------------+---------+-----------+----------+--------------+ POP      Full           Yes      Yes                                 +---------+---------------+---------+-----------+----------+--------------+ PTV      Full                                                        +---------+---------------+---------+-----------+----------+--------------+  PERO     Full                                                        +---------+---------------+---------+-----------+----------+--------------+   +---------+---------------+---------+-----------+----------+--------------+ LEFT     CompressibilityPhasicitySpontaneityPropertiesThrombus Aging +---------+---------------+---------+-----------+----------+--------------+ CFV      Full            Yes      Yes                                 +---------+---------------+---------+-----------+----------+--------------+ SFJ      Full                                                        +---------+---------------+---------+-----------+----------+--------------+ FV Prox  Full                                                        +---------+---------------+---------+-----------+----------+--------------+ FV Mid   Full                                                        +---------+---------------+---------+-----------+----------+--------------+ FV DistalFull                                                        +---------+---------------+---------+-----------+----------+--------------+ PFV      Full                                                        +---------+---------------+---------+-----------+----------+--------------+ POP      Full           Yes      Yes                                 +---------+---------------+---------+-----------+----------+--------------+ PTV      Full                                                        +---------+---------------+---------+-----------+----------+--------------+ PERO     Full                                                        +---------+---------------+---------+-----------+----------+--------------+  Summary: RIGHT: - There is no evidence of deep vein thrombosis in the lower extremity.  - No cystic structure found in the popliteal fossa.  LEFT: - There is no evidence of deep vein thrombosis in the lower extremity.  - No cystic structure found in the popliteal fossa.  *See table(s) above for measurements and observations. Electronically signed by Ruta Hinds MD on 12/03/2020 at 12:15:41 PM.    Final    ECHOCARDIOGRAM LIMITED  Result Date: 12/10/2020    ECHOCARDIOGRAM LIMITED REPORT   Patient Name:   ANSELM AUMILLER Casimir Date of Exam: 12/10/2020 Medical Rec #:  [id number]          Height:       74.0 in Accession #:    [id number]        Weight:       230.4 lb Date of Birth:  1961/11/28        BSA:          2.308 m Patient Age:    66 years          BP:           105/72 mmHg Patient Gender: M                 HR:           99 bpm. Exam Location:  Inpatient Procedure: Limited Echo, Cardiac Doppler and Color Doppler Indications:    Acute DVT  History:        Patient has prior history of Echocardiogram examinations, most                 recent 11/30/2020. Arrythmias:Atrial Fibrillation. CKD. GERD.                 S/P heart transplant 01/22/16.  Sonographer:    Clayton Lefort RDCS (AE) Referring Phys: [id number] [facility] D NETTEY  Sonographer Comments: Technically difficult study due to poor echo windows, suboptimal apical window and suboptimal subcostal window. Unable to roll patient due to herniated discs. IMPRESSIONS  1. S/P Heart Transplant. Left ventricular ejection fraction, by estimation, is 60 to 65%. The left ventricle has normal function. Left ventricular endocardial border not optimally defined to evaluate regional wall motion. There is moderate concentric left ventricular hypertrophy. Left ventricular diastolic function could not be evaluated.  2. Right ventricular systolic function is moderately reduced. The right ventricular size is mildly enlarged. Tricuspid regurgitation signal is inadequate for assessing PA pressure.  3. A small pericardial effusion is present. The pericardial effusion is surrounding the apex.  4. Tricuspid valve regurgitation is mild to moderate. FINDINGS  Left Ventricle: S/P Heart Transplant. Left ventricular ejection fraction, by estimation, is 60 to 65%. The left ventricle has normal function. Left ventricular endocardial border not optimally defined to evaluate regional wall motion. There is moderate concentric left ventricular hypertrophy. Left ventricular diastolic function could not be evaluated. Right Ventricle: The right ventricular size is mildly enlarged. Right  ventricular systolic function is moderately reduced. Tricuspid regurgitation signal is inadequate for assessing PA pressure. Pericardium: A small pericardial effusion is present. The pericardial effusion is surrounding the apex. Tricuspid Valve: Tricuspid valve regurgitation is mild to moderate. Venous: The inferior vena cava was not well visualized. LEFT VENTRICLE PLAX 2D LVIDd:         3.60 cm LVIDs:         2.30 cm LV PW:         1.40 cm LV IVS:  1.50 cm LVOT diam:     2.50 cm LVOT Area:     4.91 cm  LEFT ATRIUM         Index LA diam:    2.80 cm 1.21 cm/m   AORTA Ao Root diam: 3.70 cm Ao Asc diam:  3.10 cm TRICUSPID VALVE TR Peak grad:   52.4 mmHg TR Vmax:        362.00 cm/s  SHUNTS Systemic Diam: 2.50 cm Fransico Him MD Electronically signed by Fransico Him MD Signature Date/Time: 12/10/2020/3:35:06 PM    Final     Lab Data:  CBC: Recent Labs  Lab 12/17/20 0319 12/18/20 0409 12/19/20 0720 12/20/20 0527 12/21/20 0357  WBC 22.1* 24.1* 26.8* 25.7* 25.4*  NEUTROABS  --   --   --   --  22.3*  HGB 14.5 13.9 13.3 12.4* 13.2  HCT 43.8 41.8 38.1* 37.6* 40.3  MCV 94.0 93.9 93.2 94.5 95.5  PLT 443* 360 366 328 322   Basic Metabolic Panel: Recent Labs  Lab 12/17/20 0319 12/18/20 0409 12/19/20 0720 12/20/20 0527 12/21/20 0357  NA 122* 125* 125* 128* 127*  K 5.9* 5.2* 5.4* 4.8 5.0  CL 87* 89* 90* 93* 92*  CO2 18* 21* 20* 21* 21*  GLUCOSE 122* 105* 118* 127* 110*  BUN 120* 100* 117* 84* 104*  CREATININE 5.26* 4.56* 4.78* 4.04* 4.42*  CALCIUM 8.6* 8.6* 8.5* 8.4* 8.6*   GFR: Estimated Creatinine Clearance: 23.1 mL/min (A) (by C-G formula based on SCr of 4.42 mg/dL (H)). Liver Function Tests: Recent Labs  Lab 12/21/20 0357  AST 37  ALT 13  ALKPHOS 49  BILITOT 0.6  PROT 5.7*  ALBUMIN 2.9*   No results for input(s): LIPASE, AMYLASE in the last 168 hours. No results for input(s): AMMONIA in the last 168 hours. Coagulation Profile: No results for input(s): INR, PROTIME in  the last 168 hours. Cardiac Enzymes: No results for input(s): CKTOTAL, CKMB, CKMBINDEX, TROPONINI in the last 168 hours. BNP (last 3 results) No results for input(s): PROBNP in the last 8760 hours. HbA1C: No results for input(s): HGBA1C in the last 72 hours. CBG: No results for input(s): GLUCAP in the last 168 hours. Lipid Profile: No results for input(s): CHOL, HDL, LDLCALC, TRIG, CHOLHDL, LDLDIRECT in the last 72 hours. Thyroid Function Tests: No results for input(s): TSH, T4TOTAL, FREET4, T3FREE, THYROIDAB in the last 72 hours. Anemia Panel: No results for input(s): VITAMINB12, FOLATE, FERRITIN, TIBC, IRON, RETICCTPCT in the last 72 hours. Urine analysis:    Component Value Date/Time   COLORURINE YELLOW 12/15/2020 1557   APPEARANCEUR CLOUDY (A) 12/15/2020 1557   LABSPEC 1.011 12/15/2020 1557   PHURINE 5.0 12/15/2020 1557   GLUCOSEU NEGATIVE 12/15/2020 1557   HGBUR LARGE (A) 12/15/2020 1557   HGBUR negative 05/12/2008 0957   BILIRUBINUR NEGATIVE 12/15/2020 [place] 12/15/2020 1557   PROTEINUR 30 (A) 12/15/2020 1557   UROBILINOGEN 0.2 07/31/2015 2311   NITRITE NEGATIVE 12/15/2020 1557   LEUKOCYTESUR TRACE (A) 12/15/2020 1557     Alexiah Koroma M.D. [facility] 12/21/2020, 12:53 PM   Call night coverage person covering after 7pm

## 2020-12-21 NOTE — Progress Notes (Signed)
ANTICOAGULATION CONSULT NOTE - Follow Up Consult  Pharmacy Consult for IV Heparin Indication: DVT (RLE)  Allergies  Allergen Reactions  . Digoxin Other (See Comments)    gynecomastia   . Phencyclidine Other (See Comments)    PCP derived antibiotic > Insomnia  . Spironolactone Other (See Comments)    Gynecomastia  . Oxybutynin Chloride     Other reaction(s): low BP  . Elemental Sulfur Rash  . Lactose Intolerance (Gi) Diarrhea and Other (See Comments)    cramps  . Penicillins Other (See Comments)    Cramps in stomach Did it involve swelling of the face/tongue/throat, SOB, or low BP? No Did it involve sudden or severe rash/hives, skin peeling, or any reaction on the inside of your mouth or nose? No Did you need to seek medical attention at a hospital or doctor's office? No When did it last happen?unknown If all above answers are "NO", may proceed with cephalosporin use.       Patient Measurements: Height: 6\' 2"  (188 cm) Weight: 103.2 kg (227 lb 8.3 oz) IBW/kg (Calculated) : 82.2 Heparin Dosing Weight: 104 kg  Vital Signs: Temp: 98 F (36.7 C) (01/21 1135) Temp Source: Oral (01/21 1100) BP: 106/67 (01/21 1135) Pulse Rate: 89 (01/21 1135)  Labs: Recent Labs    12/19/20 0720 12/19/20 1507 12/20/20 0527 12/20/20 1555 12/21/20 0357 12/21/20 1418  HGB 13.3  --  12.4*  --  13.2  --   HCT 38.1*  --  37.6*  --  40.3  --   PLT 366  --  328  --  355  --   HEPARINUNFRC 0.45   < > 0.24* 0.69 0.29* 0.35  CREATININE 4.78*  --  4.04*  --  4.42*  --    < > = values in this interval not displayed.    Estimated Creatinine Clearance: 23.1 mL/min (A) (by C-G formula based on SCr of 4.42 mg/dL (H)).   Assessment: 60 yr old man with medical hx significant for Hodgkin lymphoma, cardiac transplant (on tacrolimus/prednisone), combined systolic/diastolic HF, high-grade T1 bladder cancer with recent hospitalization for AKI complicated by hydronephrosis in setting of new  retroperitoneal lymphadenopathy continues on heparin for acute RLE DVT  - 1/6: IR guided right retroperitoneal lymph node biopsy Retroperitoneal hemorrhage noted on post-biopsy CT - 1/10: LE doppler- acute DVT   Heparin level is 0.35, therapeutic on 1550 units/hr. Hgb 13.2 stable,  pltc within normal, stable. No bleeding reported    Goal of Therapy:  Heparin level 0.3-0.7 units/ml Monitor platelets by anticoagulation protocol: Yes   Plan:   Continue heparin infusion  1550 units/hr Daily HL, CBC  Nicole Cella, RPh Clinical Pharmacist Please check AMION for all [place] phone numbers After 10:00 PM, call [facility] [phone]

## 2020-12-21 NOTE — Progress Notes (Signed)
Spoke with HD secretary and informed that patient needs to be first thing this am due to having Chemotherapy after. Called Zandra Abts NP and informed that secretary will tell charge RN to tell place early. Arthor Captain LPN

## 2020-12-21 NOTE — Plan of Care (Signed)
°  Problem: Health Behavior/Discharge Planning: Goal: Ability to manage health-related needs will improve Outcome: Progressing   Problem: Coping: Goal: Level of anxiety will decrease Outcome: Progressing   Problem: Pain Managment: Goal: General experience of comfort will improve Outcome: Progressing   Problem: Education: Goal: Knowledge of the prescribed therapeutic regimen will improve Outcome: Progressing

## 2020-12-21 NOTE — Progress Notes (Signed)
ANTICOAGULATION CONSULT NOTE - Follow Up Consult  Pharmacy Consult for IV Heparin Indication: DVT  Allergies  Allergen Reactions   Digoxin Other (See Comments)    gynecomastia    Phencyclidine Other (See Comments)    PCP derived antibiotic > Insomnia   Spironolactone Other (See Comments)    Gynecomastia   Oxybutynin Chloride     Other reaction(s): low BP   Elemental Sulfur Rash   Lactose Intolerance (Gi) Diarrhea and Other (See Comments)    cramps   Penicillins Other (See Comments)    Cramps in stomach Did it involve swelling of the face/tongue/throat, SOB, or low BP? No Did it involve sudden or severe rash/hives, skin peeling, or any reaction on the inside of your mouth or nose? No Did you need to seek medical attention at a hospital or doctor's office? No When did it last happen?unknown If all above answers are "NO", may proceed with cephalosporin use.       Patient Measurements: Height: 6\' 2"  (188 cm) Weight: 103.2 kg (227 lb 8.3 oz) IBW/kg (Calculated) : 82.2 Heparin Dosing Weight: 104 kg  Vital Signs: Temp: 98.5 F (36.9 C) (01/21 0453) BP: 106/70 (01/21 0453) Pulse Rate: 86 (01/21 0453)  Labs: Recent Labs    12/19/20 0720 12/19/20 1507 12/20/20 0527 12/20/20 1555 12/21/20 0357  HGB 13.3  --  12.4*  --  13.2  HCT 38.1*  --  37.6*  --  40.3  PLT 366  --  328  --  355  HEPARINUNFRC 0.45   < > 0.24* 0.69 0.29*  CREATININE 4.78*  --  4.04*  --  4.42*   < > = values in this interval not displayed.    Estimated Creatinine Clearance: 23.1 mL/min (A) (by C-G formula based on SCr of 4.42 mg/dL (H)).   Assessment: 60 yr old man with medical hx significant for Hodgkin lymphoma, cardiac transplant (on tacrolimus/prednisone), combined systolic/diastolic HF, high-grade T1 bladder cancer with recent hospitalization for AKI complicated by hydronephrosis in setting of new retroperitoneal lymphadenopathy continues on heparin for acute RLE DVT  - 1/6: IR  guided right retroperitoneal lymph node biopsy Retroperitoneal hemorrhage noted on post-biopsy CT - 1/10: LE doppler- acute DVT   Heparin level now down to slightly subtherapeutic (0.29) on gtt at 1500 units/hr.  Goal of Therapy:  Heparin level 0.3-0.7 units/ml Monitor platelets by anticoagulation protocol: Yes   Plan:   Increase heparin infusion slightly to 1550 units/hr Will f/u 8 hr heparin level  Sherlon Handing, PharmD, BCPS Please see amion for complete clinical pharmacist phone list 12/21/2020 5:44 AM

## 2020-12-21 NOTE — Progress Notes (Signed)
[facility] Progress Note     Assessment/ Plan:   1. Dialysis Dependent AKI on CKD 3a - b/l creat 1.2- 1.4 from oct 2021.   Renal function worsening over last few weeks. UA +wbc's and rbc's, no sig proteinuria. Imaging shows stable R ureteral stent and stable R collecting system dilatation. Has new significant leg edema. ECHO showed normal EF.   Creat 1.8 on admission > up to 2.3 then 2.8 after lasix tried, then 3.1 >  3.2 > 3.4 > up to 4.5 -- HD.  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   IR placed [facility] 1/17 R IJ, HD #1 1/17  HD #3 1/21: 3hr, intra-dialytic hypotension therefore order midorine 10 mg and albumin 25 g. UF as tolerated.  2. Hyperkalemia - Improved, Stop lokelma 3. Hyponatremia -  Inability to excrete free water 2/2 low GFR. Fluid restriction, will correct with HD. 4. High-grade bladder cancer- recent dx , treated w/ BCG in Aug 2021, sp TURBT x 2 Aug and Sept. New RP adenopathy w/ R hydro in Dec 2021 > cysto and R ureteral stent per urology. Biopsy RP mass done last week showed poorly diff urothelial carcinoma, molecular markers pending. Onc consulting w/ [facility].  Has started CTX with Dr. Irene Limbo 5. Volume excess - increase UF as able 6. H/o Hodgkin's lymphoma - remote 7. H/o cardiac transplant - taking prograf 1.5 mg /d and pred 5 /d per CV 8. Acute RLE DVT - per primary team. Positive for superficial venous thrombosis/thrombophlebitis of the mid right leg greater saphenous vein but neg for DVT. 9. Chronic diast CHF - had prior LVEF 10% but now has corrected to 60-65%   Subjective:     Tolerated HD #3 today, 1.5-2 L UF but limited by BP drop, midodrine and albumin IV.   No complaints this morning   Objective:   BP (!) 112/58    Pulse 91    Temp 97.8 F (36.6 C) (Oral)    Resp (!) 21    Ht 6\' 2"  (1.88 m)    Wt 103.2 kg    SpO2 96%    BMI 29.21 kg/m   Intake/Output Summary (Last 24  hours) at 12/21/2020 0926 Last data filed at 12/21/2020 0200 Gross per 24 hour  Intake 252 ml  Output 350 ml  Net -98 ml   Weight change: -0.998 kg  Physical Exam: Genalert, NAD No jvd or bruits Chest clear bilatto bases RRR no MRG Abdprotuberant, no ascites by exam, no hsm, +BS Ext3+ pitting bilat LE from feet to hips to abd/chest wall Neuro is alert, Ox 3 , nf TDC site clean.   Imaging: DG Abd 1 View  Result Date: 12/20/2020 CLINICAL DATA:  Ureteral stent placement EXAM: ABDOMEN - 1 VIEW COMPARISON:  CT 12/17/2020 FINDINGS: Double-J LEFT ureteral stent in place. No renal or ureteral calculi identified. Normal bowel-gas pattern. IMPRESSION: RIGHT ureteral stent in place Electronically Signed   By: Suzy Bouchard M.D.   On: 12/20/2020 15:30   US RENAL  Result Date: 12/20/2020 CLINICAL DATA:  Urinary retention. 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 EXAM: RENAL / URINARY TRACT ULTRASOUND COMPLETE COMPARISON:  CT 12/21/2020 FINDINGS: Right Kidney: Renal measurements: 14.4 x 9.2 x 7.9 cm = volume: 547 mL. Normal echogenicity. Mild right hydronephrosis, relatively similar to 12/11/2020 CT. Left Kidney: Renal measurements: 12.5 x 3.5 x 6.4 cm = volume: 231 mL. Echogenicity within normal limits.  No mass or hydronephrosis visualized. Bladder: Echogenicity within the posterior bladder is likely due to the right ureteric stent. Other: Mild exam degradation secondary to patient's tenderness. IMPRESSION: 1. Mild right-sided hydronephrosis, relatively similar to 12/17/2020 CT. 2. No other explanation for urinary retention. Electronically Signed   By: Abigail Miyamoto M.D.   On: 12/20/2020 11:18    Labs: BMET Recent Labs  Lab 12/15/20 0755 12/16/20 0857 12/17/20 0319 12/18/20 0409 12/19/20 0720 12/20/20 0527 12/21/20 0357  NA 125* 123* 122* 125* 125* 128* 127*  K 5.4* 5.9* 5.9* 5.2* 5.4* 4.8 5.0  CL 90* 86* 87* 89* 90* 93* 92*  CO2 19* 20* 18* 21* 20* 21* 21*  GLUCOSE 122* 138*  122* 105* 118* 127* 110*  BUN 87* 105* 120* 100* 117* 84* 104*  CREATININE 3.51* 4.68* 5.26* 4.56* 4.78* 4.04* 4.42*  CALCIUM 9.2 8.9 8.6* 8.6* 8.5* 8.4* 8.6*   CBC Recent Labs  Lab 12/18/20 0409 12/19/20 0720 12/20/20 0527 12/21/20 0357  WBC 24.1* 26.8* 25.7* 25.4*  NEUTROABS  --   --   --  22.3*  HGB 13.9 13.3 12.4* 13.2  HCT 41.8 38.1* 37.6* 40.3  MCV 93.9 93.2 94.5 95.5  PLT 360 366 328 355    Medications:     allopurinol  200 mg Oral Daily   artificial tears  1 application Both Eyes QHS   azelastine  1 spray Each Nare Daily   And   fluticasone  1 spray Each Nare Daily   cetirizine  10 mg Oral QHS   Chlorhexidine Gluconate Cloth  6 each Topical Daily   Chlorhexidine Gluconate Cloth  6 each Topical Q0600   dexamethasone  4 mg Oral Q breakfast   enfortumab vedotin-ejfv (PADCEV) CHEMO IV infusion  120 mg Intravenous Once   fentaNYL  1 patch Transdermal Q72H   lactulose  10 g Oral BID   levothyroxine  125 mcg Oral Daily   metoCLOPramide  5 mg Oral TID AC & HS   midodrine       palonosetron  0.25 mg Intravenous Once   polyvinyl alcohol  1 drop Both Eyes TID   senna-docusate  3 tablet Oral BID   Tacrolimus ER  1.5 mg Oral Daily   traZODone  100 mg Oral QHS      Treyveon Mochizuki Tanna Furry, MD  12/21/2020, 9:26 AM

## 2020-12-21 NOTE — Progress Notes (Addendum)
HEMATOLOGY-ONCOLOGY PROGRESS NOTE  SUBJECTIVE: Had HD this morning. Somewhat sleepy at the time of my visit. Wife at the bedside. Pain overall well controlled.  Padcev just hung just prior to my arrival.   REVIEW OF SYSTEMS:   Constitutional: Denies fevers, chills Eyes: Denies blurriness of vision Ears, nose, mouth, throat, and face: Denies mucositis or sore throat Respiratory: Denies cough, dyspnea or wheezes Cardiovascular: Denies palpitation, chest discomfort Gastrointestinal:  Denies nausea, heartburn or change in bowel habits Skin: Denies abnormal skin rashes Lymphatics: Denies new lymphadenopathy or easy bruising Neurological:Denies numbness, tingling or new weaknesses Behavioral/Psych: Mood is stable, no new changes  Extremities: No lower extremity edema All other systems were reviewed with the patient and are negative.  I have reviewed the past medical history, past surgical history, social history and family history with the patient and they are unchanged from previous note.   PHYSICAL EXAMINATION: ECOG PERFORMANCE STATUS: 3 - Symptomatic, >50% confined to bed  Vitals:   12/21/20 1100 12/21/20 1135  BP:  106/67  Pulse: 88 89  Resp: 14 17  Temp: 98 F (36.7 C) 98 F (36.7 C)  SpO2: 96% 97%   Filed Weights   12/19/20 0745 12/19/20 1100 12/20/20 2052  Weight: 104.2 kg 103.2 kg 103.2 kg    Intake/Output from previous day: 01/20 0701 - 01/21 0700 In: 49 [P.O.:372] Out: 350 [Urine:350]  GENERAL:alert, no distress and comfortable NEURO: alert & oriented x 3 with fluent speech, no focal motor/sensory deficits  LABORATORY DATA:  I have reviewed the data as listed CMP Latest Ref Rng & Units 12/21/2020 12/20/2020 12/19/2020  Glucose 70 - 99 mg/dL 110(H) 127(H) 118(H)  BUN 6 - 20 mg/dL 104(H) 84(H) 117(H)  Creatinine 0.61 - 1.24 mg/dL 4.42(H) 4.04(H) 4.78(H)  Sodium 135 - 145 mmol/L 127(L) 128(L) 125(L)  Potassium 3.5 - 5.1 mmol/L 5.0 4.8 5.4(H)  Chloride 98 - 111  mmol/L 92(L) 93(L) 90(L)  CO2 22 - 32 mmol/L 21(L) 21(L) 20(L)  Calcium 8.9 - 10.3 mg/dL 8.6(L) 8.4(L) 8.5(L)  Total Protein 6.5 - 8.1 g/dL 5.7(L) - -  Total Bilirubin 0.3 - 1.2 mg/dL 0.6 - -  Alkaline Phos 38 - 126 U/L 49 - -  AST 15 - 41 U/L 37 - -  ALT 0 - 44 U/L 13 - -    Lab Results  Component Value Date   WBC 25.4 (H) 12/21/2020   HGB 13.2 12/21/2020   HCT 40.3 12/21/2020   MCV 95.5 12/21/2020   PLT 355 12/21/2020   NEUTROABS 22.3 (H) 12/21/2020    CT ABDOMEN PELVIS WO CONTRAST  Result Date: 12/12/2020 CLINICAL DATA:  Cancer of unknown primary in this 60 year old male. EXAM: CT CHEST, ABDOMEN AND PELVIS WITHOUT CONTRAST TECHNIQUE: Multidetector CT imaging of the chest, abdomen and pelvis was performed following the standard protocol without IV contrast. COMPARISON:  CT abdomen and pelvis of the same date. Also with CT of December 30th of 2021 FINDINGS: CT CHEST FINDINGS Cardiovascular: Calcified atheromatous plaque in the thoracic aorta. Heart size is normal following sternotomy for heart transplant by report a [facility] medical center. Three-vessel coronary artery calcification. No pericardial effusion. Calcifications in the LEFT atrium partially seen on previous imaging are of uncertain significance central pulmonary vasculature also with some signs of calcification. Mediastinum/Nodes: Ovoid structure associated with LEFT thyroid 11 mm similar to previous imaging, cervical spine from 2017. Thyroid as described above. Calcifications in the LEFT supraclavicular region may be within venous structures on the LEFT. No axillary  lymphadenopathy. Surgical clips in the RIGHT axilla and soft tissue thickening over the LEFT pectoralis musculature similar grossly compared to remote MRI evaluation 2018 and surgical clips seen on numerous priors in the RIGHT axilla. No mediastinal lymphadenopathy. No gross hilar lymphadenopathy. Lungs/Pleura: Small RIGHT of pleural effusion and basilar airspace  disease. Small nodule in the superior aspect of the RIGHT middle lobe approximately 3 mm on image 57 of series 5. Biapical scarring. Small nodule in the LEFT upper lobe 6 mm (image 51, series 5) airways are patent. Musculoskeletal: Evidence ir sternotomy without significant bony bridging but with sclerotic margins along the sternotomy line similar to studies dating back to August of 2021 with respect to visualized portions on prior abdomen studies no destructive bone finding or acute bone process. CT ABDOMEN PELVIS FINDINGS Hepatobiliary: 1.9 cm lesion in the RIGHT hepatic lobe near the dome of the RIGHT hemi liver does not display attenuation values of simple cyst is unchanged from the study acquired on the same date at another facility. Additionally a 1.4 cm lesion in the RIGHT hepatic lobe on image 23 of series 2 is unchanged. Sludge in the gallbladder. Tiny low-density foci in the LEFT hepatic lobe without change 1 on image 19 of series 2 in the lateral segment measuring 5 mm and another in the medial segment of the LEFT hepatic lobe measuring 12 mm on image 21 of series 2 these areas appears similar dating back to August of 2020, lesions in the RIGHT hepatic lobe are new. Pancreas: Normal pancreatic contour without signs of inflammation. Spleen: Post splenectomy. Adrenals/Urinary Tract: Adrenal glands, normal on the LEFT and obscured on the RIGHT by perinephric and Peri adrenal nodularity. Fullness of the RIGHT renal pelvis with nephroureteral stent in place showing soft tissue density with distension of the renal pelvis and calices similar to the study acquired on the same date. Nephrolithiasis also unchanged. Perinephric nodularity along the medial upper pole the LEFT kidney measures 6.4 x 2.7 cm which is within 1-2 mm of its previous measurement when measured by this observer on the prior study. Increased in size when compared to the study of November 30, 2020. Renal cortical scarring on the LEFT with  nephrolithiasis in the lower pole unchanged from very recent comparison. Urinary bladder with small amount of gas in the urinary bladder with distal aspect of the stent in place, loop coiled in the urinary bladder. Stomach/Bowel: Scattered colonic diverticulosis without acute gastrointestinal process. Normal appendix. Stool and contrast throughout the colon. Vascular/Lymphatic: Calcified atheromatous plaque in the abdominal aorta without aneurysmal dilation. Retroperitoneal adenopathy unchanged from scan earlier the same day, largest lymph nodes behind the inferior vena cava and in the intra-aortocaval groove and in the upper abdomen, largest on image 41 of series 2 measuring 1.9 cm. Smaller lymph nodes along the LEFT periaortic chain. Postoperative changes in the retroperitoneum associated with previous lymphadenectomy No pelvic lymphadenopathy. Reproductive: Prostate with uro lift device deployment bilaterally similar to recent priors. Other: Extensive retroperitoneal fascial thickening/stranding extending into the root of the small bowel mesentery, nodular changes associated with this thickening on the RIGHT outlining the renal fascia this crosses the midline where it is without significant nodularity but with fascial thickening and stranding, also tracking into the pelvis. Small amount of fluid in the pelvis. Musculoskeletal: Spinal degenerative changes. No destructive bone process or acute bone finding. IMPRESSION: 1. Retroperitoneal adenopathy and perinephric nodularity with dilation of the RIGHT renal pelvis and collecting systems, filled with what is suspected to represent neoplasm.  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 Differential considerations given heart transplantation and presumed immunosuppression would include posttransplant lymphoproliferative disorder/lymphoma. 2. Dense material in collecting  systems may represent a mixture of tumor and or blood and there is profound dilation of collecting systems of the RIGHT kidney. This is unchanged compared to the recent comparison study. 3. Signs of hepatic involvement with new lesions in the RIGHT hepatic lobe. 4. Ascites with small amount of ascites with very subtle infiltration of fat in the omentum not mentioned above, for instance on image 50 of series 2 raising the question of peritoneal involvement as well. This area is in the range of 2-3 mm. 5. Small RIGHT pleural effusion and basilar airspace disease. 6. Postoperative changes about the chest likely related to prior heart transplantation with surgical clips in the RIGHT axilla and thickening and calcification along the anterior pectoralis musculature not changed since 2018. 7. Calcification in the LEFT atrium also about the aortic root and pulmonary artery and to a lesser degree in the RIGHT atrium likely reflects changes of vascular anastomotic sites in the setting of heart transplantation. 8. 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 9. Post splenectomy. 10. Aortic atherosclerosis. 11. Small nodule in the LEFT upper lobe 6 mm airways are patent. These results will be called to the ordering clinician or representative by the Radiologist Assistant, and communication documented in the PACS or [employer]. Aortic Atherosclerosis (ICD10-I70.0). Electronically Signed   By: Zetta Bills M.D.   On: 12/17/2020 17:38   DG Chest 1 View  Result Date: 11/29/2020 CLINICAL DATA:  Hypoxia EXAM: CHEST  1 VIEW COMPARISON:  08/03/2020 FINDINGS: Since the prior examination, there has developed right basilar consolidation, possibly infectious in the appropriate clinical setting. Mild focal infiltrate is also noted within the right apex. Left lung is clear. No pneumothorax or pleural effusion. Median sternotomy has been performed.  Cardiac size within normal limits. Multiple healed right rib fractures are noted. Multiple surgical clips are seen within the right axilla. IMPRESSION: Interval development of multifocal pulmonary infiltrate within the right lung, more focal within the right lung base, suspicious for atypical infection in the acute setting. Electronically Signed   By: Fidela Salisbury MD   On: 11/29/2020 22:27   DG Chest 2 View  Result Date: 12/10/2020 CLINICAL DATA:  Bladder cancer.  Follow-up multifocal pneumonia. EXAM: CHEST - 2 VIEW COMPARISON:  11/29/2020 FINDINGS: Prior median sternotomy. Heart is normal size. Patchy right lung airspace disease in the right apex and right lower lung. Minimal left base linear atelectasis or scarring. Overall aeration in the right lung slightly improved. No effusions. IMPRESSION: Patchy right lung airspace disease with slight improvement since prior study. Left base atelectasis or scarring. Electronically Signed   By: Rolm Baptise M.D.   On: 12/03/2020 13:25   DG Abd 1 View  Result Date: 12/20/2020 CLINICAL DATA:  Ureteral stent placement EXAM: ABDOMEN - 1 VIEW COMPARISON:  CT 12/15/2020 FINDINGS: Double-J LEFT ureteral stent in place. No renal or ureteral calculi identified. Normal bowel-gas pattern. IMPRESSION: RIGHT ureteral stent in place Electronically Signed   By: Suzy Bouchard M.D.   On: 12/20/2020 15:30   CT CHEST WO CONTRAST  Result Date: 12/16/2020 CLINICAL DATA:  Cancer of unknown primary in this 60 year old male. EXAM: CT CHEST, ABDOMEN AND PELVIS WITHOUT CONTRAST TECHNIQUE: Multidetector CT imaging of the chest,  abdomen and pelvis was performed following the standard protocol without IV contrast. COMPARISON:  CT abdomen and pelvis of the same date. Also with CT of December 30th of 2021 FINDINGS: CT CHEST FINDINGS Cardiovascular: Calcified atheromatous plaque in the thoracic aorta. Heart size is normal following sternotomy for heart transplant by report a [facility]  medical center. Three-vessel coronary artery calcification. No pericardial effusion. Calcifications in the LEFT atrium partially seen on previous imaging are of uncertain significance central pulmonary vasculature also with some signs of calcification. Mediastinum/Nodes: Ovoid structure associated with LEFT thyroid 11 mm similar to previous imaging, cervical spine from 2017. Thyroid as described above. Calcifications in the LEFT supraclavicular region may be within venous structures on the LEFT. No axillary lymphadenopathy. Surgical clips in the RIGHT axilla and soft tissue thickening over the LEFT pectoralis musculature similar grossly compared to remote MRI evaluation 2018 and surgical clips seen on numerous priors in the RIGHT axilla. No mediastinal lymphadenopathy. No gross hilar lymphadenopathy. Lungs/Pleura: Small RIGHT of pleural effusion and basilar airspace disease. Small nodule in the superior aspect of the RIGHT middle lobe approximately 3 mm on image 57 of series 5. Biapical scarring. Small nodule in the LEFT upper lobe 6 mm (image 51, series 5) airways are patent. Musculoskeletal: Evidence ir sternotomy without significant bony bridging but with sclerotic margins along the sternotomy line similar to studies dating back to August of 2021 with respect to visualized portions on prior abdomen studies no destructive bone finding or acute bone process. CT ABDOMEN PELVIS FINDINGS Hepatobiliary: 1.9 cm lesion in the RIGHT hepatic lobe near the dome of the RIGHT hemi liver does not display attenuation values of simple cyst is unchanged from the study acquired on the same date at another facility. Additionally a 1.4 cm lesion in the RIGHT hepatic lobe on image 23 of series 2 is unchanged. Sludge in the gallbladder. Tiny low-density foci in the LEFT hepatic lobe without change 1 on image 19 of series 2 in the lateral segment measuring 5 mm and another in the medial segment of the LEFT hepatic lobe measuring 12 mm  on image 21 of series 2 these areas appears similar dating back to August of 2020, lesions in the RIGHT hepatic lobe are new. Pancreas: Normal pancreatic contour without signs of inflammation. Spleen: Post splenectomy. Adrenals/Urinary Tract: Adrenal glands, normal on the LEFT and obscured on the RIGHT by perinephric and Peri adrenal nodularity. Fullness of the RIGHT renal pelvis with nephroureteral stent in place showing soft tissue density with distension of the renal pelvis and calices similar to the study acquired on the same date. Nephrolithiasis also unchanged. Perinephric nodularity along the medial upper pole the LEFT kidney measures 6.4 x 2.7 cm which is within 1-2 mm of its previous measurement when measured by this observer on the prior study. Increased in size when compared to the study of November 30, 2020. Renal cortical scarring on the LEFT with nephrolithiasis in the lower pole unchanged from very recent comparison. Urinary bladder with small amount of gas in the urinary bladder with distal aspect of the stent in place, loop coiled in the urinary bladder. Stomach/Bowel: Scattered colonic diverticulosis without acute gastrointestinal process. Normal appendix. Stool and contrast throughout the colon. Vascular/Lymphatic: Calcified atheromatous plaque in the abdominal aorta without aneurysmal dilation. Retroperitoneal adenopathy unchanged from scan earlier the same day, largest lymph nodes behind the inferior vena cava and in the intra-aortocaval groove and in the upper abdomen, largest on image 41 of series 2 measuring 1.9  cm. Smaller lymph nodes along the LEFT periaortic chain. Postoperative changes in the retroperitoneum associated with previous lymphadenectomy No pelvic lymphadenopathy. Reproductive: Prostate with uro lift device deployment bilaterally similar to recent priors. Other: Extensive retroperitoneal fascial thickening/stranding extending into the root of the small bowel mesentery, nodular  changes associated with this thickening on the RIGHT outlining the renal fascia this crosses the midline where it is without significant nodularity but with fascial thickening and stranding, also tracking into the pelvis. Small amount of fluid in the pelvis. Musculoskeletal: Spinal degenerative changes. No destructive bone process or acute bone finding. IMPRESSION: 1. Retroperitoneal adenopathy and perinephric nodularity with dilation of the RIGHT renal pelvis and collecting systems, filled with what is suspected to represent neoplasm. 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 Differential considerations given heart transplantation and presumed immunosuppression would include posttransplant lymphoproliferative disorder/lymphoma. 2. Dense material in collecting systems may represent a mixture of tumor and or blood and there is profound dilation of collecting systems of the RIGHT kidney. This is unchanged compared to the recent comparison study. 3. Signs of hepatic involvement with new lesions in the RIGHT hepatic lobe. 4. Ascites with small amount of ascites with very subtle infiltration of fat in the omentum not mentioned above, for instance on image 50 of series 2 raising the question of peritoneal involvement as well. This area is in the range of 2-3 mm. 5. Small RIGHT pleural effusion and basilar airspace disease. 6. Postoperative changes about the chest likely related to prior heart transplantation with surgical clips in the RIGHT axilla and thickening and calcification along the anterior pectoralis musculature not changed since 2018. 7. Calcification in the LEFT atrium also about the aortic root and pulmonary artery and to a lesser degree in the RIGHT atrium likely reflects changes of vascular anastomotic sites in the setting of heart transplantation. 8. Dense calcification in the region of the LEFT axillary vein  likely reflects calcified chronic thrombus in this location. 9. Post splenectomy. 10. Aortic atherosclerosis. 11. Small nodule in the LEFT upper lobe 6 mm airways are patent. These results will be called to the ordering clinician or representative by the Radiologist Assistant, and communication documented in the PACS or [employer]. Aortic Atherosclerosis (ICD10-I70.0). Electronically Signed   By: Zetta Bills M.D.   On: 12/15/2020 17:38   NM Pulmonary Perfusion  Result Date: 12/04/2020 CLINICAL DATA:  Respiratory failure EXAM: NUCLEAR MEDICINE PERFUSION LUNG SCAN TECHNIQUE: Perfusion images were obtained in multiple projections after intravenous injection of radiopharmaceutical. Ventilation scans intentionally deferred if perfusion scan and chest x-ray adequate for interpretation during COVID 19 epidemic. RADIOPHARMACEUTICALS:  4.1 mCi Tc-18m MAA IV COMPARISON:  Chest x-ray 12/11/2020 FINDINGS: Slightly heterogeneous distribution of radiotracer within the lung fields. Multiple small bilateral wedge-shaped perfusion defects. No corresponding radiographic abnormality. No large mismatched segmental perfusion defect. IMPRESSION: Intermediate probability for pulmonary embolism. Electronically Signed   By: Davina Poke D.O.   On: 12/18/2020 13:30   US RENAL  Result Date: 12/20/2020 CLINICAL DATA:  Urinary retention. 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 EXAM: RENAL / URINARY TRACT ULTRASOUND COMPLETE COMPARISON:  CT 12/10/2020 FINDINGS: Right Kidney: Renal measurements: 14.4 x 9.2 x 7.9 cm = volume: 547 mL. Normal echogenicity. Mild right hydronephrosis, relatively similar to 12/21/2020 CT. Left Kidney: Renal measurements: 12.5 x 3.5 x 6.4 cm = volume: 231 mL. Echogenicity within normal limits. No mass or hydronephrosis visualized. Bladder: Echogenicity within the posterior bladder is likely  due to the right ureteric stent. Other: Mild exam degradation secondary to patient's tenderness.  IMPRESSION: 1. Mild right-sided hydronephrosis, relatively similar to 12/24/2020 CT. 2. No other explanation for urinary retention. Electronically Signed   By: Abigail Miyamoto M.D.   On: 12/20/2020 11:18   IR Fluoro Guide CV Line Left  Result Date: 12/17/2020 INDICATION: 60 year old male referred for hemodialysis catheter placement EXAM: TUNNELED CENTRAL VENOUS HEMODIALYSIS CATHETER PLACEMENT WITH ULTRASOUND AND FLUOROSCOPIC GUIDANCE MEDICATIONS: 1 g vancomycin. The antibiotic was given in an appropriate time interval prior to skin puncture. ANESTHESIA/SEDATION: No moderate sedation. A total of Versed 0.5 mg and Fentanyl 0 mcg was administered intravenously. Moderate Sedation Time: 0 minutes. The patient's level of consciousness and vital signs were monitored continuously by radiology nursing throughout the procedure under my direct supervision. FLUOROSCOPY TIME:  Fluoroscopy Time: 0 minutes 54 seconds (3 mGy). COMPLICATIONS: None PROCEDURE: Informed written consent was obtained from the patient after a discussion of the risks, benefits, and alternatives to treatment. Questions regarding the procedure were encouraged and answered. The right neck and chest were prepped with chlorhexidine in a sterile fashion, and a sterile drape was applied covering the operative field. Maximum barrier sterile technique with sterile gowns and gloves were used for the procedure. A timeout was performed prior to the initiation of the procedure. Ultrasound survey was performed. Micropuncture kit was utilized to access the right internal jugular vein under direct, real-time ultrasound guidance after the overlying soft tissues were anesthetized with 1% lidocaine with epinephrine. Stab incision was made with 11 blade scalpel. Microwire was passed centrally. The microwire was then marked to measure appropriate internal catheter length. External tunneled length was estimated. A total tip to cuff length of 19 cm was selected. 035 guidewire  was advanced to the level of the IVC. Skin and subcutaneous tissues of chest wall below the clavicle were generously infiltrated with 1% lidocaine for local anesthesia. A small stab incision was made with 11 blade scalpel. The selected hemodialysis catheter was tunneled in a retrograde fashion from the anterior chest wall to the venotomy incision. Serial dilation was performed and then a peel-away sheath was placed. The catheter was then placed through the peel-away sheath with tips ultimately positioned within the superior aspect of the right atrium. Final catheter positioning was confirmed and documented with a spot radiographic image. The catheter aspirates and flushes normally. The catheter was flushed with appropriate volume heparin dwells. The catheter exit site was secured with a 0-Prolene retention suture. Gel-Foam slurry was infused into the soft tissue tract. The venotomy incision was closed Derma bond and sterile dressing. Dressings were applied at the chest wall. Patient tolerated the procedure well and remained hemodynamically stable throughout. No complications were encountered and no significant blood loss encountered. IMPRESSION: Status post right IJ tunneled hemodialysis catheter placement. Catheter ready for use. Signed, Dulcy Fanny. Dellia Nims, RPVI [facility] Radiology Electronically Signed   By: Corrie Mckusick D.O.   On: 12/17/2020 15:26   IR US Guide Vasc Access Left  Result Date: 12/17/2020 INDICATION: 60 year old male referred for hemodialysis catheter placement EXAM: TUNNELED CENTRAL VENOUS HEMODIALYSIS CATHETER PLACEMENT WITH ULTRASOUND AND FLUOROSCOPIC GUIDANCE MEDICATIONS: 1 g vancomycin. The antibiotic was given in an appropriate time interval prior to skin puncture. ANESTHESIA/SEDATION: No moderate sedation. A total of Versed 0.5 mg and Fentanyl 0 mcg was administered intravenously. Moderate Sedation Time: 0 minutes. The patient's level  of consciousness and vital signs were monitored continuously by radiology nursing throughout  the procedure under my direct supervision. FLUOROSCOPY TIME:  Fluoroscopy Time: 0 minutes 54 seconds (3 mGy). COMPLICATIONS: None PROCEDURE: Informed written consent was obtained from the patient after a discussion of the risks, benefits, and alternatives to treatment. Questions regarding the procedure were encouraged and answered. The right neck and chest were prepped with chlorhexidine in a sterile fashion, and a sterile drape was applied covering the operative field. Maximum barrier sterile technique with sterile gowns and gloves were used for the procedure. A timeout was performed prior to the initiation of the procedure. Ultrasound survey was performed. Micropuncture kit was utilized to access the right internal jugular vein under direct, real-time ultrasound guidance after the overlying soft tissues were anesthetized with 1% lidocaine with epinephrine. Stab incision was made with 11 blade scalpel. Microwire was passed centrally. The microwire was then marked to measure appropriate internal catheter length. External tunneled length was estimated. A total tip to cuff length of 19 cm was selected. 035 guidewire was advanced to the level of the IVC. Skin and subcutaneous tissues of chest wall below the clavicle were generously infiltrated with 1% lidocaine for local anesthesia. A small stab incision was made with 11 blade scalpel. The selected hemodialysis catheter was tunneled in a retrograde fashion from the anterior chest wall to the venotomy incision. Serial dilation was performed and then a peel-away sheath was placed. The catheter was then placed through the peel-away sheath with tips ultimately positioned within the superior aspect of the right atrium. Final catheter positioning was confirmed and documented with a spot radiographic image. The catheter aspirates and flushes normally. The catheter was flushed with  appropriate volume heparin dwells. The catheter exit site was secured with a 0-Prolene retention suture. Gel-Foam slurry was infused into the soft tissue tract. The venotomy incision was closed Derma bond and sterile dressing. Dressings were applied at the chest wall. Patient tolerated the procedure well and remained hemodynamically stable throughout. No complications were encountered and no significant blood loss encountered. IMPRESSION: Status post right IJ tunneled hemodialysis catheter placement. Catheter ready for use. Signed, Dulcy Fanny. Dellia Nims, RPVI [facility] Radiology Electronically Signed   By: Corrie Mckusick D.O.   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 ANESTHESIA/SEDATION: Fentanyl 100 mcg IV; Versed 4 mg IV Sedation time: 14 minutes; The patient was continuously monitored during the procedure by the interventional radiology nurse under my direct supervision. CONTRAST:  None. COMPLICATIONS: SIR Level A - No therapy, no consequence. Retroperitoneal hemorrhage at site of biopsy, controlled with Gel-Foam slurry administration along needle track. PROCEDURE: Informed consent was obtained from the patient following an explanation of the procedure, risks, benefits and alternatives. A time out was performed prior to the initiation of the procedure. The patient was positioned in a partial left lateral decubitus position on the CT table and a limited CT was performed for procedural planning demonstrating similar appearing multifocal right retroperitoneal lymphadenopathy. The procedure was planned. The operative site was prepped and draped in the usual sterile fashion. Appropriate trajectory was confirmed with a 22 gauge spinal needle  after the adjacent tissues were anesthetized with 1% Lidocaine with epinephrine. Under intermittent CT guidance, a 17 gauge coaxial needle was advanced into the peripheral aspect of the mass. Appropriate positioning was confirmed and a total of 4 samples were obtained with an  18 gauge core needle biopsy device. A limited CT demonstrated focal hemorrhage about the biopsied lymph node. Gel-Foam slurry was injected through the introducer needle along the needle track. The co-axial needle was removed and hemostasis was achieved with manual compression. Additional limited postprocedural CT was negative for expanding hemorrhage or additional complication. A dressing was placed. The patient tolerated the procedure well without immediate postprocedural complication. IMPRESSION: Technically successful CT guided core needle biopsy of right retroperitoneal lymph node. Ruthann Cancer, MD [facility] Radiology Electronically Signed   By: Ruthann Cancer MD   On: 12/07/2020 08:53   DG CHEST PORT 1 VIEW  Result Date: 12/10/2020 CLINICAL DATA:  Hypoxia.  History of cardiac transplant EXAM: PORTABLE CHEST 1 VIEW COMPARISON:  12/15/2020 FINDINGS: Prior median sternotomy. Heart size within normal limits. Atherosclerotic calcification of the aortic knob. Subtle right lower lobe opacity, slightly improved from prior. Trace right pleural effusion. No pneumothorax. IMPRESSION: 1. Subtle right lower lobe opacity, slightly improved from prior. 2. Trace right pleural effusion. Electronically Signed   By: Davina Poke D.O.   On: 12/10/2020 10:33   DG C-Arm 1-60 Min-No Report  Result Date: 11/29/2020 Fluoroscopy was utilized by the requesting physician.  No radiographic interpretation.   ECHOCARDIOGRAM COMPLETE  Result Date: 11/30/2020    ECHOCARDIOGRAM REPORT   Patient Name:   SHAD LEDVINA Guajardo Date of Exam: 11/30/2020 Medical Rec #:  [id number]         Height:       74.0 in  Accession #:    [id number]        Weight:       221.0 lb Date of Birth:  01-Aug-1961        BSA:          2.268 m Patient Age:    38 years          BP:           103/59 mmHg Patient Gender: M                 HR:           94 bpm. Exam Location:  Inpatient Procedure: 2D Echo, Cardiac Doppler, Color Doppler and Intracardiac            Opacification Agent Indications:    Dyspnea R06.00  History:        Patient has prior history of Echocardiogram examinations, most                 recent 05/12/2017. Arrythmias:Atrial Fibrillation; Risk                 Factors:Non-Smoker. GERD. 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  Sonographer:    Vickie Epley RDCS Referring Phys: [id number] [facility]  Sonographer Comments: Suboptimal apical window and suboptimal subcostal window. Pt unable to turn on side due to herniated discs. IMPRESSIONS  1. Left ventricular ejection fraction, by estimation, is 60 to 65%. The left ventricle has normal function. The left ventricle has no regional wall motion abnormalities. Left ventricular diastolic parameters were normal.  2. Right ventricular systolic function is normal. The right ventricular size is normal. There is normal pulmonary artery systolic pressure. The estimated right ventricular systolic pressure is 30.8 mmHg.  3. The mitral valve is normal in structure. No evidence of mitral valve regurgitation. No evidence of mitral stenosis.  4. The aortic valve is normal in structure. Aortic valve regurgitation is not visualized. No aortic stenosis is present.  5. The inferior vena cava is normal in size with greater than 50% respiratory variability, suggesting right atrial pressure of 3 mmHg. FINDINGS  Left Ventricle: Left ventricular ejection fraction, by estimation, is 60 to 65%. The left ventricle has normal function. The left ventricle has no regional wall motion abnormalities. Definity contrast agent was given IV to delineate the left ventricular  endocardial borders. The left ventricular internal  cavity size was normal in size. There is no left ventricular hypertrophy. Left ventricular diastolic parameters were normal. Normal left ventricular filling pressure. Right Ventricle: The right ventricular size is normal. No increase in right ventricular wall thickness. Right ventricular systolic function is normal. There is normal pulmonary artery systolic pressure. The tricuspid regurgitant velocity is 2.39 m/s, and  with an assumed right atrial pressure of 3 mmHg, the estimated right ventricular systolic pressure is 40.9 mmHg. Left Atrium: Left atrial size was normal in size. Right Atrium: Right atrial size was normal in size. Pericardium: There is no evidence of pericardial effusion. Mitral Valve: The mitral valve is normal in structure. No evidence of mitral valve regurgitation. No evidence of mitral valve stenosis. Tricuspid Valve: The tricuspid valve is normal in structure. Tricuspid valve regurgitation is mild . No evidence of tricuspid stenosis. Aortic Valve: The aortic valve is normal in structure. Aortic valve regurgitation is not visualized. No aortic stenosis is present. Pulmonic Valve: The pulmonic valve was normal in structure. Pulmonic valve regurgitation is not visualized. No evidence of pulmonic stenosis. Aorta: The aortic root is normal in size and structure. Venous: The inferior vena cava is normal in size with greater than 50% respiratory variability, suggesting right atrial pressure of 3 mmHg. IAS/Shunts: No atrial level shunt detected by color flow Doppler.  LEFT VENTRICLE PLAX 2D LVIDd:         4.40 cm     Diastology LVIDs:         3.20 cm     LV e' medial:    10.00 cm/s LV PW:         0.90 cm     LV E/e' medial:  7.5 LV IVS:        0.90 cm     LV e' lateral:   11.30 cm/s LVOT diam:     2.50 cm     LV E/e' lateral: 6.7 LV SV:         48 LV SV Index:   21 LVOT Area:     4.91 cm  LV Volumes (MOD) LV vol d, MOD A4C: 80.2 ml LV vol s, MOD A4C: 30.4 ml LV SV MOD A4C:     80.2 ml LEFT ATRIUM            Index LA diam:      2.70 cm 1.19 cm/m LA Vol (A4C): 16.3 ml 7.19 ml/m  AORTIC VALVE LVOT Vmax:   50.20 cm/s LVOT Vmean:  36.700 cm/s LVOT VTI:    0.099 m  AORTA Ao Root diam: 3.40 cm MITRAL VALVE               TRICUSPID VALVE MV Area (PHT): 5.38 cm    TR Peak grad:   22.8 mmHg MV Decel Time: 141 msec    TR Vmax:        239.00 cm/s MV E velocity: 75.40 cm/s MV A velocity: 36.00 cm/s  SHUNTS MV E/A ratio:  2.09        Systemic VTI:  0.10 m  Systemic Diam: 2.50 cm Sanda Klein MD Electronically signed by Sanda Klein MD Signature Date/Time: 11/30/2020/12:30:40 PM    Final    VAS [place] LOWER EXTREMITY VENOUS (DVT)  Result Date: 12/10/2020  Lower Venous DVT Study Indications: Pain.  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 Comparison Study: Previous 12/02/20 Negative Performing Technologist: Vonzell Schlatter RVT  Examination Guidelines: A complete evaluation includes B-mode imaging, spectral Doppler, color Doppler, and power Doppler as needed of all accessible portions of each vessel. Bilateral testing is considered an integral part of a complete examination. Limited examinations for reoccurring indications may be performed as noted. The reflux portion of the exam is performed with the patient in reverse Trendelenburg.  +---------+---------------+---------+-----------+----------+--------------+ RIGHT    CompressibilityPhasicitySpontaneityPropertiesThrombus Aging +---------+---------------+---------+-----------+----------+--------------+ CFV      Full           Yes      Yes                                 +---------+---------------+---------+-----------+----------+--------------+ SFJ      Full                                                        +---------+---------------+---------+-----------+----------+--------------+ FV Prox  Full                                                         +---------+---------------+---------+-----------+----------+--------------+ FV Mid   Full                                                        +---------+---------------+---------+-----------+----------+--------------+ FV DistalFull                                                        +---------+---------------+---------+-----------+----------+--------------+ PFV      Full                                                        +---------+---------------+---------+-----------+----------+--------------+ POP      Full           Yes      Yes                                 +---------+---------------+---------+-----------+----------+--------------+ PTV      None                                                        +---------+---------------+---------+-----------+----------+--------------+  PERO     None                                                        +---------+---------------+---------+-----------+----------+--------------+   +---------+---------------+---------+-----------+----------+--------------+ LEFT     CompressibilityPhasicitySpontaneityPropertiesThrombus Aging +---------+---------------+---------+-----------+----------+--------------+ CFV      Full           Yes      Yes                                 +---------+---------------+---------+-----------+----------+--------------+ SFJ      Full                                                        +---------+---------------+---------+-----------+----------+--------------+ FV Prox  Full                                                        +---------+---------------+---------+-----------+----------+--------------+ FV Mid   Full                                                        +---------+---------------+---------+-----------+----------+--------------+ FV DistalFull                                                         +---------+---------------+---------+-----------+----------+--------------+ PFV      Full                                                        +---------+---------------+---------+-----------+----------+--------------+ POP      Full           Yes      Yes                                 +---------+---------------+---------+-----------+----------+--------------+ PTV      Full                                                        +---------+---------------+---------+-----------+----------+--------------+ PERO     Full                                                        +---------+---------------+---------+-----------+----------+--------------+  Summary: RIGHT: - Findings consistent with acute deep vein thrombosis involving one right posterior tibial vein, and both right peroneal veins. - No cystic structure found in the popliteal fossa.  LEFT: - There is no evidence of deep vein thrombosis in the lower extremity.  - No cystic structure found in the popliteal fossa.  *See table(s) above for measurements and observations. Electronically signed by Jamelle Haring on 12/10/2020 at 7:16:00 PM.    Final    VAS [place] LOWER EXTREMITY VENOUS (DVT)  Result Date: 12/03/2020  Lower Venous DVT Study Indications: Pain.  Comparison Study: Previous 12/2019 Performing Technologist: Vonzell Schlatter RVT  Examination Guidelines: A complete evaluation includes B-mode imaging, spectral Doppler, color Doppler, and power Doppler as needed of all accessible portions of each vessel. Bilateral testing is considered an integral part of a complete examination. Limited examinations for reoccurring indications may be performed as noted. The reflux portion of the exam is performed with the patient in reverse Trendelenburg.  +---------+---------------+---------+-----------+----------+--------------+ RIGHT    CompressibilityPhasicitySpontaneityPropertiesThrombus Aging  +---------+---------------+---------+-----------+----------+--------------+ CFV      Full           Yes      Yes                                 +---------+---------------+---------+-----------+----------+--------------+ SFJ      Full                                                        +---------+---------------+---------+-----------+----------+--------------+ FV Prox  Full                                                        +---------+---------------+---------+-----------+----------+--------------+ FV Mid   Full                                                        +---------+---------------+---------+-----------+----------+--------------+ FV DistalFull                                                        +---------+---------------+---------+-----------+----------+--------------+ PFV      Full                                                        +---------+---------------+---------+-----------+----------+--------------+ POP      Full           Yes      Yes                                 +---------+---------------+---------+-----------+----------+--------------+ PTV  Full                                                        +---------+---------------+---------+-----------+----------+--------------+ PERO     Full                                                        +---------+---------------+---------+-----------+----------+--------------+   +---------+---------------+---------+-----------+----------+--------------+ LEFT     CompressibilityPhasicitySpontaneityPropertiesThrombus Aging +---------+---------------+---------+-----------+----------+--------------+ CFV      Full           Yes      Yes                                 +---------+---------------+---------+-----------+----------+--------------+ SFJ      Full                                                         +---------+---------------+---------+-----------+----------+--------------+ FV Prox  Full                                                        +---------+---------------+---------+-----------+----------+--------------+ FV Mid   Full                                                        +---------+---------------+---------+-----------+----------+--------------+ FV DistalFull                                                        +---------+---------------+---------+-----------+----------+--------------+ PFV      Full                                                        +---------+---------------+---------+-----------+----------+--------------+ POP      Full           Yes      Yes                                 +---------+---------------+---------+-----------+----------+--------------+ PTV      Full                                                        +---------+---------------+---------+-----------+----------+--------------+  PERO     Full                                                        +---------+---------------+---------+-----------+----------+--------------+     Summary: RIGHT: - There is no evidence of deep vein thrombosis in the lower extremity.  - No cystic structure found in the popliteal fossa.  LEFT: - There is no evidence of deep vein thrombosis in the lower extremity.  - No cystic structure found in the popliteal fossa.  *See table(s) above for measurements and observations. Electronically signed by Ruta Hinds MD on 12/03/2020 at 12:15:41 PM.    Final    ECHOCARDIOGRAM LIMITED  Result Date: 12/10/2020    ECHOCARDIOGRAM LIMITED REPORT   Patient Name:   JACKSEN ISIP Milleson Date of Exam: 12/10/2020 Medical Rec #:  [id number]         Height:       74.0 in Accession #:    [id number]        Weight:       230.4 lb Date of Birth:  November 07, 1961        BSA:          2.308 m Patient Age:    71 years          BP:           105/72 mmHg Patient Gender: M                  HR:           99 bpm. Exam Location:  Inpatient Procedure: Limited Echo, Cardiac Doppler and Color Doppler Indications:    Acute DVT  History:        Patient has prior history of Echocardiogram examinations, most                 recent 11/30/2020. Arrythmias:Atrial Fibrillation. CKD. GERD.                 S/P heart transplant 01/22/16.  Sonographer:    Clayton Lefort RDCS (AE) Referring Phys: [id number] [facility]TE[facility]  Sonographer Comments: Technically difficult study due to poor echo windows, suboptimal apical window and suboptimal subcostal window. Unable to roll patient due to herniated discs. IMPRESSIONS  1. S/P Heart Transplant. Left ventricular ejection fraction, by estimation, is 60 to 65%. The left ventricle has normal function. Left ventricular endocardial border not optimally defined to evaluate regional wall motion. There is moderate concentric left ventricular hypertrophy. Left ventricular diastolic function could not be evaluated.  2. Right ventricular systolic function is moderately reduced. The right ventricular size is mildly enlarged. Tricuspid regurgitation signal is inadequate for assessing PA pressure.  3. A small pericardial effusion is present. The pericardial effusion is surrounding the apex.  4. Tricuspid valve regurgitation is mild to moderate. FINDINGS  Left Ventricle: S/P Heart Transplant. Left ventricular ejection fraction, by estimation, is 60 to 65%. The left ventricle has normal function. Left ventricular endocardial border not optimally defined to evaluate regional wall motion. There is moderate concentric left ventricular hypertrophy. Left ventricular diastolic function could not be evaluated. Right Ventricle: The right ventricular size is mildly enlarged. Right ventricular systolic function is moderately reduced. Tricuspid regurgitation signal is inadequate for assessing PA pressure. Pericardium: A small pericardial effusion is present. The pericardial effusion is surrounding  the apex. Tricuspid Valve: Tricuspid valve regurgitation is mild to moderate. Venous: The inferior vena cava was not well visualized. 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  Molecular studies pending. -12/04/2020 CT chest/abdomen/pelvis without contrast showed "1. Retroperitoneal adenopathy and perinephric nodularity with dilation of the RIGHT renal pelvis and collecting systems, filled with what is suspected to represent neoplasm. 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 Differential considerations given heart transplantation and presumed immunosuppression would include posttransplant lymphoproliferative disorder/lymphoma. 2. Dense material in collecting systems may represent a mixture of tumor and or blood and there is profound dilation of collecting systems of the RIGHT kidney. This is unchanged compared to the recent comparison study. 3. Signs of hepatic involvement with new lesions in the RIGHT hepatic lobe. 4. Ascites with small amount of ascites with very subtle infiltration of fat in the omentum not mentioned above, for instance on image 50 of series 2 raising the question of peritoneal involvement as well. This area is in the range of 2-3 mm. 5. Small RIGHT pleural effusion and basilar airspace  disease. 6. Postoperative changes about the chest likely related to prior heart transplantation with surgical clips in the RIGHT axilla and thickening and calcification along the anterior pectoralis musculature not changed since 2018. 7. Calcification in the LEFT atrium also about the aortic root and pulmonary artery and to a lesser degree in the RIGHT atrium likely reflects changes of vascular anastomotic sites in the setting of heart transplantation. 8. 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 9. Post splenectomy. 10. Aortic atherosclerosis. 11. Small nodule in the LEFT upper lobe 6 mm airways are patent."  2) Back pain secondary to #1  3) Hyponatremia  4) Leukocytosis and Thrombocytosis  5) CKD  6) High-grade T1 bladder cancer  7) History of Hodgkin's lymphoma  8) status post heart transplant  PLAN: -Labs from today reviewed and adequate to proceed with Day 8 of cycle #1 of his chemo today. Day 15 is due on 12/28/2020. If he is discharged, will arrange for this to be given at the [facility]. If he remains inpatient, will arrange for inpatient administration.  -We will follow-up on molecular studies and PD-L1 testing. -Request for second opinion at [facility] has been sent.  Awaiting appointment to be arranged. -He is aware that this is an incurable condition. -Continue hemodialysis per nephrology. -Recommend ongoing care by the palliative care team to assist with goals of care discussion and  pain management. -Continue IV heparin for lower extremity DVT which was likely triggered by malignancy and immobility.  If renal function stabilizes, can transition to Lovenox 1 mg/kg once daily and check Xa levels to adjust dose.   LOS: 14 days   Mikey Bussing, DNP, AGPCNP-BC, AOCNP 12/21/20   ADDENDUM  .Patient was Personally and independently interviewed, examined and relevant elements of the history of  present illness were reviewed in details and an assessment and plan was created. All elements of the patient's history of present illness , assessment and plan were discussed in details with Mikey Bussing, DNP, AGPCNP-BC, AOCNP. The above documentation reflects our combined findings assessment and plan.  Tolerating C1D8 treatment well with on acute toxicities. Po intake gradually increasing but still limited. Discussed with patient and added Marinol for anorexia. Okay to wean down dexamethasone to Prednisone $RemoveBefor'5mg'[id number]$  po daily (his baseline dose for heart transplant).  Sullivan Lone MD MS

## 2020-12-21 NOTE — Plan of Care (Signed)
  Problem: Clinical Measurements: Goal: Ability to maintain clinical measurements within normal limits will improve Outcome: Progressing Goal: Will remain free from infection Outcome: Progressing Goal: Diagnostic test results will improve Outcome: Progressing Goal: Respiratory complications will improve Outcome: Progressing   Problem: Activity: Goal: Risk for activity intolerance will decrease Outcome: Progressing   Problem: Nutrition: Goal: Adequate nutrition will be maintained Outcome: Progressing   Problem: Coping: Goal: Level of anxiety will decrease Outcome: Progressing   Problem: Elimination: Goal: Will not experience complications related to bowel motility Outcome: Progressing   Problem: Pain Managment: Goal: General experience of comfort will improve Outcome: Progressing   Problem: Safety: Goal: Ability to remain free from injury will improve Outcome: Progressing   Problem: Skin Integrity: Goal: Risk for impaired skin integrity will decrease Outcome: Progressing   Problem: Elimination: Goal: Will not experience complications related to urinary retention Outcome: Progressing   Problem: Education: Goal: Knowledge of the prescribed therapeutic regimen will improve Outcome: Progressing   Problem: Activity: Goal: Ability to implement measures to reduce episodes of fatigue will improve Outcome: Progressing   Problem: Bowel/Gastric: Goal: Will not experience complications related to bowel motility Outcome: Progressing   Problem: Coping: Goal: Ability to identify and develop effective coping behavior will improve Outcome: Progressing   Problem: Nutritional: Goal: Maintenance of adequate nutrition will improve Outcome: Progressing

## 2020-12-22 DIAGNOSIS — C7910 Secondary malignant neoplasm of unspecified urinary organs: Secondary | ICD-10-CM | POA: Diagnosis not present

## 2020-12-22 LAB — RENAL FUNCTION PANEL
Albumin: 3.1 g/dL — ABNORMAL LOW (ref 3.5–5.0)
Anion gap: 15 (ref 5–15)
BUN: 80 mg/dL — ABNORMAL HIGH (ref 6–20)
CO2: 20 mmol/L — ABNORMAL LOW (ref 22–32)
Calcium: 8.8 mg/dL — ABNORMAL LOW (ref 8.9–10.3)
Chloride: 96 mmol/L — ABNORMAL LOW (ref 98–111)
Creatinine, Ser: 3.46 mg/dL — ABNORMAL HIGH (ref 0.61–1.24)
GFR, Estimated: 20 mL/min — ABNORMAL LOW (ref >60)
Glucose, Bld: 101 mg/dL — ABNORMAL HIGH (ref 70–99)
Phosphorus: 5.5 mg/dL — ABNORMAL HIGH (ref 2.5–4.6)
Potassium: 5.3 mmol/L — ABNORMAL HIGH (ref 3.5–5.1)
Sodium: 131 mmol/L — ABNORMAL LOW (ref 135–145)

## 2020-12-22 LAB — HEPATIC FUNCTION PANEL
ALT: 12 U/L (ref 0–44)
AST: 46 U/L — ABNORMAL HIGH (ref 15–41)
Albumin: 2.9 g/dL — ABNORMAL LOW (ref 3.5–5.0)
Alkaline Phosphatase: 47 U/L (ref 38–126)
Bilirubin, Direct: 0.2 mg/dL (ref 0.0–0.2)
Indirect Bilirubin: 0.7 mg/dL (ref 0.3–0.9)
Total Bilirubin: 0.9 mg/dL (ref 0.3–1.2)
Total Protein: 5.6 g/dL — ABNORMAL LOW (ref 6.5–8.1)

## 2020-12-22 LAB — CBC
HCT: 42.1 % (ref 39.0–52.0)
Hemoglobin: 13.6 g/dL (ref 13.0–17.0)
MCH: 31.2 pg (ref 26.0–34.0)
MCHC: 32.3 g/dL (ref 30.0–36.0)
MCV: 96.6 fL (ref 80.0–100.0)
Platelets: 336 10*3/uL (ref 150–400)
RBC: 4.36 MIL/uL (ref 4.22–5.81)
RDW: 16.1 % — ABNORMAL HIGH (ref 11.5–15.5)
WBC: 22.1 10*3/uL — ABNORMAL HIGH (ref 4.0–10.5)
nRBC: 0.2 % (ref 0.0–0.2)

## 2020-12-22 LAB — HEPARIN LEVEL (UNFRACTIONATED): Heparin Unfractionated: 0.69 IU/mL (ref 0.30–0.70)

## 2020-12-22 MED ORDER — PREDNISONE 5 MG PO TABS
5.0000 mg | ORAL_TABLET | Freq: Every day | ORAL | Status: DC
  Administered 2020-12-26 – 2021-01-01 (×7): 5 mg via ORAL
  Filled 2020-12-22 (×10): qty 1

## 2020-12-22 MED ORDER — NYSTATIN 100000 UNIT/ML MT SUSP
5.0000 mL | Freq: Four times a day (QID) | OROMUCOSAL | Status: DC
  Administered 2020-12-22 – 2021-01-01 (×18): 500000 [IU] via ORAL
  Filled 2020-12-22 (×22): qty 5

## 2020-12-22 MED ORDER — MAGIC MOUTHWASH W/LIDOCAINE
10.0000 mL | Freq: Four times a day (QID) | ORAL | Status: DC | PRN
  Administered 2020-12-22 – 2021-01-01 (×11): 10 mL via ORAL
  Filled 2020-12-22 (×13): qty 10

## 2020-12-22 MED ORDER — PREDNISONE 20 MG PO TABS
20.0000 mg | ORAL_TABLET | Freq: Once | ORAL | Status: AC
  Administered 2020-12-23: 20 mg via ORAL
  Filled 2020-12-22: qty 1

## 2020-12-22 MED ORDER — HYDROMORPHONE HCL 2 MG PO TABS
2.0000 mg | ORAL_TABLET | ORAL | Status: DC | PRN
  Administered 2020-12-23 – 2020-12-31 (×4): 2 mg via ORAL
  Filled 2020-12-22 (×5): qty 1

## 2020-12-22 MED ORDER — PREDNISONE 5 MG PO TABS
15.0000 mg | ORAL_TABLET | Freq: Once | ORAL | Status: AC
  Administered 2020-12-24: 15 mg via ORAL

## 2020-12-22 MED ORDER — PREDNISONE 10 MG PO TABS
10.0000 mg | ORAL_TABLET | Freq: Once | ORAL | Status: AC
  Administered 2020-12-25: 10 mg via ORAL
  Filled 2020-12-22: qty 1

## 2020-12-22 NOTE — Progress Notes (Signed)
[facility] Progress Note     Assessment/ Plan:   1. Dialysis Dependent AKI on CKD 3a - b/l creat 1.2- 1.4 from oct 2021.   Renal function worsening over last few weeks. UA +wbc's and rbc's, no sig proteinuria. Imaging shows stable R ureteral stent and stable R collecting system dilatation. Has new significant leg edema. ECHO showed normal EF.   Creat 1.8 on admission > up to 2.3 then 2.8 after lasix tried, then 3.1 >  3.2 > 3.4 > up to 4.5 -- HD.  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   IR placed [facility] 1/17 R IJ, HD #1 1/17  HD #3 1/21: 3hr, intra-dialytic hypotension therefore order midorine 10 mg and albumin 25 g. UF as tolerated.  Plan for next HD on 1/24. 2. Hyperkalemia - Improved, Stop lokelma 3. Hyponatremia -  Inability to excrete free water 2/2 low GFR. Fluid restriction, will correct with HD. 4. High-grade bladder cancer- recent dx , treated w/ BCG in Aug 2021, sp TURBT x 2 Aug and Sept. New RP adenopathy w/ R hydro in Dec 2021 > cysto and R ureteral stent per urology. Biopsy RP mass done last week showed poorly diff urothelial carcinoma, molecular markers pending. Onc consulting w/ [facility].  Has started CTX with Dr. Irene Limbo 5. Volume excess - increase UF as able 6. H/o Hodgkin's lymphoma - remote 7. H/o cardiac transplant - taking prograf 1.5 mg /d and pred 5 /d per CV 8. Acute RLE DVT - per primary team. Positive for superficial venous thrombosis/thrombophlebitis of the mid right leg greater saphenous vein but neg for DVT. 9. Chronic diast CHF - had prior LVEF 10% but now has corrected to 60-65%   Subjective:    No new event.  Denies nausea vomiting chest pain shortness of breath.  Reports feeling much better.  Had dialysis yesterday.   Objective:   BP 123/84 (BP Location: Right Arm)   Pulse 88   Temp 98.5 F (36.9 C) (Oral)   Resp 18   Ht 6\' 2"  (1.88 m)   Wt 103.2 kg   SpO2 98%    BMI 29.21 kg/m   Intake/Output Summary (Last 24 hours) at 12/22/2020 1107 Last data filed at 12/22/2020 0412 Gross per 24 hour  Intake 1269.49 ml  Output 325 ml  Net 944.49 ml   Weight change:   Physical Exam: Genalert, NAD No jvd or bruits Chest clear bilatto bases RRR no MRG Abdprotuberant, no ascites by exam, no hsm, +BS Ext3+ pitting bilat LE from feet to hips to abd/chest wall Neuro is alert, Ox 3 , nf TDC site clean.   Imaging: DG Abd 1 View  Result Date: 12/20/2020 CLINICAL DATA:  Ureteral stent placement EXAM: ABDOMEN - 1 VIEW COMPARISON:  CT 12/22/2020 FINDINGS: Double-J LEFT ureteral stent in place. No renal or ureteral calculi identified. Normal bowel-gas pattern. IMPRESSION: RIGHT ureteral stent in place Electronically Signed   By: Suzy Bouchard M.D.   On: 12/20/2020 15:30    Labs: BMET Recent Labs  Lab 12/16/20 0857 12/17/20 0319 12/18/20 0409 12/19/20 0720 12/20/20 0527 12/21/20 0357  NA 123* 122* 125* 125* 128* 127*  K 5.9* 5.9* 5.2* 5.4* 4.8 5.0  CL 86* 87* 89* 90* 93* 92*  CO2 20* 18* 21* 20* 21* 21*  GLUCOSE 138* 122* 105* 118* 127* 110*  BUN 105* 120* 100* 117* 84* 104*  CREATININE 4.68* 5.26* 4.56*  4.78* 4.04* 4.42*  CALCIUM 8.9 8.6* 8.6* 8.5* 8.4* 8.6*   CBC Recent Labs  Lab 12/19/20 0720 12/20/20 0527 12/21/20 0357 12/22/20 0404  WBC 26.8* 25.7* 25.4* 22.1*  NEUTROABS  --   --  22.3*  --   HGB 13.3 12.4* 13.2 13.6  HCT 38.1* 37.6* 40.3 42.1  MCV 93.2 94.5 95.5 96.6  PLT 366 328 355 336    Medications:    . allopurinol  200 mg Oral Daily  . artificial tears  1 application Both Eyes QHS  . azelastine  1 spray Each Nare Daily   And  . fluticasone  1 spray Each Nare Daily  . cetirizine  10 mg Oral QHS  . Chlorhexidine Gluconate Cloth  6 each Topical Daily  . Chlorhexidine Gluconate Cloth  6 each Topical Q0600  . dexamethasone  4 mg Oral Q breakfast  . dronabinol  2.5 mg Oral BID AC  . fentaNYL  1 patch Transdermal Q72H   . lactulose  10 g Oral BID  . levothyroxine  125 mcg Oral Daily  . metoCLOPramide  5 mg Oral TID AC & HS  . polyvinyl alcohol  1 drop Both Eyes TID  . senna-docusate  3 tablet Oral BID  . Tacrolimus ER  1.5 mg Oral Daily  . traZODone  100 mg Oral QHS      Chelsea Pedretti Tanna Furry, MD  12/22/2020, 11:07 AM

## 2020-12-22 NOTE — Progress Notes (Addendum)
[facility]                                                                              Patient Demographics  Glenn Gonzalez, is a 60 y.o. male, DOB - [birth year], RCB:[id number]  Admit date - 12/17/2020   Admitting Physician Desiree Hane, MD  Outpatient Primary MD for the patient is Lajean Manes, MD  Outpatient specialists:   LOS - 15  days   Medical records reviewed and are as summarized below: Chief complaint on admission: Back pain   Brief summary  Glenn Lotter Duehringis a 60 y.o.year old malewith medical history significant for Hodgkin lymphoma, cardiac transplant on tacrolimus and prednisone, chronic combined systolic/diastolic CHF, GERD, high-grade T1 bladder cancer with recent hospitalization from 80/32-1/2 for AKI complicated by hydronephrosis in the setting of new retroperitoneal lymphadenopathy. Patient underwent stent placement by urology and was treated for postoperative multifocal pneumonia.  He presented from urology office with reports of increased back pain not well controlled on oral pain medication as well as generalized fatigue, poor appetite. He was brought in under observation due to concern for back pain in the setting of known retroperitoneal lymphadenopathy.  Hospital course complicated by acute hyponatremia in the setting of poor appetite requiring IV fluids and persistent leukocytosis. CT abdomen imaging consistent with retroperitoneal adenopathy and dilation of right renal pelvis and collecting systems concerning for urothelial neoplasm with extensive metastatic process. Found to have acute right lower extremity DVT on venous duplex started on heparin on 1/10. Has opted for dialysis while awaiting [facility] response.  1/17: Tunneled dialysis cath placed by IR, initiated hemodialysis, first 1/17, tolerated 1/18: second hemodialysis planned 1/21: Chemotherapy, second planned today 1/21, undergoing HD  Assessment & Plan    Principal  Problem: Retroperitoneal adenopathy and perinephric nodularity, high-grade bladder CA, recently diagnosed -Status post IR guided retroperitoneal biopsy, pathology positive for poorly differentiated carcinoma.  Recent hospitalization 24/82-5/0 for AKI complicated by hydronephrosis in the setting of new retroperitoneal LAD, underwent stent placement. - Urology and oncology following. - Oncology consulted, sent for molecular testing and hoping to discuss with [facility] oncology regarding patient's options --Startedon palliative chemo 1/14, palliative medicine following -  C1D8 Padcev chemo  On 1/21, next due 1.28   AKI on CKD stage 3a, requiring dialysis, new problem  -Baseline creatinine 1.2-1.4 from 08/2020, 1.8 from last hospitalization -Suspect lymphatic obstruction due to malignancy, low albumin, renal function worsening over the last few weeks.  Nephrology was consulted. -Tunneled HD line was placed on the 1/17 and he has tolerated dialysis for 3 sessions.  Bladder was noted to be distended on 120 he was seen by urology who recommended placement of a Foley if he was unable to void.  Again today patient reports no issues voiding.  Hyperkalemia -Chronic stable per nephrology, previously on [facility] which has been discontinued.  Acute right lower extremity DVT -Continue IV heparin, H&H stable -If no further procedures planned will discuss transition to apixaban as his hemoglobin has been stable and if he continues to require dialysis would be unable to transition him to Lovenox.  If he is able to get off dialysis ideally  he could be transitioned to Lovenox with monitoring of Xa levels. Discuss with oncology on 1/24.   Elevated ALT- monitor as immunosuppressive medications cleared hepatically   Small retroperitoneal hemorrhage status post biopsy -H&H stable  Acute on chronic hyponatremia -Likely due to hypovolemia and renal failure, now improving with dialysis  Small pericardial  effusion -Incidentally noted on echocardiogram, without tamponade physiology  Chronic combined systolic and diastolic heart failure, history of cardiac transplant -Patient is status post heart transplant, prior EF 10% - EF has now corrected to 60 to 65% -Volume control with HD, initiated on 1/17 -Dr. Haroldine Laws was was contacted on admission as he has been in contact with transplant team as below.  History of cardiac transplant -Continue tacrolimus, to keep goal levels between 5-8 -Continue dexamethasone, reduced dose to 4 mg daily by Dr. Irene Limbo on 1/18 and okay to wean down to his baseline prednisone 5 mg daily--per note from Dr. Jeffie Pollock, to taper as quickly as possible which per Dr. Grier Mitts note okay to do so---will schedule taper with pharmacy taper scheduled and ordered--down to 5 mg on Wednesday.  -Cardiology was consulted, per Dr Haroldine Laws, EF is normal, recommended to continue the immunosuppressive regimen and follow the tacrolimus levels. Dr. Haroldine Laws has notified/updated the transplant team at [facility].   Leukocytosis-clinically without infection, will discuss with patient and pharmacy about home trimethoprim which should likely be restarted at this time at appropriate dosing given his kidney disease.  This is improving he has been afebrile without signs of infection at this time.  Suspect this is largely due to his underlying malignancy.  If persists would recommend obtaining blood cultures and possibly a urine culture, not obtained during this admission yet.  Hypothyroidism -Continue Synthroid  Code Status: DNR DVT Prophylaxis: IV heparin Family Communication: Discussed all imaging results, lab results, explained to the patient.  Also provided update to sister on phone with patient at bedside.  Disposition Plan:     Status is: Inpatient  Remains inpatient appropriate because:Still requiring hemodialysis and on heparin infusion which could optimally be transitioned to apixaban or  Lovenox in coming days.   Dispo: The patient is from: Home              Anticipated d/c is to: Home              Anticipated d/c date is: 3 days              Patient currently is not medically stable to d/c.   Difficult to place patient No  Time Spent in minutes   35 minutes  Procedures:  HD cath 1.17 IR guided biopsy 1/06  Consultants:   Nephrology Oncology Palliative IR  Antimicrobials:   Anti-infectives (From admission, onward)   Start     Dose/Rate Route Frequency Ordered Stop   12/17/20 0830  vancomycin (VANCOCIN) IVPB 1000 mg/200 mL premix        1,000 mg 200 mL/hr over 60 Minutes Intravenous To Radiology 12/17/20 0744 12/17/20 0932   12/26/2020 2200  cefdinir (OMNICEF) capsule 300 mg        300 mg Oral 2 times daily 12/16/2020 1716 12/10/20 2159         Medications  Scheduled Meds: . allopurinol  200 mg Oral Daily  . artificial tears  1 application Both Eyes QHS  . azelastine  1 spray Each Nare Daily   And  . fluticasone  1 spray Each Nare Daily  . cetirizine  10 mg Oral QHS  .  Chlorhexidine Gluconate Cloth  6 each Topical Daily  . Chlorhexidine Gluconate Cloth  6 each Topical Q0600  . dexamethasone  4 mg Oral Q breakfast  . dronabinol  2.5 mg Oral BID AC  . fentaNYL  1 patch Transdermal Q72H  . lactulose  10 g Oral BID  . levothyroxine  125 mcg Oral Daily  . metoCLOPramide  5 mg Oral TID AC & HS  . polyvinyl alcohol  1 drop Both Eyes TID  . senna-docusate  3 tablet Oral BID  . Tacrolimus ER  1.5 mg Oral Daily  . traZODone  100 mg Oral QHS   Continuous Infusions: . sodium chloride    . heparin 1,500 Units/hr (12/22/20 0945)   PRN Meds:.acetaminophen **OR** acetaminophen, bisacodyl, HYDROmorphone (DILAUDID) injection, magnesium hydroxide, midodrine, ondansetron **OR** ondansetron (ZOFRAN) IV, pantoprazole, prochlorperazine      Subjective:   Natanel Snavely was seen and examined today.  Patient reports he is feeling somnolent from his  medications for pain but then his pain is uncontrolled.  He reports a growing tolerance to opioids over the past months due to his cancer diagnosis.  We discussed optimally if he wishes to go home in the future we will transition to orals which we discussed at length.  Options given and he is amenable to trying oral Dilaudid for moderate pain today.  Objective:   Vitals:   12/21/20 1135 12/21/20 1822 12/21/20 2101 12/22/20 0447  BP: 106/67 111/73 102/73 123/84  Pulse: 89 83 76 88  Resp: 17 17 17 18   Temp: 98 F (36.7 C) 98.6 F (37 C) 98.1 F (36.7 C) 98.5 F (36.9 C)  TempSrc:    Oral  SpO2: 97% 94% 93% 98%  Weight:      Height:        Intake/Output Summary (Last 24 hours) at 12/22/2020 1547 Last data filed at 12/22/2020 1300 Gross per 24 hour  Intake 1389.49 ml  Output 675 ml  Net 714.49 ml     Wt Readings from Last 3 Encounters:  12/20/20 103.2 kg  11/29/20 100.2 kg  08/31/20 101.2 kg   Physical Exam  General: Alert and oriented x 3, NAD  Cardiovascular: S1 S2 clear, RRR.  Significant edema to the mid thigh  Respiratory: CTAB, TDC in right upper chest wall, no surrounding erythema  Gastrointestinal: Soft, nontender, nondistended, NBS  Ext: 2+ pedal edema bilaterally  Neuro: no new deficits  Musculoskeletal: No cyanosis, clubbing  Skin: No rashes  Psych: Normal affect and demeanor, alert and oriented x3    Data Reviewed:  I have personally reviewed following labs and imaging studies  Micro Results No results found for this or any previous visit (from the past 240 hour(s)). COVID negative on admission  Radiology Reports: Reviewed echocardiogram  Lab Data:  CBC: Recent Labs  Lab 12/18/20 0409 12/19/20 0720 12/20/20 0527 12/21/20 0357 12/22/20 0404  WBC 24.1* 26.8* 25.7* 25.4* 22.1*  NEUTROABS  --   --   --  22.3*  --   HGB 13.9 13.3 12.4* 13.2 13.6  HCT 41.8 38.1* 37.6* 40.3 42.1  MCV 93.9 93.2 94.5 95.5 96.6  PLT 360 366 328 355 182    Basic Metabolic Panel: Recent Labs  Lab 12/18/20 0409 12/19/20 0720 12/20/20 0527 12/21/20 0357 12/22/20 0948  NA 125* 125* 128* 127* 131*  K 5.2* 5.4* 4.8 5.0 5.3*  CL 89* 90* 93* 92* 96*  CO2 21* 20* 21* 21* 20*  GLUCOSE 105* 118* 127* 110* 101*  BUN 100* 117* 84* 104* 80*  CREATININE 4.56* 4.78* 4.04* 4.42* 3.46*  CALCIUM 8.6* 8.5* 8.4* 8.6* 8.8*  PHOS  --   --   --   --  5.5*   GFR: Estimated Creatinine Clearance: 29.5 mL/min (A) (by C-G formula based on SCr of 3.46 mg/dL (H)). Liver Function Tests: Recent Labs  Lab 12/21/20 0357 12/22/20 0948 12/22/20 1001  AST 37  --  46*  ALT 13  --  12  ALKPHOS 49  --  47  BILITOT 0.6  --  0.9  PROT 5.7*  --  5.6*  ALBUMIN 2.9* 3.1* 2.9*   No results for input(s): LIPASE, AMYLASE in the last 168 hours. No results for input(s): AMMONIA in the last 168 hours. Coagulation Profile: No results for input(s): INR, PROTIME in the last 168 hours. Cardiac Enzymes: No results for input(s): CKTOTAL, CKMB, CKMBINDEX, TROPONINI in the last 168 hours. BNP (last 3 results) No results for input(s): PROBNP in the last 8760 hours. HbA1C: No results for input(s): HGBA1C in the last 72 hours. CBG: No results for input(s): GLUCAP in the last 168 hours. Lipid Profile: No results for input(s): CHOL, HDL, LDLCALC, TRIG, CHOLHDL, LDLDIRECT in the last 72 hours. Thyroid Function Tests: No results for input(s): TSH, T4TOTAL, FREET4, T3FREE, THYROIDAB in the last 72 hours. Anemia Panel: No results for input(s): VITAMINB12, FOLATE, FERRITIN, TIBC, IRON, RETICCTPCT in the last 72 hours. Urine analysis:    Component Value Date/Time   COLORURINE YELLOW 12/15/2020 1557   APPEARANCEUR CLOUDY (A) 12/15/2020 1557   LABSPEC 1.011 12/15/2020 1557   PHURINE 5.0 12/15/2020 1557   GLUCOSEU NEGATIVE 12/15/2020 1557   HGBUR LARGE (A) 12/15/2020 1557   HGBUR negative 05/12/2008 0957   BILIRUBINUR NEGATIVE 12/15/2020 [place] 12/15/2020  1557   PROTEINUR 30 (A) 12/15/2020 1557   UROBILINOGEN 0.2 07/31/2015 2311   NITRITE NEGATIVE 12/15/2020 1557   LEUKOCYTESUR TRACE (A) 12/15/2020 1557     Jshawn Hurta Evern Core M.D. [facility] 12/22/2020, 3:47 PM   Call night coverage person covering after 7pm

## 2020-12-22 NOTE — Progress Notes (Signed)
ANTICOAGULATION CONSULT NOTE - Follow Up Consult  Pharmacy Consult for IV Heparin Indication: DVT (RLE)  Allergies  Allergen Reactions  . Digoxin Other (See Comments)    gynecomastia   . Phencyclidine Other (See Comments)    PCP derived antibiotic > Insomnia  . Spironolactone Other (See Comments)    Gynecomastia  . Oxybutynin Chloride     Other reaction(s): low BP  . Elemental Sulfur Rash  . Lactose Intolerance (Gi) Diarrhea and Other (See Comments)    cramps  . Penicillins Other (See Comments)    Cramps in stomach Did it involve swelling of the face/tongue/throat, SOB, or low BP? No Did it involve sudden or severe rash/hives, skin peeling, or any reaction on the inside of your mouth or nose? No Did you need to seek medical attention at a hospital or doctor's office? No When did it last happen?unknown If all above answers are "NO", may proceed with cephalosporin use.       Patient Measurements: Height: 6\' 2"  (188 cm) Weight: 103.2 kg (227 lb 8.3 oz) IBW/kg (Calculated) : 82.2 Heparin Dosing Weight: 104 kg  Vital Signs: Temp: 98.5 F (36.9 C) (01/22 0447) Temp Source: Oral (01/22 0447) BP: 123/84 (01/22 0447) Pulse Rate: 88 (01/22 0447)  Labs: Recent Labs    12/20/20 0527 12/20/20 1555 12/21/20 0357 12/21/20 1418 12/22/20 0404  HGB 12.4*  --  13.2  --  13.6  HCT 37.6*  --  40.3  --  42.1  PLT 328  --  355  --  336  HEPARINUNFRC 0.24*   < > 0.29* 0.35 0.69  CREATININE 4.04*  --  4.42*  --   --    < > = values in this interval not displayed.    Estimated Creatinine Clearance: 23.1 mL/min (A) (by C-G formula based on SCr of 4.42 mg/dL (H)).   Assessment: 60 yr old man with medical hx significant for Hodgkin lymphoma, cardiac transplant (on tacrolimus/prednisone), combined systolic/diastolic HF, high-grade T1 bladder cancer with recent hospitalization for AKI complicated by hydronephrosis in setting of new retroperitoneal lymphadenopathy continues on  heparin for acute RLE DVT  - 1/6: IR guided right retroperitoneal lymph node biopsy Retroperitoneal hemorrhage noted on post-biopsy CT - 1/10: LE doppler- acute DVT   Heparin level is therapeutic at top of the range at 0.69.  This is likely attributed to him receiving heparin with dialysis yesterday.  However, given large jump in HL will reduce rate slightly to 1500 units/hr.  Hgb stable 13's. Plts wnl. No bleeding per RN.    Goal of Therapy:  Heparin level 0.3-0.7 units/ml Monitor platelets by anticoagulation protocol: Yes   Plan:   Decrease heparin infusion slightly to 1500 units/hour to ensure patient stays in range Check HL with AM labs Monitor daily CBC, s/sx bleeding  Dimple Nanas, PharmD PGY-1 Acute Care Pharmacy Resident Office: [phone] 12/22/2020 7:45 AM

## 2020-12-23 DIAGNOSIS — C7910 Secondary malignant neoplasm of unspecified urinary organs: Secondary | ICD-10-CM | POA: Diagnosis not present

## 2020-12-23 LAB — CBC
HCT: 39.7 % (ref 39.0–52.0)
Hemoglobin: 13.3 g/dL (ref 13.0–17.0)
MCH: 31.7 pg (ref 26.0–34.0)
MCHC: 33.5 g/dL (ref 30.0–36.0)
MCV: 94.7 fL (ref 80.0–100.0)
Platelets: 363 10*3/uL (ref 150–400)
RBC: 4.19 MIL/uL — ABNORMAL LOW (ref 4.22–5.81)
RDW: 16.1 % — ABNORMAL HIGH (ref 11.5–15.5)
WBC: 20.8 10*3/uL — ABNORMAL HIGH (ref 4.0–10.5)
nRBC: 0.1 % (ref 0.0–0.2)

## 2020-12-23 LAB — RENAL FUNCTION PANEL
Albumin: 2.9 g/dL — ABNORMAL LOW (ref 3.5–5.0)
Anion gap: 14 (ref 5–15)
BUN: 91 mg/dL — ABNORMAL HIGH (ref 6–20)
CO2: 21 mmol/L — ABNORMAL LOW (ref 22–32)
Calcium: 8.8 mg/dL — ABNORMAL LOW (ref 8.9–10.3)
Chloride: 95 mmol/L — ABNORMAL LOW (ref 98–111)
Creatinine, Ser: 3.77 mg/dL — ABNORMAL HIGH (ref 0.61–1.24)
GFR, Estimated: 18 mL/min — ABNORMAL LOW (ref >60)
Glucose, Bld: 121 mg/dL — ABNORMAL HIGH (ref 70–99)
Phosphorus: 5.3 mg/dL — ABNORMAL HIGH (ref 2.5–4.6)
Potassium: 5.4 mmol/L — ABNORMAL HIGH (ref 3.5–5.1)
Sodium: 130 mmol/L — ABNORMAL LOW (ref 135–145)

## 2020-12-23 LAB — HEPARIN LEVEL (UNFRACTIONATED)
Heparin Unfractionated: 1.07 IU/mL — ABNORMAL HIGH (ref 0.30–0.70)
Heparin Unfractionated: 0.85 IU/mL — ABNORMAL HIGH (ref 0.30–0.70)
Heparin Unfractionated: 0.42 IU/mL (ref 0.30–0.70)

## 2020-12-23 MED ORDER — CALCIUM CARBONATE ANTACID 500 MG PO CHEW
1.0000 | CHEWABLE_TABLET | Freq: Two times a day (BID) | ORAL | Status: DC | PRN
  Administered 2020-12-23 – 2020-12-25 (×2): 200 mg via ORAL
  Filled 2020-12-23 (×2): qty 1

## 2020-12-23 MED ORDER — MINERAL OIL RE ENEM: 1.0000 | ENEMA | Freq: Every day | RECTAL | Status: DC | PRN

## 2020-12-23 MED ORDER — ALUM & MAG HYDROXIDE-SIMETH 200-200-20 MG/5ML PO SUSP
30.0000 mL | ORAL | Status: DC | PRN
  Administered 2020-12-23: 30 mL via ORAL
  Filled 2020-12-23 (×2): qty 30

## 2020-12-23 MED ORDER — LACTULOSE 10 GM/15ML PO SOLN
20.0000 g | Freq: Two times a day (BID) | ORAL | Status: DC
  Administered 2020-12-23 – 2020-12-26 (×3): 20 g via ORAL
  Filled 2020-12-23 (×7): qty 30

## 2020-12-23 MED ORDER — BISACODYL 10 MG RE SUPP
10.0000 mg | Freq: Once | RECTAL | Status: AC
  Administered 2020-12-23: 10 mg via RECTAL
  Filled 2020-12-23: qty 1

## 2020-12-23 MED ORDER — SODIUM ZIRCONIUM CYCLOSILICATE 10 G PO PACK
10.0000 g | PACK | Freq: Once | ORAL | Status: AC
  Administered 2020-12-23: 10 g via ORAL
  Filled 2020-12-23: qty 1

## 2020-12-23 MED ORDER — CHLORHEXIDINE GLUCONATE CLOTH 2 % EX PADS
6.0000 | MEDICATED_PAD | Freq: Every day | CUTANEOUS | Status: DC
  Administered 2020-12-24 – 2020-12-25 (×2): 6 via TOPICAL

## 2020-12-23 NOTE — Progress Notes (Signed)
ANTICOAGULATION CONSULT NOTE - Follow Up Consult  Pharmacy Consult for heparin Indication: DVT  Labs: Recent Labs    12/21/20 0357 12/21/20 1418 12/22/20 0404 12/22/20 0948 12/23/20 0243 12/23/20 1328 12/23/20 2238  HGB 13.2  --  13.6  --  13.3  --   --   HCT 40.3  --  42.1  --  39.7  --   --   PLT 355  --  336  --  363  --   --   HEPARINUNFRC 0.29*   < > 0.69  --  1.07* 0.85* 0.42  CREATININE 4.42*  --   --  3.46* 3.77*  --   --    < > = values in this interval not displayed.    Assessment: 60 yo male on heparin drip for RLE DVT.  HL 0.42 this evening  Goal of Therapy:  Heparin level 0.3-0.7 units/ml  Monitor platelets by anticoagulation protocol: yes   Plan:  Cont heparin drip at 1000 units/hour Follow up daily HL and CBC  Thanks for allowing pharmacy to be a part of this patient's care.  Excell Seltzer, PharmD Clinical Pharmacist 12/23/2020 11:29 PM

## 2020-12-23 NOTE — Progress Notes (Signed)
[facility] Progress Note     Assessment/ Plan:   1. Dialysis Dependent AKI on CKD 3a - b/l creat 1.2- 1.4 from oct 2021.   Renal function worsening over last few weeks. UA +wbc's and rbc's, no sig proteinuria. Imaging shows stable R ureteral stent and stable R collecting system dilatation. Has new significant leg edema. ECHO showed normal EF.   Creat 1.8 on admission > up to 2.3 then 2.8 after lasix tried, then 3.1 >  3.2 > 3.4 > up to 4.5 -- HD.  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   IR placed [facility] 1/17 R IJ, HD #1 1/17  HD #3 1/21: 3hr, intra-dialytic hypotension therefore order midorine 10 mg and albumin 25 g. UF as tolerated.  Plan for next HD on 1/24, ordered.  2. Hyperkalemia -K5.4 today therefore ordered a dose of Lokelma. 3. Hyponatremia -  Inability to excrete free water 2/2 low GFR. Fluid restriction, will correct with HD. 4. High-grade bladder cancer- recent dx , treated w/ BCG in Aug 2021, sp TURBT x 2 Aug and Sept. New RP adenopathy w/ R hydro in Dec 2021 > cysto and R ureteral stent per urology. Biopsy RP mass done last week showed poorly diff urothelial carcinoma, molecular markers pending. Onc consulting w/ [facility].  Has started CTX with Dr. Irene Limbo 5. Volume excess - increase UF as able 6. H/o Hodgkin's lymphoma - remote 7. H/o cardiac transplant - taking prograf 1.5 mg /d and pred 5 /d per CV 8. Acute RLE DVT - per primary team. Positive for superficial venous thrombosis/thrombophlebitis of the mid right leg greater saphenous vein but neg for DVT. 9. Chronic diast CHF - had prior LVEF 10% but now has corrected to 60-65%   Subjective:    No new event.  Denies nausea vomiting chest pain shortness of breath.  Plan for dialysis tomorrow.   Objective:   BP 121/79 (BP Location: Right Arm)   Pulse 86   Temp 98.3 F (36.8 C) (Oral)   Resp 18   Ht 6\' 2"  (1.88 m)   Wt 103.2 kg   SpO2  96%   BMI 29.21 kg/m   Intake/Output Summary (Last 24 hours) at 12/23/2020 1148 Last data filed at 12/23/2020 0830 Gross per 24 hour  Intake 495 ml  Output 570 ml  Net -75 ml   Weight change:   Physical Exam: Genalert, NAD No jvd or bruits Chest clear bilatto bases RRR no MRG Abdprotuberant, no ascites by exam, no hsm, +BS Ext3+ pitting bilat LE from feet to hips to abd/chest wall Neuro is alert, Ox 3 , nf TDC site clean.   Imaging: No results found.  Labs: BMET Recent Labs  Lab 12/17/20 0319 12/18/20 0409 12/19/20 0720 12/20/20 0527 12/21/20 0357 12/22/20 0948 12/23/20 0243  NA 122* 125* 125* 128* 127* 131* 130*  K 5.9* 5.2* 5.4* 4.8 5.0 5.3* 5.4*  CL 87* 89* 90* 93* 92* 96* 95*  CO2 18* 21* 20* 21* 21* 20* 21*  GLUCOSE 122* 105* 118* 127* 110* 101* 121*  BUN 120* 100* 117* 84* 104* 80* 91*  CREATININE 5.26* 4.56* 4.78* 4.04* 4.42* 3.46* 3.77*  CALCIUM 8.6* 8.6* 8.5* 8.4* 8.6* 8.8* 8.8*  PHOS  --   --   --   --   --  5.5* 5.3*   CBC Recent Labs  Lab 12/20/20 0527 12/21/20 0357 12/22/20 0404 12/23/20 0243  WBC 25.7* 25.4* 22.1* 20.8*  NEUTROABS  --  22.3*  --   --   HGB 12.4* 13.2 13.6 13.3  HCT 37.6* 40.3 42.1 39.7  MCV 94.5 95.5 96.6 94.7  PLT 328 355 336 363    Medications:    . allopurinol  200 mg Oral Daily  . artificial tears  1 application Both Eyes QHS  . azelastine  1 spray Each Nare Daily   And  . fluticasone  1 spray Each Nare Daily  . cetirizine  10 mg Oral QHS  . Chlorhexidine Gluconate Cloth  6 each Topical Q0600  . dronabinol  2.5 mg Oral BID AC  . fentaNYL  1 patch Transdermal Q72H  . lactulose  10 g Oral BID  . levothyroxine  125 mcg Oral Daily  . metoCLOPramide  5 mg Oral TID AC & HS  . nystatin  5 mL Oral QID  . polyvinyl alcohol  1 drop Both Eyes TID  . [START ON 12/25/2020] predniSONE  10 mg Oral Once  . [START ON 12/24/2020] predniSONE  15 mg Oral Once  . [START ON 12/26/2020] predniSONE  5 mg Oral Q breakfast  .  senna-docusate  3 tablet Oral BID  . Tacrolimus ER  1.5 mg Oral Daily  . traZODone  100 mg Oral QHS      Dron Tanna Furry, MD  12/23/2020, 11:48 AM

## 2020-12-23 NOTE — Progress Notes (Signed)
Patient ID: Glenn Gonzalez, male   DOB: 1961-02-09, 60 y.o.   MRN: [id number]   Reviewed medical records  This NP visited patient at the bedside as a follow up for palliative medicine needs and emotional support. Initial PMT consult on 12-09-20  Glenn Moncus Duehringis a 60 y.o.year old malewith medical history significant for Hodgkin lymphoma/remote, cardiac transplant on tacrolimus and prednisone, chronic combined systolic/diastolic CHF, GERD, high-grade T1 bladder cancer with recent hospitalization from 94/76-5/4 for AKI complicated by hydronephrosis in the setting of new retroperitoneal lymphadenopathy. Patient underwent stent placement by urology and was treated for postoperative multifocal pneumonia.  He presented from urology office with reports of increased back pain not well controlled on oral pain medication as well as generalized fatigue, poor appetite. He was brought in under observation due to concern for back pain in the setting of known retroperitoneal lymphadenopathy.  Hospital course complicated by acute hyponatremia in the setting of poor appetite requiring IV fluids and persistent leukocytosis. CT abdomen imaging consistent with retroperitoneal adenopathy and dilation of right renal pelvis and collecting systems concerning for urothelial neoplasm with extensive metastatic process. Found to have acute right lower extremity DVT on venous duplex started on heparin on 1/10.Has opted for dialysis while awaiting [facility] response.  1/17: Tunneled dialysis cath placed by IR, initiated hemodialysis, first 1/17, tolerated 1/18: second hemodialysis planned 1/21: Chemotherapy, second planned today 1/21, undergoing HD  Complex serious medical situation with overall poor prognosis.   Patient faces treatment option decisions, advanced directive decisions and anticipatory care needs.  Created space and opportunity for patient to explore his thoughts and feelings regarding his current medical  situation.  His main concern is if he will be able to continue treatments specific to his cancer diagnosis.  Therapeutic listening and emotional support.  Education offered on the  importance of continued conversation with his family and the medical providers regarding overall plan of care and treatment options,  ensuring decisions are within the context of the patients values and GOCs.  Questions and concerns addressed     Total time spent on the unit was 15 minutes  Greater than 50% of the time was spent in counseling and coordination of care  Wadie Lessen NP  Palliative Medicine Team Team Phone # [phone] Pager [phone]

## 2020-12-23 NOTE — Progress Notes (Signed)
ANTICOAGULATION CONSULT NOTE - Follow Up Consult  Pharmacy Consult for heparin Indication: DVT  Labs: Recent Labs    12/20/20 0527 12/20/20 1555 12/21/20 0357 12/21/20 1418 12/22/20 0404 12/22/20 0948 12/23/20 0243  HGB 12.4*  --  13.2  --  13.6  --  13.3  HCT 37.6*  --  40.3  --  42.1  --  39.7  PLT 328  --  355  --  336  --  363  HEPARINUNFRC 0.24*   < > 0.29* 0.35 0.69  --  1.07*  CREATININE 4.04*  --  4.42*  --   --  3.46*  --    < > = values in this interval not displayed.    Assessment: 59yo male supratherapeutic on heparin with higher heparin level despite decreased rate; no gtt issues or signs of bleeding per RN.  Goal of Therapy:  Heparin level 0.3-0.7 units/ml   Plan:  Will ecrease heparin gtt by 3 units/kg/hr to 1200 units/hr and check level in 8 hours.    Wynona Neat, PharmD, BCPS  12/23/2020,5:18 AM

## 2020-12-23 NOTE — Progress Notes (Addendum)
ANTICOAGULATION CONSULT NOTE - Follow Up Consult  Pharmacy Consult for heparin Indication: DVT  Labs: Recent Labs    12/21/20 0357 12/21/20 1418 12/22/20 0404 12/22/20 0948 12/23/20 0243 12/23/20 1328  HGB 13.2  --  13.6  --  13.3  --   HCT 40.3  --  42.1  --  39.7  --   PLT 355  --  336  --  363  --   HEPARINUNFRC 0.29*   < > 0.69  --  1.07* 0.85*  CREATININE 4.42*  --   --  3.46* 3.77*  --    < > = values in this interval not displayed.    Assessment: 60 yo male on heparin drip for RLE DVT.  HL still supratherapeutic at 0.85 despite rate decrease.  Will decrease rate by ~2 units/kg/hr.  CBC stable.  No issues with bleeding per RN  Goal of Therapy:  Heparin level 0.3-0.7 units/ml  Monitor platelets by anticoagulation protocol: yes   Plan:  Reduce heparin drip to 1000 units/hour Follow up 8 hour HL, daily CBC  Dimple Nanas, PharmD PGY-1 Acute Care Pharmacy Resident Office: [phone] 12/23/2020 2:14 PM

## 2020-12-23 NOTE — Progress Notes (Signed)
PROGRESS NOTE    Glenn Gonzalez  MCN:[id number]  DOB: 01/23/1961  DOA: 12/21/2020 PCP: Lajean Manes, MD Outpatient Specialists:   Hospital course:  Tung Pustejovsky Duehringis a 60 y.o.year old malewith medical history significant for Hodgkin lymphoma, cardiac transplant on tacrolimus and prednisone, chronic combined systolic/diastolic CHF, GERD, high-grade T1 bladder cancer with recent hospitalization from 62/94-7/6 for AKI complicated by hydronephrosis in the setting of new retroperitoneal lymphadenopathy. Patient underwent stent placement by urology and was treated for postoperative multifocal pneumonia.  He presented from urology office with reports of increased back pain not well controlled on oral pain medication as well as generalized fatigue, poor appetite. He was brought in under observation due to concern for back pain in the setting of known retroperitoneal lymphadenopathy.  Hospital course complicated by acute hyponatremia in the setting of poor appetite requiring IV fluids and persistent leukocytosis. CT abdomen imaging consistent with retroperitoneal adenopathy and dilation of right renal pelvis and collecting systems concerning for urothelial neoplasm with extensive metastatic process. Found to have acute right lower extremity DVT on venous duplex started on heparin on 1/10.Has opted for dialysis while awaiting [facility] response.  1/17: Tunneled dialysis cath placed by IR, initiated hemodialysis, first 1/17, tolerated 1/18: second hemodialysis planned 1/21: Chemotherapy, second planned today 1/21, undergoing HD   Subjective:  Patient's main concern today is ongoing constipation.  Notes he has been taking his lactulose and is having some bowel movements but he would like to be having more.  Denies abdominal pain per se.   Objective: Vitals:   12/22/20 1803 12/22/20 2022 12/23/20 0426 12/23/20 1200  BP: 102/71 101/69 121/79 99/73  Pulse: 87 82 86 86  Resp: 16 18  14    Temp: 98.6 F (37 C) 98.5 F (36.9 C) 98.3 F (36.8 C) 97.8 F (36.6 C)  TempSrc: Oral Oral Oral   SpO2: 96% 94% 96% 93%  Weight:      Height:        Intake/Output Summary (Last 24 hours) at 12/23/2020 1348 Last data filed at 12/23/2020 0830 Gross per 24 hour  Intake 375 ml  Output 220 ml  Net 155 ml   Filed Weights   12/19/20 0745 12/19/20 1100 12/20/20 2052  Weight: 104.2 kg 103.2 kg 103.2 kg     Exam:  General: Chronically ill-appearing man looking much much older than stated age sitting up in bed in no acute distress. Eyes: sclera anicteric, conjuctiva mild injection bilaterally CVS: S1-S2, regular  Respiratory:  decreased air entry bilaterally secondary to decreased inspiratory effort, rales at bases  GI: NABS, soft, NT  LE: 3+ edema bilaterally Neuro:  grossly nonfocal.  Psych: mood and affect appropriate to situation.   Assessment & Plan:   Unfortunate 60 year old man status post cardiac transplant now with metastatic high-grade bladder CA  Constipation Increase lactulose to 20 mg twice daily from 10 mg twice daily This can be increased as warranted Dulcolax suppository 10 mg x 1 ordered to be given today  Anasarca and electrolyte abnormalities Per hemodialysis and nephrology service  History of cardiac transplant Continue tacrolimus, to keep goal levels between 5-8 Steroid taper has been completed and is presently on prednisone 5 mg daily. Patient is followed by the [facility] transplant team Dr. Kae Heller has been consulted and is providing consultation.   Copied from previous notes: Retroperitoneal adenopathy and perinephric nodularity, high-grade bladder CA, recently diagnosed -Status post IR guided retroperitoneal biopsy, pathology positive for poorly differentiated carcinoma.  Recent hospitalization 12/30-1/3 for AKI  complicated by hydronephrosis in the setting of new retroperitoneal LAD, underwent stent placement. - Urology and oncology following. -  Oncology consulted, sent for molecular testing and hoping to discuss with [facility] oncology regarding patient's options --Startedon palliative chemo 1/14, palliative medicine following -  C1D8 Padcev chemo  On 1/21, next due 1.28   AKI on CKD stage 3a, requiring dialysis, new problem  -Baseline creatinine 1.2-1.4 from 08/2020, 1.8 from last hospitalization -Suspect lymphatic obstruction due to malignancy, low albumin, renal function worsening over the last few weeks.  Nephrology was consulted. -Tunneled HD line was placed on the 1/17 and he has tolerated dialysis for 3 sessions.  Bladder was noted to be distended on 120 he was seen by urology who recommended placement of a Foley if he was unable to void.  Again today patient reports no issues voiding.  Acute right lower extremity DVT -Continue IV heparin, H&H stable -If no further procedures planned will discuss transition to apixaban as his hemoglobin has been stable and if he continues to require dialysis would be unable to transition him to Lovenox.  If he is able to get off dialysis ideally he could be transitioned to Lovenox with monitoring of Xa levels. Discuss with oncology on 1/24.   Elevated ALT- monitor as immunosuppressive medications cleared hepatically   Small retroperitoneal hemorrhage status post biopsy -H&H stable  Small pericardial effusion -Incidentally noted on echocardiogram, without tamponade physiology  Chronic combined systolic and diastolic heart failure, history of cardiac transplant -Patient is status post heart transplant, prior EF 10% - EF has now corrected to 60 to 65% -Volume control with HD, initiated on 1/17 -Dr. Haroldine Laws was was contacted on admission as he has been in contact with transplant team as below.  History of cardiac transplant -Continue tacrolimus, to keep goal levels between 5-8 -Continue dexamethasone, reduced dose to 4 mg daily by Dr. Irene Limbo on 1/18 and okay to wean down to his baseline  prednisone 5 mg daily--per note from Dr. Jeffie Pollock, to taper as quickly as possible which per Dr. Grier Mitts note okay to do so---will schedule taper with pharmacy taper scheduled and ordered--down to 5 mg on Wednesday.  -Cardiology was consulted, per Dr Haroldine Laws, EF is normal, recommended to continue the immunosuppressive regimen and follow the tacrolimus levels. Dr. Haroldine Laws has notified/updated the transplant team at [facility].   Leukocytosis-clinically without infection, will discuss with patient and pharmacy about home trimethoprim which should likely be restarted at this time at appropriate dosing given his kidney disease.  This is improving he has been afebrile without signs of infection at this time.  Suspect this is largely due to his underlying malignancy.  If persists would recommend obtaining blood cultures and possibly a urine culture, not obtained during this admission yet.  Hypothyroidism -Continue Synthroid  Code Status: DNR DVT Prophylaxis: IV heparin Family Communication: Discussed all imaging results, lab results, explained to the patient.  Also provided update to sister on phone with patient at bedside.   DVT prophylaxis: Heparin Code Status: DNR Family Communication: None Disposition Plan:   Patient is from: Home  Anticipated Discharge Location: TBD  Barriers to Discharge: Ongoing need for hemodialysis with anasarca and on palliative chemotherapy  Is patient medically stable for Discharge: No   Procedures:  HD cath 1.17 IR guided biopsy 1/06  Consultants:   Nephrology Oncology Palliative IR   Data Reviewed:  Basic Metabolic Panel: Recent Labs  Lab 12/19/20 0720 12/20/20 0527 12/21/20 0357 12/22/20 0948 12/23/20 0243  NA 125* 128*  127* 131* 130*  K 5.4* 4.8 5.0 5.3* 5.4*  CL 90* 93* 92* 96* 95*  CO2 20* 21* 21* 20* 21*  GLUCOSE 118* 127* 110* 101* 121*  BUN 117* 84* 104* 80* 91*  CREATININE 4.78* 4.04* 4.42* 3.46* 3.77*  CALCIUM 8.5* 8.4* 8.6* 8.8*  8.8*  PHOS  --   --   --  5.5* 5.3*   Liver Function Tests: Recent Labs  Lab 12/21/20 0357 12/22/20 0948 12/22/20 1001 12/23/20 0243  AST 37  --  46*  --   ALT 13  --  12  --   ALKPHOS 49  --  47  --   BILITOT 0.6  --  0.9  --   PROT 5.7*  --  5.6*  --   ALBUMIN 2.9* 3.1* 2.9* 2.9*   No results for input(s): LIPASE, AMYLASE in the last 168 hours. No results for input(s): AMMONIA in the last 168 hours. CBC: Recent Labs  Lab 12/19/20 0720 12/20/20 0527 12/21/20 0357 12/22/20 0404 12/23/20 0243  WBC 26.8* 25.7* 25.4* 22.1* 20.8*  NEUTROABS  --   --  22.3*  --   --   HGB 13.3 12.4* 13.2 13.6 13.3  HCT 38.1* 37.6* 40.3 42.1 39.7  MCV 93.2 94.5 95.5 96.6 94.7  PLT 366 328 355 336 363   Cardiac Enzymes: No results for input(s): CKTOTAL, CKMB, CKMBINDEX, TROPONINI in the last 168 hours. BNP (last 3 results) No results for input(s): PROBNP in the last 8760 hours. CBG: No results for input(s): GLUCAP in the last 168 hours.  No results found for this or any previous visit (from the past 240 hour(s)).    Studies: No results found.   Scheduled Meds: . allopurinol  200 mg Oral Daily  . artificial tears  1 application Both Eyes QHS  . azelastine  1 spray Each Nare Daily   And  . fluticasone  1 spray Each Nare Daily  . cetirizine  10 mg Oral QHS  . [START ON 12/24/2020] Chlorhexidine Gluconate Cloth  6 each Topical Q0600  . dronabinol  2.5 mg Oral BID AC  . fentaNYL  1 patch Transdermal Q72H  . lactulose  10 g Oral BID  . levothyroxine  125 mcg Oral Daily  . metoCLOPramide  5 mg Oral TID AC & HS  . nystatin  5 mL Oral QID  . polyvinyl alcohol  1 drop Both Eyes TID  . [START ON 12/25/2020] predniSONE  10 mg Oral Once  . [START ON 12/24/2020] predniSONE  15 mg Oral Once  . [START ON 12/26/2020] predniSONE  5 mg Oral Q breakfast  . senna-docusate  3 tablet Oral BID  . Tacrolimus ER  1.5 mg Oral Daily  . traZODone  100 mg Oral QHS   Continuous Infusions: . sodium  chloride    . heparin 1,200 Units/hr (12/23/20 0522)    Principal Problem:   Metastatic urothelial carcinoma ([place]) Active Problems:   SYSTOLIC HEART FAILURE, CHRONIC   Acute kidney injury superimposed on CKD (HCC)   Hypothyroidism   Hyponatremia   Back pain   Retroperitoneal lymphadenopathy   Liver lesion   Acute on chronic combined systolic and diastolic CHF (congestive heart failure) (HCC)   Acute DVT of right tibial vein (HCC)   Retroperitoneal hemorrhage   Neoplasm related pain   Encounter for antineoplastic chemotherapy   Abdominal distension     Srobona Derek Jack, [facility]  If 7PM-7AM, please contact night-coverage [email or web address] Password [email or web address] 12/23/2020, 1:48  PM    LOS: 16 days

## 2020-12-24 DIAGNOSIS — C7910 Secondary malignant neoplasm of unspecified urinary organs: Secondary | ICD-10-CM | POA: Diagnosis not present

## 2020-12-24 LAB — RENAL FUNCTION PANEL
Albumin: 2.8 g/dL — ABNORMAL LOW (ref 3.5–5.0)
Anion gap: 14 (ref 5–15)
BUN: 105 mg/dL — ABNORMAL HIGH (ref 6–20)
CO2: 23 mmol/L (ref 22–32)
Calcium: 8.8 mg/dL — ABNORMAL LOW (ref 8.9–10.3)
Chloride: 94 mmol/L — ABNORMAL LOW (ref 98–111)
Creatinine, Ser: 4.14 mg/dL — ABNORMAL HIGH (ref 0.61–1.24)
GFR, Estimated: 16 mL/min — ABNORMAL LOW
Glucose, Bld: 111 mg/dL — ABNORMAL HIGH (ref 70–99)
Phosphorus: 5.6 mg/dL — ABNORMAL HIGH (ref 2.5–4.6)
Potassium: 5.3 mmol/L — ABNORMAL HIGH (ref 3.5–5.1)
Sodium: 131 mmol/L — ABNORMAL LOW (ref 135–145)

## 2020-12-24 LAB — CBC
HCT: 38.7 % — ABNORMAL LOW (ref 39.0–52.0)
Hemoglobin: 12.9 g/dL — ABNORMAL LOW (ref 13.0–17.0)
MCH: 31.6 pg (ref 26.0–34.0)
MCHC: 33.3 g/dL (ref 30.0–36.0)
MCV: 94.9 fL (ref 80.0–100.0)
Platelets: 355 10*3/uL (ref 150–400)
RBC: 4.08 MIL/uL — ABNORMAL LOW (ref 4.22–5.81)
RDW: 16.1 % — ABNORMAL HIGH (ref 11.5–15.5)
WBC: 25.4 10*3/uL — ABNORMAL HIGH (ref 4.0–10.5)
nRBC: 0.1 % (ref 0.0–0.2)

## 2020-12-24 LAB — HEPARIN LEVEL (UNFRACTIONATED): Heparin Unfractionated: 0.37 IU/mL (ref 0.30–0.70)

## 2020-12-24 MED ORDER — ALBUMIN HUMAN 25 % IV SOLN
INTRAVENOUS | Status: AC
  Administered 2020-12-24: 25 g
  Filled 2020-12-24: qty 100

## 2020-12-24 MED ORDER — SODIUM CHLORIDE 0.9 % IV SOLN: 100.0000 mL | INTRAVENOUS | Status: DC | PRN

## 2020-12-24 MED ORDER — HEPARIN SODIUM (PORCINE) 1000 UNIT/ML DIALYSIS: 1000.0000 [IU] | INTRAMUSCULAR | Status: DC | PRN

## 2020-12-24 MED ORDER — ALTEPLASE 2 MG IJ SOLR: 2.0000 mg | Freq: Once | INTRAMUSCULAR | Status: DC | PRN

## 2020-12-24 MED ORDER — PENTAFLUOROPROP-TETRAFLUOROETH EX AERO: 1.0000 "application " | INHALATION_SPRAY | CUTANEOUS | Status: DC | PRN

## 2020-12-24 MED ORDER — LIDOCAINE-PRILOCAINE 2.5-2.5 % EX CREA: 1.0000 "application " | TOPICAL_CREAM | CUTANEOUS | Status: DC | PRN

## 2020-12-24 MED ORDER — LIDOCAINE HCL (PF) 1 % IJ SOLN: 5.0000 mL | INTRAMUSCULAR | Status: DC | PRN

## 2020-12-24 MED ORDER — HEPARIN SODIUM (PORCINE) 1000 UNIT/ML IJ SOLN
INTRAMUSCULAR | Status: AC
  Administered 2020-12-24: 3200 [IU] via INTRAVENOUS_CENTRAL
  Filled 2020-12-24: qty 4

## 2020-12-24 NOTE — Progress Notes (Signed)
ANTICOAGULATION CONSULT NOTE - Follow Up Consult  Pharmacy Consult for heparin Indication: DVT  Labs: Recent Labs    12/22/20 0404 12/22/20 0948 12/23/20 0243 12/23/20 1328 12/23/20 2238 12/24/20 0500 12/24/20 0718  HGB 13.6  --  13.3  --   --   --  12.9*  HCT 42.1  --  39.7  --   --   --  38.7*  PLT 336  --  363  --   --   --  355  HEPARINUNFRC 0.69  --  1.07* 0.85* 0.42 0.37  --   CREATININE  --  3.46* 3.77*  --   --   --  4.14*    Assessment: 60 yo male on heparin drip for RLE DVT.  Heparin level therapeutic today  Goal of Therapy:  Heparin level 0.3-0.7 units/ml  Monitor platelets by anticoagulation protocol: yes   Plan:  Cont heparin drip at 1000 units/hour Follow up daily HL and CBC  Thanks for allowing pharmacy to be a part of this patient's care.  Anette Guarneri, PharmD Clinical Pharmacist 12/24/2020 10:23 AM

## 2020-12-24 NOTE — Progress Notes (Signed)
[facility] Progress Note     Assessment/ Plan:   1. Dialysis Dependent AKI on CKD 3a - b/l creat 1.2- 1.4 from oct 2021.   Renal function worsening over last few weeks. UA +wbc's and rbc's, no sig proteinuria. Imaging shows stable R ureteral stent and stable R collecting system dilatation. Has new significant leg edema. ECHO showed normal EF.   Creat 1.8 on admission > up to 2.3 then 2.8 after lasix tried, then 3.1 >  3.2 > 3.4 > up to 4.5 -- HD.  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   IR placed [facility] 1/17 R IJ, HD #1 1/17  HD #3 1/21: 3hr, intra-dialytic hypotension therefore order midorine 10 mg and albumin 25 g. UF as tolerated.    HD today, again with hypotension.  Midodrine 10 given 6:15am, needed albumin support early in HD.  BP still on low side, lower T; hopefully can facilitate some UF.  2. Hyperkalemia -2K dialysate 3. Hyponatremia -  Inability to excrete free water 2/2 low GFR. Fluid restriction, will correct with HD. 4. High-grade bladder cancer- recent dx , treated w/ BCG in Aug 2021, sp TURBT x 2 Aug and Sept. New RP adenopathy w/ R hydro in Dec 2021 > cysto and R ureteral stent per urology. Biopsy RP mass done last week showed poorly diff urothelial carcinoma, molecular markers pending. Onc consulting w/ [facility].  Has started CTX with Dr. Irene Limbo 5. Volume excess - increase UF as able, using measures to support BP with HD.  6. H/o Hodgkin's lymphoma - remote 7. H/o cardiac transplant - taking prograf 1.5 mg /d and pred 5 /d per CV 8. Acute RLE DVT - per primary team. Positive for superficial venous thrombosis/thrombophlebitis of the mid right leg greater saphenous vein but neg for DVT. 9. Chronic diast CHF - had prior LVEF 10% but now has corrected to 60-65%   Subjective:    No new event.  Denies nausea vomiting chest pain shortness of breath.  Dialysis today.    Objective:   BP  104/69 (BP Location: Right Arm)   Pulse 91   Temp 98.4 F (36.9 C) (Oral)   Resp 18   Ht 6\' 2"  (1.88 m)   Wt (P) 105.4 kg   SpO2 97%   BMI (P) 29.83 kg/m   Intake/Output Summary (Last 24 hours) at 12/24/2020 0751 Last data filed at 12/24/2020 0500 Gross per 24 hour  Intake 845.88 ml  Output 250 ml  Net 595.88 ml   Weight change:   Physical Exam: Genalert, NAD No jvd or bruits Chest clear bilatto bases RRR no MRG Abdprotuberant, no ascites by exam, no hsm, +BS Ext3+ pitting bilat LE from feet to hips to abd/chest wall Neuro is alert, Ox 3 , nf TDC site clean.   Imaging: No results found.  Labs: BMET Recent Labs  Lab 12/18/20 0409 12/19/20 0720 12/20/20 0527 12/21/20 0357 12/22/20 0948 12/23/20 0243  NA 125* 125* 128* 127* 131* 130*  K 5.2* 5.4* 4.8 5.0 5.3* 5.4*  CL 89* 90* 93* 92* 96* 95*  CO2 21* 20* 21* 21* 20* 21*  GLUCOSE 105* 118* 127* 110* 101* 121*  BUN 100* 117* 84* 104* 80* 91*  CREATININE 4.56* 4.78* 4.04* 4.42* 3.46* 3.77*  CALCIUM 8.6* 8.5* 8.4* 8.6* 8.8* 8.8*  PHOS  --   --   --   --  5.5* 5.3*  CBC Recent Labs  Lab 12/20/20 0527 12/21/20 0357 12/22/20 0404 12/23/20 0243  WBC 25.7* 25.4* 22.1* 20.8*  NEUTROABS  --  22.3*  --   --   HGB 12.4* 13.2 13.6 13.3  HCT 37.6* 40.3 42.1 39.7  MCV 94.5 95.5 96.6 94.7  PLT 328 355 336 363    Medications:    . allopurinol  200 mg Oral Daily  . artificial tears  1 application Both Eyes QHS  . azelastine  1 spray Each Nare Daily   And  . fluticasone  1 spray Each Nare Daily  . cetirizine  10 mg Oral QHS  . Chlorhexidine Gluconate Cloth  6 each Topical Q0600  . dronabinol  2.5 mg Oral BID AC  . fentaNYL  1 patch Transdermal Q72H  . lactulose  20 g Oral BID  . levothyroxine  125 mcg Oral Daily  . metoCLOPramide  5 mg Oral TID AC & HS  . nystatin  5 mL Oral QID  . polyvinyl alcohol  1 drop Both Eyes TID  . [START ON 12/25/2020] predniSONE  10 mg Oral Once  . predniSONE  15 mg Oral Once   . [START ON 12/26/2020] predniSONE  5 mg Oral Q breakfast  . senna-docusate  3 tablet Oral BID  . Tacrolimus ER  1.5 mg Oral Daily  . traZODone  100 mg Oral QHS      Justin Mend, MD  12/24/2020, 7:51 AM

## 2020-12-24 NOTE — Progress Notes (Signed)
PROGRESS NOTE    Glenn Gonzalez  QZE:[id number]  DOB: [birth year]  DOA: 12/08/2020 PCP: Lajean Manes, MD Outpatient Specialists:   Hospital course:  Glenn Bordas Duehringis a 60 y.o.year old malewith medical history significant for Hodgkin lymphoma, cardiac transplant on tacrolimus and prednisone, chronic combined systolic/diastolic CHF, GERD, high-grade T1 bladder cancer with recent hospitalization from 22/63-3/3 for AKI complicated by hydronephrosis in the setting of new retroperitoneal lymphadenopathy. Patient underwent stent placement by urology and was treated for postoperative multifocal pneumonia.  He presented from urology office with reports of increased back pain not well controlled on oral pain medication as well as generalized fatigue, poor appetite. He was brought in under observation due to concern for back pain in the setting of known retroperitoneal lymphadenopathy.  Hospital course complicated by acute hyponatremia in the setting of poor appetite requiring IV fluids and persistent leukocytosis. CT abdomen imaging consistent with retroperitoneal adenopathy and dilation of right renal pelvis and collecting systems concerning for urothelial neoplasm with extensive metastatic process. Found to have acute right lower extremity DVT on venous duplex started on heparin on 1/10.Has opted for dialysis while awaiting [facility] response.  1/17: Tunneled dialysis cath placed by IR, initiated hemodialysis, first 1/17, tolerated 1/18: second hemodialysis planned 1/21: Chemotherapy, second planned today 1/21, undergoing HD   Subjective:  Patient seen and examined in dialysis unit.  Slightly tired but does not have any specific complaint.  Objective: Vitals:   12/24/20 0528 12/24/20 0725 12/24/20 1030 12/24/20 1110  BP: 104/69   105/71  Pulse: 91   94  Resp: 18   19  Temp: 98.4 F (36.9 C) 97.8 F (36.6 C) 97.6 F (36.4 C) 97.8 F (36.6 C)  TempSrc: Oral Oral Oral   SpO2:  97% 97% 96% 98%  Weight:  105.4 kg    Height:        Intake/Output Summary (Last 24 hours) at 12/24/2020 1420 Last data filed at 12/24/2020 1300 Gross per 24 hour  Intake 785.88 ml  Output -1 ml  Net 786.88 ml   Filed Weights   12/20/20 2052 12/24/20 0500 12/24/20 0725  Weight: 103.2 kg 105.4 kg 105.4 kg     Exam:  General exam: Appears calm and comfortable but chronically sick looking Respiratory system: Clear to auscultation. Respiratory effort normal. Cardiovascular system: S1 & S2 heard, RRR. No JVD, murmurs, rubs, gallops or clicks.  +3 pitting edema bilateral lower extremity. Gastrointestinal system: Abdomen is nondistended, soft and nontender. No organomegaly or masses felt. Normal bowel sounds heard. Central nervous system: Alert and oriented. No focal neurological deficits. Extremities: Symmetric 5 x 5 power. Skin: No rashes, lesions or ulcers.  Psychiatry: Judgement and insight appear normal. Mood & affect appropriate.   Assessment & Plan:   Unfortunate 60 year old man status post cardiac transplant now with metastatic high-grade bladder CA  Constipation Resolved.  Continue lactulose.  Anasarca and electrolyte abnormalities Per hemodialysis and nephrology service  History of cardiac transplant Continue tacrolimus, to keep goal levels between 5-8 Steroid taper has been completed and is presently on prednisone 5 mg daily. Patient is followed by the [facility] transplant team Dr. Kae Heller has been consulted and is providing consultation.  Copied from previous notes: Retroperitoneal adenopathy and perinephric nodularity, high-grade bladder CA, recently diagnosed -Status post IR guided retroperitoneal biopsy, pathology positive for poorly differentiated carcinoma.  Recent hospitalization 54/56-2/5 for AKI complicated by hydronephrosis in the setting of new retroperitoneal LAD, underwent stent placement. - Urology and oncology Consulted but no recent  notes. --Startedon  palliative chemo 1/14, palliative medicine following  -  C1D8 Padcev chemo  On 1/21, next due 1/28   AKI on CKD stage 3a, requiring dialysis, new problem  -Baseline creatinine 1.2-1.4 from 08/2020, 1.8 from last hospitalization -Suspect lymphatic obstruction due to malignancy, low albumin, renal function worsening over the last few weeks.  Nephrology was consulted. -Tunneled HD line was placed on the 1/17 and he has been started on dialysis. Bladder was noted to be distended on 1/20 he was seen by urology who recommended placement of a Foley if he was unable to void.  Again today patient reports no issues voiding.  Unsure whether this is going to be long-term dialysis for him.  Await nephrology's opinion on that.  Acute right lower extremity DVT -Continue IV heparin, H&H stable -If no further procedures planned will discuss transition to apixaban as his hemoglobin has been stable and if he continues to require dialysis would be unable to transition him to Lovenox.  If he is able to get off dialysis ideally he could be transitioned to Lovenox with monitoring of Xa levels.  I have sent a message to the oncology and waiting for the response on the recommendations.  Elevated ALT- monitor as immunosuppressive medications cleared hepatically   Small retroperitoneal hemorrhage status post biopsy -H&H stable  Small pericardial effusion -Incidentally noted on echocardiogram, without tamponade physiology  Chronic combined systolic and diastolic heart failure, history of cardiac transplant -Patient is status post heart transplant, prior EF 10% - EF has now corrected to 60 to 65% -Volume control with HD, initiated on 1/17 -Dr. Haroldine Laws was contacted on admission as he has been in contact with transplant team as below.  History of cardiac transplant -Continue tacrolimus, to keep goal levels between 5-8 -Continue dexamethasone, reduced dose to 4 mg daily by Dr. Irene Limbo on 1/18 and okay to wean  down to his baseline prednisone 5 mg daily--per note from Dr. Jeffie Pollock, to taper as quickly as possible which per Dr. Grier Mitts note okay to do so---will schedule taper with pharmacy taper scheduled and ordered--down to 5 mg on Wednesday.  -Cardiology was consulted, per Dr Haroldine Laws, EF is normal, recommended to continue the immunosuppressive regimen and follow the tacrolimus levels. Dr. Haroldine Laws has notified/updated the transplant team at [facility].   Leukocytosis-clinically without infection and has been stable.  Could very well be due to steroids and malignancy itself.  Hypothyroidism -Continue Synthroid  Code Status: DNR DVT Prophylaxis: IV heparin Family Communication:  None at bedside.  Discussed in length with patient.  He is alert and oriented and competent. DVT prophylaxis: Heparin Code Status: DNR Family Communication: None Disposition Plan:   Patient is from: Home  Anticipated Discharge Location: TBD  Barriers to Discharge: Ongoing need for hemodialysis with anasarca and on palliative chemotherapy  Is patient medically stable for Discharge: No   Procedures:  HD cath 1.17 IR guided biopsy 1/06  Consultants:   Nephrology Oncology Palliative IR   Data Reviewed:  Basic Metabolic Panel: Recent Labs  Lab 12/20/20 0527 12/21/20 0357 12/22/20 0948 12/23/20 0243 12/24/20 0718  NA 128* 127* 131* 130* 131*  K 4.8 5.0 5.3* 5.4* 5.3*  CL 93* 92* 96* 95* 94*  CO2 21* 21* 20* 21* 23  GLUCOSE 127* 110* 101* 121* 111*  BUN 84* 104* 80* 91* 105*  CREATININE 4.04* 4.42* 3.46* 3.77* 4.14*  CALCIUM 8.4* 8.6* 8.8* 8.8* 8.8*  PHOS  --   --  5.5* 5.3* 5.6*   Liver  Function Tests: Recent Labs  Lab 12/21/20 0357 12/22/20 0948 12/22/20 1001 12/23/20 0243 12/24/20 0718  AST 37  --  46*  --   --   ALT 13  --  12  --   --   ALKPHOS 49  --  47  --   --   BILITOT 0.6  --  0.9  --   --   PROT 5.7*  --  5.6*  --   --   ALBUMIN 2.9* 3.1* 2.9* 2.9* 2.8*   No results for  input(s): LIPASE, AMYLASE in the last 168 hours. No results for input(s): AMMONIA in the last 168 hours. CBC: Recent Labs  Lab 12/20/20 0527 12/21/20 0357 12/22/20 0404 12/23/20 0243 12/24/20 0718  WBC 25.7* 25.4* 22.1* 20.8* 25.4*  NEUTROABS  --  22.3*  --   --   --   HGB 12.4* 13.2 13.6 13.3 12.9*  HCT 37.6* 40.3 42.1 39.7 38.7*  MCV 94.5 95.5 96.6 94.7 94.9  PLT 328 355 336 363 355   Cardiac Enzymes: No results for input(s): CKTOTAL, CKMB, CKMBINDEX, TROPONINI in the last 168 hours. BNP (last 3 results) No results for input(s): PROBNP in the last 8760 hours. CBG: No results for input(s): GLUCAP in the last 168 hours.  No results found for this or any previous visit (from the past 240 hour(s)).    Studies: No results found.   Scheduled Meds: . allopurinol  200 mg Oral Daily  . artificial tears  1 application Both Eyes QHS  . azelastine  1 spray Each Nare Daily   And  . fluticasone  1 spray Each Nare Daily  . cetirizine  10 mg Oral QHS  . Chlorhexidine Gluconate Cloth  6 each Topical Q0600  . dronabinol  2.5 mg Oral BID AC  . fentaNYL  1 patch Transdermal Q72H  . lactulose  20 g Oral BID  . levothyroxine  125 mcg Oral Daily  . metoCLOPramide  5 mg Oral TID AC & HS  . nystatin  5 mL Oral QID  . polyvinyl alcohol  1 drop Both Eyes TID  . [START ON 12/25/2020] predniSONE  10 mg Oral Once  . [START ON 12/26/2020] predniSONE  5 mg Oral Q breakfast  . senna-docusate  3 tablet Oral BID  . Tacrolimus ER  1.5 mg Oral Daily  . traZODone  100 mg Oral QHS   Continuous Infusions: . sodium chloride    . heparin 1,000 Units/hr (12/23/20 2015)    Principal Problem:   Metastatic urothelial carcinoma (HCC) Active Problems:   SYSTOLIC HEART FAILURE, CHRONIC   Acute kidney injury superimposed on CKD (HCC)   Hypothyroidism   Hyponatremia   Back pain   Retroperitoneal lymphadenopathy   Liver lesion   Acute on chronic combined systolic and diastolic CHF (congestive heart  failure) (HCC)   Acute DVT of right tibial vein (HCC)   Retroperitoneal hemorrhage   Neoplasm related pain   Encounter for antineoplastic chemotherapy   Abdominal distension     Darliss Cheney, [facility]  If 7PM-7AM, please contact night-coverage [email or web address] Password [email or web address] 12/24/2020, 2:20 PM    LOS: 17 days

## 2020-12-25 DIAGNOSIS — N179 Acute kidney failure, unspecified: Secondary | ICD-10-CM | POA: Diagnosis not present

## 2020-12-25 DIAGNOSIS — R531 Weakness: Secondary | ICD-10-CM

## 2020-12-25 DIAGNOSIS — G893 Neoplasm related pain (acute) (chronic): Secondary | ICD-10-CM | POA: Diagnosis not present

## 2020-12-25 DIAGNOSIS — K769 Liver disease, unspecified: Secondary | ICD-10-CM | POA: Diagnosis not present

## 2020-12-25 DIAGNOSIS — C7910 Secondary malignant neoplasm of unspecified urinary organs: Secondary | ICD-10-CM | POA: Diagnosis not present

## 2020-12-25 LAB — RENAL FUNCTION PANEL
Albumin: 2.9 g/dL — ABNORMAL LOW (ref 3.5–5.0)
Anion gap: 15 (ref 5–15)
BUN: 74 mg/dL — ABNORMAL HIGH (ref 6–20)
CO2: 20 mmol/L — ABNORMAL LOW (ref 22–32)
Calcium: 8.7 mg/dL — ABNORMAL LOW (ref 8.9–10.3)
Chloride: 94 mmol/L — ABNORMAL LOW (ref 98–111)
Creatinine, Ser: 3.33 mg/dL — ABNORMAL HIGH (ref 0.61–1.24)
GFR, Estimated: 20 mL/min — ABNORMAL LOW (ref >60)
Glucose, Bld: 100 mg/dL — ABNORMAL HIGH (ref 70–99)
Phosphorus: 4.1 mg/dL (ref 2.5–4.6)
Potassium: 4.7 mmol/L (ref 3.5–5.1)
Sodium: 129 mmol/L — ABNORMAL LOW (ref 135–145)

## 2020-12-25 LAB — CBC
HCT: 35.5 % — ABNORMAL LOW (ref 39.0–52.0)
Hemoglobin: 12 g/dL — ABNORMAL LOW (ref 13.0–17.0)
MCH: 32.2 pg (ref 26.0–34.0)
MCHC: 33.8 g/dL (ref 30.0–36.0)
MCV: 95.2 fL (ref 80.0–100.0)
Platelets: 292 10*3/uL (ref 150–400)
RBC: 3.73 MIL/uL — ABNORMAL LOW (ref 4.22–5.81)
RDW: 15.9 % — ABNORMAL HIGH (ref 11.5–15.5)
WBC: 22.9 10*3/uL — ABNORMAL HIGH (ref 4.0–10.5)
nRBC: 0.1 % (ref 0.0–0.2)

## 2020-12-25 LAB — TACROLIMUS LEVEL: Tacrolimus (FK506) - LabCorp: 5.8 ng/mL (ref 2.0–20.0)

## 2020-12-25 LAB — HEPARIN LEVEL (UNFRACTIONATED): Heparin Unfractionated: 0.23 IU/mL — ABNORMAL LOW (ref 0.30–0.70)

## 2020-12-25 MED ORDER — FUROSEMIDE 10 MG/ML IJ SOLN
120.0000 mg | Freq: Two times a day (BID) | INTRAVENOUS | Status: DC
  Administered 2020-12-25 – 2020-12-26 (×3): 120 mg via INTRAVENOUS
  Filled 2020-12-25 (×3): qty 12
  Filled 2020-12-25: qty 10
  Filled 2020-12-25: qty 2

## 2020-12-25 MED ORDER — MIDODRINE HCL 5 MG PO TABS
5.0000 mg | ORAL_TABLET | Freq: Three times a day (TID) | ORAL | Status: DC
  Administered 2020-12-25 – 2020-12-27 (×6): 5 mg via ORAL
  Filled 2020-12-25 (×7): qty 1

## 2020-12-25 NOTE — Progress Notes (Signed)
Bladder scann 388mls not able to void chat with [facility]

## 2020-12-25 NOTE — Plan of Care (Signed)
  Problem: Health Behavior/Discharge Planning: Goal: Ability to manage health-related needs will improve Outcome: Progressing   Problem: Clinical Measurements: Goal: Ability to maintain clinical measurements within normal limits will improve Outcome: Progressing Goal: Will remain free from infection Outcome: Progressing Goal: Diagnostic test results will improve Outcome: Progressing Goal: Respiratory complications will improve Outcome: Progressing   Problem: Activity: Goal: Risk for activity intolerance will decrease Outcome: Progressing   

## 2020-12-25 NOTE — Progress Notes (Signed)
Chat with Pharmacy informed that patient has blood in his urine and multiple clots [facility] also notified and lab was called but no response to call. Arthor Captain LPN

## 2020-12-25 NOTE — Progress Notes (Addendum)
OT Cancellation Note  Patient Details Name: Glenn Gonzalez MRN: [id number] DOB: [birth year]   Cancelled Treatment:    Reason Eval/Treat Not Completed: Pain limiting ability to participate;Other (comment) Pt reporting interest in therapy re-evaluation though reports too much back pain to participate at this time. Requesting administration of dilaudid before OOB attempts. OT to follow-up for eval as time allows.   Re-attempted OT eval around 1335 with wife at bedside. Pt had just received IV pain meds, very lethargic and unable to rouse at all. Hope to attempt OT eval tomorrow but noted planned HD in the AM.   Layla Maw 12/25/2020, 11:18 AM

## 2020-12-25 NOTE — Progress Notes (Signed)
Glenn Gonzalez Progress Note     Assessment/ Plan:   1. Dialysis Dependent AKI on CKD 3a - b/l creat 1.2- 1.4 from oct 2021.   Renal function worsening over last few weeks. UA +wbc's and rbc's, no sig proteinuria. Imaging shows stable R ureteral stent and stable R collecting system dilatation. Has new significant leg edema. ECHO showed normal EF.   Creat 1.8 on admission > up to 2.3 then 2.8 after lasix tried, then 3.1 >  3.2 > 3.4 > up to 4.5 -- HD.  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   IR placed [facility] 1/17 R IJ, HD #1 1/17  HD #3 1/21: 3hr, intra-dialytic hypotension therefore order midorine 10 mg and albumin 25 g. UF as tolerated.  HD #4 1/24 again limited by hypotension limiting UF despite midodrine and albumin support.    He's making decent UOP so I will give BID lasix IV to facilitate volume offloading and continue maneuvers with HD to faciliate UF  2. Hyperkalemia -2K dialysate 3. Hyponatremia -  Inability to excrete free water 2/2 low GFR. Fluid restriction, will correct with HD. 4. High-grade bladder cancer- recent dx , treated w/ BCG in Aug 2021, sp TURBT x 2 Aug and Sept. New RP adenopathy w/ R hydro in Dec 2021 > cysto and R ureteral stent per urology. Biopsy RP mass done last week showed poorly diff urothelial carcinoma, molecular markers pending. Onc consulting w/ [facility].  Has started CTX with Dr. Irene Gonzalez 5. Volume excess - increase UF as able, using measures to support BP with HD.  6. H/o Hodgkin's lymphoma - remote 7. H/o cardiac transplant - taking prograf 1.5 mg /d and pred 5 /d per CV 8. Acute RLE DVT - per primary team. Positive for superficial venous thrombosis/thrombophlebitis of the mid right leg greater saphenous vein but neg for DVT. 9. Chronic diast CHF - had prior LVEF 10% but now has corrected to 60-65%    Subjective:   Dialysis yesterday.  Today back pain and rec'd pain  meds just prior to me going by.  Wife is bedside - she's not aware of any new issues.    Objective:   BP 94/66 (BP Location: Right Arm)   Pulse 92   Temp 98.8 F (37.1 C) (Oral)   Resp 16   Ht 6\' 2"  (1.88 m)   Wt 105.4 kg   SpO2 93%   BMI 29.83 kg/m   Intake/Output Summary (Last 24 hours) at 12/25/2020 1548 Last data filed at 12/25/2020 0600 Gross per 24 hour  Intake 120 ml  Output 1025 ml  Net -905 ml   Weight change: 0 kg  Physical Exam: Genalert, NAD No jvd or bruits Chest clear bilatto bases RRR no MRG Abdprotuberant, no ascites by exam, no hsm, +BS Ext3+ pitting bilat LE from feet to hips to abd/chest wall Neuro is alert, Ox 3 , nf TDC site clean.   Imaging: No results found.  Labs: BMET Recent Labs  Lab 12/19/20 0720 12/20/20 0527 12/21/20 0357 12/22/20 0948 12/23/20 0243 12/24/20 0718 12/25/20 0500  NA 125* 128* 127* 131* 130* 131* 129*  K 5.4* 4.8 5.0 5.3* 5.4* 5.3* 4.7  CL 90* 93* 92* 96* 95* 94* 94*  CO2 20* 21* 21* 20* 21* 23 20*  GLUCOSE 118* 127* 110* 101* 121* 111* 100*  BUN 117* 84* 104* 80* 91* 105* 74*  CREATININE 4.78* 4.04* 4.42*  3.46* 3.77* 4.14* 3.33*  CALCIUM 8.5* 8.4* 8.6* 8.8* 8.8* 8.8* 8.7*  PHOS  --   --   --  5.5* 5.3* 5.6* 4.1   CBC Recent Labs  Lab 12/21/20 0357 12/22/20 0404 12/23/20 0243 12/24/20 0718 12/25/20 0500  WBC 25.4* 22.1* 20.8* 25.4* 22.9*  NEUTROABS 22.3*  --   --   --   --   HGB 13.2 13.6 13.3 12.9* 12.0*  HCT 40.3 42.1 39.7 38.7* 35.5*  MCV 95.5 96.6 94.7 94.9 95.2  PLT 355 336 363 355 292    Medications:    . allopurinol  200 mg Oral Daily  . artificial tears  1 application Both Eyes QHS  . azelastine  1 spray Each Nare Daily   And  . fluticasone  1 spray Each Nare Daily  . cetirizine  10 mg Oral QHS  . Chlorhexidine Gluconate Cloth  6 each Topical Q0600  . dronabinol  2.5 mg Oral BID AC  . fentaNYL  1 patch Transdermal Q72H  . lactulose  20 g Oral BID  . levothyroxine  125 mcg Oral  Daily  . metoCLOPramide  5 mg Oral TID AC & HS  . midodrine  5 mg Oral TID WC  . nystatin  5 mL Oral QID  . polyvinyl alcohol  1 drop Both Eyes TID  . [START ON 12/26/2020] predniSONE  5 mg Oral Q breakfast  . senna-docusate  3 tablet Oral BID  . Tacrolimus ER  1.5 mg Oral Daily  . traZODone  100 mg Oral QHS      Glenn Mend, MD  12/25/2020, 3:48 PM

## 2020-12-25 NOTE — Progress Notes (Addendum)
PROGRESS NOTE  Glenn Gonzalez TKZ:[id number] DOB: Jun 06, 1961 DOA: 12/22/2020 PCP: Lajean Manes, MD  HPI/Recap of past 24 hours: Glenn Gonzalez a 60 y.o.year old malewith medical history significant for Hodgkin lymphoma, cardiac transplant on tacrolimus and prednisone, chronic combined systolic/diastolic CHF, GERD, high-grade T1 bladder cancer with recent hospitalization from 57/32-2/0 for AKI complicated by hydronephrosis in the setting of new retroperitoneal lymphadenopathy. Patient underwent stent placement by urology and was treated for postoperative multifocal pneumonia.  He presented from urology office with reports of increased back pain not well controlled on oral pain medication as well as generalized fatigue, poor appetite. He was brought in under observation due to concern for back pain in the setting of known retroperitoneal lymphadenopathy.  Hospital course complicated by acute hyponatremia in the setting of poor appetite requiring IV fluids and persistent leukocytosis. CT abdomen imaging consistent with retroperitoneal adenopathy and dilation of right renal pelvis and collecting systems concerning for urothelial neoplasm with extensive metastatic process. Found to have acute right lower extremity DVT on venous duplex started on heparin on 1/10.Has opted for dialysis while awaiting [facility] response.  1/17: Tunneled dialysis cath placed by IR, initiated hemodialysis, first 1/17, tolerated well.  HD on 1/18, 1/21 Chemotherapy, second planned 1/21 Undergoing HD  12/25/20: Seen and examined at his bedside.  He is minimally interactive.  He has no new complaints.  Bedside RN reports some hematuria and acute urinary retention requiring in and out cath.  Patient has not given consent for an indwelling Foley catheter.  We will continue to address per protocol.   Assessment/Plan: Principal Problem:   Metastatic urothelial carcinoma (HCC) Active Problems:   SYSTOLIC HEART  FAILURE, CHRONIC   Acute kidney injury superimposed on CKD (HCC)   Hypothyroidism   Hyponatremia   Back pain   Retroperitoneal lymphadenopathy   Liver lesion   Acute on chronic combined systolic and diastolic CHF (congestive heart failure) (HCC)   Acute DVT of right tibial vein (HCC)   Retroperitoneal hemorrhage   Neoplasm related pain   Encounter for antineoplastic chemotherapy   Abdominal distension  Unfortunate 60 year old man status post cardiac transplant now with metastatic high-grade bladder CA  Constipation Resolved.  Continue lactulose.  Anasarca and electrolyte abnormalities Addressed with hemodialysis.  Acute urinary retention Management per protocol  Mild hematuria, acute Unclear if related to in and out cath. Continue to closely monitor in the setting of IV anticoagulation Hemoglobin thus far 12.0 from 12.9. Continue to monitor H&H  History of cardiac transplant Continue tacrolimus, to keep goal levels between 5-8 Steroid taper has been completed and is presently on prednisone 5 mg daily. Patient is followed by the [facility] transplant team Dr. Kae Heller has been consulted and is providing consultation.  Copied from previous notes: Retroperitoneal adenopathy and perinephric nodularity, high-grade bladder CA, recently diagnosed -Status post IR guided retroperitoneal biopsy, pathology positive for poorly differentiated carcinoma. Recent hospitalization 25/42-7/0 for AKI complicated by hydronephrosis in the setting of new retroperitoneal LAD, underwent stent placement. - Urology and oncology Consulted but no recent notes. --Startedon palliative chemo 1/14, palliative medicine following  - C1D8 Padcev chemo On 1/21, next due 1/28  AKI on CKD stage 3a,requiring dialysis, new problem -Baseline creatinine 1.2-1.4 from 08/2020, 1.8 from last hospitalization -Suspect lymphatic obstruction due to malignancy, low albumin, renal function worsening over the last few  weeks. Nephrology was consulted. -Tunneled HD line was placed on the1/17 and he has been started on dialysis.Bladder was noted to be distended on 1/20 he  was seen by urology who recommended placement of a Foley if he was unable to void. Patient has not given consent for insertion of indwelling Foley catheter  Acute right lower extremity DVT -Continue IV heparin and monitor H&H -If no further procedures planned will discuss transition to apixaban.    Elevated ALT-monitor as immunosuppressive medications cleared hepatically   Small retroperitoneal hemorrhage status post biopsy Continue to monitor H&H -Hemoglobin 12.0.  Small pericardial effusion -Incidentally noted on echocardiogram, without tamponade physiology  Chronic combined systolic and diastolic heart failure, history of cardiac transplant -Patient is status post heart transplant, prior EF 10% - EF has now corrected to 60 to 65% -Volume control with HD, initiated on 1/17 -Dr. Rae Lips contacted on admission as he has been in contact with transplant team as below.  Improving leukocytosis likely reactive in the setting of chronic steroid use No evidence of active infective process. WBC is downtrending   Hypothyroidism -Continue Synthroid  Goals of care Patient reports his CODE STATUS to full code on 12/25/2020. Please see chaplain's note.  Moderate protein calorie malnutrition Last albumin 2.9 Moderate muscle mass loss On Dronabinol for appetite stimulant Encourage increase oral protein calorie intake  Physical debility Continue PT OT with assistance and fall precautions PT and OT are following.  Code Status:Full code. DVT Prophylaxis:IV heparin Family Communication: None at bedside.  Discussed in length with patient.  He is alert and oriented and competent.  Disposition Plan:   Patient is from: Home  Anticipated Discharge Location: TBD  Barriers to Discharge: Ongoing need for hemodialysis with  anasarca and on palliative chemotherapy  Is patient medically stable for Discharge: No   Procedures:  HD cath 1.17 IR guided biopsy 1/06  Consultants: Nephrology Oncology Palliative IR    Objective: Vitals:   12/24/20 1741 12/24/20 2221 12/25/20 0400 12/25/20 1200  BP: 93/67 118/69 121/76 94/66  Pulse: 92 95 92   Resp: 18 16 16    Temp: 97.9 F (36.6 C) 97.8 F (36.6 C) 98.8 F (37.1 C)   TempSrc:   Oral   SpO2: 92% 94% 93%   Weight:      Height:        Intake/Output Summary (Last 24 hours) at 12/25/2020 1650 Last data filed at 12/25/2020 0600 Gross per 24 hour  Intake 120 ml  Output 1025 ml  Net -905 ml   Filed Weights   12/20/20 2052 12/24/20 0500 12/24/20 0725  Weight: 103.2 kg 105.4 kg 105.4 kg    Exam:  . General: 60 y.o. year-old male well developed well nourished in no acute distress.  Alert and oriented x3. . Cardiovascular: Regular rate and rhythm with no rubs or gallops.  No thyromegaly or JVD noted.   Marland Kitchen Respiratory: Clear to auscultation with no wheezes or rales. Good inspiratory effort. . Abdomen: Soft nontender nondistended with normal bowel sounds x4 quadrants. . Musculoskeletal: 3+ pitting edema in lower extremities bilaterally.  . Skin: No ulcerative lesions noted or rashes . Psychiatry: Mood is appropriate for condition and setting   Data Reviewed: CBC: Recent Labs  Lab 12/21/20 0357 12/22/20 0404 12/23/20 0243 12/24/20 0718 12/25/20 0500  WBC 25.4* 22.1* 20.8* 25.4* 22.9*  NEUTROABS 22.3*  --   --   --   --   HGB 13.2 13.6 13.3 12.9* 12.0*  HCT 40.3 42.1 39.7 38.7* 35.5*  MCV 95.5 96.6 94.7 94.9 95.2  PLT 355 336 363 355 350   Basic Metabolic Panel: Recent Labs  Lab 12/21/20 0357 12/22/20  3382 12/23/20 0243 12/24/20 0718 12/25/20 0500  NA 127* 131* 130* 131* 129*  K 5.0 5.3* 5.4* 5.3* 4.7  CL 92* 96* 95* 94* 94*  CO2 21* 20* 21* 23 20*  GLUCOSE 110* 101* 121* 111* 100*  BUN 104* 80* 91* 105* 74*  CREATININE 4.42*  3.46* 3.77* 4.14* 3.33*  CALCIUM 8.6* 8.8* 8.8* 8.8* 8.7*  PHOS  --  5.5* 5.3* 5.6* 4.1   GFR: Estimated Creatinine Clearance: 30.9 mL/min (A) (by C-G formula based on SCr of 3.33 mg/dL (H)). Liver Function Tests: Recent Labs  Lab 12/21/20 0357 12/22/20 0948 12/22/20 1001 12/23/20 0243 12/24/20 0718 12/25/20 0500  AST 37  --  46*  --   --   --   ALT 13  --  12  --   --   --   ALKPHOS 49  --  47  --   --   --   BILITOT 0.6  --  0.9  --   --   --   PROT 5.7*  --  5.6*  --   --   --   ALBUMIN 2.9* 3.1* 2.9* 2.9* 2.8* 2.9*   No results for input(s): LIPASE, AMYLASE in the last 168 hours. No results for input(s): AMMONIA in the last 168 hours. Coagulation Profile: No results for input(s): INR, PROTIME in the last 168 hours. Cardiac Enzymes: No results for input(s): CKTOTAL, CKMB, CKMBINDEX, TROPONINI in the last 168 hours. BNP (last 3 results) No results for input(s): PROBNP in the last 8760 hours. HbA1C: No results for input(s): HGBA1C in the last 72 hours. CBG: No results for input(s): GLUCAP in the last 168 hours. Lipid Profile: No results for input(s): CHOL, HDL, LDLCALC, TRIG, CHOLHDL, LDLDIRECT in the last 72 hours. Thyroid Function Tests: No results for input(s): TSH, T4TOTAL, FREET4, T3FREE, THYROIDAB in the last 72 hours. Anemia Panel: No results for input(s): VITAMINB12, FOLATE, FERRITIN, TIBC, IRON, RETICCTPCT in the last 72 hours. Urine analysis:    Component Value Date/Time   COLORURINE YELLOW 12/15/2020 1557   APPEARANCEUR CLOUDY (A) 12/15/2020 1557   LABSPEC 1.011 12/15/2020 1557   PHURINE 5.0 12/15/2020 1557   GLUCOSEU NEGATIVE 12/15/2020 1557   HGBUR LARGE (A) 12/15/2020 1557   HGBUR negative 05/12/2008 0957   BILIRUBINUR NEGATIVE 12/15/2020 1557   KETONESUR NEGATIVE 12/15/2020 1557   PROTEINUR 30 (A) 12/15/2020 1557   UROBILINOGEN 0.2 07/31/2015 2311   NITRITE NEGATIVE 12/15/2020 1557   LEUKOCYTESUR TRACE (A) 12/15/2020 1557   Sepsis  Labs: @LABRCNTIP (procalcitonin:4,lacticidven:4)  )No results found for this or any previous visit (from the past 240 hour(s)).    Studies: No results found.  Scheduled Meds: . allopurinol  200 mg Oral Daily  . artificial tears  1 application Both Eyes QHS  . azelastine  1 spray Each Nare Daily   And  . fluticasone  1 spray Each Nare Daily  . cetirizine  10 mg Oral QHS  . Chlorhexidine Gluconate Cloth  6 each Topical Q0600  . dronabinol  2.5 mg Oral BID AC  . fentaNYL  1 patch Transdermal Q72H  . lactulose  20 g Oral BID  . levothyroxine  125 mcg Oral Daily  . metoCLOPramide  5 mg Oral TID AC & HS  . midodrine  5 mg Oral TID WC  . nystatin  5 mL Oral QID  . polyvinyl alcohol  1 drop Both Eyes TID  . [START ON 12/26/2020] predniSONE  5 mg Oral Q breakfast  . senna-docusate  3 tablet Oral BID  . Tacrolimus ER  1.5 mg Oral Daily  . traZODone  100 mg Oral QHS    Continuous Infusions: . sodium chloride    . furosemide    . heparin 1,150 Units/hr (12/25/20 0917)     LOS: 18 days     Kayleen Memos, MD [facility] Pager [phone]  If 7PM-7AM, please contact night-coverage [email or web address] Password [facility] 12/25/2020, 4:50 PM

## 2020-12-25 NOTE — Progress Notes (Signed)
Telephone call to lab to to get labs drawn now due to bleeding

## 2020-12-25 NOTE — Progress Notes (Signed)
This RN was made aware by Lillia Mountain that patient did not want to be a DNR anymore.  MD was contacted and updated on the patients change in code status.    Aurther Loft, RN

## 2020-12-25 NOTE — Progress Notes (Signed)
This chaplain responded to a spiritual care consult for completing the Pt. Advance Directive. The Pt. wife-Lisa is bedside with the incomplete AD.   The Pt. is awake and appropriately responds to the chaplain's questions during periods of closing his eyes. Advance Directive:  HCPOA and Living Will education is complete. Lattie Haw and the Pt. will review the document and be prepared for a notary visit on Friday.   The chaplain understands the Pt. no longer wants to have code status as DNR. The chaplain informed the Pt. RN-Andrea of the Pt. statement, "cut this bracelet off." The chaplain understands the RN secure chatted Dr. Nevada Crane.   The chaplain offered F/U spiritual care for the Pt. and Lisa.

## 2020-12-25 NOTE — Plan of Care (Signed)
  Problem: Health Behavior/Discharge Planning: Goal: Ability to manage health-related needs will improve Outcome: Progressing   Problem: Clinical Measurements: Goal: Ability to maintain clinical measurements within normal limits will improve Outcome: Progressing Goal: Will remain free from infection Outcome: Progressing Goal: Diagnostic test results will improve Outcome: Progressing Goal: Respiratory complications will improve Outcome: Progressing   Problem: Nutrition: Goal: Adequate nutrition will be maintained Outcome: Progressing   Problem: Activity: Goal: Risk for activity intolerance will decrease Outcome: Progressing   Problem: Coping: Goal: Level of anxiety will decrease Outcome: Progressing   Problem: Elimination: Goal: Will not experience complications related to bowel motility Outcome: Progressing   Problem: Pain Managment: Goal: General experience of comfort will improve Outcome: Progressing   Problem: Safety: Goal: Ability to remain free from injury will improve Outcome: Progressing   Problem: Skin Integrity: Goal: Risk for impaired skin integrity will decrease Outcome: Progressing   Problem: Elimination: Goal: Will not experience complications related to urinary retention Outcome: Progressing   Problem: Education: Goal: Knowledge of the prescribed therapeutic regimen will improve Outcome: Progressing   Problem: Activity: Goal: Ability to implement measures to reduce episodes of fatigue will improve Outcome: Progressing   Problem: Bowel/Gastric: Goal: Will not experience complications related to bowel motility Outcome: Progressing   Problem: Coping: Goal: Ability to identify and develop effective coping behavior will improve Outcome: Progressing

## 2020-12-25 NOTE — Progress Notes (Signed)
ANTICOAGULATION CONSULT NOTE - Follow Up Consult  Pharmacy Consult for heparin Indication: DVT  Labs: Recent Labs    12/23/20 0243 12/23/20 1328 12/23/20 2238 12/24/20 0500 12/24/20 0718 12/25/20 0500  HGB 13.3  --   --   --  12.9* 12.0*  HCT 39.7  --   --   --  38.7* 35.5*  PLT 363  --   --   --  355 292  HEPARINUNFRC 1.07*   < > 0.42 0.37  --  0.23*  CREATININE 3.77*  --   --   --  4.14* 3.33*   < > = values in this interval not displayed.    Assessment: 60 yo male on heparin drip for RLE DVT.  Heparin level 0.23 today  RN reports blood clots and blood in urine, HgB stable  Goal of Therapy:  Heparin level 0.3-0.7 units/ml  Monitor platelets by anticoagulation protocol: yes   Plan:  Increase heparin slightly to 1150 units / hr Follow up daily HL and CBC  Thanks for allowing pharmacy to be a part of this patient's care.  Anette Guarneri, PharmD Clinical Pharmacist 12/25/2020 9:00 AM

## 2020-12-25 NOTE — Progress Notes (Signed)
Patient ID: Glenn Gonzalez, male   DOB: 1961/03/23, 60 y.o.   MRN: [id number]   Reviewed medical records  This NP visited patient at the bedside as a follow up for palliative medicine needs and emotional support. Initial PMT consult on 12-09-20  Glenn Ansell Duehringis a 60 y.o.year old malewith medical history significant for Hodgkin lymphoma/remote, cardiac transplant on tacrolimus and prednisone, chronic combined systolic/diastolic CHF, GERD, high-grade T1 bladder cancer with recent hospitalization from 37/34-2/8 for AKI complicated by hydronephrosis in the setting of new retroperitoneal lymphadenopathy. Patient underwent stent placement by urology and was treated for postoperative multifocal pneumonia.  He presented from urology office with reports of increased back pain not well controlled on oral pain medication as well as generalized fatigue, poor appetite. He was brought in under observation due to concern for back pain in the setting of known retroperitoneal lymphadenopathy.  Hospital course complicated by acute hyponatremia in the setting of poor appetite requiring IV fluids and persistent leukocytosis. CT abdomen imaging consistent with retroperitoneal adenopathy and dilation of right renal pelvis and collecting systems concerning for urothelial neoplasm with extensive metastatic process. Found to have acute right lower extremity DVT on venous duplex started on heparin on 1/10.Has opted for dialysis while awaiting [facility] response.  1/17: Tunneled dialysis cath placed by IR, initiated hemodialysis, first 1/17, tolerated 1/18: second hemodialysis planned 1/21: Chemotherapy, second planned today 1/21, undergoing HD   Complex serious medical situation with overall poor prognosis.   Patient  is weak and lethargic, he denies need for pain medication adjustments.  "Things are OK"  Patient faces treatment option decisions, advanced directive decisions and anticipatory care needs.  Created  space and opportunity for patient to explore his thoughts and feelings regarding his current medical situation.  His main concern is if he will be able to continue treatments specific to his cancer diagnosis.  Therapeutic listening and emotional support.  Continued conversation regarding importance of advanced care planning specific to his medical situation.  I see AD blue book at bedside.  He tells me he "is trying to think about it"   Education offered on the  importance of continued conversation with his family and the medical providers regarding overall plan of care and treatment options,  ensuring decisions are within the context of the patients values and GOCs.  Offered to met with husband and his wife and he verbalizes interest.  I will reach out to his wife and plan a meeting  Questions and concerns addressed   Emotional support offered  Spoke to wife by telephone.   Plan is to met tomorrow at 11:00 am   Total time spent on the unit was 35 minutes  Greater than 50% of the time was spent in counseling and coordination of care  Wadie Lessen NP  Palliative Medicine Team Team Phone # [phone] Pager [phone]

## 2020-12-25 NOTE — Plan of Care (Signed)
  Problem: Health Behavior/Discharge Planning: Goal: Ability to manage health-related needs will improve Outcome: Progressing   Problem: Coping: Goal: Level of anxiety will decrease Outcome: Progressing   Problem: Elimination: Goal: Will not experience complications related to bowel motility Outcome: Progressing   Problem: Pain Managment: Goal: General experience of comfort will improve Outcome: Progressing

## 2020-12-25 NOTE — Plan of Care (Signed)
  Problem: Clinical Measurements: Goal: Ability to maintain clinical measurements within normal limits will improve 12/25/2020 0725 by Dorena Cookey, LPN Outcome: Progressing 12/25/2020 0709 by Dorena Cookey, LPN Outcome: Progressing Goal: Will remain free from infection 12/25/2020 0725 by Dorena Cookey, LPN Outcome: Progressing 12/25/2020 0709 by Dorena Cookey, LPN Outcome: Progressing Goal: Diagnostic test results will improve 12/25/2020 0725 by Dorena Cookey, LPN Outcome: Progressing 12/25/2020 0709 by Dorena Cookey, LPN Outcome: Progressing Goal: Respiratory complications will improve 12/25/2020 0725 by Dorena Cookey, LPN Outcome: Progressing 12/25/2020 0709 by Dorena Cookey, LPN Outcome: Progressing   Problem: Activity: Goal: Risk for activity intolerance will decrease 12/25/2020 0725 by Dorena Cookey, LPN Outcome: Progressing 12/25/2020 0709 by Dorena Cookey, LPN Outcome: Progressing   Problem: Nutrition: Goal: Adequate nutrition will be maintained 12/25/2020 0725 by Dorena Cookey, LPN Outcome: Progressing 12/25/2020 0709 by Dorena Cookey, LPN Outcome: Progressing   Problem: Coping: Goal: Level of anxiety will decrease 12/25/2020 0725 by Dorena Cookey, LPN Outcome: Progressing 12/25/2020 0709 by Dorena Cookey, LPN Outcome: Progressing   Problem: Elimination: Goal: Will not experience complications related to bowel motility 12/25/2020 0725 by Dorena Cookey, LPN Outcome: Progressing 12/25/2020 0709 by Dorena Cookey, LPN Outcome: Progressing   Problem: Pain Managment: Goal: General experience of comfort will improve 12/25/2020 0725 by Dorena Cookey, LPN Outcome: Progressing 12/25/2020 0709 by Dorena Cookey, LPN Outcome: Progressing

## 2020-12-25 NOTE — Progress Notes (Signed)
PT Cancellation Note  Patient Details Name: Glenn Gonzalez MRN: [id number] DOB: 1961/04/27   Cancelled Treatment:    Reason Eval/Treat Not Completed: Fatigue/lethargy limiting ability to participate attempted in the morning and patient politely declined due to high pain levels; reattempted in the afternoon, however he had recently received pain medication and was too lethargic to participate. Will continue to follow and initiation re-consult as able.    Windell Norfolk, DPT, PN1   Supplemental Physical Therapist [facility]    Pager [phone] Acute Rehab Office [phone]

## 2020-12-26 ENCOUNTER — Encounter (HOSPITAL_COMMUNITY): Payer: Self-pay | Admitting: Hematology

## 2020-12-26 DIAGNOSIS — C7910 Secondary malignant neoplasm of unspecified urinary organs: Secondary | ICD-10-CM | POA: Diagnosis not present

## 2020-12-26 DIAGNOSIS — G893 Neoplasm related pain (acute) (chronic): Secondary | ICD-10-CM | POA: Diagnosis not present

## 2020-12-26 DIAGNOSIS — K769 Liver disease, unspecified: Secondary | ICD-10-CM | POA: Diagnosis not present

## 2020-12-26 DIAGNOSIS — N179 Acute kidney failure, unspecified: Secondary | ICD-10-CM | POA: Diagnosis not present

## 2020-12-26 LAB — COMPREHENSIVE METABOLIC PANEL
ALT: 19 U/L (ref 0–44)
AST: 60 U/L — ABNORMAL HIGH (ref 15–41)
Albumin: 3.1 g/dL — ABNORMAL LOW (ref 3.5–5.0)
Alkaline Phosphatase: 58 U/L (ref 38–126)
Anion gap: 17 — ABNORMAL HIGH (ref 5–15)
BUN: 87 mg/dL — ABNORMAL HIGH (ref 6–20)
CO2: 19 mmol/L — ABNORMAL LOW (ref 22–32)
Calcium: 8.9 mg/dL (ref 8.9–10.3)
Chloride: 96 mmol/L — ABNORMAL LOW (ref 98–111)
Creatinine, Ser: 3.83 mg/dL — ABNORMAL HIGH (ref 0.61–1.24)
GFR, Estimated: 17 mL/min — ABNORMAL LOW (ref >60)
Glucose, Bld: 94 mg/dL (ref 70–99)
Potassium: 5.3 mmol/L — ABNORMAL HIGH (ref 3.5–5.1)
Sodium: 132 mmol/L — ABNORMAL LOW (ref 135–145)
Total Bilirubin: 0.9 mg/dL (ref 0.3–1.2)
Total Protein: 5.7 g/dL — ABNORMAL LOW (ref 6.5–8.1)

## 2020-12-26 LAB — CBC
HCT: 42.2 % (ref 39.0–52.0)
Hemoglobin: 13.4 g/dL (ref 13.0–17.0)
MCH: 31.2 pg (ref 26.0–34.0)
MCHC: 31.8 g/dL (ref 30.0–36.0)
MCV: 98.4 fL (ref 80.0–100.0)
Platelets: 251 10*3/uL (ref 150–400)
RBC: 4.29 MIL/uL (ref 4.22–5.81)
RDW: 16.2 % — ABNORMAL HIGH (ref 11.5–15.5)
WBC: 20.2 10*3/uL — ABNORMAL HIGH (ref 4.0–10.5)
nRBC: 0.1 % (ref 0.0–0.2)

## 2020-12-26 LAB — HEPARIN LEVEL (UNFRACTIONATED)
Heparin Unfractionated: 0.24 IU/mL — ABNORMAL LOW (ref 0.30–0.70)
Heparin Unfractionated: 0.55 IU/mL (ref 0.30–0.70)

## 2020-12-26 MED ORDER — CHLORHEXIDINE GLUCONATE CLOTH 2 % EX PADS
6.0000 | MEDICATED_PAD | Freq: Every day | CUTANEOUS | Status: DC
  Administered 2020-12-27 – 2021-01-01 (×3): 6 via TOPICAL

## 2020-12-26 NOTE — Progress Notes (Signed)
ANTICOAGULATION CONSULT NOTE - Follow Up Consult  Pharmacy Consult for heparin Indication: DVT  Labs: Recent Labs    12/24/20 0500 12/24/20 0718 12/24/20 0718 12/25/20 0500 12/26/20 0429  HGB  --  12.9*   < > 12.0* 13.4  HCT  --  38.7*  --  35.5* 42.2  PLT  --  355  --  292 251  HEPARINUNFRC 0.37  --   --  0.23* 0.24*  CREATININE  --  4.14*  --  3.33* 3.83*   < > = values in this interval not displayed.    Assessment: 60 yo male on heparin drip for RLE DVT.  Heparin level 0.24 today  RN reports blood clots and blood in urine, HgB stable  Goal of Therapy:  Heparin level 0.3-0.7 units/ml  Monitor platelets by anticoagulation protocol: yes   Plan:  Increase heparin to 1350 units / hr Heparin level in 8 hours Daily heparin level, CBC  Anette Guarneri, PharmD Clinical Pharmacist 12/26/2020 8:58 AM

## 2020-12-26 NOTE — Progress Notes (Signed)
PROGRESS NOTE    Glenn Gonzalez  XNA:[id number]  DOB: 04-18-1961  DOA: 12/30/2020 PCP: Lajean Manes, MD Outpatient Specialists:   Hospital course:  Kaizen Ibsen Duehringis a 60 y.o.year old malewith medical history significant for Hodgkin lymphoma, cardiac transplant on tacrolimus and prednisone, chronic combined systolic/diastolic CHF, GERD, high-grade T1 bladder cancer with recent hospitalization from 54/27-0/6 for AKI complicated by hydronephrosis in the setting of new retroperitoneal lymphadenopathy. Patient underwent stent placement by urology and was treated for postoperative multifocal pneumonia.  He presented from urology office with reports of increased back pain not well controlled on oral pain medication as well as generalized fatigue, poor appetite. He was brought in under observation due to concern for back pain in the setting of known retroperitoneal lymphadenopathy.  Hospital course complicated by acute hyponatremia in the setting of poor appetite requiring IV fluids and persistent leukocytosis. CT abdomen imaging consistent with retroperitoneal adenopathy and dilation of right renal pelvis and collecting systems concerning for urothelial neoplasm with extensive metastatic process. Found to have acute right lower extremity DVT on venous duplex started on heparin on 1/10.Has opted for dialysis while awaiting [facility] response.  1/17: Tunneled dialysis cath placed by IR, initiated hemodialysis, first 1/17, tolerated 1/18: second hemodialysis planned 1/21: Chemotherapy, second planned today 1/21, undergoing HD   Subjective: Patient seen and examined.  He is alert and oriented.  He has no complaint. Objective: Vitals:   12/25/20 2017 12/26/20 0400 12/26/20 0500 12/26/20 1034  BP: 122/72 117/78  109/65  Pulse: 92   98  Resp: 16 18  17   Temp: 98 F (36.7 C) 98.3 F (36.8 C)  98.2 F (36.8 C)  TempSrc: Oral Oral    SpO2: 94% 94%  95%  Weight:   105.7 kg   Height:         Intake/Output Summary (Last 24 hours) at 12/26/2020 1353 Last data filed at 12/26/2020 1300 Gross per 24 hour  Intake 480 ml  Output 100 ml  Net 380 ml   Filed Weights   12/24/20 0500 12/24/20 0725 12/26/20 0500  Weight: 105.4 kg 105.4 kg 105.7 kg     Exam: General exam: Appears calm and comfortable  Respiratory system: Clear to auscultation. Respiratory effort normal. Cardiovascular system: S1 & S2 heard, RRR. No JVD, murmurs, rubs, gallops or clicks. No pedal edema. Gastrointestinal system: Abdomen is nondistended, soft and nontender. No organomegaly or masses felt. Normal bowel sounds heard. Central nervous system: Alert and oriented. No focal neurological deficits. Extremities: Symmetric 5 x 5 power. Skin: No rashes, lesions or ulcers.  Psychiatry: Judgement and insight appear normal. Mood & affect appropriate.   Assessment & Plan:   Unfortunate 60 year old man status post cardiac transplant now with metastatic high-grade bladder CA  Constipation Resolved.  Continue lactulose.  Anasarca and electrolyte abnormalities Per hemodialysis and nephrology service  History of cardiac transplant Continue tacrolimus, to keep goal levels between 5-8 Steroid taper has been completed and is presently on prednisone 5 mg daily. Patient is followed by the [facility] transplant team Dr. Kae Heller has been consulted and is providing consultation.  Acute urinary retention with gross hematuria: Patient had acute urine retention with some costovertebral yesterday.  Unsure whether his hematuria was traumatic due to in and out catheter.  Patient has not consented for indwelling catheter.  Copied from previous notes: Retroperitoneal adenopathy and perinephric nodularity, high-grade bladder CA, recently diagnosed -Status post IR guided retroperitoneal biopsy, pathology positive for poorly differentiated carcinoma.  Recent hospitalization 12/30-1/3 for  AKI complicated by hydronephrosis in the  setting of new retroperitoneal LAD, underwent stent placement. - Urology and oncology Consulted but no recent notes. --Startedon palliative chemo 1/14, palliative medicine following  -  C1D8 Padcev chemo  On 1/21, next due 1/28   AKI on CKD stage 3a, requiring dialysis, new problem  -Baseline creatinine 1.2-1.4 from 08/2020, 1.8 from last hospitalization -Suspect lymphatic obstruction due to malignancy, low albumin, renal function worsening over the last few weeks.  Nephrology was consulted. -Tunneled HD line was placed on the 1/17 and he has been started on dialysis. Bladder was noted to be distended on 1/20 he was seen by urology who recommended placement of a Foley if he was unable to void.   Unsure whether this is going to be long-term dialysis for him.  Await nephrology's opinion on that.  He has been started on IV Lasix 120 mg twice daily but I do not see decent urine output if charting is accurate.  Acute right lower extremity DVT -Continue IV heparin, H&H stable -If no further procedures planned will discuss transition to apixaban as his hemoglobin has been stable and if he continues to require dialysis would be unable to transition him to Lovenox.  If he is able to get off dialysis ideally he could be transitioned to Lovenox with monitoring of Xa levels.  Oncology has also recommended Lovenox.  Elevated ALT- monitor as immunosuppressive medications cleared hepatically   Small retroperitoneal hemorrhage status post biopsy -H&H stable  Small pericardial effusion -Incidentally noted on echocardiogram, without tamponade physiology  Chronic combined systolic and diastolic heart failure, history of cardiac transplant -Patient is status post heart transplant, prior EF 10% - EF has now corrected to 60 to 65% -Volume control with HD, initiated on 1/17 -Dr. Haroldine Laws was contacted on admission as he has been in contact with transplant team as below.  History of cardiac  transplant -Continue tacrolimus, to keep goal levels between 5-8 -Continue dexamethasone, reduced dose to 4 mg daily by Dr. Irene Limbo on 1/18 and okay to wean down to his baseline prednisone 5 mg daily--per note from Dr. Jeffie Pollock, to taper as quickly as possible which per Dr. Grier Mitts note okay to do so---will schedule taper with pharmacy taper scheduled and ordered--down to 5 mg on Wednesday.  -Cardiology was consulted, per Dr Haroldine Laws, EF is normal, recommended to continue the immunosuppressive regimen and follow the tacrolimus levels. Dr. Haroldine Laws has notified/updated the transplant team at [facility]   Leukocytosis-clinically without infection and has been stable.  Could very well be due to steroids and malignancy itself.  Improving.  He is afebrile.  Hypothyroidism -Continue Synthroid  Code Status: DNR DVT Prophylaxis: IV heparin Family Communication:  None at bedside.  Discussed in length with patient.  He is alert and oriented and competent. DVT prophylaxis: Heparin Code Status: DNR Family Communication: None Disposition Plan:   Patient is from: Home  Anticipated Discharge Location: TBD  Barriers to Discharge: Ongoing need for hemodialysis with anasarca and on palliative chemotherapy  Is patient medically stable for Discharge: No   Procedures:  HD cath 1.17 IR guided biopsy 1/06  Consultants:   Nephrology Oncology Palliative IR   Data Reviewed:  Basic Metabolic Panel: Recent Labs  Lab 12/22/20 0948 12/23/20 0243 12/24/20 0718 12/25/20 0500 12/26/20 0429  NA 131* 130* 131* 129* 132*  K 5.3* 5.4* 5.3* 4.7 5.3*  CL 96* 95* 94* 94* 96*  CO2 20* 21* 23 20* 19*  GLUCOSE 101* 121* 111* 100* 94  BUN 80* 91* 105* 74* 87*  CREATININE 3.46* 3.77* 4.14* 3.33* 3.83*  CALCIUM 8.8* 8.8* 8.8* 8.7* 8.9  PHOS 5.5* 5.3* 5.6* 4.1  --    Liver Function Tests: Recent Labs  Lab 12/21/20 0357 12/22/20 0948 12/22/20 1001 12/23/20 0243 12/24/20 0718 12/25/20 0500 12/26/20 0429   AST 37  --  46*  --   --   --  60*  ALT 13  --  12  --   --   --  19  ALKPHOS 49  --  47  --   --   --  58  BILITOT 0.6  --  0.9  --   --   --  0.9  PROT 5.7*  --  5.6*  --   --   --  5.7*  ALBUMIN 2.9*   < > 2.9* 2.9* 2.8* 2.9* 3.1*   < > = values in this interval not displayed.   No results for input(s): LIPASE, AMYLASE in the last 168 hours. No results for input(s): AMMONIA in the last 168 hours. CBC: Recent Labs  Lab 12/21/20 0357 12/22/20 0404 12/23/20 0243 12/24/20 0718 12/25/20 0500 12/26/20 0429  WBC 25.4* 22.1* 20.8* 25.4* 22.9* 20.2*  NEUTROABS 22.3*  --   --   --   --   --   HGB 13.2 13.6 13.3 12.9* 12.0* 13.4  HCT 40.3 42.1 39.7 38.7* 35.5* 42.2  MCV 95.5 96.6 94.7 94.9 95.2 98.4  PLT 355 336 363 355 292 251   Cardiac Enzymes: No results for input(s): CKTOTAL, CKMB, CKMBINDEX, TROPONINI in the last 168 hours. BNP (last 3 results) No results for input(s): PROBNP in the last 8760 hours. CBG: No results for input(s): GLUCAP in the last 168 hours.  No results found for this or any previous visit (from the past 240 hour(s)).    Studies: No results found.   Scheduled Meds: . allopurinol  200 mg Oral Daily  . artificial tears  1 application Both Eyes QHS  . azelastine  1 spray Each Nare Daily   And  . fluticasone  1 spray Each Nare Daily  . cetirizine  10 mg Oral QHS  . [START ON 12/27/2020] Chlorhexidine Gluconate Cloth  6 each Topical Q0600  . dronabinol  2.5 mg Oral BID AC  . fentaNYL  1 patch Transdermal Q72H  . lactulose  20 g Oral BID  . levothyroxine  125 mcg Oral Daily  . metoCLOPramide  5 mg Oral TID AC & HS  . midodrine  5 mg Oral TID WC  . nystatin  5 mL Oral QID  . polyvinyl alcohol  1 drop Both Eyes TID  . predniSONE  5 mg Oral Q breakfast  . senna-docusate  3 tablet Oral BID  . Tacrolimus ER  1.5 mg Oral Daily  . traZODone  100 mg Oral QHS   Continuous Infusions: . sodium chloride    . furosemide 120 mg (12/26/20 1021)  . heparin  1,350 Units/hr (12/26/20 1310)    Principal Problem:   Metastatic urothelial carcinoma ([place]) Active Problems:   SYSTOLIC HEART FAILURE, CHRONIC   Acute kidney injury superimposed on CKD (HCC)   Hypothyroidism   Hyponatremia   Back pain   Retroperitoneal lymphadenopathy   Liver lesion   Acute on chronic combined systolic and diastolic CHF (congestive heart failure) (HCC)   Acute DVT of right tibial vein (HCC)   Retroperitoneal hemorrhage   Neoplasm related pain   Encounter for antineoplastic chemotherapy  Abdominal distension     Darliss Cheney, [facility]  If 7PM-7AM, please contact night-coverage [email or web address] 12/26/2020, 1:53 PM    LOS: 19 days

## 2020-12-26 NOTE — Progress Notes (Signed)
[facility] Progress Note     Assessment/ Plan:   1. Dialysis Dependent AKI on CKD 3a - b/l creat 1.2- 1.4 from oct 2021.   Renal function worsening over last few weeks. UA +wbc's and rbc's, no sig proteinuria. Imaging shows stable R ureteral stent and stable R collecting system dilatation. Has new significant leg edema. ECHO showed normal EF.   Creat 1.8 on admission > up to 2.3 then 2.8 after lasix tried, then 3.1 >  3.2 > 3.4 > up to 4.5 -- HD.  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   IR placed [facility] 1/17 R IJ, HD #1 1/17  HD #3 1/21: 3hr, intra-dialytic hypotension therefore order midorine 10 mg and albumin 25 g. UF as tolerated.  HD #4 1/24 again limited by hypotension limiting UF despite midodrine and albumin support.   Next HD 1/27 per schedule limitations.    He's making decent UOP so I will give BID lasix IV to facilitate volume offloading and continue maneuvers with HD to faciliate UF.  UOP appears minimal - If remains so through day we will d/c lasix.   2. Hyperkalemia -2K dialysate 3. Hyponatremia -  Inability to excrete free water 2/2 low GFR. Fluid restriction, improving with HD. 4. High-grade bladder cancer- recent dx , treated w/ BCG in Aug 2021, sp TURBT x 2 Aug and Sept. New RP adenopathy w/ R hydro in Dec 2021 > cysto and R ureteral stent per urology. 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 Onc consulting w/ [facility].  Has started chemotherapy (palliative) with Dr. Irene Limbo 5. H/o Hodgkin's lymphoma - remote 6. H/o cardiac transplant - taking prograf 1.5 mg /d and pred 5 /d per CV 7. Acute RLE DVT - per primary team. Positive for superficial venous thrombosis/thrombophlebitis of the mid right leg greater saphenous vein but neg for DVT. On heparin gtt. 8. Chronic diast CHF - had prior LVEF 10% but now has corrected to 60-65%  Dispo - I'm glad  palliative care is involved - he seems miserable and I'm hoping they can help with symptom management and goals of care discussions in light of incurable metastatic malignancy.      Subjective:   Feels like needs to have BM but can't; did go earlier in the day.   Urinary frequency and occ urge to go but unable - since urologic interventions this has been issue.  Ongoing back pain.  Transition from DNR to full code. Wife at bedside.   Objective:   BP 109/65 (BP Location: Right Arm)   Pulse 98   Temp 98.2 F (36.8 C)   Resp 17   Ht 6\' 2"  (1.88 m)   Wt 105.7 kg   SpO2 95%   BMI 29.92 kg/m   Intake/Output Summary (Last 24 hours) at 12/26/2020 1333 Last data filed at 12/26/2020 1300 Gross per 24 hour  Intake 480 ml  Output 100 ml  Net 380 ml   Weight change: 0.3 kg  Physical Exam: Genalert, NAD No jvd or bruits Chest clear bilatto bases RRR no MRG Abdprotuberant, no ascites by exam,  +BS Ext3+ pitting bilat LE from feet to hips to abd/chest wall Neuro is alert, Ox 3 , nf TDC site clean.   Imaging: No results found.  Labs: [employer] 12/20/20 3007 12/21/20 6226 12/22/20 3335 12/23/20 0243 12/24/20 0718 12/25/20 0500 12/26/20 4562  NA 128* 127* 131* 130* 131* 129* 132*  K 4.8 5.0 5.3* 5.4* 5.3* 4.7 5.3*  CL 93* 92* 96* 95* 94* 94* 96*  CO2 21* 21* 20* 21* 23 20* 19*  GLUCOSE 127* 110* 101* 121* 111* 100* 94  BUN 84* 104* 80* 91* 105* 74* 87*  CREATININE 4.04* 4.42* 3.46* 3.77* 4.14* 3.33* 3.83*  CALCIUM 8.4* 8.6* 8.8* 8.8* 8.8* 8.7* 8.9  PHOS  --   --  5.5* 5.3* 5.6* 4.1  --    CBC Recent Labs  Lab 12/21/20 0357 12/22/20 0404 12/23/20 0243 12/24/20 0718 12/25/20 0500 12/26/20 0429  WBC 25.4*   < > 20.8* 25.4* 22.9* 20.2*  NEUTROABS 22.3*  --   --   --   --   --   HGB 13.2   < > 13.3 12.9* 12.0* 13.4  HCT 40.3   < > 39.7 38.7* 35.5* 42.2  MCV 95.5   < > 94.7 94.9 95.2 98.4  PLT 355   < > 363 355 292 251   < > = values in this interval  not displayed.    Medications:    . allopurinol  200 mg Oral Daily  . artificial tears  1 application Both Eyes QHS  . azelastine  1 spray Each Nare Daily   And  . fluticasone  1 spray Each Nare Daily  . cetirizine  10 mg Oral QHS  . Chlorhexidine Gluconate Cloth  6 each Topical Q0600  . dronabinol  2.5 mg Oral BID AC  . fentaNYL  1 patch Transdermal Q72H  . lactulose  20 g Oral BID  . levothyroxine  125 mcg Oral Daily  . metoCLOPramide  5 mg Oral TID AC & HS  . midodrine  5 mg Oral TID WC  . nystatin  5 mL Oral QID  . polyvinyl alcohol  1 drop Both Eyes TID  . predniSONE  5 mg Oral Q breakfast  . senna-docusate  3 tablet Oral BID  . Tacrolimus ER  1.5 mg Oral Daily  . traZODone  100 mg Oral QHS      Justin Mend, MD  12/26/2020, 1:33 PM

## 2020-12-26 NOTE — Progress Notes (Signed)
[facility] for heparin Indication: DVT  Labs: Recent Labs    12/24/20 0718 12/25/20 0500 12/26/20 0429 12/26/20 1813  HGB 12.9* 12.0* 13.4  --   HCT 38.7* 35.5* 42.2  --   PLT 355 292 251  --   HEPARINUNFRC  --  0.23* 0.24* 0.55  CREATININE 4.14* 3.33* 3.83*  --     Assessment: 60 yo male on heparin drip for RLE DVT.  Heparin level therapeutic.  No further blood clots/blood in patient's urine per RN.  Goal of Therapy:  Heparin level 0.3-0.7 units/ml  Monitor platelets by anticoagulation protocol: yes   Plan:  Continue heparin infusion at 1350 units/hr F/U AM labs  Glenn Gonzalez D. Mina Marble, PharmD, BCPS, [facility] 12/26/2020, 7:36 PM

## 2020-12-26 NOTE — Evaluation (Signed)
Physical Therapy Evaluation Patient Details Name: Glenn Gonzalez MRN: [id number] DOB: 1961-07-24 Today's Date: 12/26/2020   History of Present Illness  Glenn Gonzalez is a 60 y.o. male with medical history of hydronephrosis, hodgkin's lymphoma, colon cancer, bladder cancer, history of heart transplant, gout, depression, hypothyroidism. presents with c/o pain pain.  Clinical Impression   Patient received in bed, motivated to participate in therapy but at the same time very anxious regarding mobility. Session somewhat limited by waxing and waning urge to urinate/have BM as well as fatigue and high anxiety levels. Able to roll side to side for pericare with ModA, did need ModAx2 to get to EOB with HOB elevated but only able to tolerate sitting at bedside for about 30-45 second before becoming fatigued. Positioned to comfort with all needs met this morning. Will continue to follow and hone in recommendations as medical prognosis and patient/family wishes become clearer.     Follow Up Recommendations SNF;Supervision/Assistance - 24 hour;Other (comment) (SNF vs HHPT with 24/7A pending progress and medical prognosis)    Equipment Recommendations  Rolling walker with 5" wheels;3in1 (PT);Wheelchair (measurements PT);Wheelchair cushion (measurements PT);Other (comment);Hospital bed (hoyer lift with pad)    Recommendations for Other Services       Precautions / Restrictions Precautions Precautions: Fall Precaution Comments: high pain and anxiety Restrictions Weight Bearing Restrictions: No      Mobility  Bed Mobility Overal bed mobility: Needs Assistance Bed Mobility: Rolling;Supine to Sit;Sit to Supine Rolling: Mod assist   Supine to sit: Mod assist;+2 for physical assistance;HOB elevated Sit to supine: Mod assist;+2 for physical assistance;HOB elevated   General bed mobility comments: very anxious with mobility, able to get to EOB with multiple attempts and max encouragement,  tolerated sitting maybe 30-45 seconds before needing to lay back down due to fatigue and urge to urinate/have BM    Transfers                 General transfer comment: unable  Ambulation/Gait             General Gait Details: unable  Stairs            Wheelchair Mobility    Modified Rankin (Stroke Patients Only)       Balance Overall balance assessment: Needs assistance Sitting-balance support: Bilateral upper extremity supported;Feet supported Sitting balance-Leahy Scale: Fair Sitting balance - Comments: min guard for safety to maintain upright statically                                     Pertinent Vitals/Pain Pain Assessment: Faces Faces Pain Scale: Hurts whole lot Pain Location: bladder (urge to urinate), back, generalized Pain Descriptors / Indicators: Aching;Grimacing;Guarding Pain Intervention(s): Limited activity within patient's tolerance;Monitored during session;Repositioned    Home Living                        Prior Function                 Hand Dominance        Extremity/Trunk Assessment   Upper Extremity Assessment Upper Extremity Assessment: Defer to OT evaluation    Lower Extremity Assessment Lower Extremity Assessment: Generalized weakness    Cervical / Trunk Assessment Cervical / Trunk Assessment: Kyphotic  Communication      Cognition Arousal/Alertness: Awake/alert Behavior During Therapy: WFL for tasks assessed/performed;Anxious Overall Cognitive Status: Within Functional Limits  for tasks assessed                                 General Comments: pleasant and cooperative, motivated but very anxious with mobility and frustrated over waxing/waning urge to urinate/have BM      General Comments      Exercises     Assessment/Plan    PT Assessment Patient needs continued PT services  PT Problem List Decreased strength;Decreased knowledge of use of DME;Decreased  activity tolerance;Decreased safety awareness;Decreased balance;Decreased mobility;Decreased coordination       PT Treatment Interventions DME instruction;Balance training;Gait training;Stair training;Functional mobility training;Patient/family education;Therapeutic activities;Therapeutic exercise    PT Goals (Current goals can be found in the Care Plan section)  Acute Rehab PT Goals Patient Stated Goal: try to get stronger PT Goal Formulation: With patient Time For Goal Achievement: 01/09/21 Potential to Achieve Goals: Fair    Frequency Min 2X/week   Barriers to discharge        Co-evaluation PT/OT/SLP Co-Evaluation/Treatment: Yes Reason for Co-Treatment: Complexity of the patient's impairments (multi-system involvement);For patient/therapist safety;To address functional/ADL transfers PT goals addressed during session: Mobility/safety with mobility;Balance;Strengthening/ROM         AM-PAC PT "6 Clicks" Mobility  Outcome Measure Help needed turning from your back to your side while in a flat bed without using bedrails?: A Lot Help needed moving from lying on your back to sitting on the side of a flat bed without using bedrails?: A Lot Help needed moving to and from a bed to a chair (including a wheelchair)?: Total Help needed standing up from a chair using your arms (e.g., wheelchair or bedside chair)?: Total Help needed to walk in hospital room?: Total Help needed climbing 3-5 steps with a railing? : Total 6 Click Score: 8    End of Session   Activity Tolerance: Patient limited by fatigue;Patient limited by pain (and constant intense urge to urinate/have BM) Patient left: in bed;with call bell/phone within reach Nurse Communication: Mobility status;Other (comment) (urine output during session, possible pressure sores forming on low cervical/high thoracic vertebrae) PT Visit Diagnosis: Difficulty in walking, not elsewhere classified (R26.2);Muscle weakness (generalized)  (M62.81);Unsteadiness on feet (R26.81);Other abnormalities of gait and mobility (R26.89)    Time: 5790-0920 PT Time Calculation (min) (ACUTE ONLY): 46 min   Charges:   PT Evaluation $PT Eval Moderate Complexity: 1 Mod PT Treatments $Therapeutic Activity: 8-22 mins        Windell Norfolk, DPT, PN1   Supplemental Physical Therapist [place]    Pager [phone] Acute Rehab Office [phone]

## 2020-12-26 NOTE — Evaluation (Signed)
Occupational Therapy Evaluation Patient Details Name: Glenn Gonzalez MRN: [id number] DOB: [birth year] Today's Date: 12/26/2020    History of Present Illness Glenn Gonzalez is a 60 y.o. male with medical history of hydronephrosis, hodgkin's lymphoma, colon cancer, bladder cancer, history of heart transplant, gout, depression, hypothyroidism. presents with c/o pain pain.   Clinical Impression   Pt motivated to participate in therapy session though anxious movement. Session somewhat limited by waxing and waning urge to urinate/have BM as well as fatigue and pain levels despite premedication. Pt able to sit EOB with Mod A x 2 but only able to sit < 1 minute before fatiguing. Pt setup for urinal use throughout session but requires Total A for posterior hygiene with frequent, intermittent bowel incontinence. Pt requires Mod A for UB ADLs, Total A for LB ADLs due to overall deconditioning and low activity tolerance. Plan to educate on energy conservation, UE HEP to improve strength and progress OOB activities within pt's tolerance. Will continue to follow for acute OT services and finalize recommendations once pt/family decisions, GOC made clear, as well as medical prognosis.     Follow Up Recommendations  SNF;Supervision/Assistance - 24 hour (vs hospice or home with 24/7 support; to be continually assessed within pt's [place])    Equipment Recommendations  Wheelchair (measurements OT);Tub/shower seat;Wheelchair cushion (measurements OT);Hospital bed    Recommendations for Other Services       Precautions / Restrictions Precautions Precautions: Fall Precaution Comments: high pain and anxiety Restrictions Weight Bearing Restrictions: No      Mobility Bed Mobility Overal bed mobility: Needs Assistance Bed Mobility: Rolling;Supine to Sit;Sit to Supine Rolling: Mod assist   Supine to sit: Mod assist;+2 for physical assistance;HOB elevated Sit to supine: Mod assist;+2 for physical  assistance;HOB elevated   General bed mobility comments: very anxious with mobility, able to get to EOB with multiple attempts and max encouragement, tolerated sitting maybe 30-45 seconds before needing to lay back down due to fatigue and urge to urinate/have BM    Transfers                 General transfer comment: unable    Balance Overall balance assessment: Needs assistance Sitting-balance support: Bilateral upper extremity supported;Feet supported Sitting balance-Leahy Scale: Fair Sitting balance - Comments: min guard for safety to maintain upright statically                                   ADL either performed or assessed with clinical judgement   ADL Overall ADL's : Needs assistance/impaired Eating/Feeding: Independent;Sitting   Grooming: Set up;Sitting   Upper Body Bathing: Moderate assistance;Bed level   Lower Body Bathing: Total assistance;Bed level   Upper Body Dressing : Minimal assistance;Bed level Upper Body Dressing Details (indicate cue type and reason): Min A to don clean hospital gown Lower Body Dressing: Total assistance;Bed level       Toileting- Clothing Manipulation and Hygiene: Total assistance;Bed level Toileting - Clothing Manipulation Details (indicate cue type and reason): Frequent urinary/bowel urges throughout session. Setup for urinal use x 3-4 during session. Total A for clean up after bowel incontinence x 2       General ADL Comments: Limited by severe deconditioning, low activity tolerance     Vision Patient Visual Report: No change from baseline Vision Assessment?: No apparent visual deficits     Perception     Praxis      Pertinent  Vitals/Pain Pain Assessment: Faces Faces Pain Scale: Hurts whole lot Pain Location: bladder (urge to urinate), back, generalized Pain Descriptors / Indicators: Aching;Grimacing;Guarding Pain Intervention(s): Monitored during session;Repositioned;RN gave pain meds during session      Hand Dominance Right   Extremity/Trunk Assessment Upper Extremity Assessment Upper Extremity Assessment: Generalized weakness   Lower Extremity Assessment Lower Extremity Assessment: Defer to PT evaluation   Cervical / Trunk Assessment Cervical / Trunk Assessment: Kyphotic   Communication Communication Communication: No difficulties   Cognition Arousal/Alertness: Awake/alert Behavior During Therapy: WFL for tasks assessed/performed;Anxious Overall Cognitive Status: Within Functional Limits for tasks assessed                                 General Comments: pleasant and cooperative, motivated but very anxious with mobility and frustrated over waxing/waning urge to urinate/have BM   General Comments  Pt noted with redness on bottom and cervical region of neck - RN aware. Educated on increasing tolerance of sitting position in bed during the day, completing simple ADLs bed level to improve endurance and UE strength.    Exercises     Shoulder Instructions      Home Living Family/patient expects to be discharged to:: Private residence Living Arrangements: Spouse/significant other;Children Available Help at Discharge: Family;Available 24 hours/day Type of Home: House Home Access: Stairs to enter [employer] of Steps: 2 Entrance Stairs-Rails: None Home Layout: One level     Bathroom Shower/Tub: Occupational psychologist: Handicapped height     Home Equipment: Cane - single point;Bedside commode;Shower seat          Prior Functioning/Environment Level of Independence: Independent        Comments: Pt reports independent wtih ADLs, ambulation, spouse works from home, multiple siblings able to assist if needed.        OT Problem List: Decreased strength;Decreased activity tolerance;Impaired balance (sitting and/or standing);Pain      OT Treatment/Interventions: Self-care/ADL training;Therapeutic exercise;Energy  conservation;DME and/or AE instruction;Therapeutic activities;Patient/family education;Balance training    OT Goals(Current goals can be found in the care plan section) Acute Rehab OT Goals Patient Stated Goal: try to get stronger OT Goal Formulation: With patient Time For Goal Achievement: 01/09/21 Potential to Achieve Goals: Good ADL Goals Pt Will Perform Grooming: with set-up;sitting Pt Will Perform Upper Body Dressing: with set-up;sitting Pt Will Transfer to Toilet: with mod assist;stand pivot transfer;bedside commode Pt/caregiver will Perform Home Exercise Program: Increased strength;Both right and left upper extremity;Independently;With written HEP provided Additional ADL Goal #1: Pt to verbalize at least 2 energy conservation strategies to implement during ADLs Additional ADL Goal #2: Pt to increase activity tolerance to 3 minutes during functional tasks  OT Frequency: Min 2X/week   Barriers to D/C:            Co-evaluation PT/OT/SLP Co-Evaluation/Treatment: Yes Reason for Co-Treatment: Complexity of the patient's impairments (multi-system involvement);For patient/therapist safety;To address functional/ADL transfers PT goals addressed during session: Mobility/safety with mobility;Balance;Strengthening/ROM OT goals addressed during session: ADL's and self-care      AM-PAC OT "6 Clicks" Daily Activity     Outcome Measure Help from another person eating meals?: None Help from another person taking care of personal grooming?: A Little Help from another person toileting, which includes using toliet, bedpan, or urinal?: Total Help from another person bathing (including washing, rinsing, drying)?: A Lot Help from another person to put on and taking off regular upper body clothing?:  A Little Help from another person to put on and taking off regular lower body clothing?: Total 6 Click Score: 14   End of Session Nurse Communication: Mobility status;Other (comment) (skin, pain,  bowels)  Activity Tolerance: Patient limited by pain;Patient limited by fatigue Patient left: in bed;with call bell/phone within reach;with bed alarm set  OT Visit Diagnosis: Unsteadiness on feet (R26.81);Muscle weakness (generalized) (M62.81);Pain                Time: 3085-6943 OT Time Calculation (min): 46 min Charges:  OT General Charges $OT Visit: 1 Visit OT Evaluation $OT Eval Moderate Complexity: 1 Mod  Layla Maw, OTR/L  Layla Maw 12/26/2020, 10:54 AM

## 2020-12-27 DIAGNOSIS — C7910 Secondary malignant neoplasm of unspecified urinary organs: Secondary | ICD-10-CM | POA: Diagnosis not present

## 2020-12-27 LAB — HEPARIN LEVEL (UNFRACTIONATED): Heparin Unfractionated: 0.51 IU/mL (ref 0.30–0.70)

## 2020-12-27 LAB — CBC
HCT: 41.1 % (ref 39.0–52.0)
Hemoglobin: 13.1 g/dL (ref 13.0–17.0)
MCH: 31 pg (ref 26.0–34.0)
MCHC: 31.9 g/dL (ref 30.0–36.0)
MCV: 97.4 fL (ref 80.0–100.0)
Platelets: 272 10*3/uL (ref 150–400)
RBC: 4.22 MIL/uL (ref 4.22–5.81)
RDW: 16.1 % — ABNORMAL HIGH (ref 11.5–15.5)
WBC: 15.4 10*3/uL — ABNORMAL HIGH (ref 4.0–10.5)
nRBC: 0 % (ref 0.0–0.2)

## 2020-12-27 MED ORDER — ALBUMIN HUMAN 25 % IV SOLN
INTRAVENOUS | Status: AC
  Filled 2020-12-27: qty 100

## 2020-12-27 MED ORDER — HEPARIN SODIUM (PORCINE) 1000 UNIT/ML IJ SOLN
INTRAMUSCULAR | Status: AC
  Filled 2020-12-27: qty 4

## 2020-12-27 MED ORDER — HYDROMORPHONE HCL 1 MG/ML IJ SOLN
INTRAMUSCULAR | Status: AC
  Filled 2020-12-27: qty 1.5

## 2020-12-27 NOTE — Progress Notes (Signed)
[facility] Progress Note     Assessment/ Plan:   1. Dialysis Dependent AKI on CKD 3a - b/l creat 1.2- 1.4 from oct 2021.   Renal function worsening over last few weeks. UA +wbc's and rbc's, no sig proteinuria. Imaging shows stable R ureteral stent and stable R collecting system dilatation. Has new significant leg edema. ECHO showed normal EF.   Creat 1.8 on admission > up to 2.3 then 2.8 after lasix tried, then 3.1 >  3.2 > 3.4 > up to 4.5 -- HD.  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   IR placed [facility] 1/17 R IJ, HD #1 1/17  HD #3 1/21: 3hr, intra-dialytic hypotension therefore order midorine 10 mg and albumin 25 g. UF as tolerated.  HD #4 1/24 again limited by hypotension limiting UF despite midodrine and albumin support.   Next HD 1/27, today    D/c diuretic given lack of response 2. Hyperkalemia -2K dialysate 3. Hyponatremia -  Inability to excrete free water 2/2 low GFR. Fluid restriction, improving with HD. 4. High-grade bladder cancer- recent dx , treated w/ BCG in Aug 2021, sp TURBT x 2 Aug and Sept. New RP adenopathy w/ R hydro in Dec 2021 > cysto and R ureteral stent per urology. 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 Onc consulting w/ [facility].  Has started chemotherapy (palliative) with Dr. Irene Limbo 5. H/o Hodgkin's lymphoma - remote 6. H/o cardiac transplant - taking prograf 1.5 mg /d and pred 5 /d per CV 7. Acute RLE DVT - per primary team. Positive for superficial venous thrombosis/thrombophlebitis of the mid right leg greater saphenous vein but neg for DVT. On heparin gtt. 8. Chronic diast CHF - had prior LVEF 10% but now has corrected to 60-65%  Dispo - I'm glad palliative care is involved - he seems miserable and I'm hoping they can help with symptom management and goals of care discussions in light of incurable metastatic malignancy.  I have not  seen him out of bed this week and he seems very weak - if we are moving towards possible discharge with outpatient dialysis he needs to be strong enough to sit in chair for dialysis treatment.  Additionally I wonder if his back pain will be tolerable in dialysis chair.  PT is following - see SNF vs home with [facility].      Subjective:   Seen in room this AM - awake and alert but appears delirious - talking about a tiny garden that grows and the switched to discussing urine outpt. Wife not prosent.   Objective:   BP 100/64 (BP Location: Right Arm)   Pulse 99   Temp 98.2 F (36.8 C) (Oral)   Resp 18   Ht 6\' 2"  (1.88 m)   Wt 101.8 kg   SpO2 95%   BMI 28.81 kg/m   Intake/Output Summary (Last 24 hours) at 12/27/2020 8546 Last data filed at 12/27/2020 0900 Gross per 24 hour  Intake 600 ml  Output 100 ml  Net 500 ml   Weight change: -3.9 kg  Physical Exam: Genalert, NAD No jvd or bruits Chest clear bilatto bases RRR no MRG Abdprotuberant, no ascites by exam,  +BS Ext3+ pitting bilat LE from feet to hips to abd/chest wall Neuro is alert but conversation is not appropriate - tiny gardens [facility] site clean.   Imaging: No results found.  Labs: BMET Recent  Labs  Lab 12/21/20 0357 12/22/20 0948 12/23/20 0243 12/24/20 0718 12/25/20 0500 12/26/20 0429  NA 127* 131* 130* 131* 129* 132*  K 5.0 5.3* 5.4* 5.3* 4.7 5.3*  CL 92* 96* 95* 94* 94* 96*  CO2 21* 20* 21* 23 20* 19*  GLUCOSE 110* 101* 121* 111* 100* 94  BUN 104* 80* 91* 105* 74* 87*  CREATININE 4.42* 3.46* 3.77* 4.14* 3.33* 3.83*  CALCIUM 8.6* 8.8* 8.8* 8.8* 8.7* 8.9  PHOS  --  5.5* 5.3* 5.6* 4.1  --    CBC Recent Labs  Lab 12/21/20 0357 12/22/20 0404 12/24/20 0718 12/25/20 0500 12/26/20 0429 12/27/20 0438  WBC 25.4*   < > 25.4* 22.9* 20.2* 15.4*  NEUTROABS 22.3*  --   --   --   --   --   HGB 13.2   < > 12.9* 12.0* 13.4 13.1  HCT 40.3   < > 38.7* 35.5* 42.2 41.1  MCV 95.5   < > 94.9 95.2 98.4 97.4  PLT 355   <  > 355 292 251 272   < > = values in this interval not displayed.    Medications:    . allopurinol  200 mg Oral Daily  . artificial tears  1 application Both Eyes QHS  . azelastine  1 spray Each Nare Daily   And  . fluticasone  1 spray Each Nare Daily  . cetirizine  10 mg Oral QHS  . Chlorhexidine Gluconate Cloth  6 each Topical Q0600  . dronabinol  2.5 mg Oral BID AC  . fentaNYL  1 patch Transdermal Q72H  . lactulose  20 g Oral BID  . levothyroxine  125 mcg Oral Daily  . metoCLOPramide  5 mg Oral TID AC & HS  . midodrine  5 mg Oral TID WC  . nystatin  5 mL Oral QID  . polyvinyl alcohol  1 drop Both Eyes TID  . predniSONE  5 mg Oral Q breakfast  . senna-docusate  3 tablet Oral BID  . Tacrolimus ER  1.5 mg Oral Daily  . traZODone  100 mg Oral QHS      Justin Mend, MD  12/27/2020, 9:24 AM

## 2020-12-27 NOTE — Progress Notes (Signed)
Pt in hemo dialysis  

## 2020-12-27 NOTE — Progress Notes (Cosign Needed)
HEMATOLOGY-ONCOLOGY PROGRESS NOTE  SUBJECTIVE: The patient was seen during dialysis today.  He reports that he is feeling better overall.  Pain well controlled.  He states that lower extremity edema has improved.  REVIEW OF SYSTEMS:   Constitutional: Denies fevers, chills Eyes: Denies blurriness of vision Ears, nose, mouth, throat, and face: Denies mucositis or sore throat Respiratory: Denies cough, dyspnea or wheezes Cardiovascular: Denies palpitation, chest discomfort Gastrointestinal:  Denies nausea, heartburn or change in bowel habits Skin: Denies abnormal skin rashes Lymphatics: Denies new lymphadenopathy or easy bruising Neurological:Denies numbness, tingling or new weaknesses Behavioral/Psych: Mood is stable, no new changes  Extremities: No lower extremity edema All other systems were reviewed with the patient and are negative.  I have reviewed the past medical history, past surgical history, social history and family history with the patient and they are unchanged from previous note.   PHYSICAL EXAMINATION: ECOG PERFORMANCE STATUS: 3 - Symptomatic, >50% confined to bed  Vitals:   12/27/20 1330 12/27/20 1400  BP: 103/76 91/80  Pulse:    Resp:    Temp:    SpO2:     Filed Weights   12/26/20 0500 12/27/20 0700 12/27/20 1136  Weight: 105.7 kg 101.8 kg 104.2 kg    Intake/Output from previous day: 01/26 0701 - 01/27 0700 In: 720 [P.O.:720] Out: 100 [Urine:100]  GENERAL:alert, no distress and comfortable RESPIRATORY: Lungs clear to auscultation bilaterally CV: Regular rate and rhythm, trace lower extremity edema NEURO: alert & oriented x 3 with fluent speech, no focal motor/sensory deficits  LABORATORY DATA:  I have reviewed the data as listed CMP Latest Ref Rng & Units 12/26/2020 12/25/2020 12/24/2020  Glucose 70 - 99 mg/dL 94 100(H) 111(H)  BUN 6 - 20 mg/dL 87(H) 74(H) 105(H)  Creatinine 0.61 - 1.24 mg/dL 3.83(H) 3.33(H) 4.14(H)  Sodium 135 - 145 mmol/L 132(L)  129(L) 131(L)  Potassium 3.5 - 5.1 mmol/L 5.3(H) 4.7 5.3(H)  Chloride 98 - 111 mmol/L 96(L) 94(L) 94(L)  CO2 22 - 32 mmol/L 19(L) 20(L) 23  Calcium 8.9 - 10.3 mg/dL 8.9 8.7(L) 8.8(L)  Total Protein 6.5 - 8.1 g/dL 5.7(L) - -  Total Bilirubin 0.3 - 1.2 mg/dL 0.9 - -  Alkaline Phos 38 - 126 U/L 58 - -  AST 15 - 41 U/L 60(H) - -  ALT 0 - 44 U/L 19 - -    Lab Results  Component Value Date   WBC 15.4 (H) 12/27/2020   HGB 13.1 12/27/2020   HCT 41.1 12/27/2020   MCV 97.4 12/27/2020   PLT 272 12/27/2020   NEUTROABS 22.3 (H) 12/21/2020    CT ABDOMEN PELVIS WO CONTRAST  Result Date: 12/08/2020 CLINICAL DATA:  Cancer of unknown primary in this 60 year old male. EXAM: CT CHEST, ABDOMEN AND PELVIS WITHOUT CONTRAST TECHNIQUE: Multidetector CT imaging of the chest, abdomen and pelvis was performed following the standard protocol without IV contrast. COMPARISON:  CT abdomen and pelvis of the same date. Also with CT of December 30th of 2021 FINDINGS: CT CHEST FINDINGS Cardiovascular: Calcified atheromatous plaque in the thoracic aorta. Heart size is normal following sternotomy for heart transplant by report a [facility] center. Three-vessel coronary artery calcification. No pericardial effusion. Calcifications in the LEFT atrium partially seen on previous imaging are of uncertain significance central pulmonary vasculature also with some signs of calcification. Mediastinum/Nodes: Ovoid structure associated with LEFT thyroid 11 mm similar to previous imaging, cervical spine from 2017. Thyroid as described above. Calcifications in the LEFT supraclavicular region may be  within venous structures on the LEFT. No axillary lymphadenopathy. Surgical clips in the RIGHT axilla and soft tissue thickening over the LEFT pectoralis musculature similar grossly compared to remote MRI evaluation 2018 and surgical clips seen on numerous priors in the RIGHT axilla. No mediastinal lymphadenopathy. No gross hilar  lymphadenopathy. Lungs/Pleura: Small RIGHT of pleural effusion and basilar airspace disease. Small nodule in the superior aspect of the RIGHT middle lobe approximately 3 mm on image 57 of series 5. Biapical scarring. Small nodule in the LEFT upper lobe 6 mm (image 51, series 5) airways are patent. Musculoskeletal: Evidence ir sternotomy without significant bony bridging but with sclerotic margins along the sternotomy line similar to studies dating back to August of 2021 with respect to visualized portions on prior abdomen studies no destructive bone finding or acute bone process. CT ABDOMEN PELVIS FINDINGS Hepatobiliary: 1.9 cm lesion in the RIGHT hepatic lobe near the dome of the RIGHT hemi liver does not display attenuation values of simple cyst is unchanged from the study acquired on the same date at another facility. Additionally a 1.4 cm lesion in the RIGHT hepatic lobe on image 23 of series 2 is unchanged. Sludge in the gallbladder. Tiny low-density foci in the LEFT hepatic lobe without change 1 on image 19 of series 2 in the lateral segment measuring 5 mm and another in the medial segment of the LEFT hepatic lobe measuring 12 mm on image 21 of series 2 these areas appears similar dating back to August of 2020, lesions in the RIGHT hepatic lobe are new. Pancreas: Normal pancreatic contour without signs of inflammation. Spleen: Post splenectomy. Adrenals/Urinary Tract: Adrenal glands, normal on the LEFT and obscured on the RIGHT by perinephric and Peri adrenal nodularity. Fullness of the RIGHT renal pelvis with nephroureteral stent in place showing soft tissue density with distension of the renal pelvis and calices similar to the study acquired on the same date. Nephrolithiasis also unchanged. Perinephric nodularity along the medial upper pole the LEFT kidney measures 6.4 x 2.7 cm which is within 1-2 mm of its previous measurement when measured by this observer on the prior study. Increased in size when  compared to the study of November 30, 2020. Renal cortical scarring on the LEFT with nephrolithiasis in the lower pole unchanged from very recent comparison. Urinary bladder with small amount of gas in the urinary bladder with distal aspect of the stent in place, loop coiled in the urinary bladder. Stomach/Bowel: Scattered colonic diverticulosis without acute gastrointestinal process. Normal appendix. Stool and contrast throughout the colon. Vascular/Lymphatic: Calcified atheromatous plaque in the abdominal aorta without aneurysmal dilation. Retroperitoneal adenopathy unchanged from scan earlier the same day, largest lymph nodes behind the inferior vena cava and in the intra-aortocaval groove and in the upper abdomen, largest on image 41 of series 2 measuring 1.9 cm. Smaller lymph nodes along the LEFT periaortic chain. Postoperative changes in the retroperitoneum associated with previous lymphadenectomy No pelvic lymphadenopathy. Reproductive: Prostate with uro lift device deployment bilaterally similar to recent priors. Other: Extensive retroperitoneal fascial thickening/stranding extending into the root of the small bowel mesentery, nodular changes associated with this thickening on the RIGHT outlining the renal fascia this crosses the midline where it is without significant nodularity but with fascial thickening and stranding, also tracking into the pelvis. Small amount of fluid in the pelvis. Musculoskeletal: Spinal degenerative changes. No destructive bone process or acute bone finding. IMPRESSION: 1. Retroperitoneal adenopathy and perinephric nodularity with dilation of the RIGHT renal pelvis and collecting systems,  filled with what is suspected to represent neoplasm. 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 Differential considerations given heart transplantation and presumed immunosuppression would include  posttransplant lymphoproliferative disorder/lymphoma. 2. Dense material in collecting systems may represent a mixture of tumor and or blood and there is profound dilation of collecting systems of the RIGHT kidney. This is unchanged compared to the recent comparison study. 3. Signs of hepatic involvement with new lesions in the RIGHT hepatic lobe. 4. Ascites with small amount of ascites with very subtle infiltration of fat in the omentum not mentioned above, for instance on image 50 of series 2 raising the question of peritoneal involvement as well. This area is in the range of 2-3 mm. 5. Small RIGHT pleural effusion and basilar airspace disease. 6. Postoperative changes about the chest likely related to prior heart transplantation with surgical clips in the RIGHT axilla and thickening and calcification along the anterior pectoralis musculature not changed since 2018. 7. Calcification in the LEFT atrium also about the aortic root and pulmonary artery and to a lesser degree in the RIGHT atrium likely reflects changes of vascular anastomotic sites in the setting of heart transplantation. 8. 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 9. Post splenectomy. 10. Aortic atherosclerosis. 11. Small nodule in the LEFT upper lobe 6 mm airways are patent. These results will be called to the ordering clinician or representative by the Radiologist Assistant, and communication documented in the PACS or [employer]. Aortic Atherosclerosis (ICD10-I70.0). Electronically Signed   By: Zetta Bills M.D.   On: 12/13/2020 17:38   DG Chest 1 View  Result Date: 11/29/2020 CLINICAL DATA:  Hypoxia EXAM: CHEST  1 VIEW COMPARISON:  08/03/2020 FINDINGS: Since the prior examination, there has developed right basilar consolidation, possibly infectious in the appropriate clinical setting. Mild focal infiltrate is also noted within the right apex. Left lung is  clear. No pneumothorax or pleural effusion. Median sternotomy has been performed. Cardiac size within normal limits. Multiple healed right rib fractures are noted. Multiple surgical clips are seen within the right axilla. IMPRESSION: Interval development of multifocal pulmonary infiltrate within the right lung, more focal within the right lung base, suspicious for atypical infection in the acute setting. Electronically Signed   By: Fidela Salisbury MD   On: 11/29/2020 22:27   DG Chest 2 View  Result Date: 12/30/2020 CLINICAL DATA:  Bladder cancer.  Follow-up multifocal pneumonia. EXAM: CHEST - 2 VIEW COMPARISON:  11/29/2020 FINDINGS: Prior median sternotomy. Heart is normal size. Patchy right lung airspace disease in the right apex and right lower lung. Minimal left base linear atelectasis or scarring. Overall aeration in the right lung slightly improved. No effusions. IMPRESSION: Patchy right lung airspace disease with slight improvement since prior study. Left base atelectasis or scarring. Electronically Signed   By: Rolm Baptise M.D.   On: 12/25/2020 13:25   DG Abd 1 View  Result Date: 12/20/2020 CLINICAL DATA:  Ureteral stent placement EXAM: ABDOMEN - 1 VIEW COMPARISON:  CT 12/15/2020 FINDINGS: Double-J LEFT ureteral stent in place. No renal or ureteral calculi identified. Normal bowel-gas pattern. IMPRESSION: RIGHT ureteral stent in place Electronically Signed   By: Suzy Bouchard M.D.   On: 12/20/2020 15:30   CT CHEST WO CONTRAST  Result Date: 12/09/2020 CLINICAL DATA:  Cancer of unknown primary in this 60 year old male. EXAM: CT CHEST, ABDOMEN AND PELVIS WITHOUT  CONTRAST TECHNIQUE: Multidetector CT imaging of the chest, abdomen and pelvis was performed following the standard protocol without IV contrast. COMPARISON:  CT abdomen and pelvis of the same date. Also with CT of December 30th of 2021 FINDINGS: CT CHEST FINDINGS Cardiovascular: Calcified atheromatous plaque in the thoracic aorta. Heart size  is normal following sternotomy for heart transplant by report a [facility] center. Three-vessel coronary artery calcification. No pericardial effusion. Calcifications in the LEFT atrium partially seen on previous imaging are of uncertain significance central pulmonary vasculature also with some signs of calcification. Mediastinum/Nodes: Ovoid structure associated with LEFT thyroid 11 mm similar to previous imaging, cervical spine from 2017. Thyroid as described above. Calcifications in the LEFT supraclavicular region may be within venous structures on the LEFT. No axillary lymphadenopathy. Surgical clips in the RIGHT axilla and soft tissue thickening over the LEFT pectoralis musculature similar grossly compared to remote MRI evaluation 2018 and surgical clips seen on numerous priors in the RIGHT axilla. No mediastinal lymphadenopathy. No gross hilar lymphadenopathy. Lungs/Pleura: Small RIGHT of pleural effusion and basilar airspace disease. Small nodule in the superior aspect of the RIGHT middle lobe approximately 3 mm on image 57 of series 5. Biapical scarring. Small nodule in the LEFT upper lobe 6 mm (image 51, series 5) airways are patent. Musculoskeletal: Evidence ir sternotomy without significant bony bridging but with sclerotic margins along the sternotomy line similar to studies dating back to August of 2021 with respect to visualized portions on prior abdomen studies no destructive bone finding or acute bone process. CT ABDOMEN PELVIS FINDINGS Hepatobiliary: 1.9 cm lesion in the RIGHT hepatic lobe near the dome of the RIGHT hemi liver does not display attenuation values of simple cyst is unchanged from the study acquired on the same date at another facility. Additionally a 1.4 cm lesion in the RIGHT hepatic lobe on image 23 of series 2 is unchanged. Sludge in the gallbladder. Tiny low-density foci in the LEFT hepatic lobe without change 1 on image 19 of series 2 in the lateral segment measuring  5 mm and another in the medial segment of the LEFT hepatic lobe measuring 12 mm on image 21 of series 2 these areas appears similar dating back to August of 2020, lesions in the RIGHT hepatic lobe are new. Pancreas: Normal pancreatic contour without signs of inflammation. Spleen: Post splenectomy. Adrenals/Urinary Tract: Adrenal glands, normal on the LEFT and obscured on the RIGHT by perinephric and Peri adrenal nodularity. Fullness of the RIGHT renal pelvis with nephroureteral stent in place showing soft tissue density with distension of the renal pelvis and calices similar to the study acquired on the same date. Nephrolithiasis also unchanged. Perinephric nodularity along the medial upper pole the LEFT kidney measures 6.4 x 2.7 cm which is within 1-2 mm of its previous measurement when measured by this observer on the prior study. Increased in size when compared to the study of November 30, 2020. Renal cortical scarring on the LEFT with nephrolithiasis in the lower pole unchanged from very recent comparison. Urinary bladder with small amount of gas in the urinary bladder with distal aspect of the stent in place, loop coiled in the urinary bladder. Stomach/Bowel: Scattered colonic diverticulosis without acute gastrointestinal process. Normal appendix. Stool and contrast throughout the colon. Vascular/Lymphatic: Calcified atheromatous plaque in the abdominal aorta without aneurysmal dilation. Retroperitoneal adenopathy unchanged from scan earlier the same day, largest lymph nodes behind the inferior vena cava and in the intra-aortocaval groove and in the upper abdomen, largest  on image 41 of series 2 measuring 1.9 cm. Smaller lymph nodes along the LEFT periaortic chain. Postoperative changes in the retroperitoneum associated with previous lymphadenectomy No pelvic lymphadenopathy. Reproductive: Prostate with uro lift device deployment bilaterally similar to recent priors. Other: Extensive retroperitoneal fascial  thickening/stranding extending into the root of the small bowel mesentery, nodular changes associated with this thickening on the RIGHT outlining the renal fascia this crosses the midline where it is without significant nodularity but with fascial thickening and stranding, also tracking into the pelvis. Small amount of fluid in the pelvis. Musculoskeletal: Spinal degenerative changes. No destructive bone process or acute bone finding. IMPRESSION: 1. Retroperitoneal adenopathy and perinephric nodularity with dilation of the RIGHT renal pelvis and collecting systems, filled with what is suspected to represent neoplasm. 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 Differential considerations given heart transplantation and presumed immunosuppression would include posttransplant lymphoproliferative disorder/lymphoma. 2. Dense material in collecting systems may represent a mixture of tumor and or blood and there is profound dilation of collecting systems of the RIGHT kidney. This is unchanged compared to the recent comparison study. 3. Signs of hepatic involvement with new lesions in the RIGHT hepatic lobe. 4. Ascites with small amount of ascites with very subtle infiltration of fat in the omentum not mentioned above, for instance on image 50 of series 2 raising the question of peritoneal involvement as well. This area is in the range of 2-3 mm. 5. Small RIGHT pleural effusion and basilar airspace disease. 6. Postoperative changes about the chest likely related to prior heart transplantation with surgical clips in the RIGHT axilla and thickening and calcification along the anterior pectoralis musculature not changed since 2018. 7. Calcification in the LEFT atrium also about the aortic root and pulmonary artery and to a lesser degree in the RIGHT atrium likely reflects changes of vascular anastomotic sites in the setting of  heart transplantation. 8. 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 9. Post splenectomy. 10. Aortic atherosclerosis. 11. Small nodule in the LEFT upper lobe 6 mm airways are patent. These results will be called to the ordering clinician or representative by the Radiologist Assistant, and communication documented in the PACS or [employer]. Aortic Atherosclerosis (ICD10-I70.0). Electronically Signed   By: Zetta Bills M.D.   On: 12/24/2020 17:38   NM Pulmonary Perfusion  Result Date: 12/08/2020 CLINICAL DATA:  Respiratory failure EXAM: NUCLEAR MEDICINE PERFUSION LUNG SCAN TECHNIQUE: Perfusion images were obtained in multiple projections after intravenous injection of radiopharmaceutical. Ventilation scans intentionally deferred if perfusion scan and chest x-ray adequate for interpretation during COVID 19 epidemic. RADIOPHARMACEUTICALS:  4.1 mCi Tc-61mMAA IV COMPARISON:  Chest x-ray 12/08/2020 FINDINGS: Slightly heterogeneous distribution of radiotracer within the lung fields. Multiple small bilateral wedge-shaped perfusion defects. No corresponding radiographic abnormality. No large mismatched segmental perfusion defect. IMPRESSION: Intermediate probability for pulmonary embolism. Electronically Signed   By: NDavina PokeD.O.   On: 12/20/2020 13:30   [place]  Result Date: 12/20/2020 CLINICAL DATA:  Urinary retention. 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 EXAM: RENAL / URINARY TRACT ULTRASOUND COMPLETE COMPARISON:  CT 12/31/2020 FINDINGS: Right Kidney: Renal measurements: 14.4 x 9.2 x 7.9 cm = volume: 547 mL. Normal echogenicity. Mild right hydronephrosis, relatively similar to 12/15/2020 CT. Left Kidney: Renal measurements: 12.5 x 3.5 x 6.4 cm = volume: 231 mL. Echogenicity within normal limits. No mass or hydronephrosis visualized.  Bladder: Echogenicity within the posterior bladder is likely due to the right  ureteric stent. Other: Mild exam degradation secondary to patient's tenderness. IMPRESSION: 1. Mild right-sided hydronephrosis, relatively similar to 12/31/2020 CT. 2. No other explanation for urinary retention. Electronically Signed   By: Abigail Miyamoto M.D.   On: 12/20/2020 11:18   IR Fluoro Guide CV Line Left  Result Date: 12/17/2020 INDICATION: 60 year old male referred for hemodialysis catheter placement EXAM: TUNNELED CENTRAL VENOUS HEMODIALYSIS CATHETER PLACEMENT WITH ULTRASOUND AND FLUOROSCOPIC GUIDANCE MEDICATIONS: 1 g vancomycin. The antibiotic was given in an appropriate time interval prior to skin puncture. ANESTHESIA/SEDATION: No moderate sedation. A total of Versed 0.5 mg and Fentanyl 0 mcg was administered intravenously. Moderate Sedation Time: 0 minutes. The patient's level of consciousness and vital signs were monitored continuously by radiology nursing throughout the procedure under my direct supervision. FLUOROSCOPY TIME:  Fluoroscopy Time: 0 minutes 54 seconds (3 mGy). COMPLICATIONS: None PROCEDURE: Informed written consent was obtained from the patient after a discussion of the risks, benefits, and alternatives to treatment. Questions regarding the procedure were encouraged and answered. The right neck and chest were prepped with chlorhexidine in a sterile fashion, and a sterile drape was applied covering the operative field. Maximum barrier sterile technique with sterile gowns and gloves were used for the procedure. A timeout was performed prior to the initiation of the procedure. Ultrasound survey was performed. Micropuncture kit was utilized to access the right internal jugular vein under direct, real-time ultrasound guidance after the overlying soft tissues were anesthetized with 1% lidocaine with epinephrine. Stab incision was made with 11 blade scalpel. Microwire was passed centrally. The microwire was then marked to measure appropriate internal catheter length. External tunneled length  was estimated. A total tip to cuff length of 19 cm was selected. 035 guidewire was advanced to the level of the IVC. Skin and subcutaneous tissues of chest wall below the clavicle were generously infiltrated with 1% lidocaine for local anesthesia. A small stab incision was made with 11 blade scalpel. The selected hemodialysis catheter was tunneled in a retrograde fashion from the anterior chest wall to the venotomy incision. Serial dilation was performed and then a peel-away sheath was placed. The catheter was then placed through the peel-away sheath with tips ultimately positioned within the superior aspect of the right atrium. Final catheter positioning was confirmed and documented with a spot radiographic image. The catheter aspirates and flushes normally. The catheter was flushed with appropriate volume heparin dwells. The catheter exit site was secured with a 0-Prolene retention suture. Gel-Foam slurry was infused into the soft tissue tract. The venotomy incision was closed Derma bond and sterile dressing. Dressings were applied at the chest wall. Patient tolerated the procedure well and remained hemodynamically stable throughout. No complications were encountered and no significant blood loss encountered. IMPRESSION: Status post right IJ tunneled hemodialysis catheter placement. Catheter ready for use. Signed, Dulcy Fanny. Dellia Nims, RPVI [facility] Radiology Electronically Signed   By: Corrie Mckusick D.O.   On: 12/17/2020 15:26   IR US Guide Vasc Access Left  Result Date: 12/17/2020 INDICATION: 60 year old male referred for hemodialysis catheter placement EXAM: TUNNELED CENTRAL VENOUS HEMODIALYSIS CATHETER PLACEMENT WITH ULTRASOUND AND FLUOROSCOPIC GUIDANCE MEDICATIONS: 1 g vancomycin. The antibiotic was given in an appropriate time interval prior to skin puncture. ANESTHESIA/SEDATION: No moderate sedation. A total of Versed 0.5 mg and Fentanyl 0 mcg was  administered intravenously. Moderate Sedation Time: 0 minutes. The patient's level of consciousness and vital  signs were monitored continuously by radiology nursing throughout the procedure under my direct supervision. FLUOROSCOPY TIME:  Fluoroscopy Time: 0 minutes 54 seconds (3 mGy). COMPLICATIONS: None PROCEDURE: Informed written consent was obtained from the patient after a discussion of the risks, benefits, and alternatives to treatment. Questions regarding the procedure were encouraged and answered. The right neck and chest were prepped with chlorhexidine in a sterile fashion, and a sterile drape was applied covering the operative field. Maximum barrier sterile technique with sterile gowns and gloves were used for the procedure. A timeout was performed prior to the initiation of the procedure. Ultrasound survey was performed. Micropuncture kit was utilized to access the right internal jugular vein under direct, real-time ultrasound guidance after the overlying soft tissues were anesthetized with 1% lidocaine with epinephrine. Stab incision was made with 11 blade scalpel. Microwire was passed centrally. The microwire was then marked to measure appropriate internal catheter length. External tunneled length was estimated. A total tip to cuff length of 19 cm was selected. 035 guidewire was advanced to the level of the IVC. Skin and subcutaneous tissues of chest wall below the clavicle were generously infiltrated with 1% lidocaine for local anesthesia. A small stab incision was made with 11 blade scalpel. The selected hemodialysis catheter was tunneled in a retrograde fashion from the anterior chest wall to the venotomy incision. Serial dilation was performed and then a peel-away sheath was placed. The catheter was then placed through the peel-away sheath with tips ultimately positioned within the superior aspect of the right atrium. Final catheter positioning was confirmed and documented with a spot radiographic  image. The catheter aspirates and flushes normally. The catheter was flushed with appropriate volume heparin dwells. The catheter exit site was secured with a 0-Prolene retention suture. Gel-Foam slurry was infused into the soft tissue tract. The venotomy incision was closed Derma bond and sterile dressing. Dressings were applied at the chest wall. Patient tolerated the procedure well and remained hemodynamically stable throughout. No complications were encountered and no significant blood loss encountered. IMPRESSION: Status post right IJ tunneled hemodialysis catheter placement. Catheter ready for use. Signed, Dulcy Fanny. Dellia Nims, RPVI [facility] Radiology Electronically Signed   By: Corrie Mckusick D.O.   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 ANESTHESIA/SEDATION: Fentanyl 100 mcg IV; Versed 4 mg IV Sedation time: 14 minutes; The patient was continuously monitored during the procedure by the interventional radiology nurse under my direct supervision. CONTRAST:  None. COMPLICATIONS: SIR Level A - No therapy, no consequence. Retroperitoneal hemorrhage at site of biopsy, controlled with Gel-Foam slurry administration along needle track. PROCEDURE: Informed consent was obtained from the patient following an explanation of the procedure, risks, benefits and alternatives. A time out was performed prior to the initiation of the procedure. The patient was positioned in a partial left lateral decubitus position on the CT table and a limited CT was performed for procedural planning demonstrating similar appearing multifocal right retroperitoneal lymphadenopathy. The procedure was planned. The operative site was prepped and draped in the usual  sterile fashion. Appropriate trajectory was confirmed with a 22 gauge spinal needle after the adjacent tissues were anesthetized with 1% Lidocaine with epinephrine. Under intermittent CT guidance, a 17 gauge coaxial needle was advanced into the peripheral aspect of the mass. Appropriate positioning was confirmed and a  total of 4 samples were obtained with an 18 gauge core needle biopsy device. A limited CT demonstrated focal hemorrhage about the biopsied lymph node. Gel-Foam slurry was injected through the introducer needle along the needle track. The co-axial needle was removed and hemostasis was achieved with manual compression. Additional limited postprocedural CT was negative for expanding hemorrhage or additional complication. A dressing was placed. The patient tolerated the procedure well without immediate postprocedural complication. IMPRESSION: Technically successful CT guided core needle biopsy of right retroperitoneal lymph node. Ruthann Cancer, MD [facility] Radiology Electronically Signed   By: Ruthann Cancer MD   On: 12/07/2020 08:53   DG CHEST PORT 1 VIEW  Result Date: 12/10/2020 CLINICAL DATA:  Hypoxia.  History of cardiac transplant EXAM: PORTABLE CHEST 1 VIEW COMPARISON:  12/20/2020 FINDINGS: Prior median sternotomy. Heart size within normal limits. Atherosclerotic calcification of the aortic knob. Subtle right lower lobe opacity, slightly improved from prior. Trace right pleural effusion. No pneumothorax. IMPRESSION: 1. Subtle right lower lobe opacity, slightly improved from prior. 2. Trace right pleural effusion. Electronically Signed   By: Davina Poke D.O.   On: 12/10/2020 10:33   DG C-Arm 1-60 Min-No Report  Result Date: 11/29/2020 Fluoroscopy was utilized by the requesting physician.  No radiographic interpretation.   ECHOCARDIOGRAM COMPLETE  Result Date: 11/30/2020    ECHOCARDIOGRAM REPORT   Patient Name:   ANTHONYJAMES BARGAR Fryberger  Date of Exam: 11/30/2020 Medical Rec #:  [id number]         Height:       74.0 in Accession #:    [id number]        Weight:       221.0 lb Date of Birth:  Sep 09, 1961        BSA:          2.268 m Patient Age:    85 years          BP:           103/59 mmHg Patient Gender: M                 HR:           94 bpm. Exam Location:  Inpatient Procedure: 2D Echo, Cardiac Doppler, Color Doppler and Intracardiac            Opacification Agent Indications:    Dyspnea R06.00  History:        Patient has prior history of Echocardiogram examinations, most                 recent 05/12/2017. Arrythmias:Atrial Fibrillation; Risk                 Factors:Non-Smoker. GERD. 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  Sonographer:    Vickie Epley RDCS Referring Phys: [id number] [facility]  Sonographer Comments: Suboptimal apical window and suboptimal subcostal window. Pt unable to turn on side due to herniated discs. IMPRESSIONS  1. Left ventricular ejection fraction, by estimation, is 60 to 65%. The left ventricle has normal function. The left ventricle has no regional wall motion abnormalities. Left ventricular diastolic parameters were normal.  2. Right ventricular systolic function is normal. The right ventricular size is normal. There is normal pulmonary artery systolic pressure. The estimated right ventricular systolic pressure is 91.6 mmHg.  3. The mitral valve is normal in structure. No evidence of mitral valve regurgitation. No evidence of mitral stenosis.  4. The aortic valve is normal in structure. Aortic valve regurgitation is  not visualized. No aortic stenosis is present.  5. The inferior vena cava is normal in size with greater than 50% respiratory variability, suggesting right atrial pressure of 3 mmHg. FINDINGS  Left Ventricle: Left ventricular ejection fraction, by estimation, is 60 to 65%. The left ventricle has normal function. The left ventricle has no regional wall motion abnormalities. Definity contrast agent was given IV to  delineate the left ventricular  endocardial borders. The left ventricular internal cavity size was normal in size. There is no left ventricular hypertrophy. Left ventricular diastolic parameters were normal. Normal left ventricular filling pressure. Right Ventricle: The right ventricular size is normal. No increase in right ventricular wall thickness. Right ventricular systolic function is normal. There is normal pulmonary artery systolic pressure. The tricuspid regurgitant velocity is 2.39 m/s, and  with an assumed right atrial pressure of 3 mmHg, the estimated right ventricular systolic pressure is 40.9 mmHg. Left Atrium: Left atrial size was normal in size. Right Atrium: Right atrial size was normal in size. Pericardium: There is no evidence of pericardial effusion. Mitral Valve: The mitral valve is normal in structure. No evidence of mitral valve regurgitation. No evidence of mitral valve stenosis. Tricuspid Valve: The tricuspid valve is normal in structure. Tricuspid valve regurgitation is mild . No evidence of tricuspid stenosis. Aortic Valve: The aortic valve is normal in structure. Aortic valve regurgitation is not visualized. No aortic stenosis is present. Pulmonic Valve: The pulmonic valve was normal in structure. Pulmonic valve regurgitation is not visualized. No evidence of pulmonic stenosis. Aorta: The aortic root is normal in size and structure. Venous: The inferior vena cava is normal in size with greater than 50% respiratory variability, suggesting right atrial pressure of 3 mmHg. IAS/Shunts: No atrial level shunt detected by color flow Doppler.  LEFT VENTRICLE PLAX 2D LVIDd:         4.40 cm     Diastology LVIDs:         3.20 cm     LV e' medial:    10.00 cm/s LV PW:         0.90 cm     LV E/e' medial:  7.5 LV IVS:        0.90 cm     LV e' lateral:   11.30 cm/s LVOT diam:     2.50 cm     LV E/e' lateral: 6.7 LV SV:         48 LV SV Index:   21 LVOT Area:     4.91 cm  LV Volumes (MOD) LV vol d, MOD  A4C: 80.2 ml LV vol s, MOD A4C: 30.4 ml LV SV MOD A4C:     80.2 ml LEFT ATRIUM           Index LA diam:      2.70 cm 1.19 cm/m LA Vol (A4C): 16.3 ml 7.19 ml/m  AORTIC VALVE LVOT Vmax:   50.20 cm/s LVOT Vmean:  36.700 cm/s LVOT VTI:    0.099 m  AORTA Ao Root diam: 3.40 cm MITRAL VALVE               TRICUSPID VALVE MV Area (PHT): 5.38 cm    TR Peak grad:   22.8 mmHg MV Decel Time: 141 msec    TR Vmax:        239.00 cm/s MV E velocity: 75.40 cm/s MV A velocity: 36.00 cm/s  SHUNTS MV E/A ratio:  2.09        Systemic VTI:  0.10 m                            Systemic Diam: 2.50 cm Sanda Klein MD Electronically signed by Sanda Klein MD Signature Date/Time: 11/30/2020/12:30:40 PM    Final    VAS [place] LOWER EXTREMITY VENOUS (DVT)  Result Date: 12/10/2020  Lower Venous DVT Study Indications: Pain.  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 Comparison Study: Previous 12/02/20 Negative Performing Technologist: Vonzell Schlatter RVT  Examination Guidelines: A complete evaluation includes B-mode imaging, spectral Doppler, color Doppler, and power Doppler as needed of all accessible portions of each vessel. Bilateral testing is considered an integral part of a complete examination. Limited examinations for reoccurring indications may be performed as noted. The reflux portion of the exam is performed with the patient in reverse Trendelenburg.  +---------+---------------+---------+-----------+----------+--------------+ RIGHT    CompressibilityPhasicitySpontaneityPropertiesThrombus Aging +---------+---------------+---------+-----------+----------+--------------+ CFV      Full           Yes      Yes                                 +---------+---------------+---------+-----------+----------+--------------+ SFJ      Full                                                        +---------+---------------+---------+-----------+----------+--------------+ FV Prox  Full                                                         +---------+---------------+---------+-----------+----------+--------------+ FV Mid   Full                                                        +---------+---------------+---------+-----------+----------+--------------+ FV DistalFull                                                        +---------+---------------+---------+-----------+----------+--------------+ PFV      Full                                                        +---------+---------------+---------+-----------+----------+--------------+ POP      Full           Yes      Yes                                 +---------+---------------+---------+-----------+----------+--------------+ PTV      None                                                        +---------+---------------+---------+-----------+----------+--------------+  PERO     None                                                        +---------+---------------+---------+-----------+----------+--------------+   +---------+---------------+---------+-----------+----------+--------------+ LEFT     CompressibilityPhasicitySpontaneityPropertiesThrombus Aging +---------+---------------+---------+-----------+----------+--------------+ CFV      Full           Yes      Yes                                 +---------+---------------+---------+-----------+----------+--------------+ SFJ      Full                                                        +---------+---------------+---------+-----------+----------+--------------+ FV Prox  Full                                                        +---------+---------------+---------+-----------+----------+--------------+ FV Mid   Full                                                        +---------+---------------+---------+-----------+----------+--------------+ FV DistalFull                                                         +---------+---------------+---------+-----------+----------+--------------+ PFV      Full                                                        +---------+---------------+---------+-----------+----------+--------------+ POP      Full           Yes      Yes                                 +---------+---------------+---------+-----------+----------+--------------+ PTV      Full                                                        +---------+---------------+---------+-----------+----------+--------------+ PERO     Full                                                        +---------+---------------+---------+-----------+----------+--------------+  Summary: RIGHT: - Findings consistent with acute deep vein thrombosis involving one right posterior tibial vein, and both right peroneal veins. - No cystic structure found in the popliteal fossa.  LEFT: - There is no evidence of deep vein thrombosis in the lower extremity.  - No cystic structure found in the popliteal fossa.  *See table(s) above for measurements and observations. Electronically signed by Jamelle Haring on 12/10/2020 at 7:16:00 PM.    Final    VAS [place] LOWER EXTREMITY VENOUS (DVT)  Result Date: 12/03/2020  Lower Venous DVT Study Indications: Pain.  Comparison Study: Previous 12/2019 Performing Technologist: Vonzell Schlatter RVT  Examination Guidelines: A complete evaluation includes B-mode imaging, spectral Doppler, color Doppler, and power Doppler as needed of all accessible portions of each vessel. Bilateral testing is considered an integral part of a complete examination. Limited examinations for reoccurring indications may be performed as noted. The reflux portion of the exam is performed with the patient in reverse Trendelenburg.  +---------+---------------+---------+-----------+----------+--------------+ RIGHT    CompressibilityPhasicitySpontaneityPropertiesThrombus Aging  +---------+---------------+---------+-----------+----------+--------------+ CFV      Full           Yes      Yes                                 +---------+---------------+---------+-----------+----------+--------------+ SFJ      Full                                                        +---------+---------------+---------+-----------+----------+--------------+ FV Prox  Full                                                        +---------+---------------+---------+-----------+----------+--------------+ FV Mid   Full                                                        +---------+---------------+---------+-----------+----------+--------------+ FV DistalFull                                                        +---------+---------------+---------+-----------+----------+--------------+ PFV      Full                                                        +---------+---------------+---------+-----------+----------+--------------+ POP      Full           Yes      Yes                                 +---------+---------------+---------+-----------+----------+--------------+ PTV  Full                                                        +---------+---------------+---------+-----------+----------+--------------+ PERO     Full                                                        +---------+---------------+---------+-----------+----------+--------------+   +---------+---------------+---------+-----------+----------+--------------+ LEFT     CompressibilityPhasicitySpontaneityPropertiesThrombus Aging +---------+---------------+---------+-----------+----------+--------------+ CFV      Full           Yes      Yes                                 +---------+---------------+---------+-----------+----------+--------------+ SFJ      Full                                                         +---------+---------------+---------+-----------+----------+--------------+ FV Prox  Full                                                        +---------+---------------+---------+-----------+----------+--------------+ FV Mid   Full                                                        +---------+---------------+---------+-----------+----------+--------------+ FV DistalFull                                                        +---------+---------------+---------+-----------+----------+--------------+ PFV      Full                                                        +---------+---------------+---------+-----------+----------+--------------+ POP      Full           Yes      Yes                                 +---------+---------------+---------+-----------+----------+--------------+ PTV      Full                                                        +---------+---------------+---------+-----------+----------+--------------+  PERO     Full                                                        +---------+---------------+---------+-----------+----------+--------------+     Summary: RIGHT: - There is no evidence of deep vein thrombosis in the lower extremity.  - No cystic structure found in the popliteal fossa.  LEFT: - There is no evidence of deep vein thrombosis in the lower extremity.  - No cystic structure found in the popliteal fossa.  *See table(s) above for measurements and observations. Electronically signed by Ruta Hinds MD on 12/03/2020 at 12:15:41 PM.    Final    ECHOCARDIOGRAM LIMITED  Result Date: 12/10/2020    ECHOCARDIOGRAM LIMITED REPORT   Patient Name:   JACKSEN ISIP Milleson Date of Exam: 12/10/2020 Medical Rec #:  [id number]         Height:       74.0 in Accession #:    [id number]        Weight:       230.4 lb Date of Birth:  November 07, 1961        BSA:          2.308 m Patient Age:    71 years          BP:           105/72 mmHg Patient Gender: M                  HR:           99 bpm. Exam Location:  Inpatient Procedure: Limited Echo, Cardiac Doppler and Color Doppler Indications:    Acute DVT  History:        Patient has prior history of Echocardiogram examinations, most                 recent 11/30/2020. Arrythmias:Atrial Fibrillation. CKD. GERD.                 S/P heart transplant 01/22/16.  Sonographer:    Clayton Lefort RDCS (AE) Referring Phys: [id number] [facility] NETTE[facility]  Sonographer Comments: Technically difficult study due to poor echo windows, suboptimal apical window and suboptimal subcostal window. Unable to roll patient due to herniated discs. IMPRESSIONS  1. S/P Heart Transplant. Left ventricular ejection fraction, by estimation, is 60 to 65%. The left ventricle has normal function. Left ventricular endocardial border not optimally defined to evaluate regional wall motion. There is moderate concentric left ventricular hypertrophy. Left ventricular diastolic function could not be evaluated.  2. Right ventricular systolic function is moderately reduced. The right ventricular size is mildly enlarged. Tricuspid regurgitation signal is inadequate for assessing PA pressure.  3. A small pericardial effusion is present. The pericardial effusion is surrounding the apex.  4. Tricuspid valve regurgitation is mild to moderate. FINDINGS  Left Ventricle: S/P Heart Transplant. Left ventricular ejection fraction, by estimation, is 60 to 65%. The left ventricle has normal function. Left ventricular endocardial border not optimally defined to evaluate regional wall motion. There is moderate concentric left ventricular hypertrophy. Left ventricular diastolic function could not be evaluated. Right Ventricle: The right ventricular size is mildly enlarged. Right ventricular systolic function is moderately reduced. Tricuspid regurgitation signal is inadequate for assessing PA pressure. Pericardium: A small pericardial effusion is present. The pericardial effusion is surrounding  the apex. Tricuspid Valve: Tricuspid valve regurgitation is mild to moderate. Venous: The inferior vena cava was not well visualized. 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  Molecular studies pending. -12/04/2020 CT chest/abdomen/pelvis without contrast showed "1. Retroperitoneal adenopathy and perinephric nodularity with dilation of the RIGHT renal pelvis and collecting systems, filled with what is suspected to represent neoplasm. 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 Differential considerations given heart transplantation and presumed immunosuppression would include posttransplant lymphoproliferative disorder/lymphoma. 2. Dense material in collecting systems may represent a mixture of tumor and or blood and there is profound dilation of collecting systems of the RIGHT kidney. This is unchanged compared to the recent comparison study. 3. Signs of hepatic involvement with new lesions in the RIGHT hepatic lobe. 4. Ascites with small amount of ascites with very subtle infiltration of fat in the omentum not mentioned above, for instance on image 50 of series 2 raising the question of peritoneal involvement as well. This area is in the range of 2-3 mm. 5. Small RIGHT pleural effusion and basilar airspace  disease. 6. Postoperative changes about the chest likely related to prior heart transplantation with surgical clips in the RIGHT axilla and thickening and calcification along the anterior pectoralis musculature not changed since 2018. 7. Calcification in the LEFT atrium also about the aortic root and pulmonary artery and to a lesser degree in the RIGHT atrium likely reflects changes of vascular anastomotic sites in the setting of heart transplantation. 8. 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 9. Post splenectomy. 10. Aortic atherosclerosis. 11. Small nodule in the LEFT upper lobe 6 mm airways are patent."  2) Back pain secondary to #1  3) Hyponatremia  4) Leukocytosis and Thrombocytosis  5) CKD  6) High-grade T1 bladder cancer  7) History of Hodgkin's lymphoma  8) status post heart transplant  PLAN: -Current labs have been reviewed.  Will order repeat CBC with differential an CMET tomorrow morning.  The patient is scheduled for day 15 of cycle one of his chemotherapy on 12/28/2020.  Okay to proceed with chemotherapy based on current labs.  Aware of abnormal renal function and adequate to proceed.  Day 15 is due on 12/28/2020.   -PD-L1 testing negative. -Request for second opinion at [facility] has been sent.  Awaiting appointment to be arranged. -He is aware that this is an incurable condition. -Continue hemodialysis per nephrology. -Recommend ongoing care by the palliative care team to assist  with goals of care discussion and pain management. -Continue IV heparin for lower extremity DVT which was likely triggered by malignancy and immobility.  If renal function stabilizes, can transition to Lovenox 1 mg/kg once daily and check Xa levels to adjust dose.  Will arrange for outpatient follow-up at the cancer center once definitive plan for discharge has been made.   LOS: 20 days   Mikey Bussing, DNP,  AGPCNP-BC, AOCNP 12/27/20    ADDENDUM  .Patient was Personally and independently interviewed, examined and relevant elements of the history of present illness were reviewed in details and an assessment and plan was created. All elements of the patient's history of present illness , assessment and plan were discussed in details with Mikey Bussing, DNP, AGPCNP-BC, AOCNP. The above documentation reflects our combined findings assessment and plan.       -PDL 1 and 0%.  Foundation 1 did show elevated TMB which might suggest role for immunotherapy in general but in this patient cannot really use immunotherapy given his cardiac transplant status. -No FGFR mutation to suggest benefit from Erdafitinib. -We will continue Padcev at this time.  No overt prohibitive toxicities that preclude this.  Patient be receiving cycle 1 day 15 on 12/28/2020 -Continue goals of care discussion, evaluation by PT/OT and consideration for discharge needs. -Nephrology following to determine ability to continue hemodialysis as outpatient and based on goals of care.  Sullivan Lone MD MS

## 2020-12-27 NOTE — Progress Notes (Addendum)
PROGRESS NOTE    Glenn Gonzalez  PIR:[id number]  DOB: [birth year]  DOA: 12/11/2020 PCP: Lajean Manes, MD Outpatient Specialists:   Hospital course:  Glenn Dibello Duehringis a 60 y.o.year old malewith medical history significant for Hodgkin lymphoma, cardiac transplant on tacrolimus and prednisone, chronic combined systolic/diastolic CHF, GERD, high-grade T1 bladder cancer with recent hospitalization from 63/01-6/0 for AKI complicated by hydronephrosis in the setting of new retroperitoneal lymphadenopathy. Patient underwent stent placement by urology and was treated for postoperative multifocal pneumonia.  He presented from urology office with reports of increased back pain not well controlled on oral pain medication as well as generalized fatigue, poor appetite. He was brought in under observation due to concern for back pain in the setting of known retroperitoneal lymphadenopathy.  Hospital course complicated by acute hyponatremia in the setting of poor appetite requiring IV fluids and persistent leukocytosis. CT abdomen imaging consistent with retroperitoneal adenopathy and dilation of right renal pelvis and collecting systems concerning for urothelial neoplasm with extensive metastatic process. Found to have acute right lower extremity DVT on venous duplex started on heparin on 1/10.Has opted for dialysis while awaiting [facility] response.  1/17: Tunneled dialysis cath placed by IR, initiated hemodialysis, first 1/17, tolerated 1/18: second hemodialysis planned 1/21: Chemotherapy, second planned today 1/21, undergoing HD   Subjective: Seen and examined.  No complaints. Objective: Vitals:   12/26/20 1834 12/26/20 2110 12/27/20 0500 12/27/20 0700  BP: 91/64 96/69 100/64   Pulse: 97 89 99   Resp: 17 18 18    Temp: 98.3 F (36.8 C) 98.5 F (36.9 C) 98.2 F (36.8 C)   TempSrc:  Oral Oral   SpO2: 95% 96% 95%   Weight:    101.8 kg  Height:        Intake/Output Summary (Last  24 hours) at 12/27/2020 1012 Last data filed at 12/27/2020 0900 Gross per 24 hour  Intake 600 ml  Output 100 ml  Net 500 ml   Filed Weights   12/24/20 0725 12/26/20 0500 12/27/20 0700  Weight: 105.4 kg 105.7 kg 101.8 kg     Exam: General exam: Appears calm and comfortable  Respiratory system: Clear to auscultation. Respiratory effort normal. Cardiovascular system: S1 & S2 heard, RRR. No JVD, murmurs, rubs, gallops or clicks. No pedal edema. Gastrointestinal system: Abdomen is nondistended, soft and nontender. No organomegaly or masses felt. Normal bowel sounds heard. Central nervous system: Alert and oriented. No focal neurological deficits. Extremities: Symmetric 5 x 5 power. Skin: No rashes, lesions or ulcers.  Psychiatry: Judgement and insight appear normal. Mood & affect appropriate.   Assessment & Plan:   Unfortunate 60 year old man status post cardiac transplant now with metastatic high-grade bladder CA  Constipation Resolved.  He has had 3 bowel movements in last 5 hours.  Will discontinue senna as well as lactulose.  Anasarca and electrolyte abnormalities Per hemodialysis and nephrology service  History of cardiac transplant Continue tacrolimus, to keep goal levels between 5-8 Steroid taper has been completed and is presently on prednisone 5 mg daily. Patient is followed by the [facility] transplant team Dr. Kae Heller has been consulted and is providing consultation.  Acute urinary retention with gross hematuria: Patient had acute urinary retention with some hematuria day before yesterday.  Unsure whether hematuria was secondary to traumatic due to In-N-Out catheter.  Patient did not consent to the indwelling Foley catheter as was recommended by urology.  He tells me today that he does not have any problem with urination anymore.  Copied  from previous notes: Retroperitoneal adenopathy and perinephric nodularity, high-grade bladder CA, recently diagnosed -Status post IR guided  retroperitoneal biopsy, pathology positive for poorly differentiated carcinoma.  Recent hospitalization 09/38-1/8 for AKI complicated by hydronephrosis in the setting of new retroperitoneal LAD, underwent stent placement. - Urology and oncology Consulted but no recent notes. --Startedon palliative chemo 1/14, palliative medicine following  -  C1D8 Padcev chemo  On 1/21, next due 1/28   AKI on CKD stage 3a, requiring dialysis, new problem  -Baseline creatinine 1.2-1.4 from 08/2020, 1.8 from last hospitalization -Suspect lymphatic obstruction due to malignancy, low albumin, renal function worsening over the last few weeks.  Nephrology was consulted. -Tunneled HD line was placed on the 1/17 and he has been started on dialysis. Bladder was noted to be distended on 1/20 he was seen by urology who recommended placement of a Foley if he was unable to void.  He is getting intermittent HD.  Nephrology also gave a trial of high-dose Lasix of 120 mg IV twice daily yesterday with no good response less poor urine output so diuretics were discontinued today.  Per nephrology, he is likely going to require long-term IHD.  Acute right lower extremity DVT -Continue IV heparin, H&H stable -If no further procedures planned will discuss transition to apixaban as his hemoglobin has been stable and if he continues to require dialysis would be unable to transition him to Lovenox.  If he is able to get off dialysis ideally he could be transitioned to Lovenox with monitoring of Xa levels.  Oncology has also recommended Lovenox.  Elevated ALT- monitor as immunosuppressive medications cleared hepatically   Small retroperitoneal hemorrhage status post biopsy -H&H stable  Small pericardial effusion -Incidentally noted on echocardiogram, without tamponade physiology  Chronic combined systolic and diastolic heart failure, history of cardiac transplant -Patient is status post heart transplant, prior EF 10% - EF has  now corrected to 60 to 65% -Volume control with HD, initiated on 1/17 -Dr. Haroldine Laws was contacted on admission as he has been in contact with transplant team as below.  History of cardiac transplant -Continue tacrolimus, to keep goal levels between 5-8 -Continue dexamethasone, reduced dose to 4 mg daily by Dr. Irene Limbo on 1/18 and okay to wean down to his baseline prednisone 5 mg daily--per note from Dr. Jeffie Pollock, to taper as quickly as possible which per Dr. Grier Mitts note okay to do so--he was tapered and has been on prednisone 5 mg p.o. daily since 12/26/2020. -Cardiology was consulted, per Dr Haroldine Laws, EF is normal, recommended to continue the immunosuppressive regimen and follow the tacrolimus levels. Dr. Haroldine Laws has notified/updated the transplant team at [facility]   Leukocytosis-clinically without infection and has been stable.  Could very well be due to steroids and malignancy itself.  Improving.  He is afebrile.  Hypothyroidism -Continue Synthroid  Goal of care: Patient apparently was DNR up until 12/25/20 when you talk to the chaplain and switched his CODE STATUS to full code.  Code Status:  Full code DVT Prophylaxis: IV heparin Family Communication:  None at bedside.  Discussed in length with patient.  He is alert and oriented and competent. DVT prophylaxis: Heparin Code Status: DNR Family Communication: None Disposition Plan:   Patient is from: Home  Anticipated Discharge Location: TBD  Barriers to Discharge: Ongoing need for hemodialysis with anasarca and on palliative chemotherapy, needs SNF and likely hemodialysis assignment  Is patient medically stable for Discharge: No   Procedures:  HD cath 1.17 IR guided biopsy 1/06  Consultants:   Nephrology Oncology Palliative IR   Data Reviewed:  Basic Metabolic Panel: Recent Labs  Lab 12/22/20 0948 12/23/20 0243 12/24/20 0718 12/25/20 0500 12/26/20 0429  NA 131* 130* 131* 129* 132*  K 5.3* 5.4* 5.3* 4.7 5.3*  CL  96* 95* 94* 94* 96*  CO2 20* 21* 23 20* 19*  GLUCOSE 101* 121* 111* 100* 94  BUN 80* 91* 105* 74* 87*  CREATININE 3.46* 3.77* 4.14* 3.33* 3.83*  CALCIUM 8.8* 8.8* 8.8* 8.7* 8.9  PHOS 5.5* 5.3* 5.6* 4.1  --    Liver Function Tests: Recent Labs  Lab 12/21/20 0357 12/22/20 0948 12/22/20 1001 12/23/20 0243 12/24/20 0718 12/25/20 0500 12/26/20 0429  AST 37  --  46*  --   --   --  60*  ALT 13  --  12  --   --   --  19  ALKPHOS 49  --  47  --   --   --  58  BILITOT 0.6  --  0.9  --   --   --  0.9  PROT 5.7*  --  5.6*  --   --   --  5.7*  ALBUMIN 2.9*   < > 2.9* 2.9* 2.8* 2.9* 3.1*   < > = values in this interval not displayed.   No results for input(s): LIPASE, AMYLASE in the last 168 hours. No results for input(s): AMMONIA in the last 168 hours. CBC: Recent Labs  Lab 12/21/20 0357 12/22/20 0404 12/23/20 0243 12/24/20 0718 12/25/20 0500 12/26/20 0429 12/27/20 0438  WBC 25.4*   < > 20.8* 25.4* 22.9* 20.2* 15.4*  NEUTROABS 22.3*  --   --   --   --   --   --   HGB 13.2   < > 13.3 12.9* 12.0* 13.4 13.1  HCT 40.3   < > 39.7 38.7* 35.5* 42.2 41.1  MCV 95.5   < > 94.7 94.9 95.2 98.4 97.4  PLT 355   < > 363 355 292 251 272   < > = values in this interval not displayed.   Cardiac Enzymes: No results for input(s): CKTOTAL, CKMB, CKMBINDEX, TROPONINI in the last 168 hours. BNP (last 3 results) No results for input(s): PROBNP in the last 8760 hours. CBG: No results for input(s): GLUCAP in the last 168 hours.  No results found for this or any previous visit (from the past 240 hour(s)).    Studies: No results found.   Scheduled Meds: . allopurinol  200 mg Oral Daily  . artificial tears  1 application Both Eyes QHS  . azelastine  1 spray Each Nare Daily   And  . fluticasone  1 spray Each Nare Daily  . cetirizine  10 mg Oral QHS  . Chlorhexidine Gluconate Cloth  6 each Topical Q0600  . dronabinol  2.5 mg Oral BID AC  . fentaNYL  1 patch Transdermal Q72H  . lactulose  20  g Oral BID  . levothyroxine  125 mcg Oral Daily  . metoCLOPramide  5 mg Oral TID AC & HS  . midodrine  5 mg Oral TID WC  . nystatin  5 mL Oral QID  . polyvinyl alcohol  1 drop Both Eyes TID  . predniSONE  5 mg Oral Q breakfast  . senna-docusate  3 tablet Oral BID  . Tacrolimus ER  1.5 mg Oral Daily  . traZODone  100 mg Oral QHS   Continuous Infusions: . sodium chloride    .  heparin 1,350 Units/hr (12/27/20 0521)    Principal Problem:   Metastatic urothelial carcinoma ([place]) Active Problems:   SYSTOLIC HEART FAILURE, CHRONIC   Acute kidney injury superimposed on CKD (HCC)   Hypothyroidism   Hyponatremia   Back pain   Retroperitoneal lymphadenopathy   Liver lesion   Acute on chronic combined systolic and diastolic CHF (congestive heart failure) (HCC)   Acute DVT of right tibial vein (HCC)   Retroperitoneal hemorrhage   Neoplasm related pain   Encounter for antineoplastic chemotherapy   Abdominal distension     Darliss Cheney, [facility]  If 7PM-7AM, please contact night-coverage [email or web address] 12/27/2020, 10:12 AM    LOS: 20 days

## 2020-12-27 NOTE — Progress Notes (Signed)
[place] for heparin Indication: DVT  Labs: Recent Labs    12/25/20 0500 12/26/20 0429 12/26/20 1813 12/27/20 0438  HGB 12.0* 13.4  --  13.1  HCT 35.5* 42.2  --  41.1  PLT 292 251  --  272  HEPARINUNFRC 0.23* 0.24* 0.55 0.51  CREATININE 3.33* 3.83*  --   --     Assessment: 60 yo male on heparin drip for RLE DVT.  Heparin level therapeutic.  No further blood clots/blood in patient's urine per RN.  Goal of Therapy:  Heparin level 0.3-0.7 units/ml  Monitor platelets by anticoagulation protocol: yes   Plan:  Continue heparin infusion at 1350 units/hr F/U AM labs  Chemo scheduled for tomorrow 1/28- > cancer center and IV team aware.   Thank you Anette Guarneri, PharmD 12/27/2020, 8:41 AM

## 2020-12-27 NOTE — Progress Notes (Signed)
Patient ID: Glenn Gonzalez, male   DOB: 1961/07/20, 60 y.o.   MRN: [id number]   Reviewed medical records  This NP visited patient at the bedside as a follow up for palliative medicine needs and emotional support. Initial PMT consult on 12-09-20  Elhadji Pecore Duehringis a 60 y.o.year old malewith medical history significant for Hodgkin lymphoma/remote, cardiac transplant on tacrolimus and prednisone, chronic combined systolic/diastolic CHF, GERD, high-grade T1 bladder cancer with recent hospitalization from 11/64-3/5 for AKI complicated by hydronephrosis in the setting of new retroperitoneal lymphadenopathy. Patient underwent stent placement by urology and was treated for postoperative multifocal pneumonia.  He presented from urology office with reports of increased back pain not well controlled on oral pain medication as well as generalized fatigue, poor appetite. He was brought in under observation due to concern for back pain in the setting of known retroperitoneal lymphadenopathy.  Hospital course complicated by acute hyponatremia in the setting of poor appetite requiring IV fluids and persistent leukocytosis. CT abdomen imaging consistent with retroperitoneal adenopathy and dilation of right renal pelvis and collecting systems concerning for urothelial neoplasm with extensive metastatic process. Found to have acute right lower extremity DVT on venous duplex started on heparin on 1/10.Has opted for dialysis while awaiting [facility] response.  1/17: Tunneled dialysis cath placed by IR, initiated hemodialysis, first 1/17, tolerated 1/18: second hemodialysis planned 1/21: Chemotherapy, second planned today 1/21, undergoing HD  Complex serious medical situation with overall poor prognosis.   Patient  is weak and lethargic, o/c voiced at this time.    Patient faces treatment option decisions, advanced directive decisions and anticipatory care needs.  Met as scheduled with wife at bedside for  continued conversation regarding current medical situation and decisions they face.  Created space and opportunity for patient and his wife to explore his thoughts and feelings regarding his current medical situation. Although patient acknowledges the seriousness of his current medical situation, yesterday he made decision to shift from DNR/DNI to full code.  Full CODE STATUS is not recommended, Encouraged patient to consider DNR/DNI status understanding evidenced based poor outcomes in similar hospitalized patient, as the cause of arrest is likely associated with advanced chronic illness rather than an easily reversible acute cardio-pulmonary event.   He tells me he is open to all offered and available medical interventions to prolong life. His wife verbalizes that she will support his decisions.    Therapeutic listening and emotional support.  Education offered on the  importance of continued conversation with his family and the medical providers regarding overall plan of care and treatment options,  ensuring decisions are within the context of the patients values and GOCs.   Questions and concerns addressed   Emotional support offered  Total time spent on the unit was 35 minutes      PMT will continue to support holistically  Greater than 50% of the time was spent in counseling and coordination of care  Wadie Lessen NP  Palliative Medicine Team Team Phone # [phone] Pager [phone]

## 2020-12-28 DIAGNOSIS — L8990 Pressure ulcer of unspecified site, unspecified stage: Secondary | ICD-10-CM | POA: Insufficient documentation

## 2020-12-28 DIAGNOSIS — R140 Abdominal distension (gaseous): Secondary | ICD-10-CM | POA: Diagnosis not present

## 2020-12-28 DIAGNOSIS — C7910 Secondary malignant neoplasm of unspecified urinary organs: Secondary | ICD-10-CM | POA: Diagnosis not present

## 2020-12-28 DIAGNOSIS — I82441 Acute embolism and thrombosis of right tibial vein: Secondary | ICD-10-CM | POA: Diagnosis not present

## 2020-12-28 LAB — COMPREHENSIVE METABOLIC PANEL
ALT: 22 U/L (ref 0–44)
AST: 51 U/L — ABNORMAL HIGH (ref 15–41)
Albumin: 2.7 g/dL — ABNORMAL LOW (ref 3.5–5.0)
Alkaline Phosphatase: 53 U/L (ref 38–126)
Anion gap: 16 — ABNORMAL HIGH (ref 5–15)
BUN: 61 mg/dL — ABNORMAL HIGH (ref 6–20)
CO2: 20 mmol/L — ABNORMAL LOW (ref 22–32)
Calcium: 8.3 mg/dL — ABNORMAL LOW (ref 8.9–10.3)
Chloride: 97 mmol/L — ABNORMAL LOW (ref 98–111)
Creatinine, Ser: 4.17 mg/dL — ABNORMAL HIGH (ref 0.61–1.24)
GFR, Estimated: 16 mL/min — ABNORMAL LOW (ref >60)
Glucose, Bld: 95 mg/dL (ref 70–99)
Potassium: 4.2 mmol/L (ref 3.5–5.1)
Sodium: 133 mmol/L — ABNORMAL LOW (ref 135–145)
Total Bilirubin: 1.2 mg/dL (ref 0.3–1.2)
Total Protein: 5.1 g/dL — ABNORMAL LOW (ref 6.5–8.1)

## 2020-12-28 LAB — CBC WITH DIFFERENTIAL/PLATELET
Abs Immature Granulocytes: 0.1 10*3/uL — ABNORMAL HIGH (ref 0.00–0.07)
Basophils Absolute: 0.1 10*3/uL (ref 0.0–0.1)
Basophils Relative: 1 %
Eosinophils Absolute: 0 10*3/uL (ref 0.0–0.5)
Eosinophils Relative: 0 %
HCT: 33.6 % — ABNORMAL LOW (ref 39.0–52.0)
Hemoglobin: 11 g/dL — ABNORMAL LOW (ref 13.0–17.0)
Immature Granulocytes: 1 %
Lymphocytes Relative: 2 %
Lymphs Abs: 0.3 10*3/uL — ABNORMAL LOW (ref 0.7–4.0)
MCH: 31.6 pg (ref 26.0–34.0)
MCHC: 32.7 g/dL (ref 30.0–36.0)
MCV: 96.6 fL (ref 80.0–100.0)
Monocytes Absolute: 1.2 10*3/uL — ABNORMAL HIGH (ref 0.1–1.0)
Monocytes Relative: 11 %
Neutro Abs: 9.4 10*3/uL — ABNORMAL HIGH (ref 1.7–7.7)
Neutrophils Relative %: 85 %
Platelets: 239 10*3/uL (ref 150–400)
RBC: 3.48 MIL/uL — ABNORMAL LOW (ref 4.22–5.81)
RDW: 16.1 % — ABNORMAL HIGH (ref 11.5–15.5)
WBC: 11 10*3/uL — ABNORMAL HIGH (ref 4.0–10.5)
nRBC: 0.2 % (ref 0.0–0.2)

## 2020-12-28 LAB — HEPARIN LEVEL (UNFRACTIONATED): Heparin Unfractionated: 0.3 IU/mL (ref 0.30–0.70)

## 2020-12-28 MED ORDER — PALONOSETRON HCL INJECTION 0.25 MG/5ML
0.2500 mg | Freq: Once | INTRAVENOUS | Status: AC
  Administered 2020-12-28: 0.25 mg via INTRAVENOUS
  Filled 2020-12-28: qty 5

## 2020-12-28 MED ORDER — PALONOSETRON HCL INJECTION 0.25 MG/5ML
0.2500 mg | Freq: Once | INTRAVENOUS | Status: DC
  Filled 2020-12-28 (×2): qty 5

## 2020-12-28 MED ORDER — SODIUM CHLORIDE 0.9 % IV SOLN
120.0000 mg | Freq: Once | INTRAVENOUS | Status: AC
  Administered 2020-12-28: 120 mg via INTRAVENOUS
  Filled 2020-12-28 (×2): qty 12

## 2020-12-28 MED ORDER — SODIUM CHLORIDE 0.9 % IV SOLN
10.0000 mg | Freq: Once | INTRAVENOUS | Status: DC
  Filled 2020-12-28: qty 1

## 2020-12-28 MED ORDER — SODIUM CHLORIDE 0.9 % IV SOLN: Freq: Once | INTRAVENOUS | Status: AC

## 2020-12-28 MED ORDER — ALBUMIN HUMAN 25 % IV SOLN
25.0000 g | Freq: Four times a day (QID) | INTRAVENOUS | Status: AC
  Administered 2020-12-28 (×2): 25 g via INTRAVENOUS
  Filled 2020-12-28 (×2): qty 100

## 2020-12-28 MED ORDER — DEXAMETHASONE SODIUM PHOSPHATE 100 MG/10ML IJ SOLN
10.0000 mg | Freq: Once | INTRAMUSCULAR | Status: AC
  Administered 2020-12-28: 10 mg via INTRAVENOUS
  Filled 2020-12-28: qty 1

## 2020-12-28 MED ORDER — MIDODRINE HCL 5 MG PO TABS
10.0000 mg | ORAL_TABLET | Freq: Three times a day (TID) | ORAL | Status: AC
  Administered 2020-12-28 – 2020-12-30 (×5): 10 mg via ORAL
  Filled 2020-12-28 (×6): qty 2

## 2020-12-28 MED ORDER — HEPARIN SODIUM (PORCINE) 1000 UNIT/ML IJ SOLN
1600.0000 [IU] | Freq: Once | INTRAMUSCULAR | Status: AC
  Administered 2020-12-28: 1600 [IU] via INTRAVENOUS

## 2020-12-28 MED ORDER — ENFORTUMAB VEDOTIN-EJFV CHEMO 20 MG IV SOLR
120.0000 mg | Freq: Once | INTRAVENOUS | Status: DC
Start: 2020-12-28 — End: 2020-12-28

## 2020-12-28 NOTE — Progress Notes (Signed)
[place] for heparin Indication: DVT  Labs: Recent Labs    12/26/20 0429 12/26/20 1813 12/27/20 0438 12/28/20 0417  HGB 13.4  --  13.1 11.0*  HCT 42.2  --  41.1 33.6*  PLT 251  --  272 239  HEPARINUNFRC 0.24* 0.55 0.51 0.30  CREATININE 3.83*  --   --  4.17*    Assessment: 60 yo male on heparin drip for RLE DVT.  Heparin level therapeutic.  Hgb down 13.1>11.0, stable  pltc wnl stable.  Blood clots with in and out foley, now resolved.   No bleeding reported.   Goal of Therapy:  Heparin level 0.3-0.7 units/ml  Monitor platelets by anticoagulation protocol: yes   Plan:  Continue heparin infusion at 1350 units/hr Daily heparin level and CBC  Chemo scheduled for today 1/28- > cancer center and IV team RN aware.   Thank you Nicole Cella, RPh Clinical Pharmacist 12/28/2020, 9:13 AM

## 2020-12-28 NOTE — Progress Notes (Signed)
Okay to give PADCEV via [facility] per Dr. Jonnie Finner

## 2020-12-28 NOTE — Consult Note (Signed)
WOC Nurse Consult Note: Patient receiving care in [facility] 5M06.  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  The areas are red and very painful.  For this area I have ordered InterDry.  The apex of the gluteal cleft and the coccyx area have an evolving DTPI that measures 4 cm x 1.8 cm.  The wound base is pink and purple.  The overlying skin is sloughing off.  For this area the following approach:  Place a narrow piece of Aquacel into the intergluteal fold and on the coccyx area. Cover with a foam dressing.  Change every day and prn.  I have also added Prevalon heel lift boots as a preventive measure for heel injury.  And, a standard size bed with air mattress. Pressure Injury POA: No Measurement: Wound bed: Drainage (amount, consistency, odor)  Periwound: Dressing procedure/placement/frequency: Val Riles, RN, MSN, CWOCN, CNS-BC, pager [phone]

## 2020-12-28 NOTE — Progress Notes (Signed)
PROGRESS NOTE  Glenn Gonzalez UXN:[id number] DOB: 05/12/1961 DOA: 12/15/2020 PCP: Lajean Manes, MD   LOS: 21 days   Brief Narrative / Interim history: 60 year old male with history of Hodgkin's lymphoma in the past, cardiac transplant on tacrolimus and prednisone, chronic combined systolic and diastolic CHF, high-grade bladder cancer recently hospitalized 25/42-7/0 for AKI complicated by hydronephrosis in the setting of new peritoneal lymphadenopathy status post stent by urology, comes to the hospital with generalized fatigue, poor appetite, was found to be in acute kidney injury.  He was started on dialysis.  He was also found to have acute right lower extremity DVT and started on heparin.  Subjective / 24h Interval events: He complains of mild lightheadedness, denies any chest pain, denies any shortness of breath.  Assessment & Plan: Principal Problem Acute kidney injury on chronic kidney disease stage IIIa, currently requiring HD -Nephrology following, discussed with Dr. Johnney Ou this morning.  He is intermittently hypotensive and has been placed on midodrine -Status post tunneled HD cath placement on 1/17.  Did not respond to high-dose Lasix trial.  Per nephrology, he is likely going to require long-term HD but there is concern whether he actually is tolerating his HD during intermittent hypotension  Active Problems High-grade bladder cancer, retroperitoneal lymphadenopathy, recently diagnosed -He is status post IR guided retroperitoneal biopsy, pathology was positive for poorly differentiated carcinoma.  Oncology consulted, he is on palliative chemo, next dose due today  Acute right lower extremity DVT -Continue IV heparin, probably needs Coumadin soon as he is now dialysis dependent and cannot use Eliquis  History of cardiac transplant -Continue tacrolimus, prednisone -2D echo during this hospital stay showed normal EF, continue immunosuppressive regiment.  Dr. Haroldine Laws consulted  earlier on has notified and updated the transplant team at [facility]  Hypothyroidism  -continue Synthroid  Hypotension -Likely multifactorial in the setting of ESRD, give albumin today.  Continue midodrine.  Acute urinary retention with gross hematuria -Apparently resolved, he has refused indwelling Foley catheter as it was originally recommended by urology  Anasarca -Due to hypoalbuminemia, significant third spacing.  Give albumin today, continue dialysis as tolerated in the setting of hypotension  Goals of care -Has very poor prognosis, remains significantly fluid overloaded though appears to be intravascularly depleted with hypoalbuminemia, not sure if he will tolerate dialysis long-term, initially he was DNR but now converted back to full code and currently getting chemotherapy.  I hope that he will stabilize and improve however will continue to discuss with him as his prognosis is extremely guarded  Scheduled Meds: . allopurinol  200 mg Oral Daily  . artificial tears  1 application Both Eyes QHS  . azelastine  1 spray Each Nare Daily   And  . fluticasone  1 spray Each Nare Daily  . cetirizine  10 mg Oral QHS  . Chlorhexidine Gluconate Cloth  6 each Topical Q0600  . dronabinol  2.5 mg Oral BID AC  . enfortumab vedotin-ejfv (PADCEV) CHEMO IV infusion  120 mg Intravenous Once  . fentaNYL  1 patch Transdermal Q72H  . levothyroxine  125 mcg Oral Daily  . metoCLOPramide  5 mg Oral TID AC & HS  . midodrine  10 mg Oral TID WC  . nystatin  5 mL Oral QID  . polyvinyl alcohol  1 drop Both Eyes TID  . predniSONE  5 mg Oral Q breakfast  . Tacrolimus ER  1.5 mg Oral Daily  . traZODone  100 mg Oral QHS   Continuous Infusions: . sodium  chloride    . albumin human    . heparin 1,350 Units/hr (12/27/20 1647)   PRN Meds:.acetaminophen **OR** acetaminophen, alum & mag hydroxide-simeth, bisacodyl, calcium carbonate, HYDROmorphone (DILAUDID) injection, HYDROmorphone, magic mouthwash w/lidocaine,  magnesium hydroxide, midodrine, mineral oil, ondansetron **OR** ondansetron (ZOFRAN) IV, pantoprazole, prochlorperazine  Diet Orders (From admission, onward)    Start     Ordered   12/17/20 1339  Diet renal with fluid restriction Fluid restriction: 1200 mL Fluid; Room service appropriate? Yes; Fluid consistency: Thin  Diet effective now       Question Answer Comment  Fluid restriction: 1200 mL Fluid   Room service appropriate? Yes   Fluid consistency: Thin      12/17/20 1339          DVT prophylaxis: Place TED hose Start: 12/11/20 1137 SCDs Start: 12/23/2020 1712     Code Status: Full Code  Family Communication: No family at bedside  Status is: Inpatient  Remains inpatient appropriate because:Inpatient level of care appropriate due to severity of illness   Dispo: The patient is from: Home              Anticipated d/c is to: SNF              Anticipated d/c date is: > 3 days              Patient currently is not medically stable to d/c.   Difficult to place patient No  Level of care: Med-Surg  Consultants:  Cardiology  Nephrology  Oncology   Procedures:  2D echo HD  Microbiology  None   Antimicrobials: None     Objective: Vitals:   12/28/20 0444 12/28/20 0554 12/28/20 0821 12/28/20 0834  BP:  (!) 78/58 (!) 200/176 (!) 73/47  Pulse:  92 92 93  Resp:  18 18 16   Temp:  98.2 F (36.8 C) 98.7 F (37.1 C)   TempSrc:  Oral Oral   SpO2:  93% 97% 98%  Weight: 101.6 kg     Height:        Intake/Output Summary (Last 24 hours) at 12/28/2020 0948 Last data filed at 12/28/2020 0900 Gross per 24 hour  Intake 1200.62 ml  Output 2580 ml  Net -1379.38 ml   Filed Weights   12/27/20 1136 12/27/20 1446 12/28/20 0444  Weight: 104.2 kg 101.6 kg 101.6 kg    Examination:  Constitutional: NAD Eyes: no scleral icterus ENMT: Mucous membranes are moist.  Neck: normal, supple Respiratory: clear to auscultation bilaterally, no wheezing, no crackles. Normal  respiratory effort.  Diminished at the bases.  Cardiovascular: Regular rate and rhythm, no murmurs / rubs / gallops.  2+ pitting LE edema.  Anasarca Abdomen: non distended, no tenderness. Bowel sounds positive.  Musculoskeletal: no clubbing / cyanosis.  Skin: no rashes Neurologic: CN 2-12 grossly intact. Strength 5/5 in all 4.   Data Reviewed: I have independently reviewed following labs and imaging studies   CBC: Recent Labs  Lab 12/24/20 0718 12/25/20 0500 12/26/20 0429 12/27/20 0438 12/28/20 0417  WBC 25.4* 22.9* 20.2* 15.4* 11.0*  NEUTROABS  --   --   --   --  9.4*  HGB 12.9* 12.0* 13.4 13.1 11.0*  HCT 38.7* 35.5* 42.2 41.1 33.6*  MCV 94.9 95.2 98.4 97.4 96.6  PLT 355 292 251 272 390   Basic Metabolic Panel: Recent Labs  Lab 12/22/20 0948 12/23/20 0243 12/24/20 0718 12/25/20 0500 12/26/20 0429 12/28/20 0417  NA 131* 130* 131* 129* 132* 133*  K 5.3* 5.4* 5.3* 4.7 5.3* 4.2  CL 96* 95* 94* 94* 96* 97*  CO2 20* 21* 23 20* 19* 20*  GLUCOSE 101* 121* 111* 100* 94 95  BUN 80* 91* 105* 74* 87* 61*  CREATININE 3.46* 3.77* 4.14* 3.33* 3.83* 4.17*  CALCIUM 8.8* 8.8* 8.8* 8.7* 8.9 8.3*  PHOS 5.5* 5.3* 5.6* 4.1  --   --    Liver Function Tests: Recent Labs  Lab 12/22/20 1001 12/23/20 0243 12/24/20 0718 12/25/20 0500 12/26/20 0429 12/28/20 0417  AST 46*  --   --   --  60* 51*  ALT 12  --   --   --  19 22  ALKPHOS 47  --   --   --  58 53  BILITOT 0.9  --   --   --  0.9 1.2  PROT 5.6*  --   --   --  5.7* 5.1*  ALBUMIN 2.9* 2.9* 2.8* 2.9* 3.1* 2.7*   Coagulation Profile: No results for input(s): INR, PROTIME in the last 168 hours. HbA1C: No results for input(s): HGBA1C in the last 72 hours. CBG: No results for input(s): GLUCAP in the last 168 hours.  No results found for this or any previous visit (from the past 240 hour(s)).   Radiology Studies: No results found.  Marzetta Board, MD, PhD [facility]  Between 7 am - 7 pm I am available, please  contact me via Amion or Securechat  Between 7 pm - 7 am I am not available, please contact night coverage MD/APP via Amion

## 2020-12-28 NOTE — Progress Notes (Signed)
Glenn Gonzalez Progress Note     Assessment/ Plan:   1. Dialysis Dependent AKI on CKD 3a - b/l creat 1.2- 1.4 from oct 2021.   Renal function worsening over last few weeks. UA +wbc's and rbc's, no sig proteinuria. Imaging shows stable R ureteral stent and stable R collecting system dilatation. Has new significant leg edema. ECHO showed normal EF.   Creat 1.8 on admission > up to 2.3 then 2.8 after lasix tried, then 3.1 >  3.2 > 3.4 > up to 4.5 -- HD.  He likely progressed to ATN  IR placed [facility] 1/17 R IJ, HD #1 1/17  HD #3 1/21: 3hr, intra-dialytic hypotension therefore order midorine 10 mg and albumin 25 g. UF as tolerated.  HD #4 1/24 again limited by hypotension limiting UF despite midodrine and albumin support.  1/27 tolerated UF 2.6L  Next HD 1/29    D/cd diuretic given lack of response 2. Hyperkalemia -2K dialysate 3. Hyponatremia -  Inability to excrete free water 2/2 low GFR. Fluid restriction, improving with HD. 4. High-grade bladder cancer- recent dx , treated w/ BCG in Aug 2021, sp TURBT x 2 Aug and Sept. New RP adenopathy w/ R hydro in Dec 2021 > cysto and R ureteral stent per urology. 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 Onc consulting w/ [facility].  Has started chemotherapy (palliative) with Dr. Irene Limbo 5. H/o Hodgkin's lymphoma - remote 6. H/o cardiac transplant - taking prograf 1.5 mg /d and pred 5 /d per CV 7. Acute RLE DVT - per primary team. Positive for superficial venous thrombosis/thrombophlebitis of the mid right leg greater saphenous vein but neg for DVT. On heparin gtt. 8. Chronic diast CHF - had prior LVEF 10% but now has corrected to 60-65% 9. Hypotension:  BP 70s this AM; afebrile, WBC improved.  Suspect it's combination of low oncotic pressure with alb 2.7, pain medications.  Adrenal seem ok per imaging and cortisol.  Getting IV albumin bolus today.  Midodrine increased.   Dispo - I've asked he come to  dialysis in a chair tomorrow to make sure he can tolerate it.  He's certainly very deconditioned with an incurable metastatic malignancy.   Hypotension this morning is a new concern as well; we will do dialysis without albumin support as well to simulate outpt HD .  We will continue to proceed with goal of d/c to outpt to receive ongoing palliative chemo. He told me today there is a small chance of cure with his therapy - per notes I'm not sure this is correct.      Subjective:   Seen in room this AM - BPs 70s this AM.  Only symptom is mildly lightheaded.  Going to receive IV albumin.  Due for chemo today.    Objective:   BP 96/60   Pulse 94   Temp 98.7 F (37.1 C)   Resp 17   Ht 6\' 2"  (1.88 m)   Wt 101.6 kg   SpO2 98%   BMI 28.76 kg/m   Intake/Output Summary (Last 24 hours) at 12/28/2020 1029 Last data filed at 12/28/2020 0900 Gross per 24 hour  Intake 1200.62 ml  Output 2580 ml  Net -1379.38 ml   Weight change: 2.4 kg  Physical Exam: Genalert, NAD No jvd or bruits Chest clear bilatto bases RRR no MRG Abdprotuberant, no ascites by exam,  +BS Ext3+ pitting bilat LE from feet to hips to abd/chest wall Neuro is alert and conversant  TDC site clean.   Imaging: No results found.  Labs: BMET Recent Labs  Lab 12/22/20 0948 12/23/20 0243 12/24/20 0718 12/25/20 0500 12/26/20 0429 12/28/20 0417  NA 131* 130* 131* 129* 132* 133*  K 5.3* 5.4* 5.3* 4.7 5.3* 4.2  CL 96* 95* 94* 94* 96* 97*  CO2 20* 21* 23 20* 19* 20*  GLUCOSE 101* 121* 111* 100* 94 95  BUN 80* 91* 105* 74* 87* 61*  CREATININE 3.46* 3.77* 4.14* 3.33* 3.83* 4.17*  CALCIUM 8.8* 8.8* 8.8* 8.7* 8.9 8.3*  PHOS 5.5* 5.3* 5.6* 4.1  --   --    CBC Recent Labs  Lab 12/25/20 0500 12/26/20 0429 12/27/20 0438 12/28/20 0417  WBC 22.9* 20.2* 15.4* 11.0*  NEUTROABS  --   --   --  9.4*  HGB 12.0* 13.4 13.1 11.0*  HCT 35.5* 42.2 41.1 33.6*  MCV 95.2 98.4 97.4 96.6  PLT 292 251 272 239    Medications:     . allopurinol  200 mg Oral Daily  . artificial tears  1 application Both Eyes QHS  . azelastine  1 spray Each Nare Daily   And  . fluticasone  1 spray Each Nare Daily  . cetirizine  10 mg Oral QHS  . Chlorhexidine Gluconate Cloth  6 each Topical Q0600  . dronabinol  2.5 mg Oral BID AC  . enfortumab vedotin-ejfv (PADCEV) CHEMO IV infusion  120 mg Intravenous Once  . fentaNYL  1 patch Transdermal Q72H  . levothyroxine  125 mcg Oral Daily  . metoCLOPramide  5 mg Oral TID AC & HS  . midodrine  10 mg Oral TID WC  . nystatin  5 mL Oral QID  . polyvinyl alcohol  1 drop Both Eyes TID  . predniSONE  5 mg Oral Q breakfast  . Tacrolimus ER  1.5 mg Oral Daily  . traZODone  100 mg Oral QHS      Justin Mend, MD  12/28/2020, 10:29 AM

## 2020-12-28 NOTE — Progress Notes (Signed)
@   [address] Floor RN Percell Boston made aware patient Hemodialysis treatment will be done on tomorrow.

## 2020-12-28 NOTE — Progress Notes (Signed)
   12/28/20 0554  Assess: MEWS Score  Temp 98.2 F (36.8 C)  BP (!) 78/58  Pulse Rate 92  Resp 18  SpO2 93 %  O2 Device Room Air  Assess: MEWS Score  MEWS Temp 0  MEWS Systolic 2  MEWS Pulse 0  MEWS RR 0  MEWS LOC 0  MEWS Score 2  MEWS Score Color Yellow  Assess: if the MEWS score is Yellow or Red  Were vital signs taken at a resting state? Yes  Focused Assessment Change from prior assessment (see assessment flowsheet)  Early Detection of Sepsis Score *See Row Information* Low  MEWS guidelines implemented *See Row Information* Yes  Treat  MEWS Interventions Administered prn meds/treatments;Escalated (See documentation below)  Pain Scale 0-10  Pain Score 0  Take Vital Signs  Increase Vital Sign Frequency  Yellow: Q 2hr X 2 then Q 4hr X 2, if remains yellow, continue Q 4hrs  Escalate  MEWS: Escalate Yellow: discuss with charge nurse/RN and consider discussing with provider and RRT  Notify: Charge Nurse/RN  Name of Charge Nurse/RN Notified Emman, RN  Date Charge Nurse/RN Notified 12/28/20  Time Charge Nurse/RN Notified 0600  Notify: Provider  Provider Name/Title Jacqlyn Larsen, MD  Date Provider Notified 12/28/20  Time Provider Notified 0700  Notification Type Page  Notification Reason Change in status  Response See new orders  Date of Provider Response 12/28/20  Time of Provider Response 0701  Notify: Rapid Response  Name of Rapid Response RN Notified N/A  Document  Patient Outcome Not stable and remains on department  Progress note created (see row info) Yes

## 2020-12-28 NOTE — Progress Notes (Signed)
Physical Therapy Treatment Patient Details Name: Glenn Gonzalez MRN: [id number] DOB: 1961/09/29 Today's Date: 12/28/2020    History of Present Illness DAIVD FREDERICKSEN is a 60 y.o. male with medical history of hydronephrosis, hodgkin's lymphoma, colon cancer, bladder cancer, history of heart transplant, gout, depression, hypothyroidism. presents with c/o pain pain.    PT Comments    Pt received in bed, cooperative and pleasant, but anxious to move. Pt's wife was present in room for entire PT session. Pt with abdominal pain and stated that it hurt to eat. Pt was able to complete a supine to sit transfer, sit on EOB for ~30 seconds, and complete a sit to supine transfer all with modAx2. Pt's BP was low throughout session, ~80/50 in supine at beginning of session, 52/38 in sitting, and then 75/49 after he was brought back to supine. Pt demonstrated decline in cognition since last visit. Pt insisted on using a bar attached to his bed that would go over his head and refused to believe that it did not exist. Unable to be redirected. Pt was left in bed with all needs met, his wife present in the room, and nursing staff aware of his status.   Follow Up Recommendations  Other (comment);Supervision/Assistance - 24 hour (Hospice with home health if hospice allows)     Equipment Recommendations  Rolling walker with 5" wheels;3in1 (PT);Wheelchair (measurements PT);Wheelchair cushion (measurements PT);Other (comment);Hospital bed (hoyer lift with pad)    Recommendations for Other Services       Precautions / Restrictions Precautions Precautions: Fall Precaution Comments: high pain and anxiety Restrictions Weight Bearing Restrictions: No    Mobility  Bed Mobility Overal bed mobility: Needs Assistance Bed Mobility: Rolling;Supine to Sit;Sit to Supine Rolling: +2 for physical assistance;Mod assist   Supine to sit: Mod assist;+2 for physical assistance;HOB elevated Sit to supine: Mod  assist;+2 for physical assistance;HOB elevated   General bed mobility comments: very anxious with mobility, able to get to EOB with modAx2, able to tolerate <30seconds of sitting at EOB. in supine BP was ~80/50. after supine to sit transfer BP was 53/38 in sitting with arm movement. BP returned to 75/49 after sit to supine transfer and a few minutes of rest  Transfers                 General transfer comment: unable  Ambulation/Gait             General Gait Details: unable   Stairs             Wheelchair Mobility    Modified Rankin (Stroke Patients Only)       Balance Overall balance assessment: Needs assistance Sitting-balance support: Bilateral upper extremity supported;Feet supported Sitting balance-Leahy Scale: Fair Sitting balance - Comments: min guard for safety to maintain upright statically, pt unable to tolerate > 30 seconds of sitting                                    Cognition Arousal/Alertness: Awake/alert Behavior During Therapy: Anxious Overall Cognitive Status: Impaired/Different from baseline Area of Impairment: Attention;Awareness                   Current Attention Level: Sustained           General Comments: pleasant and cooperative, motivated but very anxious with mobility. kept referring to "a bar on the bed" that could go over his head and  be used to pull on. denied that the bar he was referring to was the overhead lift and insisted that it was attached to the bed somewhere      Exercises      General Comments        Pertinent Vitals/Pain Pain Assessment: Faces Faces Pain Scale: Hurts whole lot Pain Location: abdomen, back, generalized Pain Descriptors / Indicators: Aching;Grimacing;Guarding Pain Intervention(s): Monitored during session    Home Living                      Prior Function            PT Goals (current goals can now be found in the care plan section) Acute Rehab PT  Goals Patient Stated Goal: try to get stronger PT Goal Formulation: With patient Time For Goal Achievement: 01/09/21 Potential to Achieve Goals: Fair    Frequency    Min 2X/week      PT Plan Current plan remains appropriate    Co-evaluation     PT goals addressed during session: Mobility/safety with mobility;Balance;Strengthening/ROM        AM-PAC PT "6 Clicks" Mobility   Outcome Measure  Help needed turning from your back to your side while in a flat bed without using bedrails?: A Lot Help needed moving from lying on your back to sitting on the side of a flat bed without using bedrails?: A Lot Help needed moving to and from a bed to a chair (including a wheelchair)?: Total Help needed standing up from a chair using your arms (e.g., wheelchair or bedside chair)?: Total Help needed to walk in hospital room?: Total Help needed climbing 3-5 steps with a railing? : Total 6 Click Score: 8    End of Session   Activity Tolerance: Patient limited by fatigue;Patient limited by pain;Other (comment) (Hypotensive with BP dropping to 53/38 in sitting) Patient left: in bed;with call bell/phone within reach;with family/visitor present Nurse Communication: Mobility status;Other (comment) (BP status) PT Visit Diagnosis: Difficulty in walking, not elsewhere classified (R26.2);Muscle weakness (generalized) (M62.81);Unsteadiness on feet (R26.81);Other abnormalities of gait and mobility (R26.89)     Time:  -     Charges:                       Rosita Kea, SPT

## 2020-12-29 DIAGNOSIS — R140 Abdominal distension (gaseous): Secondary | ICD-10-CM | POA: Diagnosis not present

## 2020-12-29 DIAGNOSIS — C7910 Secondary malignant neoplasm of unspecified urinary organs: Secondary | ICD-10-CM | POA: Diagnosis not present

## 2020-12-29 DIAGNOSIS — I82441 Acute embolism and thrombosis of right tibial vein: Secondary | ICD-10-CM | POA: Diagnosis not present

## 2020-12-29 LAB — CBC
HCT: 28.6 % — ABNORMAL LOW (ref 39.0–52.0)
Hemoglobin: 9.8 g/dL — ABNORMAL LOW (ref 13.0–17.0)
MCH: 32.8 pg (ref 26.0–34.0)
MCHC: 34.3 g/dL (ref 30.0–36.0)
MCV: 95.7 fL (ref 80.0–100.0)
Platelets: 234 10*3/uL (ref 150–400)
RBC: 2.99 MIL/uL — ABNORMAL LOW (ref 4.22–5.81)
RDW: 16.3 % — ABNORMAL HIGH (ref 11.5–15.5)
WBC: 10.5 10*3/uL (ref 4.0–10.5)
nRBC: 0 % (ref 0.0–0.2)

## 2020-12-29 LAB — BASIC METABOLIC PANEL
Anion gap: 15 (ref 5–15)
BUN: 81 mg/dL — ABNORMAL HIGH (ref 6–20)
CO2: 20 mmol/L — ABNORMAL LOW (ref 22–32)
Calcium: 8.4 mg/dL — ABNORMAL LOW (ref 8.9–10.3)
Chloride: 97 mmol/L — ABNORMAL LOW (ref 98–111)
Creatinine, Ser: 5.17 mg/dL — ABNORMAL HIGH (ref 0.61–1.24)
GFR, Estimated: 12 mL/min — ABNORMAL LOW (ref >60)
Glucose, Bld: 118 mg/dL — ABNORMAL HIGH (ref 70–99)
Potassium: 4.6 mmol/L (ref 3.5–5.1)
Sodium: 132 mmol/L — ABNORMAL LOW (ref 135–145)

## 2020-12-29 LAB — HEPARIN LEVEL (UNFRACTIONATED): Heparin Unfractionated: 0.44 IU/mL (ref 0.30–0.70)

## 2020-12-29 MED ORDER — HEPARIN SODIUM (PORCINE) 1000 UNIT/ML IJ SOLN
INTRAMUSCULAR | Status: AC
  Administered 2020-12-29: 3200 [IU] via INTRAVENOUS
  Filled 2020-12-29: qty 4

## 2020-12-29 MED ORDER — HEPARIN SODIUM (PORCINE) 1000 UNIT/ML IJ SOLN: 1000.0000 [IU] | INTRAMUSCULAR | Status: DC

## 2020-12-29 MED ORDER — MIDODRINE HCL 5 MG PO TABS
ORAL_TABLET | ORAL | Status: AC
  Administered 2020-12-29: 10 mg via ORAL
  Filled 2020-12-29: qty 2

## 2020-12-29 NOTE — Progress Notes (Signed)
[facility] Progress Note     Assessment/ Plan:   1. Dialysis Dependent AKI on CKD 3a - b/l creat 1.2- 1.4 from oct 2021.   Renal function worsening over last few weeks. UA +wbc's and rbc's, no sig proteinuria. Imaging shows stable R ureteral stent and stable R collecting system dilatation. Has new significant leg edema. ECHO showed normal EF.   Creat 1.8 on admission > up to 2.3 then 2.8 after lasix tried, then 3.1 >  3.2 > 3.4 > up to 4.5 -- HD.  He likely progressed to ATN  IR placed [facility] 1/17 R IJ, HD #1 1/17  HD #3 1/21: 3hr, intra-dialytic hypotension therefore order midorine 10 mg and albumin 25 g. UF as tolerated.  HD #4 1/24 again limited by hypotension limiting UF despite midodrine and albumin support.  1/27 tolerated UF 2.6L  Next HD 1/29 ; come in chair  D/cd diuretic given lack of response 2. Hyperkalemia -2K dialysate 3. Hyponatremia -  Inability to excrete free water 2/2 low GFR. Fluid restriction, improving with HD. 4. High-grade bladder cancer- recent dx , treated w/ BCG in Aug 2021, sp TURBT x 2 Aug and Sept. New RP adenopathy w/ R hydro in Dec 2021 > cysto and R ureteral stent per urology. 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 Onc consulting w/ [facility].  Has started chemotherapy (palliative) with Dr. Irene Limbo 5. H/o Hodgkin's lymphoma - remote 6. H/o cardiac transplant - taking prograf 1.5 mg /d and pred 5 /d per CV 7. Acute RLE DVT - per primary team. Positive for superficial venous thrombosis/thrombophlebitis of the mid right leg greater saphenous vein but neg for DVT. On heparin gtt. 8. Chronic diast CHF - had prior LVEF 10% but now has corrected to 60-65% 9. Hypotension:  BP 70s this AM; afebrile, WBC improved.  Suspect it's combination of low oncotic pressure with alb 2.7, pain medications.  Adrenal seem ok per imaging and cortisol.  Getting IV albumin bolus today.  Midodrine increased.   Dispo - I've asked  he come to dialysis in a chair today to make sure he can tolerate it.  He's certainly very deconditioned with an incurable metastatic malignancy.   Hypotension has been an issue as well; we will do dialysis without albumin support as well to simulate outpt HD .  We will continue to proceed with goal of d/c to outpt to receive ongoing palliative chemo. He told me today there is a small chance of cure with his therapy - per notes I'm not sure this is correct.      Subjective:   BP improved this AM No new complaints HD today   Objective:   BP 124/71 (BP Location: Right Arm)   Pulse 94   Temp 98.1 F (36.7 C) (Oral)   Resp 18   Ht 6\' 2"  (1.88 m)   Wt 104.8 kg   SpO2 94%   BMI 29.66 kg/m   Intake/Output Summary (Last 24 hours) at 12/29/2020 1202 Last data filed at 12/29/2020 0900 Gross per 24 hour  Intake 1048.12 ml  Output 0 ml  Net 1048.12 ml   Weight change: 0.581 kg  Physical Exam: Genalert, NAD No jvd or bruits Chest clear bilatto bases RRR no MRG Abdprotuberant, no ascites by exam,  +BS Ext3+ pitting bilat LE from feet to hips to abd/chest wall Neuro is alert and conversant TDC site clean.   Imaging: No results found.  Labs: [employer]  Lab 12/23/20 0243 12/24/20 0718 12/25/20 0500 12/26/20 0429 12/28/20 0417 12/29/20 0516  NA 130* 131* 129* 132* 133* 132*  K 5.4* 5.3* 4.7 5.3* 4.2 4.6  CL 95* 94* 94* 96* 97* 97*  CO2 21* 23 20* 19* 20* 20*  GLUCOSE 121* 111* 100* 94 95 118*  BUN 91* 105* 74* 87* 61* 81*  CREATININE 3.77* 4.14* 3.33* 3.83* 4.17* 5.17*  CALCIUM 8.8* 8.8* 8.7* 8.9 8.3* 8.4*  PHOS 5.3* 5.6* 4.1  --   --   --    CBC Recent Labs  Lab 12/26/20 0429 12/27/20 0438 12/28/20 0417 12/29/20 0516  WBC 20.2* 15.4* 11.0* 10.5  NEUTROABS  --   --  9.4*  --   HGB 13.4 13.1 11.0* 9.8*  HCT 42.2 41.1 33.6* 28.6*  MCV 98.4 97.4 96.6 95.7  PLT 251 272 239 234    Medications:    . allopurinol  200 mg Oral Daily  . artificial tears  1  application Both Eyes QHS  . azelastine  1 spray Each Nare Daily   And  . fluticasone  1 spray Each Nare Daily  . cetirizine  10 mg Oral QHS  . Chlorhexidine Gluconate Cloth  6 each Topical Q0600  . dronabinol  2.5 mg Oral BID AC  . fentaNYL  1 patch Transdermal Q72H  . levothyroxine  125 mcg Oral Daily  . metoCLOPramide  5 mg Oral TID AC & HS  . midodrine  10 mg Oral TID WC  . nystatin  5 mL Oral QID  . polyvinyl alcohol  1 drop Both Eyes TID  . predniSONE  5 mg Oral Q breakfast  . Tacrolimus ER  1.5 mg Oral Daily  . traZODone  100 mg Oral QHS      Justin Mend, MD  12/29/2020, 12:02 PM

## 2020-12-29 NOTE — Progress Notes (Signed)
PROGRESS NOTE  Glenn Gonzalez XTA:[id number] DOB: 1961-08-29 DOA: 12/03/2020 PCP: Lajean Manes, MD   LOS: 22 days   Brief Narrative / Interim history: 60 year old male with history of Hodgkin's lymphoma in the past, cardiac transplant on tacrolimus and prednisone, chronic combined systolic and diastolic CHF, high-grade bladder cancer recently hospitalized 65/53-7/4 for AKI complicated by hydronephrosis in the setting of new peritoneal lymphadenopathy status post stent by urology, comes to the hospital with generalized fatigue, poor appetite, was found to be in acute kidney injury.  He was started on dialysis.  He was also found to have acute right lower extremity DVT and started on heparin.  Subjective / 24h Interval events: Feeling better today, no significant complaints  Assessment & Plan: Principal Problem Acute kidney injury on chronic kidney disease stage IIIa, currently requiring HD -Nephrology following.  He is intermittently hypotensive and has been placed on midodrine.  Blood pressure much better this morning -Status post tunneled HD cath placement on 1/17.  Did not respond to high-dose Lasix trial.  Per nephrology, he is likely going to require long-term HD but there is concern whether he actually is tolerating his HD during intermittent hypotension.  We will try to dialyze in the chair  Active Problems High-grade bladder cancer, retroperitoneal lymphadenopathy, recently diagnosed -He is status post IR guided retroperitoneal biopsy, pathology was positive for poorly differentiated carcinoma.  Oncology consulted, he is on palliative chemo, last dose 1/28  Acute right lower extremity DVT -Continue IV heparin, probably needs Coumadin soon as he is now dialysis dependent and cannot use Eliquis.  History of cardiac transplant -Continue tacrolimus, prednisone -2D echo during this hospital stay showed normal EF, continue immunosuppressive regiment.  Dr. Haroldine Laws consulted earlier  on has notified and updated the transplant team at [facility]  Hypothyroidism  -continue Synthroid  Hypotension -Likely multifactorial in the setting of ESRD, received albumin 1/28, continue midodrine.  Blood pressure much better this morning  Acute urinary retention with gross hematuria -Apparently resolved, he has refused indwelling Foley catheter as it was originally recommended by urology  Anasarca -Due to hypoalbuminemia, significant third spacing.  Remove fluid by dialysis  Goals of care -Has very poor prognosis, remains significantly fluid overloaded though appears to be intravascularly depleted with hypoalbuminemia, not sure if he will tolerate dialysis long-term, initially he was DNR but now converted back to full code and currently getting chemotherapy.  I hope that he will stabilize and improve however will continue to discuss with him as his prognosis is extremely guarded  Scheduled Meds: . allopurinol  200 mg Oral Daily  . artificial tears  1 application Both Eyes QHS  . azelastine  1 spray Each Nare Daily   And  . fluticasone  1 spray Each Nare Daily  . cetirizine  10 mg Oral QHS  . Chlorhexidine Gluconate Cloth  6 each Topical Q0600  . dronabinol  2.5 mg Oral BID AC  . fentaNYL  1 patch Transdermal Q72H  . levothyroxine  125 mcg Oral Daily  . metoCLOPramide  5 mg Oral TID AC & HS  . midodrine  10 mg Oral TID WC  . nystatin  5 mL Oral QID  . polyvinyl alcohol  1 drop Both Eyes TID  . predniSONE  5 mg Oral Q breakfast  . Tacrolimus ER  1.5 mg Oral Daily  . traZODone  100 mg Oral QHS   Continuous Infusions: . sodium chloride    . heparin 1,350 Units/hr (12/28/20 1840)   PRN Meds:.acetaminophen **  OR** acetaminophen, alum & mag hydroxide-simeth, bisacodyl, calcium carbonate, HYDROmorphone (DILAUDID) injection, HYDROmorphone, magic mouthwash w/lidocaine, magnesium hydroxide, midodrine, mineral oil, ondansetron **OR** ondansetron (ZOFRAN) IV, pantoprazole,  prochlorperazine  Diet Orders (From admission, onward)    Start     Ordered   12/17/20 1339  Diet renal with fluid restriction Fluid restriction: 1200 mL Fluid; Room service appropriate? Yes; Fluid consistency: Thin  Diet effective now       Question Answer Comment  Fluid restriction: 1200 mL Fluid   Room service appropriate? Yes   Fluid consistency: Thin      12/17/20 1339          DVT prophylaxis: Place TED hose Start: 12/11/20 1137 SCDs Start: 12/28/2020 1712     Code Status: Full Code  Family Communication: No family at bedside  Status is: Inpatient  Remains inpatient appropriate because:Inpatient level of care appropriate due to severity of illness   Dispo: The patient is from: Home              Anticipated d/c is to: SNF              Anticipated d/c date is: > 3 days              Patient currently is not medically stable to d/c.   Difficult to place patient No  Level of care: Med-Surg  Consultants:  Cardiology  Nephrology  Oncology   Procedures:  2D echo HD  Microbiology  None   Antimicrobials: None     Objective: Vitals:   12/28/20 1726 12/28/20 2137 12/29/20 0500 12/29/20 0510  BP: 110/73 104/64  122/72  Pulse: 90 82  91  Resp: 18     Temp: 98.4 F (36.9 C) 97.7 F (36.5 C)  98.2 F (36.8 C)  TempSrc: Oral Oral  Oral  SpO2: 94% 91%  91%  Weight:  104.8 kg 104.8 kg   Height:        Intake/Output Summary (Last 24 hours) at 12/29/2020 6712 Last data filed at 12/29/2020 0600 Gross per 24 hour  Intake 928.12 ml  Output 0 ml  Net 928.12 ml   Filed Weights   12/28/20 0444 12/28/20 2137 12/29/20 0500  Weight: 101.6 kg 104.8 kg 104.8 kg    Examination:  Constitutional: No distress, in bed Eyes: No icterus ENMT: mmm  Neck: normal, supple Respiratory: Diminished breath sounds at the bases but overall clear without wheezing or crackles Cardiovascular: Regular rate and rhythm, no murmurs, 2+ pitting edema, anasarca Abdomen: Soft,  nontender, nondistended, bowel sounds positive Musculoskeletal: no clubbing / cyanosis.  Skin: No rashes seen Neurologic: Nonfocal, equal strength  Data Reviewed: I have independently reviewed following labs and imaging studies   CBC: Recent Labs  Lab 12/25/20 0500 12/26/20 0429 12/27/20 0438 12/28/20 0417 12/29/20 0516  WBC 22.9* 20.2* 15.4* 11.0* 10.5  NEUTROABS  --   --   --  9.4*  --   HGB 12.0* 13.4 13.1 11.0* 9.8*  HCT 35.5* 42.2 41.1 33.6* 28.6*  MCV 95.2 98.4 97.4 96.6 95.7  PLT 292 251 272 239 458   Basic Metabolic Panel: Recent Labs  Lab 12/22/20 0948 12/23/20 0243 12/24/20 0718 12/25/20 0500 12/26/20 0429 12/28/20 0417 12/29/20 0516  NA 131* 130* 131* 129* 132* 133* 132*  K 5.3* 5.4* 5.3* 4.7 5.3* 4.2 4.6  CL 96* 95* 94* 94* 96* 97* 97*  CO2 20* 21* 23 20* 19* 20* 20*  GLUCOSE 101* 121* 111* 100* 94 95 118*  BUN 80* 91* 105* 74* 87* 61* 81*  CREATININE 3.46* 3.77* 4.14* 3.33* 3.83* 4.17* 5.17*  CALCIUM 8.8* 8.8* 8.8* 8.7* 8.9 8.3* 8.4*  PHOS 5.5* 5.3* 5.6* 4.1  --   --   --    Liver Function Tests: Recent Labs  Lab 12/22/20 1001 12/23/20 0243 12/24/20 0718 12/25/20 0500 12/26/20 0429 12/28/20 0417  AST 46*  --   --   --  60* 51*  ALT 12  --   --   --  19 22  ALKPHOS 47  --   --   --  58 53  BILITOT 0.9  --   --   --  0.9 1.2  PROT 5.6*  --   --   --  5.7* 5.1*  ALBUMIN 2.9* 2.9* 2.8* 2.9* 3.1* 2.7*   Coagulation Profile: No results for input(s): INR, PROTIME in the last 168 hours. HbA1C: No results for input(s): HGBA1C in the last 72 hours. CBG: No results for input(s): GLUCAP in the last 168 hours.  No results found for this or any previous visit (from the past 240 hour(s)).   Radiology Studies: No results found.  Marzetta Board, MD, PhD [facility]  Between 7 am - 7 pm I am available, please contact me via Amion or Securechat  Between 7 pm - 7 am I am not available, please contact night coverage MD/APP via Amion

## 2020-12-29 NOTE — Progress Notes (Signed)
[place] for heparin Indication: DVT  Labs: Recent Labs    12/27/20 0438 12/28/20 0417 12/29/20 0516  HGB 13.1 11.0* 9.8*  HCT 41.1 33.6* 28.6*  PLT 272 239 234  HEPARINUNFRC 0.51 0.30 0.44  CREATININE  --  4.17* 5.17*    Assessment: 60 yo male on heparin drip for RLE DVT.  Heparin level therapeutic.  Hgb now trending down-9.8,  pltc wnl stable.   Blood clots with in and out foley, now resolved.   No bleeding reported.   Goal of Therapy:  Heparin level 0.3-0.7 units/ml  Monitor platelets by anticoagulation protocol: yes   Plan:  Continue heparin infusion at 1350 units/hr Daily heparin level and CBC  Thank you Anette Guarneri, PharmD 12/29/2020, 7:42 AM

## 2020-12-30 DIAGNOSIS — R140 Abdominal distension (gaseous): Secondary | ICD-10-CM | POA: Diagnosis not present

## 2020-12-30 DIAGNOSIS — C7910 Secondary malignant neoplasm of unspecified urinary organs: Secondary | ICD-10-CM | POA: Diagnosis not present

## 2020-12-30 DIAGNOSIS — I82441 Acute embolism and thrombosis of right tibial vein: Secondary | ICD-10-CM | POA: Diagnosis not present

## 2020-12-30 LAB — CBC
HCT: 29.9 % — ABNORMAL LOW (ref 39.0–52.0)
Hemoglobin: 9.6 g/dL — ABNORMAL LOW (ref 13.0–17.0)
MCH: 31 pg (ref 26.0–34.0)
MCHC: 32.1 g/dL (ref 30.0–36.0)
MCV: 96.5 fL (ref 80.0–100.0)
Platelets: 231 10*3/uL (ref 150–400)
RBC: 3.1 MIL/uL — ABNORMAL LOW (ref 4.22–5.81)
RDW: 16.3 % — ABNORMAL HIGH (ref 11.5–15.5)
WBC: 11.9 10*3/uL — ABNORMAL HIGH (ref 4.0–10.5)
nRBC: 0.2 % (ref 0.0–0.2)

## 2020-12-30 LAB — COMPREHENSIVE METABOLIC PANEL
ALT: 34 U/L (ref 0–44)
AST: 76 U/L — ABNORMAL HIGH (ref 15–41)
Albumin: 2.7 g/dL — ABNORMAL LOW (ref 3.5–5.0)
Alkaline Phosphatase: 183 U/L — ABNORMAL HIGH (ref 38–126)
Anion gap: 13 (ref 5–15)
BUN: 60 mg/dL — ABNORMAL HIGH (ref 6–20)
CO2: 22 mmol/L (ref 22–32)
Calcium: 8.2 mg/dL — ABNORMAL LOW (ref 8.9–10.3)
Chloride: 100 mmol/L (ref 98–111)
Creatinine, Ser: 4.11 mg/dL — ABNORMAL HIGH (ref 0.61–1.24)
GFR, Estimated: 16 mL/min — ABNORMAL LOW (ref >60)
Glucose, Bld: 118 mg/dL — ABNORMAL HIGH (ref 70–99)
Potassium: 3.7 mmol/L (ref 3.5–5.1)
Sodium: 135 mmol/L (ref 135–145)
Total Bilirubin: 1.5 mg/dL — ABNORMAL HIGH (ref 0.3–1.2)
Total Protein: 4.8 g/dL — ABNORMAL LOW (ref 6.5–8.1)

## 2020-12-30 LAB — HEPARIN LEVEL (UNFRACTIONATED): Heparin Unfractionated: 0.63 IU/mL (ref 0.30–0.70)

## 2020-12-30 MED ORDER — SODIUM CHLORIDE 0.9 % IV BOLUS
250.0000 mL | Freq: Once | INTRAVENOUS | Status: AC | PRN
  Administered 2020-12-30: 250 mL via INTRAVENOUS

## 2020-12-30 MED ORDER — WARFARIN - PHARMACIST DOSING INPATIENT: Freq: Every day | Status: DC

## 2020-12-30 MED ORDER — WARFARIN SODIUM 5 MG PO TABS
5.0000 mg | ORAL_TABLET | Freq: Every day | ORAL | Status: DC
  Administered 2020-12-30 – 2020-12-31 (×2): 5 mg via ORAL
  Filled 2020-12-30 (×2): qty 1

## 2020-12-30 NOTE — Progress Notes (Signed)
[facility] Progress Note     Assessment/ Plan:   1. Dialysis Dependent AKI on CKD 3a - b/l creat 1.2- 1.4 from oct 2021.   Renal function worsening over last few weeks. UA +wbc's and rbc's, no sig proteinuria. Imaging shows stable R ureteral stent and stable R collecting system dilatation. Has new significant leg edema. ECHO showed normal EF.   Creat 1.8 on admission > up to 2.3 then 2.8 after lasix tried, then 3.1 >  3.2 > 3.4 > up to 4.5 -- HD.  He likely progressed to ATN  IR placed [facility] 1/17 R IJ, HD #1 1/17  HD #3 1/21: 3hr, intra-dialytic hypotension therefore order midorine 10 mg and albumin 25 g. UF as tolerated.  HD #4 1/24 again limited by hypotension limiting UF despite midodrine and albumin support.  1/27 tolerated UF 2.6L; 1/29 tolerated UF 1.5L in chair, midodrine support, no albumin since not available outpt  Next HD 2/1 ; come in chair  D/cd diuretic given lack of response 2. High-grade bladder cancer- recent dx , treated w/ BCG in Aug 2021, sp TURBT x 2 Aug and Sept. New RP adenopathy w/ R hydro in Dec 2021 > cysto and R ureteral stent per urology. 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 Onc consulting w/ [facility].  Has started chemotherapy (palliative) with Dr. Irene Limbo 3. H/o Hodgkin's lymphoma - remote 4. H/o cardiac transplant - taking prograf 1.5 mg /d and pred 5 /d per CV 5. Acute RLE DVT - per primary team. Positive for superficial venous thrombosis/thrombophlebitis of the mid right leg greater saphenous vein but neg for DVT. On heparin gtt and going to coumadin now 6. Chronic diast CHF - had prior LVEF 10% but now has corrected to 60-65% 7. Hypotension:  Modest hypotension which is being managed with midodrine.  Adrenal function ok.   8. Anemia:  Hb trending down past few days.  No obvious bleeding.  No ESA with malignancy.  Transfused > 7.  Hemoccult.   Dispo - He's certainly very deconditioned with an  incurable metastatic malignancy receiving palliative chemotherapy.  He wishes to proceed forward with full therapies.  CLIP to [place] was paused last week with hypotension, concern he may not tolerated outpt HD but we can reopen that this week.  Palliative care is following.  Last week he changed from DNR to full code.     Subjective:   BP 80s this AM No new complaints HD yesterday 1.5L UF - was able to tolerate sitting in a chair for the treatment.   Objective:   BP (!) 84/54 (BP Location: Right Arm)   Pulse 94   Temp 97.6 F (36.4 C) (Oral)   Resp 20   Ht 6\' 2"  (1.88 m)   Wt 104.8 kg   SpO2 93%   BMI 29.66 kg/m   Intake/Output Summary (Last 24 hours) at 12/30/2020 1043 Last data filed at 12/29/2020 1554 Gross per 24 hour  Intake -  Output 1446 ml  Net -1446 ml   Weight change:   Physical Exam: Genalert, NAD No jvd or bruits Chest clear bilatto bases RRR no MRG Abdprotuberant, no ascites by exam,  +BS Ext3+ pitting bilat LE from feet to hips to abd/chest wall Neuro is alert and conversant TDC site clean.   Imaging: No results found.  Labs: BMET Recent Labs  Lab 12/24/20 0718 12/25/20 0500 12/26/20 0429 12/28/20 0417 12/29/20 0516 12/30/20 0436  NA 131* 129* 132*  133* 132* 135  K 5.3* 4.7 5.3* 4.2 4.6 3.7  CL 94* 94* 96* 97* 97* 100  CO2 23 20* 19* 20* 20* 22  GLUCOSE 111* 100* 94 95 118* 118*  BUN 105* 74* 87* 61* 81* 60*  CREATININE 4.14* 3.33* 3.83* 4.17* 5.17* 4.11*  CALCIUM 8.8* 8.7* 8.9 8.3* 8.4* 8.2*  PHOS 5.6* 4.1  --   --   --   --    CBC Recent Labs  Lab 12/27/20 0438 12/28/20 0417 12/29/20 0516 12/30/20 0436  WBC 15.4* 11.0* 10.5 11.9*  NEUTROABS  --  9.4*  --   --   HGB 13.1 11.0* 9.8* 9.6*  HCT 41.1 33.6* 28.6* 29.9*  MCV 97.4 96.6 95.7 96.5  PLT 272 239 234 231    Medications:    . allopurinol  200 mg Oral Daily  . artificial tears  1 application Both Eyes QHS  . azelastine  1 spray Each Nare Daily   And  . fluticasone  1  spray Each Nare Daily  . cetirizine  10 mg Oral QHS  . Chlorhexidine Gluconate Cloth  6 each Topical Q0600  . dronabinol  2.5 mg Oral BID AC  . fentaNYL  1 patch Transdermal Q72H  . heparin sodium (porcine)  1,000 Units Intravenous Q T,Th,Sa-HD  . levothyroxine  125 mcg Oral Daily  . metoCLOPramide  5 mg Oral TID AC & HS  . midodrine  10 mg Oral TID WC  . nystatin  5 mL Oral QID  . polyvinyl alcohol  1 drop Both Eyes TID  . predniSONE  5 mg Oral Q breakfast  . Tacrolimus ER  1.5 mg Oral Daily  . traZODone  100 mg Oral QHS  . warfarin  5 mg Oral q1600  . Warfarin - Pharmacist Dosing Inpatient   Does not apply q1600      Justin Mend, MD  12/30/2020, 10:43 AM

## 2020-12-30 NOTE — Progress Notes (Signed)
[place] for heparin Indication: DVT  Labs: Recent Labs    12/28/20 0417 12/29/20 0516 12/30/20 0436  HGB 11.0* 9.8* 9.6*  HCT 33.6* 28.6* 29.9*  PLT 239 234 231  HEPARINUNFRC 0.30 0.44 0.63  CREATININE 4.17* 5.17* 4.11*    Assessment: 60 yo male on heparin drip for RLE DVT.  Heparin level therapeutic.  Hgb now trending down-9.6,  pltc wnl stable.   Blood clots with in and out foley, now resolved.   No new bleeding reported.   Goal of Therapy:  Heparin level 0.3-0.7 units/ml  Monitor platelets by anticoagulation protocol: yes   Plan:  Continue heparin infusion at 1350 units/hr Daily heparin level and CBC  Thank you Anette Guarneri, PharmD 12/30/2020, 7:50 AM

## 2020-12-30 NOTE — Progress Notes (Signed)
PROGRESS NOTE  Glenn Gonzalez EHM:[id number] DOB: 06-28-1961 DOA: 12/07/2020 PCP: Lajean Manes, MD   LOS: 23 days   Brief Narrative / Interim history: 60 year old male with history of Hodgkin's lymphoma in the past, cardiac transplant on tacrolimus and prednisone, chronic combined systolic and diastolic CHF, high-grade bladder cancer recently hospitalized 36/62-9/4 for AKI complicated by hydronephrosis in the setting of new peritoneal lymphadenopathy status post stent by urology, comes to the hospital with generalized fatigue, poor appetite, was found to be in acute kidney injury.  He was started on dialysis.  He was also found to have acute right lower extremity DVT and started on heparin.  Subjective / 24h Interval events: Feeling better, no chest pain, no abdominal pain, no nausea or vomiting.  Mouth sores are getting better  Assessment & Plan: Principal Problem Acute kidney injury on chronic kidney disease stage IIIa, currently requiring HD -Nephrology following.  He is intermittently hypotensive and has been placed on midodrine.  Blood pressure improved -Status post tunneled HD cath placement on 1/17.  Did not respond to high-dose Lasix trial.  Per nephrology, he is likely going to require long-term HD but there is concern whether he actually is tolerating his HD during intermittent hypotension.  -Was able to receive hemodialysis in the chair yesterday, patient hopeful that he will make a full recovery  Active Problems High-grade bladder cancer, retroperitoneal lymphadenopathy, recently diagnosed -He is status post IR guided retroperitoneal biopsy, pathology was positive for poorly differentiated carcinoma.  Oncology consulted, he is on palliative chemo, last dose 1/28  Acute right lower extremity DVT -Continue IV heparin, will touch base with nephrology, if no further procedures can probably start Coumadin  History of cardiac transplant -Continue tacrolimus, prednisone -2D echo  during this hospital stay showed normal EF, continue immunosuppressive regiment.  Dr. Haroldine Laws consulted earlier on has notified and updated the transplant team at [facility]  Hypothyroidism  -continue Synthroid  Hypotension -Likely multifactorial in the setting of ESRD, received albumin 1/28, continue midodrine.  Blood pressure better  Acute urinary retention with gross hematuria -Apparently resolved, he has refused indwelling Foley catheter as it was originally recommended by urology  Anasarca -Due to hypoalbuminemia, significant third spacing.  Remove fluid by dialysis  Goals of care -Has very poor prognosis, remains significantly fluid overloaded though appears to be intravascularly depleted with hypoalbuminemia, not sure if he will tolerate dialysis long-term, initially he was DNR but now converted back to full code and currently getting chemotherapy.  I hope that he will stabilize and improve however will continue to discuss with him as his prognosis is extremely guarded  Scheduled Meds: . allopurinol  200 mg Oral Daily  . artificial tears  1 application Both Eyes QHS  . azelastine  1 spray Each Nare Daily   And  . fluticasone  1 spray Each Nare Daily  . cetirizine  10 mg Oral QHS  . Chlorhexidine Gluconate Cloth  6 each Topical Q0600  . dronabinol  2.5 mg Oral BID AC  . fentaNYL  1 patch Transdermal Q72H  . heparin sodium (porcine)  1,000 Units Intravenous Q T,Th,Sa-HD  . levothyroxine  125 mcg Oral Daily  . metoCLOPramide  5 mg Oral TID AC & HS  . midodrine  10 mg Oral TID WC  . nystatin  5 mL Oral QID  . polyvinyl alcohol  1 drop Both Eyes TID  . predniSONE  5 mg Oral Q breakfast  . Tacrolimus ER  1.5 mg Oral Daily  .  traZODone  100 mg Oral QHS   Continuous Infusions: . sodium chloride    . heparin 1,350 Units/hr (12/30/20 0629)   PRN Meds:.acetaminophen **OR** acetaminophen, alum & mag hydroxide-simeth, bisacodyl, calcium carbonate, HYDROmorphone (DILAUDID) injection,  HYDROmorphone, magic mouthwash w/lidocaine, magnesium hydroxide, midodrine, mineral oil, ondansetron **OR** ondansetron (ZOFRAN) IV, pantoprazole, prochlorperazine  Diet Orders (From admission, onward)    Start     Ordered   12/17/20 1339  Diet renal with fluid restriction Fluid restriction: 1200 mL Fluid; Room service appropriate? Yes; Fluid consistency: Thin  Diet effective now       Question Answer Comment  Fluid restriction: 1200 mL Fluid   Room service appropriate? Yes   Fluid consistency: Thin      12/17/20 1339          DVT prophylaxis: Place TED hose Start: 12/11/20 1137 SCDs Start: 12/14/2020 1712     Code Status: Full Code  Family Communication: No family at bedside  Status is: Inpatient  Remains inpatient appropriate because:Inpatient level of care appropriate due to severity of illness   Dispo: The patient is from: Home              Anticipated d/c is to: SNF              Anticipated d/c date is: > 3 days              Patient currently is not medically stable to d/c.   Difficult to place patient No  Level of care: Med-Surg  Consultants:  Cardiology  Nephrology  Oncology   Procedures:  2D echo HD  Microbiology  None   Antimicrobials: None     Objective: Vitals:   12/29/20 1554 12/29/20 1858 12/29/20 2138 12/30/20 0446  BP: (!) 94/52 91/61 (!) 127/102 (!) 84/54  Pulse: 98 90 90 94  Resp: 17  (!) 22 20  Temp: 97.7 F (36.5 C) 98.5 F (36.9 C) 98.1 F (36.7 C) 97.6 F (36.4 C)  TempSrc: Oral Oral  Oral  SpO2: 94% 92% 93% 93%  Weight:      Height:        Intake/Output Summary (Last 24 hours) at 12/30/2020 0800 Last data filed at 12/29/2020 1554 Gross per 24 hour  Intake 120 ml  Output 1446 ml  Net -1326 ml   Filed Weights   12/28/20 0444 12/28/20 2137 12/29/20 0500  Weight: 101.6 kg 104.8 kg 104.8 kg    Examination:  Constitutional: NAD, in bed Eyes: No icterus ENMT: mmm Neck: normal, supple Respiratory: Diminished at the  bases, but overall clear, no wheezing, no crackles Cardiovascular: Regular rate and rhythm, no murmurs, 2+ pitting edema, anasarca Abdomen: Soft, NT, ND, bowel sounds positive Musculoskeletal: no clubbing / cyanosis.  Skin: No rashes seen Neurologic: No focal deficits  Data Reviewed: I have independently reviewed following labs and imaging studies   CBC: Recent Labs  Lab 12/26/20 0429 12/27/20 0438 12/28/20 0417 12/29/20 0516 12/30/20 0436  WBC 20.2* 15.4* 11.0* 10.5 11.9*  NEUTROABS  --   --  9.4*  --   --   HGB 13.4 13.1 11.0* 9.8* 9.6*  HCT 42.2 41.1 33.6* 28.6* 29.9*  MCV 98.4 97.4 96.6 95.7 96.5  PLT 251 272 239 234 756   Basic Metabolic Panel: Recent Labs  Lab 12/24/20 0718 12/25/20 0500 12/26/20 0429 12/28/20 0417 12/29/20 0516 12/30/20 0436  NA 131* 129* 132* 133* 132* 135  K 5.3* 4.7 5.3* 4.2 4.6 3.7  CL 94* 94*  96* 97* 97* 100  CO2 23 20* 19* 20* 20* 22  GLUCOSE 111* 100* 94 95 118* 118*  BUN 105* 74* 87* 61* 81* 60*  CREATININE 4.14* 3.33* 3.83* 4.17* 5.17* 4.11*  CALCIUM 8.8* 8.7* 8.9 8.3* 8.4* 8.2*  PHOS 5.6* 4.1  --   --   --   --    Liver Function Tests: Recent Labs  Lab 12/24/20 0718 12/25/20 0500 12/26/20 0429 12/28/20 0417 12/30/20 0436  AST  --   --  60* 51* 76*  ALT  --   --  19 22 34  ALKPHOS  --   --  58 53 183*  BILITOT  --   --  0.9 1.2 1.5*  PROT  --   --  5.7* 5.1* 4.8*  ALBUMIN 2.8* 2.9* 3.1* 2.7* 2.7*   Coagulation Profile: No results for input(s): INR, PROTIME in the last 168 hours. HbA1C: No results for input(s): HGBA1C in the last 72 hours. CBG: No results for input(s): GLUCAP in the last 168 hours.  No results found for this or any previous visit (from the past 240 hour(s)).   Radiology Studies: No results found.  Marzetta Board, MD, PhD [facility]  Between 7 am - 7 pm I am available, please contact me via Amion or Securechat  Between 7 pm - 7 am I am not available, please contact night coverage MD/APP  via Amion

## 2020-12-31 DIAGNOSIS — C7910 Secondary malignant neoplasm of unspecified urinary organs: Secondary | ICD-10-CM | POA: Diagnosis not present

## 2020-12-31 DIAGNOSIS — G893 Neoplasm related pain (acute) (chronic): Secondary | ICD-10-CM | POA: Diagnosis not present

## 2020-12-31 DIAGNOSIS — K769 Liver disease, unspecified: Secondary | ICD-10-CM | POA: Diagnosis not present

## 2020-12-31 DIAGNOSIS — N179 Acute kidney failure, unspecified: Secondary | ICD-10-CM | POA: Diagnosis not present

## 2020-12-31 LAB — CBC WITH DIFFERENTIAL/PLATELET
Abs Immature Granulocytes: 0.29 10*3/uL — ABNORMAL HIGH (ref 0.00–0.07)
Basophils Absolute: 0.1 10*3/uL (ref 0.0–0.1)
Basophils Relative: 1 %
Eosinophils Absolute: 0 10*3/uL (ref 0.0–0.5)
Eosinophils Relative: 0 %
HCT: 27.2 % — ABNORMAL LOW (ref 39.0–52.0)
Hemoglobin: 8.9 g/dL — ABNORMAL LOW (ref 13.0–17.0)
Immature Granulocytes: 3 %
Lymphocytes Relative: 3 %
Lymphs Abs: 0.3 10*3/uL — ABNORMAL LOW (ref 0.7–4.0)
MCH: 31.4 pg (ref 26.0–34.0)
MCHC: 32.7 g/dL (ref 30.0–36.0)
MCV: 96.1 fL (ref 80.0–100.0)
Monocytes Absolute: 1.1 10*3/uL — ABNORMAL HIGH (ref 0.1–1.0)
Monocytes Relative: 10 %
Neutro Abs: 9 10*3/uL — ABNORMAL HIGH (ref 1.7–7.7)
Neutrophils Relative %: 83 %
Platelets: 207 10*3/uL (ref 150–400)
RBC: 2.83 MIL/uL — ABNORMAL LOW (ref 4.22–5.81)
RDW: 16.3 % — ABNORMAL HIGH (ref 11.5–15.5)
WBC: 10.7 10*3/uL — ABNORMAL HIGH (ref 4.0–10.5)
nRBC: 0.3 % — ABNORMAL HIGH (ref 0.0–0.2)

## 2020-12-31 LAB — HEPARIN LEVEL (UNFRACTIONATED): Heparin Unfractionated: 0.55 IU/mL (ref 0.30–0.70)

## 2020-12-31 LAB — PROTIME-INR
INR: 1.1 (ref 0.8–1.2)
Prothrombin Time: 14.1 seconds (ref 11.4–15.2)

## 2020-12-31 LAB — APTT: aPTT: 111 seconds — ABNORMAL HIGH (ref 24–36)

## 2020-12-31 MED ORDER — CHLORHEXIDINE GLUCONATE CLOTH 2 % EX PADS: 6.0000 | MEDICATED_PAD | Freq: Every day | CUTANEOUS | Status: DC

## 2020-12-31 MED ORDER — MIDODRINE HCL 5 MG PO TABS
5.0000 mg | ORAL_TABLET | Freq: Three times a day (TID) | ORAL | Status: DC
Start: 2020-12-31 — End: 2021-01-02
  Administered 2020-12-31 – 2021-01-01 (×4): 5 mg via ORAL
  Filled 2020-12-31 (×5): qty 1

## 2020-12-31 MED ORDER — MEGESTROL ACETATE 40 MG PO TABS
40.0000 mg | ORAL_TABLET | Freq: Every day | ORAL | Status: DC
  Administered 2020-12-31 – 2021-01-01 (×2): 40 mg via ORAL
  Filled 2020-12-31 (×2): qty 1

## 2020-12-31 MED ORDER — FUROSEMIDE 80 MG PO TABS
80.0000 mg | ORAL_TABLET | Freq: Two times a day (BID) | ORAL | Status: DC
  Administered 2020-12-31 – 2021-01-01 (×2): 80 mg via ORAL
  Filled 2020-12-31 (×2): qty 1

## 2020-12-31 NOTE — Progress Notes (Signed)
HOSPITAL MEDICINE OVERNIGHT EVENT NOTE    Nursing has notified me that patient had a single episode of spitting up approximately 30 cc of dark blood.  No evidence of coffee-ground's.  Patient did not vomit.  Exact source of the bleeding is not entirely clear at this point.  Possibly coming from the gingiva or perhaps the posterior oropharynx.  Unlikely to be a gastrointestinal bleed in the absence of vomiting, evidence of coffee-ground or concurrent melena.  Patient is also voiced concerns about his blood pressures which are currently averaging in the 61B systolic.  I have reviewed the patient's chart which shows that this has mostly been the case for at least several days.  Rapid response is already evaluated the patient at the bedside and ordered a 250 cc bolus of fluids.  We will follow up on with a repeat blood pressures.  Patient was just initiated on Coumadin yesterday and is concurrently on a heparin infusion for an acute DVT.  Will obtain a repeat set of labs now including CBC and coagulation profile.  We will monitor for recurrence.  We will follow up on labs.  Will follow blood pressures status post bolus.  Vernelle Emerald  MD [facility]

## 2020-12-31 NOTE — Progress Notes (Signed)
PROGRESS NOTE    Glenn Gonzalez  ZSW:[id number]  DOB: May 19, 1961  DOA: 12/27/2020 PCP: Lajean Manes, MD Outpatient Specialists:   Hospital course:  Glenn Gonzalez a 60 y.o.year old malewith medical history significant for Hodgkin lymphoma, cardiac transplant on tacrolimus and prednisone, chronic combined systolic/diastolic CHF, GERD, high-grade T1 bladder cancer with recent hospitalization from 32/20-2/5 for AKI complicated by hydronephrosis in the setting of new retroperitoneal lymphadenopathy. Patient underwent stent placement by urology and was treated for postoperative multifocal pneumonia.  He presented from urology office with reports of increased back pain not well controlled on oral pain medication as well as generalized fatigue, poor appetite. He was brought in under observation due to concern for back pain in the setting of known retroperitoneal lymphadenopathy.  Hospital course complicated by acute hyponatremia in the setting of poor appetite requiring IV fluids and persistent leukocytosis. CT abdomen imaging consistent with retroperitoneal adenopathy and dilation of right renal pelvis and collecting systems concerning for urothelial neoplasm with extensive metastatic process. Found to have acute right lower extremity DVT on venous duplex started on heparin on 1/10.Has opted for dialysis while awaiting [facility] response.  1/17: Tunneled dialysis cath placed by IR, initiated hemodialysis, first 1/17, tolerated 1/18: second hemodialysis planned 1/21: Chemotherapy, second planned today 1/21, undergoing HD 1/28: Patient received day 15 of cycle 1 of chemotherapy.   Subjective: Patient seen and examined this morning.  He complains of poor appetite.  Events from last night noted where he was noted to have a single episode of spitting up approximately 30 cc of dark blood.  No evidence of vomiting.  Patient has not had any further episodes like that.  He believes that he  has bleeding from mouth ulcers.  Teeth currently stained with the blood.  No active bleeding currently.  He has no other complaint.   Objective: Vitals:   12/30/20 2307 12/31/20 0010 12/31/20 0510 12/31/20 1003  BP: (!) 83/58 (!) 85/60 (!) 82/57 (!) 86/52  Pulse: 97 99 99 (!) 108  Resp: 18 18 19 18   Temp: 98.7 F (37.1 C)  97.6 F (36.4 C) (!) 97.5 F (36.4 C)  TempSrc: Oral  Oral   SpO2: 94%  96% 93%  Weight:      Height:        Intake/Output Summary (Last 24 hours) at 12/31/2020 1018 Last data filed at 12/31/2020 0900 Gross per 24 hour  Intake 1001.98 ml  Output 225 ml  Net 776.98 ml   Filed Weights   12/28/20 0444 12/28/20 2137 12/29/20 0500  Weight: 101.6 kg 104.8 kg 104.8 kg     Exam:  General exam: Appears calm and comfortable  Respiratory system: Clear to auscultation. Respiratory effort normal. Cardiovascular system: S1 & S2 heard, RRR. No JVD, murmurs, rubs, gallops or clicks.  +3 pitting edema bilateral lower extremity Gastrointestinal system: Abdomen is nondistended, soft and nontender. No organomegaly or masses felt. Normal bowel sounds heard. Central nervous system: Alert and oriented. No focal neurological deficits. Extremities: Symmetric 5 x 5 power. Skin: No rashes, lesions or ulcers.  Psychiatry: Judgement and insight appear normal. Mood & affect appropriate.   Assessment & Plan:   Unfortunate 60 year old man status post cardiac transplant now with metastatic high-grade bladder CA  Constipation Resolved.   Persistent hypotension: Patient seems to have persistent hypotension since last few days.  Although he has no symptoms.  He has been on only 10 mg midodrine on dialysis days.  We will start him on midodrine  5 mg p.o. 3 times daily.  Anasarca and electrolyte abnormalities Per hemodialysis and nephrology service  History of cardiac transplant Continue tacrolimus, to keep goal levels between 5-8 Steroid taper has been completed and is presently  on prednisone 5 mg daily. Patient is followed by the [facility] transplant team Dr. Kae Heller has been consulted and is providing consultation.  Acute urinary retention with gross hematuria: Patient had acute urinary retention with some hematuria on 12/26/2020 unsure whether hematuria was secondary to traumatic due to In-N-Out catheter.  Patient did not consent to the indwelling Foley catheter as was recommended by urology.  No further episodes of retention or hematuria.  Copied from previous notes: Retroperitoneal adenopathy and perinephric nodularity, high-grade bladder CA, recently diagnosed -Status post IR guided retroperitoneal biopsy, pathology positive for poorly differentiated carcinoma.  Recent hospitalization 02/72-5/3 for AKI complicated by hydronephrosis in the setting of new retroperitoneal LAD, underwent stent placement. - Urology and oncology Consulted but no recent notes. --Startedon palliative chemo 1/14, palliative medicine following  -  C1D8 Padcev chemo  On 1/21, next due 1/28   AKI on CKD stage 3a, requiring dialysis, new problem  -Baseline creatinine 1.2-1.4 from 08/2020, 1.8 from last hospitalization -Suspect lymphatic obstruction due to malignancy, low albumin, renal function worsening over the last few weeks.  Nephrology was consulted. -Tunneled HD line was placed on the 1/17 and he has been started on dialysis. Bladder was noted to be distended on 1/20 he was seen by urology who recommended placement of a Foley if he was unable to void.  He is getting intermittent HD.  Nephrology also gave a trial of high-dose Lasix of 120 mg IV twice daily yesterday with no good response less poor urine output so diuretics were discontinued today.  Per nephrology, he will require long-term IHD.  Acute right lower extremity DVT -Continue IV heparin, H&H stable Has been on bridging Lovenox and started on Coumadin yesterday.  Continue that.  Monitor closely as he had an episode of bleeding from  his mouth.  Elevated ALT- monitor as immunosuppressive medications cleared hepatically   Small retroperitoneal hemorrhage status post biopsy -H&H stable  Small pericardial effusion -Incidentally noted on echocardiogram, without tamponade physiology  Chronic combined systolic and diastolic heart failure, history of cardiac transplant -Patient is status post heart transplant, prior EF 10% - EF has now corrected to 60 to 65% -Volume control with HD, initiated on 1/17 -Dr. Haroldine Laws was contacted on admission as he has been in contact with transplant team as below.  History of cardiac transplant -Continue tacrolimus, to keep goal levels between 5-8 -Continue dexamethasone, reduced dose to 4 mg daily by Dr. Irene Limbo on 1/18 and okay to wean down to his baseline prednisone 5 mg daily--per note from Dr. Jeffie Pollock, to taper as quickly as possible which per Dr. Grier Mitts note okay to do so--he was tapered and has been on prednisone 5 mg p.o. daily since 12/26/2020. -Cardiology was consulted, per Dr Haroldine Laws, EF is normal, recommended to continue the immunosuppressive regimen and follow the tacrolimus levels. Dr. Haroldine Laws has notified/updated the transplant team at [facility].   Leukocytosis-clinically without infection and has been stable.  Could very well be due to steroids and malignancy itself.  Improving.  He is afebrile.  Hypothyroidism -Continue Synthroid  Goal of care: Patient apparently was DNR up until 12/25/20 when he talked to the chaplain and switched his CODE STATUS to full code.  Patient seems to have unrealistic expectations.  Will reconsult palliative care to discuss goals  of care again.  Poor appetite: Will start on Megace.  Code Status:  Full code DVT Prophylaxis: IV heparin Family Communication:  None at bedside.  Discussed in length with patient.  He is alert and oriented and competent. DVT prophylaxis: Heparin Code Status: DNR Family Communication: None Disposition Plan:    Patient is from: Home  Anticipated Discharge Location: TBD  Barriers to Discharge: Ongoing need for hemodialysis with anasarca and on palliative chemotherapy, needs SNF and likely hemodialysis assignment  Is patient medically stable for Discharge: No   Procedures:  HD cath 1.17 IR guided biopsy 1/06  Consultants:   Nephrology Oncology Palliative IR   Data Reviewed:  Basic Metabolic Panel: Recent Labs  Lab 12/25/20 0500 12/26/20 0429 12/28/20 0417 12/29/20 0516 12/30/20 0436  NA 129* 132* 133* 132* 135  K 4.7 5.3* 4.2 4.6 3.7  CL 94* 96* 97* 97* 100  CO2 20* 19* 20* 20* 22  GLUCOSE 100* 94 95 118* 118*  BUN 74* 87* 61* 81* 60*  CREATININE 3.33* 3.83* 4.17* 5.17* 4.11*  CALCIUM 8.7* 8.9 8.3* 8.4* 8.2*  PHOS 4.1  --   --   --   --    Liver Function Tests: Recent Labs  Lab 12/25/20 0500 12/26/20 0429 12/28/20 0417 12/30/20 0436  AST  --  60* 51* 76*  ALT  --  19 22 34  ALKPHOS  --  58 53 183*  BILITOT  --  0.9 1.2 1.5*  PROT  --  5.7* 5.1* 4.8*  ALBUMIN 2.9* 3.1* 2.7* 2.7*   No results for input(s): LIPASE, AMYLASE in the last 168 hours. No results for input(s): AMMONIA in the last 168 hours. CBC: Recent Labs  Lab 12/27/20 0438 12/28/20 0417 12/29/20 0516 12/30/20 0436 12/31/20 0116  WBC 15.4* 11.0* 10.5 11.9* 10.7*  NEUTROABS  --  9.4*  --   --  9.0*  HGB 13.1 11.0* 9.8* 9.6* 8.9*  HCT 41.1 33.6* 28.6* 29.9* 27.2*  MCV 97.4 96.6 95.7 96.5 96.1  PLT 272 239 234 231 207   Cardiac Enzymes: No results for input(s): CKTOTAL, CKMB, CKMBINDEX, TROPONINI in the last 168 hours. BNP (last 3 results) No results for input(s): PROBNP in the last 8760 hours. CBG: No results for input(s): GLUCAP in the last 168 hours.  No results found for this or any previous visit (from the past 240 hour(s)).    Studies: No results found.   Scheduled Meds: . allopurinol  200 mg Oral Daily  . artificial tears  1 application Both Eyes QHS  . azelastine  1 spray  Each Nare Daily   And  . fluticasone  1 spray Each Nare Daily  . cetirizine  10 mg Oral QHS  . Chlorhexidine Gluconate Cloth  6 each Topical Q0600  . dronabinol  2.5 mg Oral BID AC  . fentaNYL  1 patch Transdermal Q72H  . heparin sodium (porcine)  1,000 Units Intravenous Q T,Th,Sa-HD  . levothyroxine  125 mcg Oral Daily  . metoCLOPramide  5 mg Oral TID AC & HS  . midodrine  5 mg Oral TID WC  . nystatin  5 mL Oral QID  . polyvinyl alcohol  1 drop Both Eyes TID  . predniSONE  5 mg Oral Q breakfast  . Tacrolimus ER  1.5 mg Oral Daily  . traZODone  100 mg Oral QHS  . warfarin  5 mg Oral q1600  . Warfarin - Pharmacist Dosing Inpatient   Does not apply [id number]  Continuous Infusions: . sodium chloride    . heparin 1,350 Units/hr (12/30/20 2340)    Principal Problem:   Metastatic urothelial carcinoma ([place]) Active Problems:   SYSTOLIC HEART FAILURE, CHRONIC   Acute kidney injury superimposed on CKD (HCC)   Hypothyroidism   Hyponatremia   Back pain   Retroperitoneal lymphadenopathy   Liver lesion   Acute on chronic combined systolic and diastolic CHF (congestive heart failure) (HCC)   Acute DVT of right tibial vein (HCC)   Retroperitoneal hemorrhage   Neoplasm related pain   Encounter for antineoplastic chemotherapy   Abdominal distension   Pressure injury of skin     Maddelyn Rocca, [facility]  If 7PM-7AM, please contact night-coverage [email or web address] 12/31/2020, 10:18 AM    LOS: 24 days

## 2020-12-31 NOTE — Progress Notes (Signed)
[facility] Progress Note     Assessment/ Plan:   1. Dialysis Dependent AKI on CKD 3a - b/l creat 1.2- 1.4 from oct 2021.   Renal function worsening over last few weeks. UA +wbc's and rbc's, no sig proteinuria. Imaging shows stable R ureteral stent and stable R collecting system dilatation. Has new significant leg edema. ECHO showed normal EF.   Creat 1.8 on admission > up to 2.3 then 2.8 after lasix tried, then 3.1 >  3.2 > 3.4 > up to 4.5 -- HD.  He likely progressed due to ATN  IR placed [facility] 1/17 R IJ, HD #1 1/17  HD #3 1/21: 3hr, intra-dialytic hypotension therefore order midorine 10 mg and albumin 25 g. UF as tolerated.  HD #4 1/24 again limited by hypotension limiting UF despite midodrine and albumin support.  1/27 tolerated UF 2.6L; 1/29 tolerated UF 1.5L in chair, midodrine support, no albumin since not available outpt  Next HD 2/1 ; come in chair-  BUN and crt still rising in between HD treatments-  He says dialysis is a breeze  2. High-grade bladder cancer- recent dx , treated w/ BCG in Aug 2021, sp TURBT x 2 Aug and Sept. New RP adenopathy w/ R hydro in Dec 2021 > cysto and R ureteral stent per urology. 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 Onc consulting w/ [facility].  Has started chemotherapy (palliative) with Dr. Irene Limbo 3. H/o Hodgkin's lymphoma - remote 4. H/o cardiac transplant - taking prograf 1.5 mg /d and pred 5 /d per CV 5. Acute RLE DVT - per primary team. Positive for superficial venous thrombosis/thrombophlebitis of the mid right leg greater saphenous vein but neg for DVT. On heparin gtt and going to coumadin  6. Chronic diast CHF - had prior LVEF 10% but now has corrected to 60-65% 7. Hypotension:  Modest hypotension which is being managed with midodrine.  Adrenal function ok.  Will add on some lasix in order to minimize what needs to be taken off with HD  8. Anemia:  Hb trending down past few days.  No obvious  bleeding.  No ESA with malignancy.  Transfused > 7.  Hemoccult. 9. Bones-  Will check PTH-  Phos OK , no binder  Dispo - He's certainly very deconditioned with an incurable metastatic malignancy receiving palliative chemotherapy.  He wishes to proceed forward with full therapies.  CLIP to [place] was paused last week with hypotension, concern he may not tolerated outpt HD but we can reopen that this week.  Palliative care is following.  Last week he changed from DNR to full code.     Subjective:   BP 80s again this AM- also note that he had hemoptysis- was given a bolus of 250-  He does not feel badly when BP is low-  Says is making urine but needing to drink a lot due to being dry and bad GERD      Objective:   BP (!) 86/52 (BP Location: Right Arm)   Pulse (!) 108   Temp (!) 97.5 F (36.4 C)   Resp 18   Ht 6\' 2"  (1.88 m)   Wt 104.8 kg   SpO2 93%   BMI 29.66 kg/m   Intake/Output Summary (Last 24 hours) at 12/31/2020 1009 Last data filed at 12/31/2020 0900 Gross per 24 hour  Intake 1001.98 ml  Output 225 ml  Net 776.98 ml   Weight change:   Physical Exam: Genalert, NAD  No jvd or bruits Chest clear bilatto bases RRR no MRG Abdprotuberant, no ascites by exam,  +BS Ext3+ pitting bilat LE from feet to hips to abd/chest wall-  Feet in boots  Neuro is alert and conversant TDC site clean.   Imaging: No results found.  Labs: BMET Recent Labs  Lab 12/25/20 0500 12/26/20 0429 12/28/20 0417 12/29/20 0516 12/30/20 0436  NA 129* 132* 133* 132* 135  K 4.7 5.3* 4.2 4.6 3.7  CL 94* 96* 97* 97* 100  CO2 20* 19* 20* 20* 22  GLUCOSE 100* 94 95 118* 118*  BUN 74* 87* 61* 81* 60*  CREATININE 3.33* 3.83* 4.17* 5.17* 4.11*  CALCIUM 8.7* 8.9 8.3* 8.4* 8.2*  PHOS 4.1  --   --   --   --    CBC Recent Labs  Lab 12/28/20 0417 12/29/20 0516 12/30/20 0436 12/31/20 0116  WBC 11.0* 10.5 11.9* 10.7*  NEUTROABS 9.4*  --   --  9.0*  HGB 11.0* 9.8* 9.6* 8.9*  HCT 33.6* 28.6* 29.9*  27.2*  MCV 96.6 95.7 96.5 96.1  PLT 239 234 231 207    Medications:    . allopurinol  200 mg Oral Daily  . artificial tears  1 application Both Eyes QHS  . azelastine  1 spray Each Nare Daily   And  . fluticasone  1 spray Each Nare Daily  . cetirizine  10 mg Oral QHS  . Chlorhexidine Gluconate Cloth  6 each Topical Q0600  . dronabinol  2.5 mg Oral BID AC  . fentaNYL  1 patch Transdermal Q72H  . heparin sodium (porcine)  1,000 Units Intravenous Q T,Th,Sa-HD  . levothyroxine  125 mcg Oral Daily  . metoCLOPramide  5 mg Oral TID AC & HS  . midodrine  5 mg Oral TID WC  . nystatin  5 mL Oral QID  . polyvinyl alcohol  1 drop Both Eyes TID  . predniSONE  5 mg Oral Q breakfast  . Tacrolimus ER  1.5 mg Oral Daily  . traZODone  100 mg Oral QHS  . warfarin  5 mg Oral q1600  . Warfarin - Pharmacist Dosing Inpatient   Does not apply [place], MD  12/31/2020, 10:09 AM

## 2020-12-31 NOTE — Significant Event (Signed)
Rapid Response Event Note   Reason for Call : Hypotension Initial Focused Assessment:  Joelene Millin RN notified me regarding pt with sBP 80s with possible blood loss. No large blood loss, pt spit up 30cc dark blood. Pt is awake and alert, but confused. I instructed Joelene Millin to give a 250 cc NS bolus until she heard back from Dr. Cyd Silence. I was with another patient, and will assess Mr. [place] when I can. Joelene Millin contacted Dr. Cyd Silence for orders. I was able to follow up with Joelene Millin folowing the bolus and Dr. Darrick Grinder input. Pt is awake and alert, but SBPs are still in the 80s despite bolus. Orders were received to check labs for blood loss since pt is on anticoagulation for DVT.  I told Joelene Millin to contact me if the patient dropped BP further or the is a decreased LOC.      Interventions:  -NS 250cc bolus  Plan of Care:  -Follow up with Dr. Cyd Silence after labs are resulted.   Event Summary:   MD Notified:  Call Time: [employer] Time: 0100 End Time: 0105  Madelynn Done, RN

## 2020-12-31 NOTE — Progress Notes (Signed)
[place] for heparin / Warfarin Indication: DVT  Labs: Recent Labs    12/29/20 0516 12/30/20 0436 12/31/20 0116  HGB 9.8* 9.6* 8.9*  HCT 28.6* 29.9* 27.2*  PLT 234 231 207  APTT  --   --  111*  LABPROT  --   --  14.1  INR  --   --  1.1  HEPARINUNFRC 0.44 0.63 0.55  CREATININE 5.17* 4.11*  --     Assessment: 60 yo male on heparin drip for RLE DVT.  Heparin level therapeutic. Warfarin started 1/30  Hgb now trending down-8.9,  pltc wnl stable.   Blood clots with in and out foley, now resolved.   ? Episode of spitting up blood 1/31  Goal of Therapy:  Heparin level 0.3-0.7 units/ml  Monitor platelets by anticoagulation protocol: yes INR 2 to 3    Plan:  Continue heparin infusion at 1350 units/hr Continue Warfarin 5 mg po daily  Daily heparin level, INR, CBC  Thank you Anette Guarneri, PharmD 12/31/2020, 8:40 AM

## 2020-12-31 NOTE — Progress Notes (Signed)
Patient ID: Glenn Gonzalez, male   DOB: 04/13/1961, 60 y.o.   MRN: [id number]   Reviewed medical records  This NP visited patient at the bedside as a follow up for palliative medicine needs and emotional support. Initial PMT consult on 12-09-20  Glenn Gonzalez a 60 y.o.year old malewith medical history significant for Hodgkin lymphoma/remote, cardiac transplant on tacrolimus and prednisone, chronic combined systolic/diastolic CHF, GERD, high-grade T1 bladder cancer with recent hospitalization from 10/31-5/9 for AKI complicated by hydronephrosis in the setting of new retroperitoneal lymphadenopathy. Patient underwent stent placement by urology and was treated for postoperative multifocal pneumonia.  He presented from urology office with reports of increased back pain not well controlled on oral pain medication as well as generalized fatigue, poor appetite. He was brought in under observation due to concern for back pain in the setting of known retroperitoneal lymphadenopathy.  Hospital course complicated by acute hyponatremia in the setting of poor appetite requiring IV fluids and persistent leukocytosis. CT abdomen imaging consistent with retroperitoneal adenopathy and dilation of right renal pelvis and collecting systems concerning for urothelial neoplasm with extensive metastatic process. Found to have acute right lower extremity DVT on venous duplex started on heparin on 1/10.Has opted for dialysis while awaiting [facility] response.  1/17: Tunneled dialysis cath placed by IR, initiated hemodialysis, first 1/17, tolerated 1/18: second hemodialysis planned 1/21: Chemotherapy IP 1-28: Chemotherapy, IP 1/31-tolerating HD, next treatment 2-1  Complex serious medical situation with overall poor prognosis.   Patient  is weak and lethargic, o/c voiced at this time.    Patient faces ongoing treatment option decisions, advanced directive decisions and anticipatory care needs.  He tells me he  is open to all offered and available medical interventions to prolong life.   Therapeutic listening and emotional support.  Education offered on the  importance of continued conversation with his family and the medical providers regarding overall plan of care and treatment options,  ensuring decisions are within the context of the patients values and GOCs.  Questions and concerns addressed   Emotional support offered  Total time spent on the unit was 25 minutes      PMT will continue to support holistically  Left message for Dr Doristine Bosworth  Greater than 50% of the time was spent in counseling and coordination of care  Wadie Lessen NP  Palliative Medicine Team Team Phone # [phone] Pager [phone]

## 2020-12-31 NOTE — Progress Notes (Signed)
   12/31/20 1054  Assess: MEWS Score  Temp 98.4 F (36.9 C)  BP (!) 82/54  Pulse Rate (!) 109  Resp 18  Level of Consciousness Alert  SpO2 95 %  O2 Device Room Air  Patient Activity (if Appropriate) In bed  Assess: MEWS Score  MEWS Temp 0  MEWS Systolic 1  MEWS Pulse 1  MEWS RR 0  MEWS LOC 0  MEWS Score 2  MEWS Score Color Yellow  Assess: if the MEWS score is Yellow or Red  Were vital signs taken at a resting state? Yes  Focused Assessment No change from prior assessment  Early Detection of Sepsis Score *See Row Information* Low  MEWS guidelines implemented *See Row Information* Yes  Treat  Pain Scale 0-10  Pain Score 0  Take Vital Signs  Increase Vital Sign Frequency  Yellow: Q 2hr X 2 then Q 4hr X 2, if remains yellow, continue Q 4hrs  Escalate  MEWS: Escalate Yellow: discuss with charge nurse/RN and consider discussing with provider and RRT  Notify: Charge Nurse/RN  Name of Charge Nurse/RN Notified [place], RN  Date Charge Nurse/RN Notified 12/31/20  Time Charge Nurse/RN Notified 1110

## 2021-01-01 DIAGNOSIS — C7910 Secondary malignant neoplasm of unspecified urinary organs: Secondary | ICD-10-CM | POA: Diagnosis not present

## 2021-01-01 LAB — CBC
HCT: 25 % — ABNORMAL LOW (ref 39.0–52.0)
Hemoglobin: 7.9 g/dL — ABNORMAL LOW (ref 13.0–17.0)
MCH: 30.6 pg (ref 26.0–34.0)
MCHC: 31.6 g/dL (ref 30.0–36.0)
MCV: 96.9 fL (ref 80.0–100.0)
Platelets: 217 10*3/uL (ref 150–400)
RBC: 2.58 MIL/uL — ABNORMAL LOW (ref 4.22–5.81)
RDW: 16.9 % — ABNORMAL HIGH (ref 11.5–15.5)
WBC: 6.4 10*3/uL (ref 4.0–10.5)
nRBC: 0.5 % — ABNORMAL HIGH (ref 0.0–0.2)

## 2021-01-01 LAB — PROTIME-INR
INR: 1.5 — ABNORMAL HIGH (ref 0.8–1.2)
Prothrombin Time: 17.4 seconds — ABNORMAL HIGH (ref 11.4–15.2)

## 2021-01-01 LAB — RENAL FUNCTION PANEL
Albumin: 2.4 g/dL — ABNORMAL LOW (ref 3.5–5.0)
Anion gap: 17 — ABNORMAL HIGH (ref 5–15)
BUN: 112 mg/dL — ABNORMAL HIGH (ref 6–20)
CO2: 20 mmol/L — ABNORMAL LOW (ref 22–32)
Calcium: 8.2 mg/dL — ABNORMAL LOW (ref 8.9–10.3)
Chloride: 97 mmol/L — ABNORMAL LOW (ref 98–111)
Creatinine, Ser: 6.07 mg/dL — ABNORMAL HIGH (ref 0.61–1.24)
GFR, Estimated: 10 mL/min — ABNORMAL LOW (ref >60)
Glucose, Bld: 112 mg/dL — ABNORMAL HIGH (ref 70–99)
Phosphorus: 3.9 mg/dL (ref 2.5–4.6)
Potassium: 4.5 mmol/L (ref 3.5–5.1)
Sodium: 134 mmol/L — ABNORMAL LOW (ref 135–145)

## 2021-01-01 LAB — HEPARIN LEVEL (UNFRACTIONATED): Heparin Unfractionated: 0.46 IU/mL (ref 0.30–0.70)

## 2021-01-01 MED ORDER — HYDROMORPHONE HCL 1 MG/ML IJ SOLN
1.0000 mg | INTRAMUSCULAR | Status: DC | PRN
  Filled 2021-01-01: qty 1

## 2021-01-01 MED ORDER — KETOROLAC TROMETHAMINE 15 MG/ML IJ SOLN
15.0000 mg | Freq: Four times a day (QID) | INTRAMUSCULAR | Status: DC | PRN
  Administered 2021-01-01: 15 mg via INTRAVENOUS
  Filled 2021-01-01: qty 1

## 2021-01-01 MED ORDER — ALPRAZOLAM 0.25 MG PO TABS
0.2500 mg | ORAL_TABLET | Freq: Two times a day (BID) | ORAL | Status: DC | PRN
  Administered 2021-01-01: 0.25 mg via ORAL
  Filled 2021-01-01: qty 1

## 2021-01-01 DEATH — deceased

## 2021-01-02 LAB — PARATHYROID HORMONE, INTACT (NO CA): PTH: 79 pg/mL — ABNORMAL HIGH (ref 15–65)

## 2021-01-11 ENCOUNTER — Other Ambulatory Visit: Payer: 59

## 2021-01-11 ENCOUNTER — Ambulatory Visit: Payer: 59 | Admitting: Hematology

## 2021-01-18 ENCOUNTER — Ambulatory Visit: Payer: 59

## 2021-01-25 ENCOUNTER — Ambulatory Visit: Payer: 59

## 2021-01-29 NOTE — Progress Notes (Signed)
Noted that family and patient are requesting full comfort care measures at this point. Discussed case with attending MD Dr. Doristine Bosworth and we are in agreement for PT to sign off at this point. Thank you for the opportunity to participate in his care!  Windell Norfolk, DPT, PN1   Supplemental Physical Therapist [facility]    Pager [phone] Acute Rehab Office [phone]

## 2021-01-29 NOTE — Progress Notes (Signed)
Patient expired at 17:56pm, MD was notify. As witness by another RN Eboni Blue. Family member at the bedside.

## 2021-01-29 NOTE — Progress Notes (Signed)
Physical Therapy Treatment Patient Details Name: Glenn Gonzalez MRN: [id number] DOB: 09/21/1961 Today's Date: 01/22/21    History of Present Illness Glenn Gonzalez is a 60 y.o. male with medical history of hydronephrosis, hodgkin's lymphoma, colon cancer, bladder cancer, history of heart transplant, gout, depression, hypothyroidism. presents with c/o pain pain.    PT Comments    Patient received in bed, relays to me that he is very anxious- MD alerted. He was consistently hypotensive during session in the 70s/40s-50s in supine with HOB about 30-40 degrees, asymptomatic. Tolerated bed level exercises and stretches however needed fairly constant simple cues to participate, otherwise would drift off to sleep. RN aware of patient status and hypotension during session; HOB left elevated to help manage bleeding in mouth/active secretions and prevent aspiration event. Left in bed positioned to comfort with all needs met, spouse present. Unfortunately I do not feel that this gentleman has a good PT prognosis- discussed case with spouse, who is agreeable to [place] performing sessions as tolerated/reasonable given medical status as long as patient is agreeable to it, to including positioning and ways to try to improve his comfort. Will continue to follow.    Follow Up Recommendations  Other (comment);Supervision/Assistance - 24 hour (hospice with HHPT if hospice allows)     Equipment Recommendations  Rolling walker with 5" wheels;3in1 (PT);Wheelchair (measurements PT);Wheelchair cushion (measurements PT);Other (comment);Hospital bed (hoyer lift with pad)    Recommendations for Other Services       Precautions / Restrictions Precautions Precautions: Fall Precaution Comments: high pain and anxiety, watch BP, very fragile medically Restrictions Weight Bearing Restrictions: No    Mobility  Bed Mobility               General bed mobility comments: deferred- hypotension  Transfers                  General transfer comment: deferred- hypotension  Ambulation/Gait             General Gait Details: deferred- hypotension   Stairs             Wheelchair Mobility    Modified Rankin (Stroke Patients Only)       Balance                                            Cognition Arousal/Alertness: Lethargic Behavior During Therapy: Anxious Overall Cognitive Status: Impaired/Different from baseline Area of Impairment: Orientation;Attention;Memory;Following commands;Safety/judgement;Problem solving;Awareness                 Orientation Level: Disoriented to;Time;Situation Current Attention Level: Focused Memory: Decreased short-term memory Following Commands: Follows one step commands inconsistently;Follows one step commands with increased time Safety/Judgement: Decreased awareness of deficits Awareness: Intellectual Problem Solving: Slow processing;Decreased initiation;Difficulty sequencing;Requires verbal cues;Requires tactile cues General Comments: more lethargic today and confused- tells me it is Februrary 22nd in teh 1990s. Needed constant cuing to participate in bed level tasks and seemed to drift off when not constantly stimulated. Very anxious.      Exercises      General Comments General comments (skin integrity, edema, etc.): deferred all EOB/OOB activities today due to ongoing hypotension in the 70s/40s-50s in supine with HOB elevated to about 30-40 degrees      Pertinent Vitals/Pain Pain Assessment: Faces Faces Pain Scale: Hurts even more Pain Location: abdomen, back, generalized Pain Descriptors / Indicators:  Aching;Grimacing;Guarding Pain Intervention(s): Limited activity within patient's tolerance;Monitored during session    Home Living                      Prior Function            PT Goals (current goals can now be found in the care plan section) Acute Rehab PT Goals Patient Stated Goal:  try to get stronger PT Goal Formulation: With patient Time For Goal Achievement: 01/09/21 Potential to Achieve Goals: Fair Progress towards PT goals: Not progressing toward goals - comment (limited by hypotension/lethargy)    Frequency    Min 2X/week      PT Plan Current plan remains appropriate    Co-evaluation              AM-PAC PT "6 Clicks" Mobility   Outcome Measure  Help needed turning from your back to your side while in a flat bed without using bedrails?: A Lot Help needed moving from lying on your back to sitting on the side of a flat bed without using bedrails?: Total Help needed moving to and from a bed to a chair (including a wheelchair)?: Total Help needed standing up from a chair using your arms (e.g., wheelchair or bedside chair)?: Total Help needed to walk in hospital room?: Total Help needed climbing 3-5 steps with a railing? : Total 6 Click Score: 7    End of Session   Activity Tolerance: Treatment limited secondary to medical complications (Comment) (hypotension/lethargy) Patient left: in bed;with call bell/phone within reach;with family/visitor present Nurse Communication: Mobility status;Other (comment) (hypotension during session) PT Visit Diagnosis: Difficulty in walking, not elsewhere classified (R26.2);Muscle weakness (generalized) (M62.81);Unsteadiness on feet (R26.81);Other abnormalities of gait and mobility (R26.89)     Time: 7829-5621 PT Time Calculation (min) (ACUTE ONLY): 28 min  Charges:  $Therapeutic Exercise: 8-22 mins $Self Care/Home Management: 8-22                     Windell Norfolk, DPT, PN1   Supplemental Physical Therapist [place]    Pager [phone] Acute Rehab Office [phone]

## 2021-01-29 NOTE — Progress Notes (Signed)
HOSPITAL MEDICINE OVERNIGHT EVENT NOTE    Notified by nursing that patient had become increasingly agitated throughout the evening.  The symptoms are similar to last evening except this time, instead of calling his physician multiple times he has called 911 on at least 3 occasions.  [place] responded to the patient's room but shortly after their arrival they noted that the patient was not of sound mind and actively hallucinating.  I have come to evaluate the patient at bedside.  Patient exhibits hurried speech, is visibly agitated and exhibits tangential thinking.  Considering the timing of the patient's sudden and dramatic bouts of confusion this is seemingly delirium in the setting of acute illness.  After sitting with the patient and speaking to him for a good long while, he is declining all medications to help with his agitation.  Patient is currently not pulling at any of his lines and not exhibiting any attempt to inflict self-harm so I am fine with pursuing conservative measures and minimizing his agitation.  We will continue to monitor patient closely, minimizing uncomfortable stimuli and redirecting patient were possible.  Vernelle Emerald  MD [facility]

## 2021-01-29 NOTE — Progress Notes (Signed)
HEMATOLOGY-ONCOLOGY PROGRESS NOTE  SUBJECTIVE: Chart reviewed and events noted.  The patient has decided not to pursue additional dialysis.  He has now transition to comfort measures.  The patient received pain medication just prior to my visit and did not awaken.  Appears comfortable.  His mother and brother are at the bedside.  REVIEW OF SYSTEMS:   Unable to obtain  I have reviewed the past medical history, past surgical history, social history and family history with the patient and they are unchanged from previous note.   PHYSICAL EXAMINATION: ECOG PERFORMANCE STATUS: 4 - Bedbound  Vitals:   Jan 23, 2021 1058 23-Jan-2021 1110  BP: (!) 76/57 (!) 78/57  Pulse: (!) 115 (!) 113  Resp:  18  Temp:  98.2 F (36.8 C)  SpO2:  94%   Filed Weights   12/28/20 2137 12/29/20 0500 23-Jan-2021 0500  Weight: 104.8 kg 104.8 kg 98.4 kg    Intake/Output from previous day: 01/31 0701 - 02/01 0700 In: 989.7 [P.O.:660; I.V.:329.7] Out: -   GENERAL: Sedated, appears comfortable Respiratory: The patient is having periods of agonal breathing.  LABORATORY DATA:  I have reviewed the data as listed CMP Latest Ref Rng & Units 2021/01/23 12/30/2020 12/29/2020  Glucose 70 - 99 mg/dL 112(H) 118(H) 118(H)  BUN 6 - 20 mg/dL 112(H) 60(H) 81(H)  Creatinine 0.61 - 1.24 mg/dL 6.07(H) 4.11(H) 5.17(H)  Sodium 135 - 145 mmol/L 134(L) 135 132(L)  Potassium 3.5 - 5.1 mmol/L 4.5 3.7 4.6  Chloride 98 - 111 mmol/L 97(L) 100 97(L)  CO2 22 - 32 mmol/L 20(L) 22 20(L)  Calcium 8.9 - 10.3 mg/dL 8.2(L) 8.2(L) 8.4(L)  Total Protein 6.5 - 8.1 g/dL - 4.8(L) -  Total Bilirubin 0.3 - 1.2 mg/dL - 1.5(H) -  Alkaline Phos 38 - 126 U/L - 183(H) -  AST 15 - 41 U/L - 76(H) -  ALT 0 - 44 U/L - 34 -    Lab Results  Component Value Date   WBC 6.4 2021-01-23   HGB 7.9 (L) 2021-01-23   HCT 25.0 (L) 01-23-21   MCV 96.9 01-23-21   PLT 217 01-23-21   NEUTROABS 9.0 (H) 12/31/2020    CT ABDOMEN PELVIS WO CONTRAST  Result Date:  12/21/2020 CLINICAL DATA:  Cancer of unknown primary in this 60 year old male. EXAM: CT CHEST, ABDOMEN AND PELVIS WITHOUT CONTRAST TECHNIQUE: Multidetector CT imaging of the chest, abdomen and pelvis was performed following the standard protocol without IV contrast. COMPARISON:  CT abdomen and pelvis of the same date. Also with CT of December 30th of 2021 FINDINGS: CT CHEST FINDINGS Cardiovascular: Calcified atheromatous plaque in the thoracic aorta. Heart size is normal following sternotomy for heart transplant by report a [facility]. Three-vessel coronary artery calcification. No pericardial effusion. Calcifications in the LEFT atrium partially seen on previous imaging are of uncertain significance central pulmonary vasculature also with some signs of calcification. Mediastinum/Nodes: Ovoid structure associated with LEFT thyroid 11 mm similar to previous imaging, cervical spine from 2017. Thyroid as described above. Calcifications in the LEFT supraclavicular region may be within venous structures on the LEFT. No axillary lymphadenopathy. Surgical clips in the RIGHT axilla and soft tissue thickening over the LEFT pectoralis musculature similar grossly compared to remote MRI evaluation 2018 and surgical clips seen on numerous priors in the RIGHT axilla. No mediastinal lymphadenopathy. No gross hilar lymphadenopathy. Lungs/Pleura: Small RIGHT of pleural effusion and basilar airspace disease. Small nodule in the superior aspect of the RIGHT middle lobe approximately 3 mm  on image 57 of series 5. Biapical scarring. Small nodule in the LEFT upper lobe 6 mm (image 51, series 5) airways are patent. Musculoskeletal: Evidence ir sternotomy without significant bony bridging but with sclerotic margins along the sternotomy line similar to studies dating back to August of 2021 with respect to visualized portions on prior abdomen studies no destructive bone finding or acute bone process. CT ABDOMEN PELVIS  FINDINGS Hepatobiliary: 1.9 cm lesion in the RIGHT hepatic lobe near the dome of the RIGHT hemi liver does not display attenuation values of simple cyst is unchanged from the study acquired on the same date at another facility. Additionally a 1.4 cm lesion in the RIGHT hepatic lobe on image 23 of series 2 is unchanged. Sludge in the gallbladder. Tiny low-density foci in the LEFT hepatic lobe without change 1 on image 19 of series 2 in the lateral segment measuring 5 mm and another in the medial segment of the LEFT hepatic lobe measuring 12 mm on image 21 of series 2 these areas appears similar dating back to August of 2020, lesions in the RIGHT hepatic lobe are new. Pancreas: Normal pancreatic contour without signs of inflammation. Spleen: Post splenectomy. Adrenals/Urinary Tract: Adrenal glands, normal on the LEFT and obscured on the RIGHT by perinephric and Peri adrenal nodularity. Fullness of the RIGHT renal pelvis with nephroureteral stent in place showing soft tissue density with distension of the renal pelvis and calices similar to the study acquired on the same date. Nephrolithiasis also unchanged. Perinephric nodularity along the medial upper pole the LEFT kidney measures 6.4 x 2.7 cm which is within 1-2 mm of its previous measurement when measured by this observer on the prior study. Increased in size when compared to the study of November 30, 2020. Renal cortical scarring on the LEFT with nephrolithiasis in the lower pole unchanged from very recent comparison. Urinary bladder with small amount of gas in the urinary bladder with distal aspect of the stent in place, loop coiled in the urinary bladder. Stomach/Bowel: Scattered colonic diverticulosis without acute gastrointestinal process. Normal appendix. Stool and contrast throughout the colon. Vascular/Lymphatic: Calcified atheromatous plaque in the abdominal aorta without aneurysmal dilation. Retroperitoneal adenopathy unchanged from scan earlier the same  day, largest lymph nodes behind the inferior vena cava and in the intra-aortocaval groove and in the upper abdomen, largest on image 41 of series 2 measuring 1.9 cm. Smaller lymph nodes along the LEFT periaortic chain. Postoperative changes in the retroperitoneum associated with previous lymphadenectomy No pelvic lymphadenopathy. Reproductive: Prostate with uro lift device deployment bilaterally similar to recent priors. Other: Extensive retroperitoneal fascial thickening/stranding extending into the root of the small bowel mesentery, nodular changes associated with this thickening on the RIGHT outlining the renal fascia this crosses the midline where it is without significant nodularity but with fascial thickening and stranding, also tracking into the pelvis. Small amount of fluid in the pelvis. Musculoskeletal: Spinal degenerative changes. No destructive bone process or acute bone finding. IMPRESSION: 1. Retroperitoneal adenopathy and perinephric nodularity with dilation of the RIGHT renal pelvis and collecting systems, filled with what is suspected to represent neoplasm. 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 Differential considerations given heart transplantation and presumed immunosuppression would include posttransplant lymphoproliferative disorder/lymphoma. 2. Dense material in collecting systems may represent a mixture of tumor and or blood and there is profound dilation of collecting systems of the RIGHT kidney. This is unchanged compared to the recent comparison  study. 3. Signs of hepatic involvement with new lesions in the RIGHT hepatic lobe. 4. Ascites with small amount of ascites with very subtle infiltration of fat in the omentum not mentioned above, for instance on image 50 of series 2 raising the question of peritoneal involvement as well. This area is in the range of 2-3 mm. 5. Small RIGHT pleural  effusion and basilar airspace disease. 6. Postoperative changes about the chest likely related to prior heart transplantation with surgical clips in the RIGHT axilla and thickening and calcification along the anterior pectoralis musculature not changed since 2018. 7. Calcification in the LEFT atrium also about the aortic root and pulmonary artery and to a lesser degree in the RIGHT atrium likely reflects changes of vascular anastomotic sites in the setting of heart transplantation. 8. 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 9. Post splenectomy. 10. Aortic atherosclerosis. 11. Small nodule in the LEFT upper lobe 6 mm airways are patent. These results will be called to the ordering clinician or representative by the Radiologist Assistant, and communication documented in the PACS or [employer]. Aortic Atherosclerosis (ICD10-I70.0). Electronically Signed   By: Zetta Bills M.D.   On: 12/22/2020 17:38   DG Abd 1 View  Result Date: 12/20/2020 CLINICAL DATA:  Ureteral stent placement EXAM: ABDOMEN - 1 VIEW COMPARISON:  CT 12/30/2020 FINDINGS: Double-J LEFT ureteral stent in place. No renal or ureteral calculi identified. Normal bowel-gas pattern. IMPRESSION: RIGHT ureteral stent in place Electronically Signed   By: Suzy Bouchard M.D.   On: 12/20/2020 15:30   CT CHEST WO CONTRAST  Result Date: 12/13/2020 CLINICAL DATA:  Cancer of unknown primary in this 60 year old male. EXAM: CT CHEST, ABDOMEN AND PELVIS WITHOUT CONTRAST TECHNIQUE: Multidetector CT imaging of the chest, abdomen and pelvis was performed following the standard protocol without IV contrast. COMPARISON:  CT abdomen and pelvis of the same date. Also with CT of December 30th of 2021 FINDINGS: CT CHEST FINDINGS Cardiovascular: Calcified atheromatous plaque in the thoracic aorta. Heart size is normal following sternotomy for heart transplant by report a [name]. Three-vessel coronary artery calcification. No pericardial effusion. Calcifications in the LEFT atrium partially seen on previous imaging are of uncertain significance central pulmonary vasculature also with some signs of calcification. Mediastinum/Nodes: Ovoid structure associated with LEFT thyroid 11 mm similar to previous imaging, cervical spine from 2017. Thyroid as described above. Calcifications in the LEFT supraclavicular region may be within venous structures on the LEFT. No axillary lymphadenopathy. Surgical clips in the RIGHT axilla and soft tissue thickening over the LEFT pectoralis musculature similar grossly compared to remote MRI evaluation 2018 and surgical clips seen on numerous priors in the RIGHT axilla. No mediastinal lymphadenopathy. No gross hilar lymphadenopathy. Lungs/Pleura: Small RIGHT of pleural effusion and basilar airspace disease. Small nodule in the superior aspect of the RIGHT middle lobe approximately 3 mm on image 57 of series 5. Biapical scarring. Small nodule in the LEFT upper lobe 6 mm (image 51, series 5) airways are patent. Musculoskeletal: Evidence ir sternotomy without significant bony bridging but with sclerotic margins along the sternotomy line similar to studies dating back to August of 2021 with respect to visualized portions on prior abdomen studies no destructive bone finding or acute bone process. CT ABDOMEN PELVIS FINDINGS Hepatobiliary: 1.9 cm lesion in the RIGHT hepatic lobe near the dome of the RIGHT hemi liver does not display attenuation values of simple cyst is  unchanged from the study acquired on the same date at another facility. Additionally a 1.4 cm lesion in the RIGHT hepatic lobe on image 23 of series 2 is unchanged. Sludge in the gallbladder. Tiny low-density foci in the LEFT hepatic lobe without change 1 on image 19 of series 2 in the lateral segment measuring 5 mm and another in the medial segment of the LEFT hepatic lobe  measuring 12 mm on image 21 of series 2 these areas appears similar dating back to August of 2020, lesions in the RIGHT hepatic lobe are new. Pancreas: Normal pancreatic contour without signs of inflammation. Spleen: Post splenectomy. Adrenals/Urinary Tract: Adrenal glands, normal on the LEFT and obscured on the RIGHT by perinephric and Peri adrenal nodularity. Fullness of the RIGHT renal pelvis with nephroureteral stent in place showing soft tissue density with distension of the renal pelvis and calices similar to the study acquired on the same date. Nephrolithiasis also unchanged. Perinephric nodularity along the medial upper pole the LEFT kidney measures 6.4 x 2.7 cm which is within 1-2 mm of its previous measurement when measured by this observer on the prior study. Increased in size when compared to the study of November 30, 2020. Renal cortical scarring on the LEFT with nephrolithiasis in the lower pole unchanged from very recent comparison. Urinary bladder with small amount of gas in the urinary bladder with distal aspect of the stent in place, loop coiled in the urinary bladder. Stomach/Bowel: Scattered colonic diverticulosis without acute gastrointestinal process. Normal appendix. Stool and contrast throughout the colon. Vascular/Lymphatic: Calcified atheromatous plaque in the abdominal aorta without aneurysmal dilation. Retroperitoneal adenopathy unchanged from scan earlier the same day, largest lymph nodes behind the inferior vena cava and in the intra-aortocaval groove and in the upper abdomen, largest on image 41 of series 2 measuring 1.9 cm. Smaller lymph nodes along the LEFT periaortic chain. Postoperative changes in the retroperitoneum associated with previous lymphadenectomy No pelvic lymphadenopathy. Reproductive: Prostate with uro lift device deployment bilaterally similar to recent priors. Other: Extensive retroperitoneal fascial thickening/stranding extending into the root of the small bowel  mesentery, nodular changes associated with this thickening on the RIGHT outlining the renal fascia this crosses the midline where it is without significant nodularity but with fascial thickening and stranding, also tracking into the pelvis. Small amount of fluid in the pelvis. Musculoskeletal: Spinal degenerative changes. No destructive bone process or acute bone finding. IMPRESSION: 1. Retroperitoneal adenopathy and perinephric nodularity with dilation of the RIGHT renal pelvis and collecting systems, filled with what is suspected to represent neoplasm. 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 Differential considerations given heart transplantation and presumed immunosuppression would include posttransplant lymphoproliferative disorder/lymphoma. 2. Dense material in collecting systems may represent a mixture of tumor and or blood and there is profound dilation of collecting systems of the RIGHT kidney. This is unchanged compared to the recent comparison study. 3. Signs of hepatic involvement with new lesions in the RIGHT hepatic lobe. 4. Ascites with small amount of ascites with very subtle infiltration of fat in the omentum not mentioned above, for instance on image 50 of series 2 raising the question of peritoneal involvement as well. This area is in the range of 2-3 mm. 5. Small RIGHT pleural effusion and basilar airspace disease. 6. Postoperative changes about the chest likely related to prior heart transplantation with surgical clips in the RIGHT axilla and thickening and calcification along the anterior pectoralis musculature not  changed since 2018. 7. Calcification in the LEFT atrium also about the aortic root and pulmonary artery and to a lesser degree in the RIGHT atrium likely reflects changes of vascular anastomotic sites in the setting of heart transplantation. 8. 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 9. Post splenectomy. 10. Aortic atherosclerosis. 11. Small nodule in the LEFT upper lobe 6 mm airways are patent. These results will be called to the ordering clinician or representative by the Radiologist Assistant, and communication documented in the PACS or [employer]. Aortic Atherosclerosis (ICD10-I70.0). Electronically Signed   By: Zetta Bills M.D.   On: 12/26/2020 17:38   US RENAL  Result Date: 12/20/2020 CLINICAL DATA:  Urinary retention. 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 EXAM: RENAL / URINARY TRACT ULTRASOUND COMPLETE COMPARISON:  CT 12/27/2020 FINDINGS: Right Kidney: Renal measurements: 14.4 x 9.2 x 7.9 cm = volume: 547 mL. Normal echogenicity. Mild right hydronephrosis, relatively similar to 12/27/2020 CT. Left Kidney: Renal measurements: 12.5 x 3.5 x 6.4 cm = volume: 231 mL. Echogenicity within normal limits. No mass or hydronephrosis visualized. Bladder: Echogenicity within the posterior bladder is likely due to the right ureteric stent. Other: Mild exam degradation secondary to patient's tenderness. IMPRESSION: 1. Mild right-sided hydronephrosis, relatively similar to 12/20/2020 CT. 2. No other explanation for urinary retention. Electronically Signed   By: Abigail Miyamoto M.D.   On: 12/20/2020 11:18   IR Fluoro Guide CV Line Left  Result Date: 12/17/2020 INDICATION: 60 year old male referred for hemodialysis catheter placement EXAM: TUNNELED CENTRAL VENOUS HEMODIALYSIS CATHETER PLACEMENT WITH ULTRASOUND AND FLUOROSCOPIC GUIDANCE MEDICATIONS: 1 g vancomycin. The antibiotic was given in an appropriate time interval prior to skin puncture. ANESTHESIA/SEDATION: No moderate sedation. A total of Versed 0.5 mg and Fentanyl 0 mcg was administered intravenously. Moderate Sedation Time: 0 minutes. The patient's level of consciousness and vital signs were monitored continuously by radiology nursing throughout  the procedure under my direct supervision. FLUOROSCOPY TIME:  Fluoroscopy Time: 0 minutes 54 seconds (3 mGy). COMPLICATIONS: None PROCEDURE: Informed written consent was obtained from the patient after a discussion of the risks, benefits, and alternatives to treatment. Questions regarding the procedure were encouraged and answered. The right neck and chest were prepped with chlorhexidine in a sterile fashion, and a sterile drape was applied covering the operative field. Maximum barrier sterile technique with sterile gowns and gloves were used for the procedure. A timeout was performed prior to the initiation of the procedure. Ultrasound survey was performed. Micropuncture kit was utilized to access the right internal jugular vein under direct, real-time ultrasound guidance after the overlying soft tissues were anesthetized with 1% lidocaine with epinephrine. Stab incision was made with 11 blade scalpel. Microwire was passed centrally. The microwire was then marked to measure appropriate internal catheter length. External tunneled length was estimated. A total tip to cuff length of 19 cm was selected. 035 guidewire was advanced to the level of the IVC. Skin and subcutaneous tissues of chest wall below the clavicle were generously infiltrated with 1% lidocaine for local anesthesia. A small stab incision was made with 11 blade scalpel. The selected hemodialysis catheter was tunneled in a retrograde fashion from the anterior chest wall to the venotomy incision. Serial dilation was performed and then a peel-away sheath was placed. The catheter was then placed through the peel-away sheath with tips ultimately positioned within the superior aspect of the right atrium. Final catheter positioning was confirmed and documented with  a spot radiographic image. The catheter aspirates and flushes normally. The catheter was flushed with appropriate volume heparin dwells. The catheter exit site was secured with a 0-Prolene retention  suture. Gel-Foam slurry was infused into the soft tissue tract. The venotomy incision was closed Derma bond and sterile dressing. Dressings were applied at the chest wall. Patient tolerated the procedure well and remained hemodynamically stable throughout. No complications were encountered and no significant blood loss encountered. IMPRESSION: Status post right IJ tunneled hemodialysis catheter placement. Catheter ready for use. Signed, Dulcy Fanny. Dellia Nims, RPVI [facility] Radiology Electronically Signed   By: Corrie Mckusick D.O.   On: 12/17/2020 15:26   IR US Guide Vasc Access Left  Result Date: 12/17/2020 INDICATION: 61 year old male referred for hemodialysis catheter placement EXAM: TUNNELED CENTRAL VENOUS HEMODIALYSIS CATHETER PLACEMENT WITH ULTRASOUND AND FLUOROSCOPIC GUIDANCE MEDICATIONS: 1 g vancomycin. The antibiotic was given in an appropriate time interval prior to skin puncture. ANESTHESIA/SEDATION: No moderate sedation. A total of Versed 0.5 mg and Fentanyl 0 mcg was administered intravenously. Moderate Sedation Time: 0 minutes. The patient's level of consciousness and vital signs were monitored continuously by radiology nursing throughout the procedure under my direct supervision. FLUOROSCOPY TIME:  Fluoroscopy Time: 0 minutes 54 seconds (3 mGy). COMPLICATIONS: None PROCEDURE: Informed written consent was obtained from the patient after a discussion of the risks, benefits, and alternatives to treatment. Questions regarding the procedure were encouraged and answered. The right neck and chest were prepped with chlorhexidine in a sterile fashion, and a sterile drape was applied covering the operative field. Maximum barrier sterile technique with sterile gowns and gloves were used for the procedure. A timeout was performed prior to the initiation of the procedure. Ultrasound survey was performed. Micropuncture kit was utilized to access the right internal  jugular vein under direct, real-time ultrasound guidance after the overlying soft tissues were anesthetized with 1% lidocaine with epinephrine. Stab incision was made with 11 blade scalpel. Microwire was passed centrally. The microwire was then marked to measure appropriate internal catheter length. External tunneled length was estimated. A total tip to cuff length of 19 cm was selected. 035 guidewire was advanced to the level of the IVC. Skin and subcutaneous tissues of chest wall below the clavicle were generously infiltrated with 1% lidocaine for local anesthesia. A small stab incision was made with 11 blade scalpel. The selected hemodialysis catheter was tunneled in a retrograde fashion from the anterior chest wall to the venotomy incision. Serial dilation was performed and then a peel-away sheath was placed. The catheter was then placed through the peel-away sheath with tips ultimately positioned within the superior aspect of the right atrium. Final catheter positioning was confirmed and documented with a spot radiographic image. The catheter aspirates and flushes normally. The catheter was flushed with appropriate volume heparin dwells. The catheter exit site was secured with a 0-Prolene retention suture. Gel-Foam slurry was infused into the soft tissue tract. The venotomy incision was closed Derma bond and sterile dressing. Dressings were applied at the chest wall. Patient tolerated the procedure well and remained hemodynamically stable throughout. No complications were encountered and no significant blood loss encountered. IMPRESSION: Status post right IJ tunneled hemodialysis catheter placement. Catheter ready for use. Signed, Dulcy Fanny. Dellia Nims, RPVI [facility] Radiology Electronically Signed   By: Corrie Mckusick D.O.   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 ANESTHESIA/SEDATION: Fentanyl 100 mcg IV; Versed 4 mg IV Sedation time: 14 minutes; The patient was continuously monitored during the procedure by the interventional radiology nurse under my direct supervision. CONTRAST:  None. COMPLICATIONS: SIR Level A - No therapy, no consequence. Retroperitoneal hemorrhage at site of biopsy, controlled with Gel-Foam slurry administration along needle track. PROCEDURE: Informed consent was obtained from the patient following an explanation of the procedure, risks, benefits and alternatives. A time out was performed prior to the initiation of the procedure. The patient was positioned in a partial left lateral decubitus position on the CT table and a limited CT was performed for procedural planning demonstrating similar appearing multifocal right retroperitoneal lymphadenopathy. The procedure was planned. The operative site was prepped and draped in the usual sterile fashion. Appropriate trajectory was confirmed with a 22 gauge spinal needle after the adjacent tissues were anesthetized with 1% Lidocaine with epinephrine. Under intermittent CT guidance, a 17 gauge coaxial needle was advanced into the peripheral aspect of the mass. Appropriate positioning was confirmed and a total of 4 samples were obtained with an 18 gauge core needle biopsy device. A limited CT demonstrated focal hemorrhage about the biopsied lymph node. Gel-Foam slurry was injected through the introducer needle along the needle track. The co-axial needle was removed and hemostasis was achieved with manual compression. Additional limited postprocedural CT was negative for expanding hemorrhage or additional complication. A dressing was placed. The patient tolerated the procedure well without immediate postprocedural complication. IMPRESSION: Technically successful CT guided core needle  biopsy of right retroperitoneal lymph node. Ruthann Cancer, MD [facility] Electronically Signed   By: Ruthann Cancer MD   On: 12/07/2020 08:53   DG CHEST PORT 1 VIEW  Result Date: 12/10/2020 CLINICAL DATA:  Hypoxia.  History of cardiac transplant EXAM: PORTABLE CHEST 1 VIEW COMPARISON:  12/07/2020 FINDINGS: Prior median sternotomy. Heart size within normal limits. Atherosclerotic calcification of the aortic knob. Subtle right lower lobe opacity, slightly improved from prior. Trace right pleural effusion. No pneumothorax. IMPRESSION: 1. Subtle right lower lobe opacity, slightly improved from prior. 2. Trace right pleural effusion. Electronically Signed   By: Davina Poke D.O.   On: 12/10/2020 10:33   VAS [place] LOWER EXTREMITY VENOUS (DVT)  Result Date: 12/10/2020  Lower Venous DVT Study Indications: Pain.  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 Comparison Study: Previous 12/02/20 Negative Performing Technologist: Vonzell Schlatter RVT  Examination Guidelines: A complete evaluation includes B-mode imaging, spectral Doppler, color Doppler, and power Doppler as needed of all accessible portions of each vessel. Bilateral testing is considered an integral part of a complete examination. Limited examinations for reoccurring indications may be performed as noted. The reflux portion of the exam is performed with the patient in reverse Trendelenburg.  +---------+---------------+---------+-----------+----------+--------------+ RIGHT    CompressibilityPhasicitySpontaneityPropertiesThrombus Aging +---------+---------------+---------+-----------+----------+--------------+ CFV      Full           Yes      Yes                                 +---------+---------------+---------+-----------+----------+--------------+ SFJ      Full                                                         +---------+---------------+---------+-----------+----------+--------------+  FV Prox  Full                                                        +---------+---------------+---------+-----------+----------+--------------+ FV Mid   Full                                                        +---------+---------------+---------+-----------+----------+--------------+ FV DistalFull                                                        +---------+---------------+---------+-----------+----------+--------------+ PFV      Full                                                        +---------+---------------+---------+-----------+----------+--------------+ POP      Full           Yes      Yes                                 +---------+---------------+---------+-----------+----------+--------------+ PTV      None                                                        +---------+---------------+---------+-----------+----------+--------------+ PERO     None                                                        +---------+---------------+---------+-----------+----------+--------------+   +---------+---------------+---------+-----------+----------+--------------+ LEFT     CompressibilityPhasicitySpontaneityPropertiesThrombus Aging +---------+---------------+---------+-----------+----------+--------------+ CFV      Full           Yes      Yes                                 +---------+---------------+---------+-----------+----------+--------------+ SFJ      Full                                                        +---------+---------------+---------+-----------+----------+--------------+ FV Prox  Full                                                        +---------+---------------+---------+-----------+----------+--------------+  FV Mid   Full                                                         +---------+---------------+---------+-----------+----------+--------------+ FV DistalFull                                                        +---------+---------------+---------+-----------+----------+--------------+ PFV      Full                                                        +---------+---------------+---------+-----------+----------+--------------+ POP      Full           Yes      Yes                                 +---------+---------------+---------+-----------+----------+--------------+ PTV      Full                                                        +---------+---------------+---------+-----------+----------+--------------+ PERO     Full                                                        +---------+---------------+---------+-----------+----------+--------------+     Summary: RIGHT: - Findings consistent with acute deep vein thrombosis involving one right posterior tibial vein, and both right peroneal veins. - No cystic structure found in the popliteal fossa.  LEFT: - There is no evidence of deep vein thrombosis in the lower extremity.  - No cystic structure found in the popliteal fossa.  *See table(s) above for measurements and observations. Electronically signed by Jamelle Haring on 12/10/2020 at 7:16:00 PM.    Final    ECHOCARDIOGRAM LIMITED  Result Date: 12/10/2020    ECHOCARDIOGRAM LIMITED REPORT   Patient Name:   Glenn Gonzalez Date of Exam: 12/10/2020 Medical Rec #:  [id number]         Height:       74.0 in Accession #:    [id number]        Weight:       230.4 lb Date of Birth:  [birth year]        BSA:          2.308 m Patient Age:    28 years          BP:           105/72 mmHg Patient Gender: M                 HR:  99 bpm. Exam Location:  Inpatient Procedure: Limited Echo, Cardiac Doppler and Color Doppler Indications:    Acute DVT  History:        Patient has prior history of Echocardiogram examinations, most                 recent  11/30/2020. Arrythmias:Atrial Fibrillation. CKD. GERD.                 S/P heart transplant 01/22/16.  Sonographer:    Clayton Lefort RDCS (AE) Referring Phys: [id number] [facility] D NETTEY  Sonographer Comments: Technically difficult study due to poor echo windows, suboptimal apical window and suboptimal subcostal window. Unable to roll patient due to herniated discs. IMPRESSIONS  1. S/P Heart Transplant. Left ventricular ejection fraction, by estimation, is 60 to 65%. The left ventricle has normal function. Left ventricular endocardial border not optimally defined to evaluate regional wall motion. There is moderate concentric left ventricular hypertrophy. Left ventricular diastolic function could not be evaluated.  2. Right ventricular systolic function is moderately reduced. The right ventricular size is mildly enlarged. Tricuspid regurgitation signal is inadequate for assessing PA pressure.  3. A small pericardial effusion is present. The pericardial effusion is surrounding the apex.  4. Tricuspid valve regurgitation is mild to moderate. FINDINGS  Left Ventricle: S/P Heart Transplant. Left ventricular ejection fraction, by estimation, is 60 to 65%. The left ventricle has normal function. Left ventricular endocardial border not optimally defined to evaluate regional wall motion. There is moderate concentric left ventricular hypertrophy. Left ventricular diastolic function could not be evaluated. Right Ventricle: The right ventricular size is mildly enlarged. Right ventricular systolic function is moderately reduced. Tricuspid regurgitation signal is inadequate for assessing PA pressure. Pericardium: A small pericardial effusion is present. The pericardial effusion is surrounding the apex. Tricuspid Valve: Tricuspid valve regurgitation is mild to moderate. Venous: The inferior vena cava was not well visualized. LEFT VENTRICLE PLAX 2D LVIDd:         3.60 cm LVIDs:         2.30 cm LV PW:         1.40 cm LV IVS:        1.50 cm  LVOT diam:     2.50 cm LVOT Area:     4.91 cm  LEFT ATRIUM         Index LA diam:    2.80 cm 1.21 cm/m   AORTA Ao Root diam: 3.70 cm Ao Asc diam:  3.10 cm TRICUSPID VALVE TR Peak grad:   52.4 mmHg TR Vmax:        362.00 cm/s  SHUNTS Systemic Diam: 2.50 cm Fransico Him MD Electronically signed by Fransico Him MD Signature Date/Time: 12/10/2020/3:35:06 PM    Final     ASSESSMENT AND PLAN: 1) Retroperitoneal adenopathy and hepatic lesions  consistent with poorly differentiated carcinoma-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  Molecular studies pending. -12/31/2020 CT chest/abdomen/pelvis without contrast showed "1. Retroperitoneal adenopathy and perinephric nodularity with dilation of the RIGHT renal pelvis and collecting systems, filled with what is suspected to represent neoplasm. 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 Differential considerations given heart transplantation and presumed immunosuppression would include posttransplant lymphoproliferative disorder/lymphoma. 2. Dense material in collecting systems may represent a mixture of tumor and or blood and there is profound dilation of collecting systems of the RIGHT kidney. This is unchanged compared to the recent comparison study. 3. Signs of hepatic involvement with  new lesions in the RIGHT hepatic lobe. 4. Ascites with small amount of ascites with very subtle infiltration of fat in the omentum not mentioned above, for instance on image 50 of series 2 raising the question of peritoneal involvement as well. This area is in the range of 2-3 mm. 5. Small RIGHT pleural effusion and basilar airspace disease. 6. Postoperative changes about the chest likely related to prior heart transplantation with surgical clips in the RIGHT axilla and thickening and calcification along the anterior pectoralis musculature not changed since 2018. 7. Calcification in  the LEFT atrium also about the aortic root and pulmonary artery and to a lesser degree in the RIGHT atrium likely reflects changes of vascular anastomotic sites in the setting of heart transplantation. 8. 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 9. Post splenectomy. 10. Aortic atherosclerosis. 11. Small nodule in the LEFT upper lobe 6 mm airways are patent."  2) Back pain secondary to #1  3) Hyponatremia  4) Leukocytosis and Thrombocytosis  5) CKD  6) High-grade T1 bladder cancer  7) History of Hodgkin's lymphoma  8) status post heart transplant  PLAN: -The patient is currently day 19 cycle 1 of Padcev.  He tolerated his chemotherapy well overall. -PD-L1 testing negative. -He is aware that this is an incurable condition. -The patient has opted not to pursue any additional hemodialysis.  He has now transitioned to comfort measures. -Family has made it very clear that they would like to make sure he is comfortable.  I have reviewed his medications and adjusted his Dilaudid to be given 1 mg every 2 hours as needed for pain or agitation.  May need to consider switching to a Dilaudid drip has increased pain or agitation.   LOS: 25 days   Mikey Bussing, DNP, AGPCNP-BC, AOCNP 2021-01-11

## 2021-01-29 NOTE — Progress Notes (Signed)
Called [place] [facility] referral Number [phone]. Spoke to [employer] patient is only eyes donor only.

## 2021-01-29 NOTE — Progress Notes (Signed)
This chaplain responded to MD consult for notarizing the Pt. AD.  The Pt. wife-Lisa, brother-Eric, and mother-Freddie are bedside.    Lattie Haw updated the chaplain on the Pt.change to DNR and full comfort care.  The chaplain understands Lattie Haw will not pursue notarizing HCPOA because she is the Pt. surrogate decision maker following [place] [employer].  The chaplain offered F/U spiritual care as needed. The chaplain understands family will be present with the Pt. tonight.    This chaplain will F/U with Lattie Haw on Wednesday.

## 2021-01-29 NOTE — Discharge Summary (Addendum)
Death Summary  Glenn Gonzalez NGE:[id number] DOB: [birth year] DOA: 12/27/2020  PCP: Lajean Manes, MD PCP/Office notified:   Admit date: 2020/12/27 Date of Death: 23-Jan-2021  Final Diagnoses:  Principal Problem:   Metastatic urothelial carcinoma ([place]) Active Problems:   SYSTOLIC HEART FAILURE, CHRONIC   Acute kidney injury superimposed on CKD ([place])   Hypothyroidism   Hyponatremia   Back pain   Retroperitoneal lymphadenopathy   Liver lesion   Acute on chronic combined systolic and diastolic CHF (congestive heart failure) ([place])   Acute DVT of right tibial vein ([facility])   Retroperitoneal hemorrhage   Neoplasm related pain   Encounter for antineoplastic chemotherapy   Abdominal distension   Pressure injury of skin    1. High-grade bladder cancer 2. AKI on CKD stage III now dialysis dependent 3. chronic combined systolic and diastolic heart failure in a patient with heart transplant 4. Hypotension  History of present illness:  HPI: Glenn Gonzalez is a 60 y.o. male with medical history significant of hydronephrosis, hodgkin's lymphoma, bladder cancer. Presenting with back pain. Recently admitted for hydronephrosis and back pain. At the time, he had a stent placed and imaging found new retroperitoneal lymphadenopathy. He was told to follow up with oncology for repeat scans in a few weeks with possible IR intervention coming after. He reported to the Urology office today with increased back pain. It was sharp and on the right side. It was unrelieved by tramadol or hydrocodone. He was seen in clinic and noted to have his stents in good position. It was recommended that he come to the hospital for further eval.    Hospital Course:  Glenn Gonzalez a 60 y.o.year old malewith medical history significant for Hodgkin lymphoma, cardiac transplant on tacrolimus and prednisone, chronic combined systolic/diastolic CHF, GERD, high-grade T1 bladder cancer with recent hospitalization from  40/10-2/7 for AKI complicated by hydronephrosis in the setting of new retroperitoneal lymphadenopathy. Patient underwent stent placement by urology and was treated for postoperative multifocal pneumonia.  He presented from urology office with reports of increased back pain not well controlled on oral pain medication as well as generalized fatigue, poor appetite. He was brought in under observation due to concern for back pain in the setting of known retroperitoneal lymphadenopathy.  Hospital course complicated by acute hyponatremia in the setting of poor appetite requiring IV fluids and persistent leukocytosis. CT abdomen imaging consistent with retroperitoneal adenopathy and dilation of right renal pelvis and collecting systems concerning for urothelial neoplasm with extensive metastatic process. Found to have acute right lower extremity DVT on venous duplex started on heparin on 1/10.Has opted for dialysis while awaiting [facility] response.  1/17: Tunneled dialysis cath placed by IR, initiated hemodialysis, first 1/17, tolerated 1/18: second hemodialysis planned 1/21: Chemotherapy, second planned today 1/21, undergoing HD 1/28: Patient received day 15 of cycle 1 of chemotherapy.  Patient initially desired to be DNR.  He subsequently expresses desire to switch himself to full code after few days.  After continuing aggressive measures for several days including chemotherapy, medications to keep his blood pressure up and dialysis, patient gave up and he once again wanted to be DNR and subsequently chose to forego any aggressive measures including dialysis and wanted only comfort care.  This was in agreement with his family.  Patient was transition to comfort care on the afternoon of January 23, 2021 and then he passed away at 1756 PM.  History of cardiac transplant Patient was continued on prednisone and tacrolimus and he was also seen  by cardiology here.  Acute urinary retention with gross hematuria:  Patient had acute urinary retention with some hematuria on 12/26/2020 unsure whether hematuria was secondary to traumatic due to In-N-Out catheter.  Patient did not consent to the indwelling Foley catheter as was recommended by urology.  His hematuria and acute urinary retention resolved on itself.  Retroperitoneal adenopathy and perinephric nodularity, high-grade bladder CA, recently diagnosed -Status post IR guided retroperitoneal biopsy, pathology positive for poorly differentiated carcinoma. Recent hospitalization 14/97-0/2 for AKI complicated by hydronephrosis in the setting of new retroperitoneal LAD, underwent stent placement. - Urology and oncology Consulted but no recent notes. --Startedon palliative chemo 1/14, palliative medicine following  - C1D8 Padcev chemo On 1/21 and 1/28  AKI on CKD stage 3a,requiring dialysis, new problem -Baseline creatinine 1.2-1.4 from 08/2020, 1.8 from last hospitalization -Suspect lymphatic obstruction due to malignancy, low albumin, renal function worsening over the last few weeks. Nephrology was consulted. -Tunneled HD line was placed on the1/17 and he has been started on dialysis.Bladder was noted to be distended on 1/20 he was seen by urology who recommended placement of a Foley if he was unable to void.  He is getting intermittent HD.  Nephrology also gave a trial of high-dose Lasix of 120 mg IV twice daily yesterday with no good response less poor urine output so diuretics were discontinued.  He was continued on IHD.  Acute right lower extremity DVT He was started on heparin IV.  Elevated ALT-monitor as immunosuppressive medications cleared hepatically   Small retroperitoneal hemorrhage status post biopsy -H&H stable  Small pericardial effusion -Incidentally noted on echocardiogram, without tamponade physiology  Chronic combined systolic and diastolic heart failure, history of cardiac transplant -Patient is status post heart  transplant, prior EF 10% - EF has now corrected to 60 to 65% -Volume control with HD, initiated on 1/17 -Dr. Rae Lips contacted on admission as he has been in contact with transplant team as below.   Time: 15 minutes  Signed:  Darliss Cheney  [facility] 01/02/2021, 1:06 PM

## 2021-01-29 NOTE — Progress Notes (Signed)
[place] for heparin / Warfarin Indication: DVT  Labs: Recent Labs    12/30/20 0436 12/31/20 0116 January 20, 2021 0347  HGB 9.6* 8.9*  --   HCT 29.9* 27.2*  --   PLT 231 207  --   APTT  --  111*  --   LABPROT  --  14.1 17.4*  INR  --  1.1 1.5*  HEPARINUNFRC 0.63 0.55 0.46  CREATININE 4.11*  --   --     Assessment: 60 yo male on heparin drip for RLE DVT.  Heparin level therapeutic. Warfarin started 1/30 - INR trending towards goal  Hgb now trending down-8.9,  pltc wnl stable.     Goal of Therapy:  Heparin level 0.3-0.7 units/ml  Monitor platelets by anticoagulation protocol: yes INR 2 to 3    Plan:  Continue heparin infusion at 1350 units/hr Continue Warfarin 5 mg po daily  Daily heparin level, INR, CBC  Thank you Anette Guarneri, PharmD 01-20-2021, 8:37 AM

## 2021-01-29 NOTE — Progress Notes (Addendum)
PROGRESS NOTE    Glenn Gonzalez  OEU:[id number]  DOB: 23-Feb-1961  DOA: 12/14/2020 PCP: Glenn Manes, MD Outpatient Specialists:   Hospital course:  Glenn Weygandt Duehringis a 60 y.o.year old malewith medical history significant for Hodgkin lymphoma, cardiac transplant on tacrolimus and prednisone, chronic combined systolic/diastolic CHF, GERD, high-grade T1 bladder cancer with recent hospitalization from 15/40-0/8 for AKI complicated by hydronephrosis in the setting of new retroperitoneal lymphadenopathy. Patient underwent stent placement by urology and was treated for postoperative multifocal pneumonia.  He presented from urology office with reports of increased back pain not well controlled on oral pain medication as well as generalized fatigue, poor appetite. He was brought in under observation due to concern for back pain in the setting of known retroperitoneal lymphadenopathy.  Hospital course complicated by acute hyponatremia in the setting of poor appetite requiring IV fluids and persistent leukocytosis. CT abdomen imaging consistent with retroperitoneal adenopathy and dilation of right renal pelvis and collecting systems concerning for urothelial neoplasm with extensive metastatic process. Found to have acute right lower extremity DVT on venous duplex started on heparin on 1/10.Has opted for dialysis while awaiting [facility] response.  1/17: Tunneled dialysis cath placed by IR, initiated hemodialysis, first 1/17, tolerated 1/18: second hemodialysis planned 1/21: Chemotherapy, second planned today 1/21, undergoing HD 1/28: Patient received day 15 of cycle 1 of chemotherapy.   Subjective: Patient seen and examined.  Events from last night noted when patient was confused and had called 911 multiple times and please also came to the hospital.  He was evaluated by night hospitalist.  This morning, he was alert and oriented.  When I asked him if he had remembered the events last  night, he said yes but he did not want to talk about that.  It appeared that he was feeling embarrassed about the events.  Objective: Vitals:   01/03/2021 0440 01/03/21 0500 01/03/21 1058 2021/01/03 1110  BP: 106/62  (!) 76/57 (!) 78/57  Pulse: (!) 110  (!) 115 (!) 113  Resp: 20   18  Temp: 98 F (36.7 C)   98.2 F (36.8 C)  TempSrc:    Oral  SpO2: 91%   94%  Weight:  98.4 kg    Height:        Intake/Output Summary (Last 24 hours) at 01/03/21 1137 Last data filed at 01/03/21 0630 Gross per 24 hour  Intake 749.68 ml  Output --  Net 749.68 ml   Filed Weights   12/28/20 2137 12/29/20 0500 01-03-2021 0500  Weight: 104.8 kg 104.8 kg 98.4 kg     Exam: General exam: Appears calm and comfortable  Respiratory system: Clear to auscultation. Respiratory effort normal. Cardiovascular system: S1 & S2 heard, RRR. No JVD, murmurs, rubs, gallops or clicks. No pedal edema. Gastrointestinal system: Abdomen is nondistended, soft and nontender. No organomegaly or masses felt. Normal bowel sounds heard. Central nervous system: Alert and oriented. No focal neurological deficits. Extremities: Symmetric 5 x 5 power. Skin: No rashes, lesions or ulcers.  Psychiatry: Judgement and insight appear poor  Assessment & Plan:   Unfortunate 60 year old man status post cardiac transplant now with metastatic high-grade bladder CA  Constipation Resolved.   Persistent hypotension: Persistent hypotension despite of starting on midodrine 5 mg 3 times daily yesterday.  We will continue this for another day.  If remains an issue, will increase to 10 mg 3 times daily.  Anasarca and electrolyte abnormalities Per hemodialysis and nephrology service  History of cardiac transplant Continue tacrolimus,  to keep goal levels between 5-8 Steroid taper has been completed and is presently on prednisone 5 mg daily. Patient is followed by the [facility] transplant team Dr. Kae Gonzalez has been consulted and is providing  consultation.  Acute urinary retention with gross hematuria: Patient had acute urinary retention with some hematuria on 12/26/2020 unsure whether hematuria was secondary to traumatic due to In-N-Out catheter.  Patient did not consent to the indwelling Foley catheter as was recommended by urology.  No further episodes of retention or hematuria.  Copied from previous notes: Retroperitoneal adenopathy and perinephric nodularity, high-grade bladder CA, recently diagnosed -Status post IR guided retroperitoneal biopsy, pathology positive for poorly differentiated carcinoma.  Recent hospitalization 36/14-4/3 for AKI complicated by hydronephrosis in the setting of new retroperitoneal LAD, underwent stent placement. - Urology and oncology Consulted but no recent notes. --Startedon palliative chemo 1/14, palliative medicine following  -  C1D8 Padcev chemo  On 1/21, next due 1/28   AKI on CKD stage 3a, requiring dialysis, new problem  -Baseline creatinine 1.2-1.4 from 08/2020, 1.8 from last hospitalization -Suspect lymphatic obstruction due to malignancy, low albumin, renal function worsening over the last few weeks.  Nephrology was consulted. -Tunneled HD line was placed on the 1/17 and he has been started on dialysis. Bladder was noted to be distended on 1/20 he was seen by urology who recommended placement of a Foley if he was unable to void.  He is getting intermittent HD.  Nephrology also gave a trial of high-dose Lasix of 120 mg IV twice daily yesterday with no good response less poor urine output so diuretics were discontinued today.  Per nephrology, he will require long-term IHD.  Acute right lower extremity DVT -Continue IV heparin, H&H stable Has been on bridging Lovenox and started on Coumadin yesterday.  Continue that.  Monitor closely as he had an episode of bleeding from his mouth.  Elevated ALT- monitor as immunosuppressive medications cleared hepatically   Small retroperitoneal  hemorrhage status post biopsy -H&H stable  Small pericardial effusion -Incidentally noted on echocardiogram, without tamponade physiology  Chronic combined systolic and diastolic heart failure, history of cardiac transplant -Patient is status post heart transplant, prior EF 10% - EF has now corrected to 60 to 65% -Volume control with HD, initiated on 1/17 -Dr. Haroldine Laws was contacted on admission as he has been in contact with transplant team as below.  History of cardiac transplant -Continue tacrolimus, to keep goal levels between 5-8 -Continue dexamethasone, reduced dose to 4 mg daily by Dr. Irene Limbo on 1/18 and okay to wean down to his baseline prednisone 5 mg daily--per note from Dr. Jeffie Pollock, to taper as quickly as possible which per Dr. Grier Mitts note okay to do so--he was tapered and has been on prednisone 5 mg p.o. daily since 12/26/2020. -Cardiology was consulted, per Dr Haroldine Laws, EF is normal, recommended to continue the immunosuppressive regimen and follow the tacrolimus levels. Dr. Haroldine Laws has notified/updated the transplant team at [facility]   Leukocytosis-clinically without infection and has been stable.  Could very well be due to steroids and malignancy itself.  Improving.  He is afebrile.  Hypothyroidism -Continue Synthroid  Goal of care: Patient apparently was DNR up until 12/25/20 when he talked to the chaplain and switched his CODE STATUS to full code.  Patient seems to have unrealistic expectations.  Consulted palliative care again yesterday.  Discussed with them personally as well.  According to them, in the past, multiple times patient has reiterated that he wants everything to be  done to prolong his life, no matter what the quality is.  Patient does not like to talk about prognosis at all.  Not sure what he has been told by nephrology and oncology but obviously he has poor prognosis.  Per palliative care, patient's expectations are high and unrealistic likely due to the fact  that he was offered hemodialysis as well as chemotherapy.  I have reached out to nephrology as well as oncology in a secure chat message and requested to talk to the patient and provide with updated prognosis which will help make him the right decision.  I also personally spoke to patient's wife Lattie Haw over the phone.  Lattie Haw on the other hand is very realistic and understands that DNR is the right thing to do and also understands that patient has poor prognosis.  She tells me that if [place] would be of right mind, he would never choose to prolong his suffering.  She wants to make decision for him but at this point in time, patient himself has the capacity to make decisions.  She was very thankful for the call.  She was reassured that medical team is available for support and any questions anytime.  Addendum 12 PM: I was informed by RN that patient and his wife has made a decision to switch him to DNR ASAP.  We will do that.  Poor appetite: Will start on Megace.  Code Status:  Full code DVT Prophylaxis: IV heparin Family Communication:  None at bedside.  Discussed with his wife over the phone as mentioned above. DVT prophylaxis: Heparin Code Status: DNR Family Communication: None Disposition Plan:   Patient is from: Home  Anticipated Discharge Location: TBD  Barriers to Discharge: Ongoing need for hemodialysis with anasarca and on palliative chemotherapy, needs SNF and likely hemodialysis assignment  Is patient medically stable for Discharge: No   Procedures:  HD cath 1.17 IR guided biopsy 1/06  Consultants:   Nephrology Oncology Palliative IR   Data Reviewed:  Basic Metabolic Panel: Recent Labs  Lab 12/26/20 0429 12/28/20 0417 12/29/20 0516 12/30/20 0436  NA 132* 133* 132* 135  K 5.3* 4.2 4.6 3.7  CL 96* 97* 97* 100  CO2 19* 20* 20* 22  GLUCOSE 94 95 118* 118*  BUN 87* 61* 81* 60*  CREATININE 3.83* 4.17* 5.17* 4.11*  CALCIUM 8.9 8.3* 8.4* 8.2*   Liver Function  Tests: Recent Labs  Lab 12/26/20 0429 12/28/20 0417 12/30/20 0436  AST 60* 51* 76*  ALT 19 22 34  ALKPHOS 58 53 183*  BILITOT 0.9 1.2 1.5*  PROT 5.7* 5.1* 4.8*  ALBUMIN 3.1* 2.7* 2.7*   No results for input(s): LIPASE, AMYLASE in the last 168 hours. No results for input(s): AMMONIA in the last 168 hours. CBC: Recent Labs  Lab 12/27/20 0438 12/28/20 0417 12/29/20 0516 12/30/20 0436 12/31/20 0116  WBC 15.4* 11.0* 10.5 11.9* 10.7*  NEUTROABS  --  9.4*  --   --  9.0*  HGB 13.1 11.0* 9.8* 9.6* 8.9*  HCT 41.1 33.6* 28.6* 29.9* 27.2*  MCV 97.4 96.6 95.7 96.5 96.1  PLT 272 239 234 231 207   Cardiac Enzymes: No results for input(s): CKTOTAL, CKMB, CKMBINDEX, TROPONINI in the last 168 hours. BNP (last 3 results) No results for input(s): PROBNP in the last 8760 hours. CBG: No results for input(s): GLUCAP in the last 168 hours.  No results found for this or any previous visit (from the past 240 hour(s)).    Studies: No results  found.   Scheduled Meds: . allopurinol  200 mg Oral Daily  . artificial tears  1 application Both Eyes QHS  . azelastine  1 spray Each Nare Daily   And  . fluticasone  1 spray Each Nare Daily  . cetirizine  10 mg Oral QHS  . Chlorhexidine Gluconate Cloth  6 each Topical Q0600  . Chlorhexidine Gluconate Cloth  6 each Topical Q0600  . dronabinol  2.5 mg Oral BID AC  . fentaNYL  1 patch Transdermal Q72H  . furosemide  80 mg Oral BID  . heparin sodium (porcine)  1,000 Units Intravenous Q T,Th,Sa-HD  . levothyroxine  125 mcg Oral Daily  . megestrol  40 mg Oral Daily  . metoCLOPramide  5 mg Oral TID AC & HS  . midodrine  5 mg Oral TID WC  . nystatin  5 mL Oral QID  . polyvinyl alcohol  1 drop Both Eyes TID  . predniSONE  5 mg Oral Q breakfast  . Tacrolimus ER  1.5 mg Oral Daily  . traZODone  100 mg Oral QHS  . warfarin  5 mg Oral q1600  . Warfarin - Pharmacist Dosing Inpatient   Does not apply q1600   Continuous Infusions: . sodium chloride     . heparin 1,350 Units/hr (01-11-21 1125)    Principal Problem:   Metastatic urothelial carcinoma ([place]) Active Problems:   SYSTOLIC HEART FAILURE, CHRONIC   Acute kidney injury superimposed on CKD (HCC)   Hypothyroidism   Hyponatremia   Back pain   Retroperitoneal lymphadenopathy   Liver lesion   Acute on chronic combined systolic and diastolic CHF (congestive heart failure) (HCC)   Acute DVT of right tibial vein (H[facility])   Retroperitoneal hemorrhage   Neoplasm related pain   Encounter for antineoplastic chemotherapy   Abdominal distension   Pressure injury of skin     Joan Avetisyan, [facility]  If 7PM-7AM, please contact night-coverage [email or web address] 01-11-2021, 11:37 AM    LOS: 25 days

## 2021-01-29 NOTE — Progress Notes (Signed)
Renal Navigator following and awaiting medical readiness and disposition plan prior to referral for outpatient HD. Navigator notes hallucinations overnight.   Alphonzo Cruise, [place] Renal Navigator [phone]

## 2021-01-29 NOTE — Progress Notes (Signed)
Received a message from Dr. [place]/nephrologist that patient has expressed his wishes to stop dialysis.  Family wants unrestricted visitations.  Sent a message to palliative care to see this patient however they are unable to see him today.  Had another phone conversation with patient's wife Lattie Haw over the phone.  She informed me that patient and family has decided to stop dialysis and stop aggressive measures and they would want to transition to full comfort care and would like understated visitations.  I have informed TOC as well as patient's primary nurse.  Transitioning to full comfort care and discontinue unnecessary medications.  No more vital checks.  Pain medications on board that patient can use in order to keep him comfortable.

## 2021-01-29 NOTE — Progress Notes (Signed)
[facility] Progress Note     Assessment/ Plan:   1. Dialysis Dependent AKI on CKD 3a - b/l creat 1.2- 1.4 from oct 2021.    Creat 1.8 on admission > up to 2.3 then 2.8 after lasix tried, then 3.1 >  3.2 > 3.4 > up to 4.5 -- HD.  He likely progressed due to ATN  IR placed [facility] 1/17 R IJ, HD #1 1/17  HD #3 1/21: 3hr, intra-dialytic hypotension therefore order midorine 10 mg and albumin 25 g. UF as tolerated.  HD #4 1/24 again limited by hypotension limiting UF despite midodrine and albumin support.  1/27 tolerated UF 2.6L; 1/29 tolerated UF 1.5L in chair, midodrine support, no albumin since not available outpt  Pre HD SBP in the 60's and 70's-  Told him that dialysis was dangerous with BP that low.  He then tells me that he has been thinking, had no idea he was this bad, does not wish for further HD-  This is appropriate given the situation   2. High-grade bladder cancer-  Has started chemotherapy (palliative) with Dr. Irene Limbo- dont know what the future is for that  3. H/o Hodgkin's lymphoma - remote 4. H/o cardiac transplant - taking prograf 1.5 mg /d and pred 5 /d per CV 5. Acute RLE DVT - per primary team. Positive for superficial venous thrombosis/thrombophlebitis of the mid right leg greater saphenous vein but neg for DVT. On heparin gtt and going to coumadin   6. Hypotension:  Modest hypotension which is being managed with midodrine.  Adrenal function ok. 7. Anemia:  Hb trending down past few days.  No obvious bleeding.  No ESA with malignancy.  Transfused > 7.     Dispo - He's certainly very deconditioned with an incurable metastatic malignancy receiving palliative chemotherapy.    Palliative care is following.  Last week he changed from DNR to full code now back to DNR and wishes for no more HD-  I suspect this would put his life expectancy at days to weeks.     Subjective:   Events noted-  Was confused last night- called 911-  Wife is here today -  Has changed  back to a DNR-  I came to talk to him because BP is too low to safely do dialysis-  He says that he is sorry for using resources and does not want to do any more dialysis-  He is more understanding of his situation      Objective:   BP (!) 78/57 (BP Location: Right Arm)   Pulse (!) 113   Temp 98.2 F (36.8 C) (Oral)   Resp 18   Ht 6\' 2"  (1.88 m)   Wt 98.4 kg   SpO2 94%   BMI 27.86 kg/m   Intake/Output Summary (Last 24 hours) at Jan 04, 2021 1248 Last data filed at Jan 04, 2021 0630 Gross per 24 hour  Intake 749.68 ml  Output --  Net 749.68 ml   Weight change:   Physical Exam: Genalert, NAD No jvd or bruits Chest clear bilatto bases RRR no MRG Abdprotuberant, no ascites by exam,  +BS Ext3+ pitting bilat LE from feet to hips to abd/chest wall-  Feet in boots  Neuro is alert and conversant TDC site clean.   Imaging: No results found.  Labs: BMET Recent Labs  Lab 12/26/20 0429 12/28/20 0417 12/29/20 0516 12/30/20 0436  NA 132* 133* 132* 135  K 5.3* 4.2 4.6 3.7  CL 96* 97* 97* 100  CO2 19* 20* 20* 22  GLUCOSE 94 95 118* 118*  BUN 87* 61* 81* 60*  CREATININE 3.83* 4.17* 5.17* 4.11*  CALCIUM 8.9 8.3* 8.4* 8.2*   CBC Recent Labs  Lab 12/28/20 0417 12/29/20 0516 12/30/20 0436 12/31/20 0116  WBC 11.0* 10.5 11.9* 10.7*  NEUTROABS 9.4*  --   --  9.0*  HGB 11.0* 9.8* 9.6* 8.9*  HCT 33.6* 28.6* 29.9* 27.2*  MCV 96.6 95.7 96.5 96.1  PLT 239 234 231 207    Medications:    . allopurinol  200 mg Oral Daily  . artificial tears  1 application Both Eyes QHS  . azelastine  1 spray Each Nare Daily   And  . fluticasone  1 spray Each Nare Daily  . cetirizine  10 mg Oral QHS  . Chlorhexidine Gluconate Cloth  6 each Topical Q0600  . Chlorhexidine Gluconate Cloth  6 each Topical Q0600  . dronabinol  2.5 mg Oral BID AC  . fentaNYL  1 patch Transdermal Q72H  . furosemide  80 mg Oral BID  . heparin sodium (porcine)  1,000 Units Intravenous Q T,Th,Sa-HD  . levothyroxine   125 mcg Oral Daily  . megestrol  40 mg Oral Daily  . metoCLOPramide  5 mg Oral TID AC & HS  . midodrine  5 mg Oral TID WC  . nystatin  5 mL Oral QID  . polyvinyl alcohol  1 drop Both Eyes TID  . predniSONE  5 mg Oral Q breakfast  . Tacrolimus ER  1.5 mg Oral Daily  . traZODone  100 mg Oral QHS  . warfarin  5 mg Oral q1600  . Warfarin - Pharmacist Dosing Inpatient   Does not apply [place], MD  01/14/2021, 12:48 PM

## 2021-01-29 DEATH — deceased

## 2021-03-01 ENCOUNTER — Other Ambulatory Visit: Payer: 59

## 2021-03-01 ENCOUNTER — Ambulatory Visit: Payer: 59 | Admitting: Hematology

## 2021-04-18 ENCOUNTER — Encounter: Payer: Self-pay | Admitting: Hematology
